# Patient Record
Sex: Male | Born: 1940 | Race: White | Hispanic: No | Marital: Married | Smoking: Never smoker
Health system: Southern US, Community
[De-identification: ages and names within clinical notes are randomized; demographics above are authoritative.]

## PROBLEM LIST (undated history)

## (undated) DIAGNOSIS — F32A Depression, unspecified: Secondary | ICD-10-CM

## (undated) DIAGNOSIS — M1990 Unspecified osteoarthritis, unspecified site: Secondary | ICD-10-CM

## (undated) DIAGNOSIS — R972 Elevated prostate specific antigen [PSA]: Secondary | ICD-10-CM

## (undated) DIAGNOSIS — Z973 Presence of spectacles and contact lenses: Secondary | ICD-10-CM

## (undated) DIAGNOSIS — C801 Malignant (primary) neoplasm, unspecified: Secondary | ICD-10-CM

## (undated) DIAGNOSIS — U071 COVID-19: Secondary | ICD-10-CM

## (undated) DIAGNOSIS — I493 Ventricular premature depolarization: Secondary | ICD-10-CM

## (undated) DIAGNOSIS — R319 Hematuria, unspecified: Secondary | ICD-10-CM

## (undated) DIAGNOSIS — D862 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: Secondary | ICD-10-CM

## (undated) DIAGNOSIS — A419 Sepsis, unspecified organism: Secondary | ICD-10-CM

## (undated) DIAGNOSIS — K219 Gastro-esophageal reflux disease without esophagitis: Secondary | ICD-10-CM

## (undated) DIAGNOSIS — J309 Allergic rhinitis, unspecified: Secondary | ICD-10-CM

## (undated) DIAGNOSIS — E785 Hyperlipidemia, unspecified: Secondary | ICD-10-CM

## (undated) DIAGNOSIS — Z9889 Other specified postprocedural states: Secondary | ICD-10-CM

## (undated) DIAGNOSIS — N401 Enlarged prostate with lower urinary tract symptoms: Secondary | ICD-10-CM

## (undated) DIAGNOSIS — M51369 Other intervertebral disc degeneration, lumbar region without mention of lumbar back pain or lower extremity pain: Secondary | ICD-10-CM

## (undated) DIAGNOSIS — J45909 Unspecified asthma, uncomplicated: Secondary | ICD-10-CM

## (undated) DIAGNOSIS — H40001 Preglaucoma, unspecified, right eye: Secondary | ICD-10-CM

## (undated) DIAGNOSIS — Z85820 Personal history of malignant melanoma of skin: Secondary | ICD-10-CM

## (undated) DIAGNOSIS — N390 Urinary tract infection, site not specified: Secondary | ICD-10-CM

## (undated) DIAGNOSIS — R7303 Prediabetes: Secondary | ICD-10-CM

## (undated) DIAGNOSIS — Z974 Presence of external hearing-aid: Secondary | ICD-10-CM

## (undated) DIAGNOSIS — R0989 Other specified symptoms and signs involving the circulatory and respiratory systems: Secondary | ICD-10-CM

## (undated) DIAGNOSIS — N529 Male erectile dysfunction, unspecified: Secondary | ICD-10-CM

## (undated) DIAGNOSIS — Z859 Personal history of malignant neoplasm, unspecified: Secondary | ICD-10-CM

## (undated) DIAGNOSIS — H903 Sensorineural hearing loss, bilateral: Secondary | ICD-10-CM

## (undated) DIAGNOSIS — M5136 Other intervertebral disc degeneration, lumbar region: Secondary | ICD-10-CM

## (undated) DIAGNOSIS — D494 Neoplasm of unspecified behavior of bladder: Secondary | ICD-10-CM

## (undated) DIAGNOSIS — N201 Calculus of ureter: Secondary | ICD-10-CM

## (undated) DIAGNOSIS — Z8709 Personal history of other diseases of the respiratory system: Secondary | ICD-10-CM

## (undated) DIAGNOSIS — R6889 Other general symptoms and signs: Secondary | ICD-10-CM

## (undated) DIAGNOSIS — F329 Major depressive disorder, single episode, unspecified: Secondary | ICD-10-CM

## (undated) HISTORY — PX: TONSILLECTOMY AND ADENOIDECTOMY: SUR1326

## (undated) HISTORY — DX: Hyperlipidemia, unspecified: E78.5

## (undated) HISTORY — PX: MOHS SURGERY: SUR867

## (undated) HISTORY — PX: ILEOSTOMY: SHX1783

## (undated) HISTORY — DX: Sepsis, unspecified organism: A41.9

## (undated) HISTORY — DX: Unspecified asthma, uncomplicated: J45.909

## (undated) HISTORY — PX: DEEP NECK LYMPH NODE BIOPSY / EXCISION: SUR126

## (undated) HISTORY — DX: Urinary tract infection, site not specified: N39.0

## (undated) HISTORY — PX: TRANSURETHRAL RESECTION OF PROSTATE: SHX73

---

## 2000-12-09 ENCOUNTER — Ambulatory Visit (HOSPITAL_BASED_OUTPATIENT_CLINIC_OR_DEPARTMENT_OTHER): Admission: RE | Admit: 2000-12-09 | Discharge: 2000-12-09 | Payer: Self-pay | Admitting: Orthopedic Surgery

## 2004-09-29 ENCOUNTER — Ambulatory Visit: Payer: Self-pay | Admitting: Internal Medicine

## 2004-10-02 ENCOUNTER — Ambulatory Visit: Payer: Self-pay | Admitting: Internal Medicine

## 2004-10-27 ENCOUNTER — Ambulatory Visit: Payer: Self-pay | Admitting: Internal Medicine

## 2004-11-10 ENCOUNTER — Encounter: Admission: RE | Admit: 2004-11-10 | Discharge: 2005-02-08 | Payer: Self-pay | Admitting: Internal Medicine

## 2005-01-05 ENCOUNTER — Ambulatory Visit: Payer: Self-pay | Admitting: Internal Medicine

## 2005-01-20 ENCOUNTER — Ambulatory Visit: Payer: Self-pay | Admitting: Internal Medicine

## 2005-02-13 ENCOUNTER — Ambulatory Visit: Payer: Self-pay | Admitting: Internal Medicine

## 2005-03-02 HISTORY — PX: PARS PLANA VITRECTOMY: SHX2166

## 2005-07-21 ENCOUNTER — Ambulatory Visit: Payer: Self-pay | Admitting: Internal Medicine

## 2006-03-02 HISTORY — PX: APPENDECTOMY: SHX54

## 2006-03-09 ENCOUNTER — Ambulatory Visit: Payer: Self-pay | Admitting: Internal Medicine

## 2006-06-29 ENCOUNTER — Ambulatory Visit: Payer: Self-pay | Admitting: Internal Medicine

## 2006-06-29 LAB — CONVERTED CEMR LAB
ALT: 25 units/L (ref 0–40)
AST: 22 units/L (ref 0–37)
Cholesterol: 159 mg/dL (ref 0–200)
Creatinine,U: 75.4 mg/dL
Hgb A1c MFr Bld: 5.7 % (ref 4.6–6.0)
Microalb, Ur: 0.3 mg/dL (ref 0.0–1.9)
Triglycerides: 114 mg/dL (ref 0–149)

## 2006-07-05 DIAGNOSIS — D869 Sarcoidosis, unspecified: Secondary | ICD-10-CM

## 2006-07-06 ENCOUNTER — Ambulatory Visit: Payer: Self-pay | Admitting: Internal Medicine

## 2006-09-28 ENCOUNTER — Ambulatory Visit: Payer: Self-pay | Admitting: Internal Medicine

## 2006-09-28 LAB — CONVERTED CEMR LAB
ALT: 42 units/L (ref 0–53)
HDL: 39.6 mg/dL (ref >39.0)
VLDL: 21 mg/dL (ref 0–40)

## 2006-10-05 ENCOUNTER — Ambulatory Visit: Payer: Self-pay | Admitting: Internal Medicine

## 2006-10-05 DIAGNOSIS — E7800 Pure hypercholesterolemia, unspecified: Secondary | ICD-10-CM | POA: Insufficient documentation

## 2006-10-05 DIAGNOSIS — E785 Hyperlipidemia, unspecified: Secondary | ICD-10-CM

## 2006-10-05 LAB — CONVERTED CEMR LAB
Cholesterol, target level: 200 mg/dL
LDL Goal: 160 mg/dL

## 2006-10-28 ENCOUNTER — Telehealth: Payer: Self-pay | Admitting: Internal Medicine

## 2006-11-12 ENCOUNTER — Encounter: Payer: Self-pay | Admitting: Internal Medicine

## 2006-12-30 ENCOUNTER — Telehealth (INDEPENDENT_AMBULATORY_CARE_PROVIDER_SITE_OTHER): Payer: Self-pay | Admitting: *Deleted

## 2007-02-03 ENCOUNTER — Ambulatory Visit: Payer: Self-pay | Admitting: Internal Medicine

## 2007-04-04 ENCOUNTER — Encounter: Payer: Self-pay | Admitting: Internal Medicine

## 2007-04-11 ENCOUNTER — Ambulatory Visit: Payer: Self-pay | Admitting: Internal Medicine

## 2007-04-11 LAB — CONVERTED CEMR LAB
ALT: 23 units/L (ref 0–53)
VLDL: 19 mg/dL (ref 0–40)

## 2007-04-18 ENCOUNTER — Ambulatory Visit: Payer: Self-pay | Admitting: Internal Medicine

## 2007-04-18 DIAGNOSIS — I776 Arteritis, unspecified: Secondary | ICD-10-CM | POA: Insufficient documentation

## 2007-04-18 LAB — CONVERTED CEMR LAB: LDL Goal: 130 mg/dL

## 2007-04-19 ENCOUNTER — Encounter: Payer: Self-pay | Admitting: Internal Medicine

## 2007-04-19 ENCOUNTER — Ambulatory Visit: Payer: Self-pay | Admitting: Family Medicine

## 2007-04-21 ENCOUNTER — Encounter (INDEPENDENT_AMBULATORY_CARE_PROVIDER_SITE_OTHER): Payer: Self-pay | Admitting: *Deleted

## 2007-04-21 ENCOUNTER — Ambulatory Visit: Payer: Self-pay | Admitting: Internal Medicine

## 2007-04-21 LAB — CONVERTED CEMR LAB
Eosinophils Absolute: 0.2 10*3/uL (ref 0.0–0.6)
Eosinophils Relative: 3.2 % (ref 0.0–5.0)
HCT: 41.3 % (ref 39.0–52.0)
MCV: 91.2 fL (ref 78.0–100.0)
Neutrophils Relative %: 63.3 % (ref 43.0–77.0)
RBC: 4.53 M/uL (ref 4.22–5.81)
WBC: 7.6 10*3/uL (ref 4.5–10.5)

## 2007-04-22 ENCOUNTER — Encounter: Payer: Self-pay | Admitting: Internal Medicine

## 2007-05-03 ENCOUNTER — Encounter (INDEPENDENT_AMBULATORY_CARE_PROVIDER_SITE_OTHER): Payer: Self-pay | Admitting: *Deleted

## 2007-05-06 ENCOUNTER — Encounter: Payer: Self-pay | Admitting: Internal Medicine

## 2007-06-02 ENCOUNTER — Ambulatory Visit: Payer: Self-pay | Admitting: Internal Medicine

## 2007-07-20 ENCOUNTER — Telehealth: Payer: Self-pay | Admitting: Internal Medicine

## 2007-08-30 ENCOUNTER — Encounter: Payer: Self-pay | Admitting: Internal Medicine

## 2007-09-27 ENCOUNTER — Encounter: Payer: Self-pay | Admitting: Internal Medicine

## 2007-12-21 ENCOUNTER — Ambulatory Visit: Payer: Self-pay | Admitting: Internal Medicine

## 2008-01-02 ENCOUNTER — Telehealth (INDEPENDENT_AMBULATORY_CARE_PROVIDER_SITE_OTHER): Payer: Self-pay | Admitting: *Deleted

## 2008-04-02 ENCOUNTER — Encounter (INDEPENDENT_AMBULATORY_CARE_PROVIDER_SITE_OTHER): Payer: Self-pay | Admitting: *Deleted

## 2008-04-12 ENCOUNTER — Ambulatory Visit: Payer: Self-pay | Admitting: Internal Medicine

## 2008-04-12 DIAGNOSIS — K1370 Unspecified lesions of oral mucosa: Secondary | ICD-10-CM

## 2008-04-12 DIAGNOSIS — F329 Major depressive disorder, single episode, unspecified: Secondary | ICD-10-CM

## 2008-04-12 DIAGNOSIS — R972 Elevated prostate specific antigen [PSA]: Secondary | ICD-10-CM

## 2008-04-12 DIAGNOSIS — J309 Allergic rhinitis, unspecified: Secondary | ICD-10-CM

## 2008-04-12 LAB — CONVERTED CEMR LAB
HDL goal, serum: 40 mg/dL
LDL Goal: 90 mg/dL

## 2008-04-13 ENCOUNTER — Encounter: Payer: Self-pay | Admitting: Internal Medicine

## 2008-04-19 ENCOUNTER — Ambulatory Visit: Payer: Self-pay | Admitting: Internal Medicine

## 2008-04-27 ENCOUNTER — Ambulatory Visit: Payer: Self-pay | Admitting: Internal Medicine

## 2008-04-27 LAB — CONVERTED CEMR LAB: OCCULT 1: NEGATIVE

## 2008-05-01 ENCOUNTER — Encounter (INDEPENDENT_AMBULATORY_CARE_PROVIDER_SITE_OTHER): Payer: Self-pay | Admitting: *Deleted

## 2008-05-07 LAB — CONVERTED CEMR LAB
Albumin: 3.8 g/dL (ref 3.5–5.2)
BUN: 16 mg/dL (ref 6–23)
Calcium: 9.4 mg/dL (ref 8.4–10.5)
Chloride: 107 meq/L (ref 96–112)
Cholesterol: 157 mg/dL (ref 0–200)
Creatinine, Ser: 0.8 mg/dL (ref 0.4–1.5)
GFR calc Af Amer: 124 mL/min
Glucose, Bld: 95 mg/dL (ref 70–99)
HDL: 40.7 mg/dL (ref >39.0)
Total CHOL/HDL Ratio: 3.9
Total Protein: 6.8 g/dL (ref 6.0–8.3)
Triglycerides: 143 mg/dL (ref 0–149)

## 2008-05-08 ENCOUNTER — Encounter (INDEPENDENT_AMBULATORY_CARE_PROVIDER_SITE_OTHER): Payer: Self-pay | Admitting: *Deleted

## 2008-05-22 ENCOUNTER — Ambulatory Visit: Payer: Self-pay | Admitting: Internal Medicine

## 2008-05-22 ENCOUNTER — Telehealth (INDEPENDENT_AMBULATORY_CARE_PROVIDER_SITE_OTHER): Payer: Self-pay | Admitting: *Deleted

## 2008-05-22 DIAGNOSIS — R0989 Other specified symptoms and signs involving the circulatory and respiratory systems: Secondary | ICD-10-CM

## 2008-05-22 DIAGNOSIS — R0609 Other forms of dyspnea: Secondary | ICD-10-CM

## 2008-05-25 ENCOUNTER — Ambulatory Visit: Payer: Self-pay | Admitting: Internal Medicine

## 2008-05-28 ENCOUNTER — Encounter (INDEPENDENT_AMBULATORY_CARE_PROVIDER_SITE_OTHER): Payer: Self-pay | Admitting: *Deleted

## 2008-06-05 ENCOUNTER — Encounter: Payer: Self-pay | Admitting: Internal Medicine

## 2008-06-05 ENCOUNTER — Ambulatory Visit: Payer: Self-pay | Admitting: Internal Medicine

## 2008-06-12 ENCOUNTER — Encounter (INDEPENDENT_AMBULATORY_CARE_PROVIDER_SITE_OTHER): Payer: Self-pay | Admitting: *Deleted

## 2008-06-12 ENCOUNTER — Telehealth (INDEPENDENT_AMBULATORY_CARE_PROVIDER_SITE_OTHER): Payer: Self-pay | Admitting: *Deleted

## 2008-07-02 ENCOUNTER — Encounter: Payer: Self-pay | Admitting: Internal Medicine

## 2008-08-02 ENCOUNTER — Telehealth: Payer: Self-pay | Admitting: Internal Medicine

## 2009-01-17 ENCOUNTER — Telehealth (INDEPENDENT_AMBULATORY_CARE_PROVIDER_SITE_OTHER): Payer: Self-pay | Admitting: *Deleted

## 2009-01-28 ENCOUNTER — Telehealth (INDEPENDENT_AMBULATORY_CARE_PROVIDER_SITE_OTHER): Payer: Self-pay | Admitting: *Deleted

## 2009-02-13 ENCOUNTER — Telehealth (INDEPENDENT_AMBULATORY_CARE_PROVIDER_SITE_OTHER): Payer: Self-pay | Admitting: *Deleted

## 2009-02-21 ENCOUNTER — Telehealth (INDEPENDENT_AMBULATORY_CARE_PROVIDER_SITE_OTHER): Payer: Self-pay | Admitting: *Deleted

## 2009-02-27 ENCOUNTER — Telehealth (INDEPENDENT_AMBULATORY_CARE_PROVIDER_SITE_OTHER): Payer: Self-pay | Admitting: *Deleted

## 2009-07-11 ENCOUNTER — Ambulatory Visit: Payer: Self-pay | Admitting: Internal Medicine

## 2009-07-11 LAB — CONVERTED CEMR LAB
BUN: 18 mg/dL (ref 6–23)
Basophils Relative: 0.8 % (ref 0.0–3.0)
Bilirubin, Direct: 0.1 mg/dL (ref 0.0–0.3)
CO2: 27 meq/L (ref 19–32)
Chloride: 109 meq/L (ref 96–112)
Cholesterol: 135 mg/dL (ref 0–200)
Creatinine, Ser: 0.7 mg/dL (ref 0.4–1.5)
Eosinophils Absolute: 0.3 10*3/uL (ref 0.0–0.7)
Eosinophils Relative: 3.3 % (ref 0.0–5.0)
Glucose, Bld: 100 mg/dL — ABNORMAL HIGH (ref 70–99)
HDL: 43.2 mg/dL (ref >39.00)
Hemoglobin: 13.4 g/dL (ref 13.0–17.0)
LDL Cholesterol: 68 mg/dL (ref 0–99)
Lymphocytes Relative: 35.8 % (ref 12.0–46.0)
MCHC: 34.4 g/dL (ref 30.0–36.0)
Neutro Abs: 3.9 10*3/uL (ref 1.4–7.7)
Potassium: 4.2 meq/L (ref 3.5–5.1)
Protein, U semiquant: NEGATIVE
RBC: 4.26 M/uL (ref 4.22–5.81)
TSH: 2.4 microintl units/mL (ref 0.35–5.50)
Total Bilirubin: 0.6 mg/dL (ref 0.3–1.2)
Total Protein: 6.6 g/dL (ref 6.0–8.3)
Urobilinogen, UA: 0.2
VLDL: 23.8 mg/dL (ref 0.0–40.0)
WBC Urine, dipstick: NEGATIVE
WBC: 7.5 10*3/uL (ref 4.5–10.5)

## 2009-07-18 ENCOUNTER — Encounter: Payer: Self-pay | Admitting: Internal Medicine

## 2009-07-18 ENCOUNTER — Ambulatory Visit: Payer: Self-pay | Admitting: Internal Medicine

## 2009-07-18 DIAGNOSIS — M7580 Other shoulder lesions, unspecified shoulder: Secondary | ICD-10-CM

## 2009-09-11 ENCOUNTER — Encounter: Payer: Self-pay | Admitting: Internal Medicine

## 2009-10-22 ENCOUNTER — Encounter: Payer: Self-pay | Admitting: Internal Medicine

## 2009-12-05 ENCOUNTER — Ambulatory Visit: Payer: Self-pay | Admitting: Internal Medicine

## 2009-12-06 ENCOUNTER — Ambulatory Visit: Payer: Self-pay | Admitting: Internal Medicine

## 2009-12-06 DIAGNOSIS — M549 Dorsalgia, unspecified: Secondary | ICD-10-CM | POA: Insufficient documentation

## 2009-12-30 ENCOUNTER — Ambulatory Visit: Payer: Self-pay | Admitting: Internal Medicine

## 2009-12-30 DIAGNOSIS — N423 Unspecified dysplasia of prostate: Secondary | ICD-10-CM | POA: Insufficient documentation

## 2009-12-31 ENCOUNTER — Ambulatory Visit: Payer: Self-pay | Admitting: Internal Medicine

## 2010-01-03 ENCOUNTER — Telehealth: Payer: Self-pay | Admitting: Internal Medicine

## 2010-02-03 ENCOUNTER — Telehealth: Payer: Self-pay | Admitting: Internal Medicine

## 2010-02-05 ENCOUNTER — Encounter
Admission: RE | Admit: 2010-02-05 | Discharge: 2010-02-05 | Payer: Self-pay | Source: Home / Self Care | Admitting: Internal Medicine

## 2010-02-06 ENCOUNTER — Encounter: Payer: Self-pay | Admitting: Internal Medicine

## 2010-02-06 DIAGNOSIS — M5137 Other intervertebral disc degeneration, lumbosacral region: Secondary | ICD-10-CM

## 2010-02-06 DIAGNOSIS — M51379 Other intervertebral disc degeneration, lumbosacral region without mention of lumbar back pain or lower extremity pain: Secondary | ICD-10-CM | POA: Insufficient documentation

## 2010-02-21 ENCOUNTER — Emergency Department (HOSPITAL_COMMUNITY)
Admission: EM | Admit: 2010-02-21 | Discharge: 2010-02-21 | Payer: Self-pay | Source: Home / Self Care | Admitting: Emergency Medicine

## 2010-02-25 ENCOUNTER — Telehealth: Payer: Self-pay | Admitting: Internal Medicine

## 2010-03-24 ENCOUNTER — Encounter: Payer: Self-pay | Admitting: Internal Medicine

## 2010-03-28 ENCOUNTER — Telehealth: Payer: Self-pay | Admitting: Internal Medicine

## 2010-04-01 NOTE — Miscellaneous (Signed)
Summary: Orders Update  Clinical Lists Changes  Problems: Added new problem of DISC DISEASE, LUMBOSACRAL SPINE (ICD-722.52) - Signed Orders: Added new Referral order of Neurosurgeon Referral (Neurosurgeon) - Signed

## 2010-04-01 NOTE — Letter (Signed)
Summary: [facility]  [facility]   Imported By: Lennie Odor 09/26/2009 11:34:14  _____________________________________________________________________  External Attachment:    Type:   Image     Comment:   External Document

## 2010-04-01 NOTE — Progress Notes (Signed)
Summary: FYI-reaction to med  Phone Note Call from Patient Call back at Work Phone [phone]   Caller: Patient Summary of Call: Spoke w/ patient says that since starting gabapentin he started feeling flushed, having some vision changes, and dizziness. Notes that he didn't take medication today and feels fine says he will just stick w/ ibruprofen since this is the second medication that he has tried and it didn't agree w/ him. Says that if Hop recommends anything else to let him know otherwise he will stick w/ ibruprofen. Initial call taken by: Doristine Devoid CMA,  January 03, 2010 2:52 PM  Follow-up for Phone Call        additional studies if pain persists or progresses Follow-up by: Marga Melnick MD,  January 03, 2010 5:42 PM  Additional Follow-up for Phone Call Additional follow up Details #1::        Patient notified of the above and states that he really is not improving. Patient will wait awhile longer and call back if needed.   Additional studies denied at this time. Additional Follow-up by: Lucious Groves CMA,  January 06, 2010 11:57 AM   New Allergies: ! * GABAPENTIN New Allergies: ! * GABAPENTIN

## 2010-04-01 NOTE — Progress Notes (Signed)
Summary: Sciatica pain  Phone Note Call from Patient Call back at Work Phone [phone]   Summary of Call: Patient called noting that he is still suffering from Sciatica. He still has pain in both his back and hip. He rates the pain as 3 of 10 b/c he has already taken some Ibuprofen this AM. Patient has done online research and would like to know if he is a candidate for Cortisone shots. Please advise. Initial call taken by: Lucious Groves CMA,  February 03, 2010 9:05 AM  Follow-up for Phone Call        MRI needed Follow-up by: Marga Melnick MD,  February 03, 2010 12:58 PM  Additional Follow-up for Phone Call Additional follow up Details #1::        PATIENT APPT IS 02-05-2010, I WILL INFORM PATIENT. Magdalen Spatz [facility]  February 03, 2010 4:45 PM

## 2010-04-01 NOTE — Assessment & Plan Note (Signed)
Summary: back pain generating down leg/cbs   Vital Signs:  Patient profile:   70 year old male Weight:      173 pounds BMI:     26.79 Temp:     98.5 degrees F oral Pulse rate:   76 / minute Resp:     15 per minute BP sitting:   106 / 64  (left arm) Cuff size:   large  Vitals Entered By: Shonna Chock CMA (December 30, 2009 3:37 PM) CC: Ongoing back pain, Back pain   CC:  Ongoing back pain and Back pain.  History of Present Illness: Back Pain      This is a 70 year old man who presents with Back pain present > 1 month. See last office visit (reviewed); muscle relaxant "made me a zombie ".No narcotic taken ; all steroids completed. . Pain is  constant , 3 -6 on 10 scale . The patient reports  associated weakness LLE , but denies fever, chills, loss of sensation, fecal incontinence, urinary incontinence, and urinary retention.  The pain is located in the left buttock & the  pain radiates to the  popliteal area.  The pain is made worse by sitting .  The pain is made better by supine & standing positions.  Risk factors for serious underlying conditions include duration of pain > 1 month and age >= 50 years.  Rx: NSAIDS with some benefit.  Current Medications (verified): 1)  Finasteride 5 Mg  Tabs (Finasteride) .Marland Kitchen.. 1 By Mouth Qd 2)  Singulair 10 Mg Tabs (Montelukast Sodium) .Marland Kitchen.. 1 By Mouth Qd 3)  Advair Diskus 250-50 Mcg/dose  Misc (Fluticasone-Salmeterol) .Marland Kitchen.. 1 Puff Once Daily Every 12 Hrs 4)  Venlafaxine Hcl 75 Mg Tabs (Venlafaxine Hcl) .... Take One Tablet Daily 5)  Pravastatin Sodium 40 Mg  Tabs (Pravastatin Sodium) .Marland Kitchen.. 1 By Mouth Qd 6)  Ventolin Hfa 108 (90 Base) Mcg/act Aers (Albuterol Sulfate) .Marland Kitchen.. 1-2 Puffs Every 4-6 Hrs As Needed For Dyspnea 7)  Vicodin 5-500 Mg Tabs (Hydrocodone-Acetaminophen) .... One By Mouth Every 4 Hours As Needed For Pain 8)  Flexeril 10 Mg Tabs (Cyclobenzaprine Hcl) .... One By Mouth At Bedtime As Needed For Pain  Allergies: 1)  ! Iodine 2)  ! Advicor  (Niacin-Lovastatin) 3)  ! 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 No AAA Msk:  He lay down & sat up w/o help Pulses:  R and L dorsalis pedis and posterior tibial pulses are full and equal bilaterally Extremities:  No clubbing, cyanosis, edema; no ischemic changes. Neg SLR Neurologic:  strength normal in all extremities,  heel & toe gait normal, and DTRs symmetrical and normal.   Skin:  Intact without suspicious lesions or rashes Psych:  memory intact for recent and remote, normally interactive, and good eye contact.     Impression & Recommendations:  Problem # 1:  BACK PAIN (ICD-724.5)  S1 radiculopathy suggested His updated medication list for this problem includes:    Vicodin 5-500 Mg Tabs (Hydrocodone-acetaminophen) ..... 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 1 by mouth qd 2)  Singulair 10 Mg Tabs (Montelukast sodium) .Marland Kitchen.. 1 by mouth qd 3)  Advair Diskus 250-50 Mcg/dose Misc (Fluticasone-salmeterol) .Marland Kitchen.. 1 puff once daily every 12 hrs 4)  Venlafaxine Hcl 75 Mg Tabs (Venlafaxine hcl) .... Take one tablet daily 5)  Pravastatin Sodium 40 Mg Tabs (Pravastatin sodium) .Marland Kitchen.. 1 by mouth  qd 6)  Ventolin Hfa 108 (90 Base) Mcg/act Aers (Albuterol sulfate) .Marland Kitchen.. 1-2 puffs every 4-6 hrs as needed for dyspnea 7)  Vicodin 5-500 Mg Tabs (Hydrocodone-acetaminophen) .... One by mouth every 4 hours as needed for pain 8)  Flexeril 10 Mg Tabs (Cyclobenzaprine hcl) .... One by mouth at bedtime as needed for pain 9)  Gabapentin 100 Mg Caps (Gabapentin) .Marland Kitchen.. 1 every 8 hrs as needed for leg pain  Patient Instructions: 1)  [facility] @ [facility] office.  Prescriptions: GABAPENTIN 100 MG CAPS (GABAPENTIN) 1 every 8 hrs as needed for leg pain  #30 x 2   Entered and Authorized by:   Marga Melnick MD   Signed by:   Marga Melnick MD on 12/30/2009   Method used:   Faxed to ...       [facility]  [address] [phone]* (retail)       [address]       [place], [place]  [address]       Ph: [phone]       Fax: [phone]   RxID:   [id number]    Orders Added: 1)  Est. Patient Level III [42706] 2)  T-Lumbar Spine w/Flex & Ext 4 Views [72120TC] 3)  Prescription Created Electronically [phone]

## 2010-04-01 NOTE — Assessment & Plan Note (Signed)
Summary: hip pain/cbs   Vital Signs:  Patient profile:   70 year old male Weight:      173 pounds Pulse rate:   78 / minute Pulse rhythm:   regular BP sitting:   116 / 70  (left arm) Cuff size:   large  Vitals Entered By: Army Fossa CMA (December 06, 2009 10:26 AM) CC: Pt here c/o hip pain that radiates down his leg.  Comments Tingling feeling x 8-10 days    History of Present Illness: 8-10 days history of lower back pain with radiation to the left buttock Pain is worse if he sits for prolonged periods of time On an off he has a tingly feeling in the whole left leg Ibuprofen helps to some extent, symptoms are worse early in the morning.  ROS Denies fevers No recent injury, no history of previous surgery in that area No bladder or bowel incontinence No rash in the back or leg He has a history of an abnormal prostate biopsy, his PSA is checked every 6 months and the last time it was checked he was told it was okay denies abdominal pain  Current Medications (verified): 1)  Finasteride 5 Mg  Tabs (Finasteride) .Marland Kitchen.. 1 By Mouth Qd 2)  Singulair 10 Mg Tabs (Montelukast Sodium) .Marland Kitchen.. 1 By Mouth Qd 3)  Advair Diskus 250-50 Mcg/dose  Misc (Fluticasone-Salmeterol) .Marland Kitchen.. 1 Puff Once Daily Every 12 Hrs 4)  Venlafaxine Hcl 75 Mg Tabs (Venlafaxine Hcl) .... Take One Tablet Daily 5)  Pravastatin Sodium 40 Mg  Tabs (Pravastatin Sodium) .Marland Kitchen.. 1 By Mouth Qd 6)  Ventolin Hfa 108 (90 Base) Mcg/act Aers (Albuterol Sulfate) .Marland Kitchen.. 1-2 Puffs Every 4-6 Hrs As Needed For Dyspnea  Allergies (verified): 1)  ! Iodine 2)  ! Advicor (Niacin-Lovastatin) 3)  ! * Shellfish  Past History:  Past Medical History: Reviewed history from 07/18/2009 and no changes required. 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 Appendectomy Tonsillectomy biopsy mediastinal  lymph nodes : sarcoidosis 1970s colonoscopy negative 1999 macular pucker ophthalmologic  surgery 2007 pre cancerous cells on prostate  biopsy times two 2007, Dr Patsi Sears , Avodart therapy  Social History: Reviewed history from 07/18/2009 and no changes required. Never Smoked Alcohol use-no Retired Married Regular exercise-yes: yard work & walking  Physical Exam  General:  alert and well-developed.   Abdomen:  soft, non-tender, no distention, no masses, no guarding, and no rigidity.   Msk:  not tender to palpation in the lower back   Extremities:  no edema Neurologic:  gait slt  antalgic Lower extremity strength is normal and symmetric Straight leg test negative Absent left ankle jerk otherwise DTRs in the legs are normal   Impression & Recommendations:  Problem # 1:  BACK PAIN (ICD-724.5)  acute back pain features consistent with an S1 radiculopathy Conservative treatment, see instructions Followup with PCP in 2 weeks, further workup may be indicated if he's not improving or if he gets worse. Patient knows to call if symptoms increase  His updated medication list for this problem includes:    Vicodin 5-500 Mg Tabs (Hydrocodone-acetaminophen) ..... One by mouth every 4 hours as needed for pain    Flexeril 10 Mg Tabs (Cyclobenzaprine hcl) ..... One by mouth at bedtime as needed for pain  Complete Medication  List: 1)  Finasteride 5 Mg Tabs (Finasteride) .Marland Kitchen.. 1 by mouth qd 2)  Singulair 10 Mg Tabs (Montelukast sodium) .Marland Kitchen.. 1 by mouth qd 3)  Advair Diskus 250-50 Mcg/dose Misc (Fluticasone-salmeterol) .Marland Kitchen.. 1 puff once daily every 12 hrs 4)  Venlafaxine Hcl 75 Mg Tabs (Venlafaxine hcl) .... Take one tablet daily 5)  Pravastatin Sodium 40 Mg Tabs (Pravastatin sodium) .Marland Kitchen.. 1 by mouth qd 6)  Ventolin Hfa 108 (90 Base) Mcg/act Aers (Albuterol sulfate) .Marland Kitchen.. 1-2 puffs  every 4-6 hrs as needed for dyspnea 7)  Prednisone 10 Mg Tabs (Prednisone) .... 3 by mouth once daily x 3, 2x3,1x3 8)  Vicodin 5-500 Mg Tabs (Hydrocodone-acetaminophen) .... One by mouth every 4 hours as needed for pain 9)  Flexeril 10 Mg Tabs (Cyclobenzaprine hcl) .... One by mouth at bedtime as needed for pain  Patient Instructions: 1)  rest, no heavy lifting 2)  local heat 3)  take  medication as prescribed, Vicodin and Flexeril will cause drowsiness 4)  come back and see Dr. Alwyn Ren  in 2 weeks 5)  Call anytime if   symptoms get worse Prescriptions: PREDNISONE 10 MG TABS (PREDNISONE) 3 by mouth once daily x 3, 2x3,1x3  #18 x 0   Entered and Authorized by:   Nolon Rod. Franchelle Foskett MD   Signed by:   Nolon Rod. Nanie Dunkleberger MD on 12/06/2009   Method used:   Print then Give to Patient   RxID:   [id number] FLEXERIL 10 MG TABS (CYCLOBENZAPRINE HCL) one by mouth at bedtime as needed for pain  #21 x 0   Entered and Authorized by:   Nolon Rod. Essa Malachi MD   Signed by:   Nolon Rod. Joshue Badal MD on 12/06/2009   Method used:   Print then Give to Patient   RxID:   [id number] VICODIN 5-500 MG TABS (HYDROCODONE-ACETAMINOPHEN) one by mouth every 4 hours as needed for pain  #30 x 0   Entered and Authorized by:   Nolon Rod. Masin Shatto MD   Signed by:   Nolon Rod. Clark Cuff MD on 12/06/2009   Method used:   Print then Give to Patient   RxID:   [id number]

## 2010-04-01 NOTE — Assessment & Plan Note (Signed)
Summary: FLU SHOT, [facility] PT/RH........  Nurse Visit   Allergies: 1)  ! Iodine 2)  ! Advicor (Niacin-Lovastatin) 3)  ! * Shellfish  Orders Added: 1)  Flu Vaccine 51yrs + MEDICARE PATIENTS [Q2039] 2)  Administration Flu vaccine - MCR [G0008]            Flu Vaccine Consent Questions     Do you have a history of severe allergic reactions to this vaccine? no    Any prior history of allergic reactions to egg and/or gelatin? no    Do you have a sensitivity to the preservative Thimersol? no    Do you have a past history of Guillan-Barre Syndrome? no    Do you currently have an acute febrile illness? no    Have you ever had a severe reaction to latex? no    Vaccine information given and explained to patient? yes    Are you currently pregnant? no    Lot Number:[id number]   Exp Date:08/30/2010   Site Given  Left Deltoid IMu

## 2010-04-01 NOTE — Assessment & Plan Note (Signed)
Summary: CPX, LABS PRIOR, MEDICARE & BCBS/RH.....   Vital Signs:  Patient profile:   70 year old male Height:      67.5 inches Weight:      173 pounds BMI:     26.79 Temp:     97.9 degrees F oral Pulse rate:   66 / minute Resp:     14 per minute BP sitting:   110 / 70  (left arm) Cuff size:   large  Vitals Entered By: Shonna Chock (Jul 18, 2009 10:39 AM) CC: CPX and discuss labs(copy given) Comments REVIEWED MED LIST, PATIENT AGREED DOSE AND INSTRUCTION CORRECT    CC:  CPX and discuss labs(copy given).  History of Present Illness: Here for Medicare/BCBS Annual Wellness exam ;risk factors addressed( see PMH,S & FH  updates & ROS).DOE  is stable & does not limit ADL.No anxiety/ depression.Hearing aids worn.House safety proofed. Immunizations, Living Will/POA  up to date.  Allergies: 1)  ! Iodine 2)  ! Advicor (Niacin-Lovastatin) 3)  ! 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, hx of, Dr Marcello Fennel  Past Surgical History: Appendectomy Tonsillectomy biopsy mediastinal  lymph nodes : sarcoidosis 1970s colonoscopy negative 1999 macular pucker ophthalmologic  surgery 2007 pre cancerous cells on prostate  biopsy times two 2007, Dr Patsi Sears , Avodart therapy  Family History: maternal grandmother breast cancer, MI died @  age 49 paternal grandmother cancer site unknown paternal aunt  breast cancer  paternal uncles prostate cancer mother angina , CVA  maternal grandfather CVA died  @ age 75 paternal grandfather MI @ 42 father d septic shock post vertebral fracture, Foley cath  Social History: Never Smoked Alcohol use-no Retired Married Regular exercise-yes: yard work & walking  Review of Systems       The patient complains of dyspnea on exertion.  The patient denies  anorexia, fever, weight loss, weight gain, hoarseness, chest pain, syncope, peripheral edema, headaches, abdominal pain, melena, hematochezia, severe indigestion/heartburn, suspicious skin lesions, depression, unusual weight change, abnormal bleeding, enlarged lymph nodes, and angioedema.         Vision & hearing losses stable. DOE after running 1/2 block. Depression controlled with meds.Neti pot has resolved sinus congestion  issues.Occasional L>R shoulder & R wrist pain. Rx: none. Resp:  Denies chest pain with inspiration, cough, coughing up blood, and sputum productive. GU:  Denies discharge, dysuria, hematuria, and urinary hesitancy; Nocturia X 1. Dr Marcello Fennel seen annually.Marland Kitchen  Physical Exam  General:  Appears younger than age,in no acute distress; alert,appropriate and cooperative throughout examination Head:  Normocephalic and atraumatic without obvious abnormalities. No apparent alopecia  Eyes:  No corneal or conjunctival inflammation noted. OD pupil distorted Funduscopic exam benign, without hemorrhages, exudates or papilledema.  Ears:  External ear exam shows no significant lesions or deformities.  Otoscopic examination reveals clear canals, tympanic membranes are intact bilaterally without bulging, retraction, inflammation or discharge. Hearing is grossly decreased  bilaterally(not wearing aids). Nose:  External nasal examination shows no deformity or inflammation. Nasal mucosa are pink and moist without lesions or exudates. Mouth:  Oral mucosa and oropharynx without lesions or exudates.  Teeth in good repair. Neck:  No deformities, masses, or tenderness noted. Lungs:  Normal respiratory effort, chest expands symmetrically. Lungs are clear to auscultation, no crackles or wheezes. Heart:  Normal rate and regular rhythm. S1 and S2  normal without gallop, murmur, click, rub. S4 Abdomen:  Bowel sounds positive,abdomen soft and non-tender without masses, organomegaly. Small ventral  hernia   noted. Genitalia:  Dr Marcello Fennel Msk:  No deformity or scoliosis noted of thoracic or lumbar spine.   Pulses:  R and L carotid,radial,dorsalis pedis and posterior tibial pulses are full and equal bilaterally Extremities:  No clubbing, cyanosis, edema, or deformity noted with normal full range of motion of all joints.  Mild crepitus of L > R shoulder with ROM Neurologic:  alert & oriented X3 and DTRs symmetrical and normal.   Skin:  Bruising over dorsum of hands Cervical Nodes:  No lymphadenopathy noted Axillary Nodes:  No palpable lymphadenopathy Psych:  Oriented X3, memory intact for recent and remote, normally interactive, good eye contact, not anxious appearing, and not depressed appearing.     Impression & Recommendations:  Problem # 1:  HEALTH MAINTENANCE EXAM (ICD-V70.0)  Orders: Subsequent annual wellness visit with prevention plan (D6387) EKG w/ Interpretation (93000)  Problem # 2:  DYSPNEA/SHORTNESS OF BREATH (ICD-786.09)  stable His updated medication list for this problem includes:    Singulair 10 Mg Tabs (Montelukast sodium) .Marland Kitchen... 1 by mouth qd    Advair Diskus 250-50 Mcg/dose Misc (Fluticasone-salmeterol) .Marland Kitchen... 1 puff once daily every 12 hrs    Ventolin Hfa 108 (90 Base) Mcg/act Aers (Albuterol sulfate) .Marland Kitchen... 1-2 puffs every 4-6 hrs as needed for dyspnea  Orders: Subsequent annual wellness visit with prevention plan (F6433)  Problem # 3:  ALLERGIC RHINITIS (ICD-477.9)  controlled with Neti pot The following medications were removed from the medication list:    Astelin 137 Mcg/spray Soln (Azelastine hcl) .Marland Kitchen... 1 spray each nostril qd  Orders: Subsequent annual wellness visit with prevention plan (I9518)  Problem # 4:  PROSTATE CANCER, HX OF (ICD-V10.46)  as per Dr Marcello Fennel  Orders: Subsequent annual wellness visit with prevention plan (A4166)  Problem # 5:  DEPRESSION (ICD-311)  controlled His updated medication list for this problem includes:     Venlafaxine Hcl 75 Mg Tabs (Venlafaxine hcl) .Marland Kitchen... Take one tablet daily  Orders: Subsequent annual wellness visit with prevention plan (A6301)  Problem # 6:  HYPERLIPIDEMIA NEC/NOS (ICD-272.4)  His updated medication list for this problem includes:    Pravastatin Sodium 40 Mg Tabs (Pravastatin sodium) .Marland Kitchen... 1 by mouth qd  Orders: Subsequent annual wellness visit with prevention plan (S0109)  Complete Medication List: 1)  Finasteride 5 Mg Tabs (Finasteride) .Marland Kitchen.. 1 by mouth qd 2)  Singulair 10 Mg Tabs (Montelukast sodium) .Marland Kitchen.. 1 by mouth qd 3)  Advair Diskus 250-50 Mcg/dose Misc (Fluticasone-salmeterol) .Marland Kitchen.. 1 puff once daily every 12 hrs 4)  Venlafaxine Hcl 75 Mg Tabs (Venlafaxine hcl) .... Take one tablet daily 5)  Pravastatin Sodium 40 Mg Tabs (Pravastatin sodium) .Marland Kitchen.. 1 by mouth qd 6)  Ventolin Hfa 108 (90 Base) Mcg/act Aers (Albuterol sulfate) .Marland Kitchen.. 1-2 puffs every 4-6 hrs as needed for dyspnea  Patient Instructions: 1)  Please consider scheduling  a colonoscopy  to help detect colon cancer.Choose your Health care Power of Attorney and/or prepare a Living Will.

## 2010-04-03 NOTE — Progress Notes (Signed)
Summary: NEUROSURGEON REFERRAL  Phone Note From Other Clinic   Summary of Call: IN REFERENCE TO NEUROSURGERY REFERRAL.....Marland KitchenPER FAX FROM [facility], [place] CALLED THEM & CANCELLED HIS APPT & DID NOT [facility] Magdalen Spatz [facility]  March 28, 2010 10:40 AM   Follow-up for Phone Call        noted Follow-up by: Marga Melnick MD,  March 28, 2010 4:39 PM

## 2010-04-03 NOTE — Progress Notes (Signed)
Summary: Hand issue  Phone Note Call from Patient Call back at Work Phone [phone]   Summary of Call: Patient left message on triage that was hard to understand. Something about a hand injury. I called patient for more info and mailbox is full. Must try again later. Lucious Groves CMA  February 25, 2010 2:29 PM   I spoke with the patient and he noted that he already spoke with a nurse and will be fine. Lucious Groves CMA  February 25, 2010 4:00 PM

## 2010-04-17 NOTE — Letter (Signed)
Summary: Patient cancelled appt./[facility]  Patient cancelled appt./[facility]   Imported By: Maryln Gottron 04/07/2010 14:13:55  _____________________________________________________________________  External Attachment:    Type:   Image     Comment:   External Document

## 2010-04-23 ENCOUNTER — Encounter: Payer: Self-pay | Admitting: Internal Medicine

## 2010-04-23 ENCOUNTER — Ambulatory Visit (INDEPENDENT_AMBULATORY_CARE_PROVIDER_SITE_OTHER): Payer: Medicare Other | Admitting: Internal Medicine

## 2010-04-23 DIAGNOSIS — N453 Epididymo-orchitis: Secondary | ICD-10-CM

## 2010-04-23 DIAGNOSIS — F528 Other sexual dysfunction not due to a substance or known physiological condition: Secondary | ICD-10-CM

## 2010-04-24 ENCOUNTER — Telehealth: Payer: Self-pay | Admitting: Internal Medicine

## 2010-04-25 ENCOUNTER — Encounter (INDEPENDENT_AMBULATORY_CARE_PROVIDER_SITE_OTHER): Payer: Self-pay | Admitting: *Deleted

## 2010-04-25 ENCOUNTER — Other Ambulatory Visit (INDEPENDENT_AMBULATORY_CARE_PROVIDER_SITE_OTHER): Payer: Medicare Other

## 2010-04-25 ENCOUNTER — Other Ambulatory Visit: Payer: Self-pay | Admitting: Internal Medicine

## 2010-04-25 DIAGNOSIS — E039 Hypothyroidism, unspecified: Secondary | ICD-10-CM

## 2010-04-25 LAB — TSH: TSH: 2.07 u[IU]/mL (ref 0.35–5.50)

## 2010-04-29 ENCOUNTER — Encounter: Payer: Self-pay | Admitting: Internal Medicine

## 2010-04-29 ENCOUNTER — Other Ambulatory Visit: Payer: Self-pay | Admitting: Internal Medicine

## 2010-04-29 ENCOUNTER — Ambulatory Visit (INDEPENDENT_AMBULATORY_CARE_PROVIDER_SITE_OTHER): Payer: Medicare Other | Admitting: Internal Medicine

## 2010-04-29 DIAGNOSIS — I4949 Other premature depolarization: Secondary | ICD-10-CM

## 2010-04-29 LAB — BASIC METABOLIC PANEL
CO2: 27 mEq/L (ref 19–32)
Chloride: 103 mEq/L (ref 96–112)
Potassium: 4.7 mEq/L (ref 3.5–5.1)
Sodium: 138 mEq/L (ref 135–145)

## 2010-04-29 LAB — CBC WITH DIFFERENTIAL/PLATELET
Basophils Absolute: 0.1 10*3/uL (ref 0.0–0.1)
Hemoglobin: 15.4 g/dL (ref 13.0–17.0)
Lymphocytes Relative: 29.6 % (ref 12.0–46.0)
Monocytes Relative: 6.1 % (ref 3.0–12.0)
Neutrophils Relative %: 61.3 % (ref 43.0–77.0)
Platelets: 343 10*3/uL (ref 150.0–400.0)
RDW: 12.5 % (ref 11.5–14.6)

## 2010-04-29 LAB — MAGNESIUM: Magnesium: 2.2 mg/dL (ref 1.5–2.5)

## 2010-04-29 NOTE — Progress Notes (Signed)
Summary: Pulse rate  Phone Note Call from Patient Call back at Home Phone [phone] Call back at Work Phone [phone] Call back at try cell (work) first   Summary of Call: Patient called noting that his pulse was 51 yesterday and he checked it himself today and it was 46 at rest and goes to 90 with walking. He notes that he is concerned, but feels fine and that his pulse is usually in the 70s. Please advise. Initial call taken by: Lucious Groves CMA,  April 24, 2010 11:46 AM  Follow-up for Phone Call        on no meds to cause this; if persistent have TSH drawn (Code: Bradycardia)  Additional Follow-up for Phone Call Additional follow up Details #1::        Patient notified of the above and states that he has been sitting for 20 min and his pulse his 89. Patient will come for TSH in the AM. Additional Follow-up by: Lucious Groves CMA,  April 24, 2010 2:48 PM

## 2010-04-29 NOTE — Assessment & Plan Note (Signed)
Summary: pain /groin./cbs   Vital Signs:  Patient profile:   70 year old male Weight:      172 pounds BMI:     26.64 Temp:     97.8 degrees F oral Pulse rate:   52 / minute Resp:     15 per minute BP sitting:   108 / 64  (left arm) Cuff size:   large  Vitals Entered By: Shonna Chock CMA (April 23, 2010 9:35 AM) CC: Pain-groin area , Abdominal pain   CC:  Pain-groin area  and Abdominal pain.  History of Present Illness:      This is a 70 year old man who presents with  L scrotal  pain for 1 week.  The patient reports nausea, but denies vomiting, diarrhea, constipation, and melena.   The pain is described as constant, sharp  when walking  and dull @rest .  The patient denies the following symptoms: fever, dysuria, hematuria, discharge  and dark urine.   No Rx to date. PMH of epididymitis.  Current Medications (verified): 1)  Finasteride 5 Mg  Tabs (Finasteride) .Marland Kitchen.. 1 By Mouth Qd 2)  Singulair 10 Mg Tabs (Montelukast Sodium) .Marland Kitchen.. 1 By Mouth Qd 3)  Advair Diskus 250-50 Mcg/dose  Misc (Fluticasone-Salmeterol) .Marland Kitchen.. 1 Puff Once Daily Every 12 Hrs 4)  Venlafaxine Hcl 75 Mg Tabs (Venlafaxine Hcl) .... Take One Tablet Daily 5)  Pravastatin Sodium 40 Mg  Tabs (Pravastatin Sodium) .Marland Kitchen.. 1 By Mouth Qd 6)  Ventolin Hfa 108 (90 Base) Mcg/act Aers (Albuterol Sulfate) .Marland Kitchen.. 1-2 Puffs Every 4-6 Hrs As Needed For Dyspnea  Allergies: 1)  ! Iodine 2)  ! Advicor (Niacin-Lovastatin) 3)  ! * Gabapentin 4)  ! * Shellfish  Review of Systems GU:  Complains of erectile dysfunction; ED ? from Avodart ; Viagra requested . Dr Marcello Fennel seen annually.  Physical Exam  General:  well-nourished,in no acute distress; alert,appropriate and cooperative throughout examination Genitalia:  Testes bilaterally descended without nodularity, marked tenderness  L epididymal area w/o  masses. No penis lesions or urethral discharge. Skin:  Intact without suspicious lesions or rashes Inguinal Nodes:  No significant  adenopathy   Impression & Recommendations:  Problem # 1:  EPIDIDYMITIS, LEFT (ICD-604.90)  Orders: Prescription Created Electronically [phone])  Problem # 2:  ERECTILE DYSFUNCTION (ICD-302.72)  ? Iatrogenic from Avodart  His updated medication list for this problem includes:    Viagra 50 Mg Tabs (Sildenafil citrate) .Marland Kitchen... 1-2 once daily as needed  Complete Medication List: 1)  Finasteride 5 Mg Tabs (Finasteride) .Marland Kitchen.. 1 by mouth qd 2)  Singulair 10 Mg Tabs (Montelukast sodium) .Marland Kitchen.. 1 by mouth qd 3)  Advair Diskus 250-50 Mcg/dose Misc (Fluticasone-salmeterol) .Marland Kitchen.. 1 puff once daily every 12 hrs 4)  Venlafaxine Hcl 75 Mg Tabs (Venlafaxine hcl) .... Take one tablet daily 5)  Pravastatin Sodium 40 Mg Tabs (Pravastatin sodium) .Marland Kitchen.. 1 by mouth qd 6)  Ventolin Hfa 108 (90 Base) Mcg/act Aers (Albuterol sulfate) .Marland Kitchen.. 1-2 puffs every 4-6 hrs as needed for dyspnea 7)  Ciprofloxacin Hcl 500 Mg Tabs (Ciprofloxacin hcl) .Marland Kitchen.. 1 two times a day 8)  Viagra 50 Mg Tabs (Sildenafil citrate) .Marland Kitchen.. 1-2 once daily as needed  Patient Instructions: 1)  Sitz baths 2-3 Avoid spicy foods & alcohol Prescriptions: VIAGRA 50 MG TABS (SILDENAFIL CITRATE) 1-2 once daily as needed  #4 x 0   Entered and Authorized by:   Marga Melnick MD   Signed by:   Marga Melnick MD on 04/23/2010  Method used:   Samples Given   RxID:   [id number] CIPROFLOXACIN HCL 500 MG TABS (CIPROFLOXACIN HCL) 1 two times a day  #20 x 0   Entered and Authorized by:   Marga Melnick MD   Signed by:   Marga Melnick MD on 04/23/2010   Method used:   Electronically to        [id number] (retail)       [address]       [place], [place]  [address]       Ph: [phone]       Fax: [phone]   RxID:   [id number]    Orders Added: 1)  Est. Patient Level III [73220] 2)  Prescription Created Electronically [id number]

## 2010-05-08 NOTE — Assessment & Plan Note (Signed)
Summary: C/O bradycardia issues/kb   Vital Signs:  Patient profile:   70 year old male Weight:      173.2 pounds BMI:     26.82 Temp:     98.2 degrees F oral Pulse rate:   89 / minute Resp:     15 per minute BP sitting:   102 / 60  (left arm) Cuff size:   large  Vitals Entered By: Shonna Chock CMA (April 29, 2010 11:28 AM)  CC:  Palpitations.  History of Present Illness:      This is a 70 year old man who presents with change in heart rate over past week.  The patient reports dizziness, chest tightness, and shortness of breath, but denies presyncope, syncope, and throat tightness.  The patient denies the following symptoms: blurred vision, numbness, weakness, diaphoresis, and nausea ( but "queasy").  This is  described as a sensation of the heart beating slowly. This is  sudden in onset, intermittent, occur daily, and occur at rest.No relationship to exercise. There is no trigger or predisposition. He rarely uses Ventolin MDI; it has not since this started.He denies other stimulants except tea 2-3 glasses/ day.TSH was 2.07 on 04/25/2010.  Current Medications (verified): 1)  Finasteride 5 Mg  Tabs (Finasteride) .Marland Kitchen.. 1 By Mouth Qd 2)  Singulair 10 Mg Tabs (Montelukast Sodium) .Marland Kitchen.. 1 By Mouth Qd 3)  Advair Diskus 250-50 Mcg/dose  Misc (Fluticasone-Salmeterol) .Marland Kitchen.. 1 Puff Once Daily Every 12 Hrs 4)  Venlafaxine Hcl 75 Mg Tabs (Venlafaxine Hcl) .... Take One Tablet Daily 5)  Pravastatin Sodium 40 Mg  Tabs (Pravastatin Sodium) .Marland Kitchen.. 1 By Mouth Qd 6)  Ventolin Hfa 108 (90 Base) Mcg/act Aers (Albuterol Sulfate) .Marland Kitchen.. 1-2 Puffs Every 4-6 Hrs As Needed For Dyspnea 7)  Viagra 50 Mg Tabs (Sildenafil Citrate) .Marland Kitchen.. 1-2 Once Daily As Needed  Allergies: 1)  ! Iodine 2)  ! Advicor (Niacin-Lovastatin) 3)  ! * Gabapentin 4)  ! * Shellfish  Review of Systems Psych:  Denies anxiety, depression, and panic attacks.  Physical Exam  General:  well-nourished,in no acute distress; alert,appropriate and  cooperative throughout examination Eyes:  No corneal or conjunctival inflammation noted. EOMI. No lid lag Neck:  No deformities, masses, or tenderness noted. Lungs:  Normal respiratory effort, chest expands symmetrically. Lungs are clear to auscultation, no crackles or wheezes. Heart:  bradycardia and irregular rhythm.   Pulses:  R and L carotid,radial,dorsalis pedis and posterior tibial pulses are full and equal bilaterally Extremities:  No clubbing, cyanosis, edema. No onycholysis.   Neurologic:  alert & oriented X3 and DTRs symmetrical and normal.  No tremor  Skin:  Intact without suspicious lesions or rashes Psych:  memory intact for recent and remote, normally interactive, good eye contact, and not anxious appearing.     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 1 by mouth qd 2)  Singulair 10 Mg Tabs (Montelukast sodium) .Marland Kitchen.. 1 by mouth qd 3)  Advair Diskus 250-50 Mcg/dose Misc (Fluticasone-salmeterol) .Marland Kitchen.. 1 puff once daily every 12 hrs 4)  Venlafaxine Hcl 75 Mg Tabs (Venlafaxine hcl) .... Take one tablet daily 5)  Pravastatin Sodium 40 Mg Tabs (Pravastatin sodium) .Marland Kitchen.. 1 by mouth qd 6)  Ventolin Hfa  108 (90 Base) Mcg/act Aers (Albuterol sulfate) .Marland Kitchen.. 1-2 puffs every 4-6 hrs as needed for dyspnea 7)  Viagra 50 Mg Tabs (Sildenafil citrate) .Marland Kitchen.. 1-2 once daily as needed  Patient Instructions: 1)  Avoid excess stimulants as discussed   Orders Added: 1)  Est. Patient Level IV [04540] 2)  EKG w/ Interpretation [93000] 3)  Venipuncture [36415] 4)  TLB-CBC Platelet - w/Differential [85025-CBCD] 5)   TLB-BMP (Basic Metabolic Panel-BMET) [80048-METABOL] 6)  TLB-Magnesium (Mg) [83735-MG] 7)  Cardiology Referral [Cardiology]

## 2010-05-13 ENCOUNTER — Encounter: Payer: Self-pay | Admitting: Internal Medicine

## 2010-05-13 ENCOUNTER — Institutional Professional Consult (permissible substitution) (INDEPENDENT_AMBULATORY_CARE_PROVIDER_SITE_OTHER): Payer: Medicare Other | Admitting: Internal Medicine

## 2010-05-13 DIAGNOSIS — R072 Precordial pain: Secondary | ICD-10-CM

## 2010-05-13 DIAGNOSIS — I4949 Other premature depolarization: Secondary | ICD-10-CM

## 2010-05-13 NOTE — Letter (Signed)
Summary: Retinal Testing/[facility]  Retinal Testing/[facility]   Imported By: Maryln Gottron 05/09/2010 13:53:19  _____________________________________________________________________  External Attachment:    Type:   Image     Comment:   External Document

## 2010-05-20 NOTE — Assessment & Plan Note (Signed)
Summary: consult: premature venticular contraction.per renee office 54...   Primary Provider:  Marga Melnick MD   History of Present Illness: Adrian Rios is seen at the request of Dr. Alwyn Ren because of PVCs.  He is a 70 year old gentleman who about 3 weeks ago noted episodes of chest tightness associated with shortness of breath. This occurred initially while walking. He checked his O2 sat machine when he got home and noticed that his heart rate was in the 40s instead of the normal 70s. He has had a number of these episodes since. They are not necessarily related to exertion. They awaken him occasionally at night. He was seen by Dr. Alwyn Ren because of this. At that time he turned out to be in ventricular bigeminy.  These episodes of bigeminy has been episodic. He had a "bad day" last Friday. They have been quite quiet over the last 72 hours.  He has no positive exercise intolerance apart from these episodes. Cardiac risk factors are negative, hypertension, cigarettes, and diabetes. He has treated dyslipidemia and his mother had her first heart attack at the age of 25  Current Medications (verified): 1)  Finasteride 5 Mg  Tabs (Finasteride) .Marland Kitchen.. 1 By Mouth Qd 2)  Singulair 10 Mg Tabs (Montelukast Sodium) .Marland Kitchen.. 1 By Mouth Qd 3)  Advair Diskus 250-50 Mcg/dose  Misc (Fluticasone-Salmeterol) .Marland Kitchen.. 1 Puff Once Daily Every 12 Hrs 4)  Venlafaxine Hcl 75 Mg Tabs (Venlafaxine Hcl) .... Take One Tablet Daily 5)  Pravastatin Sodium 40 Mg  Tabs (Pravastatin Sodium) .Marland Kitchen.. 1 By Mouth Qd 6)  Ventolin Hfa 108 (90 Base) Mcg/act Aers (Albuterol Sulfate) .Marland Kitchen.. 1-2 Puffs Every 4-6 Hrs As Needed For Dyspnea 7)  Viagra 50 Mg Tabs (Sildenafil Citrate) .Marland Kitchen.. 1-2 Once Daily As Needed  Allergies (verified): 1)  ! Iodine 2)  ! Advicor (Niacin-Lovastatin) 3)  ! * Gabapentin 4)  ! 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), PMH  of, Dr Marcello Fennel  Past Surgical History: Last updated: 07/18/2009 Appendectomy Tonsillectomy biopsy mediastinal  lymph nodes : sarcoidosis 1970s colonoscopy negative 1999 macular pucker ophthalmologic  surgery 2007 pre cancerous cells on prostate  biopsy times two 2007, Dr Patsi Sears , Avodart therapy  Family History: Last updated: 07/18/2009 maternal grandmother breast cancer, MI died @  age 65 paternal grandmother cancer site unknown paternal aunt  breast cancer  paternal uncles prostate cancer mother angina , CVA  maternal grandfather CVA died  @ age 20 paternal grandfather MI @ 85 father d septic shock post vertebral fracture, Foley cath  Social History: Last updated: 07/18/2009 Never Smoked Alcohol use-no Retired Married Regular exercise-yes: yard work & walking  Review of Systems       full review of systems was negative apart from a history of present illness and past medical history.   Vital Signs:  Patient profile:   70 year old male Height:      67.5 inches Weight:      174 pounds BMI:     26.95 Pulse rate:   77 / minute Pulse rhythm:   regular BP sitting:   122 / 72  (right arm) Cuff size:   regular  Vitals Entered By: Judithe Modest CMA (May 13, 2010 2:24 PM)  Physical Exam  General:  well-developed and well-nourished older Caucasian male appearing his  stated agee He is  in no acute distress. HEENT  normal . Neck veins were flat; carotids brisk and full without bruits. No lymphadenopathy. Back without kyphosis. Lungs clear. Heart sounds regular without murmurs or gallops. PMI nondisplaced. Abdomen soft with active bowel sounds without midline pulsation or hepatomegaly. Femoral pulses and distal pulses intact. Extremities were without clubbing cyanosis or edemaSkin warm and dry. 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 I cannot be totally sure that they're not PACs as the initial the flexion is concordant with sinus on a number of the leads. The most intriguing issue is his history of sarcoid which involved his lungs. I think is important exclude that he has any remnant of cardiac sarcoidosis can be very arrhythmogenic. To that end we'll plan to undertake a cardiac MRI to exclude its presence.  we will also give him verapamil to take on an as-needed basis 40 mg and we'll see how that works. The left on beta blockers initially because of his lung issues. In thenEvent that these are insufficient and the symptoms are sufficiently frequent; we would consider antiarrhythmic therapy. Myoview scan would be important in this regard (see below)  Problem # 2:  DYSPNEA/SHORTNESS OF BREATH (ICD-786.09)  the patient is also having chest tightness or shortness of breath. In his mind these are related to the PVCs. However, given his risk factors and his age I think excluding coronary disease is important. We will undertake a Myoview scan  Orders: Nuclear Stress Test (Nuc Stress Test)  Other Orders: Cardiac MRI (Cardiac MRI)  Patient Instructions: 1)  Your physician recommends that you schedule a follow-up appointment in: pending test resulst 2)  Your physician recommends that you continue on your current medications as directed. Please refer to the Current Medication list given to you today. 3)  Your physician has requested that you have an exercise stress myoview.  For further information please visit [email or web address]  Please follow instruction sheet, as  given. 4)  Your physician has requested that you have a cardiac MRI.  Cardiac MRI uses a computer to create images of your heart as it's beating, producing both still and moving pictures of your heart and major blood vessels. For further information please visit  [email or web address]  Please follow the instruction sheet given to you today for more information.

## 2010-05-23 ENCOUNTER — Telehealth: Payer: Self-pay | Admitting: Internal Medicine

## 2010-05-23 DIAGNOSIS — I493 Ventricular premature depolarization: Secondary | ICD-10-CM

## 2010-05-23 MED ORDER — VERAPAMIL HCL 40 MG PO TABS: 40.0000 mg | ORAL_TABLET | ORAL | Status: DC | PRN

## 2010-05-23 NOTE — Telephone Encounter (Signed)
Spoke to patient-states he has been having more frequent arrythmias since Wed with SOB and tightness in chest.  Feels better at this moment.  Dr. Graciela Husbands, in his progress note from 05/13/10 ordered Verapamil 40mg  to be used on a as needed basis.  No Rx was called in for this patient.  Patient is scheduled for Cardiac MRI and Stress test on Tuesday 05/27/10.  Will ask DOD Dr. Myrtis Ser about Rx for [facility].

## 2010-05-23 NOTE — Telephone Encounter (Signed)
Dr. Myrtis Ser ordered Verapamil 40mg  as needed for arrythmias.  This Rx sent to [facility] as per patient.  Called patient back and informed him Rx was called in.  States he is feeling worse now and will go straight to pharmacy and pick up medication.  Advised patient if he has any chest pain or increased SOB to go directly to ED.  Voiced understanding.

## 2010-05-26 ENCOUNTER — Other Ambulatory Visit: Payer: Self-pay | Admitting: Internal Medicine

## 2010-05-27 ENCOUNTER — Ambulatory Visit (HOSPITAL_COMMUNITY): Payer: Medicare Other | Attending: Internal Medicine | Admitting: Radiology

## 2010-05-27 DIAGNOSIS — R0989 Other specified symptoms and signs involving the circulatory and respiratory systems: Secondary | ICD-10-CM | POA: Insufficient documentation

## 2010-05-27 DIAGNOSIS — R0789 Other chest pain: Secondary | ICD-10-CM

## 2010-05-27 DIAGNOSIS — R0602 Shortness of breath: Secondary | ICD-10-CM

## 2010-05-27 DIAGNOSIS — I4510 Unspecified right bundle-branch block: Secondary | ICD-10-CM

## 2010-05-27 DIAGNOSIS — R0609 Other forms of dyspnea: Secondary | ICD-10-CM | POA: Insufficient documentation

## 2010-05-27 DIAGNOSIS — I4949 Other premature depolarization: Secondary | ICD-10-CM

## 2010-05-27 HISTORY — PX: CARDIOVASCULAR STRESS TEST: SHX262

## 2010-05-27 MED ORDER — TECHNETIUM TC 99M TETROFOSMIN IV KIT
11.0000 | PACK | Freq: Once | INTRAVENOUS | Status: AC | PRN
  Administered 2010-05-27: 11 via INTRAVENOUS

## 2010-05-27 MED ORDER — TECHNETIUM TC 99M TETROFOSMIN IV KIT
33.0000 | PACK | Freq: Once | INTRAVENOUS | Status: AC | PRN
  Administered 2010-05-27: 33 via INTRAVENOUS

## 2010-05-27 NOTE — Progress Notes (Addendum)
[facility] SITE 3 NUCLEAR MED [address] [place] [address] [phone]  Cardiology Nuclear Med Study Adrian Rios male [birth year]   Nuclear Med Background Indication for Stress Test:  Evaluation for Ischemia History:  GXT Cardiac Risk Factors: Family History - CAD, Lipids and RBBB  Symptoms:  Chest Tightness (last date of chest tightness 03/35/2012), DOE, Fatigue, Palpitations and SOB   Nuclear Pre-Procedure Caffeine/Decaff Intake:  none NPO After: 9:00pm   Lungs: clear IV 0.9% NS with Angio Cath:  20g  IV Site: R Wrist  IV Started by:  Cathlyn Parsons, RN  Chest Size (in):  41 Cup Size:   NA  Height: 5' 7.5" (1.715 m)  Weight:  170 lb (77.111 kg)  BMI:  Body mass index is 26.23 kg/(m^2). Tech Comments:      Nuclear Med Study 1 or 2 day study: 1 day  Stress Test Type:  Stress  Reading MD: Marca Ancona, MD  Order Authorizing Provider:  S.Klein  Resting Radionuclide: Technetium 52m Tetrofosmin  Resting Radionuclide Dose: 11 mCi   Stress Radionuclide:  Technetium 45m Tetrofosmin  Stress Radionuclide Dose: 33 mCi           Stress Protocol Rest HR: 62 Stress HR: 147  Rest BP: 117/77 Stress BP: 178/78  Exercise Time: 7:30 min METS: 9.30  Predicted HR: 128 % of Maximum: 97%        Dose of Adenosine:  N/A Dose of Lexiscan:  N/A  Dose of Atropine: N/A Dose of Dobutamine: N/A  Stress Test Technologist: Milana Na, EMT-P  Nuclear Technologist:  Domenic Polite, CNMT     Rest Procedure:  Myocardial perfusion imaging was performed at rest 45 minutes following the intravenous administration of Technetium 4m Tetrofosmin. Rest ECG: NRS with PVCS  Stress Procedure:  The patient exercised for 7:30. The patient stopped due to sob, fatigue, and denied any chest pain. There were no significant ST-T wave changes, + occ pacs,pvcs,and VTrigeminy.  Technetium 23m Tetrofosmin was injected at peak exercise and myocardial perfusion imaging was  performed after a brief delay. Stress ECG: No significant change from baseline ECG  QPS Raw Data Images:  Normal; no motion artifact; normal heart/lung ratio. Stress Images:  Normal homogeneous uptake in all areas of the myocardium. Rest Images:  Normal homogeneous uptake in all areas of the myocardium. Subtraction (SDS):  Normal  Findings Risk Category:  Normal nuclear study. Clinically Abnormal:  No Ischemia:  No Fixed Defect:  No LV Dysfunction:  No Transient Ischemic Dilatation (Normal <1.22):  1.04 Lung/Heart Ratio (Normal <0.45):  .25  Quantitative Gated Spect Images QGS EDV:  85 ml QGS ESV:  34 ml QGS cine images:  Normal wall motion.  QGS EF:  60 %  Impression Exercise Capacity:  Fair exercise capacity. BP Response:  Normal blood pressure response. Clinical Symptoms:  Short of breath, no chest pain.  ECG Impression:  No significant ST segment change suggestive of ischemia. Comparison with Prior Nuclear Study: No previous nuclear study performed  Overall Impression:  Normal stress nuclear study.       Please Inform Patient  Thanks

## 2010-06-02 ENCOUNTER — Telehealth: Payer: Self-pay | Admitting: Internal Medicine

## 2010-06-02 NOTE — Telephone Encounter (Signed)
Pt calling for results of stress test °

## 2010-06-02 NOTE — Telephone Encounter (Signed)
PT AWARE OF MYOVIEW RESULTS./CY 

## 2010-06-09 ENCOUNTER — Telehealth: Payer: Self-pay | Admitting: Internal Medicine

## 2010-06-09 NOTE — Telephone Encounter (Signed)
Pt's  Mailbox is full, unable to leave mess

## 2010-06-10 ENCOUNTER — Other Ambulatory Visit: Payer: Self-pay | Admitting: Internal Medicine

## 2010-06-10 DIAGNOSIS — R0602 Shortness of breath: Secondary | ICD-10-CM

## 2010-06-10 DIAGNOSIS — I4949 Other premature depolarization: Secondary | ICD-10-CM

## 2010-06-10 NOTE — Telephone Encounter (Signed)
Spoke with pt Cardiac Mri not done as of yet pt confused myoview as having done mri.Cardiac Mri scheduled./cy

## 2010-06-10 NOTE — Telephone Encounter (Signed)
Patient calling for results of MRI of heart he had done about 2 weeks ago.

## 2010-06-12 ENCOUNTER — Ambulatory Visit (HOSPITAL_COMMUNITY)
Admission: RE | Admit: 2010-06-12 | Discharge: 2010-06-12 | Disposition: A | Discharge status: Home / Self Care | Payer: Medicare Other | Source: Ambulatory Visit | Attending: Internal Medicine | Admitting: Internal Medicine

## 2010-06-12 DIAGNOSIS — R0602 Shortness of breath: Secondary | ICD-10-CM

## 2010-06-12 DIAGNOSIS — I4949 Other premature depolarization: Secondary | ICD-10-CM | POA: Insufficient documentation

## 2010-06-12 MED ORDER — GADOPENTETATE DIMEGLUMINE 469.01 MG/ML IV SOLN
35.0000 mL | Freq: Once | INTRAVENOUS | Status: AC
  Administered 2010-06-12: 35 mL via INTRAVENOUS

## 2010-06-13 ENCOUNTER — Telehealth: Payer: Self-pay | Admitting: Internal Medicine

## 2010-06-13 DIAGNOSIS — R002 Palpitations: Secondary | ICD-10-CM

## 2010-06-13 MED ORDER — METOPROLOL TARTRATE 25 MG PO TABS: 25.0000 mg | ORAL_TABLET | Freq: Two times a day (BID) | ORAL | Status: DC

## 2010-06-13 NOTE — Telephone Encounter (Signed)
Spoke with Dr. Graciela Husbands and he would like for patient to try taking Lopressor 25 mg twice daily for the palpitations. He will call & let us know if this doesn't help.

## 2010-06-16 ENCOUNTER — Telehealth: Payer: Self-pay | Admitting: Internal Medicine

## 2010-06-16 NOTE — Telephone Encounter (Signed)
rtn christine's call-call cell number [phone]

## 2010-06-16 NOTE — Telephone Encounter (Signed)
PT AWARE OF TEST RESULTS SEE RESULT  NOTE./CY

## 2010-07-03 ENCOUNTER — Other Ambulatory Visit: Payer: Self-pay | Admitting: Internal Medicine

## 2010-07-03 NOTE — Telephone Encounter (Signed)
Patient needs to schedule CPX with fasting labs  

## 2010-07-18 NOTE — Op Note (Signed)
. [facility]  Patient:    Adrian Rios, Adrian Rios Visit Number: [id number] MRN: [id number]          Service Type: DSU Location: [facility] Attending Physician:  Ronne Binning Dictated by:   Nicki Reaper, M.D. Proc. Date: 12/09/00 Admit Date:  12/09/2000                             Operative Report  PREOPERATIVE DIAGNOSIS:  Foreign body left thumb.  POSTOPERATIVE DIAGNOSIS:  Foreign body left thumb.  OPERATION:  Excision foreign body left thumb.  SURGEON:  Nicki Reaper, M.D.  ASSISTANT:  R.N.  ANESTHESIA:  Local.  HISTORY:  The patient is a 70 year old male who has a foreign body piece of metal in his thumb.  This is showing some erythema.  It has been in approximately one week.  PROCEDURE:  The patient was brought to the operating room.  A local infiltration anesthesia with 1% xylocaine was performed.  A tourniquet was placed high on the arm and was inflated to 250 mmHg after elevation for exsanguination.  A straight incision was made through the old wounds.  The purulent was immediately encountered.  Once sharp dissection, the foreign body was dissected free and removed.  The cultures were taken and this was irrigated and packed open.  Sterile compressive dressing was applied.  The patient tolerated the procedure well and was discharged home to return to the [facility] in one week on Tylenol #3 and Keflex. Dictated by:   Nicki Reaper, M.D. Attending Physician:  Ronne Binning DD:  12/09/00 TD:  12/09/00 Job: [id number]

## 2010-09-26 ENCOUNTER — Other Ambulatory Visit: Payer: Self-pay | Admitting: Internal Medicine

## 2010-10-30 ENCOUNTER — Other Ambulatory Visit: Payer: Self-pay | Admitting: Internal Medicine

## 2010-10-30 NOTE — Telephone Encounter (Signed)
Lipid/HEP 272.4/995.20

## 2010-11-11 ENCOUNTER — Other Ambulatory Visit: Payer: Self-pay | Admitting: Internal Medicine

## 2010-12-16 ENCOUNTER — Ambulatory Visit (INDEPENDENT_AMBULATORY_CARE_PROVIDER_SITE_OTHER): Payer: Medicare Other

## 2010-12-16 DIAGNOSIS — Z23 Encounter for immunization: Secondary | ICD-10-CM

## 2011-02-23 ENCOUNTER — Other Ambulatory Visit: Payer: Self-pay | Admitting: Internal Medicine

## 2011-03-17 DIAGNOSIS — D485 Neoplasm of uncertain behavior of skin: Secondary | ICD-10-CM | POA: Diagnosis not present

## 2011-04-17 ENCOUNTER — Other Ambulatory Visit: Payer: Self-pay | Admitting: *Deleted

## 2011-04-17 MED ORDER — MONTELUKAST SODIUM 10 MG PO TABS: ORAL_TABLET | ORAL | Status: DC

## 2011-04-17 NOTE — Telephone Encounter (Signed)
Rx sent 

## 2011-04-29 DIAGNOSIS — H02059 Trichiasis without entropian unspecified eye, unspecified eyelid: Secondary | ICD-10-CM | POA: Diagnosis not present

## 2011-04-29 DIAGNOSIS — H40059 Ocular hypertension, unspecified eye: Secondary | ICD-10-CM | POA: Diagnosis not present

## 2011-06-03 ENCOUNTER — Other Ambulatory Visit: Payer: Self-pay | Admitting: Internal Medicine

## 2011-06-03 NOTE — Telephone Encounter (Signed)
Patient needs to schedule a CPX  

## 2011-06-18 DIAGNOSIS — L821 Other seborrheic keratosis: Secondary | ICD-10-CM | POA: Diagnosis not present

## 2011-06-18 DIAGNOSIS — Z85828 Personal history of other malignant neoplasm of skin: Secondary | ICD-10-CM | POA: Diagnosis not present

## 2011-06-18 DIAGNOSIS — L570 Actinic keratosis: Secondary | ICD-10-CM | POA: Diagnosis not present

## 2011-06-26 DIAGNOSIS — Z961 Presence of intraocular lens: Secondary | ICD-10-CM | POA: Diagnosis not present

## 2011-06-26 DIAGNOSIS — H40059 Ocular hypertension, unspecified eye: Secondary | ICD-10-CM | POA: Diagnosis not present

## 2011-08-25 ENCOUNTER — Other Ambulatory Visit: Payer: Self-pay | Admitting: Internal Medicine

## 2011-08-25 NOTE — Telephone Encounter (Signed)
Patient needs to schedule a CPX  

## 2011-09-01 ENCOUNTER — Other Ambulatory Visit: Payer: Self-pay | Admitting: Internal Medicine

## 2011-09-30 DIAGNOSIS — J029 Acute pharyngitis, unspecified: Secondary | ICD-10-CM | POA: Diagnosis not present

## 2011-09-30 DIAGNOSIS — J069 Acute upper respiratory infection, unspecified: Secondary | ICD-10-CM | POA: Diagnosis not present

## 2011-09-30 DIAGNOSIS — H10029 Other mucopurulent conjunctivitis, unspecified eye: Secondary | ICD-10-CM | POA: Diagnosis not present

## 2011-10-27 ENCOUNTER — Ambulatory Visit (INDEPENDENT_AMBULATORY_CARE_PROVIDER_SITE_OTHER): Payer: Medicare Other | Admitting: Internal Medicine

## 2011-10-27 ENCOUNTER — Encounter: Payer: Self-pay | Admitting: Internal Medicine

## 2011-10-27 VITALS — BP 108/66 | HR 94 | Temp 98.1°F | Resp 12 | Ht 68.03 in | Wt 170.8 lb

## 2011-10-27 DIAGNOSIS — E785 Hyperlipidemia, unspecified: Secondary | ICD-10-CM | POA: Diagnosis not present

## 2011-10-27 DIAGNOSIS — Z0000 Encounter for general adult medical examination without abnormal findings: Secondary | ICD-10-CM

## 2011-10-27 DIAGNOSIS — J45909 Unspecified asthma, uncomplicated: Secondary | ICD-10-CM | POA: Insufficient documentation

## 2011-10-27 DIAGNOSIS — D649 Anemia, unspecified: Secondary | ICD-10-CM | POA: Diagnosis not present

## 2011-10-27 DIAGNOSIS — IMO0002 Reserved for concepts with insufficient information to code with codable children: Secondary | ICD-10-CM | POA: Insufficient documentation

## 2011-10-27 NOTE — Patient Instructions (Addendum)
Preventive Health Care: Exercise at least 30-45 minutes a day,  3-4 days a week.  Eat a low-fat diet with lots of fruits and vegetables, up to 7-9 servings per day.  Consume less than 40 grams of sugar per day from foods & drinks with High Fructose Corn Sugar as #1,2,3 or # 4 on label. Please  schedule fasting Labs : BMET,Lipids, hepatic panel, CBC & dif, TSH. PLEASE BRING THESE INSTRUCTIONS TO FOLLOW UP  LAB APPOINTMENT.This will guarantee correct labs are drawn, eliminating need for repeat blood sampling ( needle sticks ! ). Diagnoses /Codes: 272.4,493.90,790.29,285.9.   If you activate My Chart; the results can be released to you as soon as they populate from the lab. If you choose not to use this program; the labs have to be reviewed, copied & mailed   causing a delay in getting the results to you.

## 2011-10-27 NOTE — Progress Notes (Signed)
Subjective:    Patient ID: Adrian Rios, male    DOB: 1940-03-03, 71 y.o.   MRN: [id number]  HPI Medicare Wellness Visit:  The following psychosocial & medical history were reviewed as required by Medicare.   Social history: caffeine: 3 cups/ day ( PMH of PVCs) , alcohol:  none ,  tobacco use :never  & exercise : yard work; elliptical 3-4X/ week.   Home & personal  safety / fall risk: no issues, activities of daily living: no limitations , seatbelt use : yes , and smoke alarm employment : yes.  Power of Attorney/Living Will status : ? In place  Vision ( as recorded per Nurse) & Hearing  evaluation : Ophth exam every 6 mos.Hearing amplifiers worn Orientation :oriented X 3 , memory & recall :good ,  math testing:good,and mood & affect : normal . Depression / anxiety: denied Travel history : [place] 2008 , immunization status :up to date , transfusion history:  no, and preventive health surveillance ( colonoscopies, BMD , etc as per protocol/ [facility]): colonoscopy ? due, Dental care:  Overdue  . Chart reviewed &  Updated. Active issues reviewed & addressed.       Review of Systems Asthma (RAD) assessment Triggers (environmental, infectious, allergic):unknown Rescue inhaler use:no Maintenance medications/ response:Singulair & Advair control symptoms Nocturnal dyspnea:occasionally Associated symptoms: Allergic:No itchy eyes, sneezing, postnasal drainage,  angioedema Constitution:No fever, chills, sweats ENT: No frontal headache, facial pain, nasal purulence, sore throat, dental pain Cardiovascular: Minimal palpitations. No edema, calf swelling/ pain Respiratory: Intermittent cough & sputum with dyspnea. Occasional wheezing GI: Occasional  Heartburn/dyspepsia. No dysphagia Derm: No rash, urticaria/hives Heme/lymph: No lymphadenopathy  Past medical history: Seasonal allergies : ? worse in Spring & Fall Family history pulmonary disease: sister has bronchitis     Objective:   Physical Exam  Gen.:  well-nourished in appearance. Alert, appropriate and cooperative throughout exam. Head: Normocephalic without obvious abnormalities; no alopecia  Eyes: No corneal or conjunctival inflammation noted. Pupils asymmetric ; OD > OS. Extraocular motion intact. Vision grossly normal with lenses. Ears: External  ear exam reveals no significant lesions or deformities.  Hearing amplifiers worn bilaterally. Nose: External nasal exam reveals no deformity or inflammation. Nasal mucosa are pink and moist. No lesions or exudates noted.  Mouth: Oral mucosa and oropharynx reveal no lesions or exudates. Teeth in good repair. Neck: No deformities, masses, or tenderness noted. Range of motion & Thyroid normal Lungs: Normal respiratory effort; chest expands symmetrically. Lungs are clear to auscultation without rales, wheezes, or increased work of breathing. Heart: Normal rate and rhythm. Normal S1 and S2. No gallop, click, or rub. S4 w/o murmur. Abdomen: Bowel sounds normal; abdomen soft and nontender. No masses, organomegaly or hernias noted. Genitalia: Dr Marcello Fennel Musculoskeletal/extremities: No deformity or scoliosis noted of  the thoracic or lumbar spine. No clubbing, cyanosis, edema, or deformity noted. Range of motion  normal .Tone & strength  normal.Joints normal. Fingernail traumatic changes. Vascular: Carotid, radial artery, dorsalis pedis and  posterior tibial pulses are full and equal. No bruits present. Neurologic: Alert and oriented x3. Deep tendon reflexes symmetrical and normal.          Skin: Intact without suspicious lesions or rashes. Lymph: No cervical, axillary lymphadenopathy present. Psych: Mood and affect are normal. Normally interactive  Assessment & Plan:  #1 Medicare Wellness Exam; criteria met ; data entered #2 Problem List reviewed ; Assessment/ Recommendations made Plan: see Orders

## 2011-10-29 ENCOUNTER — Other Ambulatory Visit (INDEPENDENT_AMBULATORY_CARE_PROVIDER_SITE_OTHER): Payer: Medicare Other

## 2011-10-29 ENCOUNTER — Ambulatory Visit (INDEPENDENT_AMBULATORY_CARE_PROVIDER_SITE_OTHER)
Admission: RE | Admit: 2011-10-29 | Discharge: 2011-10-29 | Disposition: A | Discharge status: Home / Self Care | Payer: Medicare Other | Source: Ambulatory Visit | Attending: Internal Medicine | Admitting: Internal Medicine

## 2011-10-29 DIAGNOSIS — J45909 Unspecified asthma, uncomplicated: Secondary | ICD-10-CM | POA: Diagnosis not present

## 2011-10-29 DIAGNOSIS — D649 Anemia, unspecified: Secondary | ICD-10-CM

## 2011-10-29 DIAGNOSIS — E785 Hyperlipidemia, unspecified: Secondary | ICD-10-CM | POA: Diagnosis not present

## 2011-10-29 DIAGNOSIS — Z0000 Encounter for general adult medical examination without abnormal findings: Secondary | ICD-10-CM | POA: Diagnosis not present

## 2011-10-29 LAB — BASIC METABOLIC PANEL
CO2: 24 mEq/L (ref 19–32)
Chloride: 107 mEq/L (ref 96–112)
Glucose, Bld: 91 mg/dL (ref 70–99)
Potassium: 3.9 mEq/L (ref 3.5–5.1)
Sodium: 139 mEq/L (ref 135–145)

## 2011-10-29 LAB — LIPID PANEL
HDL: 44.6 mg/dL (ref >39.00)
LDL Cholesterol: 98 mg/dL (ref 0–99)
Total CHOL/HDL Ratio: 4
VLDL: 21.8 mg/dL (ref 0.0–40.0)

## 2011-10-29 LAB — HEPATIC FUNCTION PANEL
ALT: 24 U/L (ref 0–53)
Total Bilirubin: 0.5 mg/dL (ref 0.3–1.2)

## 2011-10-29 LAB — CBC WITH DIFFERENTIAL/PLATELET
Basophils Relative: 0.5 % (ref 0.0–3.0)
Eosinophils Relative: 3.8 % (ref 0.0–5.0)
MCV: 91.1 fl (ref 78.0–100.0)
Monocytes Absolute: 0.6 10*3/uL (ref 0.1–1.0)
Neutrophils Relative %: 54.2 % (ref 43.0–77.0)
RBC: 4.46 Mil/uL (ref 4.22–5.81)
WBC: 7.6 10*3/uL (ref 4.5–10.5)

## 2011-10-30 NOTE — Progress Notes (Signed)
Labs only

## 2011-11-04 ENCOUNTER — Other Ambulatory Visit: Payer: Self-pay | Admitting: Internal Medicine

## 2011-11-25 ENCOUNTER — Other Ambulatory Visit: Payer: Self-pay | Admitting: Internal Medicine

## 2011-12-18 DIAGNOSIS — L570 Actinic keratosis: Secondary | ICD-10-CM | POA: Diagnosis not present

## 2011-12-18 DIAGNOSIS — D239 Other benign neoplasm of skin, unspecified: Secondary | ICD-10-CM | POA: Diagnosis not present

## 2011-12-18 DIAGNOSIS — Z85828 Personal history of other malignant neoplasm of skin: Secondary | ICD-10-CM | POA: Diagnosis not present

## 2011-12-18 DIAGNOSIS — L821 Other seborrheic keratosis: Secondary | ICD-10-CM | POA: Diagnosis not present

## 2011-12-18 DIAGNOSIS — I789 Disease of capillaries, unspecified: Secondary | ICD-10-CM | POA: Diagnosis not present

## 2011-12-31 ENCOUNTER — Ambulatory Visit: Payer: Medicare Other

## 2012-01-06 ENCOUNTER — Ambulatory Visit (INDEPENDENT_AMBULATORY_CARE_PROVIDER_SITE_OTHER): Payer: Medicare Other

## 2012-01-06 DIAGNOSIS — Z23 Encounter for immunization: Secondary | ICD-10-CM | POA: Diagnosis not present

## 2012-01-12 DIAGNOSIS — J209 Acute bronchitis, unspecified: Secondary | ICD-10-CM | POA: Diagnosis not present

## 2012-01-12 DIAGNOSIS — R0989 Other specified symptoms and signs involving the circulatory and respiratory systems: Secondary | ICD-10-CM | POA: Diagnosis not present

## 2012-01-12 DIAGNOSIS — R0609 Other forms of dyspnea: Secondary | ICD-10-CM | POA: Diagnosis not present

## 2012-01-12 DIAGNOSIS — R0602 Shortness of breath: Secondary | ICD-10-CM | POA: Diagnosis not present

## 2012-01-12 DIAGNOSIS — J45909 Unspecified asthma, uncomplicated: Secondary | ICD-10-CM | POA: Diagnosis not present

## 2012-02-02 DIAGNOSIS — Z961 Presence of intraocular lens: Secondary | ICD-10-CM | POA: Diagnosis not present

## 2012-02-02 DIAGNOSIS — H2510 Age-related nuclear cataract, unspecified eye: Secondary | ICD-10-CM | POA: Diagnosis not present

## 2012-02-02 DIAGNOSIS — H35379 Puckering of macula, unspecified eye: Secondary | ICD-10-CM | POA: Diagnosis not present

## 2012-02-02 DIAGNOSIS — H31009 Unspecified chorioretinal scars, unspecified eye: Secondary | ICD-10-CM | POA: Diagnosis not present

## 2012-02-02 DIAGNOSIS — H4011X0 Primary open-angle glaucoma, stage unspecified: Secondary | ICD-10-CM | POA: Diagnosis not present

## 2012-02-03 DIAGNOSIS — H35379 Puckering of macula, unspecified eye: Secondary | ICD-10-CM | POA: Diagnosis not present

## 2012-02-03 DIAGNOSIS — H40059 Ocular hypertension, unspecified eye: Secondary | ICD-10-CM | POA: Diagnosis not present

## 2012-02-09 DIAGNOSIS — N423 Unspecified dysplasia of prostate: Secondary | ICD-10-CM | POA: Diagnosis not present

## 2012-03-08 DIAGNOSIS — R339 Retention of urine, unspecified: Secondary | ICD-10-CM | POA: Diagnosis not present

## 2012-03-08 DIAGNOSIS — R3989 Other symptoms and signs involving the genitourinary system: Secondary | ICD-10-CM | POA: Diagnosis not present

## 2012-03-21 ENCOUNTER — Other Ambulatory Visit: Payer: Self-pay | Admitting: Internal Medicine

## 2012-04-07 DIAGNOSIS — R339 Retention of urine, unspecified: Secondary | ICD-10-CM | POA: Diagnosis not present

## 2012-04-07 DIAGNOSIS — R3989 Other symptoms and signs involving the genitourinary system: Secondary | ICD-10-CM | POA: Diagnosis not present

## 2012-05-04 DIAGNOSIS — H40059 Ocular hypertension, unspecified eye: Secondary | ICD-10-CM | POA: Diagnosis not present

## 2012-05-24 ENCOUNTER — Other Ambulatory Visit: Payer: Self-pay | Admitting: Internal Medicine

## 2012-06-09 ENCOUNTER — Other Ambulatory Visit: Payer: Self-pay | Admitting: Internal Medicine

## 2012-06-09 ENCOUNTER — Telehealth: Payer: Self-pay | Admitting: Internal Medicine

## 2012-06-09 NOTE — Telephone Encounter (Signed)
RX sent, DUPLICATE REQUEST

## 2012-06-09 NOTE — Telephone Encounter (Signed)
PT came in today and stated that he is out of his medication and also his pharmacy sent a refill request in. If we can go ahead a refill it for him (venlafaxine)

## 2012-06-14 ENCOUNTER — Telehealth: Payer: Self-pay | Admitting: Internal Medicine

## 2012-06-14 NOTE — Telephone Encounter (Signed)
Hopp please advise  

## 2012-06-14 NOTE — Telephone Encounter (Signed)
I reviewed the old records; an MRI of the spine was performed in 2011. Unfortunately the neurosurgeon willl want  An updated note and evaluation. Typically they do not see someone without a current MRI. Please schedule followup appointment to meet their requirements

## 2012-06-14 NOTE — Telephone Encounter (Signed)
Patient states that we referred him to Dr. Venetia Maxon a couple years ago. He never went to the appointment, but now states he is ready to see a Careers adviser. Pt needs new referral.

## 2012-06-14 NOTE — Telephone Encounter (Signed)
Discuss with patient, appt scheduled. 

## 2012-06-21 ENCOUNTER — Encounter: Payer: Self-pay | Admitting: Internal Medicine

## 2012-06-21 ENCOUNTER — Ambulatory Visit (INDEPENDENT_AMBULATORY_CARE_PROVIDER_SITE_OTHER): Payer: Medicare Other | Admitting: Internal Medicine

## 2012-06-21 VITALS — BP 118/76 | HR 71 | Wt 175.0 lb

## 2012-06-21 DIAGNOSIS — R972 Elevated prostate specific antigen [PSA]: Secondary | ICD-10-CM | POA: Diagnosis not present

## 2012-06-21 DIAGNOSIS — M5137 Other intervertebral disc degeneration, lumbosacral region: Secondary | ICD-10-CM

## 2012-06-21 MED ORDER — CYCLOBENZAPRINE HCL 5 MG PO TABS: ORAL_TABLET | ORAL | Status: DC

## 2012-06-21 MED ORDER — TRAMADOL HCL 50 MG PO TABS: 50.0000 mg | ORAL_TABLET | Freq: Three times a day (TID) | ORAL | Status: DC | PRN

## 2012-06-21 NOTE — Progress Notes (Signed)
  Subjective:    Patient ID: Adrian Rios, male    DOB: [birth year], 72 y.o.   MRN: [id number]  HPI  He has had right lumbosacral area pain for over 3 years. 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  This discomfort is described as dull-throbbing up to a level of 6 or less. It can last hours. 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 Remotely he was taking gabapentin  but was intolerant to this medication  Occasionally he'll have radiation of the pain into the right buttock. He also may have minor tingling in the right calf occasionally  He does have a past history of elevated PSA with nondiagnostic cytology on biopsy performed by Dr. Patsi Sears.    Review of Systems He denies significant right lower extremity weakness or urinary/stool incontinence.    Objective:   Physical Exam Gen.: Healthy and well-nourished in appearance. Alert, appropriate and cooperative throughout exam.Appears younger than stated age  Head: Normocephalic without obvious abnormalities Eyes: No corneal or conjunctival inflammation noted. Pupils unequal ;OD pupil deformed post operatively.  Neck: No deformities, masses, or tenderness noted. Range of motion normal. Lungs: Normal respiratory effort; chest expands symmetrically. Lungs are essentially clear to auscultation with minimal bibasilar  Rales. No wheezes, or increased work of breathing. Heart: Normal rate and rhythm. Normal S1 and S2. No gallop, click, or rub.S4 w/o murmur. Abdomen: Bowel sounds normal; abdomen soft and nontender. No masses, organomegaly or hernias noted.                          Musculoskeletal/extremities: There is subtle asymmetry of the posterior thoracic musculature suggesting occult scoliosis. No clubbing, cyanosis, edema, or significant extremity  deformity noted. Range of  motion normal .Tone & strength  Normal. 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 Nail health good but thumbnails deformed. Able to lie down & sit up w/o help. Negative SLR bilaterally Vascular: Carotid, radial artery, dorsalis pedis and  posterior tibial pulses are full and equal. No bruits present. Neurologic: Alert and oriented x3. Deep tendon reflexes symmetrical and normal. Gait normal  including heel & toe walking .        Skin: Intact without suspicious lesions or rashes. Lymph: No cervical, axillary lymphadenopathy present. Psych: Mood and affect are normal. Normally interactive                                                                                        Assessment & Plan:

## 2012-06-21 NOTE — Patient Instructions (Addendum)
The best exercises for the low back include freestyle swimming, stretch aerobics, and yoga. 

## 2012-06-21 NOTE — Assessment & Plan Note (Signed)
   His exam reveals no neuromuscular deficit at this time. Strength and tone are excellent and gait (heel/toe) is normal. Clinically there is no evidence of a neurosurgical process.  The major concern is his use of relatively high dose of nonsteroidals with gastric and possible cardiac risk. Tramadol will be prescribed. Aggressive back exercises recommended. Chiropractory could be considered.

## 2012-06-21 NOTE — Assessment & Plan Note (Signed)
He is followed every 3-6 months. There's been no evidence of prostatic malignancy.

## 2012-07-04 DIAGNOSIS — H40059 Ocular hypertension, unspecified eye: Secondary | ICD-10-CM | POA: Diagnosis not present

## 2012-08-17 ENCOUNTER — Other Ambulatory Visit (INDEPENDENT_AMBULATORY_CARE_PROVIDER_SITE_OTHER): Payer: Medicare Other

## 2012-08-17 ENCOUNTER — Encounter: Payer: Self-pay | Admitting: Internal Medicine

## 2012-08-17 DIAGNOSIS — Z0000 Encounter for general adult medical examination without abnormal findings: Secondary | ICD-10-CM

## 2012-08-17 DIAGNOSIS — Z1289 Encounter for screening for malignant neoplasm of other sites: Secondary | ICD-10-CM

## 2012-08-17 LAB — HEMOCCULT GUIAC POC 1CARD (OFFICE)
Card #3 Fecal Occult Blood, POC: NEGATIVE
Fecal Occult Blood, POC: NEGATIVE

## 2012-08-23 ENCOUNTER — Ambulatory Visit (INDEPENDENT_AMBULATORY_CARE_PROVIDER_SITE_OTHER): Payer: Medicare Other | Admitting: Family Medicine

## 2012-08-23 ENCOUNTER — Encounter: Payer: Self-pay | Admitting: Family Medicine

## 2012-08-23 VITALS — BP 110/60 | HR 85 | Temp 98.4°F | Wt 174.6 lb

## 2012-08-23 DIAGNOSIS — J209 Acute bronchitis, unspecified: Secondary | ICD-10-CM | POA: Diagnosis not present

## 2012-08-23 MED ORDER — LEVALBUTEROL TARTRATE 45 MCG/ACT IN AERO: 2.0000 | INHALATION_SPRAY | Freq: Four times a day (QID) | RESPIRATORY_TRACT | Status: DC | PRN

## 2012-08-23 MED ORDER — GUAIFENESIN-CODEINE 100-10 MG/5ML PO SYRP: ORAL_SOLUTION | ORAL | Status: DC

## 2012-08-23 MED ORDER — AZITHROMYCIN 250 MG PO TABS: ORAL_TABLET | ORAL | Status: DC

## 2012-08-23 MED ORDER — ALBUTEROL SULFATE (2.5 MG/3ML) 0.083% IN NEBU
2.5000 mg | INHALATION_SOLUTION | Freq: Once | RESPIRATORY_TRACT | Status: AC
  Administered 2012-08-23: 2.5 mg via RESPIRATORY_TRACT

## 2012-08-23 NOTE — Patient Instructions (Signed)

## 2012-08-23 NOTE — Progress Notes (Signed)
  Subjective:     Adrian Rios is a 72 y.o. male here for evaluation of a cough. Onset of symptoms was 3 days ago. Symptoms have been gradually worsening since that time. The cough is dry and productive and is aggravated by dust, exercise, infection and reclining position. Associated symptoms include: shortness of breath, sputum production and weight loss. Patient does have a history of asthma. Patient does have a history of environmental allergens. Patient has not traveled recently. Patient does not have a history of smoking. Patient has not had a previous chest x-ray. Patient has not had a PPD done.  The following portions of the patient's history were reviewed and updated as appropriate: allergies, current medications, past family history, past medical history, past social history, past surgical history and problem list.  Review of Systems Pertinent items are noted in HPI.    Objective:    Oxygen saturation 94% on room air BP 110/60  Pulse 85  Temp(Src) 98.4 F (36.9 C) (Oral)  Wt 174 lb 9.6 oz (79.198 kg)  BMI 26.52 kg/m2  SpO2 94% General appearance: alert, cooperative, appears stated age and no distress Head: Normocephalic, without obvious abnormality, atraumatic Eyes: conjunctivae/corneas clear. PERRL, EOM's intact. Fundi benign. Ears: normal TM's and external ear canals both ears Nose: Nares normal. Septum midline. Mucosa normal. No drainage or sinus tenderness. Throat: lips, mucosa, and tongue normal; teeth and gums normal Neck: no adenopathy, supple, symmetrical, trachea midline and thyroid not enlarged, symmetric, no tenderness/mass/nodules Lungs: diminished breath sounds bilaterally Extremities: extremities normal, atraumatic, no cyanosis or edema    Assessment:    Acute Bronchitis    Plan:    Explained lack of efficacy of antibiotics in viral disease. Antitussives per medication orders. Avoid exposure to tobacco smoke and fumes. B-agonist inhaler. Call if  shortness of breath worsens, blood in sputum, change in character of cough, development of fever or chills, inability to maintain nutrition and hydration. Avoid exposure to tobacco smoke and fumes.

## 2012-10-05 ENCOUNTER — Other Ambulatory Visit: Payer: Self-pay

## 2012-11-25 ENCOUNTER — Other Ambulatory Visit: Payer: Self-pay | Admitting: Internal Medicine

## 2012-11-25 NOTE — Telephone Encounter (Signed)
Med filled.  

## 2012-12-10 ENCOUNTER — Other Ambulatory Visit: Payer: Self-pay | Admitting: Internal Medicine

## 2012-12-12 ENCOUNTER — Other Ambulatory Visit: Payer: Self-pay | Admitting: *Deleted

## 2012-12-12 MED ORDER — FLUTICASONE-SALMETEROL 250-50 MCG/DOSE IN AEPB: 1.0000 | INHALATION_SPRAY | Freq: Two times a day (BID) | RESPIRATORY_TRACT | Status: DC

## 2012-12-12 NOTE — Telephone Encounter (Signed)
Advair diskus refill sent to pharmacy 

## 2012-12-20 ENCOUNTER — Ambulatory Visit (INDEPENDENT_AMBULATORY_CARE_PROVIDER_SITE_OTHER): Payer: Medicare Other

## 2012-12-20 DIAGNOSIS — Z23 Encounter for immunization: Secondary | ICD-10-CM | POA: Diagnosis not present

## 2012-12-23 DIAGNOSIS — L570 Actinic keratosis: Secondary | ICD-10-CM | POA: Diagnosis not present

## 2012-12-23 DIAGNOSIS — D2330 Other benign neoplasm of skin of unspecified part of face: Secondary | ICD-10-CM | POA: Diagnosis not present

## 2012-12-23 DIAGNOSIS — I8310 Varicose veins of unspecified lower extremity with inflammation: Secondary | ICD-10-CM | POA: Diagnosis not present

## 2012-12-23 DIAGNOSIS — L819 Disorder of pigmentation, unspecified: Secondary | ICD-10-CM | POA: Diagnosis not present

## 2012-12-23 DIAGNOSIS — L821 Other seborrheic keratosis: Secondary | ICD-10-CM | POA: Diagnosis not present

## 2012-12-23 DIAGNOSIS — D1801 Hemangioma of skin and subcutaneous tissue: Secondary | ICD-10-CM | POA: Diagnosis not present

## 2012-12-23 DIAGNOSIS — D239 Other benign neoplasm of skin, unspecified: Secondary | ICD-10-CM | POA: Diagnosis not present

## 2012-12-23 DIAGNOSIS — D485 Neoplasm of uncertain behavior of skin: Secondary | ICD-10-CM | POA: Diagnosis not present

## 2012-12-23 DIAGNOSIS — Z85828 Personal history of other malignant neoplasm of skin: Secondary | ICD-10-CM | POA: Diagnosis not present

## 2012-12-28 ENCOUNTER — Ambulatory Visit (INDEPENDENT_AMBULATORY_CARE_PROVIDER_SITE_OTHER): Payer: Medicare Other | Admitting: *Deleted

## 2012-12-28 DIAGNOSIS — Z23 Encounter for immunization: Secondary | ICD-10-CM

## 2013-02-10 DIAGNOSIS — R339 Retention of urine, unspecified: Secondary | ICD-10-CM | POA: Diagnosis not present

## 2013-02-10 DIAGNOSIS — N423 Unspecified dysplasia of prostate: Secondary | ICD-10-CM | POA: Diagnosis not present

## 2013-02-21 ENCOUNTER — Encounter: Payer: Self-pay | Admitting: Internal Medicine

## 2013-02-21 ENCOUNTER — Ambulatory Visit (INDEPENDENT_AMBULATORY_CARE_PROVIDER_SITE_OTHER): Payer: Medicare Other | Admitting: Internal Medicine

## 2013-02-21 VITALS — BP 100/63 | HR 96 | Temp 98.7°F | Wt 174.6 lb

## 2013-02-21 DIAGNOSIS — I83811 Varicose veins of right lower extremities with pain: Secondary | ICD-10-CM

## 2013-02-21 DIAGNOSIS — I83893 Varicose veins of bilateral lower extremities with other complications: Secondary | ICD-10-CM | POA: Diagnosis not present

## 2013-02-21 NOTE — Progress Notes (Signed)
Pre visit review using our clinic review tool, if applicable. No additional management support is needed unless otherwise documented below in the visit note. 

## 2013-02-21 NOTE — Progress Notes (Signed)
   Subjective:    Patient ID: Adrian Rios, male    DOB: [birth year], 72 y.o.   MRN: [id number]  HPI   He has had intermittent pain along the anterior aspect of the right leg at the site of a large varicose vein. At times his been tender to touch. The pain can last hours-all day. Other times be pain free. It has been worse when ambulating when he is having pain.  He does not relate this to any injury or trigger .     Review of Systems He specifically denies chest pain, palpitations, dyspnea, pleuritic chest pain, or hemoptysis. He's had no fever, chills, or sweats.     Objective:   Physical Exam Appears healthy and well-nourished & in no acute distress  No carotid bruits are present.No neck pain distention present at 10 - 15 degrees. Thyroid normal to palpation  Heart rhythm and rate are normal with no significant murmurs or gallops.  Chest is clear with no increased work of breathing  There is no evidence of aortic aneurysm or renal artery bruits  Abdomen soft with no organomegaly or masses. No HJR  No clubbing, cyanosis or edema present. Homans sign is negative bilaterally.  There is extensive varicose vein formation along the anterior aspect of the right leg. It extends a total of 23 cm from the ankle superiorly. There is a large round vascular structure 5 x 5.5 cm. This is nontender. The varicosity empties almost immediately as soon as the leg is elevated.  Pedal pulses are intact . No ischemic skin changes are present .   Alert and oriented. 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  Plan: Vascular surgery referral. Support hose pending that evaluation.

## 2013-02-21 NOTE — Patient Instructions (Signed)
Wear over the calf support hose if you are standing for prolonged periods of  time or working on your feet for prolonged periods of time. 

## 2013-02-27 ENCOUNTER — Other Ambulatory Visit: Payer: Self-pay | Admitting: *Deleted

## 2013-02-27 DIAGNOSIS — I83893 Varicose veins of bilateral lower extremities with other complications: Secondary | ICD-10-CM

## 2013-03-01 ENCOUNTER — Other Ambulatory Visit: Payer: Self-pay | Admitting: Internal Medicine

## 2013-03-01 NOTE — Telephone Encounter (Signed)
Venlafaxine refilled per protocol. JG//CMA 

## 2013-03-07 ENCOUNTER — Other Ambulatory Visit: Payer: Self-pay | Admitting: Internal Medicine

## 2013-03-07 NOTE — Telephone Encounter (Signed)
Advair refilled per protocol. JG//CMA

## 2013-03-17 ENCOUNTER — Encounter: Payer: Self-pay | Admitting: Vascular Surgery

## 2013-03-20 ENCOUNTER — Encounter: Payer: Self-pay | Admitting: Vascular Surgery

## 2013-03-20 ENCOUNTER — Ambulatory Visit (INDEPENDENT_AMBULATORY_CARE_PROVIDER_SITE_OTHER): Payer: Medicare Other | Admitting: Vascular Surgery

## 2013-03-20 ENCOUNTER — Ambulatory Visit (HOSPITAL_COMMUNITY)
Admission: RE | Admit: 2013-03-20 | Discharge: 2013-03-20 | Disposition: A | Discharge status: Home / Self Care | Payer: Medicare Other | Source: Ambulatory Visit | Attending: Vascular Surgery | Admitting: Vascular Surgery

## 2013-03-20 VITALS — BP 119/74 | HR 85 | Resp 16 | Ht 67.0 in | Wt 175.0 lb

## 2013-03-20 DIAGNOSIS — I83893 Varicose veins of bilateral lower extremities with other complications: Secondary | ICD-10-CM

## 2013-03-20 DIAGNOSIS — I8390 Asymptomatic varicose veins of unspecified lower extremity: Secondary | ICD-10-CM | POA: Diagnosis not present

## 2013-03-20 NOTE — Progress Notes (Signed)
Subjective:     Patient ID: Adrian Rios, male   DOB: June 23, 1940, 73 y.o.   MRN: [id number]  HPI this 73 year old male was referred by Dr. Linna Darner for evaluation of varicose veins in the right leg. Patient has had a small nest of varicosities in his right lower leg for the past several years. These are occasionally painful. He has no history of thrombophlebitis, DVT, bleeding, stasis ulcers, or distal edema. He does not related to compression stockings. He has never had treatment before. He has no pain the contralateral left leg.  Past Medical History  Diagnosis Date  . RAD (reactive airway disease)   . Abnormal prostate biopsy     Dr Hartley Barefoot  . Hyperlipidemia     History  Substance Use Topics  . Smoking status: Never Smoker   . Smokeless tobacco: Not on file  . Alcohol Use: No    Family History  Problem Relation Age of Onset  . Stroke Mother   . Breast cancer Sister   . Heart disease Maternal Grandmother 84    MI   . Breast cancer Paternal Grandmother   . Heart disease Paternal Grandfather 54    MI  . Diabetes Neg Hx     Allergies  Allergen Reactions  . Gabapentin     REACTION: dizziness and flushing  . Iodine   . Niacin-Lovastatin Er     Current outpatient prescriptions:ADVAIR DISKUS 250-50 MCG/DOSE AEPB, INHALE ONE DOSE BY MOUTH EVERY 12 HOURS, Disp: 60 each, Rfl: 0;  ADVAIR DISKUS 250-50 MCG/DOSE AEPB, INHALE ONE DOSE BY MOUTH EVERY 12 HOURS, Disp: 60 each, Rfl: 0;  cyclobenzaprine (FLEXERIL) 5 MG tablet, 1 qhs prn May affect balance & degree of alertness, Disp: 15 tablet, Rfl: 0;  dutasteride (AVODART) 0.5 MG capsule, Take 0.5 mg by mouth daily., Disp: , Rfl:  levalbuterol (XOPENEX HFA) 45 MCG/ACT inhaler, Inhale 2 puffs into the lungs every 6 (six) hours as needed for wheezing., Disp: 1 Inhaler, Rfl: 12;  montelukast (SINGULAIR) 10 MG tablet, TAKE 1 TABLET BY MOUTH ONCE DAILY, Disp: 180 tablet, Rfl: 1;  venlafaxine (EFFEXOR) 75 MG tablet, TAKE ONE TABLET BY MOUTH EVERY  DAY, Disp: 90 tablet, Rfl: 0  BP 119/74  Pulse 85  Resp 16  Ht 5\' 7"  (1.702 m)  Wt 175 lb (79.379 kg)  BMI 27.40 kg/m2  Body mass index is 27.4 kg/(m^2).          Review of Systems denies chest pain, dyspnea on exertion, PND, orthopnea, claudication, lateralizing weakness, aphasia,-all systems negative and a complete review of systems    Objective:   Physical Exam BP 119/74  Pulse 85  Resp 16  Ht 5\' 7"  (1.702 m)  Wt 175 lb (79.379 kg)  BMI 27.40 kg/m2  Gen.-alert and oriented x3 in no apparent distress HEENT normal for age Lungs no rhonchi or wheezing Cardiovascular regular rhythm no murmurs carotid pulses 3+ palpable no bruits audible Abdomen soft nontender no palpable masses Musculoskeletal free of  major deformities Skin clear -no rashes Neurologic normal Lower extremities 3+ femoral and dorsalis pedis pulses palpable bilaterally with no edema Right leg has a small nest of varicosities in the medial lower leg-lower third of the great saphenous system with some very early hyperpigmentation but no thickening of the skin or active ulceration. These are prominent reticular type veins measuring an area of about 3 x 5 cm. No other varicosities are noted.  They ordered a venous duplex exam of the right leg which  are reviewed and interpreted. There is some reflux at the saphenofemoral junction on the right with a small calibered vein. There is minimal reflux in the right small saphenous vein area there is one small incompetent perforating branch in the lower leg the varicosities arise. There is no DVT.         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

## 2013-04-25 ENCOUNTER — Ambulatory Visit (INDEPENDENT_AMBULATORY_CARE_PROVIDER_SITE_OTHER): Payer: Medicare Other | Admitting: Internal Medicine

## 2013-04-25 ENCOUNTER — Encounter: Payer: Self-pay | Admitting: Internal Medicine

## 2013-04-25 VITALS — BP 98/70 | HR 78 | Temp 97.6°F | Resp 12 | Wt 174.0 lb

## 2013-04-25 DIAGNOSIS — J209 Acute bronchitis, unspecified: Secondary | ICD-10-CM | POA: Diagnosis not present

## 2013-04-25 MED ORDER — AMOXICILLIN 500 MG PO CAPS: 500.0000 mg | ORAL_CAPSULE | Freq: Three times a day (TID) | ORAL | Status: DC

## 2013-04-25 NOTE — Progress Notes (Signed)
   Subjective:    Patient ID: Adrian Rios, male    DOB: Apr 30, 1940, 73 y.o.   MRN: [id number]  HPI   Symptoms began 05/20/13 as head congestion and rhinitis  The symptoms were treated with over-the-counter cough meds with partial response  As of 3/23 he developed yellow-brown sputum which is thick and tenacious.  He's also had some itchy watery eyes.  He has some yellow nasal discharge but the volume of this is less than the volume of the chest secretions  He also has maxillary sinus pain.  Marland Kitchen  He has never smoked.    Review of Systems He denies fever, chills, or sweats  He is having no frontal sinus pain  There is no associated wheezing or shortness of breath with a cough     Objective:   Physical Exam General appearance:good health ;well nourished; no acute distress or increased work of breathing is present.  No  lymphadenopathy about the head, neck, or axilla noted.   Eyes: No conjunctival inflammation or lid edema is present. There is no scleral icterus. Ptosis present on the right. Anisocoria noted.  Ears:  External ear exam shows no significant lesions or deformities.  Otoscopic examination reveals clear canals, tympanic membranes are intact bilaterally without bulging, retraction, inflammation or discharge.TMs dull  Nose:  External nasal examination shows no deformity or inflammation. Nasal mucosa are pink and moist without lesions or exudates. No septal dislocation or deviation.No obstruction to airflow.   Oral exam: Dental hygiene is good; lips and gums are healthy appearing.There is mild oropharyngeal erythema ;noexudate noted.   Neck:  No deformities, masses, or tenderness noted.   Supple with full range of motion without pain.   Heart:  Normal rate and regular rhythm. S1 and S2 normal without gallop, murmur, click, rub or other extra sounds. S4  Lungs:Chest clear to auscultation; no wheezes, rhonchi,rales ,or rubs present.No increased work of breathing.     Extremities:  No cyanosis, edema, or clubbing  noted    Skin: Warm & dry w/o jaundice or tenting.         Assessment & Plan:  #1 acute bronchitis w/o bronchospasm #2 URI, acute Plan: See orders and recommendations

## 2013-04-25 NOTE — Progress Notes (Signed)
Pre visit review using our clinic review tool, if applicable. No additional management support is needed unless otherwise documented below in the visit note. 

## 2013-04-25 NOTE — Patient Instructions (Signed)

## 2013-04-26 DIAGNOSIS — L570 Actinic keratosis: Secondary | ICD-10-CM | POA: Diagnosis not present

## 2013-06-01 ENCOUNTER — Other Ambulatory Visit: Payer: Self-pay

## 2013-06-01 MED ORDER — FLUTICASONE-SALMETEROL 250-50 MCG/DOSE IN AEPB: INHALATION_SPRAY | RESPIRATORY_TRACT | Status: DC

## 2013-06-07 ENCOUNTER — Other Ambulatory Visit: Payer: Self-pay | Admitting: Internal Medicine

## 2013-06-07 NOTE — Telephone Encounter (Signed)
Rx sent to the pharmacy by e-script.//AB/CMA 

## 2013-07-04 ENCOUNTER — Encounter: Payer: Self-pay | Admitting: Internal Medicine

## 2013-07-04 ENCOUNTER — Ambulatory Visit (INDEPENDENT_AMBULATORY_CARE_PROVIDER_SITE_OTHER): Payer: Medicare Other | Admitting: Internal Medicine

## 2013-07-04 VITALS — BP 124/78 | HR 78 | Temp 97.4°F | Resp 14 | Wt 176.4 lb

## 2013-07-04 DIAGNOSIS — F3289 Other specified depressive episodes: Secondary | ICD-10-CM

## 2013-07-04 DIAGNOSIS — F329 Major depressive disorder, single episode, unspecified: Secondary | ICD-10-CM

## 2013-07-04 MED ORDER — VENLAFAXINE HCL 75 MG PO TABS: ORAL_TABLET | ORAL | Status: DC

## 2013-07-04 NOTE — Patient Instructions (Signed)
Please consider a Medicare Wellness exam within next 6 months. Followup as needed for your acute issue. Please report any significant change in your symptoms.

## 2013-07-04 NOTE — Progress Notes (Signed)
Pre visit review using our clinic review tool, if applicable. No additional management support is needed unless otherwise documented below in the visit note. 

## 2013-07-04 NOTE — Progress Notes (Signed)
   Subjective:    Patient ID: Adrian Rios, male    DOB: 05-29-1940, 73 y.o.   MRN: [id number]  HPI  Patient is here to monitor Venlafaxine therapy. There has been no change in the dose, brand ,mode of administration. Medication effective w/o adverse effects. See update in Problem List.    Review of Systems Constitutional: Minimal gain in weight.No significant fatigue; sleep disorder; change in appetite. Eye: no blurred, double ,loss of vision Cardiovascular: no palpitations; racing; irregularity ENT/GI: no constipation; diarrhea;hoarseness;dysphagia Derm: no change in nails,hair,skin Neuro: no numbness or tingling; tremor Endo: no temperature intolerance to heat ,cold      Objective:   Physical Exam  Gen.:  well-nourished; in no acute distress Eyes: Extraocular motion intact; no lid lag or proptosis ,nystagmus, Distorted OD lens Neck: full ROM; no masses ; thyroid normal  Heart: Normal rhythm and rate without significant murmur, gallop, or extra heart sounds Lungs: Chest clear to auscultation without rales,rales, wheezes Neuro:Deep tendon reflexes are equal and within normal limits; no tremor  Skin: Warm and dry without significant lesions or rashes; no onycholysis Lymphatic: no cervical or axillary LA Psych: Normally communicative and interactive; no abnormal mood or affect clinically.         Assessment & Plan:  See Current Assessment & Plan in Problem List under specific Diagnosis

## 2013-07-04 NOTE — Assessment & Plan Note (Signed)
Renew Rx TSH

## 2013-07-05 DIAGNOSIS — H40059 Ocular hypertension, unspecified eye: Secondary | ICD-10-CM | POA: Diagnosis not present

## 2013-07-05 DIAGNOSIS — H35379 Puckering of macula, unspecified eye: Secondary | ICD-10-CM | POA: Diagnosis not present

## 2013-07-05 DIAGNOSIS — H04129 Dry eye syndrome of unspecified lacrimal gland: Secondary | ICD-10-CM | POA: Diagnosis not present

## 2013-07-05 DIAGNOSIS — B5801 Toxoplasma chorioretinitis: Secondary | ICD-10-CM | POA: Diagnosis not present

## 2013-08-11 DIAGNOSIS — H40059 Ocular hypertension, unspecified eye: Secondary | ICD-10-CM | POA: Diagnosis not present

## 2013-08-14 ENCOUNTER — Telehealth: Payer: Self-pay | Admitting: *Deleted

## 2013-08-14 NOTE — Telephone Encounter (Signed)
Pick up or send FOB cards

## 2013-08-14 NOTE — Telephone Encounter (Signed)
Patient advised he can pick up stool cards at front desk of [place] office.

## 2013-08-14 NOTE — Telephone Encounter (Signed)
Caller name:  Sigurd Relation to pt:  self Call back number: [phone]  Pharmacy:  Reason for call:   Pt stopped by [facility] this morning, requesting prescription for Cologuard (colon cancer screening test).  Per [facility], it would be up to Dr. Linna Darner if he feels the test is needed.  Please advise the patient.  bw

## 2013-08-15 DIAGNOSIS — N423 Unspecified dysplasia of prostate: Secondary | ICD-10-CM | POA: Diagnosis not present

## 2013-08-15 DIAGNOSIS — R339 Retention of urine, unspecified: Secondary | ICD-10-CM | POA: Diagnosis not present

## 2013-08-31 ENCOUNTER — Other Ambulatory Visit (INDEPENDENT_AMBULATORY_CARE_PROVIDER_SITE_OTHER): Payer: Medicare Other

## 2013-08-31 ENCOUNTER — Other Ambulatory Visit: Payer: Self-pay | Admitting: Internal Medicine

## 2013-08-31 DIAGNOSIS — Z1211 Encounter for screening for malignant neoplasm of colon: Secondary | ICD-10-CM | POA: Diagnosis not present

## 2013-08-31 LAB — HEMOCCULT SLIDES (X 3 CARDS)
Fecal Occult Blood: NEGATIVE
OCCULT 1: NEGATIVE
OCCULT 2: NEGATIVE
OCCULT 3: NEGATIVE
OCCULT 4: NEGATIVE
OCCULT 5: NEGATIVE

## 2013-09-07 DIAGNOSIS — L821 Other seborrheic keratosis: Secondary | ICD-10-CM | POA: Diagnosis not present

## 2013-09-07 DIAGNOSIS — D485 Neoplasm of uncertain behavior of skin: Secondary | ICD-10-CM | POA: Diagnosis not present

## 2013-09-27 DIAGNOSIS — H40059 Ocular hypertension, unspecified eye: Secondary | ICD-10-CM | POA: Diagnosis not present

## 2013-11-15 ENCOUNTER — Other Ambulatory Visit: Payer: Self-pay | Admitting: Internal Medicine

## 2013-11-15 ENCOUNTER — Ambulatory Visit (INDEPENDENT_AMBULATORY_CARE_PROVIDER_SITE_OTHER): Payer: Medicare Other | Admitting: Internal Medicine

## 2013-11-15 ENCOUNTER — Encounter: Payer: Self-pay | Admitting: Internal Medicine

## 2013-11-15 ENCOUNTER — Other Ambulatory Visit (INDEPENDENT_AMBULATORY_CARE_PROVIDER_SITE_OTHER): Payer: Medicare Other

## 2013-11-15 VITALS — BP 116/86 | HR 67 | Temp 97.9°F | Resp 14 | Wt 175.2 lb

## 2013-11-15 DIAGNOSIS — E785 Hyperlipidemia, unspecified: Secondary | ICD-10-CM | POA: Diagnosis not present

## 2013-11-15 DIAGNOSIS — D649 Anemia, unspecified: Secondary | ICD-10-CM

## 2013-11-15 DIAGNOSIS — T887XXA Unspecified adverse effect of drug or medicament, initial encounter: Secondary | ICD-10-CM | POA: Diagnosis not present

## 2013-11-15 DIAGNOSIS — F329 Major depressive disorder, single episode, unspecified: Secondary | ICD-10-CM

## 2013-11-15 DIAGNOSIS — F3289 Other specified depressive episodes: Secondary | ICD-10-CM | POA: Diagnosis not present

## 2013-11-15 LAB — CBC WITH DIFFERENTIAL/PLATELET
Basophils Absolute: 0.1 10*3/uL (ref 0.0–0.1)
Basophils Relative: 0.8 % (ref 0.0–3.0)
Eosinophils Absolute: 0.4 10*3/uL (ref 0.0–0.7)
Eosinophils Relative: 4.4 % (ref 0.0–5.0)
HEMATOCRIT: 41.9 % (ref 39.0–52.0)
Hemoglobin: 14.2 g/dL (ref 13.0–17.0)
LYMPHS ABS: 2.6 10*3/uL (ref 0.7–4.0)
Lymphocytes Relative: 31.7 % (ref 12.0–46.0)
MCHC: 34 g/dL (ref 30.0–36.0)
MCV: 90.5 fl (ref 78.0–100.0)
MONO ABS: 0.5 10*3/uL (ref 0.1–1.0)
Monocytes Relative: 5.5 % (ref 3.0–12.0)
NEUTROS ABS: 4.7 10*3/uL (ref 1.4–7.7)
Neutrophils Relative %: 57.6 % (ref 43.0–77.0)
PLATELETS: 313 10*3/uL (ref 150.0–400.0)
RBC: 4.63 Mil/uL (ref 4.22–5.81)
RDW: 13.1 % (ref 11.5–15.5)
WBC: 8.2 10*3/uL (ref 4.0–10.5)

## 2013-11-15 LAB — BASIC METABOLIC PANEL
BUN: 15 mg/dL (ref 6–23)
CHLORIDE: 106 meq/L (ref 96–112)
CO2: 27 mEq/L (ref 19–32)
Calcium: 9 mg/dL (ref 8.4–10.5)
Creatinine, Ser: 0.9 mg/dL (ref 0.4–1.5)
GFR: 85.63 mL/min (ref >60.00)
GLUCOSE: 114 mg/dL — AB (ref 70–99)
POTASSIUM: 4.1 meq/L (ref 3.5–5.1)
Sodium: 139 mEq/L (ref 135–145)

## 2013-11-15 LAB — HEPATIC FUNCTION PANEL
ALBUMIN: 4 g/dL (ref 3.5–5.2)
ALT: 26 U/L (ref 0–53)
AST: 23 U/L (ref 0–37)
Alkaline Phosphatase: 80 U/L (ref 39–117)
BILIRUBIN TOTAL: 0.5 mg/dL (ref 0.2–1.2)
Bilirubin, Direct: 0.1 mg/dL (ref 0.0–0.3)
Total Protein: 7.5 g/dL (ref 6.0–8.3)

## 2013-11-15 LAB — FERRITIN: Ferritin: 99 ng/mL (ref 22.0–322.0)

## 2013-11-15 LAB — TSH: TSH: 2.31 u[IU]/mL (ref 0.35–4.50)

## 2013-11-15 NOTE — Progress Notes (Signed)
   Subjective:    Patient ID: Adrian Rios, male    DOB: Sep 22, 1940, 73 y.o.   MRN: [id number]  HPI He is here to assess active health issues & conditions. PMH, FH, & Social history verified & updated    He is on a modified heart healthy diet. He is exercising as walking weather permitting intermittently. He inadvertently came off his statin and it was not restarted.     Review of Systems    His reactive airways disease is well controlled unless he is exposed to dust. This began after he was treated for sarcoid in his early 64s. He took steroids for at least 12 months.  There is no current bone density on record.  The Effexor is very effective with no adverse effects. When he misses doses he has significant symptoms.     Objective:   Physical Exam     Positive or pertinent findings include: He appears much younger than his stated age. No alopecia is present. The right pupil is distorted. Ptosis present on the left. Left tympanic membrane is dull. Hearing is decreased bilaterally, greater on the left. There is ridging vertically of the thumbnails.  Gen.: Healthy and well-nourished in appearance. Alert, appropriate and cooperative throughout exam. Head: Normocephalic without obvious abnormalities Eyes:  Extraocular motion intact. Ears: External  ear exam reveals no significant lesions or deformities. Canals clear . Nose: External nasal exam reveals no deformity or inflammation. Nasal mucosa are pink and moist. No lesions or exudates noted.   Mouth: Oral mucosa and oropharynx reveal no lesions or exudates. Teeth in good repair. Neck: No deformities, masses, or tenderness noted. Range of motion & thyroid normal Lungs: Normal respiratory effort; chest expands symmetrically. Lungs are clear to auscultation without rales, wheezes, or increased work of breathing. Heart: Normal rate and rhythm. Normal S1 and S2. No gallop, click, or rub. No murmur. Abdomen: Bowel sounds normal; abdomen  soft and nontender. No masses, organomegaly or hernias noted. Genitalia:  as per Dr Hartley Barefoot                                 Musculoskeletal/extremities: No deformity or scoliosis noted of  the thoracic or lumbar spine.  No clubbing, cyanosis, edema, or significant extremity  deformity noted. Range of motion normal .Tone & strength normal. Hand joints normal  Able to lie down & sit up w/o help. Negative SLR bilaterally Vascular: Carotid, radial artery, dorsalis pedis and  posterior tibial pulses are full and equal. No bruits present. Neurologic: Alert and oriented x3. Deep tendon reflexes symmetrical and normal.  Gait normal      Skin: Intact without suspicious lesions or rashes. Lymph: No cervical, axillary lymphadenopathy present. Psych: Mood and affect are normal. Normally interactive                                                                                        Assessment & Plan:  See Current Assessment & Plan in Problem List under specific Diagnosis

## 2013-11-15 NOTE — Progress Notes (Signed)
Pre visit review using our clinic review tool, if applicable. No additional management support is needed unless otherwise documented below in the visit note. 

## 2013-11-15 NOTE — Assessment & Plan Note (Signed)
CBC Ferritin

## 2013-11-15 NOTE — Assessment & Plan Note (Signed)
Lipids, LFTs, TSH  

## 2013-11-15 NOTE — Assessment & Plan Note (Signed)
PSA & DRE as per Dr [name]

## 2013-11-15 NOTE — Patient Instructions (Addendum)
Your next office appointment will be determined based upon review of your pending labs. Those instructions will be transmitted to you through My Chart . Cardiovascular exercise, this can be as simple a program as walking, is recommended 30-45 minutes 3-4 times per week. If you're not exercising you should take 6-8 weeks to build up to this level.  ColoGuard may be an option in place of colonoscopy; you should explore the cost of this testing with your insurance company. If that test is positive; colonoscopy is mandatory. Medicare Part B covers this ONCE every 3 years for patient's meet the following conditions: #1 Ages 79-85 #2 No signs or symptoms of colorectal disease including but not limited to lower gastrointestinal pain; blood in the stool; positive stool cards; or abnormal immunochemical stool studies. #3 Patient must be of average risk of developing colorectal cancer and have no personal history of adenomatous polyps, colorectal cancer or inflammatory bowel disease such as Crohn's disease and ulcerative colitis. There also may be no family history of colorectal cancers or adenomatous polyps, familial adenomatous polyposis or hereditary non-polyposis colorectal cancer.

## 2013-11-15 NOTE — Assessment & Plan Note (Signed)
Renew Effexor

## 2013-11-16 ENCOUNTER — Telehealth: Payer: Self-pay

## 2013-11-16 ENCOUNTER — Ambulatory Visit: Payer: Medicare Other

## 2013-11-16 DIAGNOSIS — R7309 Other abnormal glucose: Secondary | ICD-10-CM

## 2013-11-16 LAB — HEMOGLOBIN A1C: HEMOGLOBIN A1C: 6 % (ref 4.6–6.5)

## 2013-11-16 NOTE — Telephone Encounter (Signed)
Message copied by Shelly Coss on Thu Nov 16, 2013  7:59 AM ------      Message from: Hendricks Limes      Created: Wed Nov 15, 2013  6:06 PM       Please add A1c (790.29)       ------

## 2013-11-16 NOTE — Telephone Encounter (Signed)
Add on request has been faxed to lab 

## 2013-11-17 ENCOUNTER — Encounter: Payer: Self-pay | Admitting: Internal Medicine

## 2013-11-17 LAB — NMR LIPOPROFILE WITH LIPIDS
Cholesterol, Total: 213 mg/dL — ABNORMAL HIGH (ref 100–199)
HDL PARTICLE NUMBER: 29.7 umol/L — AB (ref >=30.5)
HDL Size: 8.9 nm — ABNORMAL LOW (ref >=9.2)
HDL-C: 47 mg/dL (ref >39)
LARGE HDL: 5.3 umol/L (ref >=4.8)
LARGE VLDL-P: 8.7 nmol/L — AB (ref <=2.7)
LDL (calc): 126 mg/dL — ABNORMAL HIGH (ref 0–99)
LDL Particle Number: 2045 nmol/L — ABNORMAL HIGH (ref <1000)
LDL SIZE: 20.1 nm (ref >=20.8)
LP-IR Score: 66 — ABNORMAL HIGH (ref <=45)
Small LDL Particle Number: 1251 nmol/L — ABNORMAL HIGH (ref <=527)
Triglycerides: 201 mg/dL — ABNORMAL HIGH (ref 0–149)
VLDL Size: 47.1 nm — ABNORMAL HIGH (ref <=46.6)

## 2013-11-18 ENCOUNTER — Encounter: Payer: Self-pay | Admitting: Internal Medicine

## 2013-12-07 ENCOUNTER — Ambulatory Visit (INDEPENDENT_AMBULATORY_CARE_PROVIDER_SITE_OTHER)
Admission: RE | Admit: 2013-12-07 | Discharge: 2013-12-07 | Disposition: A | Discharge status: Home / Self Care | Payer: Medicare Other | Source: Ambulatory Visit | Attending: Internal Medicine | Admitting: Internal Medicine

## 2013-12-07 ENCOUNTER — Ambulatory Visit (INDEPENDENT_AMBULATORY_CARE_PROVIDER_SITE_OTHER): Payer: Medicare Other

## 2013-12-07 DIAGNOSIS — Z23 Encounter for immunization: Secondary | ICD-10-CM

## 2013-12-07 DIAGNOSIS — Z7951 Long term (current) use of inhaled steroids: Secondary | ICD-10-CM | POA: Diagnosis not present

## 2013-12-07 DIAGNOSIS — T50905A Adverse effect of unspecified drugs, medicaments and biological substances, initial encounter: Secondary | ICD-10-CM

## 2013-12-08 ENCOUNTER — Encounter: Payer: Self-pay | Admitting: Cardiology

## 2014-01-02 ENCOUNTER — Other Ambulatory Visit: Payer: Self-pay

## 2014-01-02 MED ORDER — MONTELUKAST SODIUM 10 MG PO TABS: ORAL_TABLET | ORAL | Status: DC

## 2014-02-13 DIAGNOSIS — R351 Nocturia: Secondary | ICD-10-CM | POA: Diagnosis not present

## 2014-02-13 DIAGNOSIS — N423 Dysplasia of prostate: Secondary | ICD-10-CM | POA: Diagnosis not present

## 2014-02-13 DIAGNOSIS — R339 Retention of urine, unspecified: Secondary | ICD-10-CM | POA: Diagnosis not present

## 2014-02-28 DIAGNOSIS — H40053 Ocular hypertension, bilateral: Secondary | ICD-10-CM | POA: Diagnosis not present

## 2014-02-28 DIAGNOSIS — H2512 Age-related nuclear cataract, left eye: Secondary | ICD-10-CM | POA: Diagnosis not present

## 2014-02-28 DIAGNOSIS — Z681 Body mass index (BMI) 19 or less, adult: Secondary | ICD-10-CM | POA: Diagnosis not present

## 2014-02-28 DIAGNOSIS — H31019 Macula scars of posterior pole (postinflammatory) (post-traumatic), unspecified eye: Secondary | ICD-10-CM | POA: Diagnosis not present

## 2014-03-06 ENCOUNTER — Telehealth: Payer: Self-pay | Admitting: Internal Medicine

## 2014-03-06 MED ORDER — VENLAFAXINE HCL 75 MG PO TABS: ORAL_TABLET | ORAL | Status: DC

## 2014-03-06 NOTE — Telephone Encounter (Signed)
Done

## 2014-03-06 NOTE — Telephone Encounter (Signed)
Pt request refill for venlafaxine (EFFEXOR) 75 MG tablet to be [facility]. Please help, pt has 4 pill left.

## 2014-05-02 DIAGNOSIS — H35371 Puckering of macula, right eye: Secondary | ICD-10-CM | POA: Diagnosis not present

## 2014-05-02 DIAGNOSIS — Z961 Presence of intraocular lens: Secondary | ICD-10-CM | POA: Diagnosis not present

## 2014-05-02 DIAGNOSIS — H43812 Vitreous degeneration, left eye: Secondary | ICD-10-CM | POA: Diagnosis not present

## 2014-06-13 DIAGNOSIS — L819 Disorder of pigmentation, unspecified: Secondary | ICD-10-CM | POA: Diagnosis not present

## 2014-06-13 DIAGNOSIS — D2262 Melanocytic nevi of left upper limb, including shoulder: Secondary | ICD-10-CM | POA: Diagnosis not present

## 2014-06-13 DIAGNOSIS — D2261 Melanocytic nevi of right upper limb, including shoulder: Secondary | ICD-10-CM | POA: Diagnosis not present

## 2014-06-13 DIAGNOSIS — D1801 Hemangioma of skin and subcutaneous tissue: Secondary | ICD-10-CM | POA: Diagnosis not present

## 2014-06-13 DIAGNOSIS — L821 Other seborrheic keratosis: Secondary | ICD-10-CM | POA: Diagnosis not present

## 2014-06-13 DIAGNOSIS — L570 Actinic keratosis: Secondary | ICD-10-CM | POA: Diagnosis not present

## 2014-06-13 DIAGNOSIS — L814 Other melanin hyperpigmentation: Secondary | ICD-10-CM | POA: Diagnosis not present

## 2014-06-13 DIAGNOSIS — D692 Other nonthrombocytopenic purpura: Secondary | ICD-10-CM | POA: Diagnosis not present

## 2014-06-13 DIAGNOSIS — B351 Tinea unguium: Secondary | ICD-10-CM | POA: Diagnosis not present

## 2014-06-13 DIAGNOSIS — Z85828 Personal history of other malignant neoplasm of skin: Secondary | ICD-10-CM | POA: Diagnosis not present

## 2014-07-24 ENCOUNTER — Other Ambulatory Visit: Payer: Self-pay | Admitting: Internal Medicine

## 2014-07-25 DIAGNOSIS — B5801 Toxoplasma chorioretinitis: Secondary | ICD-10-CM | POA: Diagnosis not present

## 2014-07-25 DIAGNOSIS — H40053 Ocular hypertension, bilateral: Secondary | ICD-10-CM | POA: Diagnosis not present

## 2014-07-25 DIAGNOSIS — H2512 Age-related nuclear cataract, left eye: Secondary | ICD-10-CM | POA: Diagnosis not present

## 2014-08-22 DIAGNOSIS — H2512 Age-related nuclear cataract, left eye: Secondary | ICD-10-CM | POA: Diagnosis not present

## 2014-08-22 DIAGNOSIS — B5801 Toxoplasma chorioretinitis: Secondary | ICD-10-CM | POA: Diagnosis not present

## 2014-08-22 DIAGNOSIS — H4051X4 Glaucoma secondary to other eye disorders, right eye, indeterminate stage: Secondary | ICD-10-CM | POA: Diagnosis not present

## 2014-08-27 ENCOUNTER — Other Ambulatory Visit: Payer: Self-pay

## 2014-08-28 ENCOUNTER — Ambulatory Visit: Payer: Medicare Other | Admitting: Internal Medicine

## 2014-08-28 DIAGNOSIS — S93401A Sprain of unspecified ligament of right ankle, initial encounter: Secondary | ICD-10-CM | POA: Diagnosis not present

## 2014-09-05 DIAGNOSIS — H2512 Age-related nuclear cataract, left eye: Secondary | ICD-10-CM | POA: Diagnosis not present

## 2014-09-05 DIAGNOSIS — H4051X4 Glaucoma secondary to other eye disorders, right eye, indeterminate stage: Secondary | ICD-10-CM | POA: Diagnosis not present

## 2014-09-05 DIAGNOSIS — B5801 Toxoplasma chorioretinitis: Secondary | ICD-10-CM | POA: Diagnosis not present

## 2014-09-07 ENCOUNTER — Other Ambulatory Visit: Payer: Self-pay | Admitting: Internal Medicine

## 2014-09-07 NOTE — Telephone Encounter (Signed)
Please advise, pt was seen in 9/15, pt cancelled last appt.

## 2014-10-26 ENCOUNTER — Ambulatory Visit (INDEPENDENT_AMBULATORY_CARE_PROVIDER_SITE_OTHER): Payer: Medicare Other | Admitting: Internal Medicine

## 2014-10-26 ENCOUNTER — Encounter: Payer: Self-pay | Admitting: Internal Medicine

## 2014-10-26 VITALS — BP 130/88 | HR 75 | Temp 97.9°F | Resp 18 | Wt 176.0 lb

## 2014-10-26 DIAGNOSIS — H6121 Impacted cerumen, right ear: Secondary | ICD-10-CM | POA: Diagnosis not present

## 2014-10-26 NOTE — Progress Notes (Signed)
   Subjective:    Patient ID: Adrian Rios, male    DOB: [birth year], 74 y.o.   MRN: [id number]  HPI   2-3 days ago he had sudden hearing loss in the right ear without any specific trigger such as water exposure or flying. There was no associated pain, discharge, or tinnitus.  He also denied any upper respiratory tract infection symptoms or extrinsic symptoms. He is essentially in pursuing the [facility] guard    Review of Systems Frontal headache, facial pain , nasal purulence, dental pain, sore throat , otic pain or otic discharge denied. No fever , chills or sweats.  Extrinsic symptoms of itchy, watery eyes, sneezing, or angioedema are denied. There is no significant cough, sputum production, wheezing,or  paroxysmal nocturnal dyspnea.  Unexplained weight loss, abdominal pain, significant dyspepsia, dysphagia, melena, rectal bleeding, or persistently small caliber stools are denied.     Objective:   Physical Exam  General appearance:Adequately nourished; no acute distress or increased work of breathing is present.    Lymphatic: No  lymphadenopathy about the head, neck, or axilla .  Eyes: No conjunctival inflammation or lid edema is present. There is no scleral icterus.  Ears:  External ear exam shows no significant lesions or deformities.  Left tympanic membrane is normal. Right canal was totally occluded with soft, tan wax.  Nose:  External nasal examination shows no deformity or inflammation. Nasal mucosa are pink and moist without lesions or exudates No septal dislocation or deviation.No obstruction to airflow.   Oral exam: Dental hygiene is good; lips and gums are healthy appearing.There is no oropharyngeal erythema or exudate .  Neck:  No deformities, thyromegaly, masses, or tenderness noted.   Supple with full range of motion without pain.   Heart:  Normal rate and regular rhythm. S1 and S2 normal without gallop, murmur, click, rub or other extra sounds.   Lungs:Chest clear to  auscultation; no wheezes, rhonchi,rales ,or rubs present.  Extremities:  No cyanosis, edema, or clubbing  noted    Skin: Warm & dry w/o tenting or jaundice. No significant lesions or rash.   After soaking and gavage the entire cerumen impaction was removed without difficulty. The tympanic membrane was normal.       Assessment & Plan:  #1 cerumen impaction, right with decreased hearing . No  tinnitus  #2 lack of surveillance colonoscopy; he wants to pursue Cologuard

## 2014-10-26 NOTE — Patient Instructions (Signed)
Please do not use Q-tips as we discussed. Should wax build up occur, please put 2-3 drops of mineral oil in the affected  ear at night to soften the wax .Cover the canal with a  cotton ball to prevent the oil from staining bed linens. In the morning fill the ear canal with hydrogen peroxide & lie in the opposite lateral decubitus position(on the side opposite the affected ear)  for 10-15 minutes. After allowing this period of time for the peroxide to dissolve the wax ;shower and use the thinnest washrag available to wick out the wax. If both ears are involved ; alternate this treatment from ear to ear each night until no wax is found on the washrag.  Colo Guard may be an option in place of colonoscopy; you should explore the cost of this testing with your insurance company. If that test is positive; colonoscopy is mandatory.

## 2014-10-26 NOTE — Progress Notes (Signed)
Pre visit review using our clinic review tool, if applicable. No additional management support is needed unless otherwise documented below in the visit note. 

## 2014-10-31 DIAGNOSIS — Z1211 Encounter for screening for malignant neoplasm of colon: Secondary | ICD-10-CM | POA: Diagnosis not present

## 2014-10-31 DIAGNOSIS — Z1212 Encounter for screening for malignant neoplasm of rectum: Secondary | ICD-10-CM | POA: Diagnosis not present

## 2014-10-31 LAB — COLOGUARD: COLOGUARD: NEGATIVE

## 2014-11-06 DIAGNOSIS — H43812 Vitreous degeneration, left eye: Secondary | ICD-10-CM | POA: Diagnosis not present

## 2014-11-06 DIAGNOSIS — B5801 Toxoplasma chorioretinitis: Secondary | ICD-10-CM | POA: Diagnosis not present

## 2014-11-06 DIAGNOSIS — H35371 Puckering of macula, right eye: Secondary | ICD-10-CM | POA: Diagnosis not present

## 2014-11-06 DIAGNOSIS — H2512 Age-related nuclear cataract, left eye: Secondary | ICD-10-CM | POA: Diagnosis not present

## 2014-11-13 ENCOUNTER — Encounter: Payer: Self-pay | Admitting: Internal Medicine

## 2014-11-14 ENCOUNTER — Ambulatory Visit (INDEPENDENT_AMBULATORY_CARE_PROVIDER_SITE_OTHER): Payer: Medicare Other | Admitting: Internal Medicine

## 2014-11-14 ENCOUNTER — Encounter: Payer: Self-pay | Admitting: Internal Medicine

## 2014-11-14 VITALS — BP 104/66 | HR 84 | Temp 98.1°F | Resp 16 | Wt 176.0 lb

## 2014-11-14 DIAGNOSIS — J209 Acute bronchitis, unspecified: Secondary | ICD-10-CM

## 2014-11-14 DIAGNOSIS — J310 Chronic rhinitis: Secondary | ICD-10-CM

## 2014-11-14 MED ORDER — AZITHROMYCIN 250 MG PO TABS: ORAL_TABLET | ORAL | Status: DC

## 2014-11-14 MED ORDER — PREDNISONE 10 MG PO TABS: ORAL_TABLET | ORAL | Status: DC

## 2014-11-14 NOTE — Progress Notes (Signed)
Pre visit review using our clinic review tool, if applicable. No additional management support is needed unless otherwise documented below in the visit note. 

## 2014-11-14 NOTE — Patient Instructions (Signed)

## 2014-11-14 NOTE — Progress Notes (Signed)
   Subjective:    Patient ID: Adrian Rios, male    DOB: June 12, 1940, 74 y.o.   MRN: [id number]  HPI  His symptoms began 11/03/14 after staying in a motel in [place]. He questions whether he was exposed to dust or mold. He developed a cough which was mainly dry except for scant clear sputum. He had associated sneezing as well as some discomfort in the temples. The cough is associated with wheezing and shortness of breath. He has continued his Singulair . He stated he used his Advair "back-to-back "on one occasion rather than every 12 hours. He also has a nasal steroid.  Review of Systems He denies any signs or symptoms of rhinosinusitis such as frontal headache, maxillary sinus pain, nasal purulence, dental pain, otic pain, otic discharge. He also denies fever, chills, sweats.     Objective:   Physical Exam  He exhibits hyponasal speech. There is marked erythema of the nasal mucosa. Breath sounds are somewhat decreased.  General appearance:Adequately nourished; no acute distress or increased work of breathing is present.    Lymphatic: No  lymphadenopathy about the head, neck, or axilla .  Eyes: No conjunctival inflammation or lid edema is present. There is no scleral icterus.  Ears:  External ear exam shows no significant lesions or deformities.  Otoscopic examination reveals clear canals, tympanic membranes are intact bilaterally without bulging, retraction, inflammation or discharge.  Nose:  External nasal examination shows no deformity or inflammation.  Oral exam: Dental hygiene is good; lips and gums are healthy appearing.There is no oropharyngeal erythema or exudate .  Neck:  No deformities, thyromegaly, masses, or tenderness noted.   Supple with full range of motion without pain.   Heart:  Normal rate and regular rhythm. S1 and S2 normal without gallop, murmur, click, rub or other extra sounds.   Lungs:Chest clear to auscultation; no wheezes, rhonchi,rales ,or rubs  present.  Extremities:  No cyanosis, edema, or clubbing  noted    Skin: Warm & dry w/o tenting or jaundice. No significant lesions or rash.       Assessment & Plan:  #1 acute bronchitis  #2 Nonallergic rhinitis  Plan: See orders and recommendations

## 2014-11-16 ENCOUNTER — Encounter: Payer: Self-pay | Admitting: Internal Medicine

## 2014-11-20 ENCOUNTER — Other Ambulatory Visit: Payer: Self-pay | Admitting: Internal Medicine

## 2014-11-20 MED ORDER — HYDROCODONE-HOMATROPINE 5-1.5 MG/5ML PO SYRP: 5.0000 mL | ORAL_SOLUTION | Freq: Three times a day (TID) | ORAL | Status: DC | PRN

## 2014-11-22 DIAGNOSIS — J45909 Unspecified asthma, uncomplicated: Secondary | ICD-10-CM | POA: Diagnosis not present

## 2014-11-22 DIAGNOSIS — H9190 Unspecified hearing loss, unspecified ear: Secondary | ICD-10-CM | POA: Diagnosis not present

## 2014-11-22 DIAGNOSIS — I493 Ventricular premature depolarization: Secondary | ICD-10-CM | POA: Diagnosis not present

## 2014-11-22 DIAGNOSIS — H409 Unspecified glaucoma: Secondary | ICD-10-CM | POA: Diagnosis not present

## 2014-11-22 DIAGNOSIS — H903 Sensorineural hearing loss, bilateral: Secondary | ICD-10-CM | POA: Diagnosis not present

## 2014-11-22 DIAGNOSIS — Z6827 Body mass index (BMI) 27.0-27.9, adult: Secondary | ICD-10-CM | POA: Diagnosis not present

## 2014-11-23 ENCOUNTER — Telehealth: Payer: Self-pay | Admitting: Emergency Medicine

## 2014-11-23 NOTE — Telephone Encounter (Signed)
[employer] results mailed to pt. Results were negative

## 2014-11-28 ENCOUNTER — Encounter: Payer: Self-pay | Admitting: Internal Medicine

## 2014-12-03 ENCOUNTER — Encounter: Payer: Self-pay | Admitting: Internal Medicine

## 2014-12-05 DIAGNOSIS — H2512 Age-related nuclear cataract, left eye: Secondary | ICD-10-CM | POA: Diagnosis not present

## 2014-12-05 DIAGNOSIS — H4051X4 Glaucoma secondary to other eye disorders, right eye, indeterminate stage: Secondary | ICD-10-CM | POA: Diagnosis not present

## 2014-12-05 DIAGNOSIS — H35373 Puckering of macula, bilateral: Secondary | ICD-10-CM | POA: Diagnosis not present

## 2014-12-18 ENCOUNTER — Other Ambulatory Visit: Payer: Self-pay | Admitting: Family

## 2014-12-18 NOTE — Telephone Encounter (Signed)
Last refill was 7/8

## 2014-12-27 ENCOUNTER — Ambulatory Visit: Payer: Medicare Other

## 2014-12-28 ENCOUNTER — Ambulatory Visit: Payer: Medicare Other

## 2015-01-03 ENCOUNTER — Ambulatory Visit: Payer: Medicare Other

## 2015-01-09 DIAGNOSIS — Z23 Encounter for immunization: Secondary | ICD-10-CM | POA: Diagnosis not present

## 2015-01-19 ENCOUNTER — Other Ambulatory Visit: Payer: Self-pay | Admitting: Internal Medicine

## 2015-01-22 ENCOUNTER — Telehealth: Payer: Self-pay | Admitting: Internal Medicine

## 2015-01-22 MED ORDER — MONTELUKAST SODIUM 10 MG PO TABS: 10.0000 mg | ORAL_TABLET | Freq: Every day | ORAL | Status: DC

## 2015-01-22 NOTE — Telephone Encounter (Signed)
[facility] called regarding pts prescription for montelukast (SINGULAIR) 10 MG tablet [id number] They had computer issues and would like for you to send this back in.

## 2015-01-22 NOTE — Telephone Encounter (Signed)
[facility]...[name]

## 2015-02-11 DIAGNOSIS — R351 Nocturia: Secondary | ICD-10-CM | POA: Diagnosis not present

## 2015-02-11 DIAGNOSIS — R339 Retention of urine, unspecified: Secondary | ICD-10-CM | POA: Diagnosis not present

## 2015-02-14 ENCOUNTER — Ambulatory Visit (INDEPENDENT_AMBULATORY_CARE_PROVIDER_SITE_OTHER): Payer: Medicare Other | Admitting: Internal Medicine

## 2015-02-14 ENCOUNTER — Encounter: Payer: Self-pay | Admitting: Internal Medicine

## 2015-02-14 VITALS — BP 108/62 | HR 89 | Temp 97.9°F | Ht 67.0 in | Wt 179.0 lb

## 2015-02-14 DIAGNOSIS — R972 Elevated prostate specific antigen [PSA]: Secondary | ICD-10-CM | POA: Diagnosis not present

## 2015-02-14 DIAGNOSIS — E785 Hyperlipidemia, unspecified: Secondary | ICD-10-CM | POA: Diagnosis not present

## 2015-02-14 DIAGNOSIS — F32A Depression, unspecified: Secondary | ICD-10-CM

## 2015-02-14 DIAGNOSIS — H409 Unspecified glaucoma: Secondary | ICD-10-CM

## 2015-02-14 DIAGNOSIS — D649 Anemia, unspecified: Secondary | ICD-10-CM | POA: Diagnosis not present

## 2015-02-14 DIAGNOSIS — F329 Major depressive disorder, single episode, unspecified: Secondary | ICD-10-CM

## 2015-02-14 MED ORDER — MONTELUKAST SODIUM 10 MG PO TABS: 10.0000 mg | ORAL_TABLET | Freq: Every day | ORAL | Status: DC

## 2015-02-14 NOTE — Progress Notes (Signed)
Pre visit review using our clinic review tool, if applicable. No additional management support is needed unless otherwise documented below in the visit note. 

## 2015-02-14 NOTE — Assessment & Plan Note (Signed)
Renew Venlafaxine

## 2015-02-14 NOTE — Assessment & Plan Note (Signed)
Lipids, LFTs, TSH  

## 2015-02-14 NOTE — Assessment & Plan Note (Signed)
As per urology

## 2015-02-14 NOTE — Patient Instructions (Signed)
  Your next office appointment will be determined based upon review of your pending labs  and  xrays  Those written interpretation of the lab results and instructions will be transmitted to you by My Chart   Critical results will be called.   Followup as needed for any active or acute issue. Please report any significant change in your symptoms. 

## 2015-02-14 NOTE — Progress Notes (Signed)
   Subjective:    Patient ID: Adrian Rios, male    DOB: [birth year], 74 y.o.   MRN: [id number]  HPI The patient is here to assess status of active health conditions.  PMH, FH, & Social History reviewed & updated.No change in [place] as recorded.  He's been compliant with his medications without adverse effects. He does increase red meat and salt in his diet but restricts fried foods. He walks 30 minutes 3-4 times per week without cardiopulmonary symptoms.  He completed Colo Guard earlier this year and this was negative. He has no active GI symptoms; except he  does have occasional dysphasia with a large glucosamine pill  He's followed by Dr. [name] for precancerous prostate changes and elevated PSA. He is on finasteride as well as tamsulosin.  He's followed by Dr. 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  Venlafaxine has been very effective in controlling depression.  Review of systems is positive for occasional leg cramps nocturnally.  Review of Systems  Chest pain, palpitations, tachycardia, exertional dyspnea, paroxysmal nocturnal dyspnea, claudication or edema are absent. No unexplained weight loss, abdominal pain, significant dyspepsia, dysphagia, melena, rectal bleeding, or persistently small caliber stools. Dysuria, pyuria, hematuria, frequency, nocturia or polyuria are denied. Change in hair, skin, nails denied. No bowel changes of constipation or diarrhea. No intolerance to heat or cold.     Objective:   Physical Exam  Pertinent or positive findings include: He has wax in both canals, greater on the left than the right. There is significant hearing loss bilaterally. He has minor crepitus in the knees. Genitourinary exam is deferred to Dr. [name].  General appearance :adequately nourished; in no distress.  Eyes: No conjunctival inflammation or scleral icterus is present.  Oral exam:  Lips and gums are healthy appearing.There  is no oropharyngeal erythema or exudate noted. Dental hygiene is good.  Heart:  Normal rate and regular rhythm. S1 and S2 normal without gallop, murmur, click, rub or other extra sounds    Lungs:Chest clear to auscultation; no wheezes, rhonchi,rales ,or rubs present.No increased work of breathing.   Abdomen: bowel sounds normal, soft and non-tender without masses, organomegaly or hernias noted.  No guarding or rebound. No flank tenderness to percussion.  Vascular : all pulses equal ; no bruits present.  Skin:Warm & dry.  Intact without suspicious lesions or rashes ; no tenting or jaundice   Lymphatic: No lymphadenopathy is noted about the head, neck, axilla.   Neuro: Strength, tone & DTRs normal.     Assessment & Plan:  See Current Assessment & Plan in Problem List under specific Diagnosis

## 2015-02-18 ENCOUNTER — Other Ambulatory Visit (INDEPENDENT_AMBULATORY_CARE_PROVIDER_SITE_OTHER): Payer: Medicare Other

## 2015-02-18 DIAGNOSIS — R351 Nocturia: Secondary | ICD-10-CM | POA: Diagnosis not present

## 2015-02-18 DIAGNOSIS — E785 Hyperlipidemia, unspecified: Secondary | ICD-10-CM | POA: Diagnosis not present

## 2015-02-18 DIAGNOSIS — N401 Enlarged prostate with lower urinary tract symptoms: Secondary | ICD-10-CM | POA: Diagnosis not present

## 2015-02-18 DIAGNOSIS — D649 Anemia, unspecified: Secondary | ICD-10-CM | POA: Diagnosis not present

## 2015-02-18 LAB — HEPATIC FUNCTION PANEL
ALK PHOS: 84 U/L (ref 39–117)
ALT: 18 U/L (ref 0–53)
AST: 17 U/L (ref 0–37)
Albumin: 3.7 g/dL (ref 3.5–5.2)
Bilirubin, Direct: 0.1 mg/dL (ref 0.0–0.3)
Total Bilirubin: 0.5 mg/dL (ref 0.2–1.2)
Total Protein: 6.6 g/dL (ref 6.0–8.3)

## 2015-02-18 LAB — CBC WITH DIFFERENTIAL/PLATELET
BASOS ABS: 0 10*3/uL (ref 0.0–0.1)
Basophils Relative: 0.4 % (ref 0.0–3.0)
Eosinophils Absolute: 0.3 10*3/uL (ref 0.0–0.7)
Eosinophils Relative: 4.1 % (ref 0.0–5.0)
HEMATOCRIT: 42 % (ref 39.0–52.0)
Hemoglobin: 13.9 g/dL (ref 13.0–17.0)
LYMPHS PCT: 26.5 % (ref 12.0–46.0)
Lymphs Abs: 2.2 10*3/uL (ref 0.7–4.0)
MCHC: 33.1 g/dL (ref 30.0–36.0)
MCV: 91.1 fl (ref 78.0–100.0)
MONOS PCT: 7.5 % (ref 3.0–12.0)
Monocytes Absolute: 0.6 10*3/uL (ref 0.1–1.0)
Neutro Abs: 5 10*3/uL (ref 1.4–7.7)
Neutrophils Relative %: 61.5 % (ref 43.0–77.0)
Platelets: 335 10*3/uL (ref 150.0–400.0)
RBC: 4.61 Mil/uL (ref 4.22–5.81)
RDW: 13.1 % (ref 11.5–15.5)
WBC: 8.1 10*3/uL (ref 4.0–10.5)

## 2015-02-18 LAB — BASIC METABOLIC PANEL
BUN: 21 mg/dL (ref 6–23)
CALCIUM: 9.2 mg/dL (ref 8.4–10.5)
CO2: 26 mEq/L (ref 19–32)
Chloride: 107 mEq/L (ref 96–112)
Creatinine, Ser: 0.9 mg/dL (ref 0.40–1.50)
GFR: 87.52 mL/min (ref >60.00)
Glucose, Bld: 104 mg/dL — ABNORMAL HIGH (ref 70–99)
Potassium: 4.2 mEq/L (ref 3.5–5.1)
SODIUM: 140 meq/L (ref 135–145)

## 2015-02-18 LAB — LIPID PANEL
CHOL/HDL RATIO: 4
Cholesterol: 160 mg/dL (ref 0–200)
HDL: 37.4 mg/dL — ABNORMAL LOW (ref >39.00)
LDL CALC: 100 mg/dL — AB (ref 0–99)
NONHDL: 122.28
Triglycerides: 110 mg/dL (ref 0.0–149.0)
VLDL: 22 mg/dL (ref 0.0–40.0)

## 2015-02-18 LAB — TSH: TSH: 2.48 u[IU]/mL (ref 0.35–4.50)

## 2015-02-20 ENCOUNTER — Other Ambulatory Visit (INDEPENDENT_AMBULATORY_CARE_PROVIDER_SITE_OTHER): Payer: Medicare Other

## 2015-02-20 ENCOUNTER — Other Ambulatory Visit: Payer: Self-pay | Admitting: Emergency Medicine

## 2015-02-20 DIAGNOSIS — H2512 Age-related nuclear cataract, left eye: Secondary | ICD-10-CM | POA: Diagnosis not present

## 2015-02-20 DIAGNOSIS — R739 Hyperglycemia, unspecified: Secondary | ICD-10-CM

## 2015-02-20 DIAGNOSIS — H4051X4 Glaucoma secondary to other eye disorders, right eye, indeterminate stage: Secondary | ICD-10-CM | POA: Diagnosis not present

## 2015-02-20 DIAGNOSIS — B5801 Toxoplasma chorioretinitis: Secondary | ICD-10-CM | POA: Diagnosis not present

## 2015-02-20 LAB — HEMOGLOBIN A1C: HEMOGLOBIN A1C: 5.9 % (ref 4.6–6.5)

## 2015-02-20 MED ORDER — FLUTICASONE-SALMETEROL 250-50 MCG/DOSE IN AEPB: INHALATION_SPRAY | RESPIRATORY_TRACT | Status: DC

## 2015-02-28 ENCOUNTER — Encounter: Payer: Self-pay | Admitting: *Deleted

## 2015-03-07 DIAGNOSIS — L820 Inflamed seborrheic keratosis: Secondary | ICD-10-CM | POA: Diagnosis not present

## 2015-03-14 ENCOUNTER — Other Ambulatory Visit: Payer: Self-pay | Admitting: Family

## 2015-04-22 DIAGNOSIS — H9190 Unspecified hearing loss, unspecified ear: Secondary | ICD-10-CM | POA: Diagnosis not present

## 2015-04-22 DIAGNOSIS — Z6827 Body mass index (BMI) 27.0-27.9, adult: Secondary | ICD-10-CM | POA: Diagnosis not present

## 2015-06-13 DIAGNOSIS — H35371 Puckering of macula, right eye: Secondary | ICD-10-CM | POA: Diagnosis not present

## 2015-06-13 DIAGNOSIS — H2512 Age-related nuclear cataract, left eye: Secondary | ICD-10-CM | POA: Diagnosis not present

## 2015-06-13 DIAGNOSIS — B5801 Toxoplasma chorioretinitis: Secondary | ICD-10-CM | POA: Diagnosis not present

## 2015-06-14 ENCOUNTER — Other Ambulatory Visit: Payer: Self-pay | Admitting: Internal Medicine

## 2015-06-18 ENCOUNTER — Telehealth: Payer: Self-pay | Admitting: Internal Medicine

## 2015-06-18 MED ORDER — VENLAFAXINE HCL 75 MG PO TABS: 75.0000 mg | ORAL_TABLET | Freq: Every day | ORAL | Status: DC

## 2015-06-18 MED ORDER — FLUTICASONE-SALMETEROL 250-50 MCG/DOSE IN AEPB: INHALATION_SPRAY | RESPIRATORY_TRACT | Status: DC

## 2015-06-18 MED ORDER — MONTELUKAST SODIUM 10 MG PO TABS: 10.0000 mg | ORAL_TABLET | Freq: Every day | ORAL | Status: DC

## 2015-06-18 NOTE — Telephone Encounter (Signed)
Spoke with pts wife to inform Rxes have been sent in

## 2015-06-18 NOTE — Telephone Encounter (Signed)
Patient would like his meds filled. He is an x patient from [place] he does had a app. On the books in June. Please follow up with patient.

## 2015-07-10 ENCOUNTER — Telehealth: Payer: Self-pay

## 2015-07-10 NOTE — Telephone Encounter (Signed)
Pt called and wanted to know if he needed an A1c done. Informed that 01/2015 appt there was an A1c done (lab add on). Pt has a follow up scheduled with PCP on 08/20/2015.

## 2015-07-19 DIAGNOSIS — H5319 Other subjective visual disturbances: Secondary | ICD-10-CM | POA: Diagnosis not present

## 2015-07-19 DIAGNOSIS — B5801 Toxoplasma chorioretinitis: Secondary | ICD-10-CM | POA: Diagnosis not present

## 2015-07-19 DIAGNOSIS — H2512 Age-related nuclear cataract, left eye: Secondary | ICD-10-CM | POA: Diagnosis not present

## 2015-07-24 DIAGNOSIS — L814 Other melanin hyperpigmentation: Secondary | ICD-10-CM | POA: Diagnosis not present

## 2015-07-24 DIAGNOSIS — D2271 Melanocytic nevi of right lower limb, including hip: Secondary | ICD-10-CM | POA: Diagnosis not present

## 2015-07-24 DIAGNOSIS — D2262 Melanocytic nevi of left upper limb, including shoulder: Secondary | ICD-10-CM | POA: Diagnosis not present

## 2015-07-24 DIAGNOSIS — L570 Actinic keratosis: Secondary | ICD-10-CM | POA: Diagnosis not present

## 2015-07-24 DIAGNOSIS — D1801 Hemangioma of skin and subcutaneous tissue: Secondary | ICD-10-CM | POA: Diagnosis not present

## 2015-07-24 DIAGNOSIS — D2261 Melanocytic nevi of right upper limb, including shoulder: Secondary | ICD-10-CM | POA: Diagnosis not present

## 2015-07-24 DIAGNOSIS — L821 Other seborrheic keratosis: Secondary | ICD-10-CM | POA: Diagnosis not present

## 2015-07-24 DIAGNOSIS — D225 Melanocytic nevi of trunk: Secondary | ICD-10-CM | POA: Diagnosis not present

## 2015-07-24 DIAGNOSIS — D2272 Melanocytic nevi of left lower limb, including hip: Secondary | ICD-10-CM | POA: Diagnosis not present

## 2015-08-20 ENCOUNTER — Encounter: Payer: Self-pay | Admitting: Internal Medicine

## 2015-08-20 ENCOUNTER — Ambulatory Visit (INDEPENDENT_AMBULATORY_CARE_PROVIDER_SITE_OTHER): Payer: Medicare Other | Admitting: Internal Medicine

## 2015-08-20 VITALS — BP 124/82 | HR 73 | Temp 98.0°F | Resp 16 | Wt 175.0 lb

## 2015-08-20 DIAGNOSIS — K219 Gastro-esophageal reflux disease without esophagitis: Secondary | ICD-10-CM | POA: Insufficient documentation

## 2015-08-20 DIAGNOSIS — C801 Malignant (primary) neoplasm, unspecified: Secondary | ICD-10-CM

## 2015-08-20 DIAGNOSIS — J209 Acute bronchitis, unspecified: Secondary | ICD-10-CM

## 2015-08-20 DIAGNOSIS — F329 Major depressive disorder, single episode, unspecified: Secondary | ICD-10-CM | POA: Diagnosis not present

## 2015-08-20 DIAGNOSIS — J3089 Other allergic rhinitis: Secondary | ICD-10-CM

## 2015-08-20 DIAGNOSIS — F32A Depression, unspecified: Secondary | ICD-10-CM

## 2015-08-20 DIAGNOSIS — Z23 Encounter for immunization: Secondary | ICD-10-CM

## 2015-08-20 DIAGNOSIS — R972 Elevated prostate specific antigen [PSA]: Secondary | ICD-10-CM

## 2015-08-20 DIAGNOSIS — E785 Hyperlipidemia, unspecified: Secondary | ICD-10-CM

## 2015-08-20 DIAGNOSIS — IMO0002 Reserved for concepts with insufficient information to code with codable children: Secondary | ICD-10-CM

## 2015-08-20 DIAGNOSIS — R7303 Prediabetes: Secondary | ICD-10-CM

## 2015-08-20 DIAGNOSIS — J4530 Mild persistent asthma, uncomplicated: Secondary | ICD-10-CM

## 2015-08-20 DIAGNOSIS — D869 Sarcoidosis, unspecified: Secondary | ICD-10-CM

## 2015-08-20 DIAGNOSIS — E663 Overweight: Secondary | ICD-10-CM

## 2015-08-20 MED ORDER — TIOTROPIUM BROMIDE MONOHYDRATE 1.25 MCG/ACT IN AERS: 2.0000 | INHALATION_SPRAY | Freq: Every day | RESPIRATORY_TRACT | Status: DC

## 2015-08-20 MED ORDER — RANITIDINE HCL 150 MG PO TABS: 150.0000 mg | ORAL_TABLET | Freq: Every day | ORAL | Status: DC

## 2015-08-20 MED ORDER — LEVALBUTEROL TARTRATE 45 MCG/ACT IN AERO
2.0000 | INHALATION_SPRAY | Freq: Four times a day (QID) | RESPIRATORY_TRACT | Status: DC | PRN
Start: 2015-08-20 — End: 2016-04-28

## 2015-08-20 NOTE — Assessment & Plan Note (Signed)
Uses advair once daily, xopenex as needed Takes claritin and singulair Will do a trial of spiriva to see if it helps his exercise tolerance - he does feel limited - if it helps will call in prescription

## 2015-08-20 NOTE — Patient Instructions (Addendum)
  Test(s) ordered today. Your results will be released to [facility] (or called to you) after review, usually within 72hours after test completion. If any changes need to be made, you will be notified at that same time.  All other Health Maintenance issues reviewed.   All recommended immunizations and age-appropriate screenings are up-to-date or discussed.  prevnar vaccine administered today.   Medications reviewed and updated.  Changes include adding zantac at night for your heartburn.  We will also try spiriva once daily to see if that helps with your exercise tolerance - a sample was given.  If it helps I can send a prescription to your pharmacy.   Your prescription(s) have been submitted to your pharmacy. Please take as directed and contact our office if you believe you are having problem(s) with the medication(s).   Please followup in  63months

## 2015-08-20 NOTE — Assessment & Plan Note (Signed)
Discussed low sugar/carb diet Exercise stressed Check a1c in 6 months

## 2015-08-20 NOTE — Assessment & Plan Note (Signed)
Sees Dr Lomax annually 

## 2015-08-20 NOTE — Assessment & Plan Note (Signed)
Dr Hartley Barefoot On proscar and flomax Symptoms controlled

## 2015-08-20 NOTE — Assessment & Plan Note (Signed)
Controlled, stable Continue current dose of medication  

## 2015-08-20 NOTE — Progress Notes (Signed)
Pre visit review using our clinic review tool, if applicable. No additional management support is needed unless otherwise documented below in the visit note. 

## 2015-08-20 NOTE — Assessment & Plan Note (Signed)
Not following with pulmonary Frequent bronchititis

## 2015-08-20 NOTE — Assessment & Plan Note (Signed)
Having gerd at night a few times a week Will start zantac at night Discussed gerd diet, avoid eating late at night

## 2015-08-20 NOTE — Progress Notes (Signed)
Subjective:    Patient ID: Adrian Rios, male    DOB: 02/27/1941, 75 y.o.   MRN: [id number]  HPI He is here to establish with a new pcp.   He is here for follow up.  Depression: He is taking his medication daily as prescribed. He denies any side effects from the medication. He feels his depression is well controlled and he is happy with his current dose of medication.  He denies anhedonia and feeling depressed or down. He denies suicidal thoughts.   BPH, elevated psa:  He is taking proscar and flomax.  He is following with urology.    RAD, allergies:  He takes the advair once a day and uses the xopenex as needed.  He uses flonase at night and takes claritin and singulair.  He uses mucinex as needed. He denies cough, wheeze and sob at at this time.  He walks the dog about a 0.5 mile a day.  He does feel limited in his physical activities by his lungs.    Sarcoidosis:  He does not following with pulmonary. He typically does well.  He has not been on steroids in a while.  He denies wheeze, cough and sob.  He has had frequent bronchitis.    Prediabetes:  He is compliant with a low sugar/carbohydrate diet.  He is exercising regularly.  Lumbar DDD: He has daily pain in lower back and upper back.  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 He also has fallen in the past and damaged his back.    Medications and allergies reviewed with patient and updated if appropriate.  Patient Active Problem List   Diagnosis Date Noted  . Glaucoma 02/14/2015  . Varicose veins of lower extremities with other complications 123456  . RAD (reactive airway disease) 10/27/2011  . 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[place]) 10/27/2011  . Anemia, unspecified 10/27/2011  . 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 ERECTILE DYSFUNCTION 04/23/2010  . [place] DISEASE, LUMBOSACRAL SPINE 02/06/2010  . 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 Depression 04/12/2008  . ALLERGIC RHINITIS 04/12/2008  . ELEVATED  PROSTATE SPECIFIC ANTIGEN 04/12/2008  . Hyperlipidemia 10/05/2006  . Sarcoidosis ([place]) 07/05/2006    Current Outpatient Prescriptions on File Prior to Visit  Medication Sig Dispense Refill  . finasteride (PROSCAR) 5 MG tablet Take 5 mg by mouth daily.    . Fluticasone-Salmeterol (ADVAIR DISKUS) 250-50 MCG/DOSE AEPB INHALE ONE DOSE BY MOUTH EVERY 12 HOURS 60 each 1  . levalbuterol (XOPENEX HFA) 45 MCG/ACT inhaler Inhale 2 puffs into the lungs every 6 (six) hours as needed for wheezing. 1 Inhaler 12  . montelukast (SINGULAIR) 10 MG tablet Take 1 tablet (10 mg total) by mouth daily. 90 tablet 0  . tamsulosin (FLOMAX) 0.4 MG CAPS capsule Take 0.4 mg by mouth.    . venlafaxine (EFFEXOR) 75 MG tablet Take 1 tablet (75 mg total) by mouth daily. 90 tablet 0   No current facility-administered medications on file prior to visit.    Past Medical History  Diagnosis Date  . RAD (reactive airway disease)   . Abnormal prostate biopsy     Dr Hartley Barefoot  . Hyperlipidemia     Past Surgical History  Procedure Laterality Date  . Prostate biopsy      X 2  . Appendectomy    . Tonsillectomy and adenoidectomy    . Squamous cell carcinoma excision      Dr Ubaldo Glassing  . Mohs surgery  nasal; pre melanoma  . Deep neck lymph node biopsy / excision      Sarcoidosis  . No colonoscopy  2009    ColoGuard neg 2016  . Macular pucker      Social History   Social History  . Marital Status: Married    Spouse Name: N/A  . Number of Children: N/A  . Years of Education: N/A   Social History Main Topics  . Smoking status: Never Smoker   . Smokeless tobacco: Not on file  . Alcohol Use: No  . Drug Use: No  . Sexual Activity: Not on file   Other Topics Concern  . Not on file   Social History Narrative    Family History  Problem Relation Age of Onset  . Stroke Mother     in her 30s  . Breast cancer Sister   . Heart disease Maternal Grandmother 84    MI   . Breast cancer Paternal Grandmother     . Heart disease Paternal Grandfather 62    MI  . Diabetes Neg Hx     Review of Systems  Respiratory: Negative for cough, shortness of breath and wheezing.   Cardiovascular: Negative for chest pain, palpitations and leg swelling.  Gastrointestinal: Negative for abdominal pain.       Gerd few times a week  Musculoskeletal: Positive for back pain (lower and upper back).  Neurological: Positive for headaches (occasional). Negative for dizziness and light-headedness.       Objective:   Filed Vitals:   08/20/15 0858  BP: 124/82  Pulse: 73  Temp: 98 F (36.7 C)  Resp: 16   Filed Weights   08/20/15 0858  Weight: 175 lb (79.379 kg)   Body mass index is 27.4 kg/(m^2).   Physical Exam Constitutional: Appears well-developed and well-nourished. No distress.  Neck: Neck supple. No tracheal deviation present. No thyromegaly present.  No carotid bruit. No cervical adenopathy.   Cardiovascular: Normal rate, regular rhythm and normal heart sounds.   No murmur heard.  No edema Pulmonary/Chest: Effort normal and breath sounds normal. No respiratory distress. No wheezes.  Psych; mood and affect normal       Assessment & Plan:   See Problem List for Assessment and Plan of chronic medical problems.  F/u in 6 months - wellness exam

## 2015-08-20 NOTE — Assessment & Plan Note (Signed)
Taking claritin daily, taking sinuglair

## 2015-08-20 NOTE — Assessment & Plan Note (Signed)
Lipid panel well controlled Not on medication F/u annually

## 2015-08-27 ENCOUNTER — Telehealth: Payer: Self-pay

## 2015-08-27 ENCOUNTER — Telehealth: Payer: Self-pay | Admitting: Emergency Medicine

## 2015-08-27 MED ORDER — ALBUTEROL SULFATE HFA 108 (90 BASE) MCG/ACT IN AERS: 2.0000 | INHALATION_SPRAY | Freq: Four times a day (QID) | RESPIRATORY_TRACT | Status: DC | PRN

## 2015-08-27 NOTE — Telephone Encounter (Signed)
Faxed received from pt's pharmacy stating Xopenex is not on pt's formulary, exception excluded.  Please advise on alternative medication, thanks!

## 2015-08-27 NOTE — Telephone Encounter (Signed)
Spoke with pt, he is okay with trying Albuterol, does not recall using it in the past. Also states that the [facility] Genre is helping. Please advise what should be sent in.

## 2015-08-27 NOTE — Telephone Encounter (Signed)
Spoke with pt to inform.  

## 2015-08-27 NOTE — Telephone Encounter (Signed)
Albuterol inhaler sent to [facility] - have him try it - more likely to cause heart racing than the xopenex.

## 2015-08-27 NOTE — Telephone Encounter (Signed)
error 

## 2015-09-09 ENCOUNTER — Other Ambulatory Visit: Payer: Self-pay | Admitting: Internal Medicine

## 2015-10-17 DIAGNOSIS — L308 Other specified dermatitis: Secondary | ICD-10-CM | POA: Diagnosis not present

## 2015-11-21 DIAGNOSIS — M5136 Other intervertebral disc degeneration, lumbar region: Secondary | ICD-10-CM | POA: Diagnosis not present

## 2015-11-21 DIAGNOSIS — M5134 Other intervertebral disc degeneration, thoracic region: Secondary | ICD-10-CM | POA: Diagnosis not present

## 2015-11-21 DIAGNOSIS — M5431 Sciatica, right side: Secondary | ICD-10-CM | POA: Diagnosis not present

## 2015-11-21 DIAGNOSIS — M9905 Segmental and somatic dysfunction of pelvic region: Secondary | ICD-10-CM | POA: Diagnosis not present

## 2015-11-21 DIAGNOSIS — M9903 Segmental and somatic dysfunction of lumbar region: Secondary | ICD-10-CM | POA: Diagnosis not present

## 2015-11-21 DIAGNOSIS — M9902 Segmental and somatic dysfunction of thoracic region: Secondary | ICD-10-CM | POA: Diagnosis not present

## 2015-11-25 DIAGNOSIS — M5136 Other intervertebral disc degeneration, lumbar region: Secondary | ICD-10-CM | POA: Diagnosis not present

## 2015-11-25 DIAGNOSIS — M5134 Other intervertebral disc degeneration, thoracic region: Secondary | ICD-10-CM | POA: Diagnosis not present

## 2015-11-25 DIAGNOSIS — M5431 Sciatica, right side: Secondary | ICD-10-CM | POA: Diagnosis not present

## 2015-11-25 DIAGNOSIS — M9905 Segmental and somatic dysfunction of pelvic region: Secondary | ICD-10-CM | POA: Diagnosis not present

## 2015-11-25 DIAGNOSIS — M9903 Segmental and somatic dysfunction of lumbar region: Secondary | ICD-10-CM | POA: Diagnosis not present

## 2015-11-25 DIAGNOSIS — M9902 Segmental and somatic dysfunction of thoracic region: Secondary | ICD-10-CM | POA: Diagnosis not present

## 2015-11-26 DIAGNOSIS — M5431 Sciatica, right side: Secondary | ICD-10-CM | POA: Diagnosis not present

## 2015-11-26 DIAGNOSIS — M9902 Segmental and somatic dysfunction of thoracic region: Secondary | ICD-10-CM | POA: Diagnosis not present

## 2015-11-26 DIAGNOSIS — M9905 Segmental and somatic dysfunction of pelvic region: Secondary | ICD-10-CM | POA: Diagnosis not present

## 2015-11-26 DIAGNOSIS — M5136 Other intervertebral disc degeneration, lumbar region: Secondary | ICD-10-CM | POA: Diagnosis not present

## 2015-11-26 DIAGNOSIS — M5134 Other intervertebral disc degeneration, thoracic region: Secondary | ICD-10-CM | POA: Diagnosis not present

## 2015-11-26 DIAGNOSIS — M9903 Segmental and somatic dysfunction of lumbar region: Secondary | ICD-10-CM | POA: Diagnosis not present

## 2015-11-28 DIAGNOSIS — M9903 Segmental and somatic dysfunction of lumbar region: Secondary | ICD-10-CM | POA: Diagnosis not present

## 2015-11-28 DIAGNOSIS — M5134 Other intervertebral disc degeneration, thoracic region: Secondary | ICD-10-CM | POA: Diagnosis not present

## 2015-11-28 DIAGNOSIS — M5136 Other intervertebral disc degeneration, lumbar region: Secondary | ICD-10-CM | POA: Diagnosis not present

## 2015-11-28 DIAGNOSIS — M9905 Segmental and somatic dysfunction of pelvic region: Secondary | ICD-10-CM | POA: Diagnosis not present

## 2015-11-28 DIAGNOSIS — M5431 Sciatica, right side: Secondary | ICD-10-CM | POA: Diagnosis not present

## 2015-11-28 DIAGNOSIS — M9902 Segmental and somatic dysfunction of thoracic region: Secondary | ICD-10-CM | POA: Diagnosis not present

## 2015-12-02 ENCOUNTER — Ambulatory Visit (INDEPENDENT_AMBULATORY_CARE_PROVIDER_SITE_OTHER): Payer: Medicare Other

## 2015-12-02 DIAGNOSIS — M5431 Sciatica, right side: Secondary | ICD-10-CM | POA: Diagnosis not present

## 2015-12-02 DIAGNOSIS — M9905 Segmental and somatic dysfunction of pelvic region: Secondary | ICD-10-CM | POA: Diagnosis not present

## 2015-12-02 DIAGNOSIS — Z23 Encounter for immunization: Secondary | ICD-10-CM | POA: Diagnosis not present

## 2015-12-02 DIAGNOSIS — M9902 Segmental and somatic dysfunction of thoracic region: Secondary | ICD-10-CM | POA: Diagnosis not present

## 2015-12-02 DIAGNOSIS — M9903 Segmental and somatic dysfunction of lumbar region: Secondary | ICD-10-CM | POA: Diagnosis not present

## 2015-12-02 DIAGNOSIS — M5134 Other intervertebral disc degeneration, thoracic region: Secondary | ICD-10-CM | POA: Diagnosis not present

## 2015-12-02 DIAGNOSIS — M5136 Other intervertebral disc degeneration, lumbar region: Secondary | ICD-10-CM | POA: Diagnosis not present

## 2015-12-03 DIAGNOSIS — M9905 Segmental and somatic dysfunction of pelvic region: Secondary | ICD-10-CM | POA: Diagnosis not present

## 2015-12-03 DIAGNOSIS — M9903 Segmental and somatic dysfunction of lumbar region: Secondary | ICD-10-CM | POA: Diagnosis not present

## 2015-12-03 DIAGNOSIS — M5134 Other intervertebral disc degeneration, thoracic region: Secondary | ICD-10-CM | POA: Diagnosis not present

## 2015-12-03 DIAGNOSIS — M9902 Segmental and somatic dysfunction of thoracic region: Secondary | ICD-10-CM | POA: Diagnosis not present

## 2015-12-03 DIAGNOSIS — M5431 Sciatica, right side: Secondary | ICD-10-CM | POA: Diagnosis not present

## 2015-12-03 DIAGNOSIS — M5136 Other intervertebral disc degeneration, lumbar region: Secondary | ICD-10-CM | POA: Diagnosis not present

## 2015-12-05 DIAGNOSIS — M9902 Segmental and somatic dysfunction of thoracic region: Secondary | ICD-10-CM | POA: Diagnosis not present

## 2015-12-05 DIAGNOSIS — M5134 Other intervertebral disc degeneration, thoracic region: Secondary | ICD-10-CM | POA: Diagnosis not present

## 2015-12-05 DIAGNOSIS — M9905 Segmental and somatic dysfunction of pelvic region: Secondary | ICD-10-CM | POA: Diagnosis not present

## 2015-12-05 DIAGNOSIS — M5136 Other intervertebral disc degeneration, lumbar region: Secondary | ICD-10-CM | POA: Diagnosis not present

## 2015-12-05 DIAGNOSIS — M5431 Sciatica, right side: Secondary | ICD-10-CM | POA: Diagnosis not present

## 2015-12-05 DIAGNOSIS — M9903 Segmental and somatic dysfunction of lumbar region: Secondary | ICD-10-CM | POA: Diagnosis not present

## 2015-12-09 DIAGNOSIS — M5134 Other intervertebral disc degeneration, thoracic region: Secondary | ICD-10-CM | POA: Diagnosis not present

## 2015-12-09 DIAGNOSIS — M9903 Segmental and somatic dysfunction of lumbar region: Secondary | ICD-10-CM | POA: Diagnosis not present

## 2015-12-09 DIAGNOSIS — M5136 Other intervertebral disc degeneration, lumbar region: Secondary | ICD-10-CM | POA: Diagnosis not present

## 2015-12-09 DIAGNOSIS — M9902 Segmental and somatic dysfunction of thoracic region: Secondary | ICD-10-CM | POA: Diagnosis not present

## 2015-12-09 DIAGNOSIS — M9905 Segmental and somatic dysfunction of pelvic region: Secondary | ICD-10-CM | POA: Diagnosis not present

## 2015-12-09 DIAGNOSIS — M5431 Sciatica, right side: Secondary | ICD-10-CM | POA: Diagnosis not present

## 2015-12-12 DIAGNOSIS — M9905 Segmental and somatic dysfunction of pelvic region: Secondary | ICD-10-CM | POA: Diagnosis not present

## 2015-12-12 DIAGNOSIS — M5134 Other intervertebral disc degeneration, thoracic region: Secondary | ICD-10-CM | POA: Diagnosis not present

## 2015-12-12 DIAGNOSIS — M9903 Segmental and somatic dysfunction of lumbar region: Secondary | ICD-10-CM | POA: Diagnosis not present

## 2015-12-12 DIAGNOSIS — M9902 Segmental and somatic dysfunction of thoracic region: Secondary | ICD-10-CM | POA: Diagnosis not present

## 2015-12-12 DIAGNOSIS — M5431 Sciatica, right side: Secondary | ICD-10-CM | POA: Diagnosis not present

## 2015-12-12 DIAGNOSIS — M5136 Other intervertebral disc degeneration, lumbar region: Secondary | ICD-10-CM | POA: Diagnosis not present

## 2015-12-16 DIAGNOSIS — Z6827 Body mass index (BMI) 27.0-27.9, adult: Secondary | ICD-10-CM | POA: Diagnosis not present

## 2015-12-16 DIAGNOSIS — M5431 Sciatica, right side: Secondary | ICD-10-CM | POA: Diagnosis not present

## 2015-12-16 DIAGNOSIS — H9190 Unspecified hearing loss, unspecified ear: Secondary | ICD-10-CM | POA: Diagnosis not present

## 2015-12-16 DIAGNOSIS — M9903 Segmental and somatic dysfunction of lumbar region: Secondary | ICD-10-CM | POA: Diagnosis not present

## 2015-12-16 DIAGNOSIS — M5136 Other intervertebral disc degeneration, lumbar region: Secondary | ICD-10-CM | POA: Diagnosis not present

## 2015-12-16 DIAGNOSIS — M5134 Other intervertebral disc degeneration, thoracic region: Secondary | ICD-10-CM | POA: Diagnosis not present

## 2015-12-16 DIAGNOSIS — M9902 Segmental and somatic dysfunction of thoracic region: Secondary | ICD-10-CM | POA: Diagnosis not present

## 2015-12-16 DIAGNOSIS — M9905 Segmental and somatic dysfunction of pelvic region: Secondary | ICD-10-CM | POA: Diagnosis not present

## 2015-12-17 ENCOUNTER — Other Ambulatory Visit: Payer: Self-pay | Admitting: Internal Medicine

## 2015-12-18 DIAGNOSIS — M9902 Segmental and somatic dysfunction of thoracic region: Secondary | ICD-10-CM | POA: Diagnosis not present

## 2015-12-18 DIAGNOSIS — M5134 Other intervertebral disc degeneration, thoracic region: Secondary | ICD-10-CM | POA: Diagnosis not present

## 2015-12-18 DIAGNOSIS — M9903 Segmental and somatic dysfunction of lumbar region: Secondary | ICD-10-CM | POA: Diagnosis not present

## 2015-12-18 DIAGNOSIS — M5136 Other intervertebral disc degeneration, lumbar region: Secondary | ICD-10-CM | POA: Diagnosis not present

## 2015-12-18 DIAGNOSIS — M5431 Sciatica, right side: Secondary | ICD-10-CM | POA: Diagnosis not present

## 2015-12-18 DIAGNOSIS — M9905 Segmental and somatic dysfunction of pelvic region: Secondary | ICD-10-CM | POA: Diagnosis not present

## 2015-12-23 DIAGNOSIS — M9902 Segmental and somatic dysfunction of thoracic region: Secondary | ICD-10-CM | POA: Diagnosis not present

## 2015-12-23 DIAGNOSIS — M9905 Segmental and somatic dysfunction of pelvic region: Secondary | ICD-10-CM | POA: Diagnosis not present

## 2015-12-23 DIAGNOSIS — M5134 Other intervertebral disc degeneration, thoracic region: Secondary | ICD-10-CM | POA: Diagnosis not present

## 2015-12-23 DIAGNOSIS — M5136 Other intervertebral disc degeneration, lumbar region: Secondary | ICD-10-CM | POA: Diagnosis not present

## 2015-12-23 DIAGNOSIS — M5431 Sciatica, right side: Secondary | ICD-10-CM | POA: Diagnosis not present

## 2015-12-23 DIAGNOSIS — M9903 Segmental and somatic dysfunction of lumbar region: Secondary | ICD-10-CM | POA: Diagnosis not present

## 2015-12-30 DIAGNOSIS — M9905 Segmental and somatic dysfunction of pelvic region: Secondary | ICD-10-CM | POA: Diagnosis not present

## 2015-12-30 DIAGNOSIS — M5134 Other intervertebral disc degeneration, thoracic region: Secondary | ICD-10-CM | POA: Diagnosis not present

## 2015-12-30 DIAGNOSIS — M5136 Other intervertebral disc degeneration, lumbar region: Secondary | ICD-10-CM | POA: Diagnosis not present

## 2015-12-30 DIAGNOSIS — M9903 Segmental and somatic dysfunction of lumbar region: Secondary | ICD-10-CM | POA: Diagnosis not present

## 2015-12-30 DIAGNOSIS — M9902 Segmental and somatic dysfunction of thoracic region: Secondary | ICD-10-CM | POA: Diagnosis not present

## 2015-12-30 DIAGNOSIS — M5431 Sciatica, right side: Secondary | ICD-10-CM | POA: Diagnosis not present

## 2016-01-02 DIAGNOSIS — M5136 Other intervertebral disc degeneration, lumbar region: Secondary | ICD-10-CM | POA: Diagnosis not present

## 2016-01-02 DIAGNOSIS — M5134 Other intervertebral disc degeneration, thoracic region: Secondary | ICD-10-CM | POA: Diagnosis not present

## 2016-01-02 DIAGNOSIS — M9905 Segmental and somatic dysfunction of pelvic region: Secondary | ICD-10-CM | POA: Diagnosis not present

## 2016-01-02 DIAGNOSIS — M9903 Segmental and somatic dysfunction of lumbar region: Secondary | ICD-10-CM | POA: Diagnosis not present

## 2016-01-02 DIAGNOSIS — M5431 Sciatica, right side: Secondary | ICD-10-CM | POA: Diagnosis not present

## 2016-01-02 DIAGNOSIS — M9902 Segmental and somatic dysfunction of thoracic region: Secondary | ICD-10-CM | POA: Diagnosis not present

## 2016-01-15 DIAGNOSIS — H35372 Puckering of macula, left eye: Secondary | ICD-10-CM | POA: Diagnosis not present

## 2016-01-15 DIAGNOSIS — B5801 Toxoplasma chorioretinitis: Secondary | ICD-10-CM | POA: Diagnosis not present

## 2016-01-15 DIAGNOSIS — H4051X4 Glaucoma secondary to other eye disorders, right eye, indeterminate stage: Secondary | ICD-10-CM | POA: Diagnosis not present

## 2016-01-15 DIAGNOSIS — H40003 Preglaucoma, unspecified, bilateral: Secondary | ICD-10-CM | POA: Diagnosis not present

## 2016-01-16 DIAGNOSIS — M9903 Segmental and somatic dysfunction of lumbar region: Secondary | ICD-10-CM | POA: Diagnosis not present

## 2016-01-16 DIAGNOSIS — M5136 Other intervertebral disc degeneration, lumbar region: Secondary | ICD-10-CM | POA: Diagnosis not present

## 2016-01-16 DIAGNOSIS — M9905 Segmental and somatic dysfunction of pelvic region: Secondary | ICD-10-CM | POA: Diagnosis not present

## 2016-01-16 DIAGNOSIS — M5134 Other intervertebral disc degeneration, thoracic region: Secondary | ICD-10-CM | POA: Diagnosis not present

## 2016-01-16 DIAGNOSIS — M5431 Sciatica, right side: Secondary | ICD-10-CM | POA: Diagnosis not present

## 2016-01-16 DIAGNOSIS — M9902 Segmental and somatic dysfunction of thoracic region: Secondary | ICD-10-CM | POA: Diagnosis not present

## 2016-02-13 ENCOUNTER — Telehealth: Payer: Self-pay | Admitting: Internal Medicine

## 2016-02-13 NOTE — Telephone Encounter (Signed)
Called patient to schedule annual wellness appointment. Left message for patient to call office to schedule appt.

## 2016-02-16 NOTE — Progress Notes (Signed)
Subjective:    Patient ID: Adrian Rios, male    DOB: [birth year], 75 y.o.   MRN: [id number]  HPI The patient is here for follow up.  Prediabetes:  He is compliant with a low sugar/carbohydrate diet.  He is exercising regularly - walks dog daily - 1-1.5 miles.  Depression: He is taking his medication daily as prescribed. He denies any side effects from the medication. He feels his depression is well controlled and he is happy with his current dose of medication.   GERD:  He is taking his medication daily as prescribed.  He has some GERD symptoms and wonders about omeprazole.  He does tend to snack at night.    BPH:  He follows with urology.  He is taking his medication daily.    His brother had a stroke a few months ago.  He has a strong family history of strokes.   Medications and allergies reviewed with patient and updated if appropriate.  Patient Active Problem List   Diagnosis Date Noted  . Prediabetes 08/20/2015  . Overweight (BMI 25.0-29.9) 08/20/2015  . Esophageal reflux 08/20/2015  . Glaucoma 02/14/2015  . Varicose veins of lower extremities with other complications 123456  . RAD (reactive airway disease) 10/27/2011  . 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 Anemia, unspecified 10/27/2011  . 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 ERECTILE DYSFUNCTION 04/23/2010  . [place] DISEASE, LUMBOSACRAL SPINE 02/06/2010  . 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 Depression 04/12/2008  . Allergic rhinitis 04/12/2008  . ELEVATED PROSTATE SPECIFIC ANTIGEN 04/12/2008  . Hyperlipidemia 10/05/2006  . Sarcoidosis ([place]) 07/05/2006    Current Outpatient Prescriptions on File Prior to Visit  Medication Sig Dispense Refill  . albuterol (PROVENTIL HFA;VENTOLIN HFA) 108 (90 Base) MCG/ACT inhaler Inhale 2 puffs into the lungs every 6 (six) hours as needed for wheezing or shortness of breath. 1 Inhaler 8  . finasteride (PROSCAR) 5 MG tablet Take 5 mg by mouth daily.      . Fluticasone-Salmeterol (ADVAIR DISKUS) 250-50 MCG/DOSE AEPB INHALE ONE DOSE BY MOUTH EVERY 12 HOURS 60 each 1  . levalbuterol (XOPENEX HFA) 45 MCG/ACT inhaler Inhale 2 puffs into the lungs every 6 (six) hours as needed for wheezing. 1 Inhaler 12  . montelukast (SINGULAIR) 10 MG tablet Take 1 tablet (10 mg total) by mouth daily. 90 tablet 0  . ranitidine (ZANTAC) 150 MG tablet Take 1 tablet (150 mg total) by mouth at bedtime. 90 tablet 1  . tamsulosin (FLOMAX) 0.4 MG CAPS capsule Take 0.4 mg by mouth.    . Tiotropium Bromide Monohydrate (SPIRIVA RESPIMAT) 1.25 MCG/ACT AERS Inhale 2 puffs into the lungs daily. 1 Inhaler 0  . venlafaxine (EFFEXOR) 75 MG tablet TAKE ONE TABLET BY MOUTH ONCE DAILY 90 tablet 1   No current facility-administered medications on file prior to visit.     Past Medical History:  Diagnosis Date  . Abnormal prostate biopsy    Dr Hartley Barefoot  . Hyperlipidemia   . RAD (reactive airway disease)     Past Surgical History:  Procedure Laterality Date  . APPENDECTOMY    . DEEP NECK LYMPH NODE BIOPSY / EXCISION     Sarcoidosis  . macular pucker    . MOHS SURGERY      nasal; pre melanoma  . no colonoscopy  2009   ColoGuard neg 2016  . PROSTATE BIOPSY     X 2  . SQUAMOUS CELL CARCINOMA EXCISION     Dr Ubaldo Glassing  .  TONSILLECTOMY AND ADENOIDECTOMY      Social History   Social History  . Marital status: Married    Spouse name: N/A  . Number of children: N/A  . Years of education: N/A   Social History Main Topics  . Smoking status: Never Smoker  . Smokeless tobacco: None  . Alcohol use No  . Drug use: No  . Sexual activity: Not Asked   Other Topics Concern  . None   Social History Narrative  . None    Family History  Problem Relation Age of Onset  . Stroke Mother     in her 19s  . Breast cancer Sister   . Heart disease Maternal Grandmother 84    MI   . Breast cancer Paternal Grandmother   . Heart disease Paternal Grandfather 66    MI  . Stroke  Brother   . Diabetes Neg Hx     Review of Systems  Constitutional: Negative for fever.  Respiratory: Positive for cough (with uri) and shortness of breath (with recent cold). Negative for wheezing.   Cardiovascular: Negative for chest pain, palpitations and leg swelling.  Gastrointestinal: Negative for abdominal pain.  Neurological: Positive for headaches (occ, mild). Negative for dizziness and light-headedness.       Objective:   Vitals:   02/17/16 0927  BP: 104/70  Pulse: 75  Resp: 16  Temp: 97.9 F (36.6 C)   Filed Weights   02/17/16 0927  Weight: 177 lb (80.3 kg)   Body mass index is 27.72 kg/m.   Physical Exam    Constitutional: Appears well-developed and well-nourished. No distress.  HENT:  Head: Normocephalic and atraumatic.  Neck: Neck supple. No tracheal deviation present. No thyromegaly present.  No cervical lymphadenopathy Cardiovascular: Normal rate, regular rhythm and normal heart sounds.   No murmur heard. ? Left carotid bruit .  No edema Pulmonary/Chest: Effort normal and breath sounds normal. No respiratory distress. No has no wheezes. No rales.  Skin: Skin is warm and dry. Not diaphoretic.  Psychiatric: Normal mood and affect. Behavior is normal.      Assessment & Plan:    See Problem List for Assessment and Plan of chronic medical problems.

## 2016-02-17 ENCOUNTER — Encounter: Payer: Self-pay | Admitting: Internal Medicine

## 2016-02-17 ENCOUNTER — Other Ambulatory Visit (INDEPENDENT_AMBULATORY_CARE_PROVIDER_SITE_OTHER): Payer: Medicare Other

## 2016-02-17 ENCOUNTER — Ambulatory Visit (INDEPENDENT_AMBULATORY_CARE_PROVIDER_SITE_OTHER): Payer: Medicare Other | Admitting: Internal Medicine

## 2016-02-17 VITALS — BP 104/70 | HR 75 | Temp 97.9°F | Resp 16 | Wt 177.0 lb

## 2016-02-17 DIAGNOSIS — E785 Hyperlipidemia, unspecified: Secondary | ICD-10-CM | POA: Diagnosis not present

## 2016-02-17 DIAGNOSIS — R7303 Prediabetes: Secondary | ICD-10-CM | POA: Diagnosis not present

## 2016-02-17 DIAGNOSIS — F32A Depression, unspecified: Secondary | ICD-10-CM

## 2016-02-17 DIAGNOSIS — F329 Major depressive disorder, single episode, unspecified: Secondary | ICD-10-CM

## 2016-02-17 DIAGNOSIS — K219 Gastro-esophageal reflux disease without esophagitis: Secondary | ICD-10-CM | POA: Diagnosis not present

## 2016-02-17 DIAGNOSIS — R0989 Other specified symptoms and signs involving the circulatory and respiratory systems: Secondary | ICD-10-CM

## 2016-02-17 LAB — LIPID PANEL
CHOLESTEROL: 168 mg/dL (ref 0–200)
HDL: 44.8 mg/dL (ref >39.00)
LDL Cholesterol: 96 mg/dL (ref 0–99)
NonHDL: 123.42
Total CHOL/HDL Ratio: 4
Triglycerides: 138 mg/dL (ref 0.0–149.0)
VLDL: 27.6 mg/dL (ref 0.0–40.0)

## 2016-02-17 LAB — COMPREHENSIVE METABOLIC PANEL
ALBUMIN: 4 g/dL (ref 3.5–5.2)
ALT: 17 U/L (ref 0–53)
AST: 15 U/L (ref 0–37)
Alkaline Phosphatase: 78 U/L (ref 39–117)
BUN: 19 mg/dL (ref 6–23)
CALCIUM: 9.3 mg/dL (ref 8.4–10.5)
CHLORIDE: 106 meq/L (ref 96–112)
CO2: 26 meq/L (ref 19–32)
CREATININE: 0.9 mg/dL (ref 0.40–1.50)
GFR: 87.29 mL/min (ref >60.00)
Glucose, Bld: 102 mg/dL — ABNORMAL HIGH (ref 70–99)
POTASSIUM: 4.2 meq/L (ref 3.5–5.1)
Sodium: 140 mEq/L (ref 135–145)
Total Bilirubin: 0.4 mg/dL (ref 0.2–1.2)
Total Protein: 6.8 g/dL (ref 6.0–8.3)

## 2016-02-17 LAB — HEMOGLOBIN A1C: HEMOGLOBIN A1C: 5.8 % (ref 4.6–6.5)

## 2016-02-17 MED ORDER — ASPIRIN EC 81 MG PO TBEC: 81.0000 mg | DELAYED_RELEASE_TABLET | Freq: Every day | ORAL | Status: DC

## 2016-02-17 NOTE — Assessment & Plan Note (Signed)
GERD controlled Continue daily medication  

## 2016-02-17 NOTE — Progress Notes (Signed)
Pre visit review using our clinic review tool, if applicable. No additional management support is needed unless otherwise documented below in the visit note. 

## 2016-02-17 NOTE — Patient Instructions (Signed)
  Test(s) ordered today. Your results will be released to [place] (or called to you) after review, usually within 72hours after test completion. If any changes need to be made, you will be notified at that same time.  All other Health Maintenance issues reviewed.   All recommended immunizations and age-appropriate screenings are up-to-date or discussed.  No immunizations administered today.   Medications reviewed and updated.  No changes recommended at this time.   An ultrasound of your carotids were ordered.    Please followup in 6 months

## 2016-02-17 NOTE — Assessment & Plan Note (Signed)
Check a1c Continue diabetic diet and exercise Work on  Weight loss

## 2016-02-17 NOTE — Assessment & Plan Note (Addendum)
GERD not ideally controlled Stressed GERD diet, weight loss, avoid snacking at night Discussed options - discussed concerns with PPI  Continue daily medication - zantac for now Will start omeprazole if symptoms are not controlled with lifestyle changes and zantac

## 2016-02-17 NOTE — Assessment & Plan Note (Signed)
?   Carotid bruit - will check carotid US especially given strong fam hx of stroke Start ASA 81 mg daily Consider statin depending on results

## 2016-02-17 NOTE — Assessment & Plan Note (Signed)
Check lipid panel Continue exercise Work on weight loss

## 2016-02-29 DIAGNOSIS — R310 Gross hematuria: Secondary | ICD-10-CM | POA: Diagnosis not present

## 2016-03-03 DIAGNOSIS — R310 Gross hematuria: Secondary | ICD-10-CM | POA: Diagnosis not present

## 2016-03-03 DIAGNOSIS — Z125 Encounter for screening for malignant neoplasm of prostate: Secondary | ICD-10-CM | POA: Diagnosis not present

## 2016-03-05 DIAGNOSIS — R310 Gross hematuria: Secondary | ICD-10-CM | POA: Diagnosis not present

## 2016-03-10 ENCOUNTER — Telehealth: Payer: Self-pay | Admitting: Emergency Medicine

## 2016-03-10 DIAGNOSIS — M9905 Segmental and somatic dysfunction of pelvic region: Secondary | ICD-10-CM | POA: Diagnosis not present

## 2016-03-10 DIAGNOSIS — M5136 Other intervertebral disc degeneration, lumbar region: Secondary | ICD-10-CM | POA: Diagnosis not present

## 2016-03-10 DIAGNOSIS — M9902 Segmental and somatic dysfunction of thoracic region: Secondary | ICD-10-CM | POA: Diagnosis not present

## 2016-03-10 DIAGNOSIS — M9903 Segmental and somatic dysfunction of lumbar region: Secondary | ICD-10-CM | POA: Diagnosis not present

## 2016-03-10 DIAGNOSIS — M5431 Sciatica, right side: Secondary | ICD-10-CM | POA: Diagnosis not present

## 2016-03-10 DIAGNOSIS — M5134 Other intervertebral disc degeneration, thoracic region: Secondary | ICD-10-CM | POA: Diagnosis not present

## 2016-03-10 NOTE — Telephone Encounter (Signed)
Please advise what should be done.  

## 2016-03-10 NOTE — Telephone Encounter (Signed)
Pt called and has blood in his urine. He also had a CT scan done and he had abnormalities in his bladder. He cant get in to see anybody across the the street until Feb. He wants to know if theres a way to speed this up. Please advise thanks.

## 2016-03-10 NOTE — Telephone Encounter (Signed)
Can Adrian Rios or Adrian Rios call over to urology and ask if we refer him if it will be quicker- he is an established patient so it may not.    We can also have him bring in a urine sample - other than that there is not much more we can do

## 2016-03-11 ENCOUNTER — Ambulatory Visit (HOSPITAL_COMMUNITY)
Admission: RE | Admit: 2016-03-11 | Discharge: 2016-03-11 | Disposition: A | Discharge status: Home / Self Care | Payer: Medicare Other | Source: Ambulatory Visit | Attending: Cardiovascular Disease | Admitting: Cardiovascular Disease

## 2016-03-11 DIAGNOSIS — R0989 Other specified symptoms and signs involving the circulatory and respiratory systems: Secondary | ICD-10-CM

## 2016-03-11 DIAGNOSIS — I6521 Occlusion and stenosis of right carotid artery: Secondary | ICD-10-CM | POA: Diagnosis not present

## 2016-03-11 DIAGNOSIS — E785 Hyperlipidemia, unspecified: Secondary | ICD-10-CM | POA: Diagnosis not present

## 2016-03-11 DIAGNOSIS — Z823 Family history of stroke: Secondary | ICD-10-CM | POA: Insufficient documentation

## 2016-03-11 NOTE — Telephone Encounter (Signed)
Spoke with pt to inform. Informed pt to call back if symptoms seem to worsen.

## 2016-03-11 NOTE — Telephone Encounter (Signed)
I called [facility] and they stated pt has an appointment on 1/23.

## 2016-03-13 ENCOUNTER — Telehealth: Payer: Self-pay | Admitting: Emergency Medicine

## 2016-03-13 ENCOUNTER — Other Ambulatory Visit: Payer: Self-pay | Admitting: Urology

## 2016-03-13 DIAGNOSIS — R310 Gross hematuria: Secondary | ICD-10-CM | POA: Diagnosis not present

## 2016-03-13 DIAGNOSIS — R339 Retention of urine, unspecified: Secondary | ICD-10-CM | POA: Diagnosis not present

## 2016-03-13 NOTE — Telephone Encounter (Signed)
Spoke with pt this morning for lab results, pt states that he did see Dr [name] this morning and he was informed that he does have bladder cancer. Dr  [name] will need to know by Dr Quay Burow that he is fit to have surgery. Please advise.

## 2016-03-14 NOTE — Telephone Encounter (Signed)
Letter written

## 2016-03-16 ENCOUNTER — Other Ambulatory Visit: Payer: Self-pay | Admitting: Urology

## 2016-03-16 NOTE — Telephone Encounter (Signed)
Letter has been faxed.

## 2016-03-20 DIAGNOSIS — R310 Gross hematuria: Secondary | ICD-10-CM | POA: Diagnosis not present

## 2016-03-20 DIAGNOSIS — C679 Malignant neoplasm of bladder, unspecified: Secondary | ICD-10-CM | POA: Diagnosis not present

## 2016-03-20 DIAGNOSIS — N3289 Other specified disorders of bladder: Secondary | ICD-10-CM | POA: Diagnosis not present

## 2016-03-20 DIAGNOSIS — Z6827 Body mass index (BMI) 27.0-27.9, adult: Secondary | ICD-10-CM | POA: Diagnosis not present

## 2016-03-28 DIAGNOSIS — W460XXA Contact with hypodermic needle, initial encounter: Secondary | ICD-10-CM | POA: Diagnosis not present

## 2016-03-28 DIAGNOSIS — S61231A Puncture wound without foreign body of left index finger without damage to nail, initial encounter: Secondary | ICD-10-CM | POA: Diagnosis not present

## 2016-04-02 ENCOUNTER — Encounter (HOSPITAL_BASED_OUTPATIENT_CLINIC_OR_DEPARTMENT_OTHER): Payer: Self-pay | Admitting: *Deleted

## 2016-04-02 NOTE — Progress Notes (Signed)
NPO AFTER MN.  ARRIVE AT 0900.  NEEDS ISTAT AND EKG.  WILL TAKE EFFEXOR, FLOMAX, AND DO ADVAIR INHALER AM DOS W/ SIPS OF WATER.  ADVISED PT TO START DOING ADVAIR INHALER TWICE DAILY AS PRESCRIBED TODAY, VERBALIZED HE WILL.  WILL BRING RESCUE INHALER.

## 2016-04-03 NOTE — H&P (Signed)
Office Visit Report     03/13/2016   --------------------------------------------------------------------------------   Adrian Rios  MRN: [id number]  PRIMARY CARE:    DOB: 1940/10/15, 76 year old Male  REFERRING:  Jimmey Ralph, NP  SSN:  PROVIDER:  Carolan Clines, M.D.    LOCATION:  [facility] [phone]   --------------------------------------------------------------------------------   CC: I have blood in my urine.  HPI: Adrian Rios is a 76 year-old male established patient who is here for blood in the urine.  He did see the blood in his urine. He first noticed the symptoms approximately 02/27/2016. He has not seen blood clots.   He does not have a burning sensation when he urinates. He is not currently having trouble urinating.   He is not having pain. He has not recently had unwanted weight loss.   His last U/S or CT Scan was 03/05/2016.   Seen at an Urgent Care over weekend of New Year's and told he did not have UTI. C&S was repeated on 03/03/16 which was negative.  CT scan 01.04/18 shows L sided bladder tic with bladder tumor.     CC: I have an enlarged prostate (follow-up).  HPI: He is currently on flomax, rapaflo, and finasteride for the symptoms due to the enlarged prostate gland. He is not on new medications for symptoms of prostate enlargement.   He does not have an abnormal sensation when needing to urinate. He is not having problems getting his urine stream started. He does have a good size and strength to his urinary stream. He is not having problems with emptying his bladder well. He does not dribble at the end of urination.   He is s/p a Pbx in 2007, with a hx of HgPIN. Last PSA on 03/03/16 was 0.56 (true PSA was 1.12 due to taking Finasteride). Treated with tamsulosin and finasteride.      AUA Symptom Score: Less than 50% of the time he has the sensation of not emptying his bladder completely when finished urinating. He never has to urinate  again less that two hours after he has finished urinating. He does not have to stop and start again several times when he urinates. Less than 20% of the time he finds it difficult to postpone urination. Less than 20% of the time he has a weak urinary stream. Less than 20% of the time he has to push or strain to begin urination. He has to get up to urinate 1 time from the time he goes to bed until the time he gets up in the morning.   Calculated AUA Symptom Score: 6    QOL Score: He would feel mostly satisfied if he had to live with his urinary condition the way it is now for the rest of his life.   Calculated QOL Symptom Score: 2    ALLERGIES: Crab - Skin Rash, Itching Iodine - Trouble Breathing, Itching, Redness    MEDICATIONS: Finasteride 5 mg tablet TAKE ONE TABLET BY MOUTH ONCE DAILY  Tamsulosin Hcl 0.4 mg capsule, ext release 24 hr TAKE TWO CAPSULES BY MOUTH ONCE DAILY  Advair Diskus MISC Inhalation  Prednisone 50 mg tablet 1 13 hrs prior to proceudre. 1 7 hrs prior to procedure. 1 1hr prior to procedure.  Singulair 10 MG Oral Tablet Oral  Venlafaxine HCl - 75 MG Oral Tablet Oral     GU PSH: Locm 300-399Mg /Ml Iodine,1Ml - 03/05/2016      PSH Notes: Eye Surgery   NON-GU  PSH: Appendectomy - 06-19-2006    GU PMH: Gross hematuria (Acute), Proceed with hematuria workup with CT and cysto. BUN/creatine today. PSA today. Culture urine. 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 Urinary Retention, Unspec, Incomplete bladder emptying - 02/13/2014 Oth GU systems Signs/Symptoms, Bladder pain - 06/18/2012      PMH Notes:  2006-02-24 10:01:10 - Note: Arthritis   NON-GU PMH: Encounter for general adult medical examination without abnormal findings, Encounter for preventive health examination - June 18, 2012 Asthma, Asthma -  2012/06/18 Cardiac murmur, unspecified, Murmurs - 2012-06-18 Personal history of other diseases of the digestive system, History of esophageal reflux - 18-Jun-2012 Personal history of other endocrine, nutritional and metabolic disease, History of hypercholesterolemia - Jun 18, 2012 Personal history of other mental and behavioral disorders, History of depression - Jun 18, 2012    FAMILY HISTORY: Death In The Family Father - Runs In Family Death In The Family Mother - Runs In [facility] Family Health Status Number - Father stroke - Brother Urologic Disorder - Father   SOCIAL HISTORY: Marital Status: Married Current Smoking Status: Patient has never smoked.   Tobacco Use Assessment Completed: Used Tobacco in last 30 days? Does not use smokeless tobacco. Has never drank.  Does not use drugs. Drinks 2 caffeinated drinks per day. Has not had a blood transfusion.    REVIEW OF SYSTEMS:    GU Review Male:   Patient denies frequent urination, hard to postpone urination, burning/ pain with urination, get up at night to urinate, leakage of urine, stream starts and stops, trouble starting your stream, have to strain to urinate , erection problems, and penile pain.  Gastrointestinal (Upper):   Patient denies nausea, vomiting, and indigestion/ heartburn.  Gastrointestinal (Lower):   Patient denies diarrhea and constipation.  Constitutional:   Patient denies fever, night sweats, weight loss, and fatigue.  Skin:   Patient denies skin rash/ lesion and itching.  Eyes:   Patient denies blurred vision and double vision.  Ears/ Nose/ Throat:   Patient denies sore throat and sinus problems.  Hematologic/Lymphatic:   Patient denies swollen glands and easy bruising.  Cardiovascular:   Patient denies leg swelling and chest pains.  Respiratory:   Patient denies cough and shortness of breath.  Endocrine:   Patient denies excessive thirst.  Musculoskeletal:   Patient denies joint pain and back pain.  Neurological:   Patient denies headaches and  dizziness.  Psychologic:   Patient denies depression and anxiety.   VITAL SIGNS:      03/13/2016 09:04 AM  BP 117/65 mmHg  Pulse 81 /min  Temperature 97.8 F / 37 C   GU PHYSICAL EXAMINATION:    Anus and Perineum: No hemorrhoids. No anal stenosis. No rectal fissure, no anal fissure. No edema, no dimple, no perineal tenderness, no anal tenderness.  Scrotum: No lesions. No edema. No cysts. No warts.  Epididymides: Right: no spermatocele, no masses, no cysts, no tenderness, no induration, no enlargement. Left: no spermatocele, no masses, no cysts, no tenderness, no induration, no enlargement.  Testes: No tenderness, no swelling, no enlargement left testes. No tenderness, no swelling, no enlargement right testes. Normal location left testes. Normal location right testes. No mass, no cyst, no varicocele, no hydrocele left testes. No mass, no cyst, no varicocele, no hydrocele right testes.  Urethral Meatus: Normal size. No lesion, no wart, no discharge, no polyp. Normal location.  Penis: Circumcised, no warts, no cracks. No dorsal Peyronie's plaques, no left corporal Peyronie's plaques, no right corporal Peyronie's plaques, no scarring, no warts. No balanitis, no meatal stenosis.  Prostate: Prostate 3 + size. Left lobe normal consistency, right lobe normal consistency. Symmetrical lobes. No prostate nodule. Left lobe no tenderness, right lobe no tenderness.   Seminal Vesicles: Nonpalpable.  Sphincter Tone: Normal sphincter. No rectal tenderness. No rectal mass.    MULTI-SYSTEM PHYSICAL EXAMINATION:    Constitutional: Thin. No physical deformities. Normally developed. Good grooming.   Neck: Neck symmetrical, not swollen. Normal tracheal position.  Respiratory: No labored breathing, no use of accessory muscles.   Cardiovascular: Normal temperature, normal extremity pulses, no swelling, no varicosities.  Lymphatic: No enlargement of neck, axillae, groin.  Neurologic / Psychiatric: Oriented to time,  oriented to place, oriented to person. No depression, no anxiety, no agitation.  Gastrointestinal: No mass, no tenderness, no rigidity, non obese abdomen.  Eyes: Normal conjunctivae. Normal eyelids.  Ears, Nose, Mouth, and Throat: Left ear no scars, no lesions, no masses. Right ear no scars, no lesions, no masses. Nose no scars, no lesions, no masses. Normal hearing. Normal lips.  Musculoskeletal: Normal gait and station of head and neck.     PAST DATA REVIEWED:  Source Of History:  Patient  Records Review:   Previous Patient Records  X-Ray Review: C.T. Abdomen/Pelvis: Reviewed Films. Reviewed Report. Discussed With Patient.     03/03/16 02/12/15 08/15/13 02/10/13 02/10/12 10/28/10 07/01/09 06/27/08  PSA  Total PSA 0.56  0.55  0.52  0.52  0.39  0.40  0.50  0.54   Free PSA      <0.1     % Free PSA      15       03/13/16  Urinalysis  Urine Appearance Cloudy   Urine Color Yellow   Urine Glucose Neg   Urine Bilirubin Neg   Urine Ketones Neg   Urine Specific Gravity 1.020   Urine Blood 2+   Urine pH 6.5   Urine Protein Trace   Urine Urobilinogen 0.2   Urine Nitrites Neg   Urine Leukocyte Esterase Neg   Urine WBC/hpf 6 - 10/hpf   Urine RBC/hpf 10 - 20/hpf   Urine Epithelial Cells 0 - 5/hpf   Urine Bacteria Few (10-25/hpf)   Urine Mucous Not Present   Urine Yeast NS (Not Seen)   Urine Trichomonas Not Present   Urine Cystals Amorph Urates   Urine Casts NS (Not Seen)   Urine Sperm Not Present    PROCEDURES:         Flexible Cystoscopy - 52000  Risks, benefits, and some of the potential complications of the procedure were discussed at length with the patient including infection, bleeding, voiding discomfort, urinary retention, fever, chills, sepsis, and others. All questions were answered. Informed consent was obtained. Antibiotic prophylaxis was given. Sterile technique and intraurethral analgesia were used.  Meatus:  Normal size. Normal location. Normal condition.  Urethra:  No  strictures.  External Sphincter:  Normal.  Verumontanum:  Normal.  Prostate:  Non-obstructing. No hyperplasia.  Bladder Neck:  Non-obstructing.  Ureteral Orifices:  Normal location. Normal size. Normal shape. Effluxed clear urine.  Bladder:  A left sided diverticulum with tumor. A left lateral wall tumor. A few posterior wall tumors. A dome tumor. 3+ cm tumor. Multifocal tumors. Normal mucosa. No stones.      The lower urinary tract was carefully examined. The procedure was well-tolerated and without complications. Antibiotic instructions  were given. Instructions were given to call the office immediately for bloody urine, difficulty urinating, urinary retention, painful or frequent urination, fever, chills, nausea, vomiting or other illness. The patient stated that he understood these instructions and would comply with them.         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 He has no tobacco exposure, and no place chemical exposure. His past history is significant for sarcoidosis  since adulthood.   CT scan shows left-sided bladder diverticulum, and bladder tumor at the edge of the bladder diverticulum. In addition, the patient has bladder tumor on the bladder base, proximal to the trigone; as well as the posterior wall, and the bladder dome.   Note the patient is allergic to iodine.   He will need pulmonary clearance from Dr. Billey Gosling prior to surgery. We will approach him with transurethral resection of multiple bladder tumors, but will avoid intraoperative  chemotherapy because of the thin wall of the diverticulum requiring resection. He will need postoperative BCG when healed.    PLAN:           Schedule Return Visit/Planned Activity: Next Available Appointment - Schedule Surgery          Document Letter(s):  Created for Patient: Clinical Summary         Notes:   cc: Dr. Celso Amy.     Signed by Carolan Clines, M.D. on 03/13/16 at 10:00 AM (EST)     The information contained in this medical record document is considered private and confidential patient information. This information can only be used for the medical diagnosis and/or medical services that are being provided by the patient's selected caregivers. This information can only be distributed outside of the patient's care if the patient agrees and signs waivers of authorization for this information to be sent to an outside source or route.

## 2016-04-06 ENCOUNTER — Ambulatory Visit (HOSPITAL_BASED_OUTPATIENT_CLINIC_OR_DEPARTMENT_OTHER): Payer: Medicare Other | Admitting: Anesthesiology

## 2016-04-06 ENCOUNTER — Encounter (HOSPITAL_BASED_OUTPATIENT_CLINIC_OR_DEPARTMENT_OTHER)
Admission: RE | Disposition: A | Discharge status: Home / Self Care | Payer: Self-pay | Source: Ambulatory Visit | Attending: Urology

## 2016-04-06 ENCOUNTER — Encounter (HOSPITAL_BASED_OUTPATIENT_CLINIC_OR_DEPARTMENT_OTHER): Payer: Self-pay | Admitting: Anesthesiology

## 2016-04-06 ENCOUNTER — Ambulatory Visit (HOSPITAL_BASED_OUTPATIENT_CLINIC_OR_DEPARTMENT_OTHER)
Admission: RE | Admit: 2016-04-06 | Discharge: 2016-04-06 | Disposition: A | Discharge status: Home / Self Care | Payer: Medicare Other | Source: Ambulatory Visit | Attending: Urology | Admitting: Urology

## 2016-04-06 DIAGNOSIS — N3091 Cystitis, unspecified with hematuria: Secondary | ICD-10-CM | POA: Insufficient documentation

## 2016-04-06 DIAGNOSIS — C672 Malignant neoplasm of lateral wall of bladder: Secondary | ICD-10-CM

## 2016-04-06 DIAGNOSIS — M1990 Unspecified osteoarthritis, unspecified site: Secondary | ICD-10-CM | POA: Insufficient documentation

## 2016-04-06 DIAGNOSIS — D494 Neoplasm of unspecified behavior of bladder: Secondary | ICD-10-CM | POA: Diagnosis not present

## 2016-04-06 DIAGNOSIS — N400 Enlarged prostate without lower urinary tract symptoms: Secondary | ICD-10-CM | POA: Insufficient documentation

## 2016-04-06 DIAGNOSIS — K219 Gastro-esophageal reflux disease without esophagitis: Secondary | ICD-10-CM | POA: Diagnosis not present

## 2016-04-06 DIAGNOSIS — C677 Malignant neoplasm of urachus: Secondary | ICD-10-CM | POA: Insufficient documentation

## 2016-04-06 DIAGNOSIS — C671 Malignant neoplasm of dome of bladder: Secondary | ICD-10-CM | POA: Diagnosis not present

## 2016-04-06 DIAGNOSIS — J45909 Unspecified asthma, uncomplicated: Secondary | ICD-10-CM | POA: Insufficient documentation

## 2016-04-06 DIAGNOSIS — N323 Diverticulum of bladder: Secondary | ICD-10-CM | POA: Insufficient documentation

## 2016-04-06 DIAGNOSIS — C7911 Secondary malignant neoplasm of bladder: Secondary | ICD-10-CM | POA: Diagnosis not present

## 2016-04-06 HISTORY — DX: Benign prostatic hyperplasia with lower urinary tract symptoms: N40.1

## 2016-04-06 HISTORY — DX: Depression, unspecified: F32.A

## 2016-04-06 HISTORY — DX: Major depressive disorder, single episode, unspecified: F32.9

## 2016-04-06 HISTORY — DX: Other specified postprocedural states: Z85.9

## 2016-04-06 HISTORY — DX: Preglaucoma, unspecified, right eye: H40.001

## 2016-04-06 HISTORY — DX: Allergic rhinitis, unspecified: J30.9

## 2016-04-06 HISTORY — PX: TRANSURETHRAL RESECTION OF BLADDER TUMOR: SHX2575

## 2016-04-06 HISTORY — DX: Male erectile dysfunction, unspecified: N52.9

## 2016-04-06 HISTORY — DX: Gastro-esophageal reflux disease without esophagitis: K21.9

## 2016-04-06 HISTORY — DX: Neoplasm of unspecified behavior of bladder: D49.4

## 2016-04-06 HISTORY — DX: Prediabetes: R73.03

## 2016-04-06 HISTORY — DX: Presence of spectacles and contact lenses: Z97.3

## 2016-04-06 HISTORY — DX: Other specified postprocedural states: Z98.890

## 2016-04-06 HISTORY — DX: Personal history of malignant melanoma of skin: Z85.820

## 2016-04-06 HISTORY — DX: Elevated prostate specific antigen (PSA): R97.20

## 2016-04-06 HISTORY — DX: Hematuria, unspecified: R31.9

## 2016-04-06 HISTORY — DX: Other intervertebral disc degeneration, lumbar region without mention of lumbar back pain or lower extremity pain: M51.369

## 2016-04-06 HISTORY — DX: Presence of external hearing-aid: Z97.4

## 2016-04-06 HISTORY — DX: Personal history of other diseases of the respiratory system: Z87.09

## 2016-04-06 HISTORY — DX: 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: D86.2

## 2016-04-06 HISTORY — DX: Sensorineural hearing loss, bilateral: H90.3

## 2016-04-06 HISTORY — DX: Other intervertebral disc degeneration, lumbar region: M51.36

## 2016-04-06 HISTORY — DX: Personal history of malignant neoplasm, unspecified: Z85.9

## 2016-04-06 HISTORY — DX: Ventricular premature depolarization: I49.3

## 2016-04-06 HISTORY — DX: Unspecified osteoarthritis, unspecified site: M19.90

## 2016-04-06 HISTORY — DX: Other specified symptoms and signs involving the circulatory and respiratory systems: R09.89

## 2016-04-06 HISTORY — DX: Other general symptoms and signs: R68.89

## 2016-04-06 LAB — POCT I-STAT 4, (NA,K, GLUC, HGB,HCT)
Glucose, Bld: 102 mg/dL — ABNORMAL HIGH (ref 65–99)
HCT: 41 % (ref 39.0–52.0)
Hemoglobin: 13.9 g/dL (ref 13.0–17.0)
POTASSIUM: 3.9 mmol/L (ref 3.5–5.1)
Sodium: 142 mmol/L (ref 135–145)

## 2016-04-06 SURGERY — TURBT (TRANSURETHRAL RESECTION OF BLADDER TUMOR)
Anesthesia: General | Site: Bladder

## 2016-04-06 MED ORDER — HYOSCYAMINE SULFATE 0.125 MG SL SUBL
0.1250 mg | SUBLINGUAL_TABLET | Freq: Two times a day (BID) | SUBLINGUAL | Status: DC | PRN
  Administered 2016-04-06: 0.125 mg via SUBLINGUAL
  Filled 2016-04-06: qty 1

## 2016-04-06 MED ORDER — CEFAZOLIN SODIUM-DEXTROSE 2-4 GM/100ML-% IV SOLN
INTRAVENOUS | Status: AC
  Filled 2016-04-06: qty 100

## 2016-04-06 MED ORDER — BACITRACIN-NEOMYCIN-POLYMYXIN 400-5-5000 EX OINT: 1.0000 "application " | TOPICAL_OINTMENT | Freq: Two times a day (BID) | CUTANEOUS | 0 refills | Status: DC

## 2016-04-06 MED ORDER — BELLADONNA ALKALOIDS-OPIUM 16.2-60 MG RE SUPP
RECTAL | Status: AC
  Filled 2016-04-06: qty 1

## 2016-04-06 MED ORDER — ACETAMINOPHEN 500 MG PO TABS
ORAL_TABLET | ORAL | Status: AC
  Filled 2016-04-06: qty 2

## 2016-04-06 MED ORDER — FENTANYL CITRATE (PF) 100 MCG/2ML IJ SOLN
INTRAMUSCULAR | Status: AC
  Filled 2016-04-06: qty 2

## 2016-04-06 MED ORDER — ACETAMINOPHEN 500 MG PO TABS
1000.0000 mg | ORAL_TABLET | ORAL | Status: AC
  Administered 2016-04-06: 1000 mg via ORAL
  Filled 2016-04-06: qty 2

## 2016-04-06 MED ORDER — LIDOCAINE 2% (20 MG/ML) 5 ML SYRINGE
INTRAMUSCULAR | Status: DC | PRN
  Administered 2016-04-06: 100 mg via INTRAVENOUS

## 2016-04-06 MED ORDER — TRAMADOL-ACETAMINOPHEN 37.5-325 MG PO TABS: 1.0000 | ORAL_TABLET | Freq: Four times a day (QID) | ORAL | 2 refills | Status: DC | PRN

## 2016-04-06 MED ORDER — FENTANYL CITRATE (PF) 100 MCG/2ML IJ SOLN
25.0000 ug | INTRAMUSCULAR | Status: DC | PRN
  Filled 2016-04-06: qty 1

## 2016-04-06 MED ORDER — DEXAMETHASONE SODIUM PHOSPHATE 10 MG/ML IJ SOLN
INTRAMUSCULAR | Status: AC
  Filled 2016-04-06: qty 1

## 2016-04-06 MED ORDER — PROPOFOL 10 MG/ML IV BOLUS
INTRAVENOUS | Status: AC
  Filled 2016-04-06: qty 20

## 2016-04-06 MED ORDER — LACTATED RINGERS IV SOLN
INTRAVENOUS | Status: DC
  Administered 2016-04-06 (×2): via INTRAVENOUS
  Filled 2016-04-06: qty 1000

## 2016-04-06 MED ORDER — FENTANYL CITRATE (PF) 100 MCG/2ML IJ SOLN
INTRAMUSCULAR | Status: DC | PRN
  Administered 2016-04-06 (×2): 50 ug via INTRAVENOUS

## 2016-04-06 MED ORDER — LIDOCAINE 2% (20 MG/ML) 5 ML SYRINGE
INTRAMUSCULAR | Status: AC
  Filled 2016-04-06: qty 5

## 2016-04-06 MED ORDER — ONDANSETRON HCL 4 MG/2ML IJ SOLN
INTRAMUSCULAR | Status: AC
  Filled 2016-04-06: qty 2

## 2016-04-06 MED ORDER — HYOSCYAMINE SULFATE SL 0.125 MG SL SUBL: 0.1250 mg | SUBLINGUAL_TABLET | Freq: Two times a day (BID) | SUBLINGUAL | 1 refills | Status: DC | PRN

## 2016-04-06 MED ORDER — ONDANSETRON HCL 4 MG/2ML IJ SOLN
INTRAMUSCULAR | Status: DC | PRN
Start: 2016-04-06 — End: 2016-04-06
  Administered 2016-04-06: 4 mg via INTRAVENOUS

## 2016-04-06 MED ORDER — STERILE WATER FOR IRRIGATION IR SOLN
Status: DC | PRN
  Administered 2016-04-06: 9000 mL

## 2016-04-06 MED ORDER — CEFAZOLIN SODIUM-DEXTROSE 2-4 GM/100ML-% IV SOLN
2.0000 g | Freq: Once | INTRAVENOUS | Status: AC
  Administered 2016-04-06: 2 g via INTRAVENOUS
  Filled 2016-04-06: qty 100

## 2016-04-06 MED ORDER — DEXAMETHASONE SODIUM PHOSPHATE 4 MG/ML IJ SOLN
INTRAMUSCULAR | Status: DC | PRN
  Administered 2016-04-06: 10 mg via INTRAVENOUS

## 2016-04-06 MED ORDER — TRIMETHOPRIM 100 MG PO TABS: 100.0000 mg | ORAL_TABLET | Freq: Every day | ORAL | 1 refills | Status: DC

## 2016-04-06 MED ORDER — PHENAZOPYRIDINE HCL 200 MG PO TABS: 200.0000 mg | ORAL_TABLET | Freq: Three times a day (TID) | ORAL | 3 refills | Status: DC | PRN

## 2016-04-06 MED ORDER — PROPOFOL 10 MG/ML IV BOLUS
INTRAVENOUS | Status: DC | PRN
  Administered 2016-04-06: 160 mg via INTRAVENOUS

## 2016-04-06 MED ORDER — SODIUM CHLORIDE 0.9 % IR SOLN
Status: DC | PRN
  Administered 2016-04-06: 12000 mL

## 2016-04-06 MED ORDER — HYOSCYAMINE SULFATE 0.125 MG SL SUBL
SUBLINGUAL_TABLET | SUBLINGUAL | Status: AC
  Filled 2016-04-06: qty 1

## 2016-04-06 MED ORDER — BELLADONNA ALKALOIDS-OPIUM 16.2-60 MG RE SUPP
RECTAL | Status: DC | PRN
  Administered 2016-04-06: 1 via RECTAL

## 2016-04-06 SURGICAL SUPPLY — 36 items
BAG DRAIN URO-CYSTO SKYTR STRL (DRAIN) ×3 IMPLANT
BAG DRN ANRFLXCHMBR STRAP LEK (BAG)
BAG DRN UROCATH (DRAIN) ×1
BAG URINE DRAINAGE (UROLOGICAL SUPPLIES) ×2 IMPLANT
BAG URINE LEG 19OZ MD ST LTX (BAG) IMPLANT
BOOTIES KNEE HIGH SLOAN (MISCELLANEOUS) ×3 IMPLANT
CATH FOLEY 2WAY SLVR  5CC 20FR (CATHETERS)
CATH FOLEY 2WAY SLVR  5CC 22FR (CATHETERS)
CATH FOLEY 2WAY SLVR 5CC 20FR (CATHETERS) IMPLANT
CATH FOLEY 2WAY SLVR 5CC 22FR (CATHETERS) IMPLANT
CATH HEMA 3WAY 30CC 22FR COUDE (CATHETERS) ×2 IMPLANT
CLOTH BEACON ORANGE TIMEOUT ST (SAFETY) ×3 IMPLANT
ELECT REM PT RETURN 9FT ADLT (ELECTROSURGICAL) ×3
ELECTRODE REM PT RTRN 9FT ADLT (ELECTROSURGICAL) ×1 IMPLANT
GLOVE BIO SURGEON STRL SZ 6.5 (GLOVE) ×1 IMPLANT
GLOVE BIO SURGEON STRL SZ7.5 (GLOVE) ×3 IMPLANT
GLOVE BIO SURGEONS STRL SZ 6.5 (GLOVE) ×1
GLOVE BIOGEL PI IND STRL 6.5 (GLOVE) IMPLANT
GLOVE BIOGEL PI INDICATOR 6.5 (GLOVE) ×4
GOWN STRL REUS W/ TWL XL LVL3 (GOWN DISPOSABLE) ×1 IMPLANT
GOWN STRL REUS W/TWL LRG LVL3 (GOWN DISPOSABLE) ×4 IMPLANT
GOWN STRL REUS W/TWL XL LVL3 (GOWN DISPOSABLE)
GOWN XL W/COTTON TOWEL STD (GOWNS) ×1 IMPLANT
HOLDER FOLEY CATH W/STRAP (MISCELLANEOUS) ×2 IMPLANT
IV NS IRRIG 3000ML ARTHROMATIC (IV SOLUTION) ×8 IMPLANT
KIT ROOM TURNOVER WOR (KITS) ×3 IMPLANT
MANIFOLD NEPTUNE II (INSTRUMENTS) IMPLANT
PACK CYSTO (CUSTOM PROCEDURE TRAY) ×3 IMPLANT
PLUG CATH AND CAP STER (CATHETERS) IMPLANT
SET ASPIRATION TUBING (TUBING) IMPLANT
SYR 30ML LL (SYRINGE) ×2 IMPLANT
SYRINGE IRR TOOMEY STRL 70CC (SYRINGE) IMPLANT
TUBE CONNECTING 12'X1/4 (SUCTIONS)
TUBE CONNECTING 12X1/4 (SUCTIONS) IMPLANT
WATER STERILE IRR 3000ML UROMA (IV SOLUTION) ×6 IMPLANT
WATER STERILE IRR 500ML POUR (IV SOLUTION) ×2 IMPLANT

## 2016-04-06 NOTE — Op Note (Signed)
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   Postoperative diagnosis: Same, plus significant                                           BPH with elevated bladder neck and bladder outlet signs of severe trabeculation and cellule formation.  Operation:  1.  Tauber excisional bx of 2 bladder tumors, 2 cm each, from the right and left  lateral wall;                      2.   Staged  TUR-BT of Left bladder wall diverticular tumor 10cm.   Surgeon:  Adrian Cruel. [name], MD  First assistant: None  Anesthesia:  General LMA  Preparation:  After appropriate preanesthesia, the patient was brought to the operating room, placed on the operating table in the dorsal supine position where general LMA anesthesia was induced. He was then replaced in dorsal lithotomy position with pubis was prepped with Betadine solution and draped in usual fashion. The history was double checked. The armband was double checked.  Review history:  Adrian Rios is a 76 year-old male established patient who is here for blood in the urine.  He did see the blood in his urine. He first noticed the symptoms approximately 02/27/2016. He has not seen blood clots.   He does not have a burning sensation when he urinates. He is not currently having trouble urinating.   He is not having pain. He has not recently had unwanted weight loss.   His last U/S or CT Scan was 03/05/2016.   Seen at an Urgent Care over weekend of New Year's and told he did not have UTI. C&S was repeated on 03/03/16 which was negative.  CT scan 01.04/18 shows L sided bladder tic with bladder tumor.     CC: I have an enlarged prostate (follow-up).  HPI: He is currently on flomax, rapaflo, and finasteride for the symptoms due to the enlarged prostate gland. He is not on new medications for symptoms of prostate enlargement.   He does not have an abnormal sensation  when needing to urinate. He is not having problems getting his urine stream started. He does have a good size and strength to his urinary stream. He is not having problems with emptying his bladder well. He does not dribble at the end of urination.   He is s/p a Pbx in 2007, with a hx of HgPIN. Last PSA on 03/03/16 was 0.56 (true PSA was 1.12 due to taking Finasteride). Treated with tamsulosin and finasteride.      AUA Symptom Score: Less than 50% of the time he has the sensation of not emptying his bladder completely when finished urinating. He never has to urinate again less that two hours after he has finished urinating. He does not have to stop and start again several times when he urinates. Less than 20% of the time he finds it difficult to postpone urination. Less than 20% of the time he has a weak urinary stream. Less than 20% of the time he has to push or strain to begin urination. He has to get up to urinate 1 time from the time he goes to bed until the time he gets up in the morning.   Calculated AUA Symptom Score: 6  QOL Score: He would feel mostly satisfied if he had to live with his urinary condition the way it is now for the rest of his life.   Calculated QOL Symptom Score: 2    ALLERGIES: Crab - Skin Rash, Itching Iodine - Trouble Breathing, Itching, Redness     Statement of  Likelihood of Success: Excellent. TIME-OUT observed.:  Procedure: Cystourethroscopy was accomplished, and photo documentation was accomplished. The patient was noted to have BPH, with short prostatic fossa, and elevated bladder neck. Within the bladder, the trigone was identified, and clear reflux was noted from both ureteral orifices. There was significant trabeculation noted, as well as cellule formation. There was a large left bladder wall diverticulum. There were multiple bladder tumors identified, and photo documented. Bladder tumors were noted on the right lateral wall the left lateral wall,  and at the bladder dome. The left bladder diverticulum, as previous noted, was wide mouth. Within the diverticulum, a large 10 cm bladder tumor was identified. This tumor was within the diverticulum, and was growing on the mouth of the diverticulum as well, from the 3:00 through the 9:00 position.  Using the [place] forceps, excisional biopsies of 2 separate 2 cm tumors were obtained from the right and left lateral walls. No bleeding was noted from excisional biopsy these tumors. [name] was then changed from water to saline, and saline transurethral resection of the tense ammeter tumor in and around the left bladder wall diverticulum was undertaken. Tumor was resected from the 3:00 to 9:00 position around the base of the diverticulum. Tumor was attempted to be resected within the diverticulum, understanding the thin back wall of the diverticulum. It is noted that through electrical transmission stimulated the left pudendal nerve, and I then elected to stop further resection of this greater than 10 cm tumor mass. Cauterization was accomplished, and I left a 22 [place] hematuria catheter, with 30 mL balloon within the ladder.      It is noted that the patient has a staged procedure, and will need to return for further removal of smaller bladder cancers, and further resection of the diverticular tumor if possible. He would also need to have thulium laser prostatectomy. He will be evaluated for possible cystectomy.  The patient was awakened and taken to recovery room in good condition.

## 2016-04-06 NOTE — Discharge Instructions (Addendum)
Bladder Cancer Bladder cancer is an abnormal growth of tissue in your bladder. Your bladder is the balloon-like sac in your pelvis. It collects and stores urine that comes from the kidneys through the ureters. The bladder wall is made of layers. If cancer spreads into these layers and through the wall of the bladder, it becomes more difficult to treat.  There are four stages of bladder cancer:  Stage I. Cancer at this stage occurs in the bladder's inner lining but has not invaded the muscular bladder wall.  Stage II. At this stage, cancer has invaded the bladder wall but is still confined to the bladder.  Stage III. By this stage, the cancer cells have spread through the bladder wall to surrounding tissue. They may also have spread to the prostate in men or the uterus or vagina in women.  Stage IV. By this stage, cancer cells may have spread to the lymph nodes and other organs, such as your lungs, bones, or liver. RISK FACTORS Although the cause of bladder cancer is not known, the following risk factors can increase your chances of getting bladder cancer:   Smoking.   Occupational exposures, such as rubber, leather, textile, dyes, chemicals, and paint.  Being white.  Age.   Being male.   Having chronic bladder inflammation.   Having a bladder cancer history.   Having a family history of bladder cancer (heredity).   Having had chemotherapy or radiation therapy to the pelvis.   Being exposed to arsenic.  SYMPTOMS   Blood in the urine.   Pain with urination.   Frequent bladder or urine infections.  Increase in urgency and frequency of urination. DIAGNOSIS  Your health care provider may suspect bladder cancer based on your description of urinary symptoms or based on the finding of blood or infection in the urine (especially if this has recurred several times). Other tests or procedures that may be performed include:   A narrow tube being inserted into your bladder  through your urethra (cystoscopy) in order to view the lining of your bladder for tumors.   A biopsy to sample the tumor to see if cancer is present.  If cancer is present, it will then be staged to determine its severity and extent. It is important to know how deeply into the bladder wall the cancer has grown and whether the cancer has spread to any other parts of your body. Staging may require blood tests or special scans such as a CT scan, MRI, bone scan, or chest X-ray.  TREATMENT  Once your cancer has been diagnosed and staged, you should discuss a treatment plan with your health care provider. Based on the stage of the cancer, one treatment or a combination of treatments may be recommended. The most common forms of treatment are:   Surgery. Procedures that may be done include transurethral resection and cystectomy.  Radiation therapy. This is infrequently used to treat bladder cancer.   Chemotherapy. During this treatment, drugs are used to kill cancer cells.  Immunotherapy. This is usually administered directly into the bladder. HOME CARE INSTRUCTIONS  Take medicines only as directed by your health care provider.   Maintain a healthy diet.   Consider joining a support group. This may help you learn to cope with the stress of having bladder cancer.   Seek advice to help you manage treatment side effects.   Keep all follow-up visits as directed by your health care provider.   Inform your cancer specialist if you are  admitted to the hospital.  [place] IF:  There is blood in your urine.  You have symptoms of a urinary tract infection. These include:  Tiredness.  Shakiness.  Weakness.  Muscle aches.  Abdominal pain.  Frequent and intense urge to urinate (in young women).  Burning feeling in the bladder or urethra during urination (in young women). SEEK IMMEDIATE MEDICAL CARE IF:  You are unable to urinate. This information is not intended to  replace advice given to you by your health care provider. Make sure you discuss any questions you have with your health care provider. Document Released: 02/19/2003 Document Revised: 03/09/2014 Document Reviewed: 08/09/2012 Elsevier Interactive Patient Education  2017 [place] Anest[facility] Home Care Instructions  Activity: Get plenty of rest for the remainder of the day. A responsible adult should stay with you for 24 hours following the procedure.  For the next 24 hours, DO NOT: -Drive a car -Paediatric nurse -Drink alcoholic beverages -Take any medication unless instructed by your physician -Make any legal decisions or sign important papers.  Meals: Start with liquid foods such as gelatin or soup. Progress to regular foods as tolerated. Avoid greasy, spicy, heavy foods. If nausea and/or vomiting occur, drink only clear liquids until the nausea and/or vomiting subsides. Call your physician if vomiting continues.  Special Instructions/Symptoms: Your throat may feel dry or sore from the anesthesia or the breathing tube placed in your throat during surgery. If this causes discomfort, gargle with warm salt water. The discomfort should disappear within 24 hours.  If you had a scopolamine patch placed behind your ear for the management of post- operative nausea and/or vomiting:  1. The medication in the patch is effective for 72 hours, after which it should be removed.  Wrap patch in a tissue and discard in the trash. Wash hands thoroughly with soap and water. 2. You may remove the patch earlier than 72 hours if you experience unpleasant side effects which may include dry mouth, dizziness or visual disturbances. 3. Avoid touching the patch. Wash your hands with soap and water after contact with the patch.    Foley Catheter Care, Adult A Foley catheter is a soft, flexible tube. This tube is placed into your bladder to drain pee (urine). If you go home with this catheter in place,  follow the instructions below. TAKING CARE OF THE CATHETER 1. Wash your hands with soap and water. 2. Put soap and water on a clean washcloth.  Clean the skin where the tube goes into your body.  Clean away from the tube site.  Never wipe toward the tube.  Clean the area using a circular motion.  Remove all the soap. Pat the area dry with a clean towel. For males, reposition the skin that covers the end of the penis (foreskin). 3. Attach the tube to your leg with tape or a leg strap. Do not stretch the tube tight. If you are using tape, remove any stickiness left behind by past tape you used. 4. Keep the drainage bag below your hips. Keep it off the floor. 5. Check your tube during the day. Make sure it is working and draining. Make sure the tube does not curl, twist, or bend. 6. Do not pull on the tube or try to take it out. TAKING CARE OF THE DRAINAGE BAGS You will have a large overnight drainage bag and a small leg bag. You may wear the overnight bag any time. Never wear the small bag  at night. Follow the directions below. Emptying the Drainage Bag  Empty your drainage bag when it is ?- full or at least 2-3 times a day. 1. Wash your hands with soap and water. 2. Keep the drainage bag below your hips. 3. Hold the dirty bag over the toilet or clean container. 4. Open the pour spout at the bottom of the bag. Empty the pee into the toilet or container. Do not let the pour spout touch anything. 5. Clean the pour spout with a gauze pad or cotton ball that has rubbing alcohol on it. 6. Close the pour spout. 7. Attach the bag to your leg with tape or a leg strap. 8. Wash your hands well. Changing the Drainage Bag  Change your bag once a month or sooner if it starts to smell or look dirty.  1. Wash your hands with soap and water. 2. Pinch the rubber tube so that pee does not spill out. 3. Disconnect the catheter tube from the drainage tube at the connection valve. Do not let the tubes  touch anything. 4. Clean the end of the catheter tube with an alcohol wipe. Clean the end of a the drainage tube with a different alcohol wipe. 5. Connect the catheter tube to the drainage tube of the clean drainage bag. 6. Attach the new bag to the leg with tape or a leg strap. Avoid attaching the new bag too tightly. 7. Wash your hands well. Cleaning the Drainage Bag  1. Wash your hands with soap and water. 2. Wash the bag in warm, soapy water. 3. Rinse the bag with warm water. 4. Fill the bag with a mixture of white vinegar and water (1 cup vinegar to 1 quart warm water [.2 liter vinegar to 1 liter warm water]). Close the bag and soak it for 30 minutes in the solution. 5. Rinse the bag with warm water. 6. Hang the bag to dry with the pour spout open and hanging downward. 7. Store the clean bag (once it is dry) in a clean plastic bag. 8. Wash your hands well. PREVENT INFECTION  Wash your hands before and after touching your tube.  Take showers every day. Wash the skin where the tube enters your body. Do not take baths. Replace wet leg straps with dry ones, if this applies.  Do not use powders, sprays, or lotions on the genital area. Only use creams, lotions, or ointments as told by your doctor.  For females, wipe from front to back after going to the bathroom.  Drink enough fluids to keep your pee clear or pale yellow unless you are told not to have too much fluid (fluid restriction).  Do not let the drainage bag or tubing touch or lie on the floor.  Wear cotton underwear to keep the area dry. GET HELP IF:  Your pee is cloudy or smells unusually bad.  Your tube becomes clogged.  You are not draining pee into the bag or your bladder feels full.  Your tube starts to leak. GET HELP RIGHT AWAY IF:  You have pain, puffiness (swelling), redness, or yellowish-white fluid (pus) where the tube enters the body.  You have pain in the belly (abdomen), legs, lower back, or  bladder.  You have a fever.  You see blood fill the tube, or your pee is pink or red.  You feel sick to your stomach (nauseous), throw up (vomit), or have chills.  Your tube gets pulled out. MAKE SURE YOU:   Understand  these instructions.  Will watch your condition.  Will get help right away if you are not doing well or get worse. This information is not intended to replace advice given to you by your health care provider. Make sure you discuss any questions you have with your health care provider. Document Released: 06/13/2012 Document Revised: 03/09/2014 Document Reviewed: 02/02/2015 Elsevier Interactive Patient Education  2017 [employer].

## 2016-04-06 NOTE — Anesthesia Postprocedure Evaluation (Signed)
Anesthesia Post Note  Patient: Adrian Rios  Procedure(s) Performed: Procedure(s) (LRB): CYSTOSCOPY TRANSURETHRAL RESECTION OF BLADDER TUMORS (TURBT) (N/A)  Patient location during evaluation: PACU Anesthesia Type: General Level of consciousness: awake and alert and patient cooperative Pain management: pain level controlled Vital Signs Assessment: post-procedure vital signs reviewed and stable Respiratory status: spontaneous breathing and respiratory function stable Cardiovascular status: stable Anesthetic complications: no       Last Vitals:  Vitals:   04/06/16 1200 04/06/16 1215  BP: 127/78 113/89  Pulse: 75 71  Resp: 18 (!) 26  Temp:      Last Pain:  Vitals:   04/06/16 1145  TempSrc:   PainSc: [place]

## 2016-04-06 NOTE — Interval H&P Note (Signed)
History and Physical Interval Note:  04/06/2016 9:44 AM  Adrian Rios  has presented today for surgery, with the diagnosis of bladder tumors  The various methods of treatment have been discussed with the patient and family. After consideration of risks, benefits and other options for treatment, the patient has consented to  Procedure(s): CYSTOSCOPY TRANSURETHRAL RESECTION OF BLADDER TUMOR (TURBT) (N/A) as a surgical intervention .  The patient's history has been reviewed, patient examined, no change in status, stable for surgery.  I have reviewed the patient's chart and labs.  Questions were answered to the patient's satisfaction.     Adrian Rios I Adrian Rios Pt with multiple bladder tumors. Will plan to photograph tumors, but will not use Mitomycin, b/c of proximity to his diverticulum. I will photograph for patient. He may need catheter post-op for 2-3 days.

## 2016-04-06 NOTE — Transfer of Care (Signed)
Immediate Anesthesia Transfer of Care Note  Patient: HUXON LEVIER  Procedure(s) Performed: Procedure(s): CYSTOSCOPY TRANSURETHRAL RESECTION OF BLADDER TUMORS (TURBT) (N/A)  Patient Location: PACU  Anesthesia Type:General  Level of Consciousness: oriented  Airway & Oxygen Therapy: Patient Spontanous Breathing and Patient connected to nasal cannula oxygen  Post-op Assessment: Report given to RN  Post vital signs: Reviewed and stable  Last Vitals:  Vitals:   04/06/16 0854 04/06/16 1117  BP: 126/75 131/82  Pulse: 77 85  Resp: 16 19  Temp: 36.6 C 36.9 C    Last Pain:  Vitals:   04/06/16 0854  TempSrc: Oral         Complications: No apparent anesthesia complications

## 2016-04-06 NOTE — Anesthesia Preprocedure Evaluation (Addendum)
Anesthesia Evaluation  Patient identified by MRN, date of birth, ID band Patient awake    Reviewed: Allergy & Precautions, NPO status , Patient's Chart, lab work & pertinent test results  Airway Mallampati: II  TM Distance: >3 FB Neck ROM: Full    Dental  (+) Dental Advisory Given   Pulmonary asthma ,  +Sarcoid   breath sounds clear to auscultation       Cardiovascular negative cardio ROS   Rhythm:Regular Rate:Normal     Neuro/Psych Depression negative neurological ROS     GI/Hepatic Neg liver ROS, GERD  Medicated,  Endo/Other  negative endocrine ROS  Renal/GU negative Renal ROS     Musculoskeletal  (+) Arthritis ,   Abdominal   Peds  Hematology negative hematology ROS (+)   Anesthesia Other Findings   Reproductive/Obstetrics                            Lab Results  Component Value Date   WBC 8.1 02/18/2015   HGB 13.9 02/18/2015   HCT 42.0 02/18/2015   MCV 91.1 02/18/2015   PLT 335.0 02/18/2015   Lab Results  Component Value Date   CREATININE 0.90 02/17/2016   BUN 19 02/17/2016   NA 140 02/17/2016   K 4.2 02/17/2016   CL 106 02/17/2016   CO2 26 02/17/2016    Anesthesia Physical Anesthesia Plan  ASA: II  Anesthesia Plan: General   Post-op Pain Management:    Induction: Intravenous  Airway Management Planned: LMA  Additional Equipment:   Intra-op Plan:   Post-operative Plan: Extubation in OR  Informed Consent: I have reviewed the patients History and Physical, chart, labs and discussed the procedure including the risks, benefits and alternatives for the proposed anesthesia with the patient or authorized representative who has indicated his/her understanding and acceptance.   Dental advisory given  Plan Discussed with: CRNA  Anesthesia Plan Comments:         Anesthesia Quick Evaluation

## 2016-04-06 NOTE — Anesthesia Procedure Notes (Addendum)
Procedure Name: LMA Insertion Date/Time: 04/06/2016 9:55 AM Performed by: Bethena Roys T Pre-anesthesia Checklist: Patient identified, Emergency Drugs available, Suction available and Patient being monitored Patient Re-evaluated:Patient Re-evaluated prior to inductionOxygen Delivery Method: Circle system utilized Preoxygenation: Pre-oxygenation with 100% oxygen Intubation Type: IV induction Ventilation: Mask ventilation without difficulty LMA: LMA inserted LMA Size: 5.0 Number of attempts: 1 Airway Equipment and Method: Bite block Placement Confirmation: positive ETCO2 Tube secured with: Tape Dental Injury: Teeth and Oropharynx as per pre-operative assessment

## 2016-04-07 ENCOUNTER — Encounter (HOSPITAL_BASED_OUTPATIENT_CLINIC_OR_DEPARTMENT_OTHER): Payer: Self-pay | Admitting: Emergency Medicine

## 2016-04-07 ENCOUNTER — Emergency Department (HOSPITAL_COMMUNITY)
Admission: EM | Admit: 2016-04-07 | Discharge: 2016-04-07 | Disposition: A | Discharge status: Home / Self Care | Payer: Medicare Other | Source: Home / Self Care

## 2016-04-07 ENCOUNTER — Encounter (HOSPITAL_BASED_OUTPATIENT_CLINIC_OR_DEPARTMENT_OTHER): Payer: Self-pay | Admitting: Urology

## 2016-04-07 ENCOUNTER — Emergency Department (HOSPITAL_BASED_OUTPATIENT_CLINIC_OR_DEPARTMENT_OTHER)
Admission: EM | Admit: 2016-04-07 | Discharge: 2016-04-07 | Disposition: A | Discharge status: Home / Self Care | Payer: Medicare Other | Attending: Emergency Medicine | Admitting: Emergency Medicine

## 2016-04-07 ENCOUNTER — Emergency Department (HOSPITAL_BASED_OUTPATIENT_CLINIC_OR_DEPARTMENT_OTHER): Payer: Medicare Other

## 2016-04-07 DIAGNOSIS — M79604 Pain in right leg: Secondary | ICD-10-CM | POA: Diagnosis present

## 2016-04-07 DIAGNOSIS — M79661 Pain in right lower leg: Secondary | ICD-10-CM

## 2016-04-07 DIAGNOSIS — Z5321 Procedure and treatment not carried out due to patient leaving prior to being seen by health care provider: Secondary | ICD-10-CM

## 2016-04-07 DIAGNOSIS — I83811 Varicose veins of right lower extremities with pain: Secondary | ICD-10-CM | POA: Diagnosis not present

## 2016-04-07 DIAGNOSIS — R600 Localized edema: Secondary | ICD-10-CM | POA: Diagnosis not present

## 2016-04-07 DIAGNOSIS — I83891 Varicose veins of right lower extremities with other complications: Secondary | ICD-10-CM

## 2016-04-07 MED ORDER — HYDROCODONE-ACETAMINOPHEN 5-325 MG PO TABS: 1.0000 | ORAL_TABLET | Freq: Four times a day (QID) | ORAL | 0 refills | Status: DC | PRN

## 2016-04-07 MED ORDER — HYDROCODONE-ACETAMINOPHEN 5-325 MG PO TABS
2.0000 | ORAL_TABLET | Freq: Once | ORAL | Status: AC
  Administered 2016-04-07: 2 via ORAL
  Filled 2016-04-07: qty 2

## 2016-04-07 NOTE — Discharge Instructions (Signed)
Take vicodin for severe pain.   Elevate your right leg.   See your doctor  Return to ER if you have worse leg pain, chest pain, foley not draining.

## 2016-04-07 NOTE — ED Triage Notes (Addendum)
Patient advised that had Trans resection of bladder tumor done yesterday and woke up this morning with lower right leg pain.  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  States that the pain began this morning at 730 and has not been relieved with pain medications.  Patient was prescribed tramadol 75-325mg  yesterday for his procedure.  Patient denies chest pain, denies SOB.

## 2016-04-07 NOTE — ED Provider Notes (Signed)
[facility] DEPT [facility] Provider Note   CSN: [place]:[id number] Arrival date & time: 04/07/16  [id number]  By signing my name below, I, Jeanell Sparrow, attest that this documentation has been prepared under the direction and in the presence of Drenda Freeze, MD. Electronically Signed: Jeanell Sparrow, Scribe. 04/07/2016. 6:52 PM.  History   Chief Complaint Chief Complaint  Patient presents with  . Leg Pain   The history is provided by the patient and a relative. No language interpreter was used.    HPI Comments: Adrian Rios is a 76 y.o. male who presents to the Emergency Department complaining of constant moderate RLE pain that started this morning. He states he had a TURP done yesterday with no complication. Site is currently draining normally. He is concerned about a DVT. His pain is in the anterior shin. His pain is relieved by tramadol and exacerbated by ambulation. He reports associated mild RLE swelling. He denies any other complaints.    Surgeon: Dr. [name]  PCP: [name], MD  Past Medical History:  Diagnosis Date  . Allergic rhinitis   . Arthritis   . Benign localized prostatic hyperplasia with lower urinary tract symptoms (LUTS)   . Bladder tumor   . Borderline glaucoma of right eye   . Carotid bruit    per duplex 03-11-2016 RICA 1-39%  . Chronic throat clearing   . DDD (degenerative disc disease), lumbar   . Depression   . ED (erectile dysfunction)   . Elevated PSA    urologist-  dr [name]--- s/p  prostate bx's  . GERD (gastroesophageal reflux disease)   . Hematuria   . History of chronic bronchitis   . 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 History of squamous cell carcinoma excision   . History of squamous cell carcinoma excision    several times  . Hyperlipidemia   . Pre-diabetes   . Premature ventricular contractions (PVCs) (VPCs)   . RAD (reactive airway disease)   . 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 Sensorineural hearing loss (SNHL) of both ears   . Wears glasses   . Wears hearing aid    bilateral    Patient Active Problem List   Diagnosis Date Noted  . Carotid bruit 02/17/2016  . Prediabetes 08/20/2015  . Overweight (BMI 25.0-29.9) 08/20/2015  . Esophageal reflux 08/20/2015  . Glaucoma 02/14/2015  . Varicose veins of lower extremities with other complications [id number]  . RAD (reactive airway disease) 10/27/2011  . 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 Anemia, unspecified 10/27/2011  . 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 ERECTILE DYSFUNCTION 04/23/2010  . [place] DISEASE, LUMBOSACRAL SPINE 02/06/2010  . 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 Depression 04/12/2008  . Allergic rhinitis 04/12/2008  . ELEVATED PROSTATE SPECIFIC ANTIGEN 04/12/2008  . Hyperlipidemia 10/05/2006  . Sarcoidosis ([place]) 07/05/2006    Past Surgical History:  Procedure Laterality Date  . APPENDECTOMY  2008  . CARDIOVASCULAR STRESS TEST  05/27/2010   normal nuclear study w/ no ischemia/  normal LV function and wall motion , ef 60%  . DEEP NECK LYMPH NODE BIOPSY / EXCISION  1970's   and Bronchoscopy w/ bx's ( dx Sarcoidosis)  . MOHS SURGERY  2013 approx.    nasal; pre melanoma  . PARS PLANA VITRECTOMY Right 2007   repair macular pucker  .  TONSILLECTOMY AND ADENOIDECTOMY  child  . TRANSURETHRAL RESECTION OF BLADDER TUMOR N/A 04/06/2016   Procedure: CYSTOSCOPY TRANSURETHRAL RESECTION OF BLADDER TUMORS (TURBT);  Surgeon: Carolan Clines, MD;  Location: [facility];  Service: Urology;  Laterality: N/A;  . TRANSURETHRAL RESECTION OF PROSTATE         Home Medications    Prior to Admission medications   Medication Sig Start Date End Date Taking? Authorizing Provider  albuterol (PROVENTIL HFA;VENTOLIN HFA) 108 (90 Base) MCG/ACT inhaler Inhale 2 puffs into the lungs every 6 (six) hours as  needed for wheezing or shortness of breath. 08/27/15   [name], MD  aspirin EC 81 MG tablet Take 1 tablet (81 mg total) by mouth daily. 02/17/16   [name], MD  finasteride (PROSCAR) 5 MG tablet Take 5 mg by mouth every morning.     Historical Provider, MD  Fluticasone-Salmeterol (ADVAIR DISKUS) 250-50 MCG/DOSE AEPB INHALE ONE DOSE BY MOUTH EVERY 12 HOURS Patient taking differently: Inhale 1 puff into the lungs at bedtime. INHALE ONE DOSE BY MOUTH EVERY 12 HOURS 06/18/15   [name], MD  Hyoscyamine Sulfate SL (LEVSIN/SL) 0.125 MG SUBL Place 0.125 mg under the tongue 2 (two) times daily between meals as needed. 04/06/16   Carolan Clines, MD  levalbuterol [facility] HFA) 45 MCG/ACT inhaler Inhale 2 puffs into the lungs every 6 (six) hours as needed for wheezing. 08/20/15   [name], MD  montelukast (SINGULAIR) 10 MG tablet Take 1 tablet (10 mg total) by mouth daily. Patient taking differently: Take 10 mg by mouth at bedtime.  06/18/15   [name], MD  neomycin-bacitracin-polymyxin (NEOSPORIN) ointment Apply 1 application topically 2 (two) times daily. Apply to penile meatus 2x/day 04/06/16   Carolan Clines, MD  phenazopyridine (PYRIDIUM) 200 MG tablet Take 1 tablet (200 mg total) by mouth 3 (three) times daily as needed for pain. 04/06/16   Carolan Clines, MD  ranitidine (ZANTAC) 150 MG tablet Take 1 tablet (150 mg total) by mouth at bedtime. 08/20/15   [name], MD  tamsulosin (FLOMAX) 0.4 MG CAPS capsule Take 0.4 mg by mouth 2 (two) times daily.     Historical Provider, MD  Tiotropium Bromide Monohydrate (SPIRIVA RESPIMAT) 1.25 MCG/ACT AERS Inhale 2 puffs into the lungs daily. Patient taking differently: Inhale 2 puffs into the lungs daily as needed.  08/20/15   [name], MD  traMADol-acetaminophen (ULTRACET) 37.5-325 MG tablet Take 1 tablet by mouth every 6 (six) hours as needed. 04/06/16   Carolan Clines, MD  trimethoprim (TRIMPEX) 100 MG tablet Take 1 tablet (100  mg total) by mouth daily. 04/06/16   Carolan Clines, MD  venlafaxine (EFFEXOR) 75 MG tablet TAKE ONE TABLET BY MOUTH ONCE DAILY Patient taking differently: TAKE ONE TABLET BY MOUTH ONCE DAILY---  takes  in am 12/17/15   [name], MD    Family History Family History  Problem Relation Age of Onset  . Stroke Mother     in her 93s  . Breast cancer Sister   . Heart disease Maternal Grandmother 84    MI   . Breast cancer Paternal Grandmother   . Heart disease Paternal Grandfather 36    MI  . Stroke Brother   . Diabetes Neg Hx     Social History Social History  Substance Use Topics  . Smoking status: Never Smoker  . Smokeless tobacco: Never Used  . Alcohol use No     Allergies  Ivp dye [iodinated diagnostic agents]; Shellfish allergy; Gabapentin; Niacin-lovastatin er; and Adhesive [tape]   Review of Systems Review of Systems  Cardiovascular: Positive for leg swelling (Mild, RLE).  Musculoskeletal: Positive for myalgias (RLE).     Physical Exam Updated Vital Signs BP 129/76 (BP Location: Left Arm)   Pulse 68   Temp 98 F (36.7 C) (Oral)   Resp 26   Ht 5\' 7"  (1.702 m)   Wt 170 lb (77.1 kg)   SpO2 94%   BMI 26.63 kg/m   Physical Exam  Constitutional: He appears well-developed and well-nourished. No distress.  HENT:  Head: Normocephalic and atraumatic.  Eyes: Conjunctivae are normal.  Neck: Neck supple.  Cardiovascular: Normal rate.   Pulmonary/Chest: Effort normal.  Abdominal: Soft.  Musculoskeletal: Normal range of motion.  Right anterior shit has varicose veins with TTP. No obvious pedal edema. No obvious calf TTP.   Neurological: He is alert.  Skin: Skin is warm and dry.  Psychiatric: He has a normal mood and affect.  Nursing note and vitals reviewed.    ED Treatments / Results  DIAGNOSTIC STUDIES: Oxygen Saturation is 94% on RA, normal by my interpretation.    COORDINATION OF CARE: 6:57 PM- Pt advised of plan for treatment and pt  agrees.  Labs (all labs ordered are listed, but only abnormal results are displayed) Labs Reviewed - No data to display  EKG  EKG Interpretation None       Radiology No results found.  Procedures Procedures (including critical care time)  Medications Ordered in ED Medications  HYDROcodone-acetaminophen (NORCO/VICODIN) 5-325 MG per tablet 2 tablet (not administered)     Initial Impression / Assessment and Plan / ED Course  I have reviewed the triage vital signs and the nursing notes.  Pertinent labs & imaging results that were available during my care of the patient were reviewed by me and considered in my medical decision making (see chart for details).     Adrian Rios is a 76 y.o. male here with R leg pain. Has varicose veins that are painful. Did have TURP yesterday and foley draining serosanguinous urine with no obvious clots. I think likely pain from varicose veins. DVT study neg. Given vicodin and felt better. Told him to elevate legs, take vicodin prn pain.    Final Clinical Impressions(s) / ED Diagnoses   Final diagnoses:  None    New Prescriptions New Prescriptions   No medications on file   I personally performed the services described in this documentation, which was scribed in my presence. The recorded information has been reviewed and is accurate.    Drenda Freeze, MD 04/07/16 2105

## 2016-04-07 NOTE — ED Notes (Signed)
ED Provider at bedside. 

## 2016-04-07 NOTE — ED Triage Notes (Signed)
Severe pain along right shin that started this morning.  Started when he stood up. Had TURP yesterday at [facility].

## 2016-04-10 DIAGNOSIS — C672 Malignant neoplasm of lateral wall of bladder: Secondary | ICD-10-CM | POA: Diagnosis not present

## 2016-04-13 ENCOUNTER — Encounter: Payer: Self-pay | Admitting: *Deleted

## 2016-04-14 ENCOUNTER — Other Ambulatory Visit: Payer: Self-pay | Admitting: Internal Medicine

## 2016-04-14 DIAGNOSIS — R310 Gross hematuria: Secondary | ICD-10-CM | POA: Diagnosis not present

## 2016-04-14 DIAGNOSIS — N281 Cyst of kidney, acquired: Secondary | ICD-10-CM | POA: Diagnosis not present

## 2016-04-14 DIAGNOSIS — N401 Enlarged prostate with lower urinary tract symptoms: Secondary | ICD-10-CM | POA: Diagnosis not present

## 2016-04-14 DIAGNOSIS — C672 Malignant neoplasm of lateral wall of bladder: Secondary | ICD-10-CM | POA: Diagnosis not present

## 2016-04-14 DIAGNOSIS — R351 Nocturia: Secondary | ICD-10-CM | POA: Diagnosis not present

## 2016-04-16 ENCOUNTER — Encounter: Payer: Self-pay | Admitting: Oncology

## 2016-04-16 ENCOUNTER — Telehealth: Payer: Self-pay | Admitting: Oncology

## 2016-04-16 ENCOUNTER — Other Ambulatory Visit: Payer: Self-pay | Admitting: Urology

## 2016-04-16 NOTE — Telephone Encounter (Signed)
Cld Bethena Roys at [facility] to schedule an appt for the pt. Appt has been scheduld for the pt to see Dr. Alen Blew on 2/27 at 11am. She will call the pt. Letter mailed to the pt with the appt date and time.

## 2016-04-20 ENCOUNTER — Other Ambulatory Visit: Payer: Self-pay | Admitting: Emergency Medicine

## 2016-04-20 DIAGNOSIS — C679 Malignant neoplasm of bladder, unspecified: Secondary | ICD-10-CM | POA: Diagnosis not present

## 2016-04-20 MED ORDER — FLUTICASONE-SALMETEROL 250-50 MCG/DOSE IN AEPB: INHALATION_SPRAY | RESPIRATORY_TRACT | 1 refills | Status: DC

## 2016-04-27 ENCOUNTER — Telehealth: Payer: Self-pay | Admitting: *Deleted

## 2016-04-27 NOTE — Telephone Encounter (Signed)
This RN called and left a message reminding him of his appointment with Dr. Alen Blew tomorrow, 04/28/16 at 10:30 am.

## 2016-04-28 ENCOUNTER — Ambulatory Visit (HOSPITAL_BASED_OUTPATIENT_CLINIC_OR_DEPARTMENT_OTHER): Payer: Medicare Other | Admitting: Oncology

## 2016-04-28 ENCOUNTER — Telehealth: Payer: Self-pay | Admitting: Oncology

## 2016-04-28 VITALS — BP 110/68 | HR 80 | Temp 98.2°F | Resp 18 | Ht 67.0 in | Wt 173.7 lb

## 2016-04-28 DIAGNOSIS — C679 Malignant neoplasm of bladder, unspecified: Secondary | ICD-10-CM | POA: Diagnosis not present

## 2016-04-28 DIAGNOSIS — Z803 Family history of malignant neoplasm of breast: Secondary | ICD-10-CM | POA: Diagnosis not present

## 2016-04-28 NOTE — Telephone Encounter (Signed)
Appointments scheduled per 2/27 LOS. Patient given AVS report and calendars with future scheduled appointments. °

## 2016-04-28 NOTE — Progress Notes (Signed)
Reason for Referral: Bladder tumors.   HPI: 76 year old gentleman currently of [place] where he lived for the last 30 years. He is a gentleman with history of arthritis and asthma and remote history of sarcoidosis. He has history of benign prostatic hypertrophy and had followed with Dr. [name] regarding this issue. In January 2018 he started developing hematuria. His evaluation including a CT scan of the abdomen and pelvis which showed a left-sided bladder diverticular lesions consistent with urothelial neoplasm. No adenopathy or metastatic disease noted. He underwent transurethral resection of bladder tumors from the right and left lateral wall done on 04/06/2016. The pathology revealed a multifocal superficial bladder tumors that are non-invasive. Based on these findings, patient evaluated by Dr. Tresa Moore and scheduled to have primary surgical therapy with a radical cystectomy in April 2018. He was referred to me for an opinion regarding these findings. Clinically, he reports feeling reasonably well without any recent complaints. He denied any hematuria, dysuria or change in his urine function. He denied any abdominal pain or pelvic pain. He remains active and in reasonable health.  He does not report any headaches, blurry vision, syncope or seizures. He is not reporting any fevers, chills or sweats. He does not report any cough, wheezing or hemoptysis. He is not reporting nausea, vomiting or abdominal pain. He does not report any frequency urgency or hesitancy. He does not report a skeletal complaints. Remaining review of systems unremarkable.   Past Medical History:  Diagnosis Date  . Allergic rhinitis   . Arthritis   . Benign localized prostatic hyperplasia with lower urinary tract symptoms (LUTS)   . Bladder tumor   . Borderline glaucoma of right eye   . Carotid bruit    per duplex 03-11-2016 RICA 1-39%  . Chronic throat clearing   . DDD (degenerative disc disease), lumbar   . Depression    . ED (erectile dysfunction)   . Elevated PSA    urologist-  dr [name]--- s/p  prostate bx's  . GERD (gastroesophageal reflux disease)   . Hematuria   . History of chronic bronchitis   . 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 History of squamous cell carcinoma excision   . History of squamous cell carcinoma excision    several times  . Hyperlipidemia   . Pre-diabetes   . Premature ventricular contractions (PVCs) (VPCs)   . RAD (reactive airway disease)   . 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 Sensorineural hearing loss (SNHL) of both ears   . Wears glasses   . Wears hearing aid    bilateral  :  Past Surgical History:  Procedure Laterality Date  . APPENDECTOMY  2008  . CARDIOVASCULAR STRESS TEST  05/27/2010   normal nuclear study w/ no ischemia/  normal LV function and wall motion , ef 60%  . DEEP NECK LYMPH NODE BIOPSY / EXCISION  1970's   and Bronchoscopy w/ bx's ( dx Sarcoidosis)  . MOHS SURGERY  2013 approx.    nasal; pre melanoma  . PARS PLANA VITRECTOMY Right 2007   repair macular pucker  . TONSILLECTOMY AND ADENOIDECTOMY  child  . TRANSURETHRAL RESECTION OF BLADDER TUMOR N/A 04/06/2016   Procedure: CYSTOSCOPY TRANSURETHRAL RESECTION OF BLADDER TUMORS (TURBT);  Surgeon: Carolan Clines, MD;  Location: [facility];  Service: Urology;  Laterality: N/A;  .  TRANSURETHRAL RESECTION OF PROSTATE    :   Current Outpatient Prescriptions:  .  albuterol (PROVENTIL HFA;VENTOLIN HFA) 108 (90 Base) MCG/ACT inhaler, Inhale 2 puffs into the lungs every 6 (six) hours as needed for wheezing or shortness of breath., Disp: 1 Inhaler, Rfl: 8 .  finasteride (PROSCAR) 5 MG tablet, Take 5 mg by mouth every morning. , Disp: , Rfl:  .  Fluticasone-Salmeterol (ADVAIR DISKUS) 250-50 MCG/DOSE AEPB, INHALE ONE DOSE BY MOUTH EVERY 12 HOURS, Disp: 60 each, Rfl: 1 .   montelukast (SINGULAIR) 10 MG tablet, Take 1 tablet (10 mg total) by mouth daily. (Patient taking differently: Take 10 mg by mouth at bedtime. ), Disp: 90 tablet, Rfl: 0 .  neomycin-bacitracin-polymyxin (NEOSPORIN) ointment, Apply 1 application topically 2 (two) times daily. Apply to penile meatus 2x/day, Disp: 15 g, Rfl: 0 .  ranitidine (ZANTAC) 150 MG tablet, Take 1 tablet (150 mg total) by mouth at bedtime., Disp: 90 tablet, Rfl: 1 .  tamsulosin (FLOMAX) 0.4 MG CAPS capsule, Take 0.4 mg by mouth 2 (two) times daily. , Disp: , Rfl:  .  Tiotropium Bromide Monohydrate (SPIRIVA RESPIMAT) 1.25 MCG/ACT AERS, Inhale 2 puffs into the lungs daily. (Patient taking differently: Inhale 2 puffs into the lungs daily as needed. ), Disp: 1 Inhaler, Rfl: 0 .  trimethoprim (TRIMPEX) 100 MG tablet, Take 1 tablet (100 mg total) by mouth daily., Disp: 30 tablet, Rfl: 1 .  venlafaxine (EFFEXOR) 75 MG tablet, TAKE ONE TABLET BY MOUTH ONCE DAILY (Patient taking differently: TAKE ONE TABLET BY MOUTH ONCE DAILY---  takes  in am), Disp: 90 tablet, Rfl: 1:  Allergies  Allergen Reactions  . Ivp Dye [Iodinated Diagnostic Agents] Shortness Of Breath, Itching and Palpitations    "eyes itching,  Heart racing,  Effected breathing"  . Shellfish Allergy Hives, Shortness Of Breath and Itching    Mostly crab  . Gabapentin Other (See Comments)    REACTION: dizziness and flushing  . Niacin-Lovastatin Er Other (See Comments)    Did not feel good on medication  . Adhesive [Tape] Rash    Use paper tape  :  Family History  Problem Relation Age of Onset  . Stroke Mother     in her 23s  . Breast cancer Sister   . Heart disease Maternal Grandmother 84    MI   . Breast cancer Paternal Grandmother   . Heart disease Paternal Grandfather 68    MI  . Stroke Brother   . Diabetes Neg Hx   :  Social History   Social History  . Marital status: Married    Spouse name: N/A  . Number of children: N/A  . Years of education: N/A    Occupational History  . Not on file.   Social History Main Topics  . Smoking status: Never Smoker  . Smokeless tobacco: Never Used  . Alcohol use No  . Drug use: No  . Sexual activity: Not on file   Other Topics Concern  . Not on file   Social History Narrative  . No narrative on file  :  Pertinent items are noted in HPI.  Exam: Blood pressure 110/68, pulse 80, temperature 98.2 F (36.8 C), temperature source Oral, resp. rate 18, height 5\' 7"  (1.702 m), weight 173 lb 11.2 oz (78.8 kg), SpO2 97 %. General appearance: alert and cooperative appeared without distress. Head: Normocephalic, without obvious abnormality Back: symmetric, no curvature. ROM normal. No CVA tenderness. Resp: clear to auscultation bilaterally without rhonchi  or wheezes or dullness to percussion. Chest wall: no tenderness Cardio: regular rate and rhythm, S1, S2 normal, no murmur, click, rub or gallop GI: soft, non-tender; bowel sounds normal; no masses,  no organomegaly Extremities: extremities normal, atraumatic, no cyanosis or edema Pulses: 2+ and symmetric Skin: Skin color, texture, turgor normal. No rashes or lesions   CBC    Component Value Date/Time   WBC 8.1 02/18/2015 0903   RBC 4.61 02/18/2015 0903   HGB 13.9 04/06/2016 0931   HCT 41.0 04/06/2016 0931   PLT 335.0 02/18/2015 0903   MCV 91.1 02/18/2015 0903   MCHC 33.1 02/18/2015 0903   RDW 13.1 02/18/2015 0903   LYMPHSABS 2.2 02/18/2015 0903   MONOABS 0.6 02/18/2015 0903   EOSABS 0.3 02/18/2015 0903   BASOSABS 0.0 02/18/2015 0903     Chemistry      Component Value Date/Time   NA 142 04/06/2016 0931   K 3.9 04/06/2016 0931   CL 106 02/17/2016 1030   CO2 26 02/17/2016 1030   BUN 19 02/17/2016 1030   CREATININE 0.90 02/17/2016 1030      Component Value Date/Time   CALCIUM 9.3 02/17/2016 1030   ALKPHOS 78 02/17/2016 1030   AST 15 02/17/2016 1030   ALT 17 02/17/2016 1030   BILITOT 0.4 02/17/2016 1030        Assessment  and Plan:   76 year old gentleman with the following issues:  1. Superficial bladder tumors diagnosed in February 2018. He presented with hematuria and underwent TURBT on 04/06/2016. The pathology from these tumors indicate noninvasive high-grade papillary urothelial carcinoma on the right lateral wall wall and left lateral wall diverticular edge.  The natural course of this disease was discussed with the patient including noninvasive high-grade papillary urothelial carcinoma. Given the multifocal nature of this disease as well as the presence of diverticular, primary surgical therapy is his best option. He is in good health and shape and would be a good candidate for that procedure. This is scheduled by Dr. Tresa Moore to take place in April 2018.  The role for systemic therapy was reviewed today and at this point there is no role for chemotherapy. No chemotherapy is indicated for superficial bladder tumors at this time. Intravesicular therapy would be an alternative to radical cystectomy but would be an inferior option.  On his cystectomy specimen a more aggressive and muscle invasive bladder tumor is detected, potential discussion for adjuvant chemotherapy could be a possibility. The rationale for using chemotherapy in this setting was discussed today. Complications associated with platinum based chemotherapy were reviewed with the patient today as well.  All his questions were answered to his satisfaction.  2. Follow-up: Will be in 4 weeks after his surgery to discuss the possibility of adjuvant chemotherapy only he has muscle invasive disease.

## 2016-05-06 DIAGNOSIS — R8299 Other abnormal findings in urine: Secondary | ICD-10-CM | POA: Diagnosis not present

## 2016-05-06 DIAGNOSIS — C61 Malignant neoplasm of prostate: Secondary | ICD-10-CM | POA: Diagnosis not present

## 2016-05-06 DIAGNOSIS — C672 Malignant neoplasm of lateral wall of bladder: Secondary | ICD-10-CM | POA: Diagnosis not present

## 2016-05-16 ENCOUNTER — Encounter (HOSPITAL_COMMUNITY): Payer: Self-pay | Admitting: Emergency Medicine

## 2016-05-16 ENCOUNTER — Emergency Department (HOSPITAL_COMMUNITY)
Admission: EM | Admit: 2016-05-16 | Discharge: 2016-05-16 | Disposition: A | Discharge status: Home / Self Care | Payer: Medicare Other | Attending: Emergency Medicine | Admitting: Emergency Medicine

## 2016-05-16 ENCOUNTER — Emergency Department (HOSPITAL_COMMUNITY): Payer: Medicare Other

## 2016-05-16 ENCOUNTER — Ambulatory Visit (INDEPENDENT_AMBULATORY_CARE_PROVIDER_SITE_OTHER): Payer: Medicare Other | Admitting: Internal Medicine

## 2016-05-16 ENCOUNTER — Encounter: Payer: Self-pay | Admitting: Internal Medicine

## 2016-05-16 VITALS — BP 90/54 | HR 89 | Temp 98.1°F | Wt 174.8 lb

## 2016-05-16 DIAGNOSIS — R05 Cough: Secondary | ICD-10-CM | POA: Diagnosis not present

## 2016-05-16 DIAGNOSIS — E860 Dehydration: Secondary | ICD-10-CM

## 2016-05-16 DIAGNOSIS — I959 Hypotension, unspecified: Secondary | ICD-10-CM | POA: Diagnosis not present

## 2016-05-16 DIAGNOSIS — J45909 Unspecified asthma, uncomplicated: Secondary | ICD-10-CM | POA: Insufficient documentation

## 2016-05-16 DIAGNOSIS — J111 Influenza due to unidentified influenza virus with other respiratory manifestations: Secondary | ICD-10-CM | POA: Diagnosis not present

## 2016-05-16 DIAGNOSIS — R52 Pain, unspecified: Secondary | ICD-10-CM

## 2016-05-16 DIAGNOSIS — R509 Fever, unspecified: Secondary | ICD-10-CM | POA: Insufficient documentation

## 2016-05-16 DIAGNOSIS — R69 Illness, unspecified: Secondary | ICD-10-CM

## 2016-05-16 DIAGNOSIS — R42 Dizziness and giddiness: Secondary | ICD-10-CM | POA: Diagnosis present

## 2016-05-16 DIAGNOSIS — Z79899 Other long term (current) drug therapy: Secondary | ICD-10-CM | POA: Insufficient documentation

## 2016-05-16 LAB — URINALYSIS, ROUTINE W REFLEX MICROSCOPIC
BILIRUBIN URINE: NEGATIVE
Bacteria, UA: NONE SEEN
GLUCOSE, UA: NEGATIVE mg/dL
Hgb urine dipstick: NEGATIVE
KETONES UR: NEGATIVE mg/dL
Nitrite: NEGATIVE
PH: 5 (ref 5.0–8.0)
Protein, ur: 30 mg/dL — AB
Specific Gravity, Urine: 1.021 (ref 1.005–1.030)

## 2016-05-16 LAB — CBC WITH DIFFERENTIAL/PLATELET
Basophils Absolute: 0 10*3/uL (ref 0.0–0.1)
Basophils Relative: 0 %
EOS PCT: 1 %
Eosinophils Absolute: 0.2 10*3/uL (ref 0.0–0.7)
HCT: 39.1 % (ref 39.0–52.0)
Hemoglobin: 13.3 g/dL (ref 13.0–17.0)
LYMPHS ABS: 2.7 10*3/uL (ref 0.7–4.0)
Lymphocytes Relative: 21 %
MCH: 30.4 pg (ref 26.0–34.0)
MCHC: 34 g/dL (ref 30.0–36.0)
MCV: 89.5 fL (ref 78.0–100.0)
MONOS PCT: 7 %
Monocytes Absolute: 0.9 10*3/uL (ref 0.1–1.0)
Neutro Abs: 8.9 10*3/uL — ABNORMAL HIGH (ref 1.7–7.7)
Neutrophils Relative %: 71 %
PLATELETS: 303 10*3/uL (ref 150–400)
RBC: 4.37 MIL/uL (ref 4.22–5.81)
RDW: 13.1 % (ref 11.5–15.5)
WBC: 12.7 10*3/uL — AB (ref 4.0–10.5)

## 2016-05-16 LAB — COMPREHENSIVE METABOLIC PANEL
ALBUMIN: 3.3 g/dL — AB (ref 3.5–5.0)
ALK PHOS: 71 U/L (ref 38–126)
ALT: 26 U/L (ref 17–63)
ANION GAP: 3 — AB (ref 5–15)
AST: 24 U/L (ref 15–41)
BUN: 24 mg/dL — AB (ref 6–20)
CALCIUM: 8.5 mg/dL — AB (ref 8.9–10.3)
CO2: 24 mmol/L (ref 22–32)
Chloride: 109 mmol/L (ref 101–111)
Creatinine, Ser: 0.88 mg/dL (ref 0.61–1.24)
GFR calc Af Amer: >60 mL/min (ref >60)
GFR calc non Af Amer: >60 mL/min (ref >60)
GLUCOSE: 103 mg/dL — AB (ref 65–99)
Potassium: 3.8 mmol/L (ref 3.5–5.1)
SODIUM: 136 mmol/L (ref 135–145)
Total Bilirubin: 0.6 mg/dL (ref 0.3–1.2)
Total Protein: 6.4 g/dL — ABNORMAL LOW (ref 6.5–8.1)

## 2016-05-16 LAB — POCT INFLUENZA A/B
Influenza A, POC: NEGATIVE
Influenza B, POC: NEGATIVE

## 2016-05-16 LAB — I-STAT CG4 LACTIC ACID, ED: Lactic Acid, Venous: 0.93 mmol/L (ref 0.5–1.9)

## 2016-05-16 MED ORDER — ACETAMINOPHEN 500 MG PO TABS
1000.0000 mg | ORAL_TABLET | Freq: Once | ORAL | Status: AC
  Administered 2016-05-16: 1000 mg via ORAL
  Filled 2016-05-16: qty 2

## 2016-05-16 MED ORDER — SODIUM CHLORIDE 0.9 % IV BOLUS (SEPSIS)
1000.0000 mL | Freq: Once | INTRAVENOUS | Status: AC
  Administered 2016-05-16: 1000 mL via INTRAVENOUS

## 2016-05-16 NOTE — Discharge Instructions (Signed)
You likely have a virus causing your symptoms. Keep well hydrated and well nourished.   Follow-up closely with your PCP for recheck in the next few days.  Return without fail for worsening symptoms, including confusion, difficulty breathing, passing out, or any other symptoms concerning to you.

## 2016-05-16 NOTE — Assessment & Plan Note (Signed)
Acute fever with body aches, etc.  Flu swab negative.  Discussed still possibility of flu vs other acute infection.  Given some increased dizziness, decreased blood pressure and what he describes as more labored breathing along with presenting symptoms of body aches and fever, I felt he needed further labs, xray,etc.  Discussed the need for further w/up.  He is in agreement.  Wife to transport to ER for further evaluation.  ER notified.

## 2016-05-16 NOTE — ED Notes (Signed)
ED Provider at bedside. 

## 2016-05-16 NOTE — ED Notes (Signed)
Patient states dizziness was present a couple days ago but does not feel dizzy today.

## 2016-05-16 NOTE — ED Notes (Signed)
Patient ambulated down the hallway maining oxygen saturation at 94-96%. Patient denies dizziness or shortness of breath.

## 2016-05-16 NOTE — Assessment & Plan Note (Addendum)
Pt with aching, some labored breathing at times, fever.  Flu test negative.  Given history and symptoms, feel need more information to determine full etiology.  Discussed the need for lab, cxr, etc.  Pt in agreement for ER evaluation.  ER notified.

## 2016-05-16 NOTE — ED Notes (Signed)
Pt aware a urine sample is needed. Urinal at bedside.

## 2016-05-16 NOTE — ED Provider Notes (Signed)
[facility] DEPT Provider Note   CSN: [id number] Arrival date & time: 05/16/16  1101     History   Chief Complaint Chief Complaint  Patient presents with  . Hypotension  . Dizziness    HPI Adrian Rios is a 76 y.o. male.  HPI 76 year old male who presents with cough, generalized weakness, body aches and hypotension. He has a history of reactive airway disease, sarcoidosis, bladder tumor with chronic hematuria, with plans for operative management next month. States onset of generalized weakness, fatigue, body aches, generalized headache, mild productive cough, with subjective fevers and chills starting 2 days ago. Has had associated multiple episodes of nonbloody diarrhea. No nausea or vomiting. Has not taken any medications for symptoms. Was seen by PCP today, tested negative for influenza. Had soft blood pressures systolic blood pressures in the 90s and sent to ED for evaluation. Has been intermittently feeling a little dizzy when standing up and walking. No syncope, melena or hematochezia, or decreased by mouth intake. No known sick contacts. No recent travel.   Past Medical History:  Diagnosis Date  . Allergic rhinitis   . Arthritis   . Benign localized prostatic hyperplasia with lower urinary tract symptoms (LUTS)   . Bladder tumor   . Borderline glaucoma of right eye   . Carotid bruit    per duplex 03-11-2016 RICA 1-39%  . Chronic throat clearing   . DDD (degenerative disc disease), lumbar   . Depression   . ED (erectile dysfunction)   . Elevated PSA    urologist-  dr [name]--- s/p  prostate bx's  . GERD (gastroesophageal reflux disease)   . Hematuria   . History of chronic bronchitis   . 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 History of squamous cell carcinoma excision   . History of squamous cell carcinoma excision    several times  . Hyperlipidemia   . Pre-diabetes   . Premature ventricular contractions (PVCs) (VPCs)   .  RAD (reactive airway disease)   . 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 Sensorineural hearing loss (SNHL) of both ears   . Wears glasses   . Wears hearing aid    bilateral    Patient Active Problem List   Diagnosis Date Noted  . Hypotension 05/16/2016  . Fever 05/16/2016  . Carotid bruit 02/17/2016  . Prediabetes 08/20/2015  . Overweight (BMI 25.0-29.9) 08/20/2015  . Esophageal reflux 08/20/2015  . Glaucoma 02/14/2015  . Varicose veins of lower extremities with other complications 62/95/2841  . RAD (reactive airway disease) 10/27/2011  . 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 Anemia, unspecified 10/27/2011  . 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 ERECTILE DYSFUNCTION 04/23/2010  . [place] DISEASE, LUMBOSACRAL SPINE 02/06/2010  . 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 Depression 04/12/2008  . Allergic rhinitis 04/12/2008  . ELEVATED PROSTATE SPECIFIC ANTIGEN 04/12/2008  . Hyperlipidemia 10/05/2006  . Sarcoidosis ([place]) 07/05/2006    Past Surgical History:  Procedure Laterality Date  . APPENDECTOMY  2008  . CARDIOVASCULAR STRESS TEST  05/27/2010   normal nuclear study w/ no ischemia/  normal LV function and wall motion , ef 60%  . DEEP NECK LYMPH NODE BIOPSY / EXCISION  1970's   and Bronchoscopy w/ bx's ( dx Sarcoidosis)  . MOHS SURGERY  2013 approx.    nasal; pre melanoma  .  PARS PLANA VITRECTOMY Right 2007   repair macular pucker  . TONSILLECTOMY AND ADENOIDECTOMY  child  . TRANSURETHRAL RESECTION OF BLADDER TUMOR N/A 04/06/2016   Procedure: CYSTOSCOPY TRANSURETHRAL RESECTION OF BLADDER TUMORS (TURBT);  Surgeon: Carolan Clines, MD;  Location: [facility];  Service: Urology;  Laterality: N/A;  . TRANSURETHRAL RESECTION OF PROSTATE         Home Medications    Prior to Admission medications   Medication Sig Start Date End Date Taking? Authorizing  Provider  albuterol (PROVENTIL HFA;VENTOLIN HFA) 108 (90 Base) MCG/ACT inhaler Inhale 2 puffs into the lungs every 6 (six) hours as needed for wheezing or shortness of breath. 08/27/15  Yes [name], MD  finasteride (PROSCAR) 5 MG tablet Take 5 mg by mouth every morning.    Yes Historical Provider, MD  Fluticasone-Salmeterol (ADVAIR DISKUS) 250-50 MCG/DOSE AEPB INHALE ONE DOSE BY MOUTH EVERY 12 HOURS 04/20/16  Yes [name], MD  montelukast (SINGULAIR) 10 MG tablet Take 1 tablet (10 mg total) by mouth daily. Patient taking differently: Take 10 mg by mouth at bedtime.  06/18/15  Yes [name], MD  ranitidine (ZANTAC) 150 MG tablet Take 1 tablet (150 mg total) by mouth at bedtime. 08/20/15  Yes [name], MD  tamsulosin (FLOMAX) 0.4 MG CAPS capsule Take 0.4 mg by mouth 2 (two) times daily.    Yes Historical Provider, MD  venlafaxine (EFFEXOR) 75 MG tablet TAKE ONE TABLET BY MOUTH ONCE DAILY 12/17/15  Yes [name], MD  Tiotropium Bromide Monohydrate (SPIRIVA RESPIMAT) 1.25 MCG/ACT AERS Inhale 2 puffs into the lungs daily. Patient not taking: Reported on 05/16/2016 08/20/15   [name], MD    Family History Family History  Problem Relation Age of Onset  . Stroke Mother     in her 4s  . Breast cancer Sister   . Heart disease Maternal Grandmother 84    MI   . Breast cancer Paternal Grandmother   . Heart disease Paternal Grandfather 38    MI  . Stroke Brother   . Diabetes Neg Hx     Social History Social History  Substance Use Topics  . Smoking status: Never Smoker  . Smokeless tobacco: Never Used  . Alcohol use No     Allergies   Ivp dye [iodinated diagnostic agents]; Shellfish allergy; Gabapentin; Niacin-lovastatin er; and Adhesive [tape]   Review of Systems Review of Systems 10/14 systems reviewed and are negative other than those stated in the HPI   Physical Exam Updated Vital Signs BP 117/90   Pulse 89   Temp 98 F (36.7 C) (Oral)   Resp (!) 21    SpO2 93%   Physical Exam Physical Exam  Nursing note and vitals reviewed. Constitutional: Well developed, well nourished, non-toxic, and in no acute distress Head: Normocephalic and atraumatic.  Mouth/Throat: Oropharynx is clear and mildly dry mucous membranes.  Neck: Normal range of motion. Neck supple. no meningismus.  Cardiovascular: Normal rate and regular rhythm.   Pulmonary/Chest: Effort normal and breath sounds normal.  Abdominal: Soft. There is no tenderness. There is no rebound and no guarding.  Musculoskeletal: Normal range of motion.  Neurological: Alert, no facial droop, fluent speech, moves all extremities symmetrically Skin: Skin is warm and dry.  Psychiatric: Cooperative   ED Treatments / Results  Labs (all labs ordered are listed, but only abnormal results are displayed) Labs Reviewed  CBC WITH DIFFERENTIAL/PLATELET - Abnormal; Notable for the following:  Result Value   WBC 12.7 (*)    Neutro Abs 8.9 (*)    All other components within normal limits  COMPREHENSIVE METABOLIC PANEL - Abnormal; Notable for the following:    Glucose, Bld 103 (*)    BUN 24 (*)    Calcium 8.5 (*)    Total Protein 6.4 (*)    Albumin 3.3 (*)    Anion gap 3 (*)    All other components within normal limits  URINALYSIS, ROUTINE W REFLEX MICROSCOPIC - Abnormal; Notable for the following:    APPearance HAZY (*)    Protein, ur 30 (*)    Leukocytes, UA SMALL (*)    Squamous Epithelial / LPF 0-5 (*)    Non Squamous Epithelial 0-5 (*)    All other components within normal limits  URINE CULTURE  I-STAT CG4 LACTIC ACID, ED  I-STAT CG4 LACTIC ACID, ED    EKG  EKG Interpretation  Date/Time:  Saturday May 16 2016 12:23:07 EDT Ventricular Rate:  70 PR Interval:    QRS Duration: 96 QT Interval:  388 QTC Calculation: 419 R Axis:   51 Text Interpretation:  Sinus rhythm no acute changes  Confirmed by Darleth Eustache MD, Kyelle Urbas [phone]) on 05/16/2016 1:00:32 PM       Radiology Dg Chest 2  View  Result Date: 05/16/2016 CLINICAL DATA:  Dizziness and cough 2 days. Hypotension. History of sarcoidosis. EXAM: CHEST  2 VIEW COMPARISON:  10/29/2011 FINDINGS: Lungs are somewhat hypoinflated without focal airspace consolidation or effusion. Cardiomediastinal silhouette is within normal. There is minimal calcified plaque over the aortic arch. There are mild degenerative changes of the spine. IMPRESSION: No active cardiopulmonary disease. Electronically Signed   By: Marin Olp M.D.   On: 05/16/2016 12:51    Procedures Procedures (including critical care time)  Medications Ordered in ED Medications  sodium chloride 0.9 % bolus 1,000 mL (0 mLs Intravenous Stopped 05/16/16 1350)  sodium chloride 0.9 % bolus 1,000 mL (0 mLs Intravenous Stopped 05/16/16 1556)  acetaminophen (TYLENOL) tablet 1,000 mg (1,000 mg Oral Given 05/16/16 1436)     Initial Impression / Assessment and Plan / ED Course  I have reviewed the triage vital signs and the nursing notes.  Pertinent labs & imaging results that were available during my care of the patient were reviewed by me and considered in my medical decision making (see chart for details).     76 year old male who presents with 2 days of cough, body aches, fevers and chills, and diarrhea. Nontoxic and in no acute distress. Initial triage blood pressure 90/54, but during remainder of ED visit he is normotensive. Does show some orthostasis when standing up. May be related to dehydration given multiple episodes of diarrhea.  Here he tested negative for influenza as his doctor's office. Chest x-ray visualized and does not show infiltrate or other acute cardiopulmonary processes. UA with too numerous to count WBCs, but no bacteria, nitrites or significant leukocytes. Is not convincing for infection and had not having urinary complaints. We'll send for culture. Presentation seems consistent with likely viral respiratory illness. He is given IV fluids and tylenol  for HA. Feels improved after supportive treatment. Repeat orthostatics reassuring.   He is felt to be appropriate for outpatient management with continued supportive care for home. He will follow up with PCP in 2-3 days for recheck.  The patient appears reasonably screened and/or stabilized for discharge and I doubt any other medical condition or other [facility] requiring further screening,  evaluation, or treatment in the ED at this time prior to discharge.  Strict return and follow-up instructions reviewed. He expressed understanding of all discharge instructions and felt comfortable with the plan of care.   Final Clinical Impressions(s) / ED Diagnoses   Final diagnoses:  Dehydration  Influenza-like illness    New Prescriptions New Prescriptions   No medications on file     Forde Dandy, MD 05/16/16 1622

## 2016-05-16 NOTE — ED Notes (Signed)
Discharge instructions reviewed with patient. Patient verbalized understanding. 

## 2016-05-16 NOTE — ED Notes (Signed)
Patient transported to X-ray 

## 2016-05-16 NOTE — ED Triage Notes (Signed)
Patient sent from [facility] with complaints of hypotension 90s/50s in the office. Dizziness. Patient was in the office with complaints of  increased fever, aching and headache. Symptoms started yesterday pm, but he has noticed increased labored breathing recently.  Also has noticed being more dizzy.

## 2016-05-16 NOTE — Progress Notes (Signed)
Pre visit review using our clinic review tool, if applicable. No additional management support is needed unless otherwise documented below in the visit note. 

## 2016-05-16 NOTE — Progress Notes (Signed)
Patient ID: Adrian Rios, male   DOB: 10/31/1940, 76 y.o.   MRN: [id number]   Subjective:    Patient ID: Adrian Rios, male    DOB: 06/14/1940, 76 y.o.   MRN: [id number]  HPI  Patient here as a work in with concerns regarding increased fever, aching and headache.  Has a history of sarcoid and bladder cancer.  Planning for surgery in 05/2016.  Symptoms started yesterday pm, but he has noticed increased labored breathing recently.  Also has noticed being more dizzy as well.  Yesterday noticed increased fever,some diarrhea and chills.  Some cough. No chest congestion.  Headache.  Took tylenol at 5:00 am.  Denies any recent bites.    Past Medical History:  Diagnosis Date  . Allergic rhinitis   . Arthritis   . Benign localized prostatic hyperplasia with lower urinary tract symptoms (LUTS)   . Bladder tumor   . Borderline glaucoma of right eye   . Carotid bruit    per duplex 03-11-2016 RICA 1-39%  . Chronic throat clearing   . DDD (degenerative disc disease), lumbar   . Depression   . ED (erectile dysfunction)   . Elevated PSA    urologist-  dr [name]--- s/p  prostate bx's  . GERD (gastroesophageal reflux disease)   . Hematuria   . History of chronic bronchitis   . 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 History of squamous cell carcinoma excision   . History of squamous cell carcinoma excision    several times  . Hyperlipidemia   . Pre-diabetes   . Premature ventricular contractions (PVCs) (VPCs)   . RAD (reactive airway disease)   . 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 Sensorineural hearing loss (SNHL) of both ears   . Wears glasses   . Wears hearing aid    bilateral   Past Surgical History:  Procedure Laterality Date  . APPENDECTOMY  2008  . CARDIOVASCULAR STRESS TEST  05/27/2010   normal nuclear study w/ no ischemia/  normal LV function and wall motion , ef  60%  . DEEP NECK LYMPH NODE BIOPSY / EXCISION  1970's   and Bronchoscopy w/ bx's ( dx Sarcoidosis)  . MOHS SURGERY  2013 approx.    nasal; pre melanoma  . PARS PLANA VITRECTOMY Right 2007   repair macular pucker  . TONSILLECTOMY AND ADENOIDECTOMY  child  . TRANSURETHRAL RESECTION OF BLADDER TUMOR N/A 04/06/2016   Procedure: CYSTOSCOPY TRANSURETHRAL RESECTION OF BLADDER TUMORS (TURBT);  Surgeon: Carolan Clines, MD;  Location: [facility];  Service: Urology;  Laterality: N/A;  . TRANSURETHRAL RESECTION OF PROSTATE     Family History  Problem Relation Age of Onset  . Stroke Mother     in her 68s  . Breast cancer Sister   . Heart disease Maternal Grandmother 84    MI   . Breast cancer Paternal Grandmother   . Heart disease Paternal Grandfather 8    MI  . Stroke Brother   . Diabetes Neg Hx    Social History   Social History  . Marital status: Married    Spouse name: N/A  . Number of children: N/A  . Years of education: N/A   Social History Main Topics  . Smoking status: Never Smoker  . Smokeless tobacco: Never Used  .  Alcohol use No  . Drug use: No  . Sexual activity: Not Asked   Other Topics Concern  . None   Social History Narrative  . None    Outpatient Encounter Prescriptions as of 05/16/2016  Medication Sig  . albuterol (PROVENTIL HFA;VENTOLIN HFA) 108 (90 Base) MCG/ACT inhaler Inhale 2 puffs into the lungs every 6 (six) hours as needed for wheezing or shortness of breath.  . finasteride (PROSCAR) 5 MG tablet Take 5 mg by mouth every morning.   . Fluticasone-Salmeterol (ADVAIR DISKUS) 250-50 MCG/DOSE AEPB INHALE ONE DOSE BY MOUTH EVERY 12 HOURS  . montelukast (SINGULAIR) 10 MG tablet Take 1 tablet (10 mg total) by mouth daily. (Patient taking differently: Take 10 mg by mouth at bedtime. )  . ranitidine (ZANTAC) 150 MG tablet Take 1 tablet (150 mg total) by mouth at bedtime.  . tamsulosin (FLOMAX) 0.4 MG CAPS capsule Take 0.4 mg by mouth 2 (two)  times daily.   . Tiotropium Bromide Monohydrate (SPIRIVA RESPIMAT) 1.25 MCG/ACT AERS Inhale 2 puffs into the lungs daily. (Patient not taking: Reported on 05/16/2016)  . venlafaxine (EFFEXOR) 75 MG tablet TAKE ONE TABLET BY MOUTH ONCE DAILY  . [DISCONTINUED] trimethoprim (TRIMPEX) 100 MG tablet Take 1 tablet (100 mg total) by mouth daily.  . [DISCONTINUED] neomycin-bacitracin-polymyxin (NEOSPORIN) ointment Apply 1 application topically 2 (two) times daily. Apply to penile meatus 2x/day   No facility-administered encounter medications on file as of 05/16/2016.     Review of Systems  Constitutional: Positive for chills, fatigue and fever.  HENT: Negative for congestion and sinus pressure.   Respiratory: Positive for cough and shortness of breath. Negative for chest tightness.        Described his breathing as more labored.    Cardiovascular: Negative for chest pain, palpitations and leg swelling.  Gastrointestinal: Positive for diarrhea. Negative for abdominal pain and vomiting.  Genitourinary: Negative for difficulty urinating and dysuria.  Musculoskeletal: Negative for joint swelling.       Aching.  No neck pain or stiffness.    Skin: Negative for color change and rash.  Neurological: Positive for dizziness, light-headedness and headaches.  Psychiatric/Behavioral: Negative for agitation and dysphoric mood.       Objective:     Blood pressure rechecked by me:  100-102/68  Physical Exam  Constitutional: He appears well-developed and well-nourished.  Appears not to feel well.   Neck: Neck supple.  Cardiovascular: Normal rate and regular rhythm.   Pulmonary/Chest: Effort normal and breath sounds normal. No respiratory distress.  Abdominal: Soft. Bowel sounds are normal. There is no tenderness.  Musculoskeletal: He exhibits no edema or tenderness.  Lymphadenopathy:    He has no cervical adenopathy.  Skin: No rash noted. No erythema.  Psychiatric: He has a normal mood and affect. His  behavior is normal.    BP (!) 90/54   Pulse 89   Temp 98.1 F (36.7 C) (Oral)   Wt 174 lb 12.8 oz (79.3 kg)   SpO2 96%   BMI 27.38 kg/m  Wt Readings from Last 3 Encounters:  05/16/16 174 lb 12.8 oz (79.3 kg)  04/28/16 173 lb 11.2 oz (78.8 kg)  04/07/16 170 lb (77.1 kg)     Lab Results  Component Value Date   WBC 12.7 (H) 05/16/2016   HGB 13.3 05/16/2016   HCT 39.1 05/16/2016   PLT 303 05/16/2016   GLUCOSE 103 (H) 05/16/2016   CHOL 168 02/17/2016   TRIG 138.0 02/17/2016   HDL 44.80 02/17/2016  LDLCALC 96 02/17/2016   ALT 26 05/16/2016   AST 24 05/16/2016   NA 136 05/16/2016   K 3.8 05/16/2016   CL 109 05/16/2016   CREATININE 0.88 05/16/2016   BUN 24 (H) 05/16/2016   CO2 24 05/16/2016   TSH 2.48 02/18/2015   PSA 0.29 07/11/2009   INR 0.9 RATIO 04/18/2007   HGBA1C 5.8 02/17/2016   MICROALBUR 0.3 06/29/2006    US Venous Img Lower Unilateral Right  Result Date: 04/07/2016 CLINICAL DATA:  Right leg pain and edema history of varicose veins EXAM: Right LOWER EXTREMITY VENOUS DOPPLER ULTRASOUND TECHNIQUE: Gray-scale sonography with graded compression, as well as color Doppler and duplex ultrasound were performed to evaluate the lower extremity deep venous systems from the level of the common femoral vein and including the common femoral, femoral, profunda femoral, popliteal and calf veins including the posterior tibial, peroneal and gastrocnemius veins when visible. The superficial great saphenous vein was also interrogated. Spectral Doppler was utilized to evaluate flow at rest and with distal augmentation maneuvers in the common femoral, femoral and popliteal veins. COMPARISON:  None. FINDINGS: Contralateral Common Femoral Vein: Respiratory phasicity is normal and symmetric with the symptomatic side. No evidence of thrombus. Normal compressibility. Common Femoral Vein: No evidence of thrombus. Normal compressibility, respiratory phasicity and response to augmentation.  Saphenofemoral Junction: No evidence of thrombus. Normal compressibility and flow on color Doppler imaging. Profunda Femoral Vein: No evidence of thrombus. Normal compressibility and flow on color Doppler imaging. Femoral Vein: No evidence of thrombus. Normal compressibility, respiratory phasicity and response to augmentation. Popliteal Vein: No evidence of thrombus. Normal compressibility, respiratory phasicity and response to augmentation. Calf Veins: No evidence of thrombus. Normal compressibility and flow on color Doppler imaging. Superficial Great Saphenous Vein: No evidence of thrombus. Normal compressibility and flow on color Doppler imaging. Venous Reflux:  None. Other Findings:  None. IMPRESSION: No evidence of deep venous thrombosis. Electronically Signed   By: Donavan Foil M.D.   On: 04/07/2016 20:28       Assessment & Plan:   Problem List Items Addressed This Visit    Fever    Acute fever with body aches, etc.  Flu swab negative.  Discussed still possibility of flu vs other acute infection.  Given some increased dizziness, decreased blood pressure and what he describes as more labored breathing along with presenting symptoms of body aches and fever, I felt he needed further labs, xray,etc.  Discussed the need for further w/up.  He is in agreement.  Wife to transport to ER for further evaluation.  ER notified.        Hypotension    Pt with aching, some labored breathing at times, fever.  Flu test negative.  Given history and symptoms, feel need more information to determine full etiology.  Discussed the need for lab, cxr, etc.  Pt in agreement for ER evaluation.  ER notified.         Other Visit Diagnoses    Body aches    -  Primary   Relevant Orders   POCT Influenza A/B (Completed)       Einar Pheasant, MD

## 2016-05-17 LAB — URINE CULTURE: CULTURE: NO GROWTH

## 2016-05-18 ENCOUNTER — Telehealth: Payer: Self-pay | Admitting: Internal Medicine

## 2016-05-18 NOTE — Telephone Encounter (Signed)
Please advise shouldn't patient follow up with PCP

## 2016-05-18 NOTE — Telephone Encounter (Signed)
I do agree with f/u.  I am ok with f/u with pcp.  Let me know if any problems.

## 2016-05-18 NOTE — Telephone Encounter (Signed)
Spoke to patient he is going to call pcp office for app and will let us know if any problems.

## 2016-05-18 NOTE — Telephone Encounter (Signed)
Pt called and stated that the ED suggested that pt follow up with Dr. Nicki Reaper since she saw him on Saturday. Please advise, thank you!  Call pt @ [phone]

## 2016-05-25 ENCOUNTER — Encounter: Payer: Self-pay | Admitting: Internal Medicine

## 2016-06-11 ENCOUNTER — Inpatient Hospital Stay (HOSPITAL_COMMUNITY)
Admission: RE | Admit: 2016-06-11 | Discharge: 2016-06-18 | DRG: 654 | Disposition: A | Discharge status: Home Health | Payer: Medicare Other | Source: Ambulatory Visit | Attending: Urology | Admitting: Urology

## 2016-06-11 ENCOUNTER — Encounter (HOSPITAL_COMMUNITY): Payer: Self-pay

## 2016-06-11 DIAGNOSIS — I739 Peripheral vascular disease, unspecified: Secondary | ICD-10-CM | POA: Diagnosis present

## 2016-06-11 DIAGNOSIS — N281 Cyst of kidney, acquired: Secondary | ICD-10-CM | POA: Diagnosis present

## 2016-06-11 DIAGNOSIS — Z85820 Personal history of malignant melanoma of skin: Secondary | ICD-10-CM | POA: Diagnosis not present

## 2016-06-11 DIAGNOSIS — C61 Malignant neoplasm of prostate: Secondary | ICD-10-CM | POA: Diagnosis present

## 2016-06-11 DIAGNOSIS — Z91013 Allergy to seafood: Secondary | ICD-10-CM | POA: Diagnosis not present

## 2016-06-11 DIAGNOSIS — N323 Diverticulum of bladder: Secondary | ICD-10-CM | POA: Diagnosis present

## 2016-06-11 DIAGNOSIS — E785 Hyperlipidemia, unspecified: Secondary | ICD-10-CM | POA: Diagnosis not present

## 2016-06-11 DIAGNOSIS — Z91041 Radiographic dye allergy status: Secondary | ICD-10-CM | POA: Diagnosis not present

## 2016-06-11 DIAGNOSIS — Z803 Family history of malignant neoplasm of breast: Secondary | ICD-10-CM | POA: Diagnosis not present

## 2016-06-11 DIAGNOSIS — C675 Malignant neoplasm of bladder neck: Principal | ICD-10-CM | POA: Diagnosis present

## 2016-06-11 DIAGNOSIS — Z936 Other artificial openings of urinary tract status: Secondary | ICD-10-CM

## 2016-06-11 DIAGNOSIS — C679 Malignant neoplasm of bladder, unspecified: Secondary | ICD-10-CM | POA: Diagnosis present

## 2016-06-11 DIAGNOSIS — C672 Malignant neoplasm of lateral wall of bladder: Secondary | ICD-10-CM | POA: Diagnosis not present

## 2016-06-11 DIAGNOSIS — Z888 Allergy status to other drugs, medicaments and biological substances status: Secondary | ICD-10-CM

## 2016-06-11 DIAGNOSIS — K219 Gastro-esophageal reflux disease without esophagitis: Secondary | ICD-10-CM | POA: Diagnosis present

## 2016-06-11 DIAGNOSIS — K567 Ileus, unspecified: Secondary | ICD-10-CM | POA: Diagnosis not present

## 2016-06-11 DIAGNOSIS — Z91048 Other nonmedicinal substance allergy status: Secondary | ICD-10-CM

## 2016-06-11 DIAGNOSIS — C678 Malignant neoplasm of overlapping sites of bladder: Secondary | ICD-10-CM | POA: Diagnosis not present

## 2016-06-11 DIAGNOSIS — I959 Hypotension, unspecified: Secondary | ICD-10-CM | POA: Diagnosis not present

## 2016-06-11 DIAGNOSIS — C673 Malignant neoplasm of anterior wall of bladder: Secondary | ICD-10-CM | POA: Diagnosis not present

## 2016-06-11 LAB — ABO/RH: ABO/RH(D): AB POS

## 2016-06-11 LAB — CBC
HEMATOCRIT: 40.9 % (ref 39.0–52.0)
HEMOGLOBIN: 13.9 g/dL (ref 13.0–17.0)
MCH: 30.6 pg (ref 26.0–34.0)
MCHC: 34 g/dL (ref 30.0–36.0)
MCV: 90.1 fL (ref 78.0–100.0)
PLATELETS: 341 10*3/uL (ref 150–400)
RBC: 4.54 MIL/uL (ref 4.22–5.81)
RDW: 13 % (ref 11.5–15.5)
WBC: 8.4 10*3/uL (ref 4.0–10.5)

## 2016-06-11 LAB — COMPREHENSIVE METABOLIC PANEL
ALK PHOS: 82 U/L (ref 38–126)
ALT: 19 U/L (ref 17–63)
AST: 22 U/L (ref 15–41)
Albumin: 4 g/dL (ref 3.5–5.0)
Anion gap: 7 (ref 5–15)
BUN: 25 mg/dL — ABNORMAL HIGH (ref 6–20)
CALCIUM: 8.9 mg/dL (ref 8.9–10.3)
CHLORIDE: 108 mmol/L (ref 101–111)
CO2: 24 mmol/L (ref 22–32)
CREATININE: 0.94 mg/dL (ref 0.61–1.24)
Glucose, Bld: 103 mg/dL — ABNORMAL HIGH (ref 65–99)
Potassium: 4 mmol/L (ref 3.5–5.1)
Sodium: 139 mmol/L (ref 135–145)
Total Bilirubin: 0.6 mg/dL (ref 0.3–1.2)
Total Protein: 7.1 g/dL (ref 6.5–8.1)

## 2016-06-11 LAB — PREPARE RBC (CROSSMATCH)

## 2016-06-11 LAB — SURGICAL PCR SCREEN
MRSA, PCR: NEGATIVE
STAPHYLOCOCCUS AUREUS: NEGATIVE

## 2016-06-11 MED ORDER — ALBUTEROL SULFATE (2.5 MG/3ML) 0.083% IN NEBU: 2.5000 mg | INHALATION_SOLUTION | Freq: Four times a day (QID) | RESPIRATORY_TRACT | Status: DC | PRN

## 2016-06-11 MED ORDER — PIPERACILLIN-TAZOBACTAM 3.375 G IVPB 30 MIN
3.3750 g | INTRAVENOUS | Status: AC
  Administered 2016-06-12: 3.375 g via INTRAVENOUS
  Filled 2016-06-11: qty 50

## 2016-06-11 MED ORDER — FAMOTIDINE 20 MG PO TABS
20.0000 mg | ORAL_TABLET | Freq: Every day | ORAL | Status: DC
  Administered 2016-06-11 – 2016-06-18 (×7): 20 mg via ORAL
  Filled 2016-06-11 (×7): qty 1

## 2016-06-11 MED ORDER — MOMETASONE FURO-FORMOTEROL FUM 200-5 MCG/ACT IN AERO
2.0000 | INHALATION_SPRAY | Freq: Two times a day (BID) | RESPIRATORY_TRACT | Status: DC
  Administered 2016-06-11 – 2016-06-18 (×13): 2 via RESPIRATORY_TRACT
  Filled 2016-06-11 (×2): qty 8.8

## 2016-06-11 MED ORDER — PEG 3350-KCL-NA BICARB-NACL 420 G PO SOLR
4000.0000 mL | Freq: Once | ORAL | Status: AC
  Administered 2016-06-11: 4000 mL via ORAL

## 2016-06-11 MED ORDER — METRONIDAZOLE 500 MG PO TABS
500.0000 mg | ORAL_TABLET | Freq: Three times a day (TID) | ORAL | Status: AC
  Administered 2016-06-11 (×2): 500 mg via ORAL
  Filled 2016-06-11 (×2): qty 1

## 2016-06-11 MED ORDER — ALVIMOPAN 12 MG PO CAPS
12.0000 mg | ORAL_CAPSULE | Freq: Once | ORAL | Status: AC
  Administered 2016-06-12: 12 mg via ORAL
  Filled 2016-06-11: qty 1

## 2016-06-11 MED ORDER — MONTELUKAST SODIUM 10 MG PO TABS
10.0000 mg | ORAL_TABLET | Freq: Every day | ORAL | Status: DC
  Administered 2016-06-11 – 2016-06-17 (×7): 10 mg via ORAL
  Filled 2016-06-11 (×7): qty 1

## 2016-06-11 MED ORDER — ALBUTEROL SULFATE HFA 108 (90 BASE) MCG/ACT IN AERS: 2.0000 | INHALATION_SPRAY | Freq: Four times a day (QID) | RESPIRATORY_TRACT | Status: DC | PRN

## 2016-06-11 MED ORDER — NEOMYCIN SULFATE 500 MG PO TABS
500.0000 mg | ORAL_TABLET | Freq: Three times a day (TID) | ORAL | Status: AC
  Administered 2016-06-11 (×2): 500 mg via ORAL
  Filled 2016-06-11 (×2): qty 1

## 2016-06-11 MED ORDER — VENLAFAXINE HCL 75 MG PO TABS
75.0000 mg | ORAL_TABLET | Freq: Every day | ORAL | Status: DC
  Administered 2016-06-13 – 2016-06-18 (×6): 75 mg via ORAL
  Filled 2016-06-11 (×7): qty 1

## 2016-06-11 MED ORDER — ACETAMINOPHEN 325 MG PO TABS
650.0000 mg | ORAL_TABLET | ORAL | Status: DC | PRN
  Administered 2016-06-11: 650 mg via ORAL
  Filled 2016-06-11: qty 2

## 2016-06-11 MED ORDER — SODIUM CHLORIDE 0.9 % IV SOLN: Freq: Once | INTRAVENOUS | Status: DC

## 2016-06-11 NOTE — Consult Note (Signed)
[facility] Nurse ostomy consult note [facility] Nurse requested for preoperative stoma site marking per Dr. Zettie Pho request.  Discussed surgical procedure and stoma creation with patient.  Explained role of the [place] nurse team.  Answered patient questions.   Examined patient lying, sitting, and standing in order to place the marking in the patient's visual field, away from any creases or abdominal contour issues and within the rectus muscle.    Marked for ileal conduit in the RUQ  6.5 cm to the right of the umbilicus and  3.2RJ above the umbilicus.  Patient's abdomen cleansed with CHG wipes at site markings, allowed to air dry prior to marking.Covered mark with thin film transparent dressing to preserve mark until date of surgery.   [place] nursing team will follow, and will remain available to this patient, the nursing and medical teams.  Thanks, Maudie Flakes, MSN, RN, [place], Arther Abbott  Pager# [phone]

## 2016-06-11 NOTE — H&P (Signed)
Adrian Rios is an 76 y.o. male.    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 CT with numerous parasitic vessels towards left diverticulum / tumor area from internal illiacs but no adenopathy or obvious extravesical disease. Tumor NOT completely resectable endoscopically and is multifocal. CMP normal, Cr <1.   2 - Bilateral NON-Complex Renal Cysts - left peripelvic cysts w/o hydro or mass effect and right <1cm cortical cysts x 2 on hematuria CT 04/2016. No mass effect / enhancing nodules / or coarse calcifications.   PMH sig for appy, TNA. NO ischemic CV disease. His PCP is Celso Amy MD.   Today " Adrian Rios " is seen as preadmission for labs, bowel prep, stomal marking prior to surgery tomorrow.    Past Medical History:  Diagnosis Date  . Allergic rhinitis   . Arthritis   . Benign localized prostatic hyperplasia with lower urinary tract symptoms (LUTS)   . Bladder tumor   . Borderline glaucoma of right eye   . Carotid bruit    per duplex 03-11-2016 RICA 1-39%  . Chronic throat clearing   . DDD (degenerative disc disease), lumbar   . Depression   . ED (erectile dysfunction)   . Elevated PSA    urologist-  dr [name]--- s/p  prostate bx's  . GERD (gastroesophageal reflux disease)   . Hematuria   . History of chronic bronchitis   . 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 History of squamous cell carcinoma excision   . History of squamous cell carcinoma excision    several times  . Hyperlipidemia   . Pre-diabetes   . Premature ventricular contractions (PVCs) (VPCs)   . RAD (reactive airway disease)   . 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  Sensorineural hearing loss (SNHL) of both ears   . Wears glasses   . Wears hearing aid    bilateral    Past Surgical History:  Procedure Laterality Date  . APPENDECTOMY  2008  . CARDIOVASCULAR STRESS TEST  05/27/2010   normal nuclear study w/ no ischemia/  normal LV function and wall motion , ef 60%  . DEEP NECK LYMPH NODE BIOPSY / EXCISION  1970's   and Bronchoscopy w/ bx's ( dx Sarcoidosis)  . MOHS SURGERY  2013 approx.    nasal; pre melanoma  . PARS PLANA VITRECTOMY Right 2007   repair macular pucker  . TONSILLECTOMY AND ADENOIDECTOMY  child  . TRANSURETHRAL RESECTION OF BLADDER TUMOR N/A 04/06/2016   Procedure: CYSTOSCOPY TRANSURETHRAL RESECTION OF BLADDER TUMORS (TURBT);  Surgeon: Carolan Clines, MD;  Location: [facility];  Service: Urology;  Laterality: N/A;  . TRANSURETHRAL RESECTION OF PROSTATE      Family History  Problem Relation Age of Onset  . Stroke Mother     in her 70s  . Breast cancer Sister   . Heart disease Maternal Grandmother 84    MI   . Breast cancer Paternal Grandmother   . Heart disease Paternal Grandfather 5    MI  . Stroke Brother   . Diabetes Neg Hx    Social History:  reports that he has never smoked. He has never used smokeless tobacco. He reports that he does not drink alcohol or use drugs.  Allergies:  Allergies  Allergen Reactions  . Ivp Dye [Iodinated Diagnostic Agents] Shortness Of Breath, Itching and Palpitations    "eyes itching,  Heart racing,  Effected breathing"  . Shellfish Allergy Hives, Shortness Of Breath and Itching    Mostly crab  . Gabapentin Other (See Comments)    REACTION: dizziness and flushing  . Niacin-Lovastatin Er Other (See Comments)    Did not feel good on medication  . Adhesive [Tape] Rash    Use paper tape    Medications Prior to Admission  Medication Sig Dispense Refill  . albuterol (PROVENTIL HFA;VENTOLIN HFA) 108 (90 Base) MCG/ACT inhaler Inhale 2 puffs into the lungs every 6 (six)  hours as needed for wheezing or shortness of breath. 1 Inhaler 8  . finasteride (PROSCAR) 5 MG tablet Take 5 mg by mouth every morning.     . Fluticasone-Salmeterol (ADVAIR DISKUS) 250-50 MCG/DOSE AEPB INHALE ONE DOSE BY MOUTH EVERY 12 HOURS 60 each 1  . magnesium oxide (MAG-OX) 400 MG tablet Take 400 mg by mouth daily.    . Misc Natural Products (GLUCOSAMINE CHOND DOUBLE STR PO) Take 1 tablet by mouth 2 (two) times daily.    . montelukast (SINGULAIR) 10 MG tablet Take 1 tablet (10 mg total) by mouth daily. (Patient taking differently: Take 10 mg by mouth at bedtime. ) 90 tablet 0  . ranitidine (ZANTAC) 150 MG tablet Take 1 tablet (150 mg total) by mouth at bedtime. 90 tablet 1  . tamsulosin (FLOMAX) 0.4 MG CAPS capsule Take 0.4 mg by mouth 2 (two) times daily.     Marland Kitchen triamcinolone (NASACORT ALLERGY 24HR) 55 MCG/ACT AERO nasal inhaler Place 1 spray into the nose at bedtime.    Marland Kitchen venlafaxine (EFFEXOR) 75 MG tablet TAKE ONE TABLET BY MOUTH ONCE DAILY 90 tablet 1    No results found for this or any previous visit (from the past 48 hour(s)). No results found.  Review of Systems  Constitutional: Negative.  Negative for chills and fever.  HENT: Negative.   Eyes: Negative.   Respiratory: Negative.   Cardiovascular: Negative.   Gastrointestinal: Negative.   Genitourinary: Negative.   Musculoskeletal: Negative.   Skin: Negative.   Neurological: Negative.   Endo/Heme/Allergies: Negative.   Psychiatric/Behavioral: Negative.     Blood pressure 119/77, pulse 82, temperature 98.4 F (36.9 C), temperature source Oral, resp. rate 16, SpO2 95 %. Physical Exam  Constitutional: He is oriented to person, place, and time. He appears well-developed.  Eyes: Pupils are equal, round, and reactive to light.  Neck: Normal range of motion.  Cardiovascular: Normal rate.   Respiratory: Effort normal.  GI: Soft.  Genitourinary:  Genitourinary Comments: No CVAT  Musculoskeletal: Normal range of motion.   Neurological: He is alert and oriented to person, place, and time.  Skin: Skin is warm.  Psychiatric: He has a normal mood and affect.     Assessment/Plan  Proceed as planned with cysto/ICG and cystoprostatectomy tomorrow. Risks, benefits, expected peri-op course (7 day hospitalization, 12 week recovery, need for [facility] post-op for new ostomy teaching) of cystoprostatectomy with ileal conduit diversion discussed in detail.

## 2016-06-11 NOTE — Care Management Note (Signed)
Case Management Note  Patient Details  Name: Adrian Rios MRN: [id number] Date of Birth: May 11, 1940  Subjective/Objective:  76 y/o m admitted w/Bladder Ca. From home.                  Action/Plan:d/c plan home.   Expected Discharge Date:   (unknown)               Expected Discharge Plan:  Home/Self Care  In-House Referral:     Discharge planning Services  CM Consult  Post Acute Care Choice:    Choice offered to:     DME Arranged:    DME Agency:     HH Arranged:    HH Agency:     Status of Service:  In process, will continue to follow  If discussed at Long Length of Stay Meetings, dates discussed:    Additional Comments:  Dessa Phi, RN 06/11/2016, 11:49 AM

## 2016-06-12 ENCOUNTER — Inpatient Hospital Stay (HOSPITAL_COMMUNITY): Payer: Medicare Other | Admitting: Anesthesiology

## 2016-06-12 ENCOUNTER — Encounter (HOSPITAL_COMMUNITY)
Admission: RE | Disposition: A | Discharge status: Home Health | Payer: Self-pay | Source: Ambulatory Visit | Attending: Urology

## 2016-06-12 DIAGNOSIS — Z936 Other artificial openings of urinary tract status: Secondary | ICD-10-CM

## 2016-06-12 HISTORY — PX: CYSTOSCOPY WITH INJECTION: SHX1424

## 2016-06-12 LAB — HEMOGLOBIN AND HEMATOCRIT, BLOOD
HCT: 39.8 % (ref 39.0–52.0)
HEMOGLOBIN: 13.4 g/dL (ref 13.0–17.0)

## 2016-06-12 SURGERY — ROBOT ASSISTED LAPAROSCOPIC RADICAL CYSTOPROSTATECTOMY BILATERAL PELVIC LYMPHADENECTOMY,ORTHOTOPIC NEOBLADDER
Anesthesia: General

## 2016-06-12 MED ORDER — LACTATED RINGERS IV SOLN
INTRAVENOUS | Status: DC | PRN
  Administered 2016-06-12: 07:00:00 via INTRAVENOUS

## 2016-06-12 MED ORDER — FENTANYL CITRATE (PF) 100 MCG/2ML IJ SOLN
INTRAMUSCULAR | Status: DC | PRN
  Administered 2016-06-12 (×2): 50 ug via INTRAVENOUS
  Administered 2016-06-12 (×2): 100 ug via INTRAVENOUS
  Administered 2016-06-12: 50 ug via INTRAVENOUS
  Administered 2016-06-12: 100 ug via INTRAVENOUS
  Administered 2016-06-12: 50 ug via INTRAVENOUS

## 2016-06-12 MED ORDER — ROCURONIUM BROMIDE 50 MG/5ML IV SOSY
PREFILLED_SYRINGE | INTRAVENOUS | Status: AC
  Filled 2016-06-12: qty 5

## 2016-06-12 MED ORDER — PROPOFOL 10 MG/ML IV BOLUS
INTRAVENOUS | Status: AC
  Filled 2016-06-12: qty 20

## 2016-06-12 MED ORDER — SUGAMMADEX SODIUM 200 MG/2ML IV SOLN
INTRAVENOUS | Status: AC
  Filled 2016-06-12: qty 2

## 2016-06-12 MED ORDER — PIPERACILLIN-TAZOBACTAM 3.375 G IVPB
3.3750 g | Freq: Three times a day (TID) | INTRAVENOUS | Status: AC
  Administered 2016-06-12 – 2016-06-15 (×9): 3.375 g via INTRAVENOUS
  Filled 2016-06-12 (×10): qty 50

## 2016-06-12 MED ORDER — LIDOCAINE HCL (CARDIAC) 20 MG/ML IV SOLN
INTRAVENOUS | Status: DC | PRN
  Administered 2016-06-12: 50 mg via INTRAVENOUS

## 2016-06-12 MED ORDER — PROPOFOL 10 MG/ML IV BOLUS
INTRAVENOUS | Status: DC | PRN
  Administered 2016-06-12: 120 mg via INTRAVENOUS
  Administered 2016-06-12: 30 mg via INTRAVENOUS

## 2016-06-12 MED ORDER — MIDAZOLAM HCL 2 MG/2ML IJ SOLN
INTRAMUSCULAR | Status: AC
  Filled 2016-06-12: qty 2

## 2016-06-12 MED ORDER — SODIUM CHLORIDE 0.9% FLUSH: 9.0000 mL | INTRAVENOUS | Status: DC | PRN

## 2016-06-12 MED ORDER — FENTANYL CITRATE (PF) 100 MCG/2ML IJ SOLN
INTRAMUSCULAR | Status: AC
  Filled 2016-06-12: qty 2

## 2016-06-12 MED ORDER — METOPROLOL TARTRATE 25 MG PO TABS
12.5000 mg | ORAL_TABLET | Freq: Two times a day (BID) | ORAL | Status: DC
  Administered 2016-06-12 – 2016-06-18 (×12): 12.5 mg via ORAL
  Filled 2016-06-12 (×12): qty 1

## 2016-06-12 MED ORDER — ONDANSETRON HCL 4 MG/2ML IJ SOLN
INTRAMUSCULAR | Status: DC | PRN
  Administered 2016-06-12: 4 mg via INTRAVENOUS

## 2016-06-12 MED ORDER — SODIUM CHLORIDE 0.9 % IJ SOLN
INTRAMUSCULAR | Status: AC
  Filled 2016-06-12: qty 50

## 2016-06-12 MED ORDER — PROMETHAZINE HCL 25 MG/ML IJ SOLN
12.5000 mg | Freq: Four times a day (QID) | INTRAMUSCULAR | Status: DC | PRN
  Administered 2016-06-12: 12.5 mg via INTRAVENOUS
  Filled 2016-06-12: qty 1

## 2016-06-12 MED ORDER — LACTATED RINGERS IR SOLN
Status: DC | PRN
  Administered 2016-06-12: 1000 mL

## 2016-06-12 MED ORDER — BUPIVACAINE LIPOSOME 1.3 % IJ SUSP
20.0000 mL | Freq: Once | INTRAMUSCULAR | Status: AC
  Administered 2016-06-12: 20 mL
  Filled 2016-06-12: qty 20

## 2016-06-12 MED ORDER — GLYCOPYRROLATE 0.2 MG/ML IV SOSY
PREFILLED_SYRINGE | INTRAVENOUS | Status: AC
  Filled 2016-06-12: qty 5

## 2016-06-12 MED ORDER — DEXTROSE-NACL 5-0.45 % IV SOLN
INTRAVENOUS | Status: DC
  Administered 2016-06-12 – 2016-06-16 (×7): via INTRAVENOUS

## 2016-06-12 MED ORDER — LIDOCAINE 2% (20 MG/ML) 5 ML SYRINGE
INTRAMUSCULAR | Status: AC
  Filled 2016-06-12: qty 5

## 2016-06-12 MED ORDER — FENTANYL CITRATE (PF) 250 MCG/5ML IJ SOLN
INTRAMUSCULAR | Status: AC
  Filled 2016-06-12: qty 5

## 2016-06-12 MED ORDER — ALVIMOPAN 12 MG PO CAPS
12.0000 mg | ORAL_CAPSULE | Freq: Two times a day (BID) | ORAL | Status: DC
  Administered 2016-06-13 – 2016-06-17 (×9): 12 mg via ORAL
  Filled 2016-06-12 (×10): qty 1

## 2016-06-12 MED ORDER — OXYCODONE HCL 5 MG PO TABS
5.0000 mg | ORAL_TABLET | ORAL | Status: DC | PRN
  Administered 2016-06-14 – 2016-06-18 (×9): 5 mg via ORAL
  Filled 2016-06-12 (×10): qty 1

## 2016-06-12 MED ORDER — ONDANSETRON HCL 4 MG/2ML IJ SOLN
4.0000 mg | Freq: Four times a day (QID) | INTRAMUSCULAR | Status: DC | PRN
  Administered 2016-06-12 – 2016-06-18 (×3): 4 mg via INTRAVENOUS
  Filled 2016-06-12 (×3): qty 2

## 2016-06-12 MED ORDER — ESMOLOL HCL 100 MG/10ML IV SOLN
INTRAVENOUS | Status: AC
  Filled 2016-06-12: qty 10

## 2016-06-12 MED ORDER — PIPERACILLIN-TAZOBACTAM 3.375 G IVPB 30 MIN
3.3750 g | INTRAVENOUS | Status: AC
  Administered 2016-06-12: 3.375 g via INTRAVENOUS
  Filled 2016-06-12: qty 50

## 2016-06-12 MED ORDER — STERILE WATER FOR IRRIGATION IR SOLN
Status: DC | PRN
  Administered 2016-06-12: 100 mL

## 2016-06-12 MED ORDER — LACTATED RINGERS IV SOLN
INTRAVENOUS | Status: DC | PRN
  Administered 2016-06-12 (×2): via INTRAVENOUS

## 2016-06-12 MED ORDER — DIPHENHYDRAMINE HCL 50 MG/ML IJ SOLN: 12.5000 mg | Freq: Four times a day (QID) | INTRAMUSCULAR | Status: DC | PRN

## 2016-06-12 MED ORDER — HYDROMORPHONE 1 MG/ML IV SOLN
INTRAVENOUS | Status: AC
  Filled 2016-06-12: qty 25

## 2016-06-12 MED ORDER — PIPERACILLIN-TAZOBACTAM 3.375 G IVPB
INTRAVENOUS | Status: AC
  Filled 2016-06-12: qty 50

## 2016-06-12 MED ORDER — DEXAMETHASONE SODIUM PHOSPHATE 10 MG/ML IJ SOLN
INTRAMUSCULAR | Status: AC
  Filled 2016-06-12: qty 1

## 2016-06-12 MED ORDER — SUGAMMADEX SODIUM 200 MG/2ML IV SOLN
INTRAVENOUS | Status: DC | PRN
  Administered 2016-06-12: 200 mg via INTRAVENOUS

## 2016-06-12 MED ORDER — MIDAZOLAM HCL 5 MG/5ML IJ SOLN
INTRAMUSCULAR | Status: DC | PRN
  Administered 2016-06-12 (×2): 1 mg via INTRAVENOUS

## 2016-06-12 MED ORDER — NALOXONE HCL 0.4 MG/ML IJ SOLN: 0.4000 mg | INTRAMUSCULAR | Status: DC | PRN

## 2016-06-12 MED ORDER — FENTANYL CITRATE (PF) 100 MCG/2ML IJ SOLN
25.0000 ug | INTRAMUSCULAR | Status: DC | PRN
  Administered 2016-06-12 (×3): 50 ug via INTRAVENOUS

## 2016-06-12 MED ORDER — ESMOLOL HCL 100 MG/10ML IV SOLN
INTRAVENOUS | Status: DC | PRN
  Administered 2016-06-12: 40 mg via INTRAVENOUS

## 2016-06-12 MED ORDER — HYDROMORPHONE 1 MG/ML IV SOLN
INTRAVENOUS | Status: DC
  Administered 2016-06-12: 2.3 mg via INTRAVENOUS
  Administered 2016-06-12: 14:00:00 via INTRAVENOUS
  Administered 2016-06-13: 0.3 mg via INTRAVENOUS
  Administered 2016-06-13: 1.8 mg via INTRAVENOUS
  Administered 2016-06-13: 0.3 mg via INTRAVENOUS
  Administered 2016-06-13: 1.2 mg via INTRAVENOUS
  Administered 2016-06-13: 0.9 mg via INTRAVENOUS
  Administered 2016-06-14: 0.3 mg via INTRAVENOUS
  Administered 2016-06-14: 1.5 mg via INTRAVENOUS
  Administered 2016-06-14: 1.8 mg via INTRAVENOUS
  Administered 2016-06-14: 0.6 mg via INTRAVENOUS
  Administered 2016-06-14: 0.9 mg via INTRAVENOUS
  Administered 2016-06-15: 0.6 mg via INTRAVENOUS
  Administered 2016-06-15: 0.9 mg via INTRAVENOUS
  Administered 2016-06-15: 1.2 mg via INTRAVENOUS
  Administered 2016-06-15: 0.3 mg via INTRAVENOUS
  Administered 2016-06-15: 0.6 mg via INTRAVENOUS
  Administered 2016-06-15: 18:00:00 via INTRAVENOUS
  Administered 2016-06-16 (×2): 0.6 mg via INTRAVENOUS
  Filled 2016-06-12: qty 25

## 2016-06-12 MED ORDER — DIPHENHYDRAMINE HCL 12.5 MG/5ML PO ELIX
12.5000 mg | ORAL_SOLUTION | Freq: Four times a day (QID) | ORAL | Status: DC | PRN
  Filled 2016-06-12: qty 5

## 2016-06-12 MED ORDER — GLYCOPYRROLATE 0.2 MG/ML IJ SOLN
INTRAMUSCULAR | Status: DC | PRN
  Administered 2016-06-12: .2 mg via INTRAVENOUS

## 2016-06-12 MED ORDER — ACETAMINOPHEN 500 MG PO TABS
1000.0000 mg | ORAL_TABLET | Freq: Four times a day (QID) | ORAL | Status: AC
  Administered 2016-06-12 – 2016-06-13 (×4): 1000 mg via ORAL
  Filled 2016-06-12 (×4): qty 2

## 2016-06-12 MED ORDER — ROCURONIUM BROMIDE 100 MG/10ML IV SOLN
INTRAVENOUS | Status: DC | PRN
  Administered 2016-06-12: 10 mg via INTRAVENOUS
  Administered 2016-06-12: 50 mg via INTRAVENOUS
  Administered 2016-06-12 (×2): 20 mg via INTRAVENOUS
  Administered 2016-06-12: 30 mg via INTRAVENOUS
  Administered 2016-06-12: 10 mg via INTRAVENOUS
  Administered 2016-06-12: 20 mg via INTRAVENOUS

## 2016-06-12 MED ORDER — DEXAMETHASONE SODIUM PHOSPHATE 10 MG/ML IJ SOLN
INTRAMUSCULAR | Status: DC | PRN
Start: 2016-06-12 — End: 2016-06-12
  Administered 2016-06-12: 10 mg via INTRAVENOUS

## 2016-06-12 MED ORDER — ONDANSETRON HCL 4 MG/2ML IJ SOLN
INTRAMUSCULAR | Status: AC
  Filled 2016-06-12: qty 2

## 2016-06-12 SURGICAL SUPPLY — 97 items
ADH SKN CLS APL DERMABOND .7 (GAUZE/BANDAGES/DRESSINGS) ×2
AGENT HMST KT MTR STRL THRMB (HEMOSTASIS)
APL ESCP 34 STRL LF DISP (HEMOSTASIS)
APPLICATOR COTTON TIP 6IN STRL (MISCELLANEOUS) ×3 IMPLANT
APPLICATOR SURGIFLO ENDO (HEMOSTASIS) IMPLANT
BAG LAPAROSCOPIC 12 15 PORT 16 (BASKET) ×1 IMPLANT
BAG RETRIEVAL 12/15 (BASKET) ×2
BAG RETRIEVAL 12/15MM (BASKET) ×1
BAG URO CATCHER STRL LF (MISCELLANEOUS) ×1 IMPLANT
BLADE SURG SZ10 CARB STEEL (BLADE) IMPLANT
CATH FOLEY 2WAY SLVR 18FR 30CC (CATHETERS) ×3 IMPLANT
CELLS DAT CNTRL 66122 CELL SVR (MISCELLANEOUS) ×1 IMPLANT
CHLORAPREP W/TINT 26ML (MISCELLANEOUS) ×3 IMPLANT
CLIP LIGATING HEM O LOK PURPLE (MISCELLANEOUS) ×6 IMPLANT
CLIP LIGATING HEMO LOK XL GOLD (MISCELLANEOUS) ×5 IMPLANT
CLIP LIGATING HEMO O LOK GREEN (MISCELLANEOUS) ×3 IMPLANT
CLOTH BEACON ORANGE TIMEOUT ST (SAFETY) ×3 IMPLANT
CONT SPEC 4OZ CLIKSEAL STRL BL (MISCELLANEOUS) ×2 IMPLANT
COVER SURGICAL LIGHT HANDLE (MISCELLANEOUS) ×3 IMPLANT
COVER TIP SHEARS 8 DVNC (MISCELLANEOUS) ×1 IMPLANT
COVER TIP SHEARS 8MM DA VINCI (MISCELLANEOUS) ×2
DECANTER SPIKE VIAL GLASS SM (MISCELLANEOUS) ×3 IMPLANT
DERMABOND ADVANCED (GAUZE/BANDAGES/DRESSINGS) ×4
DERMABOND ADVANCED .7 DNX12 (GAUZE/BANDAGES/DRESSINGS) ×2 IMPLANT
DRAIN PENROSE 18X1/2 LTX STRL (DRAIN) IMPLANT
DRAPE ARM DVNC X/XI (DISPOSABLE) ×4 IMPLANT
DRAPE COLUMN DVNC XI (DISPOSABLE) ×1 IMPLANT
DRAPE DA VINCI XI ARM (DISPOSABLE) ×8
DRAPE DA VINCI XI COLUMN (DISPOSABLE) ×2
ELECT CAUTERY BLADE 6.4 (BLADE) ×1 IMPLANT
ELECT REM PT RETURN 15FT ADLT (MISCELLANEOUS) ×3 IMPLANT
GLOVE BIO SURGEON STRL SZ 6.5 (GLOVE) ×4 IMPLANT
GLOVE BIO SURGEONS STRL SZ 6.5 (GLOVE) ×2
GLOVE BIOGEL M STRL SZ7.5 (GLOVE) ×7 IMPLANT
GOWN STRL REUS W/TWL LRG LVL3 (GOWN DISPOSABLE) ×15 IMPLANT
GOWN STRL REUS W/TWL XL LVL3 (GOWN DISPOSABLE) ×1 IMPLANT
IRRIG SUCT STRYKERFLOW 2 WTIP (MISCELLANEOUS) ×3
IRRIGATION SUCT STRKRFLW 2 WTP (MISCELLANEOUS) ×1 IMPLANT
KIT PROCEDURE DA VINCI SI (MISCELLANEOUS) ×2
KIT PROCEDURE DVNC SI (MISCELLANEOUS) IMPLANT
LIQUID BAND (GAUZE/BANDAGES/DRESSINGS) ×2 IMPLANT
LOOP MINI RED (MISCELLANEOUS) IMPLANT
LOOP VESSEL MAXI BLUE (MISCELLANEOUS) ×3 IMPLANT
MANIFOLD NEPTUNE II (INSTRUMENTS) ×3 IMPLANT
NDL INSUFFLATION 14GA 120MM (NEEDLE) ×1 IMPLANT
NEEDLE INSUFFLATION 14GA 120MM (NEEDLE) ×3 IMPLANT
PACK CYSTO (CUSTOM PROCEDURE TRAY) ×1 IMPLANT
PACK ROBOT UROLOGY CUSTOM (CUSTOM PROCEDURE TRAY) ×3 IMPLANT
PAD POSITIONING PINK XL (MISCELLANEOUS) ×3 IMPLANT
PORT ACCESS TROCAR AIRSEAL 12 (TROCAR) ×1 IMPLANT
PORT ACCESS TROCAR AIRSEAL 5M (TROCAR) ×2
RELOAD STAPLE 60 2.6 WHT THN (STAPLE) ×2 IMPLANT
RELOAD STAPLE 60 4.1 GRN THCK (STAPLE) ×1 IMPLANT
RELOAD STAPLER GREEN 60MM (STAPLE) ×5 IMPLANT
RELOAD STAPLER WHITE 60MM (STAPLE) ×9 IMPLANT
RETRACTOR LONRSTAR 16.6X16.6CM (MISCELLANEOUS) IMPLANT
RETRACTOR STAY HOOK 5MM (MISCELLANEOUS) IMPLANT
RETRACTOR STER APS 16.6X16.6CM (MISCELLANEOUS)
RETRACTOR WND ALEXIS 18 MED (MISCELLANEOUS) ×1 IMPLANT
RTRCTR WOUND ALEXIS 18CM MED (MISCELLANEOUS) ×3
SEAL CANN UNIV 5-8 DVNC XI (MISCELLANEOUS) ×4 IMPLANT
SEAL XI 5MM-8MM UNIVERSAL (MISCELLANEOUS) ×8
SOLUTION ELECTROLUBE (MISCELLANEOUS) ×3 IMPLANT
SPONGE LAP 18X18 X RAY DECT (DISPOSABLE) ×6 IMPLANT
SPONGE LAP 4X18 X RAY DECT (DISPOSABLE) ×3 IMPLANT
STAPLER ECHELON LONG 60 440 (INSTRUMENTS) ×3 IMPLANT
STAPLER RELOAD GREEN 60MM (STAPLE) ×15
STAPLER RELOAD WHITE 60MM (STAPLE) ×27
STENT SET URETHERAL LEFT 7FR (STENTS) ×3 IMPLANT
STENT SET URETHERAL RIGHT 7FR (STENTS) ×3 IMPLANT
SURGIFLO W/THROMBIN 8M KIT (HEMOSTASIS) IMPLANT
SUT CHROMIC 4 0 RB 1X27 (SUTURE) ×3 IMPLANT
SUT ETHILON 3 0 PS 1 (SUTURE) ×3 IMPLANT
SUT MNCRL AB 4-0 PS2 18 (SUTURE) ×4 IMPLANT
SUT PDS AB 0 CTX 36 PDP370T (SUTURE) ×9 IMPLANT
SUT SILK 3 0 SH 30 (SUTURE) IMPLANT
SUT SILK 3 0 SH CR/8 (SUTURE) ×3 IMPLANT
SUT VIC AB 2-0 SH 18 (SUTURE) IMPLANT
SUT VIC AB 2-0 UR5 27 (SUTURE) ×12 IMPLANT
SUT VIC AB 2-0 UR6 27 (SUTURE) ×2 IMPLANT
SUT VIC AB 3-0 SH 27 (SUTURE) ×9
SUT VIC AB 3-0 SH 27X BRD (SUTURE) ×1 IMPLANT
SUT VIC AB 3-0 SH 27XBRD (SUTURE) ×1 IMPLANT
SUT VIC AB 4-0 RB1 27 (SUTURE) ×12
SUT VIC AB 4-0 RB1 27XBRD (SUTURE) ×4 IMPLANT
SUT VICRYL 0 UR6 27IN ABS (SUTURE) ×2 IMPLANT
SUT VLOC BARB 180 ABS3/0GR12 (SUTURE) ×3
SUTURE VLOC BRB 180 ABS3/0GR12 (SUTURE) ×1 IMPLANT
SYR CONTROL 10ML LL (SYRINGE) IMPLANT
SYSTEM UROSTOMY GENTLE TOUCH (WOUND CARE) ×3 IMPLANT
TOWEL OR NON WOVEN STRL DISP B (DISPOSABLE) ×3 IMPLANT
TROCAR BLADELESS 15MM (ENDOMECHANICALS) ×3 IMPLANT
TUBING CONNECTING 10 (TUBING) IMPLANT
TUBING CONNECTING 10' (TUBING)
WATER STERILE IRR 1000ML POUR (IV SOLUTION) ×2 IMPLANT
WATER STERILE IRR 1500ML POUR (IV SOLUTION) ×2 IMPLANT
YANKAUER SUCT BULB TIP 10FT TU (MISCELLANEOUS) ×2 IMPLANT

## 2016-06-12 NOTE — Anesthesia Procedure Notes (Signed)
Procedure Name: Intubation Date/Time: 06/12/2016 7:44 AM Performed by: Glory Buff Pre-anesthesia Checklist: Patient identified, Emergency Drugs available, Suction available and Patient being monitored Patient Re-evaluated:Patient Re-evaluated prior to inductionOxygen Delivery Method: Circle system utilized Preoxygenation: Pre-oxygenation with 100% oxygen Intubation Type: IV induction Ventilation: Mask ventilation without difficulty Laryngoscope Size: Miller and 3 Grade View: Grade I Tube type: Oral Tube size: 7.5 mm Number of attempts: 1 Airway Equipment and Method: Stylet and Oral airway Placement Confirmation: ETT inserted through vocal cords under direct vision,  positive ETCO2 and breath sounds checked- equal and bilateral Secured at: 21 cm Tube secured with: Tape Dental Injury: Teeth and Oropharynx as per pre-operative assessment

## 2016-06-12 NOTE — Anesthesia Postprocedure Evaluation (Signed)
Anesthesia Post Note  Patient: Adrian Rios  Procedure(s) Performed: Procedure(s) (LRB): ROBOT ASSISTED LAPAROSCOPIC RADICAL CYSTOPROSTATECTOMY BILATERAL PELVIC LYMPHADENECTOMY,ILEAL CONDUIT (N/A) CYSTOSCOPY WITH INJECTION OF INDOCYANINE Alzena Gerber DYE (N/A)  Patient location during evaluation: PACU Anesthesia Type: General Level of consciousness: awake Pain management: pain level controlled Vital Signs Assessment: post-procedure vital signs reviewed and stable Respiratory status: spontaneous breathing Cardiovascular status: stable Anesthetic complications: no       Last Vitals:  Vitals:   06/12/16 1427 06/12/16 1440  BP: 130/80 129/83  Pulse: (!) 101 (!) 101  Resp: 15 (!) 25  Temp:  37.2 C    Last Pain:  Vitals:   06/12/16 1459  TempSrc:   PainSc: Asleep                 Shakayla Hickox

## 2016-06-12 NOTE — Anesthesia Preprocedure Evaluation (Addendum)
Anesthesia Evaluation  Patient identified by MRN, date of birth, ID band Patient awake    Reviewed: Allergy & Precautions, NPO status , Patient's Chart, lab work & pertinent test results  Airway Mallampati: II  TM Distance: >3 FB     Dental   Pulmonary neg pulmonary ROS,    breath sounds clear to auscultation       Cardiovascular + Peripheral Vascular Disease   Rhythm:Regular Rate:Normal     Neuro/Psych    GI/Hepatic Neg liver ROS, GERD  ,  Endo/Other  negative endocrine ROS  Renal/GU      Musculoskeletal  (+) Arthritis ,   Abdominal   Peds  Hematology  (+) anemia ,   Anesthesia Other Findings   Reproductive/Obstetrics                             Anesthesia Physical Anesthesia Plan  ASA: III  Anesthesia Plan: General   Post-op Pain Management:    Induction: Intravenous  Airway Management Planned: Oral ETT  Additional Equipment:   Intra-op Plan:   Post-operative Plan: Possible Post-op intubation/ventilation  Informed Consent: I have reviewed the patients History and Physical, chart, labs and discussed the procedure including the risks, benefits and alternatives for the proposed anesthesia with the patient or authorized representative who has indicated his/her understanding and acceptance.   Dental advisory given  Plan Discussed with: CRNA and Anesthesiologist  Anesthesia Plan Comments:         Anesthesia Quick Evaluation

## 2016-06-12 NOTE — Transfer of Care (Signed)
Immediate Anesthesia Transfer of Care Note  Patient: Adrian Rios  Procedure(s) Performed: Procedure(s): ROBOT ASSISTED LAPAROSCOPIC RADICAL CYSTOPROSTATECTOMY BILATERAL PELVIC LYMPHADENECTOMY,ILEAL CONDUIT (N/A) CYSTOSCOPY WITH INJECTION OF INDOCYANINE GREEN DYE (N/A)  Patient Location: PACU  Anesthesia Type:General  Level of Consciousness: awake, alert  and oriented  Airway & Oxygen Therapy: Patient Spontanous Breathing and Patient connected to face mask oxygen  Post-op Assessment: Report given to RN and Post -op Vital signs reviewed and stable  Post vital signs: Reviewed and stable  Last Vitals:  Vitals:   06/11/16 1012 06/12/16 0615  BP: 119/77 122/72  Pulse: 82 81  Resp: 16 16  Temp: 36.9 C 36.8 C    Last Pain:  Vitals:   06/12/16 0615  TempSrc: Oral  PainSc:       Patients Stated Pain Goal: 2 (38/38/18 4037)  Complications: No apparent anesthesia complications

## 2016-06-12 NOTE — Op Note (Signed)
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  PROCEDURES: 1. Cystoscopy with injection of indocyanine green dye. 2. Robotic-assisted laparoscopic radical cystectomy. 3. Robotic-assisted laparoscopic radical prostatectomy. 4. Bilateral pelvic lymphadenectomy, Robotic. 5. Open ileal conduit urinary diversion.  ESTIMATED BLOOD LOSS:  100 mL.  COMPLICATIONS:  None.  SPECIMEN: 1. Right distal ureteral margin negative for frozen section for     carcinoma. 2. Left distal ureteral margin negative for frozen section for     carcinoma. 3. Final right distal ureteral margin. 4. Final left distal ureteral margin. 5. Right external iliac lymph nodes. 6. Right obturator lymph nodes. 7. Right common iliac lymph nodes. 8. Aortic bifurcation lymph nodes. 9. Left common iliac lymph nodes. 10.Left external iliac lymph nodes. 11.Left obturator lymph nodes. 12.Cystoprostatectomy.  FINDINGS: 1. Papillary tumor seen around the orifice and within the lumen of a     large left bladder diverticulum that was lateral to the left     ureteral orifice. 2. Excellent tattooing of area of tumor and diverticulum with     indocyanine green dye and visualization of lymphatic channels, but     no evidence of obvious sentinel lymph nodes within the pelvic lymph     node fields.  DRAINS: 1. Jackson-Pratt drain to bulb suction. 2. Urostomy to gravity drainage with Bander stents; right is red, left     is  blue.  ASSISTANT:  Adrian Alar, PA.  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  Some of it was within the bladder and a significant portion within the left bladder diverticulum that was not directly involving the left ureteral orifice.  The diverticulum has significant volume of disease with paravesical neovascularity by axial imaging, but no obvious distant disease.  This was not amenable to complete endoscopic resection.  Options were discussed for further therapy including noncurative protocols versus partial cystectomy versus radical cystectomy with the latter being the highest opportunity of cure given non-documentation of the T tumor greater disease, we have decided to forego neoadjuvant chemotherapy and proceed with upfront cystoprostatectomy, and he wished to proceed.  He was admitted preoperatively for bowel prep and stomal marking which he completed.  PROCEDURE IN DETAIL:  The patient being Adrian Rios and procedure being radical cystoprostatectomy, cysto with indocyanine green dye, ileal conduit, urinary diversion was confirmed.  Procedure was carried out. Time-out was performed.  Intravenous antibiotics were administered. General endotracheal anesthesia introduced.  The patient was placed into a medium lithotomy position.  Sterile field was created by prepping and draping the patient's penis, perineum, and proximal thighs  using iodine and in his infra-xiphoid abdomen using chlorhexidine gluconate.  After his arms were tucked to the side using foam padding and he has further fashioned to the operating table using 3-inch tape over foam padding across his supraxiphoid chest.  His test in steep Trendelenburg positioning was performed and found to be suitably positioned.  Next, a high-flow low-pressure pneumoperitoneum was obtained  using Veress technique in the supraumbilical midline having passed the aspiration and drop test.  An 8-mm robotic camera port was then placed in this location.  Laparoscopic examination of the peritoneal cavity revealed no significant adhesions and no visceral injury.  Additional ports were placed as follows; right paramedian 8-mm robotic port, right paramedian 15-mm assistant port at the site of the previously marked stoma.  This was medial to the right robotic port and finally a right far lateral 12 mm assistant port, a left paramedian 8-mm robotic port, and left far lateral 8-mm robotic port.  Robot was docked and passed through the electronic checks.  Initial attention was directed at identification of the left ureter.  Incision was made lateral to the descending colon and the posterior peritoneum in the area of the iliac crossing superiorly for distance approximately 10 cm above this, and then inferiorly just lateral to the left medial umbilical ligament towards the area of the internal ring.  The vas deferens was encountered and purposely ligated using medial pocket handle.  The left ureter was encountered as it coursed over the left iliac vessels.  It was circumferentially mobilized with a vessel loop and then dissected proximally for distance approximately 10 cm above the iliac crossing and then very carefully distally toward the area of the ureterovesical junction.  Exclusive care was taken to perform purposely wide dissection on the left lateral aspect of the bladder given the known left bladder diverticulum and the ureter was able to be dissected towards the ureterovesical junction just distal to the left superior vesical artery.  This location was doubly clipped and ligated with a proximal dyed tag suture.  The frozen section was sent, negative for carcinoma.  The left ureter was delivered out of the left hemipelvis.  Dissection proceeded laterally sweeping the left bladder  wall away from the pelvic sidewall towards the area of the endopelvic fascia which was carefully swept away from the lateral aspect of the prostate in a base-to-apex orientation.  There was significant neovascularity towards the area of the bladder neck, likely at the area of tumor and fistula.  This was verified by infrared fluorescence light which did reveal tattooing of the tumor in this location.  There was some neovascularity that appeared to be in origin from the left obturator vessels and this was very carefully controlled using vascular stapler taking exquisite care not to injure the left obturator nerve or vessels, which did not occur.  Attention was directed at left side of pelvic lymphadenectomy.  All fiber fatty tissue and confines of the left external iliac artery, vein, pelvic sidewall and iliac bifurcation was carefully mobilized.  Lymphostasis achieved with cold clips, set aside, labeled left external iliac lymph nodes.  Next, all fiber fatty tissue in the confines of left external iliac vein, pelvic sidewall, and obturator nerve was carefully mobilized.  Lymphostasis achieved with cold clips, set aside, labeled left obturator lymph nodes.  Fiber fatty tissue in the confines of the left common iliac vessels were carefully mobilized.  Lymphostasis was achieved with cold clips, set aside, labeled left obturator lymph nodes.  Notably none  of these contained hyperfluorescence lymph nodes.  Attention was then directed at the right- sided dissection.  A posterior peritoneal incision was made over the area of the iliac vessels and then inferiorly laterally just lateral to the right medial umbilical ligament towards the area of the internal ring.  The right vas deferens was encountered and purposely ligated using the bucket-handle and the posterior peritoneal incision was carried superiorly for distance approximately 8 cm above the iliac crossing lateral to the area of the cecum.   Posterior peritoneal flap was used with a bucket-handle to retract the bowel contents superomedially.  The right ureter was encountered as it coursed over the iliac vessels.  It was marked with vessel loop and dissected distally to the area of the ureterovesical junction, was doubly clipped and ligated. Tagged with a white colored proximal clip.  Frozen section, negative for carcinoma.  It was then dissected proximally for distance approximately 6 cm above the area of the ileal crossing.  It was then delivered out of the pelvis.  The right hemipelvis was inspected under near-infrared fluorescence light and again no evidence of sentinel lymph nodes were noted.  As such, template lymphadenectomy was performed on the right side as per the left, the right external iliac lymph nodes, right obturator lymph nodes, and right common iliac lymph nodes.  Exquisite care taken to avoid any injury to the obturator vessels and nerve, which did not occur.  Posterior peritoneal incision was then carried superiorly to the area of the aortic bifurcation which was easily identified and additional lymphatic tissue was taken from this location. This created an excellent posterior peritoneal window toward the left side and the left ureter was transferred to the right hemipelvis, again above the area of the aortic bifurcation and apposition to the right ureter.  Also, at this time, the ileocecal junction was identified and an area of terminal ileum 15 cm proximal to this was identified and appeared to be a suitable mesenteric mobility.  It was tagged with a silk clip on its mesentery with a non tagged clip distal to this to orient, and all 3 tag sutures were placed into a separate clip in the right lower quadrant, but above the area of the iliac vessels. Attention was directed to the posterior bladder dissection.  A posterior peritoneal flap was created by connecting the 2 previous lateral peritoneal incisions,  creating a flap inferiorly..  Dissection proceeded within this plane towards the area of the prostatic apex.  The vas seminal vesicles were noted and kept superiorly to the plane of dissection.  This exposed the vascular pedicles of the prostate and bladder bilaterally.  These were controlled using a sequential stapling technique, taking exquisite care to avoid any rectal injury which did not occur.  Finally, anterior dissection was performed by incising the between the medial umbilical ligaments and the bladder flap away from the anterior abdominal wall and towards the area of the dorsal venous complex, which was then controlled using a green load stapler.  The membranous urethra was then identified, it was transected for half of the circumference and the in-situ catheter was brought up, clipped proximally and used as a bucket-handle for the specimen as the posterior membranous sheath was then transected.  This completely freed up the cystic prostate specimen.  It was placed into an EndoCatch bag for later retrieval.  Next, the pelvis was carefully inspected.  There was no evidence of any active bleeding.  Sponge and needle counts were correct. A  digital rectal exam was performed under laparoscopic vision and no evidence of rectal violation was noted.  The specimen retrieval bag string was brought through the left paramedian robotic port site.  The left far lateral one was used to place a closed suction drain into the area of the pelvis.  The laparoscopic grasper was used to grab the previously tagged ureters and bowel tag.  The right paramedian robotic port site was then undocked.  Specimen was retrieved by extending the previous camera port site inferiorly in area on the left side of the umbilicus for distance of approximately 6 cm.  The cystoprostatectomy specimen was set aside for permanent pathology.  An Adrian type wound retractor was then deployed in this location and the tagged  ureters and bowel segment were delivered through this, and all appeared to be of suitable length and vascularity.  Attention was directed at the conduit formation.  A segment of terminal ileum 15 cm in length was then taken out of continuity using green load vascular stapler proximally and distally just distal to the previously tagged suture and orientation clips.  Mesentery was further developed with conduit segment with 1 loads of the endovascular stapler distally, 1 load proximally, taking exquisite care to avoid devascularization of the conduit or anastomotic segments, which did not occur.  The conduit segment was laid into the retroperitoneal orientation.  Attention was directed to the bowel anastomosis.  This was performed using a side-to-side fashion with 2 loads of the green load stapler.  The free end was oversewn using running silk with the second layer imbricating running silk.  The acute angle of the anastomosis was bolstered using interrupted silk.  The mesenteric defect was closed using interrupted silk.  The anastomotic segments were palpably patent and visibly vascular.  It was redelivered into the abdominal cavity.  The proximal staple line of the conduit was closed using running Vicryl.  Attention was directed to the ureteroileal anastomosis after the distal staple line was removed and the tagged clips were removed.  First on the right side, the final ureteral margin was set aside for permanent pathology.  The ureter spatulated.  An area of approximately 4 mm in length of proximal conduit was excised towards the mesenteric side as of the mucosa and 4 everting sutures were applied, everting the bowel mucosa.  A heel stitch of 4-0 Vicryl was applied and a red-colored Bander stent was advanced to 26 cm to the anastomosis without tension whatsoever and verified by efflux of copious urine.  This was anchored to the conduit using interrupted chromic through and through the  stent, and ureteroileal anastomosis was further performed using 2 separate suture lines of 4-0 Vicryl in a heel-to-toe fashion which resulted in excellent tension-free ureteroileal apposition.  A mirror image ureteral anastomosis was performed of the left ureter, again setting aside the final left ureteral margin and performing everting sutures by placing a blue-colored Bander stent at 27 cm to anastomosis on the left.  This again resulted in excellent tension- free apposition of the ureteroileal mucosa.  Omentum was brought down over the extraction site and the conduit was delivered to the previous 15 mm port site after it had been developed to accommodate 2 surgeon's fingers and 4 tagged fascial sutures placed.  These were then used to anchor the conduit to the fascia in this location x4, giving excellent length of the conduit.  It was again inspected via the extraction site and found to be in proper position without mesentery  twisting or twisting of the ureters, and the extraction site was closed at the level of the fascia using figure-of-eight PDS x6, followed by reapproximation of Scarpa's with running Vicryl and closure of the skin using subcuticular Monocryl.  The conduit was then developed using 4 everting sutures which resulted in excellent rose budding of the stoma with intervening interrupted Vicryl x3 between each rose budding suture.  All incision sites were infiltrated with dilute lyophilized Marcaine and closed at the level of the skin using subcuticular Monocryl, followed by Dermabond.  Drain stitch was applied.  Urostomy appliance was applied. Procedure terminated.  The patient tolerated the procedure well.  There were no immediate periprocedural complications.  The patient was taken to the postanesthesia care unit in stable condition.   Please note Adrian Alar PA was absolutely crucial for all robotic portions of procedure today. She provided invaluable retraction, clip  application, suctioning, vascular stapeling, and bed-side assisting without which the procedure would not be possible.          ______________________________ Adrian Frock, MD     TM/MEDQ  D:  06/12/2016  T:  06/12/2016  Job:  [id number]

## 2016-06-12 NOTE — Progress Notes (Signed)
  Subjective:  1 - Large Volume High Grade Bladder Cancer in / Around Bladder Diverticulum - very large volume multifocal high grade bladder cancer in left sided bladder diverticulum and left wall bladder by CT and TURBT 04/2016. CT with numerous parasitic vessels towards left diverticulum / tumor area from internal illiacs but no adenopathy or obvious extravesical disease. Tumor NOT completely resectable endoscopically and is multifocal. CMP normal, Cr <1.   Had stomal marking, entereg, bowel prep to clear yesterday.   Today " Adrian Rios " is stable. Completed bowel prep yesterday.   Objective: Vital signs in last 24 hours: Temp:  [98.3 F (36.8 C)-98.4 F (36.9 C)] 98.3 F (36.8 C) (04/13 0615) Pulse Rate:  [81-82] 81 (04/13 0615) Resp:  [16] 16 (04/13 0615) BP: (119-122)/(72-77) 122/72 (04/13 0615) SpO2:  [95 %-97 %] 97 % (04/13 0615) Weight:  [78.6 kg (173 lb 4.8 oz)] 78.6 kg (173 lb 4.8 oz) (04/12 1327) Last BM Date: 06/11/16  Intake/Output from previous day: 04/12 0701 - 04/13 0700 In: 9 [IV Piggyback:50] Out: -  Intake/Output this shift: Total I/O In: 50 [IV Piggyback:50] Out: -   General appearance: alert, cooperative and appears stated age Eyes: negative Nose: Nares normal. Septum midline. Mucosa normal. No drainage or sinus tenderness. Throat: lips, mucosa, and tongue normal; teeth and gums normal Neck: supple, symmetrical, trachea midline Back: symmetric, no curvature. ROM normal. No CVA tenderness. Resp: non-labored on room air.  Cardio: Nl rate GI: soft, non-tender; bowel sounds normal; no masses,  no organomegaly and RLQ stomal marking site noted.  Male genitalia: normal Extremities: extremities normal, atraumatic, no cyanosis or edema Pulses: 2+ and symmetric Skin: Skin color, texture, turgor normal. No rashes or lesions Lymph nodes: Cervical, supraclavicular, and axillary nodes normal. Neurologic: Grossly normal  Lab Results:   Recent Labs  06/11/16 1045   WBC 8.4  HGB 13.9  HCT 40.9  PLT 341   BMET  Recent Labs  06/11/16 1045  NA 139  K 4.0  CL 108  CO2 24  GLUCOSE 103*  BUN 25*  CREATININE 0.94  CALCIUM 8.9   PT/INR No results for input(s): LABPROT, INR in the last 72 hours. ABG No results for input(s): PHART, HCO3 in the last 72 hours.  Invalid input(s): PCO2, PO2  Studies/Results: No results found.  Anti-infectives: Anti-infectives    Start     Dose/Rate Route Frequency Ordered Stop   06/12/16 0600  piperacillin-tazobactam (ZOSYN) IVPB 3.375 g     3.375 g 100 mL/hr over 30 Minutes Intravenous 30 min pre-op 06/11/16 1027 06/12/16 0530   06/11/16 1400  neomycin (MYCIFRADIN) tablet 500 mg     500 mg Oral Every 8 hours 06/11/16 1100 06/11/16 2019   06/11/16 1400  metroNIDAZOLE (FLAGYL) tablet 500 mg     500 mg Oral Every 8 hours 06/11/16 1100 06/11/16 2019      Assessment/Plan:  Proceed as planned with robotic cystectomy today. Pt has good understanding of risks, benefits, expected peri-op course.   [facility], Katheren Jimmerson 06/12/2016

## 2016-06-12 NOTE — Discharge Instructions (Signed)

## 2016-06-12 NOTE — Brief Op Note (Signed)
06/11/2016 - 06/12/2016  12:31 PM  PATIENT:  Adrian Rios  76 y.o. male  PRE-OPERATIVE DIAGNOSIS:  BLADDER CANCER  POST-OPERATIVE DIAGNOSIS:  BLADDER CANCER  PROCEDURE:  Procedure(s): ROBOT ASSISTED LAPAROSCOPIC RADICAL CYSTOPROSTATECTOMY BILATERAL PELVIC LYMPHADENECTOMY,ILEAL CONDUIT (N/A) CYSTOSCOPY WITH INJECTION OF INDOCYANINE GREEN DYE (N/A)  SURGEON:  Surgeon(s) and Role:    * Alexis Frock, MD - Primary   ASSISTANTS:  Debbrah Alar PA  ANESTHESIA:   local and general  EBL:  Total I/O In: 1000 [I.V.:1000] Out: 100 [Blood:100]  BLOOD ADMINISTERED:none  DRAINS: 1 - JP to bulb, 2- Urostomy to gravity   LOCAL MEDICATIONS USED:  MARCAINE     SPECIMEN:  Source of Specimen:  1 - distal ureteral margins, 2 - bilateral pelvic lymph nodes, 3 - cystoprostatectomy  DISPOSITION OF SPECIMEN:  PATHOLOGY  COUNTS:  YES  TOURNIQUET:  * No tourniquets in log *  DICTATION: .Other Dictation: Dictation Number  no number given  PLAN OF CARE: Admit to inpatient   PATIENT DISPOSITION:  PACU - hemodynamically stable.   Delay start of Pharmacological VTE agent (>24hrs) due to surgical blood loss or risk of bleeding: yes

## 2016-06-12 NOTE — Interval H&P Note (Signed)
History and Physical Interval Note:  06/12/2016 7:03 AM  Adrian Rios  has presented today for surgery, with the diagnosis of BLADDER CANCER  The various methods of treatment have been discussed with the patient and family. After consideration of risks, benefits and other options for treatment, the patient has consented to  Procedure(s): ROBOT ASSISTED LAPAROSCOPIC RADICAL CYSTOPROSTATECTOMY BILATERAL PELVIC LYMPHADENECTOMY,ILEAL CONDUIT (N/A) CYSTOSCOPY WITH INJECTION OF INDOCYANINE GREEN DYE (N/A) as a surgical intervention .  The patient's history has been reviewed, patient examined, no change in status, stable for surgery.  I have reviewed the patient's chart and labs.  Questions were answered to the patient's satisfaction.     [name]

## 2016-06-13 LAB — BASIC METABOLIC PANEL
ANION GAP: 7 (ref 5–15)
BUN: 18 mg/dL (ref 6–20)
CALCIUM: 8 mg/dL — AB (ref 8.9–10.3)
CO2: 25 mmol/L (ref 22–32)
Chloride: 104 mmol/L (ref 101–111)
Creatinine, Ser: 1.02 mg/dL (ref 0.61–1.24)
GFR calc non Af Amer: >60 mL/min (ref >60)
Glucose, Bld: 150 mg/dL — ABNORMAL HIGH (ref 65–99)
POTASSIUM: 3.8 mmol/L (ref 3.5–5.1)
Sodium: 136 mmol/L (ref 135–145)

## 2016-06-13 LAB — HEMOGLOBIN AND HEMATOCRIT, BLOOD
HEMATOCRIT: 36.4 % — AB (ref 39.0–52.0)
Hemoglobin: 12 g/dL — ABNORMAL LOW (ref 13.0–17.0)

## 2016-06-13 MED ORDER — VITAMINS A & D EX OINT
TOPICAL_OINTMENT | CUTANEOUS | Status: AC
  Filled 2016-06-13: qty 5

## 2016-06-13 NOTE — Progress Notes (Signed)
Aline removed per order. No complications noted; no bleeding at site.

## 2016-06-13 NOTE — Progress Notes (Signed)
Patient ID: Adrian Rios, male   DOB: 1940/08/08, 76 y.o.   MRN: [id number]  1 Day Post-Op Subjective: Pt doing well overnight. Had severe nausea now improved.  Pain also now better controlled.  Objective: Vital signs in last 24 hours: Temp:  [97.9 F (36.6 C)-99.7 F (37.6 C)] 97.9 F (36.6 C) (04/14 0800) Pulse Rate:  [75-102] 83 (04/14 0923) Resp:  [12-31] 18 (04/14 0923) BP: (102-142)/(59-87) 118/60 (04/14 0916) SpO2:  [91 %-97 %] 92 % (04/14 0923) Arterial Line BP: (134-150)/(64-78) 138/65 (04/13 1440)  Intake/Output from previous day: 04/13 0701 - 04/14 0700 In: 3864.3 [P.O.:180; I.V.:3583.3; IV Piggyback:100] Out: 1670 [Urine:1375; Drains:195; Blood:100] Intake/Output this shift: No intake/output data recorded.  Physical Exam:  General: Alert and oriented CV: RRR Lungs: Clear Abdomen: Soft, ND, No BS, urostomy pink, stents in place, clear urine in bag Incisions: C/D/I with minimal ecchymosis over his midline incision,  Ext: NT, No erythema  Lab Results:  Recent Labs  06/11/16 1045 06/12/16 1405 06/13/16 0358  HGB 13.9 13.4 12.0*  HCT 40.9 39.8 36.4*   BMET  Recent Labs  06/11/16 1045 06/13/16 0358  NA 139 136  K 4.0 3.8  CL 108 104  CO2 24 25  GLUCOSE 103* 150*  BUN 25* 18  CREATININE 0.94 1.02  CALCIUM 8.9 8.0*     Studies/Results: No results found.  Assessment/Plan: POD # 1 s/p radical cystectomy and ileal conduit - Transfer to floor  - Ambulate, IS - DVT prophylaxis - Continue NPO   LOS: 2 days   Paige Monarrez,LES 06/13/2016, 11:18 AM

## 2016-06-13 NOTE — Progress Notes (Signed)
Received patient from ICU, alert and oriented, family at bedside. Ileal Conduit draining to foley bag, urine clear,left JP draining serousanguinous output, Midline incision,3 lapsites intact.

## 2016-06-14 LAB — BASIC METABOLIC PANEL
ANION GAP: 7 (ref 5–15)
BUN: 12 mg/dL (ref 6–20)
CHLORIDE: 107 mmol/L (ref 101–111)
CO2: 26 mmol/L (ref 22–32)
Calcium: 8.2 mg/dL — ABNORMAL LOW (ref 8.9–10.3)
Creatinine, Ser: 0.85 mg/dL (ref 0.61–1.24)
GFR calc Af Amer: >60 mL/min (ref >60)
Glucose, Bld: 122 mg/dL — ABNORMAL HIGH (ref 65–99)
POTASSIUM: 3.6 mmol/L (ref 3.5–5.1)
SODIUM: 140 mmol/L (ref 135–145)

## 2016-06-14 LAB — HEMOGLOBIN AND HEMATOCRIT, BLOOD
HEMATOCRIT: 37.9 % — AB (ref 39.0–52.0)
HEMOGLOBIN: 12.5 g/dL — AB (ref 13.0–17.0)

## 2016-06-14 NOTE — Progress Notes (Signed)
Patient ID: Adrian Rios, male   DOB: January 27, 1941, 76 y.o.   MRN: [id number]  2 Days Post-Op Subjective: Pt doing better.  No nausea or vomiting last 24 hrs.  Has been up in a chair but has not yet ambulated.  No flatus or BM.  Pain controlled.  Objective: Vital signs in last 24 hours: Temp:  [98 F (36.7 C)-98.7 F (37.1 C)] 98.4 F (36.9 C) (04/15 0511) Pulse Rate:  [70-77] 71 (04/15 0802) Resp:  [15-26] 16 (04/15 0845) BP: (124-128)/(70-72) 124/70 (04/15 0511) SpO2:  [94 %-96 %] 94 % (04/15 0845) FiO2 (%):  [93 %] 93 % (04/14 2043)  Intake/Output from previous day: 04/14 0701 - 04/15 0700 In: 2535 [I.V.:2385; IV Piggyback:150] Out: 5366 [Urine:3175; Drains:370] Intake/Output this shift: No intake/output data recorded.  Physical Exam:  General: Alert and oriented CV: RRR Lungs: Clear Abdomen: Soft, ND, positive BS, urostomy pink and protuberant, urine clear Incisions: C/D/I Ext: NT, No erythema  Lab Results:  Recent Labs  06/12/16 1405 06/13/16 0358 06/14/16 0454  HGB 13.4 12.0* 12.5*  HCT 39.8 36.4* 37.9*   BMET  Recent Labs  06/13/16 0358 06/14/16 0454  NA 136 140  K 3.8 3.6  CL 104 107  CO2 25 26  GLUCOSE 150* 122*  BUN 18 12  CREATININE 1.02 0.85  CALCIUM 8.0* 8.2*     Studies/Results: No results found.  Assessment/Plan: POD # 2 s/p radical cystectomy and ileal conduit - Ambulate, IS - Continue NPO/ice chips today and await return of bowel function - Monitor renal function   LOS: 3 days   Jesse Hirst,LES 06/14/2016, 9:47 AM

## 2016-06-15 ENCOUNTER — Encounter (HOSPITAL_COMMUNITY): Payer: Self-pay | Admitting: Urology

## 2016-06-15 LAB — BPAM RBC
BLOOD PRODUCT EXPIRATION DATE: 201804262359
Blood Product Expiration Date: 201804252359
UNIT TYPE AND RH: 6200
Unit Type and Rh: 6200

## 2016-06-15 LAB — TYPE AND SCREEN
ABO/RH(D): AB POS
Antibody Screen: NEGATIVE
UNIT DIVISION: 0
Unit division: 0

## 2016-06-15 LAB — HEMOGLOBIN AND HEMATOCRIT, BLOOD
HCT: 38 % — ABNORMAL LOW (ref 39.0–52.0)
HEMOGLOBIN: 12.7 g/dL — AB (ref 13.0–17.0)

## 2016-06-15 LAB — BASIC METABOLIC PANEL
ANION GAP: 7 (ref 5–15)
BUN: 10 mg/dL (ref 6–20)
CO2: 25 mmol/L (ref 22–32)
CREATININE: 0.73 mg/dL (ref 0.61–1.24)
Calcium: 8.2 mg/dL — ABNORMAL LOW (ref 8.9–10.3)
Chloride: 106 mmol/L (ref 101–111)
GFR calc Af Amer: >60 mL/min (ref >60)
Glucose, Bld: 129 mg/dL — ABNORMAL HIGH (ref 65–99)
POTASSIUM: 3.6 mmol/L (ref 3.5–5.1)
Sodium: 138 mmol/L (ref 135–145)

## 2016-06-15 MED ORDER — ACETAMINOPHEN 500 MG PO TABS
1000.0000 mg | ORAL_TABLET | Freq: Three times a day (TID) | ORAL | Status: DC
  Administered 2016-06-15 – 2016-06-18 (×10): 1000 mg via ORAL
  Filled 2016-06-15 (×10): qty 2

## 2016-06-15 NOTE — Consult Note (Signed)
[facility] Nurse ostomy consult note Stoma type/location: RUQ ileal conduit with 2 stents intact, Red = right, blue = left Stomal assessment/size: 1 and 3/8 inches round, red, moist.  Os at center Peristomal assessment: intact, clear Treatment options for stomal/peristomal skin: skin barrier ring Output: amber urine Ostomy pouching: 1pc convex urostomy pouch with skin barrier ring Education provided: Extended session with patient and wife today.  GI/GU A&P taught, including stoma and pouch characteristics.  Patient observes sizing of stoma and pouch preparation, understands heat-activation of pectin skin barrier. Observes tracing of pattern and cutting out of skin barrier.  Old pouch removed, demonstrated inspection of pouch backing to determine wear time.  Taught about mucus as a normal finding with an ileal conduit. Pouching system placement demonstrated and condensation "cloud" as assurance of pouch seal. Taught about drainage port and nighttime adapter; patient is able to give a return demonstration for applying and disconnecting the adapter to and from the pouch.  Instructed to disconnect from nighttime drainage bag when ambulating in hall.  Taught bedside drainage bag care including rinsing with water each morning and with a 50/50 water/vinegar solution once weekly. The leg strap is demonstrated and discussed for nighttime drainage bag tubing. Supplies are  Discussed and with their third party payer (Medicare) and supplemental insurance provider (BC&BS), they will have no difficulty in ordering or paying for supplies.  The rationale for the stents is discussed.  Patient is aware that he must bring a spare pouch with him to MD appointments and carry one in general.  Lifting and post operative routines discussed; patient is aware that hernia formation may occur with lifting over 20# in the post operative period without abdominal support. Patient and spouse are appreciative of visit and information.  They have my  contact information in the event of questions. HHRN has been ordered for post op reinforcement of instructions, resizing of ostomy and for maintenance of the stents for the first few weeks at home. Enrolled patient in [place] program: Yes [place] nursing team will follow ion house, and will remain available to this patient, the nursing and medical teams. Thanks, Maudie Flakes, MSN, RN, [place], Arther Abbott  Pager# [phone]  Length of encounter = 90 minutes

## 2016-06-15 NOTE — Evaluation (Signed)
Physical Therapy Evaluation Patient Details Name: Adrian Rios MRN: [id number] DOB: June 15, 1940 Today's Date: 06/15/2016   History of Present Illness  76 yo male s/p robotic cystoprostatectomy + pelvic lymphadenectomy + ileal conduit urinary diversion 06/12/16. Hx of bladder cancer  Clinical Impression  On eval, pt required Min assist +2 for safety/equipment. He walked ~500 feet while holding on to IV pole. Unsteady at times so will continue to assess need for RW. Pain rated 5/10 with activity. O2 sats >90% on RA during activity. Encouraged pt to continue ambulating with nursing. Will follow.     Follow Up Recommendations Home health PT vs No PT follow up (depending on progress);Supervision/Assistance - 24 hour     Equipment Recommendations   (continuing to assess)    Recommendations for Other Services       Precautions / Restrictions Precautions Precautions: Fall Precaution Comments: L jp drain, R urostomy Restrictions Weight Bearing Restrictions: No      Mobility  Bed Mobility Overal bed mobility: Needs Assistance Bed Mobility: Supine to Sit     Supine to sit: HOB elevated;Min assist     General bed mobility comments: Assist for trunk. Encouraged pt to try to do as much as he can unassisted.   Transfers Overall transfer level: Needs assistance Equipment used: None Transfers: Sit to/from Stand Sit to Stand: Min assist         General transfer comment: Assist to rise, stabilize, control descent. VCs hand placement. Pt tends to want to pull up on staff or have staff pull on him. Encouraged pt to try to do as much as he can unassisted.   Ambulation/Gait Ambulation/Gait assistance: Min assist;+2 safety/equipment Ambulation Distance (Feet): 500 Feet Assistive device:  (IV pole) Gait Pattern/deviations: Step-through pattern;Decreased stride length;Drifts right/left;Staggering right;Staggering left     General Gait Details: slow gait speed. Unsteady at times requiring  Min assist. O2 sats >90% on RA during ambulation.   Stairs            Wheelchair Mobility    Modified Rankin (Stroke Patients Only)       Balance                                             Pertinent Vitals/Pain Pain Assessment: 0-10 Pain Score: 5  Pain Location: abdomen with activity Pain Descriptors / Indicators: Sore Pain Intervention(s): PCA encouraged    Home Living Family/patient expects to be discharged to:: Private residence Living Arrangements: Spouse/significant other Available Help at Discharge: Family Type of Home: House Home Access: Ramped entrance     Home Layout: Two level;Able to live on main level with bedroom/bathroom Home Equipment: None      Prior Function Level of Independence: Independent               Hand Dominance        Extremity/Trunk Assessment   Upper Extremity Assessment Upper Extremity Assessment: Generalized weakness    Lower Extremity Assessment Lower Extremity Assessment: Generalized weakness    Cervical / Trunk Assessment Cervical / Trunk Assessment: Normal  Communication   Communication: No difficulties  Cognition Arousal/Alertness: Awake/alert Behavior During Therapy: WFL for tasks assessed/performed Overall Cognitive Status: Within Functional Limits for tasks assessed  General Comments      Exercises     Assessment/Plan    PT Assessment Patient needs continued PT services  PT Problem List Decreased mobility;Decreased activity tolerance;Decreased balance;Pain       PT Treatment Interventions Gait training;DME instruction;Therapeutic activities;Therapeutic exercise;Patient/family education;Balance training;Functional mobility training    PT Goals (Current goals can be found in the Care Plan section)  Acute Rehab PT Goals Patient Stated Goal: less pain. regain independence PT Goal Formulation: With patient/family Time  For Goal Achievement: 06/29/16 Potential to Achieve Goals: Good    Frequency Min 3X/week   Barriers to discharge        Co-evaluation               End of Session   Activity Tolerance: Patient tolerated treatment well Patient left: in chair;with call bell/phone within reach;with family/visitor present   PT Visit Diagnosis: Muscle weakness (generalized) (M62.81);Difficulty in walking, not elsewhere classified (R26.2)    Time: 3361-2244 PT Time Calculation (min) (ACUTE ONLY): 22 min   Charges:   PT Evaluation $PT Eval Low Complexity: 1 Procedure     PT G Codes:         Weston Anna, MPT Pager: [phone]

## 2016-06-15 NOTE — Progress Notes (Signed)
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 Stepdown POD 0, to med surg floor POD1. Path pending.  2 - Post-op Ileus -  s/p bowel anastamoses at surgery above. Received Entereg pre/post-op. Initially NPO with ice chips advanced to clears POD3.  3 - Disposition / Rehab - pt completely independent at baseline. PT eval pending. Ostomy RN following in house.  Today "Adrian Rios" is stable. Pain controlled. Lots of bowel rumbling but not flatus, no emesis.   Objective: Vital signs in last 24 hours: Temp:  [97.3 F (36.3 C)-99.4 F (37.4 C)] 99.4 F (37.4 C) (04/15 2121) Pulse Rate:  [71-74] 73 (04/15 2121) Resp:  [14-24] 22 (04/16 0457) BP: (122-124)/(68-74) 124/74 (04/15 2121) SpO2:  [2 %-96 %] 96 % (04/16 0457) Last BM Date: 06/11/16  Intake/Output from previous day: 04/15 0701 - 04/16 0700 In: 2623.3 [I.V.:2473.3; IV Piggyback:150] Out: 2180 [Urine:2050; Drains:130] Intake/Output this shift: Total I/O In: 1650 [I.V.:1550; IV Piggyback:100] Out: 900 [Urine:800; Drains:100]  General appearance: alert, cooperative, appears stated age and daughter at bedside Eyes: negative Nose: Nares normal. Septum midline. Mucosa normal. No drainage or sinus tenderness. Throat: lips, mucosa, and tongue normal; teeth and gums normal Neck: supple, symmetrical, trachea midline Back: symmetric, no curvature. ROM normal. No CVA tenderness. Resp: non-labored on minimal [place] o2 Cardio: Nl rate GI: soft, non-tender; bowel sounds normal; no masses,  no organomegaly Male genitalia: normal Extremities: extremities normal, atraumatic, no cyanosis or edema Skin: Skin color, texture, turgor normal. No rashes or lesions Lymph nodes: Cervical, supraclavicular, and axillary nodes normal. Neurologic: Grossly normal Incision/Wound:  Recent incision sites c/d/I. RLQ Urostomy with very light pink urine and bander stents in situ. JP with serous non-foul output.   Lab Results:   Recent Labs  06/14/16 0454 06/15/16 0447  HGB 12.5* 12.7*  HCT 37.9* 38.0*   BMET  Recent Labs  06/14/16 0454 06/15/16 0447  NA 140 138  K 3.6 3.6  CL 107 106  CO2 26 25  GLUCOSE 122* 129*  BUN 12 10  CREATININE 0.85 0.73  CALCIUM 8.2* 8.2*   PT/INR No results for input(s): LABPROT, INR in the last 72 hours. ABG No results for input(s): PHART, HCO3 in the last 72 hours.  Invalid input(s): PCO2, PO2  Studies/Results: No results found.  Anti-infectives: Anti-infectives    Start     Dose/Rate Route Frequency Ordered Stop   06/12/16 2000  piperacillin-tazobactam (ZOSYN) IVPB 3.375 g     3.375 g 12.5 mL/hr over 240 Minutes Intravenous Every 8 hours 06/12/16 1456 06/15/16 1959   06/12/16 1045  piperacillin-tazobactam (ZOSYN) IVPB 3.375 g     3.375 g 100 mL/hr over 30 Minutes Intravenous To Surgery 06/12/16 1027 06/12/16 1115   06/12/16 0836  piperacillin-tazobactam (ZOSYN) 3.375 GM/50ML IVPB    Comments:  [name], [name]: cabinet override      06/12/16 0836 06/12/16 1100   06/12/16 0600  piperacillin-tazobactam (ZOSYN) IVPB 3.375 g     3.375 g 100 mL/hr over 30 Minutes Intravenous 30 min pre-op 06/11/16 1027 06/12/16 0530   06/11/16 1400  neomycin (MYCIFRADIN) tablet 500 mg     500 mg Oral Every 8 hours 06/11/16 1100 06/11/16 2019   06/11/16 1400  metroNIDAZOLE (FLAGYL) tablet 500 mg     500 mg Oral Every 8 hours 06/11/16 1100 06/11/16 2019  Assessment/Plan:  1 - Bladder Cancer - doing well POD 3. Remain in house. SLIV. Ambulate. PT eval.   2 - Post-op Ileus -  Advance to cleras and IVF to 1/2 MIV today  3 - Disposition / Rehab -  PT eval to hep with needs, He will need HHRN at minimum given new ostomy  Adrian Rios 06/15/2016

## 2016-06-15 NOTE — Care Management Important Message (Signed)
Important Message  Patient Details  Name: DARRAL RISHEL MRN: [id number] Date of Birth: Apr 08, 1940   Medicare Important Message Given:  Yes    Kerin Salen 06/15/2016, 12:10 [place] Message  Patient Details  Name: TALLON GERTZ MRN: [id number] Date of Birth: 1940-04-29   Medicare Important Message Given:  Yes    Kerin Salen 06/15/2016, 12:10 PM

## 2016-06-16 MED ORDER — SENNOSIDES-DOCUSATE SODIUM 8.6-50 MG PO TABS
1.0000 | ORAL_TABLET | Freq: Two times a day (BID) | ORAL | Status: DC
  Administered 2016-06-16 – 2016-06-18 (×4): 1 via ORAL
  Filled 2016-06-16 (×4): qty 1

## 2016-06-16 MED ORDER — BISACODYL 10 MG RE SUPP: 10.0000 mg | Freq: Every day | RECTAL | Status: DC | PRN

## 2016-06-16 NOTE — Progress Notes (Signed)
Physical Therapy Treatment Patient Details Name: Adrian Rios MRN: [id number] DOB: 29-Jan-1941 Today's Date: 06/16/2016    History of Present Illness 76 yo male s/p robotic cystoprostatectomy + pelvic lymphadenectomy + ileal conduit urinary diversion 06/12/16. Hx of bladder cancer    PT Comments    Assisted OOB to amb a greater distance in hallway.  No AD.  No LOB.  Has a ramp to enter home.   Follow Up Recommendations  Home health PT;No PT follow up;Supervision/Assistance - 24 hour     Equipment Recommendations       Recommendations for Other Services       Precautions / Restrictions Precautions Precautions: Fall Precaution Comments: L jp drain, R urostomy Restrictions Weight Bearing Restrictions: No    Mobility  Bed Mobility Overal bed mobility: Needs Assistance Bed Mobility: Supine to Sit;Sit to Supine     Supine to sit: Supervision;Min guard Sit to supine: Supervision;Min assist   General bed mobility comments: assist with covers only.  pt able to perform functionally  Transfers Overall transfer level: Needs assistance Equipment used: None Transfers: Sit to/from Stand Sit to Stand: Min assist         General transfer comment: good use of hands to steady self.  one VC needed with stand to sit to reach back and copntrol decend    Ambulation/Gait Ambulation/Gait assistance: Min assist;+2 safety/equipment Ambulation Distance (Feet): 455 Feet Assistive device: None Gait Pattern/deviations: Step-through pattern Gait velocity: WFL   General Gait Details: good alternating feet and no VC's needed,  Tolerated distance well.  No pain just "tightness" ABD   Stairs            Wheelchair Mobility    Modified Rankin (Stroke Patients Only)       Balance                                            Cognition Arousal/Alertness: Awake/alert Behavior During Therapy: WFL for tasks assessed/performed Overall Cognitive Status: Within  Functional Limits for tasks assessed                                        Exercises      General Comments        Pertinent Vitals/Pain Pain Assessment: Faces Faces Pain Scale: Hurts a little bit Pain Location: abdomen with activity Pain Descriptors / Indicators: Cramping;Sore Pain Intervention(s): Monitored during session    Home Living                      Prior Function            PT Goals (current goals can now be found in the care plan section) Progress towards PT goals: Progressing toward goals    Frequency    Min 3X/week      PT Plan Current plan remains appropriate    Co-evaluation             End of Session Equipment Utilized During Treatment: Gait belt Activity Tolerance: Patient tolerated treatment well Patient left: with call bell/phone within reach;with family/visitor present   PT Visit Diagnosis: Muscle weakness (generalized) (M62.81);Difficulty in walking, not elsewhere classified (R26.2)     Time: 3086-5784 PT Time Calculation (min) (ACUTE ONLY): 14 min  Charges:  $Gait  Training: 8-22 mins                    G Codes:       Rica Koyanagi  PTA [facility]  Acute  Rehab Pager      [phone]

## 2016-06-16 NOTE — Progress Notes (Signed)
Spoke with pt's wife via telephone concerning [facility]. She selected [facility].  Referral given to in house rep.

## 2016-06-16 NOTE — Progress Notes (Signed)
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 Stepdown POD 0, to med surg floor POD1. Path pending.  2 - Post-op Ileus -  s/p bowel anastamoses at surgery above. Received Entereg pre/post-op. Initially NPO with ice chips advanced to clears POD3, Fulls POD 4 as starting to have flatus.  3 - Disposition / Rehab - pt completely independent at baseline. PT eval pending. Ostomy RN following in house. Face to face for home health completed POD4.   Today "Adrian Rios" is stable. Happier to be off IVF. Walking much more.   Objective: Vital signs in last 24 hours: Temp:  [97.6 F (36.4 C)-98.9 F (37.2 C)] 98.6 F (37 C) (04/17 1445) Pulse Rate:  [72-79] 76 (04/17 1445) Resp:  [12-20] 18 (04/17 1445) BP: (109-114)/(64-73) 112/70 (04/17 1445) SpO2:  [91 %-96 %] 95 % (04/17 1445) Last BM Date: 06/11/16  Intake/Output from previous day: 04/16 0701 - 04/17 0700 In: 1250 [I.V.:1200; IV Piggyback:50] Out: 9379 [Urine:2350; Drains:290] Intake/Output this shift: Total I/O In: -  Out: 165 [Urine:125; Drains:40]  General appearance: alert, cooperative and appears stated age Eyes: negative Nose: Nares normal. Septum midline. Mucosa normal. No drainage or sinus tenderness. Throat: lips, mucosa, and tongue normal; teeth and gums normal Neck: supple, symmetrical, trachea midline Back: symmetric, no curvature. ROM normal. No CVA tenderness. Resp: non-labored on room air.  Cardio: Nl rate GI: soft, non-tender; bowel sounds normal; no masses,  no organomegaly Male genitalia: normal Extremities: extremities normal, atraumatic, no cyanosis or edema Pulses: 2+ and symmetric Skin: Skin color, texture, turgor normal. No rashes or lesions Lymph nodes: Cervical, supraclavicular, and axillary nodes normal. Incision/Wound: RLQ  Ursotomy pink / patent with copious very light pink urine. JP with non-foul serous drainage.   Lab Results:   Recent Labs  06/14/16 0454 06/15/16 0447  HGB 12.5* 12.7*  HCT 37.9* 38.0*   BMET  Recent Labs  06/14/16 0454 06/15/16 0447  NA 140 138  K 3.6 3.6  CL 107 106  CO2 26 25  GLUCOSE 122* 129*  BUN 12 10  CREATININE 0.85 0.73  CALCIUM 8.2* 8.2*   PT/INR No results for input(s): LABPROT, INR in the last 72 hours. ABG No results for input(s): PHART, HCO3 in the last 72 hours.  Invalid input(s): PCO2, PO2  Studies/Results: No results found.  Anti-infectives: Anti-infectives    Start     Dose/Rate Route Frequency Ordered Stop   06/12/16 2000  piperacillin-tazobactam (ZOSYN) IVPB 3.375 g     3.375 g 12.5 mL/hr over 240 Minutes Intravenous Every 8 hours 06/12/16 1456 06/15/16 1726   06/12/16 1045  piperacillin-tazobactam (ZOSYN) IVPB 3.375 g     3.375 g 100 mL/hr over 30 Minutes Intravenous To Surgery 06/12/16 1027 06/12/16 1115   06/12/16 0836  piperacillin-tazobactam (ZOSYN) 3.375 GM/50ML IVPB    Comments:  Rios, Adrian: cabinet override      06/12/16 0836 06/12/16 1100   06/12/16 0600  piperacillin-tazobactam (ZOSYN) IVPB 3.375 g     3.375 g 100 mL/hr over 30 Minutes Intravenous 30 min pre-op 06/11/16 1027 06/12/16 0530   06/11/16 1400  neomycin (MYCIFRADIN) tablet 500 mg     500 mg Oral Every 8 hours 06/11/16 1100 06/11/16 2019   06/11/16 1400  metroNIDAZOLE (FLAGYL) tablet 500 mg     500 mg Oral Every 8 hours 06/11/16 1100  06/11/16 2019      Assessment/Plan:  1 - Bladder Cancer - doing well POD 4. Remain in house until full recover of bowel function.   2 - Post-op Ileus -  Advancd to fulls.   3 - Disposition / Rehab -  PT eval to hep with needs, He will need HHRN at minimum given new ostomy  Adrian Rios 06/16/2016

## 2016-06-16 NOTE — Consult Note (Signed)
[place] nurse by to see patient today for follow up from educational session yesterday  Stoma type/location: RUQ, ileal conduit with 2 intact stents Stomal assessment/size: see WOC note from 06/15/16 Output amber, clear urine Ostomy pouching: 1pc convex with barrier ring in place currently, intact seal  Education provided:  Patient reports opening and closing spigot today independently. Has been up to walk in the halls and disconnected from BSD for this. Can verbalize how to open spigot and connect to BSD to [facility] nurse.  Patient and I discussed activity levels after surgery and while yesterday he reports that he was "down in the dumps" he says he feels much better today.  Emotional support given.  [place] Nurse will follow along with you for continued support with ostomy teaching and care Kiriana Worthington [facility], RN, [place], [place]

## 2016-06-17 LAB — CREATININE, FLUID (PLEURAL, PERITONEAL, JP DRAINAGE): CREAT FL: 1 mg/dL

## 2016-06-17 NOTE — Consult Note (Signed)
[facility] Nurse ostomy consult note Stoma type/location: RUQ ileal conduit with 2 stents intact, Red = right, blue = left Stomal assessment/size: Peristomal assessment: I Treatment options for stomal/peristomal skin:  Output: amber urine Ostomy pouching: 1pc convex urostomy pouch with skin barrier ring Education provided: Came to assess pouch and ostomy and Dr. Tresa Moore was at bedside.  He is pleased with pouch and stoma. No need for anything this evening.  Will notify Maudie Flakes so she can see in am before discharge. So that wife will be present for discharge teaching Margarita Grizzle will be notified when wife has arrived.    Fara Olden, RN-C, WTA-C Wound Treatment Associate

## 2016-06-17 NOTE — Progress Notes (Signed)
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 Stepdown POD 0, to med surg floor POD1. Final Path pT1N0Mx bladder with negative margins.   2 - Post-op Ileus -  s/p bowel anastamoses at surgery above. Received Entereg pre/post-op. Initially NPO with ice chips advanced to clears POD3, Fulls POD 4 as starting to have flatus and resumed complete bowel function POD5 with transition to regular diet.   3 - Disposition / Rehab - pt completely independent at baseline. PT eval without needs. Ostomy RN following in house. Face to face for home health completed POD4.   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   Today "Adrian Rios" is stable. Several BM's yesterday and walking quite a bit.   Objective: Vital signs in last 24 hours: Temp:  [98.1 F (36.7 C)-98.3 F (36.8 C)] 98.3 F (36.8 C) (04/18 0511) Pulse Rate:  [81-95] 81 (04/18 0511) Resp:  [18-20] 20 (04/18 0511) BP: (113-116)/(64) 116/64 (04/18 0511) SpO2:  [94 %-96 %] 95 % (04/18 0850) Last BM Date: 06/16/16  Intake/Output from previous day: 04/17 0701 - 04/18 0700 In: -  Out: 1160 [Urine:1075; Drains:85] Intake/Output this shift: Total I/O In: -  Out: 260 [Urine:150; Drains:110]  General appearance: alert, cooperative, appears stated age and wife at bedside Eyes: negative Nose: Nares normal. Septum midline. Mucosa normal. No drainage or sinus tenderness. Throat: lips, mucosa, and tongue normal; teeth and gums normal Neck: supple, symmetrical, trachea midline Back: symmetric, no curvature. ROM normal. No CVA tenderness. Resp: non-labored on room air.  Cardio: Nl rate GI: soft, non-tender; bowel sounds normal; no masses,  no organomegaly Male genitalia: normal Extremities:  extremities normal, atraumatic, no cyanosis or edema Skin: Skin color, texture, turgor normal. No rashes or lesions Neurologic: Grossly normal Incision/Wound: RLQ Ursotomy pink / patent of clear urine with distal Bander stents in situ. Recent incisions c/d/I. JP with non-foul serous output.   Lab Results:   Recent Labs  06/15/16 0447  HGB 12.7*  HCT 38.0*   BMET  Recent Labs  06/15/16 0447  NA 138  K 3.6  CL 106  CO2 25  GLUCOSE 129*  BUN 10  CREATININE 0.73  CALCIUM 8.2*   PT/INR No results for input(s): LABPROT, INR in the last 72 hours. ABG No results for input(s): PHART, HCO3 in the last 72 hours.  Invalid input(s): PCO2, PO2  Studies/Results: No results found.  Anti-infectives: Anti-infectives    Start     Dose/Rate Route Frequency Ordered Stop   06/12/16 2000  piperacillin-tazobactam (ZOSYN) IVPB 3.375 g     3.375 g 12.5 mL/hr over 240 Minutes Intravenous Every 8 hours 06/12/16 1456 06/15/16 1726   06/12/16 1045  piperacillin-tazobactam (ZOSYN) IVPB 3.375 g     3.375 g 100 mL/hr over 30 Minutes Intravenous To Surgery 06/12/16 1027 06/12/16 1115   06/12/16 0836  piperacillin-tazobactam (ZOSYN) 3.375 GM/50ML IVPB    Comments:  Rios, Adrian: cabinet override      06/12/16 0836 06/12/16 1100   06/12/16 0600  piperacillin-tazobactam (ZOSYN) IVPB 3.375 g     3.375 g 100 mL/hr over 30 Minutes Intravenous 30 min pre-op 06/11/16 1027 06/12/16 0530   06/11/16 1400  neomycin (MYCIFRADIN) tablet 500 mg     500 mg Oral Every 8 hours 06/11/16 1100 06/11/16 2019  06/11/16 1400  metroNIDAZOLE (FLAGYL) tablet 500 mg     500 mg Oral Every 8 hours 06/11/16 1100 06/11/16 2019      A/P:  1 - Bladder Cancer - discussed path in detail including excellent prognosis but need for periodic surveillance for at least 10 years total. They are quite happy. Check JP Cr.   2 - Post-op Ileus -  Completely resolved. Adv to reg diet.   3 - Disposition / Rehab -  Likely DC  home tomorrow based on current progress.   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 No further therapy indicated.   [facility], Adrian Rios 06/17/2016

## 2016-06-18 ENCOUNTER — Encounter: Payer: Self-pay | Admitting: Internal Medicine

## 2016-06-18 DIAGNOSIS — Z8546 Personal history of malignant neoplasm of prostate: Secondary | ICD-10-CM | POA: Insufficient documentation

## 2016-06-18 DIAGNOSIS — Z8551 Personal history of malignant neoplasm of bladder: Secondary | ICD-10-CM | POA: Insufficient documentation

## 2016-06-18 MED ORDER — SENNOSIDES-DOCUSATE SODIUM 8.6-50 MG PO TABS: 1.0000 | ORAL_TABLET | Freq: Two times a day (BID) | ORAL | 0 refills | Status: DC

## 2016-06-18 MED ORDER — TRAMADOL HCL 50 MG PO TABS: 50.0000 mg | ORAL_TABLET | Freq: Four times a day (QID) | ORAL | 0 refills | Status: DC | PRN

## 2016-06-18 NOTE — Progress Notes (Signed)
Discharge instructions given to patient, in depth teaching complete. Questions addressed, wife at the bedside. Pt prepared for discharge. SRP, RN

## 2016-06-18 NOTE — Discharge Summary (Signed)
Physician Discharge Summary  Patient ID: Adrian Rios MRN: [id number] DOB/AGE: 04-22-1940 76 y.o.  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 Stepdown POD 0, to med surg floor POD1. Final Path pT1N0Mx bladder with negative margins. JP removed POD 6 as Cr same as serum and output minimal.  2 - Post-op Ileus -  s/p bowel anastamoses at surgery above. Received Entereg pre/post-op. Initially NPO with ice chips advanced to clears POD3, Fulls POD 4 as starting to have flatus and resumed complete bowel function POD5 with transition to regular diet.   3 - Disposition / Rehab - pt completely independent at baseline. PT eval without needs. Ostomy RN following in house. Face to face for home health completed POD4.   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   By POD 6, the day of discharge, he is ambulatory, pain controlled with PO meds, maintaining PO nutrition, home health arranged, and felt to be adequate for discharge.     Consults: None  Significant Diagnostic Studies: labs: as per above  Treatments: surgery:  cystoprostatectomy + ICG sentinel and template pelvic lymphadenectomy + ileal conduit urinary diversion 06/12/16   Discharge Exam: Blood pressure 116/70, pulse 65, temperature 98.9 F (37.2 C), temperature source Oral, resp. rate 18, height 5\' 7"  (1.702 m), weight 78.6 kg (173 lb 4.8 oz), SpO2 95 %. General appearance: alert, cooperative and appears stated age Eyes: negative Nose: Nares normal. Septum  midline. Mucosa normal. No drainage or sinus tenderness. Throat: lips, mucosa, and tongue normal; teeth and gums normal Neck: supple, symmetrical, trachea midline Back: symmetric, no curvature. ROM normal. No CVA tenderness. Resp: non-labored on room air.  Cardio: Nl rate GI: soft, non-tender; bowel sounds normal; no masses,  no organomegaly Male genitalia: normal Extremities: extremities normal, atraumatic, no cyanosis or edema Skin: Skin color, texture, turgor normal. No rashes or lesions Lymph nodes: Cervical, supraclavicular, and axillary nodes normal. Neurologic: Grossly normal Incision/Wound: JP removed and dry dressing applied, output scant and serous / non-foul. RLQ Urostomy pink and patent of copious yellow urien with distal Bander stents in appropriate position. All surgical sites / extraction sites c/d/I.   Disposition: 01-Home or Self Care   Allergies as of 06/18/2016      Reactions   Ivp Dye [iodinated Diagnostic Agents] Shortness Of Breath, Itching, Palpitations   "eyes itching,  Heart racing,  Effected breathing"   Shellfish Allergy Hives, Shortness Of Breath, Itching   Mostly crab   Gabapentin Other (See Comments)   REACTION: dizziness and flushing   Niacin-lovastatin Er Other (See Comments)   Did not feel good on medication   Adhesive [tape] Rash   Use paper tape      Medication List    STOP taking these medications   finasteride 5 MG tablet Commonly known as:  PROSCAR     TAKE these medications   albuterol 108 (90 Base) MCG/ACT inhaler Commonly known as:  PROVENTIL HFA;VENTOLIN HFA Inhale 2 puffs into the lungs every 6 (six) hours as needed for wheezing or shortness of breath.   Fluticasone-Salmeterol 250-50 MCG/DOSE Aepb Commonly known  as:  ADVAIR DISKUS INHALE ONE DOSE BY MOUTH EVERY 12 HOURS   GLUCOSAMINE CHOND DOUBLE STR PO Take 1 tablet by mouth 2 (two) times daily.   magnesium oxide 400 MG tablet Commonly known as:  MAG-OX Take 400 mg by  mouth daily.   montelukast 10 MG tablet Commonly known as:  SINGULAIR Take 1 tablet (10 mg total) by mouth daily. What changed:  when to take this   NASACORT ALLERGY 24HR 55 MCG/ACT Aero nasal inhaler Generic drug:  triamcinolone Place 1 spray into the nose at bedtime.   ranitidine 150 MG tablet Commonly known as:  ZANTAC Take 1 tablet (150 mg total) by mouth at bedtime.   senna-docusate 8.6-50 MG tablet Commonly known as:  Senokot-S Take 1 tablet by mouth 2 (two) times daily. While taking strong pain meds to prevent constipation.   traMADol 50 MG tablet Commonly known as:  ULTRAM Take 1-2 tablets (50-100 mg total) by mouth every 6 (six) hours as needed for severe pain. Post-operatviely   venlafaxine 75 MG tablet Commonly known as:  EFFEXOR TAKE ONE TABLET BY MOUTH ONCE DAILY      Follow-up Information    Alexis Frock, MD On 07/02/2016.   Specialty:  Urology Why:  at 11:30 for MD visit Contact information: [address] [address] [phone]           Signed: Alexis Frock 06/18/2016, 7:51 AM

## 2016-06-18 NOTE — Progress Notes (Signed)
MD return call, and states OK for pt to be discharged. Benedryl recommended for discomfort as ordered. Pt disharged to home. RN instructed pt and pt acknowledge understanding will take benedryl at home if needed and call MD as needed. SRP, RN

## 2016-06-18 NOTE — Progress Notes (Signed)
Pt noted to have localized rash on anterior abdomen from nipple line to groin, pt denies itching, dry skin noted with some flakiness. Wife at bedside. Pt prepared and ready for discharge. No other complaints. SRP, RN

## 2016-06-18 NOTE — Consult Note (Signed)
[place] Nurse ostomy follow up Stoma type/location: RUQ ileal conduit with 2 stents intact. R= Right, Blue = left Stomal assessment/size: 1 and 1/2 with skin barrier ring to fit 1 and 3/8 inch ostomy Peristomal assessment: intact.  Macular rash from nipple line to pubis noted, consistent with drug rash presentation.  Sophia, RN is to report to Dr. Tresa Moore. Treatment options for stomal/peristomal skin: skin barrier ring Output: clear, amber urine Ostomy pouching: 1pc convex pouch with skin barrier ring  Education provided: Patient and wife provided with second extended session for pouch change, upgraded pouch has arrived on our campus and while the differences are subtle, I want them to be familiar with them.  Improved tap closure, adapter and backing explained/taught. Also taught the benefit of feature for equal distribution of urine. Enrolled patient in [place] Discharge program: Yes Sophia reports that Dr. Tresa Moore has responded to call regarding macular rash.  No new orders received. [place] nursing team will remain available to this patient, the nursing and medical teams.  They have my contact information if needs arise post discharge pertaining to ostomy. Thanks, Maudie Flakes, MSN, RN, [place], Arther Abbott  Pager# [phone]

## 2016-06-22 DIAGNOSIS — C679 Malignant neoplasm of bladder, unspecified: Secondary | ICD-10-CM | POA: Diagnosis not present

## 2016-06-22 DIAGNOSIS — C61 Malignant neoplasm of prostate: Secondary | ICD-10-CM | POA: Diagnosis not present

## 2016-06-22 DIAGNOSIS — F329 Major depressive disorder, single episode, unspecified: Secondary | ICD-10-CM | POA: Diagnosis not present

## 2016-06-22 DIAGNOSIS — J45909 Unspecified asthma, uncomplicated: Secondary | ICD-10-CM | POA: Diagnosis not present

## 2016-06-22 DIAGNOSIS — M5137 Other intervertebral disc degeneration, lumbosacral region: Secondary | ICD-10-CM | POA: Diagnosis not present

## 2016-06-22 DIAGNOSIS — Z436 Encounter for attention to other artificial openings of urinary tract: Secondary | ICD-10-CM | POA: Diagnosis not present

## 2016-06-22 DIAGNOSIS — D860 Sarcoidosis of lung: Secondary | ICD-10-CM | POA: Diagnosis not present

## 2016-06-22 DIAGNOSIS — R7303 Prediabetes: Secondary | ICD-10-CM | POA: Diagnosis not present

## 2016-06-22 DIAGNOSIS — K219 Gastro-esophageal reflux disease without esophagitis: Secondary | ICD-10-CM | POA: Diagnosis not present

## 2016-06-23 ENCOUNTER — Ambulatory Visit (INDEPENDENT_AMBULATORY_CARE_PROVIDER_SITE_OTHER): Payer: Medicare Other | Admitting: Internal Medicine

## 2016-06-23 ENCOUNTER — Encounter: Payer: Self-pay | Admitting: Internal Medicine

## 2016-06-23 VITALS — BP 122/72 | HR 88 | Temp 97.9°F | Ht 67.0 in | Wt 165.4 lb

## 2016-06-23 DIAGNOSIS — L539 Erythematous condition, unspecified: Secondary | ICD-10-CM

## 2016-06-23 NOTE — Patient Instructions (Signed)
Monitor your leg carefully - call if worsens or does not go away.   Apply neosporin.  Try ice or heat.

## 2016-06-23 NOTE — Progress Notes (Signed)
Subjective:    Patient ID: Adrian Rios, male    DOB: April 29, 1940, 76 y.o.   MRN: [id number]  HPI He is here for an acute visit.   He was discharged from the hospital the end of last week.  He noted a red spot on the anterior right lower leg.  There is minimal pain with palpation, but otherwise he denies pain.  There has been no change since he was discharged.  He did have antibiotics in the hospital.  He denies fever/chills.  There is no numbness/tingling.  He denies other lesions.  He is unsure if he bumped his leg while in the hospital.    He has no concerns.      Medications and allergies reviewed with patient and updated if appropriate.  Patient Active Problem List   Diagnosis Date Noted  . History of bladder cancer 06/18/2016  . History of prostate cancer 06/18/2016  . 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[place]) 06/12/2016  . Bladder cancer ([place]) 06/11/2016  . Hypotension 05/16/2016  . Fever 05/16/2016  . Carotid bruit 02/17/2016  . Prediabetes 08/20/2015  . Overweight (BMI 25.0-29.9) 08/20/2015  . Esophageal reflux 08/20/2015  . Glaucoma 02/14/2015  . Varicose veins of lower extremities with other complications 73/41/9379  . RAD (reactive airway disease) 10/27/2011  . 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 Anemia, unspecified 10/27/2011  . 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 ERECTILE DYSFUNCTION 04/23/2010  . [place] DISEASE, LUMBOSACRAL SPINE 02/06/2010  . 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 Depression 04/12/2008  . Allergic rhinitis 04/12/2008  . ELEVATED PROSTATE SPECIFIC ANTIGEN 04/12/2008  . Hyperlipidemia 10/05/2006  . Sarcoidosis ([place]) 07/05/2006    Current Outpatient Prescriptions on File Prior to Visit  Medication Sig Dispense Refill  . albuterol (PROVENTIL HFA;VENTOLIN HFA) 108 (90 Base) MCG/ACT inhaler Inhale 2 puffs into the lungs every 6 (six) hours as needed for wheezing or shortness of breath. 1 Inhaler 8  . Fluticasone-Salmeterol (ADVAIR  DISKUS) 250-50 MCG/DOSE AEPB INHALE ONE DOSE BY MOUTH EVERY 12 HOURS 60 each 1  . magnesium oxide (MAG-OX) 400 MG tablet Take 400 mg by mouth daily.    . Misc Natural Products (GLUCOSAMINE CHOND DOUBLE STR PO) Take 1 tablet by mouth 2 (two) times daily.    . montelukast (SINGULAIR) 10 MG tablet Take 1 tablet (10 mg total) by mouth daily. (Patient taking differently: Take 10 mg by mouth at bedtime. ) 90 tablet 0  . ranitidine (ZANTAC) 150 MG tablet Take 1 tablet (150 mg total) by mouth at bedtime. 90 tablet 1  . senna-docusate (SENOKOT-S) 8.6-50 MG tablet Take 1 tablet by mouth 2 (two) times daily. While taking strong pain meds to prevent constipation. 30 tablet 0  . traMADol (ULTRAM) 50 MG tablet Take 1-2 tablets (50-100 mg total) by mouth every 6 (six) hours as needed for severe pain. Post-operatviely 20 tablet 0  . triamcinolone (NASACORT ALLERGY 24HR) 55 MCG/ACT AERO nasal inhaler Place 1 spray into the nose at bedtime.    Marland Kitchen venlafaxine (EFFEXOR) 75 MG tablet TAKE ONE TABLET BY MOUTH ONCE DAILY 90 tablet 1   No current facility-administered medications on file prior to visit.     Past Medical History:  Diagnosis Date  . Allergic rhinitis   . Arthritis   . Benign localized prostatic hyperplasia with lower urinary tract symptoms (LUTS)   . Bladder tumor   . Borderline glaucoma of right eye   . Carotid bruit    per duplex 03-11-2016 RICA 1-39%  .  Chronic throat clearing   . DDD (degenerative disc disease), lumbar   . Depression   . ED (erectile dysfunction)   . Elevated PSA    urologist-  dr [name]--- s/p  prostate bx's  . GERD (gastroesophageal reflux disease)   . Hematuria   . History of chronic bronchitis   . 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 History of squamous cell carcinoma excision   . History of squamous cell carcinoma excision    several times  . Hyperlipidemia   . Pre-diabetes   . Premature ventricular contractions (PVCs) (VPCs)     . RAD (reactive airway disease)   . 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 Sensorineural hearing loss (SNHL) of both ears   . Wears glasses   . Wears hearing aid    bilateral    Past Surgical History:  Procedure Laterality Date  . APPENDECTOMY  2008  . CARDIOVASCULAR STRESS TEST  05/27/2010   normal nuclear study w/ no ischemia/  normal LV function and wall motion , ef 60%  . CYSTOSCOPY WITH INJECTION N/A 06/12/2016   Procedure: CYSTOSCOPY WITH INJECTION OF INDOCYANINE GREEN DYE;  Surgeon: Alexis Frock, MD;  Location: [facility];  Service: Urology;  Laterality: N/A;  . DEEP NECK LYMPH NODE BIOPSY / EXCISION  1970's   and Bronchoscopy w/ bx's ( dx Sarcoidosis)  . MOHS SURGERY  2013 approx.    nasal; pre melanoma  . PARS PLANA VITRECTOMY Right 2007   repair macular pucker  . TONSILLECTOMY AND ADENOIDECTOMY  child  . TRANSURETHRAL RESECTION OF BLADDER TUMOR N/A 04/06/2016   Procedure: CYSTOSCOPY TRANSURETHRAL RESECTION OF BLADDER TUMORS (TURBT);  Surgeon: Carolan Clines, MD;  Location: [facility];  Service: Urology;  Laterality: N/A;  . TRANSURETHRAL RESECTION OF PROSTATE      Social History   Social History  . Marital status: Married    Spouse name: N/A  . Number of children: N/A  . Years of education: N/A   Social History Main Topics  . Smoking status: Never Smoker  . Smokeless tobacco: Never Used  . Alcohol use No  . Drug use: No  . Sexual activity: No   Other Topics Concern  . None   Social History Narrative  . None    Family History  Problem Relation Age of Onset  . Stroke Mother     in her 31s  . Breast cancer Sister   . Heart disease Maternal Grandmother 84    MI   . Breast cancer Paternal Grandmother   . Heart disease Paternal Grandfather 25    MI  . Stroke Brother   . Diabetes Neg Hx     Review of Systems  Constitutional: Negative for chills and  fever.  Cardiovascular: Negative for leg swelling.  Skin: Positive for color change. Negative for rash and wound.  Neurological: Negative for numbness.       Objective:   Vitals:   06/23/16 1619  BP: 122/72  Pulse: 88  Temp: 97.9 F (36.6 C)   Filed Weights   06/23/16 1619  Weight: 165 lb 6.4 oz (75 kg)   Body mass index is 25.91 kg/m.  Wt Readings from Last 3 Encounters:  06/23/16 165 lb 6.4 oz (75 kg)  06/11/16 173 lb 4.8 oz (78.6 kg)  05/16/16 174 lb  12.8 oz (79.3 kg)     Physical Exam  Constitutional: He appears well-developed and well-nourished. No distress.  Musculoskeletal: He exhibits no edema.  Skin: He is not diaphoretic. There is erythema (orange size erythema, mildly tender, slightly swollen, minimal warmth).  No wound. No other leg lesions          Assessment & Plan:   See Problem List for Assessment and Plan of chronic medical problems.

## 2016-06-23 NOTE — Assessment & Plan Note (Signed)
Right anterior lower leg - no change over the past 5 or so days Minimal tenderness and warm ? Cellulitis - I would have expected it to progress and it has not He did have peri-operative antibiotics No evidence of superficial or deep DVT Discussed possible causes We agreed to monitor closely for now Can try an antibiotic ointment, ice/heat He will call if this doe not resolve or get worse - will consider antibiotics if needed

## 2016-06-25 ENCOUNTER — Other Ambulatory Visit: Payer: Self-pay | Admitting: Internal Medicine

## 2016-06-25 DIAGNOSIS — C61 Malignant neoplasm of prostate: Secondary | ICD-10-CM | POA: Diagnosis not present

## 2016-06-25 DIAGNOSIS — M5137 Other intervertebral disc degeneration, lumbosacral region: Secondary | ICD-10-CM | POA: Diagnosis not present

## 2016-06-25 DIAGNOSIS — C679 Malignant neoplasm of bladder, unspecified: Secondary | ICD-10-CM | POA: Diagnosis not present

## 2016-06-25 DIAGNOSIS — J45909 Unspecified asthma, uncomplicated: Secondary | ICD-10-CM | POA: Diagnosis not present

## 2016-06-25 DIAGNOSIS — Z436 Encounter for attention to other artificial openings of urinary tract: Secondary | ICD-10-CM | POA: Diagnosis not present

## 2016-06-25 DIAGNOSIS — D860 Sarcoidosis of lung: Secondary | ICD-10-CM | POA: Diagnosis not present

## 2016-06-26 ENCOUNTER — Emergency Department (HOSPITAL_BASED_OUTPATIENT_CLINIC_OR_DEPARTMENT_OTHER)
Admission: EM | Admit: 2016-06-26 | Discharge: 2016-06-26 | Disposition: A | Discharge status: Home / Self Care | Payer: Medicare Other | Attending: Emergency Medicine | Admitting: Emergency Medicine

## 2016-06-26 ENCOUNTER — Encounter (HOSPITAL_BASED_OUTPATIENT_CLINIC_OR_DEPARTMENT_OTHER): Payer: Self-pay | Admitting: *Deleted

## 2016-06-26 ENCOUNTER — Telehealth: Payer: Self-pay | Admitting: Internal Medicine

## 2016-06-26 DIAGNOSIS — I82811 Embolism and thrombosis of superficial veins of right lower extremities: Secondary | ICD-10-CM | POA: Diagnosis not present

## 2016-06-26 DIAGNOSIS — I8001 Phlebitis and thrombophlebitis of superficial vessels of right lower extremity: Secondary | ICD-10-CM

## 2016-06-26 DIAGNOSIS — Z8546 Personal history of malignant neoplasm of prostate: Secondary | ICD-10-CM | POA: Insufficient documentation

## 2016-06-26 DIAGNOSIS — Z8551 Personal history of malignant neoplasm of bladder: Secondary | ICD-10-CM | POA: Insufficient documentation

## 2016-06-26 DIAGNOSIS — Z79899 Other long term (current) drug therapy: Secondary | ICD-10-CM | POA: Insufficient documentation

## 2016-06-26 DIAGNOSIS — M79604 Pain in right leg: Secondary | ICD-10-CM | POA: Diagnosis present

## 2016-06-26 HISTORY — DX: Malignant (primary) neoplasm, unspecified: C80.1

## 2016-06-26 MED ORDER — CEPHALEXIN 500 MG PO CAPS: 500.0000 mg | ORAL_CAPSULE | Freq: Three times a day (TID) | ORAL | 0 refills | Status: DC

## 2016-06-26 MED FILL — CEPHALEXIN 500 MG CAPSULE: 500 | 7 days supply | Qty: 21 | Fill #0

## 2016-06-26 NOTE — Discharge Instructions (Signed)
It was our pleasure to provide your ER care today - we hope that you feel better.  Take an enteric coated aspirin a day.  Take keflex (antibiotic) as prescribed.  Keep area very clean.  Apply warm compresses/heat to sore area.  Follow up with primary care doctor in 1 week if symptoms fail to improve.  Return  to ER if worse, spreading redness, increased swelling or severe pain, other concern.

## 2016-06-26 NOTE — Telephone Encounter (Signed)
Patient noted to be checked into the ED.

## 2016-06-26 NOTE — Telephone Encounter (Signed)
Routing to greg for 2nd opinion---patient recd IV abx treatment during his recent 7 day stay in hospital, and came in for hospital fu with dr burns 3 days ago----I feel this patient should probably return back to hospital with these symptoms reoccuring---please advise, do you think he should have office visit ( I could prob get him worked in at [facility] [employer]) or should he go to ED

## 2016-06-26 NOTE — ED Triage Notes (Addendum)
Pt has an area of swelling, tender to touch, on front of his right shin. States he was seen by his PCP on Tuesday and advised to seek further treatment if it did not improve. Redness noted, no open wound present. Pt states it has been there since he had bladder removal surgery 2 weeks ago.

## 2016-06-26 NOTE — Telephone Encounter (Signed)
Patient wife called in and stated that her husband knot on his leg is getting worse. She states it is harder and now has pain with it. She wanted to know if you would send in an abx. She states its time to do something about this thing. Please advise.  Patient was here on 06/22/16 Informed [name] was out of office today and may not here nothing until Monday. Asked could another doctor follow up on it. I stated they will if they can but [name] was the one that saw that.

## 2016-06-26 NOTE — ED Notes (Signed)
Pt refused wheelchair. Ambulating without assistance, in NAD.

## 2016-06-26 NOTE — ED Provider Notes (Signed)
[facility] Provider Note   CSN: [id number] Arrival date & time: 06/26/16  1051     History   Chief Complaint Chief Complaint  Patient presents with  . Leg Pain    HPI SIVAN QUAST is a 76 y.o. male.  Patient presents with small,  focal, sore, erythematous area to anterior aspect right lower leg for the past 1-2 weeks. Denies hx same. Denies trauma to area. Symptoms moderate, persistent, ?mildly worse today. No calf swelling or pain. No fever or chills. No cp or sob. No hx dvt or pe.  Patient indicates otherwise feels well.  Had surgery 2 weeks ago on bladder due to bladder ca.    The history is provided by the patient.  Leg Pain   Pertinent negatives include no numbness.    Past Medical History:  Diagnosis Date  . Allergic rhinitis   . Arthritis   . Benign localized prostatic hyperplasia with lower urinary tract symptoms (LUTS)   . Bladder tumor   . Borderline glaucoma of right eye   . Cancer [facility])    bladder and prostate  . Carotid bruit    per duplex 03-11-2016 RICA 1-39%  . Chronic throat clearing   . DDD (degenerative disc disease), lumbar   . Depression   . ED (erectile dysfunction)   . Elevated PSA    urologist-  dr [name]--- s/p  prostate bx's  . GERD (gastroesophageal reflux disease)   . Hematuria   . History of chronic bronchitis   . 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 History of squamous cell carcinoma excision   . History of squamous cell carcinoma excision    several times  . Hyperlipidemia   . Pre-diabetes   . Premature ventricular contractions (PVCs) (VPCs)   . RAD (reactive airway disease)   . 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 Sensorineural hearing loss (SNHL) of both ears   . Wears glasses   . Wears hearing aid    bilateral    Patient Active Problem List   Diagnosis Date Noted  . Skin erythema  06/23/2016  . History of bladder cancer 06/18/2016  . History of prostate cancer 06/18/2016  . 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 Bladder cancer ([place]) 06/11/2016  . Hypotension 05/16/2016  . Fever 05/16/2016  . Carotid bruit 02/17/2016  . Prediabetes 08/20/2015  . Overweight (BMI 25.0-29.9) 08/20/2015  . Esophageal reflux 08/20/2015  . Glaucoma 02/14/2015  . Varicose veins of lower extremities with other complications 40/98/1191  . RAD (reactive airway disease) 10/27/2011  . 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 Anemia, unspecified 10/27/2011  . 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 ERECTILE DYSFUNCTION 04/23/2010  . [place] DISEASE, LUMBOSACRAL SPINE 02/06/2010  . 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 Depression 04/12/2008  . Allergic rhinitis 04/12/2008  . ELEVATED PROSTATE SPECIFIC ANTIGEN 04/12/2008  . Hyperlipidemia 10/05/2006  . Sarcoidosis ([place]) 07/05/2006    Past Surgical History:  Procedure Laterality Date  . APPENDECTOMY  2008  . CARDIOVASCULAR STRESS TEST  05/27/2010   normal nuclear study w/ no ischemia/  normal LV function and wall motion , ef 60%  . CYSTOSCOPY WITH INJECTION N/A 06/12/2016   Procedure: CYSTOSCOPY WITH INJECTION OF INDOCYANINE GREEN DYE;  Surgeon: Alexis Frock, MD;  Location: [facility];  Service: Urology;  Laterality: N/A;  .  DEEP NECK LYMPH NODE BIOPSY / EXCISION  1970's   and Bronchoscopy w/ bx's ( dx Sarcoidosis)  . ILEOSTOMY    . MOHS SURGERY  2013 approx.    nasal; pre melanoma  . PARS PLANA VITRECTOMY Right 2007   repair macular pucker  . TONSILLECTOMY AND ADENOIDECTOMY  child  . TRANSURETHRAL RESECTION OF BLADDER TUMOR N/A 04/06/2016   Procedure: CYSTOSCOPY TRANSURETHRAL RESECTION OF BLADDER TUMORS (TURBT);  Surgeon: Carolan Clines, MD;  Location: [facility];  Service: Urology;  Laterality: N/A;  . TRANSURETHRAL RESECTION OF PROSTATE         Home Medications    Prior to Admission  medications   Medication Sig Start Date End Date Taking? Authorizing Provider  albuterol (PROVENTIL HFA;VENTOLIN HFA) 108 (90 Base) MCG/ACT inhaler Inhale 2 puffs into the lungs every 6 (six) hours as needed for wheezing or shortness of breath. 08/27/15  Yes [name], MD  fluconazole (DIFLUCAN) 150 MG tablet Take 150 mg by mouth daily.   Yes Historical Provider, MD  Fluticasone-Salmeterol (ADVAIR DISKUS) 250-50 MCG/DOSE AEPB INHALE ONE DOSE BY MOUTH EVERY 12 HOURS 04/20/16  Yes [name], MD  magnesium oxide (MAG-OX) 400 MG tablet Take 400 mg by mouth daily.   Yes Historical Provider, MD  Misc Natural Products (GLUCOSAMINE CHOND DOUBLE STR PO) Take 1 tablet by mouth 2 (two) times daily.   Yes Historical Provider, MD  montelukast (SINGULAIR) 10 MG tablet Take 1 tablet (10 mg total) by mouth daily. Patient taking differently: Take 10 mg by mouth at bedtime.  06/18/15  Yes [name], MD  ranitidine (ZANTAC) 150 MG tablet Take 1 tablet (150 mg total) by mouth at bedtime. 08/20/15  Yes [name], MD  triamcinolone (NASACORT ALLERGY 24HR) 55 MCG/ACT AERO nasal inhaler Place 1 spray into the nose at bedtime.   Yes Historical Provider, MD  venlafaxine (EFFEXOR) 75 MG tablet TAKE ONE TABLET BY MOUTH ONCE DAILY 06/25/16  Yes [name], MD  senna-docusate (SENOKOT-S) 8.6-50 MG tablet Take 1 tablet by mouth 2 (two) times daily. While taking strong pain meds to prevent constipation. 06/18/16   Alexis Frock, MD  traMADol (ULTRAM) 50 MG tablet Take 1-2 tablets (50-100 mg total) by mouth every 6 (six) hours as needed for severe pain. Post-operatviely 06/18/16   Alexis Frock, MD    Family History Family History  Problem Relation Age of Onset  . Stroke Mother     in her 40s  . Breast cancer Sister   . Heart disease Maternal Grandmother 84    MI   . Breast cancer Paternal Grandmother   . Heart disease Paternal Grandfather 26    MI  . Stroke Brother   . Diabetes Neg Hx     Social  History Social History  Substance Use Topics  . Smoking status: Never Smoker  . Smokeless tobacco: Never Used  . Alcohol use No     Allergies   Ivp dye [iodinated diagnostic agents]; Shellfish allergy; Gabapentin; Niacin-lovastatin er; and Adhesive [tape]   Review of Systems Review of Systems  Constitutional: Negative for chills and fever.  Respiratory: Negative for shortness of breath.   Cardiovascular: Negative for leg swelling.  Neurological: Negative for numbness.     Physical Exam Updated Vital Signs BP 108/75 (BP Location: Left Arm)   Pulse 96   Temp 98.1 F (36.7 C) (Oral)   Resp 16   Ht 5\' 7"  (1.702 m)   Wt 74.8 kg  SpO2 97%   BMI 25.84 kg/m   Physical Exam  Constitutional: He is oriented to person, place, and time. He appears well-developed and well-nourished. No distress.  Eyes: Conjunctivae are normal.  Neck: Neck supple. No tracheal deviation present.  Cardiovascular: Normal rate and intact distal pulses.   Pulmonary/Chest: Effort normal. No accessory muscle usage. No respiratory distress.  Musculoskeletal: He exhibits no edema.  No RLE edema.  Pt has superficial varicose veins to lower legs. On mid right lower leg, just medial to tibia, pt w smalla area, approx 1 by 3 cm superficial thrombophlebitis, w associated mild skin erythema. No fluctuance/abscess.  Distal pulses palp. No calf pain, swelling or tenderness.   Neurological: He is alert and oriented to person, place, and time.  Skin: Skin is warm and dry. No rash noted. He is not diaphoretic.  Psychiatric: He has a normal mood and affect.  Nursing note and vitals reviewed.    ED Treatments / Results  Labs (all labs ordered are listed, but only abnormal results are displayed) Labs Reviewed - No data to display  EKG  EKG Interpretation None       Radiology No results found.  Procedures Procedures (including critical care time)  Medications Ordered in ED Medications - No data to  display   Initial Impression / Assessment and Plan / ED Course  I have reviewed the triage vital signs and the nursing notes.  Pertinent labs & imaging results that were available during my care of the patient were reviewed by me and considered in my medical decision making (see chart for details).  Exam c/w superficial thrombophlebitis.  w associated erythema, mild increased warmth, ?mild/v early cellulitis. Will rx abx, asa, warm compresses.     Final Clinical Impressions(s) / ED Diagnoses   Final diagnoses:  None    New Prescriptions New Prescriptions   No medications on file     Lajean Saver, MD 06/26/16 1128

## 2016-06-30 ENCOUNTER — Other Ambulatory Visit: Payer: Self-pay | Admitting: Internal Medicine

## 2016-06-30 DIAGNOSIS — D860 Sarcoidosis of lung: Secondary | ICD-10-CM | POA: Diagnosis not present

## 2016-06-30 DIAGNOSIS — C679 Malignant neoplasm of bladder, unspecified: Secondary | ICD-10-CM | POA: Diagnosis not present

## 2016-06-30 DIAGNOSIS — M5137 Other intervertebral disc degeneration, lumbosacral region: Secondary | ICD-10-CM | POA: Diagnosis not present

## 2016-06-30 DIAGNOSIS — J45909 Unspecified asthma, uncomplicated: Secondary | ICD-10-CM | POA: Diagnosis not present

## 2016-06-30 DIAGNOSIS — C61 Malignant neoplasm of prostate: Secondary | ICD-10-CM | POA: Diagnosis not present

## 2016-06-30 DIAGNOSIS — Z436 Encounter for attention to other artificial openings of urinary tract: Secondary | ICD-10-CM | POA: Diagnosis not present

## 2016-07-06 DIAGNOSIS — C61 Malignant neoplasm of prostate: Secondary | ICD-10-CM | POA: Diagnosis not present

## 2016-07-06 DIAGNOSIS — D860 Sarcoidosis of lung: Secondary | ICD-10-CM | POA: Diagnosis not present

## 2016-07-06 DIAGNOSIS — J45909 Unspecified asthma, uncomplicated: Secondary | ICD-10-CM | POA: Diagnosis not present

## 2016-07-06 DIAGNOSIS — M5137 Other intervertebral disc degeneration, lumbosacral region: Secondary | ICD-10-CM | POA: Diagnosis not present

## 2016-07-06 DIAGNOSIS — Z436 Encounter for attention to other artificial openings of urinary tract: Secondary | ICD-10-CM | POA: Diagnosis not present

## 2016-07-06 DIAGNOSIS — C679 Malignant neoplasm of bladder, unspecified: Secondary | ICD-10-CM | POA: Diagnosis not present

## 2016-07-16 ENCOUNTER — Ambulatory Visit: Payer: Medicare Other | Admitting: Oncology

## 2016-07-16 DIAGNOSIS — N281 Cyst of kidney, acquired: Secondary | ICD-10-CM | POA: Diagnosis not present

## 2016-07-16 DIAGNOSIS — C678 Malignant neoplasm of overlapping sites of bladder: Secondary | ICD-10-CM | POA: Diagnosis not present

## 2016-07-16 DIAGNOSIS — C61 Malignant neoplasm of prostate: Secondary | ICD-10-CM | POA: Diagnosis not present

## 2016-07-23 ENCOUNTER — Ambulatory Visit (HOSPITAL_BASED_OUTPATIENT_CLINIC_OR_DEPARTMENT_OTHER): Payer: Medicare Other | Admitting: Oncology

## 2016-07-23 VITALS — BP 107/69 | HR 85 | Temp 98.1°F | Resp 18 | Ht 67.0 in | Wt 162.2 lb

## 2016-07-23 DIAGNOSIS — C679 Malignant neoplasm of bladder, unspecified: Secondary | ICD-10-CM | POA: Diagnosis not present

## 2016-07-23 DIAGNOSIS — C61 Malignant neoplasm of prostate: Secondary | ICD-10-CM

## 2016-07-23 NOTE — Progress Notes (Signed)
Hematology and Oncology Follow Up Visit  Adrian Rios [id number] 02/21/1941 76 y.o. 07/23/2016 3:16 PM Adrian Rios, Adrian Rios, MDBurns, Adrian Lick, MD   Principle Diagnosis: 76 year old gentleman with superficial bladder tumor diagnosed in February 2018. He has noninvasive high-grade papillary urothelial carcinoma.  Prior Therapy: He is status post robotic-assisted laparoscopic radical cystoprostatectomy and bilateral lymph node dissection. The final pathology revealed noninvasive urothelial carcinoma with no lymph node involvement. This was completed on 06/12/2016.  Current therapy: Observation and surveillance.  Interim History: Adrian Rios presents today for a follow-up visit. Since the last visit, he completed cystoprostatectomy and lymph node dissection and have tolerated it well. The final pathology did not show any invasive disease within the bladder wall. He has recovered reasonably well at this time although his appetite remains slow. He lost close to 10 pounds. He resumed most activities of daily living.  He does not report any headaches, blurry vision, syncope or seizures. He is not reporting any fevers, chills or sweats. He does not report any cough, wheezing or hemoptysis. He is not reporting nausea, vomiting or abdominal pain. He does not report any frequency urgency or hesitancy. He does not report a skeletal complaints. Remaining review of systems unremarkable.    Medications: I have reviewed the patient's current medications.  Current Outpatient Prescriptions  Medication Sig Dispense Refill  . albuterol (PROVENTIL HFA;VENTOLIN HFA) 108 (90 Base) MCG/ACT inhaler Inhale 2 puffs into the lungs every 6 (six) hours as needed for wheezing or shortness of breath. 1 Inhaler 8  . Fluticasone-Salmeterol (ADVAIR DISKUS) 250-50 MCG/DOSE AEPB INHALE ONE DOSE BY MOUTH EVERY 12 HOURS 60 each 1  . magnesium oxide (MAG-OX) 400 MG tablet Take 400 mg by mouth daily.    . Misc Natural Products (GLUCOSAMINE  CHOND DOUBLE STR PO) Take 1 tablet by mouth 2 (two) times daily.    . montelukast (SINGULAIR) 10 MG tablet Take 1 tablet (10 mg total) by mouth daily. (Patient taking differently: Take 10 mg by mouth at bedtime. ) 90 tablet 0  . ranitidine (ZANTAC) 150 MG tablet TAKE ONE TABLET BY MOUTH AT BEDTIME 90 tablet 1  . triamcinolone (NASACORT ALLERGY 24HR) 55 MCG/ACT AERO nasal inhaler Place 1 spray into the nose at bedtime.    Marland Kitchen venlafaxine (EFFEXOR) 75 MG tablet TAKE ONE TABLET BY MOUTH ONCE DAILY 90 tablet 0   No current facility-administered medications for this visit.      Allergies:  Allergies  Allergen Reactions  . Ivp Dye [Iodinated Diagnostic Agents] Shortness Of Breath, Itching and Palpitations    "eyes itching,  Heart racing,  Effected breathing"  . Shellfish Allergy Hives, Shortness Of Breath and Itching    Mostly crab  . Gabapentin Other (See Comments)    REACTION: dizziness and flushing  . Niacin-Lovastatin Er Other (See Comments)    Did not feel good on medication  . Adhesive [Tape] Rash    Use paper tape    Past Medical History, Surgical history, Social history, and Family History were reviewed and updated.   Physical Exam: Blood pressure 107/69, pulse 85, temperature 98.1 F (36.7 C), temperature source Oral, resp. rate 18, height 5\' 7"  (1.702 m), weight 162 lb 3.2 oz (73.6 kg), SpO2 98 %. ECOG: 1 General appearance: alert and cooperative Head: Normocephalic, without obvious abnormality Neck: no adenopathy Lymph nodes: Cervical, supraclavicular, and axillary nodes normal. Heart:regular rate and rhythm, S1, S2 normal, no murmur, click, rub or gallop Lung:chest clear, no wheezing, rales, normal symmetric air entry.  Abdomin: soft, non-tender, without masses or organomegaly EXT:no erythema, induration, or nodules   Lab Results: Lab Results  Component Value Date   WBC 8.4 06/11/2016   HGB 12.7 (L) 06/15/2016   HCT 38.0 (L) 06/15/2016   MCV 90.1 06/11/2016   PLT  341 06/11/2016     Chemistry      Component Value Date/Time   NA 138 06/15/2016 0447   K 3.6 06/15/2016 0447   CL 106 06/15/2016 0447   CO2 25 06/15/2016 0447   BUN 10 06/15/2016 0447   CREATININE 0.73 06/15/2016 0447      Component Value Date/Time   CALCIUM 8.2 (L) 06/15/2016 0447   ALKPHOS 82 06/11/2016 1045   AST 22 06/11/2016 1045   ALT 19 06/11/2016 1045   BILITOT 0.6 06/11/2016 1045       Impression and Plan:  76 year old gentleman with the following issues:  1. Superficial bladder tumors diagnosed in February 2018. He presented with hematuria and underwent TURBT on 04/06/2016. The pathology from these tumors indicate noninvasive high-grade papillary urothelial carcinoma on the right lateral wall wall and left lateral wall diverticular edge.  He is status post cystoprostatectomy and bilateral lymph node dissection on 06/12/2016. The final pathology did not reveal any muscle invasive disease.  There is no role for any adjuvant therapy at this setting and I recommended observation and surveillance. He'll continue to follow with Dr. Tresa Moore periodically regarding this issue.  2. Prostate cancer: This was detected on his cystoprostatectomy. No additional therapy is indicated.  3. Follow-up: Will be as needed in the future.  Zola Button, MD 5/24/20183:16 PM

## 2016-07-30 DIAGNOSIS — C678 Malignant neoplasm of overlapping sites of bladder: Secondary | ICD-10-CM | POA: Diagnosis not present

## 2016-07-30 DIAGNOSIS — N281 Cyst of kidney, acquired: Secondary | ICD-10-CM | POA: Diagnosis not present

## 2016-07-30 DIAGNOSIS — C61 Malignant neoplasm of prostate: Secondary | ICD-10-CM | POA: Diagnosis not present

## 2016-08-04 ENCOUNTER — Inpatient Hospital Stay (HOSPITAL_COMMUNITY)
Admission: EM | Admit: 2016-08-04 | Discharge: 2016-08-06 | DRG: 871 | Disposition: A | Discharge status: Home / Self Care | Payer: Medicare Other | Attending: Internal Medicine | Admitting: Internal Medicine

## 2016-08-04 ENCOUNTER — Encounter (HOSPITAL_COMMUNITY): Payer: Self-pay | Admitting: Emergency Medicine

## 2016-08-04 ENCOUNTER — Emergency Department (HOSPITAL_COMMUNITY): Payer: Medicare Other

## 2016-08-04 DIAGNOSIS — D869 Sarcoidosis, unspecified: Secondary | ICD-10-CM | POA: Diagnosis not present

## 2016-08-04 DIAGNOSIS — N390 Urinary tract infection, site not specified: Secondary | ICD-10-CM | POA: Diagnosis not present

## 2016-08-04 DIAGNOSIS — F419 Anxiety disorder, unspecified: Secondary | ICD-10-CM | POA: Diagnosis present

## 2016-08-04 DIAGNOSIS — J9601 Acute respiratory failure with hypoxia: Secondary | ICD-10-CM | POA: Diagnosis present

## 2016-08-04 DIAGNOSIS — R509 Fever, unspecified: Secondary | ICD-10-CM | POA: Diagnosis not present

## 2016-08-04 DIAGNOSIS — Z9079 Acquired absence of other genital organ(s): Secondary | ICD-10-CM | POA: Diagnosis not present

## 2016-08-04 DIAGNOSIS — F32A Depression, unspecified: Secondary | ICD-10-CM | POA: Diagnosis present

## 2016-08-04 DIAGNOSIS — C679 Malignant neoplasm of bladder, unspecified: Secondary | ICD-10-CM | POA: Diagnosis not present

## 2016-08-04 DIAGNOSIS — J189 Pneumonia, unspecified organism: Secondary | ICD-10-CM | POA: Diagnosis present

## 2016-08-04 DIAGNOSIS — R0682 Tachypnea, not elsewhere classified: Secondary | ICD-10-CM | POA: Diagnosis present

## 2016-08-04 DIAGNOSIS — A4151 Sepsis due to Escherichia coli [E. coli]: Secondary | ICD-10-CM | POA: Diagnosis not present

## 2016-08-04 DIAGNOSIS — F329 Major depressive disorder, single episode, unspecified: Secondary | ICD-10-CM | POA: Diagnosis not present

## 2016-08-04 DIAGNOSIS — K219 Gastro-esophageal reflux disease without esophagitis: Secondary | ICD-10-CM | POA: Diagnosis present

## 2016-08-04 DIAGNOSIS — Z906 Acquired absence of other parts of urinary tract: Secondary | ICD-10-CM

## 2016-08-04 DIAGNOSIS — Z8546 Personal history of malignant neoplasm of prostate: Secondary | ICD-10-CM | POA: Diagnosis not present

## 2016-08-04 DIAGNOSIS — A419 Sepsis, unspecified organism: Secondary | ICD-10-CM | POA: Diagnosis not present

## 2016-08-04 DIAGNOSIS — Z8551 Personal history of malignant neoplasm of bladder: Secondary | ICD-10-CM

## 2016-08-04 DIAGNOSIS — Z79899 Other long term (current) drug therapy: Secondary | ICD-10-CM

## 2016-08-04 DIAGNOSIS — J440 Chronic obstructive pulmonary disease with acute lower respiratory infection: Secondary | ICD-10-CM | POA: Diagnosis present

## 2016-08-04 DIAGNOSIS — Z6825 Body mass index (BMI) 25.0-25.9, adult: Secondary | ICD-10-CM | POA: Diagnosis not present

## 2016-08-04 DIAGNOSIS — R6510 Systemic inflammatory response syndrome (SIRS) of non-infectious origin without acute organ dysfunction: Secondary | ICD-10-CM | POA: Diagnosis present

## 2016-08-04 DIAGNOSIS — J45909 Unspecified asthma, uncomplicated: Secondary | ICD-10-CM | POA: Diagnosis present

## 2016-08-04 DIAGNOSIS — R0600 Dyspnea, unspecified: Secondary | ICD-10-CM

## 2016-08-04 DIAGNOSIS — B9620 Unspecified Escherichia coli [E. coli] as the cause of diseases classified elsewhere: Secondary | ICD-10-CM | POA: Diagnosis not present

## 2016-08-04 DIAGNOSIS — E440 Moderate protein-calorie malnutrition: Secondary | ICD-10-CM | POA: Diagnosis present

## 2016-08-04 DIAGNOSIS — R000 Tachycardia, unspecified: Secondary | ICD-10-CM | POA: Diagnosis present

## 2016-08-04 LAB — COMPREHENSIVE METABOLIC PANEL
ALT: 17 U/L (ref 17–63)
ANION GAP: 14 (ref 5–15)
AST: 28 U/L (ref 15–41)
Albumin: 3.3 g/dL — ABNORMAL LOW (ref 3.5–5.0)
Alkaline Phosphatase: 138 U/L — ABNORMAL HIGH (ref 38–126)
BUN: 17 mg/dL (ref 6–20)
CHLORIDE: 109 mmol/L (ref 101–111)
CO2: 17 mmol/L — ABNORMAL LOW (ref 22–32)
Calcium: 8.8 mg/dL — ABNORMAL LOW (ref 8.9–10.3)
Creatinine, Ser: 1.01 mg/dL (ref 0.61–1.24)
Glucose, Bld: 118 mg/dL — ABNORMAL HIGH (ref 65–99)
POTASSIUM: 3.8 mmol/L (ref 3.5–5.1)
Sodium: 140 mmol/L (ref 135–145)
Total Bilirubin: 1 mg/dL (ref 0.3–1.2)
Total Protein: 7.3 g/dL (ref 6.5–8.1)

## 2016-08-04 LAB — CBC WITH DIFFERENTIAL/PLATELET
BASOS ABS: 0 10*3/uL (ref 0.0–0.1)
BASOS PCT: 0 %
Basophils Absolute: 0 10*3/uL (ref 0.0–0.1)
Basophils Relative: 0 %
Eosinophils Absolute: 0.1 10*3/uL (ref 0.0–0.7)
Eosinophils Absolute: 0 10*3/uL (ref 0.0–0.7)
Eosinophils Relative: 1 %
Eosinophils Relative: 0 %
HEMATOCRIT: 36.2 % — AB (ref 39.0–52.0)
HEMATOCRIT: 30.3 % — AB (ref 39.0–52.0)
HEMOGLOBIN: 12.3 g/dL — AB (ref 13.0–17.0)
HEMOGLOBIN: 10 g/dL — AB (ref 13.0–17.0)
LYMPHS ABS: 0.9 10*3/uL (ref 0.7–4.0)
Lymphocytes Relative: 10 %
Lymphocytes Relative: 5 %
Lymphs Abs: 1.3 10*3/uL (ref 0.7–4.0)
MCH: 29.6 pg (ref 26.0–34.0)
MCH: 29.2 pg (ref 26.0–34.0)
MCHC: 34 g/dL (ref 30.0–36.0)
MCHC: 33 g/dL (ref 30.0–36.0)
MCV: 87.2 fL (ref 78.0–100.0)
MCV: 88.6 fL (ref 78.0–100.0)
MONO ABS: 0.6 10*3/uL (ref 0.1–1.0)
MONOS PCT: 4 %
Monocytes Absolute: 0.1 10*3/uL (ref 0.1–1.0)
Monocytes Relative: 1 %
NEUTROS ABS: 11.1 10*3/uL — AB (ref 1.7–7.7)
NEUTROS PCT: 88 %
NEUTROS PCT: 91 %
Neutro Abs: 15.1 10*3/uL — ABNORMAL HIGH (ref 1.7–7.7)
Platelets: 390 10*3/uL (ref 150–400)
Platelets: 341 10*3/uL (ref 150–400)
RBC: 4.15 MIL/uL — AB (ref 4.22–5.81)
RBC: 3.42 MIL/uL — ABNORMAL LOW (ref 4.22–5.81)
RDW: 12.6 % (ref 11.5–15.5)
RDW: 12.9 % (ref 11.5–15.5)
WBC: 12.6 10*3/uL — AB (ref 4.0–10.5)
WBC: 16.5 10*3/uL — ABNORMAL HIGH (ref 4.0–10.5)

## 2016-08-04 LAB — BLOOD GAS, ARTERIAL
Acid-base deficit: 1.1 mmol/L (ref 0.0–2.0)
Bicarbonate: 21.1 mmol/L (ref 20.0–28.0)
DRAWN BY: 295031
O2 CONTENT: 2 L/min
O2 SAT: 94.6 %
PATIENT TEMPERATURE: 98.6
PO2 ART: 68.3 mmHg — AB (ref 83.0–108.0)
pCO2 arterial: 28.1 mmHg — ABNORMAL LOW (ref 32.0–48.0)
pH, Arterial: 7.488 — ABNORMAL HIGH (ref 7.350–7.450)

## 2016-08-04 LAB — PROTIME-INR
INR: 1.14
INR: 1.23
PROTHROMBIN TIME: 14.6 s (ref 11.4–15.2)
Prothrombin Time: 15.6 seconds — ABNORMAL HIGH (ref 11.4–15.2)

## 2016-08-04 LAB — URINALYSIS, MICROSCOPIC (REFLEX)

## 2016-08-04 LAB — URINALYSIS, ROUTINE W REFLEX MICROSCOPIC
Bilirubin Urine: NEGATIVE
Glucose, UA: NEGATIVE mg/dL
KETONES UR: NEGATIVE mg/dL
Nitrite: POSITIVE — AB
PROTEIN: 30 mg/dL — AB
Specific Gravity, Urine: 1.015 (ref 1.005–1.030)
pH: 6 (ref 5.0–8.0)

## 2016-08-04 LAB — PROCALCITONIN: Procalcitonin: 15.65 ng/mL

## 2016-08-04 LAB — I-STAT CG4 LACTIC ACID, ED: Lactic Acid, Venous: 4.04 mmol/L (ref 0.5–1.9)

## 2016-08-04 LAB — APTT: APTT: 34 s (ref 24–36)

## 2016-08-04 LAB — LACTIC ACID, PLASMA
Lactic Acid, Venous: 2 mmol/L (ref 0.5–1.9)
Lactic Acid, Venous: 1.4 mmol/L (ref 0.5–1.9)

## 2016-08-04 MED ORDER — ACETAMINOPHEN 325 MG PO TABS
650.0000 mg | ORAL_TABLET | Freq: Four times a day (QID) | ORAL | Status: DC | PRN
  Administered 2016-08-04 – 2016-08-05 (×4): 650 mg via ORAL
  Filled 2016-08-04 (×4): qty 2

## 2016-08-04 MED ORDER — VENLAFAXINE HCL 75 MG PO TABS
75.0000 mg | ORAL_TABLET | Freq: Every day | ORAL | Status: DC
  Administered 2016-08-04 – 2016-08-06 (×3): 75 mg via ORAL
  Filled 2016-08-04 (×3): qty 1

## 2016-08-04 MED ORDER — SODIUM CHLORIDE 0.9 % IV BOLUS (SEPSIS)
1000.0000 mL | Freq: Once | INTRAVENOUS | Status: AC
  Administered 2016-08-04: 1000 mL via INTRAVENOUS

## 2016-08-04 MED ORDER — VANCOMYCIN HCL IN DEXTROSE 750-5 MG/150ML-% IV SOLN
750.0000 mg | Freq: Two times a day (BID) | INTRAVENOUS | Status: DC
  Administered 2016-08-04 – 2016-08-05 (×3): 750 mg via INTRAVENOUS
  Filled 2016-08-04 (×4): qty 150

## 2016-08-04 MED ORDER — SODIUM CHLORIDE 0.9% FLUSH
3.0000 mL | Freq: Two times a day (BID) | INTRAVENOUS | Status: DC
  Administered 2016-08-04 – 2016-08-06 (×4): 3 mL via INTRAVENOUS

## 2016-08-04 MED ORDER — SODIUM CHLORIDE 0.9 % IV BOLUS (SEPSIS)
250.0000 mL | Freq: Once | INTRAVENOUS | Status: AC
  Administered 2016-08-04: 250 mL via INTRAVENOUS

## 2016-08-04 MED ORDER — PIPERACILLIN-TAZOBACTAM 3.375 G IVPB 30 MIN
3.3750 g | Freq: Once | INTRAVENOUS | Status: AC
  Administered 2016-08-04: 3.375 g via INTRAVENOUS
  Filled 2016-08-04: qty 50

## 2016-08-04 MED ORDER — VANCOMYCIN HCL IN DEXTROSE 1-5 GM/200ML-% IV SOLN
1000.0000 mg | Freq: Once | INTRAVENOUS | Status: AC
  Administered 2016-08-04: 1000 mg via INTRAVENOUS
  Filled 2016-08-04: qty 200

## 2016-08-04 MED ORDER — LACTATED RINGERS IV SOLN
INTRAVENOUS | Status: DC
  Administered 2016-08-04 – 2016-08-05 (×3): via INTRAVENOUS

## 2016-08-04 MED ORDER — ONDANSETRON HCL 4 MG PO TABS: 4.0000 mg | ORAL_TABLET | Freq: Four times a day (QID) | ORAL | Status: DC | PRN

## 2016-08-04 MED ORDER — TRIAMCINOLONE ACETONIDE 55 MCG/ACT NA AERO
1.0000 | INHALATION_SPRAY | Freq: Every day | NASAL | Status: DC
  Administered 2016-08-04 – 2016-08-05 (×2): 1 via NASAL
  Filled 2016-08-04: qty 10.8

## 2016-08-04 MED ORDER — ACETAMINOPHEN 650 MG RE SUPP: 650.0000 mg | Freq: Four times a day (QID) | RECTAL | Status: DC | PRN

## 2016-08-04 MED ORDER — FAMOTIDINE 20 MG PO TABS
20.0000 mg | ORAL_TABLET | Freq: Two times a day (BID) | ORAL | Status: DC
  Administered 2016-08-04 – 2016-08-06 (×4): 20 mg via ORAL
  Filled 2016-08-04 (×4): qty 1

## 2016-08-04 MED ORDER — MONTELUKAST SODIUM 10 MG PO TABS
10.0000 mg | ORAL_TABLET | Freq: Every day | ORAL | Status: DC
  Administered 2016-08-04 – 2016-08-05 (×2): 10 mg via ORAL
  Filled 2016-08-04 (×2): qty 1

## 2016-08-04 MED ORDER — ENOXAPARIN SODIUM 40 MG/0.4ML ~~LOC~~ SOLN
40.0000 mg | SUBCUTANEOUS | Status: DC
  Administered 2016-08-04 – 2016-08-05 (×2): 40 mg via SUBCUTANEOUS
  Filled 2016-08-04 (×2): qty 0.4

## 2016-08-04 MED ORDER — MOMETASONE FURO-FORMOTEROL FUM 200-5 MCG/ACT IN AERO
2.0000 | INHALATION_SPRAY | Freq: Two times a day (BID) | RESPIRATORY_TRACT | Status: DC
  Administered 2016-08-04 – 2016-08-06 (×4): 2 via RESPIRATORY_TRACT
  Filled 2016-08-04: qty 8.8

## 2016-08-04 MED ORDER — ONDANSETRON HCL 4 MG/2ML IJ SOLN: 4.0000 mg | Freq: Four times a day (QID) | INTRAMUSCULAR | Status: DC | PRN

## 2016-08-04 MED ORDER — ALBUTEROL SULFATE (2.5 MG/3ML) 0.083% IN NEBU: 2.5000 mg | INHALATION_SOLUTION | Freq: Four times a day (QID) | RESPIRATORY_TRACT | Status: DC | PRN

## 2016-08-04 MED ORDER — VENLAFAXINE HCL 75 MG PO TABS: 75.0000 mg | ORAL_TABLET | Freq: Every day | ORAL | Status: DC

## 2016-08-04 MED ORDER — ACETAMINOPHEN 325 MG PO TABS
650.0000 mg | ORAL_TABLET | Freq: Once | ORAL | Status: AC
  Administered 2016-08-04: 650 mg via ORAL
  Filled 2016-08-04: qty 2

## 2016-08-04 MED ORDER — SODIUM CHLORIDE 0.9 % IV BOLUS (SEPSIS)
500.0000 mL | Freq: Once | INTRAVENOUS | Status: AC
  Administered 2016-08-04: 500 mL via INTRAVENOUS

## 2016-08-04 MED ORDER — POLYETHYLENE GLYCOL 3350 17 G PO PACK: 17.0000 g | PACK | Freq: Every day | ORAL | Status: DC | PRN

## 2016-08-04 MED ORDER — VENLAFAXINE HCL ER 75 MG PO CP24: 75.0000 mg | ORAL_CAPSULE | Freq: Every day | ORAL | Status: DC

## 2016-08-04 MED ORDER — PIPERACILLIN-TAZOBACTAM 3.375 G IVPB
3.3750 g | Freq: Three times a day (TID) | INTRAVENOUS | Status: DC
  Administered 2016-08-04 – 2016-08-06 (×5): 3.375 g via INTRAVENOUS
  Filled 2016-08-04 (×6): qty 50

## 2016-08-04 NOTE — Progress Notes (Signed)
CRITICAL VALUE ALERT  Critical Value:  Lactic Acid 2.0  Date & Time Notied:  08/04/16 1520  Provider Notified: Dr Sheran Fava  Orders Received/Actions taken: none

## 2016-08-04 NOTE — Care Management Note (Signed)
Case Management Note  Patient Details  Name: WINFORD HEHN MRN: [id number] Date of Birth: [birth year]  Subjective/Objective: 76 y/o m admitted w/Sepsis. From home.                   Action/Plan:d/c plan home.   Expected Discharge Date:   (unknown)               Expected Discharge Plan:  Home/Self Care  In-House Referral:     Discharge planning Services  CM Consult  Post Acute Care Choice:    Choice offered to:     DME Arranged:    DME Agency:     HH Arranged:    HH Agency:     Status of Service:  In process, will continue to follow  If discussed at Long Length of Stay Meetings, dates discussed:    Additional Comments:  Dessa Phi, RN 08/04/2016, 1:13 PM

## 2016-08-04 NOTE — ED Notes (Signed)
Patient transported to X-ray 

## 2016-08-04 NOTE — Progress Notes (Signed)
Pharmacy Antibiotic Note  Adrian Rios is a 76 y.o. male admitted on 08/04/2016 with sepsis. Pt woke up this morning with diffuse uncontrollable shaking. Pharmacy has been consulted for vancomycin and zosyn dosing.  Plan:  Vancomycin 1gm x1 in ED then 750mg  IV q12h  Zosyn 3.375gm IV x 1 in ED then continue q8h EI  Daily Scr  Follow renal function, cultures, clinical course   Height: 5\' 7"  (170.2 cm) Weight: 162 lb (73.5 kg) IBW/kg (Calculated) : 66.1  Temp (24hrs), Avg:99.7 F (37.6 C), Min:98.4 F (36.9 C), Max:101 F (38.3 C)   Recent Labs Lab 08/04/16 0930 08/04/16 0947  WBC 12.6*  --   CREATININE 1.01  --   LATICACIDVEN  --  4.04*    Estimated Creatinine Clearance: 58.2 mL/min (by C-G formula based on SCr of 1.01 mg/dL).    Allergies  Allergen Reactions  . Ivp Dye [Iodinated Diagnostic Agents] Shortness Of Breath, Itching and Palpitations    "eyes itching,  Heart racing,  Effected breathing"  . Shellfish Allergy Hives, Shortness Of Breath and Itching    Mostly crab  . Gabapentin Other (See Comments)    REACTION: dizziness and flushing  . Niacin-Lovastatin Er Other (See Comments)    Did not feel good on medication  . Adhesive [Tape] Rash    Use paper tape    Antimicrobials this admission: 6/5 vanc >> 6/5 zosyn >>  Microbiology results: 6/5 BCx: sent 6/5 UCx: sent  Thank you for allowing pharmacy to be a part of this patient's care.  Dolly Rias RPh 08/04/2016, 10:27 AM Pager [phone]

## 2016-08-04 NOTE — ED Notes (Signed)
Vital signs at 0915 placed on the wrong patient.

## 2016-08-04 NOTE — H&P (Signed)
History and Physical    Adrian Rios:[id number] DOB: Nov 07, 1940 DOA: 08/04/2016  Referring MD/NP/PA: Dorie Rank PCP: [name], MD   Patient coming from: home  Chief Complaint: rigors, fevers  HPI: Adrian Rios is a 76 y.o. male with history of COPD, pulmonary sarcoidosis, GERD, prostate cancer, bladder cancer diagnosed in February of the year s/p radical cystoprostatectomy in April and without lymph node involvment or need for adjuvant chemotherapy who presents with fever and rigors.  He had his second ureteral stent removed last week and took 3 days of prophylactic antibiotics which he completed on Friday.  On Saturday, he awoke with severe left flank pain which improved with tramadol. He has not had similar pain since. He was feeling well yesterday when he went to bed but then woke up this morning at 6 AM with high fevers, chills, and full body shakes suggestive of Reiter's. He felt very Adrian Rios of breath this morning but was not coughing much. His wife noticed that the urine in his urostomy was more cloudy than usual this morning. He denies abdominal pains. He had some diarrhea last week which she always gets when he takes antibiotics but this is since resolved. He denies upper respiratory symptoms, chest pain or tightness or wheezing.  ED Course: Vital signs temperature 101, pulse initially in the 140s, breathing 32 times per minute, oxygen saturations 89% on room air.  Labs:  CBC 16.5, hemoglobin 10. Initial lactic acid was 4 and trended down to 2 after 2 L of IV fluids.  Chest x-ray demonstrate a mild bilateral pulmonary interstitial prominence but no evidence of lobar pneumonia.  His urinalysis had many bacteria, large leukocyte esterase, positive nitrites, too numerous to count WBCs.  He was started on vancomycin and zosyn for sepsis of indeterminate etiology in the ER.  Review of Systems:  General:  Positive fevers/chills, weight change HEENT:  Denies changes to hearing and vision,  sinus congestion, sore throat CV:  Denies chest pain, palpitations, lower extremity edema.  PULM:  SOB has improved since fevers improved. No significant cough currently.   GI:  Denies nausea, vomiting, constipation, diarrhea.   GU:  Denies dysuria, frequency, urgency ENDO:  Denies polyuria, polydipsia.  uop is stable.   HEME:  Denies blood in stools, abnormal bruising or bleeding.  LYMPH:  Denies lymphadenopathy.   MSK:  Denies arthralgias, myalgias.   DERM:  Denies skin rash or ulcer.   NEURO:  Denies new focal numbness or weakness, slurred speech, confusion PSYCH:  Denies anxiety and depression.    Past Medical History:  Diagnosis Date  . Allergic rhinitis   . Arthritis   . Benign localized prostatic hyperplasia with lower urinary tract symptoms (LUTS)   . Bladder tumor   . Borderline glaucoma of right eye   . Cancer [facility])    bladder and prostate  . Carotid bruit    per duplex 03-11-2016 RICA 1-39%  . Chronic throat clearing   . DDD (degenerative disc disease), lumbar   . Depression   . ED (erectile dysfunction)   . Elevated PSA    urologist-  dr [name]--- s/p  prostate bx's  . GERD (gastroesophageal reflux disease)   . Hematuria   . History of chronic bronchitis   . 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 History of squamous cell carcinoma excision   . History of squamous cell carcinoma excision    several times  .  Hyperlipidemia   . Pre-diabetes   . Premature ventricular contractions (PVCs) (VPCs)   . RAD (reactive airway disease)   . 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 Sensorineural hearing loss (SNHL) of both ears   . Wears glasses   . Wears hearing aid    bilateral    Past Surgical History:  Procedure Laterality Date  . APPENDECTOMY  2008  . CARDIOVASCULAR STRESS TEST  05/27/2010   normal nuclear study w/ no ischemia/  normal LV function  and wall motion , ef 60%  . CYSTOSCOPY WITH INJECTION N/A 06/12/2016   Procedure: CYSTOSCOPY WITH INJECTION OF INDOCYANINE GREEN DYE;  Surgeon: Alexis Frock, MD;  Location: [facility];  Service: Urology;  Laterality: N/A;  . DEEP NECK LYMPH NODE BIOPSY / EXCISION  1970's   and Bronchoscopy w/ bx's ( dx Sarcoidosis)  . ILEOSTOMY    . MOHS SURGERY  2013 approx.    nasal; pre melanoma  . PARS PLANA VITRECTOMY Right 2007   repair macular pucker  . TONSILLECTOMY AND ADENOIDECTOMY  child  . TRANSURETHRAL RESECTION OF BLADDER TUMOR N/A 04/06/2016   Procedure: CYSTOSCOPY TRANSURETHRAL RESECTION OF BLADDER TUMORS (TURBT);  Surgeon: Carolan Clines, MD;  Location: [facility];  Service: Urology;  Laterality: N/A;  . TRANSURETHRAL RESECTION OF PROSTATE       reports that he has never smoked. He has never used smokeless tobacco. He reports that he does not drink alcohol or use drugs.  Allergies  Allergen Reactions  . Ivp Dye [Iodinated Diagnostic Agents] Shortness Of Breath, Itching and Palpitations    "eyes itching,  Heart racing,  Effected breathing"  . Shellfish Allergy Hives, Shortness Of Breath and Itching    Mostly crab  . Gabapentin Other (See Comments)    REACTION: dizziness and flushing  . Niacin-Lovastatin Er Other (See Comments)    Did not feel good on medication  . Adhesive [Tape] Rash    Use paper tape    Family History  Problem Relation Age of Onset  . Stroke Mother        in her 21s  . Breast cancer Sister   . Heart disease Maternal Grandmother 84       MI   . Breast cancer Paternal Grandmother   . Heart disease Paternal Grandfather 60       MI  . Stroke Brother   . Diabetes Neg Hx     Prior to Admission medications   Medication Sig Start Date End Date Taking? Authorizing Provider  albuterol (PROVENTIL HFA;VENTOLIN HFA) 108 (90 Base) MCG/ACT inhaler Inhale 2 puffs into the lungs every 6 (six) hours as needed for wheezing or shortness of breath. 08/27/15   Yes Burns, Claudina Lick, MD  Fluticasone-Salmeterol (ADVAIR DISKUS) 250-50 MCG/DOSE AEPB INHALE ONE DOSE BY MOUTH EVERY 12 HOURS 04/20/16  Yes Burns, Claudina Lick, MD  magnesium oxide (MAG-OX) 400 MG tablet Take 400 mg by mouth daily.   Yes [provider]  Misc Natural Products (GLUCOSAMINE CHOND DOUBLE STR PO) Take 1 tablet by mouth 2 (two) times daily.   Yes [provider]  montelukast (SINGULAIR) 10 MG tablet Take 1 tablet (10 mg total) by mouth daily. Patient taking differently: Take 10 mg by mouth at bedtime.  06/18/15  Yes Burns, Claudina Lick, MD  ranitidine (ZANTAC) 150 MG tablet TAKE ONE TABLET BY MOUTH AT BEDTIME  07/01/16  Yes Burns, Claudina Lick, MD  triamcinolone (NASACORT ALLERGY 24HR) 55 MCG/ACT AERO nasal inhaler Place 1 spray into the nose at bedtime.   Yes [provider]  venlafaxine (EFFEXOR) 75 MG tablet TAKE ONE TABLET BY MOUTH ONCE DAILY 06/25/16  Yes [name], MD    Physical Exam: Vitals:   08/04/16 1000 08/04/16 1113 08/04/16 1200 08/04/16 1247  BP: 119/69 108/66 108/64 104/67  Pulse: (!) 130 (!) 110 (!) 103 (!) 101  Resp: (!) 30 (!) 32 (!) 30 (!) 25  Temp:    (!) 100.4 F (38 C)  TempSrc:    Oral  SpO2: (!) 89% 91% 94% 96%  Weight:    73 kg (160 lb 15 oz)  Height:    5\' 7"  (1.702 m)    Constitutional:  NAD, calm, comfortable Eyes: PERRL, lids and conjunctivae normal ENMT:  Moist mucous membranes.  Oropharynx nonerythematous, no exudates.  Nasal canula in place Neck:  No nuchal rigidity, no masses Respiratory:  Diminished at the bilateral bases, thought I heard some rales at the right midback but they cleared with repeat respirations.  No wheezes or rhonchi. Cardiovascular:  Tachycardic, regular rhythm, no murmurs / rubs / gallops.  2+ radial pulses. Abdomen:  Normal active bowel sounds, soft, nondistended, nontender.  Urostomy on mid-abd with somewhat cloudy urine.   Musculoskeletal: Normal muscle tone and bulk.  No contractures.  Skin:  no rashes,  abrasions, or ulcers Neurologic:  CN 2-12 grossly intact. Sensation intact to light touch, strength 5/5 throughout Psychiatric:  Alert and oriented x 3. Normal affect.   Labs on Admission: I have personally reviewed following labs and imaging studies  CBC:  Recent Labs Lab 08/04/16 0930 08/04/16 1438  WBC 12.6* 16.5*  NEUTROABS 11.1* 15.1*  HGB 12.3* 10.0*  HCT 36.2* 30.3*  MCV 87.2 88.6  PLT 390 833   Basic Metabolic Panel:  Recent Labs Lab 08/04/16 0930  NA 140  K 3.8  CL 109  CO2 17*  GLUCOSE 118*  BUN 17  CREATININE 1.01  CALCIUM 8.8*   GFR: Estimated Creatinine Clearance: 58.2 mL/min (by C-G formula based on SCr of 1.01 mg/dL). Liver Function Tests:  Recent Labs Lab 08/04/16 0930  AST 28  ALT 17  ALKPHOS 138*  BILITOT 1.0  PROT 7.3  ALBUMIN 3.3*   No results for input(s): LIPASE, AMYLASE in the last 168 hours. No results for input(s): AMMONIA in the last 168 hours. Coagulation Profile:  Recent Labs Lab 08/04/16 0930 08/04/16 1438  INR 1.14 1.23   Cardiac Enzymes: No results for input(s): CKTOTAL, CKMB, CKMBINDEX, TROPONINI in the last 168 hours. BNP (last 3 results) No results for input(s): PROBNP in the last 8760 hours. HbA1C: No results for input(s): HGBA1C in the last 72 hours. CBG: No results for input(s): GLUCAP in the last 168 hours. Lipid Profile: No results for input(s): CHOL, HDL, LDLCALC, TRIG, CHOLHDL, LDLDIRECT in the last 72 hours. Thyroid Function Tests: No results for input(s): TSH, T4TOTAL, FREET4, T3FREE, THYROIDAB in the last 72 hours. Anemia Panel: No results for input(s): VITAMINB12, FOLATE, FERRITIN, TIBC, IRON, RETICCTPCT in the last 72 hours. Urine analysis:    Component Value Date/Time   COLORURINE YELLOW 08/04/2016 0955   APPEARANCEUR TURBID (A) 08/04/2016 0955   LABSPEC 1.015 08/04/2016 0955   PHURINE 6.0 08/04/2016 0955   GLUCOSEU NEGATIVE 08/04/2016 0955   HGBUR LARGE (A) 08/04/2016 0955   HGBUR negative  07/11/2009 0813   BILIRUBINUR NEGATIVE 08/04/2016  [place] 08/04/2016 0955   PROTEINUR 30 (A) 08/04/2016 0955   UROBILINOGEN 0.2 07/11/2009 0813   NITRITE POSITIVE (A) 08/04/2016 0955   LEUKOCYTESUR LARGE (A) 08/04/2016 0955   Sepsis Labs: @LABRCNTIP (procalcitonin:4,lacticidven:4) )No results found for this or any previous visit (from the past 240 hour(s)).   Radiological Exams on Admission: Dg Chest 2 View  Result Date: 08/04/2016 CLINICAL DATA:  Fever and chills. EXAM: CHEST  2 VIEW COMPARISON:  05/16/2016 . FINDINGS: Mediastinum and hilar structures normal. Heart size normal. Bilateral pulmonary interstitial prominence noted consistent mild pneumonitis. No pleural effusion or pneumothorax. IMPRESSION: Mild bilateral pulmonary interstitial prominence noted consistent with mild pneumonitis. Electronically Signed   By: Marcello Moores  Register   On: 08/04/2016 10:30    EKG: Independently reviewed.  Sinus tachycardia in the 130s, no acute ischemia.  QTc 479msec  Assessment/Plan Principal Problem:   Sepsis ([place]) Active Problems:   Sarcoidosis ([facility])   Depression   RAD (reactive airway disease)   Esophageal reflux   Bladder cancer ([facility])   Sepsis (fever, tachycardia, tachypnea, elevated lactic acid) likely secondary to UTI, less likely due to pneumonia -  F/u blood cultures -  Trend lactic acid -  F/u urine culture -  Vancomycin and zosyn -  Consider discontinue vanco if procalcitonin low, no evidence of pneumonia on repeat CXR  Acute respiratory failure with hypoxia was likely transient and due to sepsis/high fever -  Transition to room air as tolerated, keeping O2 > 92%  COPD/sarcoidosis, currently breathing comfortably -  Continue albuterol, advair equivalent, and singulair  Bladder cancer s/p resection, hx of pr CA -  Ostomy care  -  Sent Dr. Tresa Moore message in EPIC to alert of admission, but no acute issues at this time  GERD, stable -  Continue H2  blocker  Depression/anxiety -  Continue venlafaxine  DVT prophylaxis: lovenox  Code Status: full Family Communication:  Patient and wife who was present at bedside Disposition Plan: likely home in 2 days Consults called:  Sent message in EPIC to alert Dr. Tresa Moore of admission  Admission status: inpatient, telemetry    Araceli Arango, Adrian Delaine MD [facility] Pager [phone]  If 7PM-7AM, please contact night-coverage [email or web address] Password [facility]  08/04/2016, 3:38 PM

## 2016-08-04 NOTE — ED Notes (Signed)
0918 primary assessment charated on the wrong patient.

## 2016-08-04 NOTE — ED Provider Notes (Signed)
[facility] DEPT Provider Note   CSN: [id number] Arrival date & time: 08/04/16  0911     History   Chief Complaint Fever, rigors  HPI Adrian Rios is a 76 y.o. male.  HPI Patient presents to the emergency room for evaluation of shaking chills.  She states he woke up this morning with diffuse uncontrollable shaking. He felt very weak. His temperature at home was 100.1. In the last few days he has been having some trouble with generalized body aches and coughing. He denies any trouble with vomiting or diarrhea. He denies any dysuria however he has a diverting urostomy and does not urinate normally anymore.  Patient denies any rashes. He had some back pain previously but denies any flank pain currently. He initially was going to see his urologist this morning at 9:30 but he felt too weak so he came to the emergency room. Past Medical History:  Diagnosis Date  . Allergic rhinitis   . Arthritis   . Benign localized prostatic hyperplasia with lower urinary tract symptoms (LUTS)   . Bladder tumor   . Borderline glaucoma of right eye   . Cancer [facility])    bladder and prostate  . Carotid bruit    per duplex 03-11-2016 RICA 1-39%  . Chronic throat clearing   . DDD (degenerative disc disease), lumbar   . Depression   . ED (erectile dysfunction)   . Elevated PSA    urologist-  dr [name]--- s/p  prostate bx's  . GERD (gastroesophageal reflux disease)   . Hematuria   . History of chronic bronchitis   . 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 History of squamous cell carcinoma excision   . History of squamous cell carcinoma excision    several times  . Hyperlipidemia   . Pre-diabetes   . Premature ventricular contractions (PVCs) (VPCs)   . RAD (reactive airway disease)   . 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 Sensorineural hearing loss (SNHL) of both ears   . Wears  glasses   . Wears hearing aid    bilateral    Patient Active Problem List   Diagnosis Date Noted  . Skin erythema 06/23/2016  . History of bladder cancer 06/18/2016  . History of prostate cancer 06/18/2016  . 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 Bladder cancer ([place]) 06/11/2016  . Hypotension 05/16/2016  . Fever 05/16/2016  . Carotid bruit 02/17/2016  . Prediabetes 08/20/2015  . Overweight (BMI 25.0-29.9) 08/20/2015  . Esophageal reflux 08/20/2015  . Glaucoma 02/14/2015  . Varicose veins of lower extremities with other complications 95/62/1308  . RAD (reactive airway disease) 10/27/2011  . 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 Anemia, unspecified 10/27/2011  . 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 ERECTILE DYSFUNCTION 04/23/2010  . [place] DISEASE, LUMBOSACRAL SPINE 02/06/2010  . 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 Depression 04/12/2008  . Allergic rhinitis 04/12/2008  . ELEVATED PROSTATE SPECIFIC ANTIGEN 04/12/2008  . Hyperlipidemia 10/05/2006  . Sarcoidosis ([place]) 07/05/2006    Past Surgical History:  Procedure Laterality Date  . APPENDECTOMY  2008  . CARDIOVASCULAR STRESS TEST  05/27/2010   normal nuclear study w/ no ischemia/  normal LV function and wall motion , ef 60%  . CYSTOSCOPY WITH INJECTION N/A 06/12/2016   Procedure: CYSTOSCOPY WITH INJECTION OF INDOCYANINE GREEN DYE;  Surgeon:  Alexis Frock, MD;  Location: [facility]S;  Service: Urology;  Laterality: N/A;  . DEEP NECK LYMPH NODE BIOPSY / EXCISION  1970's   and Bronchoscopy w/ bx's ( dx Sarcoidosis)  . ILEOSTOMY    . MOHS SURGERY  2013 approx.    nasal; pre melanoma  . PARS PLANA VITRECTOMY Right 2007   repair macular pucker  . TONSILLECTOMY AND ADENOIDECTOMY  child  . TRANSURETHRAL RESECTION OF BLADDER TUMOR N/A 04/06/2016   Procedure: CYSTOSCOPY TRANSURETHRAL RESECTION OF BLADDER TUMORS (TURBT);  Surgeon: Carolan Clines, MD;  Location: [facility];  Service: Urology;   Laterality: N/A;  . TRANSURETHRAL RESECTION OF PROSTATE         Home Medications    Prior to Admission medications   Medication Sig Start Date End Date Taking? Authorizing Provider  albuterol (PROVENTIL HFA;VENTOLIN HFA) 108 (90 Base) MCG/ACT inhaler Inhale 2 puffs into the lungs every 6 (six) hours as needed for wheezing or shortness of breath. 08/27/15  Yes Burns, Claudina Lick, MD  Fluticasone-Salmeterol (ADVAIR DISKUS) 250-50 MCG/DOSE AEPB INHALE ONE DOSE BY MOUTH EVERY 12 HOURS 04/20/16  Yes Burns, Claudina Lick, MD  magnesium oxide (MAG-OX) 400 MG tablet Take 400 mg by mouth daily.   Yes [provider]  Misc Natural Products (GLUCOSAMINE CHOND DOUBLE STR PO) Take 1 tablet by mouth 2 (two) times daily.   Yes [provider]  montelukast (SINGULAIR) 10 MG tablet Take 1 tablet (10 mg total) by mouth daily. Patient taking differently: Take 10 mg by mouth at bedtime.  06/18/15  Yes Burns, Claudina Lick, MD  ranitidine (ZANTAC) 150 MG tablet TAKE ONE TABLET BY MOUTH AT BEDTIME 07/01/16  Yes Burns, Claudina Lick, MD  triamcinolone (NASACORT ALLERGY 24HR) 55 MCG/ACT AERO nasal inhaler Place 1 spray into the nose at bedtime.   Yes [provider]  venlafaxine (EFFEXOR) 75 MG tablet TAKE ONE TABLET BY MOUTH ONCE DAILY 06/25/16  Yes Burns, Claudina Lick, MD    Family History Family History  Problem Relation Age of Onset  . Stroke Mother        in her 2s  . Breast cancer Sister   . Heart disease Maternal Grandmother 84       MI   . Breast cancer Paternal Grandmother   . Heart disease Paternal Grandfather 7       MI  . Stroke Brother   . Diabetes Neg Hx     Social History Social History  Substance Use Topics  . Smoking status: Never Smoker  . Smokeless tobacco: Never Used  . Alcohol use No     Allergies   Ivp dye [iodinated diagnostic agents]; Shellfish allergy; Gabapentin; Niacin-lovastatin er; and Adhesive [tape]   Review of Systems Review of Systems  All other systems  reviewed and are negative.    Physical Exam Updated Vital Signs BP 108/66   Pulse (!) 110   Temp (!) 101 F (38.3 C)   Resp (!) 32   Ht 1.702 m (5\' 7" )   Wt 73.5 kg (162 lb)   SpO2 91%   BMI 25.37 kg/m   Physical Exam  HENT:  Head: Normocephalic and atraumatic.  Right Ear: External ear normal.  Left Ear: External ear normal.  Eyes: Conjunctivae are normal. Right eye exhibits no discharge. Left eye exhibits no discharge. No scleral icterus.  Neck: Neck supple. No tracheal deviation present.  Cardiovascular: Regular rhythm and intact distal pulses.  Tachycardia present.   Pulmonary/Chest: Effort normal and  breath sounds normal. No stridor. No respiratory distress. He has no wheezes. He has no rales.  Abdominal: Soft. Bowel sounds are normal. He exhibits no distension. There is no tenderness. There is no rebound and no guarding.  Cloudy-appearing urine in the urostomy bag  Musculoskeletal: He exhibits no edema or tenderness.  Neurological: He is alert. He has normal strength. No cranial nerve deficit (no facial droop, extraocular movements intact, no slurred speech) or sensory deficit. He exhibits normal muscle tone. He displays no seizure activity. Coordination normal.  Skin: Skin is warm and dry. No rash noted. He is not diaphoretic.  Psychiatric: He has a normal mood and affect.  Nursing note and vitals reviewed.    ED Treatments / Results  Labs (all labs ordered are listed, but only abnormal results are displayed) Labs Reviewed  COMPREHENSIVE METABOLIC PANEL - Abnormal; Notable for the following:       Result Value   CO2 17 (*)    Glucose, Bld 118 (*)    Calcium 8.8 (*)    Albumin 3.3 (*)    Alkaline Phosphatase 138 (*)    All other components within normal limits  CBC WITH DIFFERENTIAL/PLATELET - Abnormal; Notable for the following:    WBC 12.6 (*)    RBC 4.15 (*)    Hemoglobin 12.3 (*)    HCT 36.2 (*)    Neutro Abs 11.1 (*)    All other components within  normal limits  URINALYSIS, ROUTINE W REFLEX MICROSCOPIC - Abnormal; Notable for the following:    APPearance TURBID (*)    Hgb urine dipstick LARGE (*)    Protein, ur 30 (*)    Nitrite POSITIVE (*)    Leukocytes, UA LARGE (*)    All other components within normal limits  BLOOD GAS, ARTERIAL - Abnormal; Notable for the following:    pH, Arterial 7.488 (*)    pCO2 arterial 28.1 (*)    pO2, Arterial 68.3 (*)    All other components within normal limits  URINALYSIS, MICROSCOPIC (REFLEX) - Abnormal; Notable for the following:    Bacteria, UA MANY (*)    Squamous Epithelial / LPF 0-5 (*)    All other components within normal limits  I-STAT CG4 LACTIC ACID, ED - Abnormal; Notable for the following:    Lactic Acid, Venous 4.04 (*)    All other components within normal limits  CULTURE, BLOOD (ROUTINE X 2)  CULTURE, BLOOD (ROUTINE X 2)  URINE CULTURE  PROTIME-INR  I-STAT CG4 LACTIC ACID, ED    EKG  EKG Interpretation  Date/Time:  Tuesday August 04 2016 09:25:19 EDT Ventricular Rate:  139 PR Interval:    QRS Duration: 89 QT Interval:  276 QTC Calculation: 420 R Axis:   83 Text Interpretation:  Sinus tachycardia Borderline right axis deviation Since last tracing rate faster Confirmed by Dorie Rank [phone]) on 08/04/2016 10:11:42 AM       Radiology Dg Chest 2 View  Result Date: 08/04/2016 CLINICAL DATA:  Fever and chills. EXAM: CHEST  2 VIEW COMPARISON:  05/16/2016 . FINDINGS: Mediastinum and hilar structures normal. Heart size normal. Bilateral pulmonary interstitial prominence noted consistent mild pneumonitis. No pleural effusion or pneumothorax. IMPRESSION: Mild bilateral pulmonary interstitial prominence noted consistent with mild pneumonitis. Electronically Signed   By: Marcello Moores  Register   On: 08/04/2016 10:30    Procedures .Critical Care Performed by: Dorie Rank Authorized by: Dorie Rank   Critical care provider statement:    Critical care time (minutes):  40  Critical care  was necessary to treat or prevent imminent or life-threatening deterioration of the following conditions:  Sepsis   Critical care was time spent personally by me on the following activities:  Discussions with consultants, evaluation of patient's response to treatment, examination of patient, ordering and performing treatments and interventions, ordering and review of laboratory studies, ordering and review of radiographic studies, pulse oximetry, re-evaluation of patient's condition, obtaining history from patient or surrogate and review of old charts   (including critical care time)  Medications Ordered in ED Medications  piperacillin-tazobactam (ZOSYN) IVPB 3.375 g (not administered)  vancomycin (VANCOCIN) IVPB 750 mg/150 ml premix (not administered)  lactated ringers infusion (not administered)  sodium chloride 0.9 % bolus 1,000 mL (0 mLs Intravenous Stopped 08/04/16 1035)    And  sodium chloride 0.9 % bolus 1,000 mL (0 mLs Intravenous Stopped 08/04/16 1034)    And  sodium chloride 0.9 % bolus 250 mL (0 mLs Intravenous Stopped 08/04/16 1014)  piperacillin-tazobactam (ZOSYN) IVPB 3.375 g (0 g Intravenous Stopped 08/04/16 1032)  vancomycin (VANCOCIN) IVPB 1000 mg/200 mL premix (0 mg Intravenous Stopped 08/04/16 1102)  acetaminophen (TYLENOL) tablet 650 mg (650 mg Oral Given 08/04/16 1115)     Initial Impression / Assessment and Plan / ED Course  I have reviewed the triage vital signs and the nursing notes.  Pertinent labs & imaging results that were available during my care of the patient were reviewed by me and considered in my medical decision making (see chart for details).  Clinical Course as of Aug 04 1129  Tue Aug 04, 2016  1050 No def pna on CXR.  Possible pneumonitis.  With his decreased o2 sat I suspect pulmonary source of infection but no definite findings on CXR.   Still tachycardic but fluid infusing.  Continue to monitor.  Being treated for sepsis  [JK]  1126 HR is improving with  treatment.  HR now to 110.   BP remains stable.  [JK]    Clinical Course User Index [JK] Dorie Rank, MD   Patient presented to the emergency room with complaints of rigors.  The patient was noted to have a temperature up to 101 here in the emergency room. He is also hypoxic on pulse ox.  Chest x-ray does not show pneumonia but does suggest pneumonitis. Patient also has a new partner. I am suspicious for a pulmonary source of infection. His urinalysis does show too numerous to count white blood cells and many bacteria however this is a sample from a diverting urostomy. Patient has been started on broad-spectrum antibiotics for sepsis. His blood pressure remained stable and his heart rate is improving after IV fluid hydration. I will consult the medical service for admission and further treatment   Final Clinical Impressions(s) / ED Diagnoses   Final diagnoses:  Sepsis, due to unspecified organism [facility])      Dorie Rank, MD 08/04/16 1131

## 2016-08-04 NOTE — ED Triage Notes (Addendum)
Per pt, states he had surgery in April-states he woke up with a fever this am-chills, uncontrollable shaking-states he feels like he is going to pass out

## 2016-08-04 NOTE — ED Notes (Signed)
Date and time results received: 08/04/16 0952  Test: iStat Lactic Acid Critical Value: 4.04 mmol/L  Name of Provider Notified: Tomi Bamberger  Orders Received? Or Actions Taken?: Orders Received - See Orders for details

## 2016-08-05 ENCOUNTER — Inpatient Hospital Stay (HOSPITAL_COMMUNITY): Payer: Medicare Other

## 2016-08-05 DIAGNOSIS — A419 Sepsis, unspecified organism: Secondary | ICD-10-CM

## 2016-08-05 DIAGNOSIS — E440 Moderate protein-calorie malnutrition: Secondary | ICD-10-CM | POA: Insufficient documentation

## 2016-08-05 LAB — CBC
HCT: 29.8 % — ABNORMAL LOW (ref 39.0–52.0)
Hemoglobin: 9.8 g/dL — ABNORMAL LOW (ref 13.0–17.0)
MCH: 29.3 pg (ref 26.0–34.0)
MCHC: 32.9 g/dL (ref 30.0–36.0)
MCV: 89.2 fL (ref 78.0–100.0)
Platelets: 326 10*3/uL (ref 150–400)
RBC: 3.34 MIL/uL — ABNORMAL LOW (ref 4.22–5.81)
RDW: 13 % (ref 11.5–15.5)
WBC: 10.2 10*3/uL (ref 4.0–10.5)

## 2016-08-05 LAB — BASIC METABOLIC PANEL WITH GFR
Anion gap: 4 — ABNORMAL LOW (ref 5–15)
BUN: 11 mg/dL (ref 6–20)
CO2: 25 mmol/L (ref 22–32)
Calcium: 8.1 mg/dL — ABNORMAL LOW (ref 8.9–10.3)
Chloride: 114 mmol/L — ABNORMAL HIGH (ref 101–111)
Creatinine, Ser: 0.87 mg/dL (ref 0.61–1.24)
GFR calc Af Amer: >60 mL/min
GFR calc non Af Amer: >60 mL/min
Glucose, Bld: 106 mg/dL — ABNORMAL HIGH (ref 65–99)
Potassium: 3.4 mmol/L — ABNORMAL LOW (ref 3.5–5.1)
Sodium: 143 mmol/L (ref 135–145)

## 2016-08-05 MED ORDER — ADULT MULTIVITAMIN W/MINERALS CH
1.0000 | ORAL_TABLET | Freq: Every day | ORAL | Status: DC
  Administered 2016-08-05 – 2016-08-06 (×3): 1 via ORAL
  Filled 2016-08-05 (×2): qty 1

## 2016-08-05 NOTE — Progress Notes (Signed)
Initial Nutrition Assessment  DOCUMENTATION CODES:   Non-severe (moderate) malnutrition in context of acute illness/injury  INTERVENTION:  - Will order Carn[employer] Instant Breakfast to be mixed with whole milk BID, each supplementation will provide 240 kcal and 13 grams of protein. - Will order daily multivitamin with minerals. - Continue to encourage PO intakes of meals and supplement.   NUTRITION DIAGNOSIS:   Malnutrition (moderate/non-severe) related to acute illness, poor appetite (s/p radical cystoprostatectomy in April) as evidenced by mild depletion of muscle mass, percent weight loss.  GOAL:   Patient will meet greater than or equal to 90% of their needs  MONITOR:   PO intake, Supplement acceptance, Weight trends, Labs  REASON FOR ASSESSMENT:   Malnutrition Screening Tool  ASSESSMENT:   76 y.o. male with history of COPD, pulmonary sarcoidosis, GERD, prostate cancer, bladder cancer diagnosed in February of the year s/p radical cystoprostatectomy in April and without lymph node involvment or need for adjuvant chemotherapy who presents with fever and rigors.  He had his second ureteral stent removed last week and took 3 days of prophylactic antibiotics which he completed on Friday.  On Saturday, he awoke with severe left flank pain which improved with tramadol. He has not had similar pain since. He was feeling well yesterday when he went to bed but then woke up this morning at 6 AM with high fevers, chills, and full body shakes suggestive of Reiter's.  Pt seen for MST. BMI indicates overweight status, appropriate for age. Per chart review, pt consumed 50% of dinner last night and 15% of breakfast this AM. He does not want anything for lunch. He has several snacks at bedside that have been brought in by family members but is not interested in any of these items at this time. Pt reports that since radical cystoprostatectomy eight weight ago he has had a decreased appetite. Prior to  surgery he had a very good appetite and states that he always found it hard to believe that anyone could have a poor appetite. Denies abdominal pain or nausea with or without eating. He states that desire to eat is decrease and he has also experienced decreased taste sensation (balnd taste) even with favorite foods.  He reports his wife is a Systems developer and he gives the example that, despite this, he is only able to eat 1/2 of an 8 ounce steak before feeling too full. Encouraged pt to add butter, sauces, and gravies to items to increase kcal without adding extra volume. 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 Ice cream has been easy for him to consume; encouraged addition of peanut butter and/or protein powder to increase protein content. Also encouraged purchase of unflavored protein powder to add to very soft or liquid-consistency food items.   Pt denies any additional questions or concerns at this time and states that he knows he needs to eat better but is having a difficult time.  Physical assessment shows mild muscle wasting to lower leg, shoulder, and clavicle. No fat wasting at this time. Pt reports 14 lb weight loss since his surgery. This is consistent with weights in the chart and indicates 7.5% body weight loss in 2 months which is significant.  Medications reviewed; 20 mg oral Pepcid BID Labs reviewed; K: 3.4 mmol/L, Cl: 114 mmol/L, Ca: 8.1 mg/dL.      Diet Order:  Diet regular Room service appropriate? Yes; Fluid consistency: Thin  Skin:  Reviewed, no issues  Last  BM:  6/6  Height:   Ht Readings from Last 1 Encounters:  08/04/16 5\' 7"  (1.702 m)    Weight:   Wt Readings from Last 1 Encounters:  08/04/16 160 lb 15 oz (73 kg)    Ideal Body Weight:  67.27 kg  BMI:  Body mass index is 25.21 kg/m.  Estimated Nutritional Needs:   Kcal:  2045-2265 (28-31 kcal/kg)  Protein:  90-105 grams (~1.2-1.4 grams/kg)  Fluid:  >/= 2  L/day  EDUCATION NEEDS:   Education needs addressed    Jarome Matin, MS, RD, LDN, CNSC Inpatient Clinical Dietitian Pager # [phone] After hours/weekend pager # [phone]

## 2016-08-05 NOTE — Progress Notes (Signed)
PROGRESS NOTE    Adrian Rios  KDT:[id number] DOB: 04/22/1940 DOA: 08/04/2016 PCP: [name], MD    Brief Narrative:  76 y.o. male with history of COPD, pulmonary sarcoidosis, GERD, prostate cancer, bladder cancer diagnosed in February of the year s/p radical cystoprostatectomy in April and without lymph node involvment or need for adjuvant chemotherapy who presents with fever and rigors.  He had his second ureteral stent removed last week and took 3 days of prophylactic antibiotics which he completed on Friday.  On Saturday, he awoke with severe left flank pain which improved with tramadol. He has not had similar pain since. He was feeling well yesterday when he went to bed but then woke up this morning at 6 AM with high fevers, chills, and full body shakes suggestive of Reiter's. He felt very short of breath this morning but was not coughing much. His wife noticed that the urine in his urostomy was more cloudy than usual this morning. He denies abdominal pains. He had some diarrhea last week which she always gets when he takes antibiotics but this is since resolved. He denies upper respiratory symptoms, chest pain or tightness or wheezing.  Assessment & Plan:   Principal Problem:   Sepsis ([place]) Active Problems:   Sarcoidosis ([place])   Depression   RAD (reactive airway disease)   Esophageal reflux   Bladder cancer ([facility])  Sepsis (fever, tachycardia, tachypnea, elevated lactic acid) likely secondary to UTI -  Blood cultures ordered, pending -  Trend lactic acid - Follow urine culture -  Continue Vancomycin and zosyn -  Clinically improved. Cont current regimen, pending culture results  Acute respiratory failure with hypoxia was likely transient and due to sepsis/high fever -  Improved since admission  COPD/sarcoidosis, currently breathing comfortably -  Continue albuterol, advair equivalent, and singulair - Stable at present  Bladder cancer s/p resection, hx of pr CA -  Ostomy  care  -  Dr. Tresa Moore was made aware of patient's admission. - Levada Dy stable this time  GERD, stable -  Continue H2 blocker -Remains stable  Depression/anxiety -  Continue venlafaxine -Presently stable  DVT prophylaxis: Lovenox subcutaneous Code Status: Full code Family Communication: Patient in room, family not at bedside Disposition Plan: Uncertain at this time  Consultants:     Procedures:     Antimicrobials: Anti-infectives    Start     Dose/Rate Route Frequency Ordered Stop   08/04/16 2200  vancomycin (VANCOCIN) IVPB 750 mg/150 ml premix     750 mg 150 mL/hr over 60 Minutes Intravenous Every 12 hours 08/04/16 1029     08/04/16 1800  piperacillin-tazobactam (ZOSYN) IVPB 3.375 g     3.375 g 12.5 mL/hr over 240 Minutes Intravenous Every 8 hours 08/04/16 1029     08/04/16 1000  piperacillin-tazobactam (ZOSYN) IVPB 3.375 g     3.375 g 100 mL/hr over 30 Minutes Intravenous  Once 08/04/16 0952 08/04/16 1032   08/04/16 1000  vancomycin (VANCOCIN) IVPB 1000 mg/200 mL premix     1,000 mg 200 mL/hr over 60 Minutes Intravenous  Once 08/04/16 9983 08/04/16 1102       Subjective: Patient reports feeling much better today. Questioning about going home  Objective: Vitals:   08/05/16 0627 08/05/16 0855 08/05/16 0858 08/05/16 1344  BP: 104/70   107/68  Pulse: 69   (!) 59  Resp: 18   18  Temp: 99.2 F (37.3 C)   98.7 F (37.1 C)  TempSrc: Oral   Oral  SpO2: 100% 97% 97% 98%  Weight:      Height:        Intake/Output Summary (Last 24 hours) at 08/05/16 1735 Last data filed at 08/05/16 0953  Gross per 24 hour  Intake          2341.25 ml  Output             1550 ml  Net           791.25 ml   Filed Weights   08/04/16 0955 08/04/16 1247  Weight: 73.5 kg (162 lb) 73 kg (160 lb 15 oz)    Examination:  General exam: Appears calm and comfortable  Respiratory system: Clear to auscultation. Respiratory effort normal. Cardiovascular system: S1 & S2 heard, RRR. No  JVD, murmurs, rubs, gallops or clicks. No pedal edema. Gastrointestinal system: Abdomen is nondistended, soft and nontender. No organomegaly or masses felt. Normal bowel sounds heard. Central nervous system: Alert and oriented. No focal neurological deficits. Extremities: Symmetric 5 x 5 power. Skin: No rashes, lesions  Psychiatry: Judgement and insight appear normal. Mood & affect appropriate.   Data Reviewed: I have personally reviewed following labs and imaging studies  CBC:  Recent Labs Lab 08/04/16 0930 08/04/16 1438 08/05/16 0537  WBC 12.6* 16.5* 10.2  NEUTROABS 11.1* 15.1*  --   HGB 12.3* 10.0* 9.8*  HCT 36.2* 30.3* 29.8*  MCV 87.2 88.6 89.2  PLT 390 341 637   Basic Metabolic Panel:  Recent Labs Lab 08/04/16 0930 08/05/16 0537  NA 140 143  K 3.8 3.4*  CL 109 114*  CO2 17* 25  GLUCOSE 118* 106*  BUN 17 11  CREATININE 1.01 0.87  CALCIUM 8.8* 8.1*   GFR: Estimated Creatinine Clearance: 67.5 mL/min (by C-G formula based on SCr of 0.87 mg/dL). Liver Function Tests:  Recent Labs Lab 08/04/16 0930  AST 28  ALT 17  ALKPHOS 138*  BILITOT 1.0  PROT 7.3  ALBUMIN 3.3*   No results for input(s): LIPASE, AMYLASE in the last 168 hours. No results for input(s): AMMONIA in the last 168 hours. Coagulation Profile:  Recent Labs Lab 08/04/16 0930 08/04/16 1438  INR 1.14 1.23   Cardiac Enzymes: No results for input(s): CKTOTAL, CKMB, CKMBINDEX, TROPONINI in the last 168 hours. BNP (last 3 results) No results for input(s): PROBNP in the last 8760 hours. HbA1C: No results for input(s): HGBA1C in the last 72 hours. CBG: No results for input(s): GLUCAP in the last 168 hours. Lipid Profile: No results for input(s): CHOL, HDL, LDLCALC, TRIG, CHOLHDL, LDLDIRECT in the last 72 hours. Thyroid Function Tests: No results for input(s): TSH, T4TOTAL, FREET4, T3FREE, THYROIDAB in the last 72 hours. Anemia Panel: No results for input(s): VITAMINB12, FOLATE, FERRITIN,  TIBC, IRON, RETICCTPCT in the last 72 hours. Sepsis Labs:  Recent Labs Lab 08/04/16 0947 08/04/16 1438 08/04/16 1748  PROCALCITON  --  15.65  --   LATICACIDVEN 4.04* 2.0* 1.4    Recent Results (from the past 240 hour(s))  Culture, blood (Routine x 2)     Status: None (Preliminary result)   Collection Time: 08/04/16  9:40 AM  Result Value Ref Range Status   Specimen Description BLOOD BLOOD LEFT FOREARM  Final   Special Requests   Final    BOTTLES DRAWN AEROBIC AND ANAEROBIC Blood Culture adequate volume   Culture   Final    NO GROWTH 1 DAY Performed at [facility], [place] [address], [place], [place] [address]  Report Status PENDING  Incomplete  Culture, blood (Routine x 2)     Status: None (Preliminary result)   Collection Time: 08/04/16  9:40 AM  Result Value Ref Range Status   Specimen Description BLOOD BLOOD RIGHT FOREARM  Final   Special Requests   Final    BOTTLES DRAWN AEROBIC AND ANAEROBIC Blood Culture adequate volume   Culture   Final    NO GROWTH 1 DAY Performed at [facility], [place] [address] [place], [place] [address]    Report Status PENDING  Incomplete  Urine culture     Status: Abnormal (Preliminary result)   Collection Time: 08/04/16  9:55 AM  Result Value Ref Range Status   Specimen Description URINE, SUPRAPUBIC  Final   Special Requests NONE  Final   Culture >=100,000 COLONIES/mL ESCHERICHIA COLI (A)  Final   Report Status PENDING  Incomplete     Radiology Studies: Dg Chest 2 View  Result Date: 08/04/2016 CLINICAL DATA:  Fever and chills. EXAM: CHEST  2 VIEW COMPARISON:  05/16/2016 . FINDINGS: Mediastinum and hilar structures normal. Heart size normal. Bilateral pulmonary interstitial prominence noted consistent mild pneumonitis. No pleural effusion or pneumothorax. IMPRESSION: Mild bilateral pulmonary interstitial prominence noted consistent with mild pneumonitis. Electronically Signed   By: Marcello Moores  Register   On: 08/04/2016 10:30   Dg  Chest Port 1 View  Result Date: 08/05/2016 CLINICAL DATA:  Dyspnea EXAM: PORTABLE CHEST 1 VIEW COMPARISON:  PA and lateral chest x-ray dated August 04, 2016 FINDINGS: The lungs are mildly hypoinflated. The interstitial markings are more prominent today. The pulmonary vascularity is engorged. The cardiac silhouette is enlarged. There is calcification in the wall of the aortic arch. There is no pleural effusion. IMPRESSION: Increased interstitial markings and pulmonary vascular prominence consistent with CHF with interstitial edema which has developed since yesterday's study. Certainly worsening of pneumonitis is not absolutely excluded. The appearance is not typical of sarcoidosis. No focal pneumonia. Electronically Signed   By: David  Martinique M.D.   On: 08/05/2016 07:20    Scheduled Meds: . enoxaparin (LOVENOX) injection  40 mg Subcutaneous Q24H  . famotidine  20 mg Oral BID  . mometasone-formoterol  2 puff Inhalation BID  . montelukast  10 mg Oral QHS  . multivitamin with minerals  1 tablet Oral Daily  . sodium chloride flush  3 mL Intravenous Q12H  . triamcinolone  1 spray Nasal QHS  . venlafaxine  75 mg Oral Q breakfast   Continuous Infusions: . piperacillin-tazobactam (ZOSYN)  IV Stopped (08/05/16 1308)  . vancomycin Stopped (08/05/16 1008)     LOS: 1 day   Kolette Vey, Orpah Melter, MD [facility] Pager [phone]  If 7PM-7AM, please contact night-coverage [email or web address] Password [email or web address] 08/05/2016, 5:35 PM

## 2016-08-06 DIAGNOSIS — N390 Urinary tract infection, site not specified: Secondary | ICD-10-CM

## 2016-08-06 DIAGNOSIS — K219 Gastro-esophageal reflux disease without esophagitis: Secondary | ICD-10-CM

## 2016-08-06 DIAGNOSIS — B9620 Unspecified Escherichia coli [E. coli] as the cause of diseases classified elsewhere: Secondary | ICD-10-CM

## 2016-08-06 LAB — BASIC METABOLIC PANEL
ANION GAP: 7 (ref 5–15)
BUN: 8 mg/dL (ref 6–20)
CALCIUM: 8.3 mg/dL — AB (ref 8.9–10.3)
CO2: 25 mmol/L (ref 22–32)
Chloride: 111 mmol/L (ref 101–111)
Creatinine, Ser: 0.88 mg/dL (ref 0.61–1.24)
GFR calc Af Amer: >60 mL/min (ref >60)
GLUCOSE: 109 mg/dL — AB (ref 65–99)
Potassium: 3 mmol/L — ABNORMAL LOW (ref 3.5–5.1)
Sodium: 143 mmol/L (ref 135–145)

## 2016-08-06 LAB — CBC
HCT: 31.7 % — ABNORMAL LOW (ref 39.0–52.0)
HEMOGLOBIN: 10.4 g/dL — AB (ref 13.0–17.0)
MCH: 29 pg (ref 26.0–34.0)
MCHC: 32.8 g/dL (ref 30.0–36.0)
MCV: 88.3 fL (ref 78.0–100.0)
Platelets: 372 10*3/uL (ref 150–400)
RBC: 3.59 MIL/uL — ABNORMAL LOW (ref 4.22–5.81)
RDW: 13 % (ref 11.5–15.5)
WBC: 9.6 10*3/uL (ref 4.0–10.5)

## 2016-08-06 LAB — MAGNESIUM: MAGNESIUM: 1.8 mg/dL (ref 1.7–2.4)

## 2016-08-06 LAB — URINE CULTURE: Culture: >=100000 — AB

## 2016-08-06 MED ORDER — AMOXICILLIN 250 MG PO CAPS: 500.0000 mg | ORAL_CAPSULE | Freq: Three times a day (TID) | ORAL | Status: DC

## 2016-08-06 MED ORDER — DEXTROSE 5 % IV SOLN
2.0000 g | INTRAVENOUS | Status: DC
  Administered 2016-08-06: 2 g via INTRAVENOUS
  Filled 2016-08-06: qty 2

## 2016-08-06 MED ORDER — SACCHAROMYCES BOULARDII 250 MG PO CAPS: 250.0000 mg | ORAL_CAPSULE | Freq: Two times a day (BID) | ORAL | 0 refills | Status: DC

## 2016-08-06 MED ORDER — PROPOFOL 10 MG/ML IV BOLUS
INTRAVENOUS | Status: AC
  Filled 2016-08-06: qty 60

## 2016-08-06 MED ORDER — POTASSIUM CHLORIDE CRYS ER 20 MEQ PO TBCR
40.0000 meq | EXTENDED_RELEASE_TABLET | Freq: Two times a day (BID) | ORAL | Status: DC
  Administered 2016-08-06: 40 meq via ORAL
  Filled 2016-08-06: qty 2

## 2016-08-06 MED ORDER — SACCHAROMYCES BOULARDII 250 MG PO CAPS: 250.0000 mg | ORAL_CAPSULE | Freq: Two times a day (BID) | ORAL | Status: DC

## 2016-08-06 MED ORDER — AMOXICILLIN 500 MG PO CAPS: 500.0000 mg | ORAL_CAPSULE | Freq: Three times a day (TID) | ORAL | 0 refills | Status: AC

## 2016-08-06 MED ORDER — DEXAMETHASONE SODIUM PHOSPHATE 10 MG/ML IJ SOLN
INTRAMUSCULAR | Status: AC
  Filled 2016-08-06: qty 1

## 2016-08-06 MED ORDER — MIDAZOLAM HCL 2 MG/2ML IJ SOLN
INTRAMUSCULAR | Status: AC
  Filled 2016-08-06: qty 2

## 2016-08-06 MED ORDER — BUPIVACAINE HCL (PF) 0.5 % IJ SOLN
INTRAMUSCULAR | Status: AC
  Filled 2016-08-06: qty 30

## 2016-08-06 MED ORDER — LIDOCAINE 2% (20 MG/ML) 5 ML SYRINGE
INTRAMUSCULAR | Status: AC
  Filled 2016-08-06: qty 5

## 2016-08-06 MED ORDER — ONDANSETRON HCL 4 MG/2ML IJ SOLN
INTRAMUSCULAR | Status: AC
  Filled 2016-08-06: qty 2

## 2016-08-06 MED ORDER — EPHEDRINE 5 MG/ML INJ
INTRAVENOUS | Status: AC
  Filled 2016-08-06: qty 10

## 2016-08-06 MED ORDER — FENTANYL CITRATE (PF) 100 MCG/2ML IJ SOLN
INTRAMUSCULAR | Status: AC
  Filled 2016-08-06: qty 2

## 2016-08-06 MED ORDER — PHENYLEPHRINE 40 MCG/ML (10ML) SYRINGE FOR IV PUSH (FOR BLOOD PRESSURE SUPPORT)
PREFILLED_SYRINGE | INTRAVENOUS | Status: AC
  Filled 2016-08-06: qty 10

## 2016-08-06 MED ORDER — PROPOFOL 10 MG/ML IV BOLUS
INTRAVENOUS | Status: AC
  Filled 2016-08-06: qty 20

## 2016-08-06 NOTE — Progress Notes (Signed)
Pharmacy Antibiotic Note  Adrian Rios is a 76 y.o. male admitted on 08/04/2016 with sepsis, suspected UTI. Hx includes recently diagnosed bladder cancer s/p radical cystoprostatectomy in April, urostomy, second ureteral stent removed last week and took 3 days of prophylactic antibiotics which he completed on Friday. Pharmacy was intially consulted for vancomycin and zosyn dosing, now narrowed to ceftriaxone.  Plan:  Ceftriaxone 2g IV q24h   Follow renal function, cultures, clinical course   Height: 5\' 7"  (170.2 cm) Weight: 160 lb 15 oz (73 kg) IBW/kg (Calculated) : 66.1  Temp (24hrs), Avg:98.9 F (37.2 C), Min:98.7 F (37.1 C), Max:99.6 F (37.6 C)   Recent Labs Lab 08/04/16 0930 08/04/16 0947 08/04/16 1438 08/04/16 1748 08/05/16 0537 08/06/16 0537  WBC 12.6*  --  16.5*  --  10.2 9.6  CREATININE 1.01  --   --   --  0.87 0.88  LATICACIDVEN  --  4.04* 2.0* 1.4  --   --     Estimated Creatinine Clearance: 66.8 mL/min (by C-G formula based on SCr of 0.88 mg/dL).    Allergies  Allergen Reactions  . Ivp Dye [Iodinated Diagnostic Agents] Shortness Of Breath, Itching and Palpitations    "eyes itching,  Heart racing,  Effected breathing"  . Shellfish Allergy Hives, Shortness Of Breath and Itching    Mostly crab  . Gabapentin Other (See Comments)    REACTION: dizziness and flushing  . Niacin-Lovastatin Er Other (See Comments)    Did not feel good on medication  . Adhesive [Tape] Rash    Use paper tape    Antimicrobials this admission: 6/5 vanc >> 6/7 6/5 zosyn >> 6/7 6/7 Ceftriaxone >>  Microbiology results: 6/5 BCx: ngtd 6/5 UCx: > 100k Ecoli  Thank you for allowing pharmacy to be a part of this patient's care.  Gretta Arab PharmD, BCPS Pager [phone] 08/06/2016 8:22 AM

## 2016-08-06 NOTE — Discharge Summary (Addendum)
Physician Discharge Summary  Adrian Rios SPQ:[id number] DOB: 11/02/1940 DOA: 08/04/2016  PCP: [name], MD  Admit date: 08/04/2016 Discharge date: 08/06/2016  Admitted From: Home Disposition:  Home  Recommendations for Outpatient Follow-up:  1. Follow up with PCP in 1-2 weeks  Discharge Condition:Improved CODE STATUS:Full Diet recommendation: Regular   Brief/Interim Summary: 76 y.o.malewith history of COPD, pulmonary sarcoidosis,GERD, prostate cancer, bladder cancer diagnosed in February of the year s/p radical cystoprostatectomy in April and without lymph node involvment or need for adjuvant chemotherapy who presents with fever and rigors. He had his second ureteral stent removed last week and took 3 days of prophylactic antibiotics which he completed on Friday. On Saturday, he awoke with severe left flank pain which improved with tramadol. He has not had similar pain since. He was feeling well yesterday when he went to bed but then woke up this morning at 6 AM with high fevers, chills, and full body shakes suggestive of Reiter's. He felt very short of breath this morning but was not coughing much. His wife noticed that the urine in his urostomy was more cloudy than usual this morning. He denies abdominal pains. He had some diarrhea last week which she always gets when he takes antibiotics but this is since resolved. He denies upper respiratory symptoms, chest pain or tightness or wheezing.  Sepsis secondary to ecoli UTI present on admit(fever, tachycardia, tachypnea, elevated lactic acid) likely secondary toUTI -  Urine cx pos for multidrug sensitive ecoli - Initially continued Vancomycin and zosyn - Given one dose of rocephin. Will complete course with amoxicillin  Acute respiratory failure with hypoxiawas likely transient and due to sepsis/high fever - resolved  COPD/sarcoidosis, currently breathing comfortably - Continue albuterol, advair equivalent, andsingulair -  Stable at present  Bladder cancer s/p resection, hx of pr CA - Ostomy care  - Dr. Tresa Moore was made aware of patient's admission. - remains stable  GERD, stable - Continue H2 blocker -Remains stable  Depression/anxiety - Continue venlafaxine - currently stable  Discharge Diagnoses:  Principal Problem:   Sepsis ([place]) Active Problems:   Sarcoidosis ([place])   Depression   RAD (reactive airway disease)   Esophageal reflux   Bladder cancer ([place])   Malnutrition of moderate degree    Discharge Instructions   Allergies as of 08/06/2016      Reactions   Ivp Dye [iodinated Diagnostic Agents] Shortness Of Breath, Itching, Palpitations   "eyes itching,  Heart racing,  Effected breathing"   Shellfish Allergy Hives, Shortness Of Breath, Itching   Mostly crab   Gabapentin Other (See Comments)   REACTION: dizziness and flushing   Niacin-lovastatin Er Other (See Comments)   Did not feel good on medication   Adhesive [tape] Rash   Use paper tape      Medication List    TAKE these medications   albuterol 108 (90 Base) MCG/ACT inhaler Commonly known as:  PROVENTIL HFA;VENTOLIN HFA Inhale 2 puffs into the lungs every 6 (six) hours as needed for wheezing or shortness of breath.   amoxicillin 500 MG capsule Commonly known as:  AMOXIL Take 1 capsule (500 mg total) by mouth every 8 (eight) hours. Start taking on:  08/07/2016   Fluticasone-Salmeterol 250-50 MCG/DOSE Aepb Commonly known as:  ADVAIR DISKUS INHALE ONE DOSE BY MOUTH EVERY 12 HOURS   GLUCOSAMINE CHOND DOUBLE STR PO Take 1 tablet by mouth 2 (two) times daily.   magnesium oxide 400 MG tablet Commonly known as:  MAG-OX Take 400 mg  by mouth daily.   montelukast 10 MG tablet Commonly known as:  SINGULAIR Take 1 tablet (10 mg total) by mouth daily. What changed:  when to take this   NASACORT ALLERGY 24HR 55 MCG/ACT Aero nasal inhaler Generic drug:  triamcinolone Place 1 spray into the nose at bedtime.    ranitidine 150 MG tablet Commonly known as:  ZANTAC TAKE ONE TABLET BY MOUTH AT BEDTIME   saccharomyces boulardii 250 MG capsule Commonly known as:  FLORASTOR Take 1 capsule (250 mg total) by mouth 2 (two) times daily.   venlafaxine 75 MG tablet Commonly known as:  EFFEXOR TAKE ONE TABLET BY MOUTH ONCE DAILY      Follow-up Information    [name], MD. Schedule an appointment as soon as possible for a visit in 2 week(s).   Specialty:  Internal Medicine Contact information: [place] [address] [phone]          Allergies  Allergen Reactions  . Ivp Dye [Iodinated Diagnostic Agents] Shortness Of Breath, Itching and Palpitations    "eyes itching,  Heart racing,  Effected breathing"  . Shellfish Allergy Hives, Shortness Of Breath and Itching    Mostly crab  . Gabapentin Other (See Comments)    REACTION: dizziness and flushing  . Niacin-Lovastatin Er Other (See Comments)    Did not feel good on medication  . Adhesive [Tape] Rash    Use paper tape    Procedures/Studies: Dg Chest 2 View  Result Date: 08/04/2016 CLINICAL DATA:  Fever and chills. EXAM: CHEST  2 VIEW COMPARISON:  05/16/2016 . FINDINGS: Mediastinum and hilar structures normal. Heart size normal. Bilateral pulmonary interstitial prominence noted consistent mild pneumonitis. No pleural effusion or pneumothorax. IMPRESSION: Mild bilateral pulmonary interstitial prominence noted consistent with mild pneumonitis. Electronically Signed   By: Marcello Moores  Register   On: 08/04/2016 10:30   Dg Chest Port 1 View  Result Date: 08/05/2016 CLINICAL DATA:  Dyspnea EXAM: PORTABLE CHEST 1 VIEW COMPARISON:  PA and lateral chest x-ray dated August 04, 2016 FINDINGS: The lungs are mildly hypoinflated. The interstitial markings are more prominent today. The pulmonary vascularity is engorged. The cardiac silhouette is enlarged. There is calcification in the wall of the aortic arch. There is no pleural effusion.  IMPRESSION: Increased interstitial markings and pulmonary vascular prominence consistent with CHF with interstitial edema which has developed since yesterday's study. Certainly worsening of pneumonitis is not absolutely excluded. The appearance is not typical of sarcoidosis. No focal pneumonia. Electronically Signed   By: David  Martinique M.D.   On: 08/05/2016 07:20    Subjective: Eager to go home  Discharge Exam: Vitals:   08/05/16 2226 08/06/16 0522  BP:  127/63  Pulse:  62  Resp:  20  Temp: 98.7 F (37.1 C) 98.7 F (37.1 C)   Vitals:   08/05/16 2121 08/05/16 2125 08/05/16 2226 08/06/16 0522  BP:  111/61  127/63  Pulse:  71  62  Resp:  20  20  Temp:  99.6 F (37.6 C) 98.7 F (37.1 C) 98.7 F (37.1 C)  TempSrc:  Oral Oral Oral  SpO2: 93% 94%  94%  Weight:      Height:        General: Pt is alert, awake, not in acute distress Cardiovascular: RRR, S1/S2 +, no rubs, no gallops Respiratory: CTA bilaterally, no wheezing, no rhonchi Abdominal: Soft, NT, ND, bowel sounds + Extremities: no edema, no cyanosis   The results of significant diagnostics from  this hospitalization (including imaging, microbiology, ancillary and laboratory) are listed below for reference.     Microbiology: Recent Results (from the past 240 hour(s))  Culture, blood (Routine x 2)     Status: None (Preliminary result)   Collection Time: 08/04/16  9:40 AM  Result Value Ref Range Status   Specimen Description BLOOD BLOOD LEFT FOREARM  Final   Special Requests   Final    BOTTLES DRAWN AEROBIC AND ANAEROBIC Blood Culture adequate volume   Culture   Final    NO GROWTH 2 DAYS Performed at [facility], [address] [place], [place] [address]    Report Status PENDING  Incomplete  Culture, blood (Routine x 2)     Status: None (Preliminary result)   Collection Time: 08/04/16  9:40 AM  Result Value Ref Range Status   Specimen Description BLOOD BLOOD RIGHT FOREARM  Final   Special Requests   Final     BOTTLES DRAWN AEROBIC AND ANAEROBIC Blood Culture adequate volume   Culture   Final    NO GROWTH 2 DAYS Performed at [facility], [place] [address], [place], [place] [address]    Report Status PENDING  Incomplete  Urine culture     Status: Abnormal   Collection Time: 08/04/16  9:55 AM  Result Value Ref Range Status   Specimen Description URINE, SUPRAPUBIC  Final   Special Requests NONE  Final   Culture >=100,000 COLONIES/mL ESCHERICHIA COLI (A)  Final   Report Status 08/06/2016 FINAL  Final   Organism ID, Bacteria ESCHERICHIA COLI (A)  Final      Susceptibility   Escherichia coli - MIC*    AMPICILLIN 4 SENSITIVE Sensitive     CEFAZOLIN <=4 SENSITIVE Sensitive     CEFTRIAXONE <=1 SENSITIVE Sensitive     CIPROFLOXACIN <=0.25 SENSITIVE Sensitive     GENTAMICIN <=1 SENSITIVE Sensitive     IMIPENEM <=0.25 SENSITIVE Sensitive     NITROFURANTOIN <=16 SENSITIVE Sensitive     TRIMETH/SULFA <=20 SENSITIVE Sensitive     AMPICILLIN/SULBACTAM <=2 SENSITIVE Sensitive     PIP/TAZO <=4 SENSITIVE Sensitive     Extended ESBL NEGATIVE Sensitive     * >=100,000 COLONIES/mL ESCHERICHIA COLI     Labs: BNP (last 3 results) No results for input(s): BNP in the last 8760 hours. Basic Metabolic Panel:  Recent Labs Lab 08/04/16 0930 08/05/16 0537 08/06/16 0537  NA 140 143 143  K 3.8 3.4* 3.0*  CL 109 114* 111  CO2 17* 25 25  GLUCOSE 118* 106* 109*  BUN 17 11 8   CREATININE 1.01 0.87 0.88  CALCIUM 8.8* 8.1* 8.3*  MG  --   --  1.8   Liver Function Tests:  Recent Labs Lab 08/04/16 0930  AST 28  ALT 17  ALKPHOS 138*  BILITOT 1.0  PROT 7.3  ALBUMIN 3.3*   No results for input(s): LIPASE, AMYLASE in the last 168 hours. No results for input(s): AMMONIA in the last 168 hours. CBC:  Recent Labs Lab 08/04/16 0930 08/04/16 1438 08/05/16 0537 08/06/16 0537  WBC 12.6* 16.5* 10.2 9.6  NEUTROABS 11.1* 15.1*  --   --   HGB 12.3* 10.0* 9.8* 10.4*  HCT 36.2* 30.3* 29.8* 31.7*   MCV 87.2 88.6 89.2 88.3  PLT 390 341 326 372   Cardiac Enzymes: No results for input(s): CKTOTAL, CKMB, CKMBINDEX, TROPONINI in the last 168 hours. BNP: Invalid input(s): POCBNP CBG: No results for input(s): GLUCAP in the last 168  hours. D-Dimer No results for input(s): DDIMER in the last 72 hours. Hgb A1c No results for input(s): HGBA1C in the last 72 hours. Lipid Profile No results for input(s): CHOL, HDL, LDLCALC, TRIG, CHOLHDL, LDLDIRECT in the last 72 hours. Thyroid function studies No results for input(s): TSH, T4TOTAL, T3FREE, THYROIDAB in the last 72 hours.  Invalid input(s): FREET3 Anemia work up No results for input(s): VITAMINB12, FOLATE, FERRITIN, TIBC, IRON, RETICCTPCT in the last 72 hours. Urinalysis    Component Value Date/Time   COLORURINE YELLOW 08/04/2016 0955   APPEARANCEUR TURBID (A) 08/04/2016 0955   LABSPEC 1.015 08/04/2016 0955   PHURINE 6.0 08/04/2016 0955   GLUCOSEU NEGATIVE 08/04/2016 0955   HGBUR LARGE (A) 08/04/2016 0955   HGBUR negative 07/11/2009 0813   BILIRUBINUR NEGATIVE 08/04/2016 0955   KETONESUR NEGATIVE 08/04/2016 0955   PROTEINUR 30 (A) 08/04/2016 0955   UROBILINOGEN 0.2 07/11/2009 0813   NITRITE POSITIVE (A) 08/04/2016 0955   LEUKOCYTESUR LARGE (A) 08/04/2016 0955   Sepsis Labs Invalid input(s): PROCALCITONIN,  WBC,  LACTICIDVEN Microbiology Recent Results (from the past 240 hour(s))  Culture, blood (Routine x 2)     Status: None (Preliminary result)   Collection Time: 08/04/16  9:40 AM  Result Value Ref Range Status   Specimen Description BLOOD BLOOD LEFT FOREARM  Final   Special Requests   Final    BOTTLES DRAWN AEROBIC AND ANAEROBIC Blood Culture adequate volume   Culture   Final    NO GROWTH 2 DAYS Performed at [facility], [place] [address] [place], [place] [address]    Report Status PENDING  Incomplete  Culture, blood (Routine x 2)     Status: None (Preliminary result)   Collection Time: 08/04/16  9:40 AM   Result Value Ref Range Status   Specimen Description BLOOD BLOOD RIGHT FOREARM  Final   Special Requests   Final    BOTTLES DRAWN AEROBIC AND ANAEROBIC Blood Culture adequate volume   Culture   Final    NO GROWTH 2 DAYS Performed at [facility], [place] [address] [place],  [address]    Report Status PENDING  Incomplete  Urine culture     Status: Abnormal   Collection Time: 08/04/16  9:55 AM  Result Value Ref Range Status   Specimen Description URINE, SUPRAPUBIC  Final   Special Requests NONE  Final   Culture >=100,000 COLONIES/mL ESCHERICHIA COLI (A)  Final   Report Status 08/06/2016 FINAL  Final   Organism ID, Bacteria ESCHERICHIA COLI (A)  Final      Susceptibility   Escherichia coli - MIC*    AMPICILLIN 4 SENSITIVE Sensitive     CEFAZOLIN <=4 SENSITIVE Sensitive     CEFTRIAXONE <=1 SENSITIVE Sensitive     CIPROFLOXACIN <=0.25 SENSITIVE Sensitive     GENTAMICIN <=1 SENSITIVE Sensitive     IMIPENEM <=0.25 SENSITIVE Sensitive     NITROFURANTOIN <=16 SENSITIVE Sensitive     TRIMETH/SULFA <=20 SENSITIVE Sensitive     AMPICILLIN/SULBACTAM <=2 SENSITIVE Sensitive     PIP/TAZO <=4 SENSITIVE Sensitive     Extended ESBL NEGATIVE Sensitive     * >=100,000 COLONIES/mL ESCHERICHIA COLI     SIGNED:   Donne Hazel, MD  [facility] 08/06/2016, 5:53 PM  If 7PM-7AM, please contact night-coverage [email or web address] Password [email or web address]

## 2016-08-06 NOTE — Care Management Note (Signed)
Case Management Note  Patient Details  Name: DYRELL TUCCILLO MRN: [id number] Date of Birth: 1940-04-16  Subjective/Objective:                    Action/Plan:d/c home.   Expected Discharge Date:  08/06/16               Expected Discharge Plan:  Home/Self Care  In-House Referral:     Discharge planning Services  CM Consult  Post Acute Care Choice:    Choice offered to:     DME Arranged:    DME Agency:     HH Arranged:    HH Agency:     Status of Service:  Completed, signed off  If discussed at [employer] of Stay Meetings, dates discussed:    Additional Comments:  Dessa Phi, RN 08/06/2016, 12:26 PM

## 2016-08-09 LAB — CULTURE, BLOOD (ROUTINE X 2)
CULTURE: NO GROWTH
Culture: NO GROWTH
SPECIAL REQUESTS: ADEQUATE
SPECIAL REQUESTS: ADEQUATE

## 2016-08-12 DIAGNOSIS — C61 Malignant neoplasm of prostate: Secondary | ICD-10-CM | POA: Diagnosis not present

## 2016-08-17 ENCOUNTER — Telehealth: Payer: Self-pay | Admitting: Internal Medicine

## 2016-08-17 DIAGNOSIS — R300 Dysuria: Secondary | ICD-10-CM

## 2016-08-17 NOTE — Telephone Encounter (Signed)
Pt called stating that he has bladder removal surgery in April. 2 1/2 weeks ago he was treated in the hospital for sepsis. He took his pulse at 4:00pm today and it was 108. He wanted to make sure that this was not something to be concerned about. He does have an appointment with you on Wednesday. Please advise. (Pt can be reached at [phone])

## 2016-08-17 NOTE — Telephone Encounter (Signed)
He should monitor his pulse and temperature.  If pulse continues to be elevated it could be a concern for sepsis.  If he has concerning symptoms of an infection he needs to be seen sooner than Wednesday somewhere.

## 2016-08-17 NOTE — Telephone Encounter (Signed)
Spoke with pt to inform.  

## 2016-08-18 NOTE — Progress Notes (Signed)
Subjective:    Patient ID: Adrian Rios, male    DOB: 1940/05/11, 76 y.o.   MRN: [id number]  HPI The patient is here for follow up.  Prediabetes:  He is compliant with a low sugar/carbohydrate diet.  He is exercising regularly-  Walking as much as possible.  GERD:  He is taking his medication daily as prescribed.  He denies any GERD symptoms and feels his GERD is well controlled.   Reactive airway disease:  He uses advair once daily.  He takes nasacort daily.  Occasionally he uses the albuterol.  He denies cough, wheeze and SO.    Depression: He is taking his medication daily as prescribed. He denies any side effects from the medication. He feels his depression is well controlled and he is happy with his current dose of medication.   Hospitalization 08/04/16 - 08/06/16 for fevers, chills and full body shakes.  She was SOB.  His urine was more cloudy.  He denied cough, abdominal pain, chest pain, tightness, wheeze.  He was admitted for sepsis secondary to [facility] UTI.  He was treated with vancomycin/zosyn.  He ws discharged home on amoxicillin.  He had acute resp failure with hypoxia, which was related to sepsis/high fever.  This improved with treatment.   His other chornic medical problems were stable.   The last 2-3 days his pulse has been between 95-100.  His BP has been normal.  He denies fever/chills.  His urine looks normal.   He is not on BP medications.  He is drinking of plenty of fluids.    Ever since his bladder cancer surgery he has had less of an appetite.  His taste is off.  He fills up quickly.  He feels more fatigued.    Medications and allergies reviewed with patient and updated if appropriate.  Patient Active Problem List   Diagnosis Date Noted  . Malnutrition of moderate degree 08/05/2016  . Sepsis ([place]) 08/04/2016  . Skin erythema 06/23/2016  . History of bladder cancer 06/18/2016  . History of prostate cancer 06/18/2016  . 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 Bladder  cancer ([place]) 06/11/2016  . Fever 05/16/2016  . Carotid bruit 02/17/2016  . Prediabetes 08/20/2015  . Overweight (BMI 25.0-29.9) 08/20/2015  . Esophageal reflux 08/20/2015  . Glaucoma 02/14/2015  . Varicose veins of lower extremities with other complications 38/11/1749  . RAD (reactive airway disease) 10/27/2011  . 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 Anemia, unspecified 10/27/2011  . 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 ERECTILE DYSFUNCTION 04/23/2010  . [place] DISEASE, LUMBOSACRAL SPINE 02/06/2010  . 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 Depression 04/12/2008  . Allergic rhinitis 04/12/2008  . ELEVATED PROSTATE SPECIFIC ANTIGEN 04/12/2008  . Hyperlipidemia 10/05/2006  . Sarcoidosis ([place]) 07/05/2006    Current Outpatient Prescriptions on File Prior to Visit  Medication Sig Dispense Refill  . albuterol (PROVENTIL HFA;VENTOLIN HFA) 108 (90 Base) MCG/ACT inhaler Inhale 2 puffs into the lungs every 6 (six) hours as needed for wheezing or shortness of breath. 1 Inhaler 8  . Fluticasone-Salmeterol (ADVAIR DISKUS) 250-50 MCG/DOSE AEPB INHALE ONE DOSE BY MOUTH EVERY 12 HOURS 60 each 1  . magnesium oxide (MAG-OX) 400 MG tablet Take 400 mg by mouth daily.    . Misc Natural Products (GLUCOSAMINE CHOND DOUBLE STR PO) Take 1 tablet by mouth 2 (two) times daily.    . montelukast (SINGULAIR) 10 MG tablet Take 1 tablet (10 mg total) by mouth daily. (Patient taking differently:  Take 10 mg by mouth at bedtime. ) 90 tablet 0  . ranitidine (ZANTAC) 150 MG tablet TAKE ONE TABLET BY MOUTH AT BEDTIME 90 tablet 1  . saccharomyces boulardii (FLORASTOR) 250 MG capsule Take 1 capsule (250 mg total) by mouth 2 (two) times daily. 60 capsule 0  . triamcinolone (NASACORT ALLERGY 24HR) 55 MCG/ACT AERO nasal inhaler Place 1 spray into the nose at bedtime.    Marland Kitchen venlafaxine (EFFEXOR) 75 MG tablet TAKE ONE TABLET BY MOUTH ONCE DAILY 90 tablet 0   No current facility-administered medications on  file prior to visit.     Past Medical History:  Diagnosis Date  . Allergic rhinitis   . Arthritis   . Benign localized prostatic hyperplasia with lower urinary tract symptoms (LUTS)   . Bladder tumor   . Borderline glaucoma of right eye   . Cancer [facility])    bladder and prostate  . Carotid bruit    per duplex 03-11-2016 RICA 1-39%  . Chronic throat clearing   . DDD (degenerative disc disease), lumbar   . Depression   . ED (erectile dysfunction)   . Elevated PSA    urologist-  dr [name]--- s/p  prostate bx's  . GERD (gastroesophageal reflux disease)   . Hematuria   . History of chronic bronchitis   . 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 History of squamous cell carcinoma excision   . History of squamous cell carcinoma excision    several times  . Hyperlipidemia   . Pre-diabetes   . Premature ventricular contractions (PVCs) (VPCs)   . RAD (reactive airway disease)   . 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 Sensorineural hearing loss (SNHL) of both ears   . Wears glasses   . Wears hearing aid    bilateral    Past Surgical History:  Procedure Laterality Date  . APPENDECTOMY  2008  . CARDIOVASCULAR STRESS TEST  05/27/2010   normal nuclear study w/ no ischemia/  normal LV function and wall motion , ef 60%  . CYSTOSCOPY WITH INJECTION N/A 06/12/2016   Procedure: CYSTOSCOPY WITH INJECTION OF INDOCYANINE GREEN DYE;  Surgeon: Alexis Frock, MD;  Location: [facility];  Service: Urology;  Laterality: N/A;  . DEEP NECK LYMPH NODE BIOPSY / EXCISION  1970's   and Bronchoscopy w/ bx's ( dx Sarcoidosis)  . ILEOSTOMY    . MOHS SURGERY  2013 approx.    nasal; pre melanoma  . PARS PLANA VITRECTOMY Right 2007   repair macular pucker  . TONSILLECTOMY AND ADENOIDECTOMY  child  . TRANSURETHRAL RESECTION OF BLADDER TUMOR N/A 04/06/2016   Procedure: CYSTOSCOPY TRANSURETHRAL  RESECTION OF BLADDER TUMORS (TURBT);  Surgeon: Carolan Clines, MD;  Location: [facility];  Service: Urology;  Laterality: N/A;  . TRANSURETHRAL RESECTION OF PROSTATE      Social History   Social History  . Marital status: Married    Spouse name: N/A  . Number of children: N/A  . Years of education: N/A   Social History Main Topics  . Smoking status: Never Smoker  . Smokeless tobacco: Never Used  . Alcohol use No  . Drug use: No  . Sexual activity: No   Other Topics Concern  . None   Social History Narrative  . None    Family History  Problem Relation  Age of Onset  . Stroke Mother        in her 71s  . Breast cancer Sister   . Heart disease Maternal Grandmother 84       MI   . Breast cancer Paternal Grandmother   . Heart disease Paternal Grandfather 34       MI  . Stroke Brother   . Diabetes Neg Hx     Review of Systems  Constitutional: Positive for appetite change (decreased since surgery). Negative for chills and fever.  Respiratory: Negative for cough, shortness of breath and wheezing.   Cardiovascular: Negative for chest pain, palpitations and leg swelling.  Gastrointestinal: Positive for abdominal pain (mild lower abdominal pain from surgery - intermittent) and diarrhea. Negative for blood in stool, constipation and nausea.  Genitourinary:       No cloudy urine  Neurological: Positive for light-headedness (occasional) and headaches (intermittent since surgery).       Objective:   Vitals:   08/19/16 0932  BP: 102/70  Pulse: 93  Resp: 16  Temp: 98.5 F (36.9 C)   Wt Readings from Last 3 Encounters:  08/19/16 159 lb (72.1 kg)  08/04/16 160 lb 15 oz (73 kg)  07/23/16 162 lb 3.2 oz (73.6 kg)   Body mass index is 24.9 kg/m.   Physical Exam    Constitutional: Appears well-developed and well-nourished. No distress.  HENT:  Head: Normocephalic and atraumatic.  Neck: Neck supple. No tracheal deviation present. No thyromegaly  present.  No cervical lymphadenopathy Cardiovascular: Normal rate, regular rhythm and normal heart sounds.   No murmur heard. No carotid bruit .  No edema Pulmonary/Chest: Effort normal and breath sounds normal. No respiratory distress. No has no wheezes. No rales.  Abdomen: soft, non tender, non distended Skin: Skin is warm and dry. Not diaphoretic.  Psychiatric: Normal mood and affect. Behavior is normal.      Assessment & Plan:    See Problem List for Assessment and Plan of chronic medical problems.

## 2016-08-19 ENCOUNTER — Ambulatory Visit (INDEPENDENT_AMBULATORY_CARE_PROVIDER_SITE_OTHER): Payer: Medicare Other | Admitting: Internal Medicine

## 2016-08-19 ENCOUNTER — Encounter: Payer: Self-pay | Admitting: Internal Medicine

## 2016-08-19 ENCOUNTER — Other Ambulatory Visit (INDEPENDENT_AMBULATORY_CARE_PROVIDER_SITE_OTHER): Payer: Medicare Other

## 2016-08-19 VITALS — BP 102/70 | HR 93 | Temp 98.5°F | Resp 16 | Wt 159.0 lb

## 2016-08-19 DIAGNOSIS — F32A Depression, unspecified: Secondary | ICD-10-CM

## 2016-08-19 DIAGNOSIS — F329 Major depressive disorder, single episode, unspecified: Secondary | ICD-10-CM

## 2016-08-19 DIAGNOSIS — K219 Gastro-esophageal reflux disease without esophagitis: Secondary | ICD-10-CM | POA: Diagnosis not present

## 2016-08-19 DIAGNOSIS — R7303 Prediabetes: Secondary | ICD-10-CM

## 2016-08-19 DIAGNOSIS — J4520 Mild intermittent asthma, uncomplicated: Secondary | ICD-10-CM | POA: Diagnosis not present

## 2016-08-19 DIAGNOSIS — R5383 Other fatigue: Secondary | ICD-10-CM | POA: Diagnosis not present

## 2016-08-19 LAB — COMPREHENSIVE METABOLIC PANEL
ALBUMIN: 3.7 g/dL (ref 3.5–5.2)
ALT: 12 U/L (ref 0–53)
AST: 15 U/L (ref 0–37)
Alkaline Phosphatase: 109 U/L (ref 39–117)
BUN: 22 mg/dL (ref 6–23)
CALCIUM: 9.5 mg/dL (ref 8.4–10.5)
CHLORIDE: 108 meq/L (ref 96–112)
CO2: 23 meq/L (ref 19–32)
CREATININE: 0.9 mg/dL (ref 0.40–1.50)
GFR: 87.17 mL/min (ref >60.00)
Glucose, Bld: 111 mg/dL — ABNORMAL HIGH (ref 70–99)
Potassium: 4.3 mEq/L (ref 3.5–5.1)
Sodium: 140 mEq/L (ref 135–145)
Total Bilirubin: 0.4 mg/dL (ref 0.2–1.2)
Total Protein: 7.4 g/dL (ref 6.0–8.3)

## 2016-08-19 LAB — CBC WITH DIFFERENTIAL/PLATELET
BASOS PCT: 0.8 % (ref 0.0–3.0)
Basophils Absolute: 0.1 10*3/uL (ref 0.0–0.1)
EOS ABS: 0.2 10*3/uL (ref 0.0–0.7)
EOS PCT: 1.6 % (ref 0.0–5.0)
HEMATOCRIT: 36.1 % — AB (ref 39.0–52.0)
HEMOGLOBIN: 12 g/dL — AB (ref 13.0–17.0)
LYMPHS PCT: 21.5 % (ref 12.0–46.0)
Lymphs Abs: 2.4 10*3/uL (ref 0.7–4.0)
MCHC: 33.1 g/dL (ref 30.0–36.0)
MCV: 87 fl (ref 78.0–100.0)
Monocytes Absolute: 0.8 10*3/uL (ref 0.1–1.0)
Monocytes Relative: 7.2 % (ref 3.0–12.0)
NEUTROS ABS: 7.6 10*3/uL (ref 1.4–7.7)
Neutrophils Relative %: 68.9 % (ref 43.0–77.0)
PLATELETS: 584 10*3/uL — AB (ref 150.0–400.0)
RBC: 4.15 Mil/uL — ABNORMAL LOW (ref 4.22–5.81)
RDW: 13.1 % (ref 11.5–15.5)
WBC: 11 10*3/uL — AB (ref 4.0–10.5)

## 2016-08-19 LAB — TSH: TSH: 1.58 u[IU]/mL (ref 0.35–4.50)

## 2016-08-19 LAB — HEMOGLOBIN A1C: HEMOGLOBIN A1C: 6 % (ref 4.6–6.5)

## 2016-08-19 NOTE — Assessment & Plan Note (Signed)
Controlled, stable Continue current dose of medication  

## 2016-08-19 NOTE — Patient Instructions (Addendum)
  Test(s) ordered today. Your results will be released to MyChart (or called to you) after review, usually within 72hours after test completion. If any changes need to be made, you will be notified at that same time.  Medications reviewed and updated.  No changes recommended at this time.    Please followup in 6 months   

## 2016-08-19 NOTE — Assessment & Plan Note (Signed)
Check a1c Low sugar / carb diet Stressed regular exercise   

## 2016-08-19 NOTE — Assessment & Plan Note (Signed)
Taking advair daily, albuterol only as needed Using nasacort daily Overall, controlled Continue above

## 2016-08-19 NOTE — Assessment & Plan Note (Signed)
Since surgery Will check cbc, tsh

## 2016-08-19 NOTE — Assessment & Plan Note (Signed)
GERD controlled Continue daily medication  

## 2016-08-21 ENCOUNTER — Other Ambulatory Visit: Payer: Self-pay | Admitting: Internal Medicine

## 2016-08-21 ENCOUNTER — Ambulatory Visit: Payer: Medicare Other | Admitting: Internal Medicine

## 2016-08-21 ENCOUNTER — Other Ambulatory Visit (INDEPENDENT_AMBULATORY_CARE_PROVIDER_SITE_OTHER): Payer: Medicare Other

## 2016-08-21 DIAGNOSIS — R300 Dysuria: Secondary | ICD-10-CM | POA: Diagnosis not present

## 2016-08-21 LAB — URINALYSIS, ROUTINE W REFLEX MICROSCOPIC
Bilirubin Urine: NEGATIVE
Hgb urine dipstick: NEGATIVE
Ketones, ur: NEGATIVE
Nitrite: NEGATIVE
TOTAL PROTEIN, URINE-UPE24: NEGATIVE
URINE GLUCOSE: NEGATIVE
UROBILINOGEN UA: 0.2 (ref 0.0–1.0)
pH: 6.5 (ref 5.0–8.0)

## 2016-08-21 MED ORDER — CEPHALEXIN 500 MG PO CAPS: 500.0000 mg | ORAL_CAPSULE | Freq: Three times a day (TID) | ORAL | 0 refills | Status: DC

## 2016-08-21 NOTE — Telephone Encounter (Signed)
Spoke with pts wife, at 40 HR was 91, temp 98.7, after taking tylenol at 530 am. Per Dr Quay Burow urine orders put in for pt to come before appt this afternoon.

## 2016-08-21 NOTE — Telephone Encounter (Signed)
Wife called stating the Pt is running a fever, they are concerned because of his sepsis, at 5am this morning his fever was 99.8 and after tylenol it came down some. They have an appointment at 3:45, do they need to be seen sooner?

## 2016-08-21 NOTE — Addendum Note (Signed)
Addended by: Terence Lux B on: 08/21/2016 09:54 AM   Modules accepted: Orders

## 2016-08-24 ENCOUNTER — Telehealth: Payer: Self-pay | Admitting: Internal Medicine

## 2016-08-24 LAB — URINE CULTURE

## 2016-08-24 MED ORDER — NITROFURANTOIN MONOHYD MACRO 100 MG PO CAPS: 100.0000 mg | ORAL_CAPSULE | Freq: Two times a day (BID) | ORAL | 0 refills | Status: DC

## 2016-08-24 NOTE — Telephone Encounter (Signed)
Spoke with pt to inform.  

## 2016-08-24 NOTE — Telephone Encounter (Signed)
Call him - the urine culture came back and is resistant to many antibiotics, including the one he is taking.  He should stop that one and start a new one -   Sent to [facility].

## 2016-08-25 DIAGNOSIS — H35373 Puckering of macula, bilateral: Secondary | ICD-10-CM | POA: Diagnosis not present

## 2016-08-25 DIAGNOSIS — H3589 Other specified retinal disorders: Secondary | ICD-10-CM | POA: Diagnosis not present

## 2016-08-25 DIAGNOSIS — Z961 Presence of intraocular lens: Secondary | ICD-10-CM | POA: Diagnosis not present

## 2016-08-25 DIAGNOSIS — H409 Unspecified glaucoma: Secondary | ICD-10-CM | POA: Diagnosis not present

## 2016-08-26 DIAGNOSIS — N3000 Acute cystitis without hematuria: Secondary | ICD-10-CM | POA: Diagnosis not present

## 2016-08-30 DIAGNOSIS — N390 Urinary tract infection, site not specified: Secondary | ICD-10-CM

## 2016-08-30 HISTORY — DX: Urinary tract infection, site not specified: N39.0

## 2016-09-04 DIAGNOSIS — H4053X4 Glaucoma secondary to other eye disorders, bilateral, indeterminate stage: Secondary | ICD-10-CM | POA: Diagnosis not present

## 2016-09-08 ENCOUNTER — Encounter (HOSPITAL_BASED_OUTPATIENT_CLINIC_OR_DEPARTMENT_OTHER): Payer: Self-pay | Admitting: *Deleted

## 2016-09-08 ENCOUNTER — Telehealth: Payer: Self-pay | Admitting: Internal Medicine

## 2016-09-08 ENCOUNTER — Emergency Department (HOSPITAL_BASED_OUTPATIENT_CLINIC_OR_DEPARTMENT_OTHER)
Admission: EM | Admit: 2016-09-08 | Discharge: 2016-09-08 | Disposition: A | Discharge status: Home / Self Care | Payer: Medicare Other | Attending: Emergency Medicine | Admitting: Emergency Medicine

## 2016-09-08 ENCOUNTER — Emergency Department (HOSPITAL_BASED_OUTPATIENT_CLINIC_OR_DEPARTMENT_OTHER): Payer: Medicare Other

## 2016-09-08 DIAGNOSIS — R5381 Other malaise: Secondary | ICD-10-CM | POA: Diagnosis not present

## 2016-09-08 DIAGNOSIS — R5383 Other fatigue: Secondary | ICD-10-CM | POA: Diagnosis not present

## 2016-09-08 DIAGNOSIS — Z8546 Personal history of malignant neoplasm of prostate: Secondary | ICD-10-CM | POA: Insufficient documentation

## 2016-09-08 DIAGNOSIS — Z8551 Personal history of malignant neoplasm of bladder: Secondary | ICD-10-CM | POA: Diagnosis not present

## 2016-09-08 DIAGNOSIS — Z79899 Other long term (current) drug therapy: Secondary | ICD-10-CM | POA: Insufficient documentation

## 2016-09-08 DIAGNOSIS — R002 Palpitations: Secondary | ICD-10-CM | POA: Insufficient documentation

## 2016-09-08 DIAGNOSIS — R918 Other nonspecific abnormal finding of lung field: Secondary | ICD-10-CM | POA: Diagnosis not present

## 2016-09-08 LAB — URINALYSIS, MICROSCOPIC (REFLEX)

## 2016-09-08 LAB — CBC WITH DIFFERENTIAL/PLATELET
BASOS ABS: 0 10*3/uL (ref 0.0–0.1)
BASOS PCT: 0 %
EOS ABS: 0.2 10*3/uL (ref 0.0–0.7)
EOS PCT: 2 %
HCT: 33.3 % — ABNORMAL LOW (ref 39.0–52.0)
Hemoglobin: 10.9 g/dL — ABNORMAL LOW (ref 13.0–17.0)
LYMPHS ABS: 2.6 10*3/uL (ref 0.7–4.0)
Lymphocytes Relative: 21 %
MCH: 28.8 pg (ref 26.0–34.0)
MCHC: 32.7 g/dL (ref 30.0–36.0)
MCV: 87.9 fL (ref 78.0–100.0)
Monocytes Absolute: 1 10*3/uL (ref 0.1–1.0)
Monocytes Relative: 8 %
NEUTROS PCT: 69 %
Neutro Abs: 8.8 10*3/uL — ABNORMAL HIGH (ref 1.7–7.7)
PLATELETS: 433 10*3/uL — AB (ref 150–400)
RBC: 3.79 MIL/uL — ABNORMAL LOW (ref 4.22–5.81)
RDW: 13 % (ref 11.5–15.5)
WBC: 12.6 10*3/uL — AB (ref 4.0–10.5)

## 2016-09-08 LAB — URINALYSIS, ROUTINE W REFLEX MICROSCOPIC
Bilirubin Urine: NEGATIVE
GLUCOSE, UA: NEGATIVE mg/dL
Ketones, ur: NEGATIVE mg/dL
Nitrite: NEGATIVE
PH: 6.5 (ref 5.0–8.0)
Protein, ur: NEGATIVE mg/dL
SPECIFIC GRAVITY, URINE: 1.016 (ref 1.005–1.030)

## 2016-09-08 LAB — BASIC METABOLIC PANEL
Anion gap: 9 (ref 5–15)
BUN: 22 mg/dL — AB (ref 6–20)
CALCIUM: 8.9 mg/dL (ref 8.9–10.3)
CO2: 23 mmol/L (ref 22–32)
CREATININE: 1.16 mg/dL (ref 0.61–1.24)
Chloride: 107 mmol/L (ref 101–111)
GFR, EST NON AFRICAN AMERICAN: 59 mL/min — AB (ref >60)
Glucose, Bld: 92 mg/dL (ref 65–99)
Potassium: 4 mmol/L (ref 3.5–5.1)
SODIUM: 139 mmol/L (ref 135–145)

## 2016-09-08 LAB — TROPONIN I

## 2016-09-08 NOTE — Discharge Instructions (Signed)
At this time, there is no evident source of infection. Monitor your temperature for fever greater than 100.6. Return if you develop other symptoms. See your doctor for recheck within the next 1-2 days. At this time, your heart rate is normal. Discuss the episodes you are having of intermittent heart racing with your doctor and obtaining a heart rate monitor that you wear continuously to help identify what is causing your symptoms.

## 2016-09-08 NOTE — Telephone Encounter (Signed)
Spoke with Adrian Rios to inform. Adrian Rios states he will go to [facility] in [place].

## 2016-09-08 NOTE — Telephone Encounter (Signed)
He should be evaluated someone today to rule out an infection or other causes of his elevated pulse / him not feeling well.

## 2016-09-08 NOTE — Telephone Encounter (Signed)
Would you be okay with seeing him at [phone]

## 2016-09-08 NOTE — Telephone Encounter (Signed)
HR 102 @ 2:45pm denies any fever. States he doesn't feel well but relates that to the surgery. Please advise.

## 2016-09-08 NOTE — ED Notes (Addendum)
Pt states he hasn't felt great since his bladder removal surgery. States he monitors his heart rate and blood pressure at home and states he has been tachycardic intermittently for the past month. Pt recently tx for UTI. His dr wanted him evaluated today with chest xray and UA. Denies fever, denies pain. Pt denies feeling palpitations or chest pain.

## 2016-09-08 NOTE — ED Triage Notes (Addendum)
Pt sent here from PMD office for "palpatations" and generalized fatigue

## 2016-09-08 NOTE — Telephone Encounter (Signed)
I think he should go to urgent care - needs to be ruled out for sepsis - may need further evaluation that we can provide at 4:15

## 2016-09-08 NOTE — ED Provider Notes (Signed)
[facility] DEPT [facility] Provider Note   CSN: [id number] Arrival date & time: 09/08/16  1616     History   Chief Complaint Chief Complaint  Patient presents with  . Palpitations    HPI Adrian Rios is a 76 y.o. male.  HPI Patient states he has not really felt well since his bladder resection February this year. He had an admission for sepsis approximately a month ago. Patient reports she's had these generalized symptoms of fatigue and lack of energy. They seemed a little worse over the past few days. He called his family doctor's office and could not be seen and was advised to go to urgent care for a urine specimen and a chest x-ray. Patient denies he's had any cough, shortness of breath or chest pain. He has had not had any nausea vomiting or abdominal pain. No skin rashes or joint swelling. No fevers. He reports he has been treated for UTI several times. He is however aware from his conversation with is urologist that his urine specimen will always look infected coming out of the urostomy. Past Medical History:  Diagnosis Date  . Allergic rhinitis   . Arthritis   . Benign localized prostatic hyperplasia with lower urinary tract symptoms (LUTS)   . Bladder tumor   . Borderline glaucoma of right eye   . Cancer [facility])    bladder and prostate  . Carotid bruit    per duplex 03-11-2016 RICA 1-39%  . Chronic throat clearing   . DDD (degenerative disc disease), lumbar   . Depression   . ED (erectile dysfunction)   . Elevated PSA    urologist-  dr [name]--- s/p  prostate bx's  . GERD (gastroesophageal reflux disease)   . Hematuria   . History of chronic bronchitis   . 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 History of squamous cell carcinoma excision   . History of squamous cell carcinoma excision    several times  . Hyperlipidemia   . Pre-diabetes   . Premature ventricular contractions (PVCs) (VPCs)   . RAD (reactive airway disease)   .  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 Sensorineural hearing loss (SNHL) of both ears   . Wears glasses   . Wears hearing aid    bilateral    Patient Active Problem List   Diagnosis Date Noted  . Fatigue 08/19/2016  . Malnutrition of moderate degree 08/05/2016  . Sepsis ([place]) 08/04/2016  . History of bladder cancer 06/18/2016  . History of prostate cancer 06/18/2016  . 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 Bladder cancer ([place]) 06/11/2016  . Fever 05/16/2016  . Carotid bruit 02/17/2016  . Prediabetes 08/20/2015  . Overweight (BMI 25.0-29.9) 08/20/2015  . Esophageal reflux 08/20/2015  . Glaucoma 02/14/2015  . Varicose veins of lower extremities with other complications 20/25/4270  . RAD (reactive airway disease) 10/27/2011  . 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 Anemia, unspecified 10/27/2011  . 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 ERECTILE DYSFUNCTION 04/23/2010  . [place] DISEASE, LUMBOSACRAL SPINE 02/06/2010  . 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 Depression 04/12/2008  . Allergic rhinitis 04/12/2008  . ELEVATED PROSTATE SPECIFIC ANTIGEN 04/12/2008  . Hyperlipidemia 10/05/2006  . Sarcoidosis ([place]) 07/05/2006    Past Surgical History:  Procedure Laterality Date  . APPENDECTOMY  2008  . CARDIOVASCULAR  STRESS TEST  05/27/2010   normal nuclear study w/ no ischemia/  normal LV function and wall motion , ef 60%  . CYSTOSCOPY WITH INJECTION N/A 06/12/2016   Procedure: CYSTOSCOPY WITH INJECTION OF INDOCYANINE GREEN DYE;  Surgeon: Alexis Frock, MD;  Location: [facility];  Service: Urology;  Laterality: N/A;  . DEEP NECK LYMPH NODE BIOPSY / EXCISION  1970's   and Bronchoscopy w/ bx's ( dx Sarcoidosis)  . ILEOSTOMY    . MOHS SURGERY  2013 approx.    nasal; pre melanoma  . PARS PLANA VITRECTOMY Right 2007   repair macular pucker  . TONSILLECTOMY AND ADENOIDECTOMY  child  .  TRANSURETHRAL RESECTION OF BLADDER TUMOR N/A 04/06/2016   Procedure: CYSTOSCOPY TRANSURETHRAL RESECTION OF BLADDER TUMORS (TURBT);  Surgeon: Carolan Clines, MD;  Location: [facility];  Service: Urology;  Laterality: N/A;  . TRANSURETHRAL RESECTION OF PROSTATE         Home Medications    Prior to Admission medications   Medication Sig Start Date End Date Taking? Authorizing Provider  ADVAIR DISKUS 250-50 MCG/DOSE AEPB INHALE 1 DOSE BY MOUTH EVERY 12 HOURS 08/21/16   Burns, Claudina Lick, MD  albuterol (PROVENTIL HFA;VENTOLIN HFA) 108 (90 Base) MCG/ACT inhaler Inhale 2 puffs into the lungs every 6 (six) hours as needed for wheezing or shortness of breath. 08/27/15   Burns, Claudina Lick, MD  magnesium oxide (MAG-OX) 400 MG tablet Take 400 mg by mouth daily.    [provider]  Misc Natural Products (GLUCOSAMINE CHOND DOUBLE STR PO) Take 1 tablet by mouth 2 (two) times daily.    [provider]  montelukast (SINGULAIR) 10 MG tablet TAKE ONE TABLET BY MOUTH ONCE DAILY 08/21/16   [name], MD  nitrofurantoin, macrocrystal-monohydrate, (MACROBID) 100 MG capsule Take 1 capsule (100 mg total) by mouth 2 (two) times daily. 08/24/16   [name], MD  ranitidine (ZANTAC) 150 MG tablet TAKE ONE TABLET BY MOUTH AT BEDTIME 07/01/16   Burns, Claudina Lick, MD  saccharomyces boulardii (FLORASTOR) 250 MG capsule Take 1 capsule (250 mg total) by mouth 2 (two) times daily. 08/06/16   Donne Hazel, MD  triamcinolone (NASACORT ALLERGY 24HR) 55 MCG/ACT AERO nasal inhaler Place 1 spray into the nose at bedtime.    [provider]  venlafaxine (EFFEXOR) 75 MG tablet TAKE ONE TABLET BY MOUTH ONCE DAILY 06/25/16   [name], MD    Family History Family History  Problem Relation Age of Onset  . Stroke Mother        in her 23s  . Breast cancer Sister   . Heart disease Maternal Grandmother 84       MI   . Breast cancer Paternal Grandmother   . Heart disease Paternal  Grandfather 34       MI  . Stroke Brother   . Diabetes Neg Hx     Social History Social History  Substance Use Topics  . Smoking status: Never Smoker  . Smokeless tobacco: Never Used  . Alcohol use No     Allergies   Ivp dye [iodinated diagnostic agents]; Shellfish allergy; Gabapentin; Niacin-lovastatin er; and Adhesive [tape]   Review of Systems Review of Systems 10 Systems reviewed and are negative for acute change except as noted in the HPI.   Physical Exam Updated Vital Signs BP 117/76 (BP Location: Right Arm)   Pulse 77   Temp 98.1 F (36.7 C)   Resp 20   Ht  5\' 7"  (1.702 m)   Wt 72.6 kg (160 lb)   SpO2 95%   BMI 25.06 kg/m   Physical Exam  Constitutional: He appears well-developed and well-nourished.  HENT:  Head: Normocephalic and atraumatic.  Eyes: Conjunctivae are normal.  Neck: Neck supple.  Cardiovascular: Normal rate and regular rhythm.   No murmur heard. Pulmonary/Chest: Effort normal and breath sounds normal. No respiratory distress.  Abdominal: Soft. There is no tenderness.  Abdomen is soft and nontender. He has a urostomy bag in place.  Musculoskeletal: He exhibits no edema.  Neurological: He is alert.  Skin: Skin is warm and dry.  Psychiatric: He has a normal mood and affect.  Nursing note and vitals reviewed.    ED Treatments / Results  Labs (all labs ordered are listed, but only abnormal results are displayed) Labs Reviewed  URINALYSIS, ROUTINE W REFLEX MICROSCOPIC - Abnormal; Notable for the following:       Result Value   APPearance TURBID (*)    Hgb urine dipstick SMALL (*)    Leukocytes, UA LARGE (*)    All other components within normal limits  URINALYSIS, MICROSCOPIC (REFLEX) - Abnormal; Notable for the following:    Bacteria, UA MANY (*)    Squamous Epithelial / LPF 6-30 (*)    All other components within normal limits  BASIC METABOLIC PANEL - Abnormal; Notable for the following:    BUN 22 (*)    GFR calc non Af Amer 59  (*)    All other components within normal limits  CBC WITH DIFFERENTIAL/PLATELET - Abnormal; Notable for the following:    WBC 12.6 (*)    RBC 3.79 (*)    Hemoglobin 10.9 (*)    HCT 33.3 (*)    Platelets 433 (*)    Neutro Abs 8.8 (*)    All other components within normal limits  TROPONIN I    EKG  EKG Interpretation  Date/Time:  Tuesday September 08 2016 16:29:27 EDT Ventricular Rate:  85 PR Interval:  138 QRS Duration: 86 QT Interval:  350 QTC Calculation: 416 R Axis:   68 Text Interpretation:  Normal sinus rhythm Normal ECG agree. no change Confirmed by Charlesetta Shanks [phone]) on 09/08/2016 6:04:00 PM       Radiology Dg Chest 2 View  Result Date: 09/08/2016 CLINICAL DATA:  Malaise history of sarcoid EXAM: CHEST  2 VIEW COMPARISON:  08/05/2016 FINDINGS: No focal pulmonary infiltrate, consolidation, or pleural effusion. Minimal linear atelectasis or scarring at the bases. Normal heart size. Aortic atherosclerosis. No pneumothorax. Probable nipple shadow right lower lung. IMPRESSION: No active cardiopulmonary disease. Electronically Signed   By: Donavan Foil M.D.   On: 09/08/2016 18:36    Procedures Procedures (including critical care time)  Medications Ordered in ED Medications - No data to display   Initial Impression / Assessment and Plan / ED Course  I have reviewed the triage vital signs and the nursing notes.  Pertinent labs & imaging results that were available during my care of the patient were reviewed by me and considered in my medical decision making (see chart for details).     Final Clinical Impressions(s) / ED Diagnoses   Final diagnoses:  Malaise and fatigue  Heart palpitations   Patient is clinically well. He has had general symptoms of malaise. This time no focal sign of infection. Urinalysis is chronically having large leukocytosis. Will signs are stable. I feel he is stable to follow-up with his PCP this week. He is counseled  on signs and symptoms  first return. New Prescriptions New Prescriptions   No medications on file     Charlesetta Shanks, MD 09/08/16 [phone]

## 2016-09-08 NOTE — ED Notes (Signed)
Patient transported to X-ray 

## 2016-09-08 NOTE — Telephone Encounter (Signed)
Pt would like a call back regarding his pulse this morning it was 114 at 8am

## 2016-09-08 NOTE — ED Notes (Signed)
Pt verbalized understanding of discharge instructions and denies any further questions at this time.   

## 2016-09-09 ENCOUNTER — Encounter: Payer: Self-pay | Admitting: Internal Medicine

## 2016-09-10 NOTE — Telephone Encounter (Signed)
Pt contacted the office again. Wanting to know if he would be okay to go ahead and leave for vacation.

## 2016-09-10 NOTE — Telephone Encounter (Signed)
The bleeding needs to be evaluated, but if he is bleeding a small amount it may be able to wait.  I can refer to GI now if he agrees.  How much bleeding is there?  If it increases if may need to be evaluated sooner. If not I think GI will be able to see him the week of the 23rd.

## 2016-09-10 NOTE — Telephone Encounter (Signed)
Pt states to call his cell number they will not be at home

## 2016-09-10 NOTE — Telephone Encounter (Signed)
Pt called about this issue.

## 2016-09-10 NOTE — Telephone Encounter (Signed)
Tried contacting pt, unable to LVM 

## 2016-09-11 NOTE — Telephone Encounter (Signed)
Agree - possibly hemorrhoidal bleed

## 2016-09-11 NOTE — Telephone Encounter (Signed)
Spoke with pt, states the toilet water was pink but not red 7/11 pm and 7/12 am, but did not see any blood this am. States he has a BM daily, no pain when using the bathroom but usually has to strain to start. Advise he could use a stole softener. And to continue to monitor the amount of blood seen.

## 2016-09-14 NOTE — Anesthesia Postprocedure Evaluation (Signed)
Anesthesia Post Note  Patient: Adrian Rios  Procedure(s) Performed: Procedure(s) (LRB): ROBOT ASSISTED LAPAROSCOPIC RADICAL CYSTOPROSTATECTOMY BILATERAL PELVIC LYMPHADENECTOMY,ILEAL CONDUIT (N/A) CYSTOSCOPY WITH INJECTION OF INDOCYANINE [name] DYE (N/A)     Anesthesia Post Evaluation  Last Vitals:  Vitals:   06/17/16 2001 06/18/16 0444  BP: 103/68 116/70  Pulse: 85 65  Resp: 18 18  Temp: 36.6 C 37.2 C    Last Pain:  Vitals:   06/18/16 0829  TempSrc:   PainSc: 7                  Thomasena Vandenheuvel

## 2016-09-14 NOTE — Addendum Note (Signed)
Addendum  created 09/14/16 1532 by Belinda Block, MD   Sign clinical note

## 2016-09-18 DIAGNOSIS — Z906 Acquired absence of other parts of urinary tract: Secondary | ICD-10-CM | POA: Diagnosis not present

## 2016-09-18 DIAGNOSIS — N390 Urinary tract infection, site not specified: Secondary | ICD-10-CM | POA: Diagnosis not present

## 2016-09-18 DIAGNOSIS — Z888 Allergy status to other drugs, medicaments and biological substances status: Secondary | ICD-10-CM | POA: Diagnosis not present

## 2016-09-18 DIAGNOSIS — J45909 Unspecified asthma, uncomplicated: Secondary | ICD-10-CM | POA: Diagnosis not present

## 2016-09-18 DIAGNOSIS — Z8551 Personal history of malignant neoplasm of bladder: Secondary | ICD-10-CM | POA: Diagnosis not present

## 2016-09-22 DIAGNOSIS — N3000 Acute cystitis without hematuria: Secondary | ICD-10-CM | POA: Diagnosis not present

## 2016-09-25 ENCOUNTER — Encounter: Payer: Self-pay | Admitting: Internal Medicine

## 2016-09-27 ENCOUNTER — Encounter: Payer: Self-pay | Admitting: Internal Medicine

## 2016-09-27 DIAGNOSIS — K5900 Constipation, unspecified: Secondary | ICD-10-CM

## 2016-09-28 ENCOUNTER — Other Ambulatory Visit: Payer: Self-pay | Admitting: Internal Medicine

## 2016-09-28 NOTE — Telephone Encounter (Signed)
Patient has surgery on April 13 for bladder removal.  States ever since then he has had a hard time getting his bowels to move.  This is why patient is requesting referral to GI.

## 2016-09-29 NOTE — Telephone Encounter (Signed)
Patient has called back in regard.  States he spoke with Geni Bers yesterday but he is not sure if [name] is going to enter the referral or not.  Please follow up with patient at [phone].

## 2016-09-29 NOTE — Telephone Encounter (Signed)
Let him know GI referral was ordered

## 2016-09-30 ENCOUNTER — Encounter: Payer: Self-pay | Admitting: Internal Medicine

## 2016-09-30 DIAGNOSIS — A419 Sepsis, unspecified organism: Secondary | ICD-10-CM

## 2016-09-30 HISTORY — DX: Sepsis, unspecified organism: A41.9

## 2016-10-08 DIAGNOSIS — D1801 Hemangioma of skin and subcutaneous tissue: Secondary | ICD-10-CM | POA: Diagnosis not present

## 2016-10-08 DIAGNOSIS — D2272 Melanocytic nevi of left lower limb, including hip: Secondary | ICD-10-CM | POA: Diagnosis not present

## 2016-10-08 DIAGNOSIS — D225 Melanocytic nevi of trunk: Secondary | ICD-10-CM | POA: Diagnosis not present

## 2016-10-08 DIAGNOSIS — L570 Actinic keratosis: Secondary | ICD-10-CM | POA: Diagnosis not present

## 2016-10-08 DIAGNOSIS — D2271 Melanocytic nevi of right lower limb, including hip: Secondary | ICD-10-CM | POA: Diagnosis not present

## 2016-10-08 DIAGNOSIS — L821 Other seborrheic keratosis: Secondary | ICD-10-CM | POA: Diagnosis not present

## 2016-10-08 DIAGNOSIS — L814 Other melanin hyperpigmentation: Secondary | ICD-10-CM | POA: Diagnosis not present

## 2016-10-09 ENCOUNTER — Encounter: Payer: Self-pay | Admitting: Internal Medicine

## 2016-10-09 DIAGNOSIS — Z8546 Personal history of malignant neoplasm of prostate: Secondary | ICD-10-CM | POA: Diagnosis not present

## 2016-10-09 DIAGNOSIS — R509 Fever, unspecified: Secondary | ICD-10-CM

## 2016-10-09 DIAGNOSIS — Z8551 Personal history of malignant neoplasm of bladder: Secondary | ICD-10-CM | POA: Diagnosis not present

## 2016-10-09 DIAGNOSIS — R000 Tachycardia, unspecified: Secondary | ICD-10-CM

## 2016-10-10 ENCOUNTER — Emergency Department (HOSPITAL_COMMUNITY)
Admission: EM | Admit: 2016-10-10 | Discharge: 2016-10-10 | Disposition: A | Discharge status: Home / Self Care | Payer: Medicare Other | Attending: Emergency Medicine | Admitting: Emergency Medicine

## 2016-10-10 ENCOUNTER — Ambulatory Visit: Payer: Medicare Other | Admitting: Family Medicine

## 2016-10-10 ENCOUNTER — Encounter (HOSPITAL_COMMUNITY): Payer: Self-pay

## 2016-10-10 DIAGNOSIS — R509 Fever, unspecified: Secondary | ICD-10-CM | POA: Insufficient documentation

## 2016-10-10 DIAGNOSIS — Z5321 Procedure and treatment not carried out due to patient leaving prior to being seen by health care provider: Secondary | ICD-10-CM | POA: Insufficient documentation

## 2016-10-10 DIAGNOSIS — I499 Cardiac arrhythmia, unspecified: Secondary | ICD-10-CM | POA: Diagnosis not present

## 2016-10-10 LAB — CBC WITH DIFFERENTIAL/PLATELET
BASOS PCT: 0 %
Basophils Absolute: 0 10*3/uL (ref 0.0–0.1)
EOS ABS: 0.2 10*3/uL (ref 0.0–0.7)
EOS PCT: 2 %
HCT: 33.8 % — ABNORMAL LOW (ref 39.0–52.0)
Hemoglobin: 11.1 g/dL — ABNORMAL LOW (ref 13.0–17.0)
LYMPHS ABS: 2.6 10*3/uL (ref 0.7–4.0)
Lymphocytes Relative: 19 %
MCH: 27.8 pg (ref 26.0–34.0)
MCHC: 32.8 g/dL (ref 30.0–36.0)
MCV: 84.5 fL (ref 78.0–100.0)
Monocytes Absolute: 1 10*3/uL (ref 0.1–1.0)
Monocytes Relative: 7 %
Neutro Abs: 10.1 10*3/uL — ABNORMAL HIGH (ref 1.7–7.7)
Neutrophils Relative %: 72 %
PLATELETS: 507 10*3/uL — AB (ref 150–400)
RBC: 4 MIL/uL — AB (ref 4.22–5.81)
RDW: 13.9 % (ref 11.5–15.5)
WBC: 14 10*3/uL — AB (ref 4.0–10.5)

## 2016-10-10 LAB — COMPREHENSIVE METABOLIC PANEL
ALK PHOS: 109 U/L (ref 38–126)
ALT: 16 U/L — ABNORMAL LOW (ref 17–63)
AST: 18 U/L (ref 15–41)
Albumin: 3.3 g/dL — ABNORMAL LOW (ref 3.5–5.0)
Anion gap: 9 (ref 5–15)
BILIRUBIN TOTAL: 0.5 mg/dL (ref 0.3–1.2)
BUN: 23 mg/dL — AB (ref 6–20)
CALCIUM: 9.4 mg/dL (ref 8.9–10.3)
CHLORIDE: 107 mmol/L (ref 101–111)
CO2: 22 mmol/L (ref 22–32)
CREATININE: 1.11 mg/dL (ref 0.61–1.24)
GFR calc Af Amer: >60 mL/min (ref >60)
Glucose, Bld: 122 mg/dL — ABNORMAL HIGH (ref 65–99)
Potassium: 4.1 mmol/L (ref 3.5–5.1)
Sodium: 138 mmol/L (ref 135–145)
Total Protein: 8.4 g/dL — ABNORMAL HIGH (ref 6.5–8.1)

## 2016-10-10 LAB — I-STAT CG4 LACTIC ACID, ED: LACTIC ACID, VENOUS: 1 mmol/L (ref 0.5–1.9)

## 2016-10-10 NOTE — ED Notes (Signed)
Pt left prior to being seen.  Name was called x 2 with no answer.

## 2016-10-10 NOTE — ED Triage Notes (Signed)
PT SENT BY HIS PCP FOR IRREGULAR HEART BEAT AND FEVER SINCE LAST NIGHT. PT STS HE HAS BEEN HAVING RECURRENT FEVERS, INFECTIONS, AND IRREGULAR HEART RHYTHMS SINCE HAVING BLADDER CANCER SURGERY SINCE 06/12/2016. PT STS HE HAD A FEVER OF 101. DENIES CHEST PAIN, SOB, OR PAIN OF ANY KIND.

## 2016-10-12 NOTE — Telephone Encounter (Addendum)
Pt called wanting to know if you could check on the blood work that was done at the ED. I told him that I did see his MyChart message and that it was sent to Dr Quay Burow.

## 2016-10-13 NOTE — Addendum Note (Signed)
Addended by: [name] on: 10/13/2016 08:45 PM   Modules accepted: Orders

## 2016-10-18 ENCOUNTER — Encounter: Payer: Self-pay | Admitting: Internal Medicine

## 2016-10-19 ENCOUNTER — Encounter: Payer: Self-pay | Admitting: Interventional Cardiology

## 2016-10-19 ENCOUNTER — Encounter: Payer: Self-pay | Admitting: Internal Medicine

## 2016-10-19 ENCOUNTER — Ambulatory Visit (INDEPENDENT_AMBULATORY_CARE_PROVIDER_SITE_OTHER): Payer: Medicare Other | Admitting: Interventional Cardiology

## 2016-10-19 VITALS — BP 102/60 | HR 82 | Ht 67.0 in | Wt 162.6 lb

## 2016-10-19 DIAGNOSIS — R000 Tachycardia, unspecified: Secondary | ICD-10-CM | POA: Diagnosis not present

## 2016-10-19 DIAGNOSIS — R0609 Other forms of dyspnea: Secondary | ICD-10-CM

## 2016-10-19 NOTE — Patient Instructions (Signed)
Medication Instructions:  Your physician recommends that you continue on your current medications as directed. Please refer to the Current Medication list given to you today.   Labwork: None ordered  Testing/Procedures: Your physician has recommended that you wear a holter monitor. Holter monitors are medical devices that record the heart's electrical activity. Doctors most often use these monitors to diagnose arrhythmias. Arrhythmias are problems with the speed or rhythm of the heartbeat. The monitor is a small, portable device. You can wear one while you do your normal daily activities. This is usually used to diagnose what is causing palpitations/syncope (passing out).   Follow-Up: Based on test results  Any Other Special Instructions Will Be Listed Below (If Applicable).     If you need a refill on your cardiac medications before your next appointment, please call your pharmacy.

## 2016-10-19 NOTE — Progress Notes (Signed)
Cardiology Office Note   Date:  10/19/2016   ID:  Adrian Rios, DOB [birth year], MRN [id number]  PCP:  [name], MD    No chief complaint on file. tachycardia   Wt Readings from Last 3 Encounters:  10/19/16 162 lb 9.6 oz (73.8 kg)  10/10/16 160 lb (72.6 kg)  09/08/16 160 lb (72.6 kg)       History of Present Illness:  Adrian Rios is a 76 y.o. male who is being seen today for the evaluation of tachycardia at the request of Burns, Claudina Lick, MD.  Adrian Rios is a 76 y.o. male  With a h/o bladder cancer.  He has had surgery in 4/18.  He had severeal UTIs.  In June 2018, he had urosepsis.  He had a heart rate up to 140.  It came down.    He checks BP at home, and has had HR up to 125 bpm.  More frequently 115, but he is asymptomatic.   Average reading around 95 bpm.    Stamina decreased since the surgery.  No regular walking program.  Some DOE.   Denies : Chest pain. Dizziness. Leg edema. Nitroglycerin use. Orthopnea. Palpitations. Paroxysmal nocturnal dyspnea. Syncope.   He had a stress test many years ago.  No prior cath done.  He has had PVCs in the past.       Past Medical History:  Diagnosis Date  . Allergic rhinitis   . Arthritis   . Benign localized prostatic hyperplasia with lower urinary tract symptoms (LUTS)   . Bladder tumor   . Borderline glaucoma of right eye   . Cancer [facility])    bladder and prostate  . Carotid bruit    per duplex 03-11-2016 RICA 1-39%  . Chronic throat clearing   . DDD (degenerative disc disease), lumbar   . Depression   . ED (erectile dysfunction)   . Elevated PSA    urologist-  dr [name]--- s/p  prostate bx's  . GERD (gastroesophageal reflux disease)   . Hematuria   . History of chronic bronchitis   . 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 History of squamous cell carcinoma excision   . History of squamous cell carcinoma excision    several times  . Hyperlipidemia   . Pre-diabetes     . Premature ventricular contractions (PVCs) (VPCs)   . RAD (reactive airway disease)   . 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 Sensorineural hearing loss (SNHL) of both ears   . Wears glasses   . Wears hearing aid    bilateral    Past Surgical History:  Procedure Laterality Date  . APPENDECTOMY  2008  . CARDIOVASCULAR STRESS TEST  05/27/2010   normal nuclear study w/ no ischemia/  normal LV function and wall motion , ef 60%  . CYSTOSCOPY WITH INJECTION N/A 06/12/2016   Procedure: CYSTOSCOPY WITH INJECTION OF INDOCYANINE GREEN DYE;  Surgeon: Alexis Frock, MD;  Location: [facility];  Service: Urology;  Laterality: N/A;  . DEEP NECK LYMPH NODE BIOPSY / EXCISION  1970's   and Bronchoscopy w/ bx's ( dx Sarcoidosis)  . ILEOSTOMY    . MOHS SURGERY  2013 approx.    nasal; pre melanoma  . PARS PLANA VITRECTOMY Right 2007   repair macular pucker  .  TONSILLECTOMY AND ADENOIDECTOMY  child  . TRANSURETHRAL RESECTION OF BLADDER TUMOR N/A 04/06/2016   Procedure: CYSTOSCOPY TRANSURETHRAL RESECTION OF BLADDER TUMORS (TURBT);  Surgeon: Carolan Clines, MD;  Location: [facility];  Service: Urology;  Laterality: N/A;  . TRANSURETHRAL RESECTION OF PROSTATE       Current Outpatient Prescriptions  Medication Sig Dispense Refill  . ADVAIR DISKUS 250-50 MCG/DOSE AEPB INHALE 1 DOSE BY MOUTH EVERY 12 HOURS 60 each 1  . albuterol (PROVENTIL HFA;VENTOLIN HFA) 108 (90 Base) MCG/ACT inhaler Inhale 2 puffs into the lungs every 6 (six) hours as needed for wheezing or shortness of breath. 1 Inhaler 8  . Glucosamine-Chondroitin 750-600 MG TABS Take 1 tablet by mouth daily.    . magnesium oxide (MAG-OX) 400 MG tablet Take 400 mg by mouth daily.    . Misc Natural Products (GLUCOSAMINE CHOND DOUBLE STR PO) Take 1 tablet by mouth 2 (two) times daily.    . montelukast (SINGULAIR) 10 MG tablet TAKE ONE TABLET BY MOUTH ONCE  DAILY 90 tablet 3  . ranitidine (ZANTAC) 150 MG tablet TAKE ONE TABLET BY MOUTH AT BEDTIME 90 tablet 1  . triamcinolone (NASACORT ALLERGY 24HR) 55 MCG/ACT AERO nasal inhaler Place 1 spray into the nose at bedtime.    Marland Kitchen venlafaxine (EFFEXOR) 75 MG tablet TAKE 1 TABLET BY MOUTH ONCE DAILY 90 tablet 1   No current facility-administered medications for this visit.     Allergies:   Ivp dye [iodinated diagnostic agents]; Shellfish allergy; Gabapentin; Niacin-lovastatin er; and Adhesive [tape]    Social History:  The patient  reports that he has never smoked. He has never used smokeless tobacco. He reports that he does not drink alcohol or use drugs.   Family History:  The patient's family history includes Breast cancer in his paternal grandmother and sister; Heart disease (age of onset: 31) in his maternal grandmother; Heart disease (age of onset: 51) in his paternal grandfather; Stroke in his brother and mother.  No early CAD   ROS:  Please see the history of present illness.   Otherwise, review of systems are positive for DOE.   All other systems are reviewed and negative.    PHYSICAL EXAM: VS:  BP 102/60   Pulse 82   Ht 5\' 7"  (1.702 m)   Wt 162 lb 9.6 oz (73.8 kg)   SpO2 96%   BMI 25.47 kg/m  , BMI Body mass index is 25.47 kg/m. GEN: Well nourished, well developed, in no acute distress  HEENT: normal  Neck: no JVD, carotid bruits, or masses Cardiac: RRR; no murmurs, rubs, or gallops,no edema  Respiratory:  clear to auscultation bilaterally, normal work of breathing GI: soft, nontender, nondistended, + BS MS: no deformity or atrophy  Skin: warm and dry, no rash Neuro:  Strength and sensation are intact Psych: euthymic mood, full affect   EKG:   The ekg ordered 10/10/16 demonstrates NSR, PVC, no ST changes   Recent Labs: 08/06/2016: Magnesium 1.8 08/19/2016: TSH 1.58 10/10/2016: ALT 16; BUN 23; Creatinine, Ser 1.11; Hemoglobin 11.1; Platelets 507; Potassium 4.1; Sodium 138    Lipid Panel    Component Value Date/Time   CHOL 168 02/17/2016 1030   CHOL 213 (H) 11/15/2013 0923   TRIG 138.0 02/17/2016 1030   TRIG 201 (H) 11/15/2013 0923   HDL 44.80 02/17/2016 1030   HDL 47 11/15/2013 0923   CHOLHDL 4 02/17/2016 1030   VLDL 27.6 02/17/2016 1030   LDLCALC 96 02/17/2016 1030  [place] 126 (H) 11/15/2013 2563     Other studies Reviewed: Additional studies/ records that were reviewed today with results demonstrating: ECG from 6/18, 7/18 and 8/18 reviewed.   ASSESSMENT AND PLAN:  1. Tachycardia: Not causing palpitations, but something he notices when he checks his blood pressure. The pulse reading is increased.  ECG from 6/18 shows sinus tach.  No signs of heart failure. Cardiac exam is essentially normal. Plan for 24-hour Holter monitor to see what his heart rate does. He says that every day, he has some degree of tachycardia based on his blood pressure machine. 2. Hopefully, all he is having is some intermittent sinus tachycardia. If there is anything more significant, would have to consider echocardiogram. 3. Shortness of breath: Should improve with more exercise and improvement in stamina. He has been limited by his urologic surgery followed by several urinary tract infections. I think this is deconditioning mostly. If it persists, could consider echocardiogram.   Current medicines are reviewed at length with the patient today.  The patient concerns regarding his medicines were addressed.  The following changes have been made:  No change  Labs/ tests ordered today include:  No orders of the defined types were placed in this encounter.   Recommend 150 minutes/week of aerobic exercise Low fat, low carb, high fiber diet recommended  Disposition:   FU based on Holter results.   Signed, Larae Grooms, MD  10/19/2016 4:04 PM    [facility] [address], [place], [place]  [address] Phone: [phone]; Fax: [phone]

## 2016-10-21 ENCOUNTER — Ambulatory Visit (INDEPENDENT_AMBULATORY_CARE_PROVIDER_SITE_OTHER): Payer: Medicare Other

## 2016-10-21 DIAGNOSIS — R000 Tachycardia, unspecified: Secondary | ICD-10-CM

## 2016-10-25 ENCOUNTER — Emergency Department (HOSPITAL_BASED_OUTPATIENT_CLINIC_OR_DEPARTMENT_OTHER): Payer: Medicare Other

## 2016-10-25 ENCOUNTER — Other Ambulatory Visit: Payer: Self-pay

## 2016-10-25 ENCOUNTER — Encounter (HOSPITAL_BASED_OUTPATIENT_CLINIC_OR_DEPARTMENT_OTHER): Payer: Self-pay | Admitting: Emergency Medicine

## 2016-10-25 ENCOUNTER — Inpatient Hospital Stay (HOSPITAL_BASED_OUTPATIENT_CLINIC_OR_DEPARTMENT_OTHER)
Admission: EM | Admit: 2016-10-25 | Discharge: 2016-10-28 | DRG: 872 | Disposition: A | Discharge status: Home / Self Care | Payer: Medicare Other | Attending: Internal Medicine | Admitting: Internal Medicine

## 2016-10-25 ENCOUNTER — Encounter: Payer: Self-pay | Admitting: Internal Medicine

## 2016-10-25 DIAGNOSIS — C679 Malignant neoplasm of bladder, unspecified: Secondary | ICD-10-CM | POA: Diagnosis present

## 2016-10-25 DIAGNOSIS — R7303 Prediabetes: Secondary | ICD-10-CM | POA: Diagnosis present

## 2016-10-25 DIAGNOSIS — R6510 Systemic inflammatory response syndrome (SIRS) of non-infectious origin without acute organ dysfunction: Secondary | ICD-10-CM | POA: Diagnosis present

## 2016-10-25 DIAGNOSIS — Z906 Acquired absence of other parts of urinary tract: Secondary | ICD-10-CM

## 2016-10-25 DIAGNOSIS — Z823 Family history of stroke: Secondary | ICD-10-CM | POA: Diagnosis not present

## 2016-10-25 DIAGNOSIS — N136 Pyonephrosis: Secondary | ICD-10-CM | POA: Diagnosis present

## 2016-10-25 DIAGNOSIS — A419 Sepsis, unspecified organism: Secondary | ICD-10-CM | POA: Diagnosis not present

## 2016-10-25 DIAGNOSIS — Z91013 Allergy to seafood: Secondary | ICD-10-CM | POA: Diagnosis not present

## 2016-10-25 DIAGNOSIS — Z936 Other artificial openings of urinary tract status: Secondary | ICD-10-CM

## 2016-10-25 DIAGNOSIS — Z8546 Personal history of malignant neoplasm of prostate: Secondary | ICD-10-CM

## 2016-10-25 DIAGNOSIS — K219 Gastro-esophageal reflux disease without esophagitis: Secondary | ICD-10-CM | POA: Diagnosis present

## 2016-10-25 DIAGNOSIS — R0602 Shortness of breath: Secondary | ICD-10-CM | POA: Diagnosis not present

## 2016-10-25 DIAGNOSIS — R0902 Hypoxemia: Secondary | ICD-10-CM | POA: Diagnosis present

## 2016-10-25 DIAGNOSIS — N400 Enlarged prostate without lower urinary tract symptoms: Secondary | ICD-10-CM | POA: Diagnosis present

## 2016-10-25 DIAGNOSIS — N390 Urinary tract infection, site not specified: Secondary | ICD-10-CM | POA: Diagnosis not present

## 2016-10-25 DIAGNOSIS — Z87440 Personal history of urinary (tract) infections: Secondary | ICD-10-CM

## 2016-10-25 DIAGNOSIS — Z8551 Personal history of malignant neoplasm of bladder: Secondary | ICD-10-CM | POA: Diagnosis not present

## 2016-10-25 DIAGNOSIS — C678 Malignant neoplasm of overlapping sites of bladder: Secondary | ICD-10-CM | POA: Diagnosis not present

## 2016-10-25 DIAGNOSIS — D869 Sarcoidosis, unspecified: Secondary | ICD-10-CM | POA: Diagnosis not present

## 2016-10-25 DIAGNOSIS — Z8619 Personal history of other infectious and parasitic diseases: Secondary | ICD-10-CM

## 2016-10-25 DIAGNOSIS — R509 Fever, unspecified: Secondary | ICD-10-CM | POA: Diagnosis not present

## 2016-10-25 DIAGNOSIS — I5030 Unspecified diastolic (congestive) heart failure: Secondary | ICD-10-CM | POA: Diagnosis not present

## 2016-10-25 DIAGNOSIS — Z91041 Radiographic dye allergy status: Secondary | ICD-10-CM

## 2016-10-25 DIAGNOSIS — Z8249 Family history of ischemic heart disease and other diseases of the circulatory system: Secondary | ICD-10-CM | POA: Diagnosis not present

## 2016-10-25 DIAGNOSIS — E785 Hyperlipidemia, unspecified: Secondary | ICD-10-CM | POA: Diagnosis present

## 2016-10-25 DIAGNOSIS — N179 Acute kidney failure, unspecified: Secondary | ICD-10-CM | POA: Diagnosis not present

## 2016-10-25 DIAGNOSIS — N10 Acute tubulo-interstitial nephritis: Secondary | ICD-10-CM | POA: Diagnosis not present

## 2016-10-25 DIAGNOSIS — Z9079 Acquired absence of other genital organ(s): Secondary | ICD-10-CM

## 2016-10-25 LAB — CBC WITH DIFFERENTIAL/PLATELET
Basophils Absolute: 0 10*3/uL (ref 0.0–0.1)
Basophils Relative: 0 %
EOS ABS: 0.2 10*3/uL (ref 0.0–0.7)
EOS PCT: 1 %
HCT: 35 % — ABNORMAL LOW (ref 39.0–52.0)
HEMOGLOBIN: 11.3 g/dL — AB (ref 13.0–17.0)
LYMPHS ABS: 1.6 10*3/uL (ref 0.7–4.0)
Lymphocytes Relative: 10 %
MCH: 27.4 pg (ref 26.0–34.0)
MCHC: 32.3 g/dL (ref 30.0–36.0)
MCV: 85 fL (ref 78.0–100.0)
Monocytes Absolute: 1 10*3/uL (ref 0.1–1.0)
Monocytes Relative: 6 %
NEUTROS PCT: 83 %
Neutro Abs: 13.4 10*3/uL — ABNORMAL HIGH (ref 1.7–7.7)
PLATELETS: 496 10*3/uL — AB (ref 150–400)
RBC: 4.12 MIL/uL — AB (ref 4.22–5.81)
RDW: 14.5 % (ref 11.5–15.5)
WBC: 16.2 10*3/uL — AB (ref 4.0–10.5)

## 2016-10-25 LAB — URINALYSIS, ROUTINE W REFLEX MICROSCOPIC
BILIRUBIN URINE: NEGATIVE
Glucose, UA: NEGATIVE mg/dL
KETONES UR: NEGATIVE mg/dL
NITRITE: POSITIVE — AB
PROTEIN: NEGATIVE mg/dL
SPECIFIC GRAVITY, URINE: 1.014 (ref 1.005–1.030)
pH: 6.5 (ref 5.0–8.0)

## 2016-10-25 LAB — COMPREHENSIVE METABOLIC PANEL
ALK PHOS: 109 U/L (ref 38–126)
ALT: 18 U/L (ref 17–63)
ANION GAP: 8 (ref 5–15)
AST: 26 U/L (ref 15–41)
Albumin: 3.5 g/dL (ref 3.5–5.0)
BILIRUBIN TOTAL: 0.1 mg/dL — AB (ref 0.3–1.2)
BUN: 26 mg/dL — ABNORMAL HIGH (ref 6–20)
CO2: 21 mmol/L — ABNORMAL LOW (ref 22–32)
Calcium: 9.2 mg/dL (ref 8.9–10.3)
Chloride: 107 mmol/L (ref 101–111)
Creatinine, Ser: 1.21 mg/dL (ref 0.61–1.24)
GFR, EST NON AFRICAN AMERICAN: 56 mL/min — AB (ref >60)
Glucose, Bld: 181 mg/dL — ABNORMAL HIGH (ref 65–99)
POTASSIUM: 3.6 mmol/L (ref 3.5–5.1)
Sodium: 136 mmol/L (ref 135–145)
TOTAL PROTEIN: 8.2 g/dL — AB (ref 6.5–8.1)

## 2016-10-25 LAB — BRAIN NATRIURETIC PEPTIDE: B NATRIURETIC PEPTIDE 5: 51.7 pg/mL (ref 0.0–100.0)

## 2016-10-25 LAB — I-STAT CG4 LACTIC ACID, ED
LACTIC ACID, VENOUS: 2.14 mmol/L — AB (ref 0.5–1.9)
Lactic Acid, Venous: 0.85 mmol/L (ref 0.5–1.9)

## 2016-10-25 LAB — URINALYSIS, MICROSCOPIC (REFLEX): SQUAMOUS EPITHELIAL / LPF: NONE SEEN

## 2016-10-25 LAB — PROTIME-INR
INR: 0.99
PROTHROMBIN TIME: 13.1 s (ref 11.4–15.2)

## 2016-10-25 MED ORDER — DIPHENHYDRAMINE HCL 50 MG/ML IJ SOLN: 50.0000 mg | Freq: Once | INTRAMUSCULAR | Status: DC

## 2016-10-25 MED ORDER — SODIUM CHLORIDE 0.9 % IV BOLUS (SEPSIS)
2000.0000 mL | Freq: Once | INTRAVENOUS | Status: AC
  Administered 2016-10-25: 2000 mL via INTRAVENOUS

## 2016-10-25 MED ORDER — DIPHENHYDRAMINE HCL 50 MG PO CAPS: 50.0000 mg | ORAL_CAPSULE | Freq: Once | ORAL | Status: DC

## 2016-10-25 MED ORDER — IBUPROFEN 400 MG PO TABS
400.0000 mg | ORAL_TABLET | Freq: Once | ORAL | Status: AC
  Administered 2016-10-25: 400 mg via ORAL
  Filled 2016-10-25: qty 1

## 2016-10-25 MED ORDER — VANCOMYCIN HCL IN DEXTROSE 1-5 GM/200ML-% IV SOLN
1000.0000 mg | INTRAVENOUS | Status: AC
  Administered 2016-10-25: 1000 mg via INTRAVENOUS
  Filled 2016-10-25: qty 200

## 2016-10-25 MED ORDER — PIPERACILLIN-TAZOBACTAM 3.375 G IVPB 30 MIN
3.3750 g | Freq: Once | INTRAVENOUS | Status: AC
  Administered 2016-10-25: 3.375 g via INTRAVENOUS
  Filled 2016-10-25 (×2): qty 50

## 2016-10-25 MED ORDER — VANCOMYCIN HCL IN DEXTROSE 750-5 MG/150ML-% IV SOLN
750.0000 mg | Freq: Two times a day (BID) | INTRAVENOUS | Status: DC
  Filled 2016-10-25: qty 150

## 2016-10-25 MED ORDER — HYDROCORTISONE NA SUCCINATE PF 250 MG IJ SOLR
200.0000 mg | Freq: Once | INTRAMUSCULAR | Status: DC
  Filled 2016-10-25: qty 200

## 2016-10-25 NOTE — ED Notes (Signed)
Patient has a urostomy, unable to collect urine in the waiting room.

## 2016-10-25 NOTE — ED Notes (Signed)
Pt oxygen saturation dropped to 89% on RA. Pt placed on 2L O2 via [place], with RT at bedside. Pt lung fields clear with diminished BIL bases.

## 2016-10-25 NOTE — ED Notes (Signed)
Solu-Cortef not available at current facility. EDP notified. Pt can receive med following transfer per EDP.

## 2016-10-25 NOTE — ED Notes (Signed)
Patient took 2 tylenol prior to arrival. Adrian Rios if it was regular strength or extra strength

## 2016-10-25 NOTE — Progress Notes (Signed)
ANTIBIOTIC CONSULT NOTE - INITIAL  Pharmacy Consult for Vancomycin Indication: sepsis  Allergies  Allergen Reactions  . Ivp Dye [Iodinated Diagnostic Agents] Shortness Of Breath, Itching and Palpitations    "eyes itching,  Heart racing,  Effected breathing"  . Shellfish Allergy Hives, Shortness Of Breath and Itching    Mostly crab  . Gabapentin Other (See Comments)    REACTION: dizziness and flushing  . Niacin-Lovastatin Er Other (See Comments)    Did not feel good on medication  . Adhesive [Tape] Rash    Use paper tape    Patient Measurements: Height: 5\' 7"  (170.2 cm) Weight: 162 lb (73.5 kg) IBW/kg (Calculated) : 66.1 Adjusted Body Weight:    Vital Signs: Temp: 101.3 F (38.5 C) (08/26 1947) Temp Source: Oral (08/26 1947) BP: 120/79 (08/26 2015) Pulse Rate: 98 (08/26 2015) Intake/Output from previous day: No intake/output data recorded. Intake/Output from this shift: No intake/output data recorded.  Labs:  Recent Labs  10/25/16 1937  WBC 16.2*  HGB 11.3*  PLT 496*  CREATININE 1.21   Estimated Creatinine Clearance: 48.6 mL/min (by C-G formula based on SCr of 1.21 mg/dL). No results for input(s): VANCOTROUGH, VANCOPEAK, VANCORANDOM, GENTTROUGH, GENTPEAK, GENTRANDOM, TOBRATROUGH, TOBRAPEAK, TOBRARND, AMIKACINPEAK, AMIKACINTROU, AMIKACIN in the last 72 hours.   Microbiology: No results found for this or any previous visit (from the past 720 hour(s)).  Medical History: Past Medical History:  Diagnosis Date  . Allergic rhinitis   . Arthritis   . Benign localized prostatic hyperplasia with lower urinary tract symptoms (LUTS)   . Bladder tumor   . Borderline glaucoma of right eye   . Cancer [facility])    bladder and prostate  . Carotid bruit    per duplex 03-11-2016 RICA 1-39%  . Chronic throat clearing   . DDD (degenerative disc disease), lumbar   . Depression   . ED (erectile dysfunction)   . Elevated PSA    urologist-  dr [name]--- s/p  prostate bx's   . GERD (gastroesophageal reflux disease)   . Hematuria   . History of chronic bronchitis   . 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 History of squamous cell carcinoma excision   . History of squamous cell carcinoma excision    several times  . Hyperlipidemia   . Pre-diabetes   . Premature ventricular contractions (PVCs) (VPCs)   . RAD (reactive airway disease)   . 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 Sensorineural hearing loss (SNHL) of both ears   . Wears glasses   . Wears hearing aid    bilateral   Assessment: CC/HPI: 76 y/o M with ureostomy and h/o frequent UTI's presents with fever and desats down 89% in ED. - Temp 101.3. WBC 16.2. LA 2.14, Scr 1.21 (elevated)  Goal of Therapy:  Vancomycin trough level 15-20 mcg/ml  Plan:  Vanco 1g IV x 1 at [facility] then 750mg  IV q12 hrs Vanco trough after 3-5 doses at steady state.   Dany Walther S. Alford Highland, PharmD, BCPS Clinical Staff Pharmacist Pager [phone]  Eilene Ghazi Stillinger 10/25/2016,8:30 PM

## 2016-10-25 NOTE — ED Triage Notes (Signed)
Patient states that he has a history of frequent UTIS - the patient reports htat he has had a fever today and he has a ureostomy and elevated HR

## 2016-10-25 NOTE — ED Notes (Signed)
Pt returned from CT °

## 2016-10-25 NOTE — ED Provider Notes (Signed)
[facility] Provider Note   CSN: [id number] Arrival date & time: 10/25/16  1907     History   Chief Complaint Chief Complaint  Patient presents with  . Fever    HPI JERREN FLINCHBAUGH is a 76 y.o. male.  76yo M w/ PMH including bladder cancer s/p urostomy, HLD, sarcoidosis who p/w fever. Pt has had intermittent fevers for a while since his surgery in April. He reports some intermittent back pain since having surgery. Currently pain is greater on R than L side. No change in color or quality of urine output. No abdominal pain, vomiting. He reports mild dry cough and SOB with exertion. No chest pain. No sick contacts or recent travel. No leg swelling/pain or h/o blood clots.    The history is provided by the patient.  Fever      Past Medical History:  Diagnosis Date  . Allergic rhinitis   . Arthritis   . Benign localized prostatic hyperplasia with lower urinary tract symptoms (LUTS)   . Bladder tumor   . Borderline glaucoma of right eye   . Cancer [facility])    bladder and prostate  . Carotid bruit    per duplex 03-11-2016 RICA 1-39%  . Chronic throat clearing   . DDD (degenerative disc disease), lumbar   . Depression   . ED (erectile dysfunction)   . Elevated PSA    urologist-  dr [name]--- s/p  prostate bx's  . GERD (gastroesophageal reflux disease)   . Hematuria   . History of chronic bronchitis   . 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 History of squamous cell carcinoma excision   . History of squamous cell carcinoma excision    several times  . Hyperlipidemia   . Pre-diabetes   . Premature ventricular contractions (PVCs) (VPCs)   . RAD (reactive airway disease)   . 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 Sensorineural hearing loss (SNHL) of both ears   . Wears glasses   . Wears hearing aid    bilateral    Patient Active Problem List   Diagnosis Date Noted  . Fatigue 08/19/2016  . Malnutrition of moderate degree 08/05/2016  . Sepsis ([place]) 08/04/2016  . History of bladder cancer 06/18/2016  . History of prostate cancer 06/18/2016  . 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 Bladder cancer ([place]) 06/11/2016  . Fever 05/16/2016  . Carotid bruit 02/17/2016  . Prediabetes 08/20/2015  . Overweight (BMI 25.0-29.9) 08/20/2015  . Esophageal reflux 08/20/2015  . Glaucoma 02/14/2015  . Varicose veins of lower extremities with other complications 23/55/7322  . RAD (reactive airway disease) 10/27/2011  . 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 Anemia, unspecified 10/27/2011  . 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 ERECTILE DYSFUNCTION 04/23/2010  . [place] DISEASE, LUMBOSACRAL SPINE 02/06/2010  . 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 Depression 04/12/2008  . Allergic rhinitis 04/12/2008  . ELEVATED PROSTATE SPECIFIC ANTIGEN 04/12/2008  . Hyperlipidemia 10/05/2006  . Sarcoidosis ([place]) 07/05/2006    Past Surgical History:  Procedure Laterality Date  . APPENDECTOMY  2008  . CARDIOVASCULAR STRESS TEST  05/27/2010   normal nuclear study w/ no ischemia/  normal LV function and wall motion , ef 60%  . CYSTOSCOPY WITH INJECTION N/A 06/12/2016   Procedure: CYSTOSCOPY WITH INJECTION OF INDOCYANINE GREEN DYE;  Surgeon:  Alexis Frock, MD;  Location: [facility];  Service: Urology;  Laterality: N/A;  . DEEP NECK LYMPH NODE BIOPSY / EXCISION  1970's   and Bronchoscopy w/ bx's ( dx Sarcoidosis)  . ILEOSTOMY    . MOHS SURGERY  2013 approx.    nasal; pre melanoma  . PARS PLANA VITRECTOMY Right 2007   repair macular pucker  . TONSILLECTOMY AND ADENOIDECTOMY  child  . TRANSURETHRAL RESECTION OF BLADDER TUMOR N/A 04/06/2016   Procedure: CYSTOSCOPY TRANSURETHRAL RESECTION OF BLADDER TUMORS (TURBT);  Surgeon: Carolan Clines, MD;  Location: [facility];  Service: Urology;  Laterality: N/A;  . TRANSURETHRAL  RESECTION OF PROSTATE         Home Medications    Prior to Admission medications   Medication Sig Start Date End Date Taking? Authorizing Provider  ADVAIR DISKUS 250-50 MCG/DOSE AEPB INHALE 1 DOSE BY MOUTH EVERY 12 HOURS 08/21/16   Burns, Claudina Lick, MD  albuterol (PROVENTIL HFA;VENTOLIN HFA) 108 (90 Base) MCG/ACT inhaler Inhale 2 puffs into the lungs every 6 (six) hours as needed for wheezing or shortness of breath. 08/27/15   [name], MD  Glucosamine-Chondroitin 750-600 MG TABS Take 1 tablet by mouth daily.    [provider]  magnesium oxide (MAG-OX) 400 MG tablet Take 400 mg by mouth daily.    [provider]  Misc Natural Products (GLUCOSAMINE CHOND DOUBLE STR PO) Take 1 tablet by mouth 2 (two) times daily.    [provider]  montelukast (SINGULAIR) 10 MG tablet TAKE ONE TABLET BY MOUTH ONCE DAILY 08/21/16   [name], MD  ranitidine (ZANTAC) 150 MG tablet TAKE ONE TABLET BY MOUTH AT BEDTIME 07/01/16   Burns, Claudina Lick, MD  triamcinolone (NASACORT ALLERGY 24HR) 55 MCG/ACT AERO nasal inhaler Place 1 spray into the nose at bedtime.    [provider]  venlafaxine (EFFEXOR) 75 MG tablet TAKE 1 TABLET BY MOUTH ONCE DAILY 09/28/16   [name], MD    Family History Family History  Problem Relation Age of Onset  . Stroke Mother        in her 73s  . Breast cancer Sister   . Heart disease Maternal Grandmother 84       MI   . Breast cancer Paternal Grandmother   . Heart disease Paternal Grandfather 86       MI  . Stroke Brother   . Diabetes Neg Hx     Social History Social History  Substance Use Topics  . Smoking status: Never Smoker  . Smokeless tobacco: Never Used  . Alcohol use No     Allergies   Ivp dye [iodinated diagnostic agents]; Shellfish allergy; Gabapentin; Niacin-lovastatin er; and Adhesive [tape]   Review of Systems Review of Systems  Constitutional: Positive for fever.   All other systems reviewed and are  negative except that which was mentioned in HPI   Physical Exam Updated Vital Signs BP 130/72   Pulse (!) 108   Temp (!) 101.3 F (38.5 C) (Oral) Comment: EDP notified of VS   Resp 20   Ht 5\' 7"  (1.702 m)   Wt 73.5 kg (162 lb)   SpO2 90%   BMI 25.37 kg/m   Physical Exam  Constitutional: He is oriented to person, place, and time. He appears well-developed and well-nourished. No distress.  Sitting up, comfortable  HENT:  Head: Normocephalic and atraumatic.  Moist mucous membranes  Eyes: Pupils are equal, round, and reactive to light.  Conjunctivae are normal.  Neck: Neck supple.  Cardiovascular: Normal rate, regular rhythm and normal heart sounds.   No murmur heard. Pulmonary/Chest: Effort normal and breath sounds normal.  Abdominal: Soft. Bowel sounds are normal. He exhibits no distension. There is no tenderness.  Urostomy in lower abdomen w/ yellow urine, no abdominal tenderness around bag  Musculoskeletal: He exhibits no edema.  Neurological: He is alert and oriented to person, place, and time.  Fluent speech  Skin: Skin is warm and dry. No rash noted.  Psychiatric: He has a normal mood and affect. Judgment normal.  Nursing note and vitals reviewed.    ED Treatments / Results  Labs (all labs ordered are listed, but only abnormal results are displayed) Labs Reviewed  CBC WITH DIFFERENTIAL/PLATELET - Abnormal; Notable for the following:       Result Value   WBC 16.2 (*)    RBC 4.12 (*)    Hemoglobin 11.3 (*)    HCT 35.0 (*)    Platelets 496 (*)    Neutro Abs 13.4 (*)    All other components within normal limits  I-STAT CG4 LACTIC ACID, ED - Abnormal; Notable for the following:    Lactic Acid, Venous 2.14 (*)    All other components within normal limits  CULTURE, BLOOD (ROUTINE X 2)  CULTURE, BLOOD (ROUTINE X 2)  PROTIME-INR  COMPREHENSIVE METABOLIC PANEL  URINALYSIS, ROUTINE W REFLEX MICROSCOPIC    EKG  EKG Interpretation None       Radiology Dg  Chest 2 View  Result Date: 10/25/2016 CLINICAL DATA:  Fevers EXAM: CHEST  2 VIEW COMPARISON:  09/08/2016 FINDINGS: The heart size and mediastinal contours are within normal limits. Both lungs are clear. The visualized skeletal structures are unremarkable. IMPRESSION: No active cardiopulmonary disease. Electronically Signed   By: Inez Catalina M.D.   On: 10/25/2016 20:45   Ct Chest Wo Contrast  Result Date: 10/25/2016 CLINICAL DATA:  Shortness of breath. Fever and urinary tract infection. History of sarcoidosis, bladder or prostate cancer. EXAM: CT CHEST WITHOUT CONTRAST TECHNIQUE: Multidetector CT imaging of the chest was performed following the standard protocol without IV contrast. COMPARISON:  Chest radiograph October 25, 2016 at 2018 hours FINDINGS: CARDIOVASCULAR: Heart size is normal. Mild coronary artery calcifications. No pericardial effusion. Thoracic aorta is normal course and caliber, mild calcific atherosclerosis. LEFT vertebral artery arises directly from the aortic arch, normal variant. MEDIASTINUM/NODES: No mediastinal mass. No lymphadenopathy by CT size criteria though sensitivity decreased without oral contrast. Normal appearance of thoracic esophagus though not tailored for evaluation. LUNGS/PLEURA: Tracheobronchial tree is patent, no pneumothorax. No pleural effusions, focal consolidations, pulmonary nodules or masses. Mildly thickened RIGHT minor fissure. Minimal bibasilar atelectasis/ scarring. UPPER ABDOMEN: Nonacute. MUSCULOSKELETAL: Included soft tissues and included osseous structures appear normal. IMPRESSION: Minimal bibasilar atelectasis/ scarring. Aortic Atherosclerosis (ICD10-I70.0). Electronically Signed   By: Elon Alas M.D.   On: 10/25/2016 21:43    Procedures .Critical Care Performed by: Sharlett Iles Authorized by: Sharlett Iles   Critical care provider statement:    Critical care time (minutes):  30   Critical care time was exclusive of:   Separately billable procedures and treating other patients   Critical care was necessary to treat or prevent imminent or life-threatening deterioration of the following conditions:  Sepsis   Critical care was time spent personally by me on the following activities:  Development of treatment plan with patient or surrogate, evaluation of patient's response to treatment, examination of patient, obtaining history  from patient or surrogate, ordering and performing treatments and interventions, ordering and review of laboratory studies, ordering and review of radiographic studies, re-evaluation of patient's condition and review of old charts   (including critical care time)  Medications Ordered in ED Medications  sodium chloride 0.9 % bolus 2,000 mL (not administered)  piperacillin-tazobactam (ZOSYN) IVPB 3.375 g (not administered)     Initial Impression / Assessment and Plan / ED Course  I have reviewed the triage vital signs and the nursing notes.  Pertinent labs & imaging results that were available during my care of the patient were reviewed by me and considered in my medical decision making (see chart for details).    PT w/ urostomy from bladder CA p/w fever today. On arrival he was febrile but BP and HR stable. He was mildly hypoxic and required 2L [place]. No crackles or respiratory distress on exam. Activated a code sepsis based on abnormal VS, gave IVF bolus, Vanc and zosyn based on previous culture data from urine. CXR negative acute. Lactate 2.14, Cr 1.21, BUN 26, WBC 16.2. UA is chronically contaminated; nitrite positive and large leukocytes today however no change in urine quality and no abd pain making UTI/pyelonephritis not an obvious explanation for his fever. I am concerned about hypoxia, he has a contrast allergy therefore obtained non-contrasted CT chest to evaluate for infiltrate, which was negative. His cancer hx makes him at risk for PE, therefore have ordered steroids for contrast  pre-treatment protocol to allow for future CTA chest. I discussed admission with [facility] hospital[facility] at [facility], Dr. Blaine Hamper, and pt will be transferred for admission.   Final Clinical Impressions(s) / ED Diagnoses   Final diagnoses:  Sepsis, due to unspecified organism [facility])  Hypoxia    New Prescriptions New Prescriptions   No medications on file     Darnice Comrie, Wenda Overland, MD 10/26/16 0930

## 2016-10-26 ENCOUNTER — Encounter: Payer: Self-pay | Admitting: Internal Medicine

## 2016-10-26 DIAGNOSIS — K219 Gastro-esophageal reflux disease without esophagitis: Secondary | ICD-10-CM

## 2016-10-26 DIAGNOSIS — A419 Sepsis, unspecified organism: Principal | ICD-10-CM

## 2016-10-26 DIAGNOSIS — C679 Malignant neoplasm of bladder, unspecified: Secondary | ICD-10-CM

## 2016-10-26 DIAGNOSIS — N179 Acute kidney failure, unspecified: Secondary | ICD-10-CM

## 2016-10-26 DIAGNOSIS — N390 Urinary tract infection, site not specified: Secondary | ICD-10-CM

## 2016-10-26 DIAGNOSIS — D869 Sarcoidosis, unspecified: Secondary | ICD-10-CM

## 2016-10-26 LAB — BASIC METABOLIC PANEL
Anion gap: 5 (ref 5–15)
BUN: 20 mg/dL (ref 6–20)
CHLORIDE: 112 mmol/L — AB (ref 101–111)
CO2: 23 mmol/L (ref 22–32)
CREATININE: 1.05 mg/dL (ref 0.61–1.24)
Calcium: 8.2 mg/dL — ABNORMAL LOW (ref 8.9–10.3)
GFR calc Af Amer: >60 mL/min (ref >60)
GLUCOSE: 132 mg/dL — AB (ref 65–99)
POTASSIUM: 3.3 mmol/L — AB (ref 3.5–5.1)
Sodium: 140 mmol/L (ref 135–145)

## 2016-10-26 LAB — CBC
HEMATOCRIT: 30.7 % — AB (ref 39.0–52.0)
Hemoglobin: 9.7 g/dL — ABNORMAL LOW (ref 13.0–17.0)
MCH: 26.6 pg (ref 26.0–34.0)
MCHC: 31.6 g/dL (ref 30.0–36.0)
MCV: 84.1 fL (ref 78.0–100.0)
Platelets: 421 10*3/uL — ABNORMAL HIGH (ref 150–400)
RBC: 3.65 MIL/uL — ABNORMAL LOW (ref 4.22–5.81)
RDW: 14.5 % (ref 11.5–15.5)
WBC: 13.3 10*3/uL — ABNORMAL HIGH (ref 4.0–10.5)

## 2016-10-26 LAB — PROCALCITONIN: PROCALCITONIN: 0.39 ng/mL

## 2016-10-26 LAB — LACTIC ACID, PLASMA
LACTIC ACID, VENOUS: 1.1 mmol/L (ref 0.5–1.9)
Lactic Acid, Venous: 0.9 mmol/L (ref 0.5–1.9)

## 2016-10-26 MED ORDER — GLUCOSAMINE CHOND DOUBLE STR PO TABS: ORAL_TABLET | Freq: Two times a day (BID) | ORAL | Status: DC

## 2016-10-26 MED ORDER — MONTELUKAST SODIUM 10 MG PO TABS
10.0000 mg | ORAL_TABLET | Freq: Every day | ORAL | Status: DC
  Administered 2016-10-26 – 2016-10-28 (×3): 10 mg via ORAL
  Filled 2016-10-26 (×3): qty 1

## 2016-10-26 MED ORDER — VENLAFAXINE HCL 75 MG PO TABS
75.0000 mg | ORAL_TABLET | Freq: Every day | ORAL | Status: DC
  Administered 2016-10-26 – 2016-10-28 (×3): 75 mg via ORAL
  Filled 2016-10-26 (×4): qty 1

## 2016-10-26 MED ORDER — SODIUM CHLORIDE 0.9 % IV SOLN
INTRAVENOUS | Status: AC
  Administered 2016-10-26: 10:00:00 via INTRAVENOUS

## 2016-10-26 MED ORDER — ENSURE ENLIVE PO LIQD
237.0000 mL | Freq: Two times a day (BID) | ORAL | Status: DC
  Administered 2016-10-27 – 2016-10-28 (×3): 237 mL via ORAL

## 2016-10-26 MED ORDER — SODIUM CHLORIDE 0.9 % IV SOLN
1.0000 g | Freq: Three times a day (TID) | INTRAVENOUS | Status: DC
  Administered 2016-10-26 – 2016-10-28 (×6): 1 g via INTRAVENOUS
  Filled 2016-10-26 (×8): qty 1

## 2016-10-26 MED ORDER — SODIUM CHLORIDE 0.9 % IV SOLN
INTRAVENOUS | Status: DC
  Administered 2016-10-26: 03:00:00 via INTRAVENOUS

## 2016-10-26 MED ORDER — GLUCOSAMINE-CHONDROITIN 750-600 MG PO TABS: 1.0000 | ORAL_TABLET | Freq: Every day | ORAL | Status: DC

## 2016-10-26 MED ORDER — MEROPENEM 1 G IV SOLR
1.0000 g | Freq: Two times a day (BID) | INTRAVENOUS | Status: DC
  Administered 2016-10-26: 1 g via INTRAVENOUS
  Filled 2016-10-26: qty 1

## 2016-10-26 MED ORDER — DIPHENHYDRAMINE HCL 50 MG PO CAPS
50.0000 mg | ORAL_CAPSULE | Freq: Once | ORAL | Status: AC
  Administered 2016-10-27: 50 mg via ORAL
  Filled 2016-10-26: qty 1

## 2016-10-26 MED ORDER — ACETAMINOPHEN 325 MG PO TABS
650.0000 mg | ORAL_TABLET | Freq: Four times a day (QID) | ORAL | Status: DC | PRN
  Administered 2016-10-26: 650 mg via ORAL
  Filled 2016-10-26: qty 2

## 2016-10-26 MED ORDER — PREDNISONE 50 MG PO TABS
50.0000 mg | ORAL_TABLET | Freq: Four times a day (QID) | ORAL | Status: AC
  Administered 2016-10-26 – 2016-10-27 (×3): 50 mg via ORAL
  Filled 2016-10-26 (×4): qty 1

## 2016-10-26 MED ORDER — DM-GUAIFENESIN ER 30-600 MG PO TB12
1.0000 | ORAL_TABLET | Freq: Two times a day (BID) | ORAL | Status: DC
  Administered 2016-10-26 – 2016-10-28 (×5): 1 via ORAL
  Filled 2016-10-26 (×6): qty 1

## 2016-10-26 MED ORDER — ZOLPIDEM TARTRATE 5 MG PO TABS
5.0000 mg | ORAL_TABLET | Freq: Every evening | ORAL | Status: DC | PRN
Start: 2016-10-26 — End: 2016-10-28

## 2016-10-26 MED ORDER — ENOXAPARIN SODIUM 40 MG/0.4ML ~~LOC~~ SOLN
40.0000 mg | SUBCUTANEOUS | Status: DC
  Administered 2016-10-26 – 2016-10-28 (×3): 40 mg via SUBCUTANEOUS
  Filled 2016-10-26 (×3): qty 0.4

## 2016-10-26 MED ORDER — MAGNESIUM OXIDE 400 (241.3 MG) MG PO TABS
400.0000 mg | ORAL_TABLET | Freq: Every day | ORAL | Status: DC
  Administered 2016-10-26 – 2016-10-28 (×3): 400 mg via ORAL
  Filled 2016-10-26 (×3): qty 1

## 2016-10-26 MED ORDER — DIPHENHYDRAMINE HCL 50 MG/ML IJ SOLN: 50.0000 mg | Freq: Once | INTRAMUSCULAR | Status: AC

## 2016-10-26 MED ORDER — ONDANSETRON HCL 4 MG/2ML IJ SOLN: 4.0000 mg | Freq: Three times a day (TID) | INTRAMUSCULAR | Status: DC | PRN

## 2016-10-26 MED ORDER — ALBUTEROL SULFATE (2.5 MG/3ML) 0.083% IN NEBU: 2.5000 mg | INHALATION_SOLUTION | RESPIRATORY_TRACT | Status: DC | PRN

## 2016-10-26 MED ORDER — MOMETASONE FURO-FORMOTEROL FUM 200-5 MCG/ACT IN AERO
2.0000 | INHALATION_SPRAY | Freq: Two times a day (BID) | RESPIRATORY_TRACT | Status: DC
  Administered 2016-10-26 – 2016-10-28 (×5): 2 via RESPIRATORY_TRACT
  Filled 2016-10-26: qty 8.8

## 2016-10-26 MED ORDER — TRIAMCINOLONE ACETONIDE 55 MCG/ACT NA AERO
1.0000 | INHALATION_SPRAY | Freq: Every day | NASAL | Status: DC
  Administered 2016-10-26 – 2016-10-27 (×2): 1 via NASAL
  Filled 2016-10-26: qty 10.8

## 2016-10-26 MED ORDER — FAMOTIDINE 20 MG PO TABS
20.0000 mg | ORAL_TABLET | Freq: Every day | ORAL | Status: DC
  Administered 2016-10-26 – 2016-10-27 (×2): 20 mg via ORAL
  Filled 2016-10-26 (×2): qty 1

## 2016-10-26 NOTE — Consult Note (Signed)
Reason for Consult: Bladder Cancer, Recurrent Pyelonephritis  Referring Physician: Domenic Polite MD  Adrian Rios is an 76 y.o. male.   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 Margins negative. Urethra remains in situ.   Post-op Surveillance:  - 09/2016 CT - no recurrence.   2 - Bilateral NON-Complex Renal Cysts - left peripelvic cysts w/o hydro or mass effect and right <1cm cortical cysts x 2 Rios hematuria CT 04/2016. No mass effect / enhancing nodules / or coarse calcifications.   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   5- Pyelonephritis - few episodes febrile UTI following cystectomy 05/2016. All coliforms with variable resistance, most recent e. Coli res to all but zosyn, cipro, merrem, nitro. CT 09/2016 w/o signifcant obstruction / stones / abscesses.   Presently Rios Merrem with [place] 8/26 (non-clonal) and [place] 8/26 NGTD /  pending.   PMH sig for appy, TNA. NO ischemic CV disease. His PCP is Celso Amy MD.   Today " Adrian Rios " is seen in consultation for above, specifically for opinion Rios recurrent febrile UTI.     Past Medical History:  Diagnosis Date  . Allergic rhinitis   . Arthritis   . Benign localized prostatic hyperplasia with lower urinary tract symptoms (LUTS)   . Bladder tumor   . Borderline glaucoma of right eye   . Cancer [facility])    bladder and prostate  . Carotid bruit    per duplex 03-11-2016 RICA 1-39%  . Chronic throat clearing   . DDD (degenerative disc disease), lumbar   . Depression   . ED (erectile dysfunction)   . Elevated PSA    urologist-  dr  [name]--- s/p  prostate bx's  . GERD (gastroesophageal reflux disease)   . Hematuria   . History of chronic bronchitis   . 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 History of squamous cell carcinoma excision   . History of squamous cell carcinoma excision    several times  . Hyperlipidemia   . Pre-diabetes   . Premature ventricular contractions (PVCs) (VPCs)   . RAD (reactive airway disease)   . 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 Sensorineural hearing loss (SNHL) of both ears   . Wears glasses   . Wears hearing aid    bilateral    Past Surgical History:  Procedure Laterality Date  . APPENDECTOMY  2008  . CARDIOVASCULAR STRESS TEST  05/27/2010   normal nuclear study w/ no ischemia/  normal LV function and wall motion , ef 60%  . CYSTOSCOPY WITH INJECTION N/A 06/12/2016   Procedure: CYSTOSCOPY WITH INJECTION OF INDOCYANINE GREEN DYE;  Surgeon: Alexis Frock, MD;  Location: [facility];  Service: Urology;  Laterality: N/A;  . DEEP NECK LYMPH NODE BIOPSY / EXCISION  1970's   and Bronchoscopy w/ bx's ( dx Sarcoidosis)  . ILEOSTOMY    . MOHS SURGERY  2013 approx.    nasal; pre melanoma  . PARS PLANA VITRECTOMY Right 2007   repair  macular pucker  . TONSILLECTOMY AND ADENOIDECTOMY  child  . TRANSURETHRAL RESECTION OF BLADDER TUMOR N/A 04/06/2016   Procedure: CYSTOSCOPY TRANSURETHRAL RESECTION OF BLADDER TUMORS (TURBT);  Surgeon: Carolan Clines, MD;  Location: [facility];  Service: Urology;  Laterality: N/A;  . TRANSURETHRAL RESECTION OF PROSTATE      Family History  Problem Relation Age of Onset  . Stroke Mother        in her 2s  . Breast cancer Sister   . Heart disease Maternal Grandmother 84       MI   . Breast cancer Paternal Grandmother   . Heart disease Paternal Grandfather 46       MI  . Stroke Brother   . Diabetes Neg Hx     Social  History:  reports that he has never smoked. He has never used smokeless tobacco. He reports that he does not drink alcohol or use drugs.  Allergies:  Allergies  Allergen Reactions  . Ivp Dye [Iodinated Diagnostic Agents] Shortness Of Breath, Itching and Palpitations    "eyes itching,  Heart racing,  Effected breathing"  . Shellfish Allergy Hives, Shortness Of Breath and Itching    Mostly crab  . Gabapentin Other (See Comments)    REACTION: dizziness and flushing  . Niacin-Lovastatin Er Other (See Comments)    Did not feel good Rios medication  . Adhesive [Tape] Rash    Use paper tape    Medications: I have reviewed the patient's current medications.    Review of Systems  Constitutional: Positive for fever and malaise/fatigue.  HENT: Negative.   Eyes: Negative.   Respiratory: Negative.   Cardiovascular: Negative.   Gastrointestinal: Negative.   Genitourinary: Positive for flank pain. Negative for hematuria.  Skin: Negative.   Neurological: Negative.   Endo/Heme/Allergies: Negative.   Psychiatric/Behavioral: Negative.    Blood pressure 107/65, pulse 66, temperature 98.4 F (36.9 C), temperature source Oral, resp. rate 17, height 5\' 7"  (1.702 m), weight 73.5 kg (162 lb 0.6 oz), SpO2 97 %. Physical Exam  Constitutional: He appears well-developed.  HENT:  Head: Normocephalic.  Eyes: Pupils are equal, round, and reactive to light.  Neck: Normal range of motion.  Cardiovascular: Normal rate.   Respiratory: Effort normal.  GI: Soft.  Genitourinary:  Genitourinary Comments: RLQ Urostomy pink / patent of yellow urine with scant mucus that is non-foul. Mild bilateral CVAT.  Musculoskeletal: Normal range of motion.  Neurological: He is alert.  Skin: Skin is warm.  Psychiatric: He has a normal mood and affect.    Assessment/Plan:   1 - Bladder Cancer - no evidence of recurrent disease by imaging this admission, He will be receiving repeat surveillance eval in about 81mos.    2 - Bilateral NON-Complex Renal Cysts - stable, No indication for furhter evaluation or treatment.   3- Moderate Risk Prostate Cancer - PSA at next cancer surveillance visit in about 2 mos  4 - Urinary Diversion - working well with normal GFR. He is facile with urostomy changes.   5- Pyelonephritis - Recurrence pattern concerning. Fortunately imaging w/o stones, abscesses, acute obstruction or other surgically correctable factors. Agree with current ABX with bridge to PO Cipro when afebrile x 24 hrs based Rios most recent clonal CX's with plan for 14 days total treatment.  Also rec begin Methenamine Hippurate 1000mg  PO BID now and at discharge to hopefully reduce recurrence risk going forward. I have HY'Q this for him and explained to pt rationale behind this therapy.  Will follow, please call me directly with questions anytime.     Alaysiah Browder 10/26/2016, 3:15 PM

## 2016-10-26 NOTE — Progress Notes (Signed)
PHARMACIST - PHYSICIAN ORDER COMMUNICATION  CONCERNING: P&T Medication Policy on Herbal Medications  DESCRIPTION:  This patient's order for:  Glucosamine and Chondroitin  has been noted.  This product(s) is classified as an "herbal" or natural product. Due to a lack of definitive safety studies or FDA approval, nonstandard manufacturing practices, plus the potential risk of unknown drug-drug interactions while on inpatient medications, the Pharmacy and Therapeutics Committee does not permit the use of "herbal" or natural products of this type within [facility].   ACTION TAKEN: The pharmacy department is unable to verify this order at this time and your patient has been informed of this safety policy. Please reevaluate patient's clinical condition at discharge and address if the herbal or natural product(s) should be resumed at that time.   Thanks Dorrene German 10/26/2016 3:27 AM

## 2016-10-26 NOTE — Progress Notes (Signed)
This is a no charge note  Transfer from [facility] per Dr. Rex Kras  76 year old man with past medical history of hyperlipidemia, prediabetes, GERD, depression, hearing loss, PVC, prostate cancer, bladder cancer (s/p of ureterostomy), who presents with fever and chills. Denies symptoms of UTI, but was found to have positive urinalysis with large amount of leukocytes and positive nitrate. Patient has history of ESBL. Patient was started with vancomycin and Zosyn in ED. I switched Abx to meropenem. Pt also has mild cough and shortness of breath on exertion. Oxygen 90% on exertion. Chest x-ray negative. CT of chest showed showed scarring change. Patient is allergic to iodine. ED physician gave one dose of Solu Cortef 200 mg in case patient needs CT angiogram to r/o PE. Pt is admitted to telemetry bed as inpatient.  WBC 16.2, lactic acid of 2.41, INR 0.99, BNP 59.7, creatinine 1.21, temperature 101.3, tachycardia, tachypnea. Patient is admitted to telemetry bed as inpatient.  Please call manager of [facility] hospitalists at [phone] when pt arrives to floor   Ivor Costa, MD  [facility] Pager [phone]  If 7PM-7AM, please contact night-coverage [email or web address] Password [email or web address] 10/26/2016, 1:10 AM

## 2016-10-26 NOTE — Progress Notes (Signed)
Pharmacy Antibiotic Note  Adrian Rios is a 76 y.o. male admitted on 10/25/2016 with sepsis secondary to complicated UTI (history of ESBL). Pharmacy has been consulted for Meropenem dosing.  Plan: Adjust Meropenem to 1g IV q8h for improved renal function.  Monitor renal function, cultures, clinical course.   Height: 5\' 7"  (170.2 cm) Weight: 162 lb 0.6 oz (73.5 kg) IBW/kg (Calculated) : 66.1  Temp (24hrs), Avg:99.5 F (37.5 C), Min:98.4 F (36.9 C), Max:101.3 F (38.5 C)   Recent Labs Lab 10/25/16 1937 10/25/16 1956 10/25/16 2248 10/26/16 0412 10/26/16 0620  WBC 16.2*  --   --  13.3*  --   CREATININE 1.21  --   --  1.05  --   LATICACIDVEN  --  2.14* 0.85 1.1 0.9    Estimated Creatinine Clearance: 56 mL/min (by C-G formula based on SCr of 1.05 mg/dL).    Allergies  Allergen Reactions  . Ivp Dye [Iodinated Diagnostic Agents] Shortness Of Breath, Itching and Palpitations    "eyes itching,  Heart racing,  Effected breathing"  . Shellfish Allergy Hives, Shortness Of Breath and Itching    Mostly crab  . Gabapentin Other (See Comments)    REACTION: dizziness and flushing  . Niacin-Lovastatin Er Other (See Comments)    Did not feel good on medication  . Adhesive [Tape] Rash    Use paper tape    Antimicrobials this admission: 8/26 >> Vancomycin x 1 8/26 >> Zosyn x 1 8/26 >> Meropenem >>  Dose adjustments this admission: 8/27: Meropenem adjusted from q12h to q8h for improved renal function  Microbiology results: 8/26 BCx: sent 8/26 UCx: sent    Thank you for allowing pharmacy to be a part of this patient's care.   Lindell Spar, PharmD, BCPS Pager: [phone] 10/26/2016 11:56 AM

## 2016-10-26 NOTE — Progress Notes (Signed)
Initial Nutrition Assessment  INTERVENTION:   -Encouraged small, frequent meals -Emphasized importance of protein intake -RD will continue to monitor for needs  NUTRITION DIAGNOSIS:   Increased nutrient needs related to cancer and cancer related treatments as evidenced by estimated needs.  GOAL:   Patient will meet greater than or equal to 90% of their needs  MONITOR:   PO intake, Labs, Weight trends, I & O's  REASON FOR ASSESSMENT:   Malnutrition Screening Tool    ASSESSMENT:   76 y.o. male with medical history significant of hyperlipidemia, prediabetes, GERD, depression, hearing loss, PVC, sarcoidosis, prostate cancer, bilateral cancer (urostomy), who presents with fever and chills.  Patient in room with family at bedside. Pt states he has felt bad since his bladder surgery in February 2018. Pt states that he has been eating well today. Had a [place] sandwich early this morning for "breakfast" and then he just had a [employer] flatbread. State his appetite is much better than it has been. However, pt states he has been eating 2.5 meals a day. States his doctor told him eat more protein and he has been trying to do so without supplements. States he does not like Ensure/Boost drinks.   Per chart review, pt has lost 8 lb since February 2018 (5% wt loss x >6 months, insignificant for time frame). Nutrition-Focused physical exam completed. Findings are no fat depletion, mild muscle depletion, and no edema.   Medications: MAG-OX tablet daily Labs reviewed: Low K  Diet Order:  Diet Heart Room service appropriate? Yes; Fluid consistency: Thin  Skin:  Reviewed, no issues  Last BM:  8/26  Height:   Ht Readings from Last 1 Encounters:  10/26/16 5\' 7"  (1.702 m)    Weight:   Wt Readings from Last 1 Encounters:  10/26/16 162 lb 0.6 oz (73.5 kg)    Ideal Body Weight:  66.8 kg  BMI:  Body mass index is 25.38 kg/m.  Estimated Nutritional Needs:   Kcal:  2000-2200  Protein:   80-90g  Fluid:  2L/day  EDUCATION NEEDS:   Education needs addressed  Clayton Bibles, MS, RD, LDN Pager: [phone] After Hours Pager: [phone]

## 2016-10-26 NOTE — Progress Notes (Addendum)
PROGRESS NOTE    AVANEESH PEPITONE  NTI:[id number] DOB: 12/24/1940 DOA: 10/25/2016 PCP: [name], MD  Brief Narrative:Adrian Rios is a 76 y.o. male with medical history significant of bladder cancer status post cystoprostatectomy and ileal conduit in April 2018, ureteral stents, and these were removed in May 2018, frequent urinary infections, hyperlipidemia, prediabetes, GERD, depression, hearing loss, PVC, sarcoidosis admitted with fever chills, disorientation. No other symptoms. In ED UA positive with large leukocytes, positive nitrite, WBC of 16 and mildly elevated lactate, temp of 101.3.  Assessment & Plan:   Sepsis secondary to urinary tract infection -In patient with complicated urological history status post cystoprostatectomy and ileal conduit -History of ESBL UTIs in the past -Continue IV meropenem and IV fluids today -Follow-up blood and urine culture -Check CT abdomen pelvis due to recurrent infections -Urology consult requested discussed with Dr. Tresa Moore    Sarcoidosis [facility]) -Chronic cough is unchanged,  -CT chest only remarkable for chronic findings  -Follow-up with pulmonary, stable    History of bladder cancer status post cystoprostatectomy and ileal conduit -Follow-up with urology  Mild AKI  -Hydrate, will continue IV fluids at a lower rate today  Dyspnea -ongoing for few months -check ECHO  DVT prophylaxis:Lovenox  Code Status: Full code  Family Communication:Daughter at bedside Disposition Plan: Home pending above workup and improvement  Consultants:  Urology   Antimicrobials:  Meropenem 8/27  Subjective: Feels a little better , fevers down, breathing better , historical had urinary symptoms ever since his urostomy   Objective: Vitals:   10/26/16 0200 10/26/16 0242 10/26/16 0838 10/26/16 1436  BP: 113/65 111/71  107/65  Pulse: 73 71  66  Resp: (!) 23 18  17   Temp:  98.4 F (36.9 C)  98.4 F (36.9 C)  TempSrc:  Oral  Oral  SpO2: 98% 95% 95%  97%  Weight:  73.5 kg (162 lb 0.6 oz)    Height:  5\' 7"  (1.702 m)      Intake/Output Summary (Last 24 hours) at 10/26/16 1507 Last data filed at 10/26/16 0848  Gross per 24 hour  Intake           738.33 ml  Output             1350 ml  Net          -611.67 ml   Filed Weights   10/25/16 1915 10/26/16 0242  Weight: 73.5 kg (162 lb) 73.5 kg (162 lb 0.6 oz)    Examination:  General exam: Appears calm and comfortable, No distress  Respiratory system:Clear to auscultation  Cardiovascular system: S1 & S2 heard, RRR.  Gastrointestinal system:Abdomen is soft, nontender, positive sounds present, urostomy with clear urine noted, mild tenderness in his right flank  Central nervous system: Alert and oriented. No focal neurological deficits. Extremities: Symmetric 5 x 5 power. Skin: No rashes, lesions or ulcers Psychiatry: Judgement and insight appear normal. Mood & affect appropriate.     Data Reviewed:   CBC:  Recent Labs Lab 10/25/16 1937 10/26/16 0412  WBC 16.2* 13.3*  NEUTROABS 13.4*  --   HGB 11.3* 9.7*  HCT 35.0* 30.7*  MCV 85.0 84.1  PLT 496* 867*   Basic Metabolic Panel:  Recent Labs Lab 10/25/16 1937 10/26/16 0412  NA 136 140  K 3.6 3.3*  CL 107 112*  CO2 21* 23  GLUCOSE 181* 132*  BUN 26* 20  CREATININE 1.21 1.05  CALCIUM 9.2 8.2*   GFR: Estimated Creatinine Clearance: 56  mL/min (by C-G formula based on SCr of 1.05 mg/dL). Liver Function Tests:  Recent Labs Lab 10/25/16 1937  AST 26  ALT 18  ALKPHOS 109  BILITOT 0.1*  PROT 8.2*  ALBUMIN 3.5   No results for input(s): LIPASE, AMYLASE in the last 168 hours. No results for input(s): AMMONIA in the last 168 hours. Coagulation Profile:  Recent Labs Lab 10/25/16 1937  INR 0.99   Cardiac Enzymes: No results for input(s): CKTOTAL, CKMB, CKMBINDEX, TROPONINI in the last 168 hours. BNP (last 3 results) No results for input(s): PROBNP in the last 8760 hours. HbA1C: No results for input(s):  HGBA1C in the last 72 hours. CBG: No results for input(s): GLUCAP in the last 168 hours. Lipid Profile: No results for input(s): CHOL, HDL, LDLCALC, TRIG, CHOLHDL, LDLDIRECT in the last 72 hours. Thyroid Function Tests: No results for input(s): TSH, T4TOTAL, FREET4, T3FREE, THYROIDAB in the last 72 hours. Anemia Panel: No results for input(s): VITAMINB12, FOLATE, FERRITIN, TIBC, IRON, RETICCTPCT in the last 72 hours. Urine analysis:    Component Value Date/Time   COLORURINE YELLOW 10/25/2016 1937   APPEARANCEUR CLOUDY (A) 10/25/2016 1937   LABSPEC 1.014 10/25/2016 1937   PHURINE 6.5 10/25/2016 1937   GLUCOSEU NEGATIVE 10/25/2016 1937   GLUCOSEU NEGATIVE 08/21/2016 1033   HGBUR SMALL (A) 10/25/2016 1937   HGBUR negative 07/11/2009 0813   BILIRUBINUR NEGATIVE 10/25/2016 1937   KETONESUR NEGATIVE 10/25/2016 1937   PROTEINUR NEGATIVE 10/25/2016 1937   UROBILINOGEN 0.2 08/21/2016 1033   NITRITE POSITIVE (A) 10/25/2016 1937   LEUKOCYTESUR LARGE (A) 10/25/2016 1937   Sepsis Labs: @LABRCNTIP (procalcitonin:4,lacticidven:4)  )No results found for this or any previous visit (from the past 240 hour(s)).       Radiology Studies: Dg Chest 2 View  Result Date: 10/25/2016 CLINICAL DATA:  Fevers EXAM: CHEST  2 VIEW COMPARISON:  09/08/2016 FINDINGS: The heart size and mediastinal contours are within normal limits. Both lungs are clear. The visualized skeletal structures are unremarkable. IMPRESSION: No active cardiopulmonary disease. Electronically Signed   By: Inez Catalina M.D.   On: 10/25/2016 20:45   Ct Chest Wo Contrast  Result Date: 10/25/2016 CLINICAL DATA:  Shortness of breath. Fever and urinary tract infection. History of sarcoidosis, bladder or prostate cancer. EXAM: CT CHEST WITHOUT CONTRAST TECHNIQUE: Multidetector CT imaging of the chest was performed following the standard protocol without IV contrast. COMPARISON:  Chest radiograph October 25, 2016 at 2018 hours FINDINGS:  CARDIOVASCULAR: Heart size is normal. Mild coronary artery calcifications. No pericardial effusion. Thoracic aorta is normal course and caliber, mild calcific atherosclerosis. LEFT vertebral artery arises directly from the aortic arch, normal variant. MEDIASTINUM/NODES: No mediastinal mass. No lymphadenopathy by CT size criteria though sensitivity decreased without oral contrast. Normal appearance of thoracic esophagus though not tailored for evaluation. LUNGS/PLEURA: Tracheobronchial tree is patent, no pneumothorax. No pleural effusions, focal consolidations, pulmonary nodules or masses. Mildly thickened RIGHT minor fissure. Minimal bibasilar atelectasis/ scarring. UPPER ABDOMEN: Nonacute. MUSCULOSKELETAL: Included soft tissues and included osseous structures appear normal. IMPRESSION: Minimal bibasilar atelectasis/ scarring. Aortic Atherosclerosis (ICD10-I70.0). Electronically Signed   By: Elon Alas M.D.   On: 10/25/2016 21:43        Scheduled Meds: . dextromethorphan-guaiFENesin  1 tablet Oral BID  . diphenhydrAMINE  50 mg Oral Once   Or  . diphenhydrAMINE  50 mg Intravenous Once  . enoxaparin (LOVENOX) injection  40 mg Subcutaneous Q24H  . famotidine  20 mg Oral QHS  . feeding supplement (ENSURE ENLIVE)  237 mL Oral BID BM  . magnesium oxide  400 mg Oral Daily  . mometasone-formoterol  2 puff Inhalation BID  . montelukast  10 mg Oral Daily  . predniSONE  50 mg Oral Q6H  . triamcinolone  1 spray Nasal QHS  . venlafaxine  75 mg Oral Daily   Continuous Infusions: . sodium chloride 50 mL/hr at 10/26/16 1000  . meropenem (MERREM) IV       LOS: 1 day    Time spent: 73min    Domenic Polite, MD [facility] Pager [phone]  If 7PM-7AM, please contact night-coverage [email or web address] Password [email or web address] 10/26/2016, 3:07 PM

## 2016-10-26 NOTE — Progress Notes (Signed)
ANTIBIOTIC CONSULT NOTE  Pharmacy Consult for meropenem  Indication: sepsis  Allergies  Allergen Reactions  . Ivp Dye [Iodinated Diagnostic Agents] Shortness Of Breath, Itching and Palpitations    "eyes itching,  Heart racing,  Effected breathing"  . Shellfish Allergy Hives, Shortness Of Breath and Itching    Mostly crab  . Gabapentin Other (See Comments)    REACTION: dizziness and flushing  . Niacin-Lovastatin Er Other (See Comments)    Did not feel good on medication  . Adhesive [Tape] Rash    Use paper tape    Patient Measurements: Height: 5\' 7"  (170.2 cm) Weight: 162 lb (73.5 kg) IBW/kg (Calculated) : 66.1   Vital Signs: Temp: 98.9 F (37.2 C) (08/26 2321) Temp Source: Oral (08/26 2321) BP: 118/76 (08/26 2230) Pulse Rate: 88 (08/26 2230) Intake/Output from previous day: 08/26 0701 - 08/27 0700 In: -  Out: 500 [Urine:500] Intake/Output from this shift: Total I/O In: -  Out: 500 [Urine:500]  Labs:  Recent Labs  10/25/16 1937  WBC 16.2*  HGB 11.3*  PLT 496*  CREATININE 1.21   Estimated Creatinine Clearance: 48.6 mL/min (by C-G formula based on SCr of 1.21 mg/dL). No results for input(s): VANCOTROUGH, VANCOPEAK, VANCORANDOM, GENTTROUGH, GENTPEAK, GENTRANDOM, TOBRATROUGH, TOBRAPEAK, TOBRARND, AMIKACINPEAK, AMIKACINTROU, AMIKACIN in the last 72 hours.   Microbiology: No results found for this or any previous visit (from the past 720 hour(s)).  Medical History: Past Medical History:  Diagnosis Date  . Allergic rhinitis   . Arthritis   . Benign localized prostatic hyperplasia with lower urinary tract symptoms (LUTS)   . Bladder tumor   . Borderline glaucoma of right eye   . Cancer [facility])    bladder and prostate  . Carotid bruit    per duplex 03-11-2016 RICA 1-39%  . Chronic throat clearing   . DDD (degenerative disc disease), lumbar   . Depression   . ED (erectile dysfunction)   . Elevated PSA    urologist-  dr [name]--- s/p  prostate bx's  .  GERD (gastroesophageal reflux disease)   . Hematuria   . History of chronic bronchitis   . 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 History of squamous cell carcinoma excision   . History of squamous cell carcinoma excision    several times  . Hyperlipidemia   . Pre-diabetes   . Premature ventricular contractions (PVCs) (VPCs)   . RAD (reactive airway disease)   . 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)    dx 1970's  s/p  deep neck lymph node bx's and lung bx's  . Sensorineural hearing loss (SNHL) of both ears   . Wears glasses   . Wears hearing aid    bilateral   Assessment: 76 y/o M with ureostomy and h/o frequent UTI's presents with fever. Tmax is 101.3, WBC 16.2, and LA trending down from  2.14 to 0.85.  SCr is 1.21 for estimated CrCl ~ 45-50 mL/min.   Patient previously on vancomycin, but switched to meropenem given history of ESBL UTI.   Abx: 8/26 Vanc x1 8/26 Zosyn x1 8/27 Meropenem >>   Plan:  D/C Vancomycin  Meropenem 1g IV q12hr Monitor renal function, clinical picture, and culture data F/u length of therapy and de-escalation   Argie Ramming, PharmD Clinical Pharmacist 10/26/16 1:20 AM

## 2016-10-26 NOTE — H&P (Signed)
History and Physical    Adrian Rios NTI:[id number] DOB: December 16, 1940 DOA: 10/25/2016  Referring MD/NP/PA:   PCP: [name], MD   Patient coming from:  The patient is coming from home.  At baseline, pt is independent for most of ADL.  Chief Complaint: Fever, chills  HPI: Adrian Rios is a 76 y.o. male with medical history significant of hyperlipidemia, prediabetes, GERD, depression, hearing loss, PVC, sarcoidosis, prostate cancer, bilateral cancer (urostomy), who presents with fever and chills.  Patient states that he has been having fever and chills since yesterday morning. He has temperature 101.5 at home. Patient is s/p of ureostomy, therefore he never had dysuria or burning on urination. He did not notice any urine color change or hematuria. Patient states that he has chronic mild dry cough and shortness of breath on exertion due to sarcoidosis, which has not changed. Patient does not have chest pain, tenderness in the calf areas. Patient denies nausea, vomiting, diarrhea, abdominal pain, symptoms of UTI.  ED Course: pt was found to have positive urinalysis with large amount of leukocyte and positive nitrite, WBC 16.2, lactic acid 2.41, INR 0.99, BNP 51.7, acute renal injury with creatinine 1.21, temperature 101.3, tachycardia, tachypnea, oxygen saturation 90-98% on room air. Chest x-ray negative. CT of chest showed scarring change without infiltration.  Review of Systems:   General: has fevers, chills, no body weight gain, has poor appetite, has fatigue HEENT: no blurry vision, hearing changes or sore throat Respiratory: has mild dyspnea, coughing, no wheezing CV: no chest pain, no palpitations GI: no nausea, vomiting, abdominal pain, diarrhea, constipation GU: no dysuria, burning on urination, increased urinary frequency, hematuria  Ext: no leg edema Neuro: no unilateral weakness, numbness, or tingling, no vision change or hearing loss Skin: no rash, no skin tear. MSK: No  muscle spasm, no deformity, no limitation of range of movement in spin Heme: No easy bruising.  Travel history: No recent long distant travel.  Allergy:  Allergies  Allergen Reactions  . Ivp Dye [Iodinated Diagnostic Agents] Shortness Of Breath, Itching and Palpitations    "eyes itching,  Heart racing,  Effected breathing"  . Shellfish Allergy Hives, Shortness Of Breath and Itching    Mostly crab  . Gabapentin Other (See Comments)    REACTION: dizziness and flushing  . Niacin-Lovastatin Er Other (See Comments)    Did not feel good on medication  . Adhesive [Tape] Rash    Use paper tape    Past Medical History:  Diagnosis Date  . Allergic rhinitis   . Arthritis   . Benign localized prostatic hyperplasia with lower urinary tract symptoms (LUTS)   . Bladder tumor   . Borderline glaucoma of right eye   . Cancer [facility])    bladder and prostate  . Carotid bruit    per duplex 03-11-2016 RICA 1-39%  . Chronic throat clearing   . DDD (degenerative disc disease), lumbar   . Depression   . ED (erectile dysfunction)   . Elevated PSA    urologist-  dr [name]--- s/p  prostate bx's  . GERD (gastroesophageal reflux disease)   . Hematuria   . History of chronic bronchitis   . 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 History of squamous cell carcinoma excision   . History of squamous cell carcinoma excision    several times  . Hyperlipidemia   . Pre-diabetes   . Premature ventricular contractions (PVCs) (VPCs)   .  RAD (reactive airway disease)   . 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)    dx 1970's  s/p  deep neck lymph node bx's and lung bx's  . Sensorineural hearing loss (SNHL) of both ears   . Wears glasses   . Wears hearing aid    bilateral    Past Surgical History:  Procedure Laterality Date  . APPENDECTOMY  2008  . CARDIOVASCULAR STRESS TEST  05/27/2010   normal nuclear study w/ no ischemia/  normal LV function and wall  motion , ef 60%  . CYSTOSCOPY WITH INJECTION N/A 06/12/2016   Procedure: CYSTOSCOPY WITH INJECTION OF INDOCYANINE GREEN DYE;  Surgeon: Alexis Frock, MD;  Location: [facility];  Service: Urology;  Laterality: N/A;  . DEEP NECK LYMPH NODE BIOPSY / EXCISION  1970's   and Bronchoscopy w/ bx's ( dx Sarcoidosis)  . ILEOSTOMY    . MOHS SURGERY  2013 approx.    nasal; pre melanoma  . PARS PLANA VITRECTOMY Right 2007   repair macular pucker  . TONSILLECTOMY AND ADENOIDECTOMY  child  . TRANSURETHRAL RESECTION OF BLADDER TUMOR N/A 04/06/2016   Procedure: CYSTOSCOPY TRANSURETHRAL RESECTION OF BLADDER TUMORS (TURBT);  Surgeon: Carolan Clines, MD;  Location[facility];  Service: Urology;  Laterality: N/A;  . TRANSURETHRAL RESECTION OF PROSTATE      Social History:  reports that he has never smoked. He has never used smokeless tobacco. He reports that he does not drink alcohol or use drugs.  Family History:  Family History  Problem Relation Age of Onset  . Stroke Mother        in her 62s  . Breast cancer Sister   . Heart disease Maternal Grandmother 84       MI   . Breast cancer Paternal Grandmother   . Heart disease Paternal Grandfather 103       MI  . Stroke Brother   . Diabetes Neg Hx      Prior to Admission medications   Medication Sig Start Date End Date Taking? Authorizing Provider  ADVAIR DISKUS 250-50 MCG/DOSE AEPB INHALE 1 DOSE BY MOUTH EVERY 12 HOURS 08/21/16   Burns, Claudina Lick, MD  albuterol (PROVENTIL HFA;VENTOLIN HFA) 108 (90 Base) MCG/ACT inhaler Inhale 2 puffs into the lungs every 6 (six) hours as needed for wheezing or shortness of breath. 08/27/15   [name], MD  Glucosamine-Chondroitin 750-600 MG TABS Take 1 tablet by mouth daily.    [provider]  magnesium oxide (MAG-OX) 400 MG tablet Take 400 mg by mouth daily.    [provider]  Misc Natural Products (GLUCOSAMINE CHOND DOUBLE STR PO) Take 1 tablet by mouth 2 (two) times daily.     [provider]  montelukast (SINGULAIR) 10 MG tablet TAKE ONE TABLET BY MOUTH ONCE DAILY 08/21/16   [name], MD  ranitidine (ZANTAC) 150 MG tablet TAKE ONE TABLET BY MOUTH AT BEDTIME 07/01/16   Burns, Claudina Lick, MD  triamcinolone (NASACORT ALLERGY 24HR) 55 MCG/ACT AERO nasal inhaler Place 1 spray into the nose at bedtime.    [provider]  venlafaxine (EFFEXOR) 75 MG tablet TAKE 1 TABLET BY MOUTH ONCE DAILY 09/28/16   [name], MD    Physical Exam: Vitals:   10/26/16 0130 10/26/16 0151 10/26/16 0200 10/26/16 0242  BP: 115/70  113/65 111/71  Pulse: 73  73 71  Resp: 18  (!) 23 18  Temp:  98.4 F (36.9 C)  98.4 F (36.9 C)  TempSrc:  Oral  Oral  SpO2: 98%  98% 95%  Weight:    73.5 kg (162 lb 0.6 oz)  Height:    5\' 7"  (1.702 m)   General: Not in acute distress HEENT:       Eyes: PERRL, EOMI, no scleral icterus.       ENT: No discharge from the ears and nose, no pharynx injection, no tonsillar enlargement.        Neck: No JVD, no bruit, no mass felt. Heme: No neck lymph node enlargement. Cardiac: S1/S2, RRR, No murmurs, No gallops or rubs. Respiratory: No rales, wheezing, rhonchi or rubs. GI: Soft, nondistended, nontender, no rebound pain, no organomegaly, BS present. S/p of ureostomy with clean surroundings. GU: No hematuria Ext: No pitting leg edema bilaterally. 2+DP/PT pulse bilaterally. Musculoskeletal: No joint deformities, No joint redness or warmth, no limitation of ROM in spin. Skin: No rashes.  Neuro: Alert, oriented X3, cranial nerves II-XII grossly intact, moves all extremities normally.  Psych: Patient is not psychotic, no suicidal or hemocidal ideation.  Labs on Admission: I have personally reviewed following labs and imaging studies  CBC:  Recent Labs Lab 10/25/16 1937  WBC 16.2*  NEUTROABS 13.4*  HGB 11.3*  HCT 35.0*  MCV 85.0  PLT 371*   Basic Metabolic Panel:  Recent Labs Lab 10/25/16 1937  NA 136  K 3.6  CL 107  CO2  21*  GLUCOSE 181*  BUN 26*  CREATININE 1.21  CALCIUM 9.2   GFR: Estimated Creatinine Clearance: 48.6 mL/min (by C-G formula based on SCr of 1.21 mg/dL). Liver Function Tests:  Recent Labs Lab 10/25/16 1937  AST 26  ALT 18  ALKPHOS 109  BILITOT 0.1*  PROT 8.2*  ALBUMIN 3.5   No results for input(s): LIPASE, AMYLASE in the last 168 hours. No results for input(s): AMMONIA in the last 168 hours. Coagulation Profile:  Recent Labs Lab 10/25/16 1937  INR 0.99   Cardiac Enzymes: No results for input(s): CKTOTAL, CKMB, CKMBINDEX, TROPONINI in the last 168 hours. BNP (last 3 results) No results for input(s): PROBNP in the last 8760 hours. HbA1C: No results for input(s): HGBA1C in the last 72 hours. CBG: No results for input(s): GLUCAP in the last 168 hours. Lipid Profile: No results for input(s): CHOL, HDL, LDLCALC, TRIG, CHOLHDL, LDLDIRECT in the last 72 hours. Thyroid Function Tests: No results for input(s): TSH, T4TOTAL, FREET4, T3FREE, THYROIDAB in the last 72 hours. Anemia Panel: No results for input(s): VITAMINB12, FOLATE, FERRITIN, TIBC, IRON, RETICCTPCT in the last 72 hours. Urine analysis:    Component Value Date/Time   COLORURINE YELLOW 10/25/2016 1937   APPEARANCEUR CLOUDY (A) 10/25/2016 1937   LABSPEC 1.014 10/25/2016 1937   PHURINE 6.5 10/25/2016 1937   GLUCOSEU NEGATIVE 10/25/2016 1937   GLUCOSEU NEGATIVE 08/21/2016 1033   HGBUR SMALL (A) 10/25/2016 1937   HGBUR negative 07/11/2009 0813   BILIRUBINUR NEGATIVE 10/25/2016 1937   KETONESUR NEGATIVE 10/25/2016 1937   PROTEINUR NEGATIVE 10/25/2016 1937   UROBILINOGEN 0.2 08/21/2016 1033   NITRITE POSITIVE (A) 10/25/2016 1937   LEUKOCYTESUR LARGE (A) 10/25/2016 1937   Sepsis Labs: @LABRCNTIP (procalcitonin:4,lacticidven:4) )No results found for this or any previous visit (from the past 240 hour(s)).   Radiological Exams on Admission: Dg Chest 2 View  Result Date: 10/25/2016 CLINICAL DATA:  Fevers EXAM:  CHEST  2 VIEW COMPARISON:  09/08/2016 FINDINGS: The heart size and mediastinal contours are within normal limits. Both lungs are clear. The  visualized skeletal structures are unremarkable. IMPRESSION: No active cardiopulmonary disease. Electronically Signed   By: Inez Catalina M.D.   On: 10/25/2016 20:45   Ct Chest Wo Contrast  Result Date: 10/25/2016 CLINICAL DATA:  Shortness of breath. Fever and urinary tract infection. History of sarcoidosis, bladder or prostate cancer. EXAM: CT CHEST WITHOUT CONTRAST TECHNIQUE: Multidetector CT imaging of the chest was performed following the standard protocol without IV contrast. COMPARISON:  Chest radiograph October 25, 2016 at 2018 hours FINDINGS: CARDIOVASCULAR: Heart size is normal. Mild coronary artery calcifications. No pericardial effusion. Thoracic aorta is normal course and caliber, mild calcific atherosclerosis. LEFT vertebral artery arises directly from the aortic arch, normal variant. MEDIASTINUM/NODES: No mediastinal mass. No lymphadenopathy by CT size criteria though sensitivity decreased without oral contrast. Normal appearance of thoracic esophagus though not tailored for evaluation. LUNGS/PLEURA: Tracheobronchial tree is patent, no pneumothorax. No pleural effusions, focal consolidations, pulmonary nodules or masses. Mildly thickened RIGHT minor fissure. Minimal bibasilar atelectasis/ scarring. UPPER ABDOMEN: Nonacute. MUSCULOSKELETAL: Included soft tissues and included osseous structures appear normal. IMPRESSION: Minimal bibasilar atelectasis/ scarring. Aortic Atherosclerosis (ICD10-I70.0). Electronically Signed   By: Elon Alas M.D.   On: 10/25/2016 21:43     EKG: Independently reviewed.  Sinus rhythm, QTC 419, no ischemic change.  Assessment/Plan Principal Problem:   Complicated UTI (urinary tract infection) Active Problems:   Sarcoidosis (HCC)   Esophageal reflux   Bladder cancer (HCC)   Sepsis ([place])   AKI (acute kidney injury)  ([place])   Sepsis due to complicated UTI: pt fever and chills are likely due to complicated UTI. He has positive UA. Has hx of ESBL. Patient meets criteria for sepsis with leukocytosis, tachycardia, tachypnea. Lactic acid is elevated. Hemodynamically stable.   - Admit to telemetry bed as inpt -  IV meropenem (patient was started with vancomycin and Zosyn initially) - Follow up results of urine and blood cx and amend antibiotic regimen if needed per sensitivity results - prn Zofran for nausea - will get Procalcitonin and trend lactic acid levels per sepsis protocol. - IVF: 2L of NS bolus in ED, followed by 100 cc/h   Sarcoidosis [facility]): Patient states that he has chronic mild cough and shortness of breath on exertion, which have not changed recently. Patient was found to have O2 desaturation on exertion in ED, was concerning for PE. Pt was given one  Dose of Solu Cortef for preparing pt for possible CT angiogram.  On my evaluation, patient does not have chest pain, no signs of DVT. He is breathing comfortably in bed. Clinically does not seem to have PE. His oxygen desaturation is most likely due to history of sarcoidosis. Lung auscultation is clear. Calcium 9.2. No sign of exacerbation Sarcoidosis.  - when necessary albuterol nebulizers -Dulera inhaler -continue home Singulair -When necessary Mucinex for cough  GERD: -Protonix  Bladder cancer [facility]): s/p of surgery and urostomy. No radiation and chemotherapy. -f/u with his Urologist.  Mild AKI (acute kidney injury): cre 1.21, BUN 26. Likely due to UTI -f/u renal  Function by BMP - treat UTI as above  DVT ppx: SQ Lovenox Code Status: Full code Family Communication: None at bed side.   Disposition Plan:  Anticipate discharge back to previous home environment Consults called:  none Admission status:   Inpatient/tele     Date of Service 10/26/2016    Ivor Costa [facility] Pager [phone]  If 7PM-7AM, please contact  night-coverage [email or web address] Password [facility] 10/26/2016, 4:37 AM

## 2016-10-27 ENCOUNTER — Inpatient Hospital Stay (HOSPITAL_COMMUNITY): Payer: Medicare Other

## 2016-10-27 DIAGNOSIS — I5030 Unspecified diastolic (congestive) heart failure: Secondary | ICD-10-CM

## 2016-10-27 LAB — URINE CULTURE

## 2016-10-27 LAB — ECHOCARDIOGRAM COMPLETE
HEIGHTINCHES: 67 in
Weight: 2592.61 oz

## 2016-10-27 MED ORDER — IOPAMIDOL (ISOVUE-300) INJECTION 61%
100.0000 mL | Freq: Once | INTRAVENOUS | Status: AC | PRN
  Administered 2016-10-27: 100 mL via INTRAVENOUS

## 2016-10-27 MED ORDER — METHENAMINE MANDELATE 1 G PO TABS
1000.0000 mg | ORAL_TABLET | Freq: Two times a day (BID) | ORAL | Status: DC
  Filled 2016-10-27: qty 1

## 2016-10-27 MED ORDER — POTASSIUM CHLORIDE CRYS ER 20 MEQ PO TBCR
40.0000 meq | EXTENDED_RELEASE_TABLET | Freq: Once | ORAL | Status: AC
Start: 2016-10-27 — End: 2016-10-27
  Administered 2016-10-27: 40 meq via ORAL
  Filled 2016-10-27: qty 2

## 2016-10-27 MED ORDER — SODIUM CHLORIDE 0.9 % IV SOLN
INTRAVENOUS | Status: AC
  Filled 2016-10-27: qty 250

## 2016-10-27 MED ORDER — METHENAMINE MANDELATE 0.5 G PO TABS
1000.0000 mg | ORAL_TABLET | Freq: Two times a day (BID) | ORAL | Status: DC
  Administered 2016-10-27 – 2016-10-28 (×2): 1000 mg via ORAL
  Filled 2016-10-27 (×2): qty 2

## 2016-10-27 MED ORDER — IOPAMIDOL (ISOVUE-300) INJECTION 61%
INTRAVENOUS | Status: AC
  Filled 2016-10-27: qty 100

## 2016-10-27 NOTE — Care Management Note (Signed)
Case Management Note  Patient Details  Name: Adrian Rios MRN: [id number] Date of Birth: Jun 29, 1940  Subjective/Objective:   Bladder Cancer, Recurrent Pyelonephritis                 Action/Plan: following for [facility] needs   Expected Discharge Date:                  Expected Discharge Plan:  Home/Self Care  In-House Referral:     Discharge planning Services  CM Consult  Post Acute Care Choice:    Choice offered to:     DME Arranged:    DME Agency:     HH Arranged:    HH Agency:     Status of Service:  In process, will continue to follow  If discussed at Long Length of Stay Meetings, dates discussed:    Additional CommentsPurcell Mouton, RN 10/27/2016, 12:15 PM

## 2016-10-27 NOTE — Progress Notes (Signed)
  Echocardiogram 2D Echocardiogram has been performed.  [name] M 10/27/2016, 2:13 PM

## 2016-10-27 NOTE — Progress Notes (Signed)
PROGRESS NOTE    Adrian Rios  BTD:[id number] DOB: 1940-09-14 DOA: 10/25/2016 PCP: [name], MD  Brief Narrative:Adrian Rios is a 76 y.o. male with medical history significant of bladder cancer status post cystoprostatectomy and ileal conduit in April 2018, ureteral stents, and these were removed in May 2018, frequent urinary infections, hyperlipidemia, prediabetes, GERD, depression, hearing loss, PVC, sarcoidosis admitted with fever chills, disorientation. No other symptoms. In ED UA positive with large leukocytes, positive nitrite, WBC of 16 and mildly elevated lactate, temp of 101.3.  Assessment & Plan:   Sepsis secondary to Pyelonephritis/urinary tract infection -In patient with complicated urological history status post cystoprostatectomy and ileal conduit -History of ESBL UTIs in the past -Continue IV meropenem day 3, Blood cx negative -Urine Cx pending -FU  CT abdomen pelvis due to recurrent infections-just completing 13hour contrast allerly protocol -Urology consult requested, Dr.Manny to FU today -stop IVF    Sarcoidosis ([place]) -Chronic cough is unchanged,  -CT chest only remarkable for chronic findings  -Follow-up with pulmonary, stable    History of bladder cancer status post cystoprostatectomy and ileal conduit -Follow-up with urology  Mild AKI  -resolved  Dyspnea-chronic -ongoing for few months -FU ECHO  DVT prophylaxis:Lovenox  Code Status: Full code  Family Communication:wife at bedside Disposition Plan: Home tomorrow if stable pending CT and culture data  Consultants:  Urology   Antimicrobials:  Meropenem 8/27  Subjective: Feels better, no distress, L flank pain resolved  Objective: Vitals:   10/26/16 1954 10/26/16 2202 10/27/16 0524 10/27/16 1209  BP:  125/62 (!) 116/58   Pulse:  77 65   Resp:  18 18   Temp:  99.5 F (37.5 C) 98.3 F (36.8 C)   TempSrc:  Oral Oral   SpO2: 95% 94% 93% 97%  Weight:      Height:        Intake/Output  Summary (Last 24 hours) at 10/27/16 1335 Last data filed at 10/27/16 0506  Gross per 24 hour  Intake              500 ml  Output             2900 ml  Net            -2400 ml   Filed Weights   10/25/16 1915 10/26/16 0242  Weight: 73.5 kg (162 lb) 73.5 kg (162 lb 0.6 oz)    Examination:  Gen: Awake, Alert, Oriented X 3, no distress, more pleasant  HEENT: PERRLA Lungs: Good air movement bilaterally, CTAB CVS: RRR,No Gallops,Rubs or new Murmurs Abd: soft,no flank tenderness today, NT, BS present Extremities: No Cyanosis, Clubbing or edema Skin: no new rashes    Data Reviewed:   CBC:  Recent Labs Lab 10/25/16 1937 10/26/16 0412  WBC 16.2* 13.3*  NEUTROABS 13.4*  --   HGB 11.3* 9.7*  HCT 35.0* 30.7*  MCV 85.0 84.1  PLT 496* 106*   Basic Metabolic Panel:  Recent Labs Lab 10/25/16 1937 10/26/16 0412  NA 136 140  K 3.6 3.3*  CL 107 112*  CO2 21* 23  GLUCOSE 181* 132*  BUN 26* 20  CREATININE 1.21 1.05  CALCIUM 9.2 8.2*   GFR: Estimated Creatinine Clearance: 56 mL/min (by C-G formula based on SCr of 1.05 mg/dL). Liver Function Tests:  Recent Labs Lab 10/25/16 1937  AST 26  ALT 18  ALKPHOS 109  BILITOT 0.1*  PROT 8.2*  ALBUMIN 3.5   No results for input(s): LIPASE, AMYLASE  in the last 168 hours. No results for input(s): AMMONIA in the last 168 hours. Coagulation Profile:  Recent Labs Lab 10/25/16 1937  INR 0.99   Cardiac Enzymes: No results for input(s): CKTOTAL, CKMB, CKMBINDEX, TROPONINI in the last 168 hours. BNP (last 3 results) No results for input(s): PROBNP in the last 8760 hours. HbA1C: No results for input(s): HGBA1C in the last 72 hours. CBG: No results for input(s): GLUCAP in the last 168 hours. Lipid Profile: No results for input(s): CHOL, HDL, LDLCALC, TRIG, CHOLHDL, LDLDIRECT in the last 72 hours. Thyroid Function Tests: No results for input(s): TSH, T4TOTAL, FREET4, T3FREE, THYROIDAB in the last 72 hours. Anemia Panel: No  results for input(s): VITAMINB12, FOLATE, FERRITIN, TIBC, IRON, RETICCTPCT in the last 72 hours. Urine analysis:    Component Value Date/Time   COLORURINE YELLOW 10/25/2016 1937   APPEARANCEUR CLOUDY (A) 10/25/2016 1937   LABSPEC 1.014 10/25/2016 1937   PHURINE 6.5 10/25/2016 1937   GLUCOSEU NEGATIVE 10/25/2016 1937   GLUCOSEU NEGATIVE 08/21/2016 1033   HGBUR SMALL (A) 10/25/2016 1937   HGBUR negative 07/11/2009 0813   BILIRUBINUR NEGATIVE 10/25/2016 1937   KETONESUR NEGATIVE 10/25/2016 1937   PROTEINUR NEGATIVE 10/25/2016 1937   UROBILINOGEN 0.2 08/21/2016 1033   NITRITE POSITIVE (A) 10/25/2016 1937   LEUKOCYTESUR LARGE (A) 10/25/2016 1937   Sepsis Labs: @LABRCNTIP (procalcitonin:4,lacticidven:4)  ) Recent Results (from the past 240 hour(s))  Urine Culture     Status: Abnormal   Collection Time: 10/25/16  7:37 PM    Ref Range Status      Final      Final      Final   Report Status 10/27/2016 FINAL  Final  Culture, blood (Routine x 2)     Status: None (Preliminary result)   Collection Time: 10/25/16  7:38 PM  Result Value Ref Range Status   Specimen Description BLOOD BLOOD RIGHT FOREARM  Final   Special Requests   Final    Blood Culture adequate volume BOTTLES DRAWN AEROBIC AND ANAEROBIC   Culture   Final    NO GROWTH 1 DAY Performed at [facility], [place] [address] [place], [place] [address]    Report Status PENDING  Incomplete  Culture, blood (Routine x 2)     Status: None (Preliminary result)   Collection Time: 10/25/16  7:45 PM  Result Value Ref Range Status   Specimen Description BLOOD LEFT ANTECUBITAL  Final   Special Requests   Final    BOTTLES DRAWN AEROBIC AND ANAEROBIC Blood Culture results may not be optimal due to an excessive volume of blood received in culture bottles   Culture   Final    NO GROWTH 1 DAY Performed at [facility], [place] [address], [place], [place] [address]    Report Status PENDING  Incomplete         Radiology  Studies: Dg Chest 2 View  Result Date: 10/25/2016 CLINICAL DATA:  Fevers EXAM: CHEST  2 VIEW COMPARISON:  09/08/2016 FINDINGS: The heart size and mediastinal contours are within normal limits. Both lungs are clear. The visualized skeletal structures are unremarkable. IMPRESSION: No active cardiopulmonary disease. Electronically Signed   By: Inez Catalina M.D.   On: 10/25/2016 20:45   Ct Chest Wo Contrast  Result Date: 10/25/2016 CLINICAL DATA:  Shortness of breath. Fever and urinary tract infection. History of sarcoidosis, bladder or prostate cancer. EXAM: CT CHEST WITHOUT CONTRAST TECHNIQUE: Multidetector CT imaging of the chest was performed following the  standard protocol without IV contrast. COMPARISON:  Chest radiograph October 25, 2016 at 2018 hours FINDINGS: CARDIOVASCULAR: Heart size is normal. Mild coronary artery calcifications. No pericardial effusion. Thoracic aorta is normal course and caliber, mild calcific atherosclerosis. LEFT vertebral artery arises directly from the aortic arch, normal variant. MEDIASTINUM/NODES: No mediastinal mass. No lymphadenopathy by CT size criteria though sensitivity decreased without oral contrast. Normal appearance of thoracic esophagus though not tailored for evaluation. LUNGS/PLEURA: Tracheobronchial tree is patent, no pneumothorax. No pleural effusions, focal consolidations, pulmonary nodules or masses. Mildly thickened RIGHT minor fissure. Minimal bibasilar atelectasis/ scarring. UPPER ABDOMEN: Nonacute. MUSCULOSKELETAL: Included soft tissues and included osseous structures appear normal. IMPRESSION: Minimal bibasilar atelectasis/ scarring. Aortic Atherosclerosis (ICD10-I70.0). Electronically Signed   By: Elon Alas M.D.   On: 10/25/2016 21:43      Scheduled Meds: . dextromethorphan-guaiFENesin  1 tablet Oral BID  . enoxaparin (LOVENOX) injection  40 mg Subcutaneous Q24H  . famotidine  20 mg Oral QHS  . feeding supplement (ENSURE ENLIVE)  237 mL  Oral BID BM  . iopamidol      . magnesium oxide  400 mg Oral Daily  . mometasone-formoterol  2 puff Inhalation BID  . montelukast  10 mg Oral Daily  . triamcinolone  1 spray Nasal QHS  . venlafaxine  75 mg Oral Daily   Continuous Infusions: . meropenem (MERREM) IV Stopped (10/27/16 0536)  . sodium chloride       LOS: 2 days    Time spent: 45min    Domenic Polite, MD [facility] Pager [phone]  If 7PM-7AM, please contact night-coverage [email or web address] Password [email or web address] 10/27/2016, 1:35 PM

## 2016-10-27 NOTE — CV Procedure (Signed)
Echocardiogram not completed at 8:45 am, because the patient said his CT was scheduled for the same time. I will be back to perform at a later time.   [name] RDCS

## 2016-10-28 ENCOUNTER — Encounter: Payer: Self-pay | Admitting: Internal Medicine

## 2016-10-28 ENCOUNTER — Telehealth: Payer: Self-pay | Admitting: Interventional Cardiology

## 2016-10-28 LAB — CBC
HCT: 30.7 % — ABNORMAL LOW (ref 39.0–52.0)
HEMOGLOBIN: 9.9 g/dL — AB (ref 13.0–17.0)
MCH: 27.5 pg (ref 26.0–34.0)
MCHC: 32.2 g/dL (ref 30.0–36.0)
MCV: 85.3 fL (ref 78.0–100.0)
PLATELETS: 424 10*3/uL — AB (ref 150–400)
RBC: 3.6 MIL/uL — AB (ref 4.22–5.81)
RDW: 15 % (ref 11.5–15.5)
WBC: 19.4 10*3/uL — AB (ref 4.0–10.5)

## 2016-10-28 LAB — BASIC METABOLIC PANEL
ANION GAP: 5 (ref 5–15)
BUN: 27 mg/dL — ABNORMAL HIGH (ref 6–20)
CO2: 23 mmol/L (ref 22–32)
Calcium: 9 mg/dL (ref 8.9–10.3)
Chloride: 114 mmol/L — ABNORMAL HIGH (ref 101–111)
Creatinine, Ser: 1.04 mg/dL (ref 0.61–1.24)
GFR calc Af Amer: >60 mL/min (ref >60)
GLUCOSE: 130 mg/dL — AB (ref 65–99)
POTASSIUM: 4 mmol/L (ref 3.5–5.1)
SODIUM: 142 mmol/L (ref 135–145)

## 2016-10-28 MED ORDER — DM-GUAIFENESIN ER 30-600 MG PO TB12: 1.0000 | ORAL_TABLET | Freq: Two times a day (BID) | ORAL | 0 refills | Status: DC | PRN

## 2016-10-28 MED ORDER — ENSURE ENLIVE PO LIQD: 237.0000 mL | Freq: Two times a day (BID) | ORAL | 12 refills | Status: DC

## 2016-10-28 MED ORDER — SACCHAROMYCES BOULARDII 250 MG PO CAPS: 250.0000 mg | ORAL_CAPSULE | Freq: Two times a day (BID) | ORAL | 0 refills | Status: DC

## 2016-10-28 MED ORDER — DOCUSATE SODIUM 100 MG PO CAPS: 100.0000 mg | ORAL_CAPSULE | Freq: Every day | ORAL | 0 refills | Status: DC | PRN

## 2016-10-28 MED ORDER — METHENAMINE MANDELATE 1 G PO TABS: 1000.0000 mg | ORAL_TABLET | Freq: Two times a day (BID) | ORAL | 0 refills | Status: DC

## 2016-10-28 MED ORDER — SACCHAROMYCES BOULARDII 250 MG PO CAPS: 250.0000 mg | ORAL_CAPSULE | Freq: Two times a day (BID) | ORAL | Status: DC

## 2016-10-28 MED ORDER — CIPROFLOXACIN HCL 500 MG PO TABS: 500.0000 mg | ORAL_TABLET | Freq: Two times a day (BID) | ORAL | Status: DC

## 2016-10-28 MED ORDER — CIPROFLOXACIN HCL 500 MG PO TABS: 500.0000 mg | ORAL_TABLET | Freq: Two times a day (BID) | ORAL | 0 refills | Status: DC

## 2016-10-28 NOTE — Discharge Instructions (Signed)
Methenamine Hippurate tablets What is this medicine? METHENAMINE (meth EN a meen) is used to prevent urinary tract infections due to bacteria. It is not used to treat an active infection. It will not work for colds, flu, or other viral infections. This medicine may be used for other purposes; ask your health care provider or pharmacist if you have questions. COMMON BRAND NAME(S): Hiprex, Urex What should I tell my health care provider before I take this medicine? They need to know if you have any of these conditions: -dehydrated -kidney disease -liver disease -an unusual or allergic reaction to methenamine, tartrazine dye, other medicines, foods, dyes, or preservatives -pregnant or trying to get pregnant -breast-feeding How should I use this medicine? Take this medicine by mouth with a full glass of water. Follow the directions on the prescription label. Do not crush or chew. Take your medicine at regular intervals. Do not take your medicine more often than directed. Take all of your medicine as directed even if you think you are better. Do not skip doses or stop your medicine early. Talk to your pediatrician regarding the use of this medicine in children. While this drug may be prescribed for children as young as 36 years old for selected conditions, precautions do apply. Overdosage: If you think you have taken too much of this medicine contact a poison control center or emergency room at once. NOTE: This medicine is only for you. Do not share this medicine with others. What if I miss a dose? If you miss a dose, take it as soon as you can. If it is almost time for your next dose, take only that dose. Do not take double or extra doses. What may interact with this medicine? Do not take this medicine with any of the following medications: -sulfa drugs This medicine may also interact with the following medications: -acetazolamide -antacids -methazolamide -sodium bicarbonate This list may not  describe all possible interactions. Give your health care provider a list of all the medicines, herbs, non-prescription drugs, or dietary supplements you use. Also tell them if you smoke, drink alcohol, or use illegal drugs. Some items may interact with your medicine. What should I watch for while using this medicine? Tell your doctor or health care professional if your symptoms do not improve or if you get new symptoms. You will need to be on a special diet while taking this medicine. Ask your health care professional how many glasses of water or other fluids to drink each day. Also, ask which foods to include and which to avoid to help keep your urine acidic. Your urine must be acidic for this medicine to work. What side effects may I notice from receiving this medicine? Side effects that you should report to your doctor or health care professional as soon as possible: -allergic reactions like skin rash, itching or hives, swelling of the face, lips, or tongue -bladder pain, irritation -dark urine -lower back pain -painful, frequent urination Side effects that usually do not require medical attention (report to your doctor or health care professional if they continue or are bothersome): -diarrhea -loss of appetite -mouth sores -nausea -stomach upset This list may not describe all possible side effects. Call your doctor for medical advice about side effects. You may report side effects to FDA at 1-800-FDA-1088. Where should I keep my medicine? Keep out of the reach of children. Store between 15 and 30 degrees C (59 and 86 degrees F). Keep container tightly closed. Throw away any unused medicine  after the expiration date. NOTE: This sheet is a summary. It may not cover all possible information. If you have questions about this medicine, talk to your doctor, pharmacist, or health care provider.  2018 Elsevier/Gold Standard (2013-10-12 12:52:35)

## 2016-10-28 NOTE — Care Management Important Message (Signed)
Important Message  Patient Details  Name: Adrian Rios MRN: [id number] Date of Birth: October 23, 1940   Medicare Important Message Given:  Yes    Kerin Salen 10/28/2016, 10:44 AMImportant Message  Patient Details  Name: Adrian Rios MRN: [id number] Date of Birth: Jun 29, 1940   Medicare Important Message Given:  Yes    Kerin Salen 10/28/2016, 10:44 AM

## 2016-10-28 NOTE — Telephone Encounter (Signed)
New message     Pt wife is calling stating they are calling about his monitor results. She states he turned it in on Thursday.

## 2016-10-28 NOTE — Telephone Encounter (Signed)
Called and spoke to patient. Made him aware that the monitor report has been sent to Dr. Irish Lack to read and that we will call him once it has been reviewed by him. Patient verbalized understanding and thanked me for the call.

## 2016-10-28 NOTE — Discharge Summary (Signed)
[facility] Discharge Summary   Patient: Adrian Rios:[id number]   PCP: [name], MD DOB: Dec 01, 1940   Date of admission: 10/25/2016   Date of discharge: 10/28/2016    Discharge Diagnoses:  Principal Problem:   Complicated UTI (urinary tract infection) Active Problems:   Sarcoidosis ([place])   Esophageal reflux   Bladder cancer ([place])   Sepsis ([place])   AKI (acute kidney injury) ([place])   Admitted From: home Disposition:  home  Recommendations for Outpatient Follow-up:  1. Please follow up with PCP and urology    Follow-up Information    [name], MD. Schedule an appointment as soon as possible for a visit in 1 week(s).   Specialty:  Internal Medicine Contact information: [name] [address] [phone]        Alexis Frock, MD. Schedule an appointment as soon as possible for a visit in 2 week(s).   Specialty:  Urology Contact information: [place] [address] [phone]          Diet recommendation: regular diet  Activity: The patient is advised to gradually reintroduce usual activities.  Discharge Condition: good  Code Status: full code  History of present illness: As per the H and P dictated on admission, "Adrian Rios is a 76 y.o. male with medical history significant of hyperlipidemia, prediabetes, GERD, depression, hearing loss, PVC, sarcoidosis, prostate cancer, bilateral cancer (urostomy), who presents with fever and chills.  Patient states that he has been having fever and chills since yesterday morning. He has temperature 101.5 at home. Patient is s/p of ureostomy, therefore he never had dysuria or burning on urination. He did not notice any urine color change or hematuria. Patient states that he has chronic mild dry cough and shortness of breath on exertion due to sarcoidosis, which has not changed. Patient does not have chest pain, tenderness in the calf areas. Patient denies nausea, vomiting,  diarrhea, abdominal pain, symptoms of UTI."  Hospital Course:  Summary of his active problems in the hospital is as following. Sepsis secondary to Pyelonephritis/urinary tract infection -In patient with complicated urological history status post cystoprostatectomy and ileal conduit -History of ESBL UTIs in the past -Continue IV meropenem day 3, Blood cx negative -Urine Cx pending -FU  CT abdomen pelvis due to recurrent infections-just completing 13hour contrast allerly protocol -Urology consult requested, Dr.Manny On Cipro and methenamine     Sarcoidosis ([facility]) -Chronic cough is unchanged,  -CT chest only remarkable for chronic findings  -Follow-up with pulmonary, stable    History of bladder cancer status post cystoprostatectomy and ileal conduit -Follow-up with urology  Mild AKI  -resolved  Dyspnea-chronic -ongoing for few months -Echocardiogram shows preserved EF, diastolic dysfunction  All other chronic medical condition were stable during the hospitalization.  Patient was ambulatory without any assistance. On the day of the discharge the patient's vitals were stable, and no other acute medical condition were reported by patient. the patient was felt safe to be discharge at home with family.  Procedures and Results:  Echocardiogram    Consultations:  Urology  DISCHARGE MEDICATION: Discharge Medication List as of 10/28/2016  1:49 PM    START taking these medications   Details  ciprofloxacin (CIPRO) 500 MG tablet Take 1 tablet (500 mg total) by mouth 2 (two) times daily., Starting Wed 10/28/2016, Until Sat 11/07/2016, Normal    dextromethorphan-guaiFENesin (MUCINEX DM) 30-600 MG 12hr tablet Take 1 tablet by mouth 2 (two) times daily as  needed for cough., Starting Wed 10/28/2016, Normal    feeding supplement, ENSURE ENLIVE, (ENSURE ENLIVE) LIQD Take 237 mLs by mouth 2 (two) times daily between meals., Starting Wed 10/28/2016, Normal    methenamine (MANDELAMINE) 1 g  tablet Take 1 tablet (1,000 mg total) by mouth 2 (two) times daily., Starting Wed 10/28/2016, Normal    saccharomyces boulardii (FLORASTOR) 250 MG capsule Take 1 capsule (250 mg total) by mouth 2 (two) times daily., Starting Wed 10/28/2016, Until Tue 11/17/2016, Normal      CONTINUE these medications which have CHANGED   Details  docusate sodium (COLACE) 100 MG capsule Take 1 capsule (100 mg total) by mouth daily as needed for mild constipation., Starting Wed 10/28/2016, Normal      CONTINUE these medications which have NOT CHANGED   Details  ADVAIR DISKUS 250-50 MCG/DOSE AEPB INHALE 1 DOSE BY MOUTH EVERY 12 HOURS, Normal    albuterol (PROVENTIL HFA;VENTOLIN HFA) 108 (90 Base) MCG/ACT inhaler Inhale 2 puffs into the lungs every 6 (six) hours as needed for wheezing or shortness of breath., Starting Tue 08/27/2015, Normal    magnesium oxide (MAG-OX) 400 MG tablet Take 400 mg by mouth daily., Historical Med    Misc Natural Products (GLUCOSAMINE CHOND DOUBLE STR PO) Take 1 tablet by mouth 2 (two) times daily., Historical Med    montelukast (SINGULAIR) 10 MG tablet TAKE ONE TABLET BY MOUTH ONCE DAILY, Normal    ranitidine (ZANTAC) 150 MG tablet TAKE ONE TABLET BY MOUTH AT BEDTIME, Normal    triamcinolone (NASACORT ALLERGY 24HR) 55 MCG/ACT AERO nasal inhaler Place 1 spray into the nose at bedtime., Historical Med    venlafaxine (EFFEXOR) 75 MG tablet TAKE 1 TABLET BY MOUTH ONCE DAILY, Normal       Allergies  Allergen Reactions  . Ivp Dye [Iodinated Diagnostic Agents] Shortness Of Breath, Itching and Palpitations    "eyes itching,  Heart racing,  Effected breathing" 10-27-16 pt with 13 hr pre-meds for CT without any reaction-kj  . Shellfish Allergy Hives, Shortness Of Breath and Itching    Mostly crab  . Gabapentin Other (See Comments)    REACTION: dizziness and flushing  . Niacin-Lovastatin Er Other (See Comments)    Did not feel good on medication  . Adhesive [Tape] Rash    Use paper  tape   Discharge Instructions    Diet - low sodium heart healthy    Complete by:  As directed    Discharge instructions    Complete by:  As directed    It is important that you read following instructions as well as go over your medication list with RN to help you understand your care after this hospitalization.  Discharge Instructions: Please follow-up with PCP in one week  Please request your primary care physician to go over all Hospital Tests and Procedure/Radiological results at the follow up,  Please get all Hospital records sent to your PCP by signing hospital release before you go home.   Do not take more than prescribed Pain, Sleep and Anxiety Medications. You were cared for by a hospitalist during your hospital stay. If you have any questions about your discharge medications or the care you received while you were in the hospital after you are discharged, you can call the unit and ask to speak with the hospitalist on call if the hospitalist that took care of you is not available.  Once you are discharged, your primary care physician will handle any further medical issues. Please note  that NO REFILLS for any discharge medications will be authorized once you are discharged, as it is imperative that you return to your primary care physician (or establish a relationship with a primary care physician if you do not have one) for your aftercare needs so that they can reassess your need for medications and monitor your lab values. You Must read complete instructions/literature along with all the possible adverse reactions/side effects for all the Medicines you take and that have been prescribed to you. Take any new Medicines after you have completely understood and accept all the possible adverse reactions/side effects. Wear Seat belts while driving. If you have smoked or chewed Tobacco in the last 2 yrs please stop smoking and/or stop any Recreational drug use.   Increase activity slowly     Complete by:  As directed      Discharge Exam: Filed Weights   10/25/16 1915 10/26/16 0242  Weight: 73.5 kg (162 lb) 73.5 kg (162 lb 0.6 oz)   Vitals:   10/28/16 0500 10/28/16 0855  BP: (!) 112/59   Pulse: 65   Resp: 19   Temp: 98.7 F (37.1 C)   SpO2: 96% 97%   General: Appear in no distress, no Rash; Oral Mucosa moist. Cardiovascular: S1 and S2 Present, no Murmur, no JVD Respiratory: Bilateral Air entry present and Clear to Auscultation, no Crackles, no wheezes Abdomen: Bowel Sound present, Soft and no tenderness Extremities: no Pedal edema, no calf tenderness Neurology: Grossly no focal neuro deficit.  The results of significant diagnostics from this hospitalization (including imaging, microbiology, ancillary and laboratory) are listed below for reference.    Significant Diagnostic Studies: Dg Chest 2 View  Result Date: 10/25/2016 CLINICAL DATA:  Fevers EXAM: CHEST  2 VIEW COMPARISON:  09/08/2016 FINDINGS: The heart size and mediastinal contours are within normal limits. Both lungs are clear. The visualized skeletal structures are unremarkable. IMPRESSION: No active cardiopulmonary disease. Electronically Signed   By: Inez Catalina M.D.   On: 10/25/2016 20:45   Ct Chest Wo Contrast  Result Date: 10/25/2016 CLINICAL DATA:  Shortness of breath. Fever and urinary tract infection. History of sarcoidosis, bladder or prostate cancer. EXAM: CT CHEST WITHOUT CONTRAST TECHNIQUE: Multidetector CT imaging of the chest was performed following the standard protocol without IV contrast. COMPARISON:  Chest radiograph October 25, 2016 at 2018 hours FINDINGS: CARDIOVASCULAR: Heart size is normal. Mild coronary artery calcifications. No pericardial effusion. Thoracic aorta is normal course and caliber, mild calcific atherosclerosis. LEFT vertebral artery arises directly from the aortic arch, normal variant. MEDIASTINUM/NODES: No mediastinal mass. No lymphadenopathy by CT size criteria though  sensitivity decreased without oral contrast. Normal appearance of thoracic esophagus though not tailored for evaluation. LUNGS/PLEURA: Tracheobronchial tree is patent, no pneumothorax. No pleural effusions, focal consolidations, pulmonary nodules or masses. Mildly thickened RIGHT minor fissure. Minimal bibasilar atelectasis/ scarring. UPPER ABDOMEN: Nonacute. MUSCULOSKELETAL: Included soft tissues and included osseous structures appear normal. IMPRESSION: Minimal bibasilar atelectasis/ scarring. Aortic Atherosclerosis (ICD10-I70.0). Electronically Signed   By: Elon Alas M.D.   On: 10/25/2016 21:43   Ct Abdomen Pelvis W Contrast  Result Date: 10/27/2016 CLINICAL DATA:  Bladder carcinoma. Bladder ca protocol---13 hr pre-meds due to previous contrast allergy w/o reaction S/p cystoprostatectomy with ileal conduit 4/18 for bladder ca Recurrent, complicated UTI's EXAM: CT ABDOMEN AND PELVIS WITH CONTRAST TECHNIQUE: Multidetector CT imaging of the abdomen and pelvis was performed using the standard protocol following bolus administration of intravenous contrast. CONTRAST:  135mL ISOVUE-300 IOPAMIDOL (ISOVUE-300) INJECTION 61% COMPARISON:  03/05/16 FINDINGS: Lower chest: Lung bases are clear. Hepatobiliary: No focal hepatic lesion. No biliary duct dilatation. Gallbladder is normal. Common bile duct is normal. Pancreas: Pancreas is normal. No ductal dilatation. No pancreatic inflammation. Spleen: Normal spleen Adrenals/urinary tract: Adrenal glands normal. No enhancing renal lesion. Mild hydronephrosis and hydroureter on the LEFT leading up to the ileal conduit. Mild hydroureter on the RIGHT without hydronephrosis. Delayed imaging demonstrates contrast flowing into the ileal conduit through the LEFT and RIGHT ureter. Ileostomy exiting through the RIGHT abdominal wall midline appears normal. There are several small filling defects within ileal conduit several cm distal to the junction of the LEFT RIGHT ureter (image  63, series 8). Favor small amounts of debris or clot. Stomach/Bowel: Stomach and duodenum are normal. Small bowel small bowel anastomosis in the mid abdomen. Cecum is normal. Colon is normal. Several diverticular the descending colon rectum normal Vascular/Lymphatic: Abdominal aorta is normal caliber. There is no retroperitoneal or periportal lymphadenopathy. No pelvic lymphadenopathy. Reproductive: Post proctitis cystectomy Other: No free fluid. Musculoskeletal: No aggressive osseous lesion. IMPRESSION: 1. Ileal conduit with hydronephrosis and hydroureter of the LEFT renal collecting system. No evidence of obstruction of the conduit. 2. Several small filling defects within the ileal conduit are favored debris. 3. Incidental findings of atherosclerosis and diverticulosis. Electronically Signed   By: Suzy Bouchard M.D.   On: 10/27/2016 11:32    Microbiology: Recent Results (from the past 240 hour(s))  Urine Culture     Status: Abnormal   Collection Time: 10/25/16  7:37 PM  Result Value Ref Range Status   Specimen Description URINE, RANDOM  Final   Special Requests NONE  Final   Culture MULTIPLE SPECIES PRESENT, SUGGEST RECOLLECTION (A)  Final   Report Status 10/27/2016 FINAL  Final  Culture, blood (Routine x 2)     Status: None (Preliminary result)   Collection Time: 10/25/16  7:38 PM  Result Value Ref Range Status   Specimen Description BLOOD BLOOD RIGHT FOREARM  Final   Special Requests   Final    Blood Culture adequate volume BOTTLES DRAWN AEROBIC AND ANAEROBIC   Culture   Final    NO GROWTH 2 DAYS Performed at [facility], [place] [address], [place], [place] [address]    Report Status PENDING  Incomplete  Culture, blood (Routine x 2)     Status: None (Preliminary result)   Collection Time: 10/25/16  7:45 PM  Result Value Ref Range Status   Specimen Description BLOOD LEFT ANTECUBITAL  Final   Special Requests   Final    BOTTLES DRAWN AEROBIC AND ANAEROBIC Blood Culture results  may not be optimal due to an excessive volume of blood received in culture bottles   Culture   Final    NO GROWTH 2 DAYS Performed at [facility], [place] [address], [place], [place] [address]    Report Status PENDING  Incomplete     Labs: CBC:  Recent Labs Lab 10/25/16 1937 10/26/16 0412 10/28/16 0359  WBC 16.2* 13.3* 19.4*  NEUTROABS 13.4*  --   --   HGB 11.3* 9.7* 9.9*  HCT 35.0* 30.7* 30.7*  MCV 85.0 84.1 85.3  PLT 496* 421* 865*   Basic Metabolic Panel:  Recent Labs Lab 10/25/16 1937 10/26/16 0412 10/28/16 0359  NA 136 140 142  K 3.6 3.3* 4.0  CL 107 112* 114*  CO2 21* 23 23  GLUCOSE 181* 132* 130*  BUN 26* 20 27*  CREATININE 1.21 1.05 1.04  CALCIUM 9.2 8.2* 9.0   Liver Function Tests:  Recent Labs Lab 10/25/16 1937  AST 26  ALT 18  ALKPHOS 109  BILITOT 0.1*  PROT 8.2*  ALBUMIN 3.5   No results for input(s): LIPASE, AMYLASE in the last 168 hours. No results for input(s): AMMONIA in the last 168 hours. Cardiac Enzymes: No results for input(s): CKTOTAL, CKMB, CKMBINDEX, TROPONINI in the last 168 hours. BNP (last 3 results)  Recent Labs  10/25/16 1937  BNP 51.7   CBG: No results for input(s): GLUCAP in the last 168 hours. Time spent: 35 minutes  Signed:  Berle Mull  [facility] 10/28/2016, 5:19 PM

## 2016-10-29 ENCOUNTER — Encounter: Payer: Self-pay | Admitting: Internal Medicine

## 2016-10-29 ENCOUNTER — Encounter (HOSPITAL_BASED_OUTPATIENT_CLINIC_OR_DEPARTMENT_OTHER): Payer: Self-pay

## 2016-10-29 ENCOUNTER — Emergency Department (HOSPITAL_COMMUNITY)
Admission: EM | Admit: 2016-10-29 | Discharge: 2016-10-29 | Discharge status: Against Medical Advice | Payer: Medicare Other | Source: Home / Self Care

## 2016-10-29 ENCOUNTER — Emergency Department (HOSPITAL_BASED_OUTPATIENT_CLINIC_OR_DEPARTMENT_OTHER)
Admission: EM | Admit: 2016-10-29 | Discharge: 2016-10-30 | Disposition: A | Discharge status: Home / Self Care | Payer: Medicare Other | Attending: Emergency Medicine | Admitting: Emergency Medicine

## 2016-10-29 ENCOUNTER — Telehealth: Payer: Self-pay | Admitting: *Deleted

## 2016-10-29 ENCOUNTER — Encounter (HOSPITAL_COMMUNITY): Payer: Self-pay | Admitting: Emergency Medicine

## 2016-10-29 DIAGNOSIS — Z5321 Procedure and treatment not carried out due to patient leaving prior to being seen by health care provider: Secondary | ICD-10-CM | POA: Insufficient documentation

## 2016-10-29 DIAGNOSIS — D862 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: Secondary | ICD-10-CM | POA: Diagnosis not present

## 2016-10-29 DIAGNOSIS — N99528 Other complication of other external stoma of urinary tract: Secondary | ICD-10-CM

## 2016-10-29 DIAGNOSIS — R509 Fever, unspecified: Secondary | ICD-10-CM | POA: Diagnosis not present

## 2016-10-29 DIAGNOSIS — C679 Malignant neoplasm of bladder, unspecified: Secondary | ICD-10-CM | POA: Diagnosis not present

## 2016-10-29 DIAGNOSIS — Z85828 Personal history of other malignant neoplasm of skin: Secondary | ICD-10-CM | POA: Insufficient documentation

## 2016-10-29 DIAGNOSIS — Z79899 Other long term (current) drug therapy: Secondary | ICD-10-CM | POA: Diagnosis not present

## 2016-10-29 DIAGNOSIS — J45909 Unspecified asthma, uncomplicated: Secondary | ICD-10-CM | POA: Insufficient documentation

## 2016-10-29 DIAGNOSIS — R1032 Left lower quadrant pain: Secondary | ICD-10-CM | POA: Diagnosis present

## 2016-10-29 DIAGNOSIS — C61 Malignant neoplasm of prostate: Secondary | ICD-10-CM | POA: Diagnosis not present

## 2016-10-29 LAB — COMPREHENSIVE METABOLIC PANEL
ALT: 29 U/L (ref 17–63)
AST: 27 U/L (ref 15–41)
Albumin: 3.3 g/dL — ABNORMAL LOW (ref 3.5–5.0)
Alkaline Phosphatase: 95 U/L (ref 38–126)
Anion gap: 11 (ref 5–15)
BILIRUBIN TOTAL: 0.2 mg/dL — AB (ref 0.3–1.2)
BUN: 27 mg/dL — AB (ref 6–20)
CHLORIDE: 107 mmol/L (ref 101–111)
CO2: 22 mmol/L (ref 22–32)
CREATININE: 1.27 mg/dL — AB (ref 0.61–1.24)
Calcium: 9.2 mg/dL (ref 8.9–10.3)
GFR, EST NON AFRICAN AMERICAN: 53 mL/min — AB (ref >60)
Glucose, Bld: 105 mg/dL — ABNORMAL HIGH (ref 65–99)
Potassium: 3.8 mmol/L (ref 3.5–5.1)
Sodium: 140 mmol/L (ref 135–145)
TOTAL PROTEIN: 7.7 g/dL (ref 6.5–8.1)

## 2016-10-29 LAB — CBC WITH DIFFERENTIAL/PLATELET
BASOS ABS: 0 10*3/uL (ref 0.0–0.1)
BASOS PCT: 0 %
EOS ABS: 0.2 10*3/uL (ref 0.0–0.7)
EOS PCT: 1 %
HCT: 35.2 % — ABNORMAL LOW (ref 39.0–52.0)
HEMOGLOBIN: 11.4 g/dL — AB (ref 13.0–17.0)
LYMPHS ABS: 1.9 10*3/uL (ref 0.7–4.0)
Lymphocytes Relative: 11 %
MCH: 27.6 pg (ref 26.0–34.0)
MCHC: 32.4 g/dL (ref 30.0–36.0)
MCV: 85.2 fL (ref 78.0–100.0)
Monocytes Absolute: 1.3 10*3/uL — ABNORMAL HIGH (ref 0.1–1.0)
Monocytes Relative: 8 %
NEUTROS PCT: 80 %
Neutro Abs: 14.3 10*3/uL — ABNORMAL HIGH (ref 1.7–7.7)
PLATELETS: 530 10*3/uL — AB (ref 150–400)
RBC: 4.13 MIL/uL — AB (ref 4.22–5.81)
RDW: 14.9 % (ref 11.5–15.5)
WBC: 17.8 10*3/uL — AB (ref 4.0–10.5)

## 2016-10-29 LAB — I-STAT CG4 LACTIC ACID, ED
LACTIC ACID, VENOUS: 1.4 mmol/L (ref 0.5–1.9)
LACTIC ACID, VENOUS: 1.68 mmol/L (ref 0.5–1.9)

## 2016-10-29 MED ORDER — DEXTROSE 5 % IV SOLN
1.0000 g | Freq: Once | INTRAVENOUS | Status: AC
  Administered 2016-10-29: 1 g via INTRAVENOUS
  Filled 2016-10-29: qty 10

## 2016-10-29 MED ORDER — SODIUM CHLORIDE 0.9 % IV BOLUS (SEPSIS)
1000.0000 mL | Freq: Once | INTRAVENOUS | Status: AC
  Administered 2016-10-29: 1000 mL via INTRAVENOUS

## 2016-10-29 NOTE — ED Triage Notes (Signed)
Patient reports that he was discharged yesterday for sepsis and today started having fever again of 101. Patient took tylenol at 3pm today. Patient states that his heart rate was in 130s while at home today.

## 2016-10-29 NOTE — ED Notes (Signed)
Pt called for v/s recheck, no response from lobby 

## 2016-10-29 NOTE — ED Notes (Signed)
Called pt to take to treatment room  No response from lobby

## 2016-10-29 NOTE — Telephone Encounter (Signed)
Transition Care Management Follow-up Telephone Call   Date discharged? 10/28/16   How have you been since you were released from the hospital? Pt states he is doing fine   Do you understand why you were in the hospital? YES   Do you understand the discharge instructions? NO   Where were you discharged to? Home   Items Reviewed:  Medications reviewed: YES  Allergies reviewed: NO  Dietary changes reviewed: YES  Referrals reviewed: YES, still waiting for urologist appt   Functional Questionnaire:   Activities of Daily Living (ADLs):   He states he are independent in the following: ambulation, bathing and hygiene, feeding, continence, grooming, toileting and dressing States they require assistance with the following: ambulation   Any transportation issues/concerns?: YES   Any patient concerns? NO   Confirmed importance and date/time of follow-up visits scheduled YES, appt 11/03/16  Provider Appointment booked with [name]  Confirmed with patient if condition begins to worsen call PCP or go to the ER.  Patient was given the office number and encouraged to call back with question or concerns.  : YES

## 2016-10-29 NOTE — ED Triage Notes (Addendum)
C/o left flank pain and fever x today-d/c yesterday from [facility] for sepsis-last tylenol 10pm-pt [facility]BS from [facility] ED

## 2016-10-29 NOTE — ED Provider Notes (Signed)
[facility] Provider Note: [name], MD, FACEP  CSN: [id number] MRN: [id number] ARRIVAL: 10/29/16 at 2230 ROOM: MH05/MH05   CHIEF COMPLAINT  Flank Pain   HISTORY OF PRESENT ILLNESS  10/29/16 11:05 PM Adrian Rios is a 76 y.o. male who underwent cystoprostatectomy for bladder cancer in April of this year. He has had several infections since. He was admitted to [facility] on the 26th of this month for presumed urosepsis. He was treated with antibiotics and discharged yesterday on Cipro and methenamine. He was told to return if he developed a new fever. He developed fever today around noon. He went to [facility] ED where lab work was drawn but he was never seen due to excessive weight time. About 10 PM he took his temperature and it was 101.5. He took a gram of Tylenol with improvement. He denies chills, nausea or vomiting. He has had diarrhea which she attributes to recent antibiotic therapy. He has been having associated left flank pain which improved with Tylenol. He rates his pain as a 5 out of 10 right now. Recent CT scan shows hydronephrosis on the left but no obstructive uropathy. Left flank pain is not significantly changed with movement or palpation. He has baseline sarcoidosis and his breathing has been somewhat worse than baseline today.   Past Medical History:  Diagnosis Date  . Allergic rhinitis   . Arthritis   . Benign localized prostatic hyperplasia with lower urinary tract symptoms (LUTS)   . Bladder tumor   . Borderline glaucoma of right eye   . Cancer [facility])    bladder and prostate  . Carotid bruit    per duplex 03-11-2016 RICA 1-39%  . Chronic throat clearing   . DDD (degenerative disc disease), lumbar   . Depression   . ED (erectile dysfunction)   . Elevated PSA    urologist-  dr [name]--- s/p  prostate bx's  . GERD (gastroesophageal reflux disease)   . Hematuria   . History of chronic bronchitis   . 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 History of squamous cell carcinoma excision    several times  . Hyperlipidemia   . Pre-diabetes   . Premature ventricular contractions (PVCs) (VPCs)   . RAD (reactive airway disease)   . 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 Sensorineural hearing loss (SNHL) of both ears   . Wears glasses   . Wears hearing aid    bilateral    Past Surgical History:  Procedure Laterality Date  . APPENDECTOMY  2008  . CARDIOVASCULAR STRESS TEST  05/27/2010   normal nuclear study w/ no ischemia/  normal LV function and wall motion , ef 60%  . CYSTOSCOPY WITH INJECTION N/A 06/12/2016   Procedure: CYSTOSCOPY WITH INJECTION OF INDOCYANINE GREEN DYE;  Surgeon: Alexis Frock, MD;  Location: [facility];  Service: Urology;  Laterality: N/A;  . DEEP NECK LYMPH NODE BIOPSY / EXCISION  1970's   and Bronchoscopy w/ bx's ( dx Sarcoidosis)  . ILEOSTOMY    . MOHS SURGERY  2013 approx.    nasal; pre melanoma  . PARS PLANA VITRECTOMY Right 2007   repair macular pucker  . TONSILLECTOMY AND ADENOIDECTOMY  child  . TRANSURETHRAL RESECTION OF BLADDER TUMOR N/A 04/06/2016   Procedure: CYSTOSCOPY TRANSURETHRAL RESECTION OF BLADDER TUMORS (  TURBT);  Surgeon: Carolan Clines, MD;  Location: [facility];  Service: Urology;  Laterality: N/A;  . TRANSURETHRAL RESECTION OF PROSTATE      Family History  Problem Relation Age of Onset  . Stroke Mother        in her 63s  . Breast cancer Sister   . Heart disease Maternal Grandmother 84       MI   . Breast cancer Paternal Grandmother   . Heart disease Paternal Grandfather 63       MI  . Stroke Brother   . Diabetes Neg Hx     Social History  Substance Use Topics  . Smoking status: Never Smoker  . Smokeless tobacco: Never Used  . Alcohol use No    Prior to Admission medications   Medication Sig Start Date End Date Taking? Authorizing  Provider  ADVAIR DISKUS 250-50 MCG/DOSE AEPB INHALE 1 DOSE BY MOUTH EVERY 12 HOURS 08/21/16   Burns, Claudina Lick, MD  albuterol (PROVENTIL HFA;VENTOLIN HFA) 108 (90 Base) MCG/ACT inhaler Inhale 2 puffs into the lungs every 6 (six) hours as needed for wheezing or shortness of breath. 08/27/15   Burns, Claudina Lick, MD  ciprofloxacin (CIPRO) 500 MG tablet Take 1 tablet (500 mg total) by mouth 2 (two) times daily. 10/28/16 11/07/16  Lavina Hamman, MD  dextromethorphan-guaiFENesin [facility] DM) 30-600 MG 12hr tablet Take 1 tablet by mouth 2 (two) times daily as needed for cough. 10/28/16   Lavina Hamman, MD  docusate sodium (COLACE) 100 MG capsule Take 1 capsule (100 mg total) by mouth daily as needed for mild constipation. 10/28/16   Lavina Hamman, MD  feeding supplement, ENSURE ENLIVE, (ENSURE ENLIVE) LIQD Take 237 mLs by mouth 2 (two) times daily between meals. 10/28/16   Lavina Hamman, MD  magnesium oxide (MAG-OX) 400 MG tablet Take 400 mg by mouth daily.    [provider]  methenamine (MANDELAMINE) 1 g tablet Take 1 tablet (1,000 mg total) by mouth 2 (two) times daily. 10/28/16   Lavina Hamman, MD  Misc Natural Products (GLUCOSAMINE CHOND DOUBLE STR PO) Take 1 tablet by mouth 2 (two) times daily.    [provider]  montelukast (SINGULAIR) 10 MG tablet TAKE ONE TABLET BY MOUTH ONCE DAILY 08/21/16   [name], MD  ranitidine (ZANTAC) 150 MG tablet TAKE ONE TABLET BY MOUTH AT BEDTIME 07/01/16   Burns, Claudina Lick, MD  saccharomyces boulardii (FLORASTOR) 250 MG capsule Take 1 capsule (250 mg total) by mouth 2 (two) times daily. 10/28/16 11/17/16  Lavina Hamman, MD  triamcinolone (NASACORT ALLERGY 24HR) 55 MCG/ACT AERO nasal inhaler Place 1 spray into the nose at bedtime.    [provider]  venlafaxine (EFFEXOR) 75 MG tablet TAKE 1 TABLET BY MOUTH ONCE DAILY 09/28/16   [name], MD    Allergies Ivp dye [iodinated diagnostic agents]; Shellfish allergy; Gabapentin;  Niacin-lovastatin er; and Adhesive [tape]   REVIEW OF SYSTEMS  Negative except as noted here or in the History of Present Illness.   PHYSICAL EXAMINATION  Initial Vital Signs Blood pressure 139/76, pulse (!) 118, temperature 100.3 F (37.9 C), temperature source Oral, resp. rate (!) 32, height 5\' 7"  (1.702 m), weight 74.8 kg (165 lb), SpO2 97 %.  Examination General: Well-developed, well-nourished male in no acute distress; appearance consistent with age of record HENT: normocephalic; atraumatic Eyes: Left pupil round and reactive to light; right pupil irregular; extraocular muscles intact;  Neck:  supple Heart: regular rate and rhythm Lungs: clear to auscultation bilaterally Abdomen: soft; nondistended; mild diffuse tenderness; no masses or hepatosplenomegaly; urostomy to right of umbilicus draining somewhat cloudy yellow urine bowel sounds present Extremities: No deformity; full range of motion; pulses normal Neurologic: Awake, alert and oriented; motor function intact in all extremities and symmetric; no facial droop Skin: Warm and dry Psychiatric: Normal mood and affect   RESULTS  Summary of this visit's results, reviewed by myself:   EKG Interpretation  Date/Time:    Ventricular Rate:    PR Interval:    QRS Duration:   QT Interval:    QTC Calculation:   R Axis:     Text Interpretation:        Laboratory Studies: Results for orders placed or performed during the hospital encounter of 10/29/16 (from the past 24 hour(s))  I-Stat CG4 Lactic Acid, ED     Status: None   Collection Time: 10/29/16 11:35 PM  Result Value Ref Range   Lactic Acid, Venous 1.68 0.5 - 1.9 mmol/L  Urinalysis, Routine w reflex microscopic     Status: Abnormal   Collection Time: 10/29/16 11:41 PM  Result Value Ref Range   Color, Urine YELLOW YELLOW   APPearance CLOUDY (A) CLEAR   Specific Gravity, Urine 1.020 1.005 - 1.030   pH 6.5 5.0 - 8.0   Glucose, UA NEGATIVE NEGATIVE mg/dL   Hgb urine  dipstick MODERATE (A) NEGATIVE   Bilirubin Urine NEGATIVE NEGATIVE   Ketones, ur NEGATIVE NEGATIVE mg/dL   Protein, ur NEGATIVE NEGATIVE mg/dL   Nitrite NEGATIVE NEGATIVE   Leukocytes, UA LARGE (A) NEGATIVE  Urinalysis, Microscopic (reflex)     Status: Abnormal   Collection Time: 10/29/16 11:41 PM  Result Value Ref Range   RBC / HPF 6-30 0 - 5 RBC/hpf   WBC, UA TOO NUMEROUS TO COUNT 0 - 5 WBC/hpf   Bacteria, UA FEW (A) NONE SEEN   Squamous Epithelial / LPF 0-5 (A) NONE SEEN   Hyphae Yeast PRESENT    Imaging Studies: No results found.  ED COURSE  Nursing notes and initial vitals signs, including pulse oximetry, reviewed.  Vitals:   10/29/16 2237 10/29/16 2238 10/29/16 2330  BP:  139/76   Pulse:  (!) 118   Resp:  (!) 32   Temp:  100.3 F (37.9 C) 99.7 F (37.6 C)  TempSrc:  Oral   SpO2:  97%   Weight: 74.8 kg (165 lb)    Height: 5\' 7"  (1.702 m)     12:45 AM Patient given IV fluid bolus and Rocephin one gram IV in the ED. He does not meet sepsis criteria and is nontoxic. I do not feel that hospitalization is indicated at this time. He will contact his primary care physician and his urologist, Dr. Tresa Moore, later this morning. He was advised to return if symptoms worsen.  PROCEDURES    ED DIAGNOSES     ICD-10-CM   1. Complication of urostomy ([place]) N99.528   2. Acute febrile illness R50.9        Noraa Pickeral, Jenny Reichmann, MD 10/30/16 [phone]

## 2016-10-30 DIAGNOSIS — N99528 Other complication of other external stoma of urinary tract: Secondary | ICD-10-CM | POA: Diagnosis not present

## 2016-10-30 DIAGNOSIS — N10 Acute tubulo-interstitial nephritis: Secondary | ICD-10-CM | POA: Diagnosis not present

## 2016-10-30 DIAGNOSIS — C678 Malignant neoplasm of overlapping sites of bladder: Secondary | ICD-10-CM | POA: Diagnosis not present

## 2016-10-30 LAB — URINALYSIS, MICROSCOPIC (REFLEX)

## 2016-10-30 LAB — URINALYSIS, ROUTINE W REFLEX MICROSCOPIC
Bilirubin Urine: NEGATIVE
Glucose, UA: NEGATIVE mg/dL
KETONES UR: NEGATIVE mg/dL
NITRITE: NEGATIVE
Protein, ur: NEGATIVE mg/dL
Specific Gravity, Urine: 1.02 (ref 1.005–1.030)
pH: 6.5 (ref 5.0–8.0)

## 2016-10-30 MED ORDER — FLUCONAZOLE 100 MG PO TABS
150.0000 mg | ORAL_TABLET | Freq: Once | ORAL | Status: AC
  Administered 2016-10-30: 150 mg via ORAL
  Filled 2016-10-30: qty 1

## 2016-10-30 NOTE — Progress Notes (Deleted)
Subjective:    Patient ID: Adrian Rios, male    DOB: [birth year], 76 y.o.   MRN: [id number]  HPI The patient is here for follow up from the hospital.  Admitted 10/25/16 - 10/28/16 for urosepsis.  He went to the ED after a day of fever of 101.5, chills for one day.  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  He did not notice any changes in his urine color or odor.  He did have a mild dry cough and DOE due to sarcoidosis, but that is chronic and unchanged.  He was found to be septic due to peylonephritis/UTI.  He was started on IV antibiotics. He had a Ct Ab/pelvis and urology saw the patient.  He had a ct of his chest that was unremarkable to for acute change.  He was discharged home on Cipro, methanamine and florastor.    He went back to the ED 10/29/16 for fever and left flank pain.   A Ct scan showed hydronephrosis on the left but no obstructive uropathy.   Re received IV Rocephin.  He was discharged home.   Medications and allergies reviewed with patient and updated if appropriate.  Patient Active Problem List   Diagnosis Date Noted  . UTI (urinary tract infection) 10/26/2016  . AKI (acute kidney injury) ([place]) 10/26/2016  . Complicated UTI (urinary tract infection) 10/26/2016  . Fatigue 08/19/2016  . Malnutrition of moderate degree 08/05/2016  . Sepsis ([place]) 08/04/2016  . History of bladder cancer 06/18/2016  . History of prostate cancer 06/18/2016  . 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 Bladder cancer ([place]) 06/11/2016  . Fever 05/16/2016  . Carotid bruit 02/17/2016  . Prediabetes 08/20/2015  . Overweight (BMI 25.0-29.9) 08/20/2015  . Esophageal reflux 08/20/2015  . Glaucoma 02/14/2015  . Varicose veins of lower extremities with other complications 86/76/1950  . RAD (reactive airway disease) 10/27/2011  . 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 Anemia, unspecified 10/27/2011  . 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 ERECTILE  DYSFUNCTION 04/23/2010  . [place] DISEASE, LUMBOSACRAL SPINE 02/06/2010  . 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 Depression 04/12/2008  . Allergic rhinitis 04/12/2008  . ELEVATED PROSTATE SPECIFIC ANTIGEN 04/12/2008  . Hyperlipidemia 10/05/2006  . Sarcoidosis ([place]) 07/05/2006    Current Outpatient Prescriptions on File Prior to Visit  Medication Sig Dispense Refill  . ADVAIR DISKUS 250-50 MCG/DOSE AEPB INHALE 1 DOSE BY MOUTH EVERY 12 HOURS 60 each 1  . albuterol (PROVENTIL HFA;VENTOLIN HFA) 108 (90 Base) MCG/ACT inhaler Inhale 2 puffs into the lungs every 6 (six) hours as needed for wheezing or shortness of breath. 1 Inhaler 8  . ciprofloxacin (CIPRO) 500 MG tablet Take 1 tablet (500 mg total) by mouth 2 (two) times daily. 20 tablet 0  . dextromethorphan-guaiFENesin (MUCINEX DM) 30-600 MG 12hr tablet Take 1 tablet by mouth 2 (two) times daily as needed for cough. 15 tablet 0  . docusate sodium (COLACE) 100 MG capsule Take 1 capsule (100 mg total) by mouth daily as needed for mild constipation. 10 capsule 0  . feeding supplement, ENSURE ENLIVE, (ENSURE ENLIVE) LIQD Take 237 mLs by mouth 2 (two) times daily between meals. 237 mL 12  . magnesium oxide (MAG-OX) 400 MG tablet Take 400 mg by mouth daily.    . methenamine (MANDELAMINE) 1 g tablet Take 1 tablet (1,000 mg total) by mouth 2 (two) times daily. 60 tablet 0  . Misc Natural Products (GLUCOSAMINE CHOND  DOUBLE STR PO) Take 1 tablet by mouth 2 (two) times daily.    . montelukast (SINGULAIR) 10 MG tablet TAKE ONE TABLET BY MOUTH ONCE DAILY 90 tablet 3  . ranitidine (ZANTAC) 150 MG tablet TAKE ONE TABLET BY MOUTH AT BEDTIME 90 tablet 1  . saccharomyces boulardii (FLORASTOR) 250 MG capsule Take 1 capsule (250 mg total) by mouth 2 (two) times daily. 40 capsule 0  . triamcinolone (NASACORT ALLERGY 24HR) 55 MCG/ACT AERO nasal inhaler Place 1 spray into the nose at bedtime.    Marland Kitchen venlafaxine (EFFEXOR) 75 MG tablet TAKE 1 TABLET BY MOUTH ONCE  DAILY 90 tablet 1   No current facility-administered medications on file prior to visit.     Past Medical History:  Diagnosis Date  . Allergic rhinitis   . Arthritis   . Benign localized prostatic hyperplasia with lower urinary tract symptoms (LUTS)   . Bladder tumor   . Borderline glaucoma of right eye   . Cancer [facility])    bladder and prostate  . Carotid bruit    per duplex 03-11-2016 RICA 1-39%  . Chronic throat clearing   . DDD (degenerative disc disease), lumbar   . Depression   . ED (erectile dysfunction)   . Elevated PSA    urologist-  dr [name]--- s/p  prostate bx's  . GERD (gastroesophageal reflux disease)   . Hematuria   . History of chronic bronchitis   . 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 History of squamous cell carcinoma excision    several times  . Hyperlipidemia   . Pre-diabetes   . Premature ventricular contractions (PVCs) (VPCs)   . RAD (reactive airway disease)   . 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 Sensorineural hearing loss (SNHL) of both ears   . Wears glasses   . Wears hearing aid    bilateral    Past Surgical History:  Procedure Laterality Date  . APPENDECTOMY  2008  . CARDIOVASCULAR STRESS TEST  05/27/2010   normal nuclear study w/ no ischemia/  normal LV function and wall motion , ef 60%  . CYSTOSCOPY WITH INJECTION N/A 06/12/2016   Procedure: CYSTOSCOPY WITH INJECTION OF INDOCYANINE GREEN DYE;  Surgeon: Alexis Frock, MD;  Location: [facility];  Service: Urology;  Laterality: N/A;  . DEEP NECK LYMPH NODE BIOPSY / EXCISION  1970's   and Bronchoscopy w/ bx's ( dx Sarcoidosis)  . ILEOSTOMY    . MOHS SURGERY  2013 approx.    nasal; pre melanoma  . PARS PLANA VITRECTOMY Right 2007   repair macular pucker  . TONSILLECTOMY AND ADENOIDECTOMY  child  . TRANSURETHRAL RESECTION OF BLADDER TUMOR N/A 04/06/2016   Procedure:  CYSTOSCOPY TRANSURETHRAL RESECTION OF BLADDER TUMORS (TURBT);  Surgeon: Carolan Clines, MD;  Location: [facility];  Service: Urology;  Laterality: N/A;  . TRANSURETHRAL RESECTION OF PROSTATE      Social History   Social History  . Marital status: Married    Spouse name: N/A  . Number of children: N/A  . Years of education: N/A   Social History Main Topics  . Smoking status: Never Smoker  . Smokeless tobacco: Never Used  . Alcohol use No  . Drug use: No  . Sexual activity: No   Other Topics Concern  . Not on file   Social  History Narrative  . No narrative on file    Family History  Problem Relation Age of Onset  . Stroke Mother        in her 77s  . Breast cancer Sister   . Heart disease Maternal Grandmother 84       MI   . Breast cancer Paternal Grandmother   . Heart disease Paternal Grandfather 21       MI  . Stroke Brother   . Diabetes Neg Hx     Review of Systems     Objective:  There were no vitals filed for this visit. Wt Readings from Last 3 Encounters:  10/29/16 165 lb (74.8 kg)  10/29/16 162 lb (73.5 kg)  10/26/16 162 lb 0.6 oz (73.5 kg)   There is no height or weight on file to calculate BMI.   Physical Exam    Constitutional: Appears well-developed and well-nourished. No distress.  HENT:  Head: Normocephalic and atraumatic.  Neck: Neck supple. No tracheal deviation present. No thyromegaly present.  No cervical lymphadenopathy Cardiovascular: Normal rate, regular rhythm and normal heart sounds.   No murmur heard. No carotid bruit .  No edema Pulmonary/Chest: Effort normal and breath sounds normal. No respiratory distress. No has no wheezes. No rales.  Abdomen: soft, non tender, non distended Skin: Skin is warm and dry. Not diaphoretic.  Psychiatric: Normal mood and affect. Behavior is normal.     ECHOCARDIOGRAM COMPLETE                            *[facility] [employer].                        [place], [place] [id number]                            [phone]  ------------------------------------------------------------------- Echocardiography  Patient:    [place], [place] MR #:       [id number] Study Date: 10/27/2016 Gender:     M Age:        89 Height:     170.2 cm Weight:     73.5 kg BSA:        1.88 m^2 Pt. Status: Room:       [address] [place]  Molly Maduro [id number]  Coral Else [id number]  PERFORMING   [facility], Inpatient  SONOGRAPHER  [name], RDCS  ATTENDING    Little, Wenda Overland  cc:  ------------------------------------------------------------------- LV EF: 60% -   65%  ------------------------------------------------------------------- Indications:      Dyspnea 786.09.  ------------------------------------------------------------------- History:   PMH:  Prostate Cancer. Fever. Chills. GERD. PVC.  PMH: Sarcoidosis in childhood.  Risk factors:  Hypertension.  ------------------------------------------------------------------- Study Conclusions  - Left ventricle: The cavity size was normal. There was mild focal   basal hypertrophy of the septum. Systolic function was normal.   The estimated ejection fraction was in the range of 60% to 65%.   Wall motion was normal; there were no regional wall motion  abnormalities. Doppler parameters are consistent with abnormal   left ventricular relaxation (grade 1 diastolic dysfunction). - Aorta: Ascending aortic diameter: 41 mm (S). - Ascending aorta: The ascending aorta was mildly dilated. - Mitral valve: Calcified annulus. There was trivial regurgitation. - Tricuspid valve: There was trivial regurgitation.  ------------------------------------------------------------------- Study data:  No prior study was available for comparison.  Study status:  Routine.  Procedure:  The patient reported no pain pre or post  test. Transthoracic echocardiography. Image quality was adequate.  Study completion:  There were no complications. Echocardiography.  M-mode, complete 2D, spectral Doppler, and color Doppler.  Birthdate:  Patient birthdate: 25-Mar-1940.  Age:  Patient is 76 yr old.  Sex:  Gender: male.    BMI: 25.4 kg/m^2.  Blood pressure:     116/58  Patient status:  Inpatient.  Study date: Study date: 10/27/2016. Study time: 01:44 PM.  Location:  Bedside.   -------------------------------------------------------------------  ------------------------------------------------------------------- Left ventricle:  The cavity size was normal. There was mild focal basal hypertrophy of the septum. Systolic function was normal. The estimated ejection fraction was in the range of 60% to 65%. Wall motion was normal; there were no regional wall motion abnormalities. Doppler parameters are consistent with abnormal left ventricular relaxation (grade 1 diastolic dysfunction).  ------------------------------------------------------------------- Aortic valve:   Trileaflet; normal thickness leaflets. Mobility was not restricted.  Doppler:  Transvalvular velocity was within the normal range. There was no stenosis. There was no regurgitation.   ------------------------------------------------------------------- Aorta:  Ascending aorta: The ascending aorta was mildly dilated.  ------------------------------------------------------------------- Mitral valve:   Calcified annulus. Mobility was not restricted. Doppler:  Transvalvular velocity was within the normal range. There was no evidence for stenosis. There was trivial regurgitation. Peak gradient (D): 3 mm Hg.  ------------------------------------------------------------------- Left atrium:  The atrium was normal in size.  ------------------------------------------------------------------- Right ventricle:  The cavity size was normal. Wall thickness was normal.  Systolic function was normal.  ------------------------------------------------------------------- Pulmonic valve:   Poorly visualized.  Structurally normal valve. Cusp separation was normal.  Doppler:  Transvalvular velocity was within the normal range. There was no evidence for stenosis. There was no regurgitation.  ------------------------------------------------------------------- Tricuspid valve:   Structurally normal valve.    Doppler: Transvalvular velocity was within the normal range. There was trivial regurgitation.  ------------------------------------------------------------------- Pulmonary artery:   The main pulmonary artery was normal-sized. Systolic pressure was within the normal range.  ------------------------------------------------------------------- Right atrium:  The atrium was normal in size.  ------------------------------------------------------------------- Pericardium:  There was no pericardial effusion.  ------------------------------------------------------------------- Systemic veins: Inferior vena cava: The vessel was normal in size.  ------------------------------------------------------------------- Measurements   Left ventricle                         Value        Reference  LV ID, ED, PLAX chordal                48.1  mm     43 - 52  LV ID, ES, PLAX chordal                28.8  mm     23 - 38  LV fx shortening, PLAX chordal         40    %      >=29  LV PW thickness, ED                    7.62  mm     ---------  IVS/LV PW ratio, ED            (H)     1.65         <=1.3  Stroke volume, 2D                      107   ml     ---------  Stroke volume/bsa, 2D                  57    ml/m^2 ---------  LV e&', lateral                         10.6  cm/s   ---------  LV E/e&', lateral                       7.49         ---------  LV e&', medial                          7.72  cm/s   ---------  LV E/e&', medial                        10.28         ---------  LV e&', average                         9.16  cm/s   ---------  LV E/e&', average                       8.67         ---------    Ventricular septum                     Value        Reference  IVS thickness, ED                      12.6  mm     ---------    LVOT                                   Value        Reference  LVOT ID, S                             22    mm     ---------  LVOT area                              3.8   cm^2   ---------  LVOT peak velocity, S                  128   cm/s   ---------  LVOT mean velocity, S                  93.4  cm/s   ---------  LVOT VTI, S                            28.2  cm     ---------  LVOT peak gradient, S  7     mm Hg  ---------    Aorta                                  Value        Reference  Aortic root ID, ED                     39    mm     ---------  Ascending aorta ID, A-P, S             41    mm     ---------    Left atrium                            Value        Reference  LA ID, A-P, ES                         33    mm     ---------  LA ID/bsa, A-P                         1.76  cm/m^2 <=2.2  LA volume, S                           48    ml     ---------  LA volume/bsa, S                       25.6  ml/m^2 ---------  LA volume, ES, 1-p A4C                 52.2  ml     ---------  LA volume/bsa, ES, 1-p A4C             27.8  ml/m^2 ---------  LA volume, ES, 1-p A2C                 42.4  ml     ---------  LA volume/bsa, ES, 1-p A2C             22.6  ml/m^2 ---------    Mitral valve                           Value        Reference  Mitral E-wave peak velocity            79.4  cm/s   ---------  Mitral A-wave peak velocity            77.1  cm/s   ---------  Mitral deceleration time       (H)     275   ms     150 - 230  Mitral peak gradient, D                3     mm Hg  ---------  Mitral E/A ratio, peak                 1            ---------    Pulmonary arteries  Value        Reference   PA pressure, S, DP                     29    mm Hg  <=30    Tricuspid valve                        Value        Reference  Tricuspid regurg peak velocity         254   cm/s   ---------  Tricuspid peak RV-RA gradient          26    mm Hg  ---------    Systemic veins                         Value        Reference  Estimated CVP                          3     mm Hg  ---------    Right ventricle                        Value        Reference  TAPSE                                  18.1  mm     ---------  RV pressure, S, DP                     29    mm Hg  <=30  RV s&', lateral, S                      19.2  cm/s   ---------  Legend: (L)  and  (H)  mark values outside specified reference range.  ------------------------------------------------------------------- Prepared and Electronically Authenticated by  Candee Furbish, M.D. 2018-08-28T15:04:37 CT ABDOMEN PELVIS W CONTRAST CLINICAL DATA:  Bladder carcinoma.  Bladder ca protocol---13 hr pre-meds due to previous contrast allergy w/o reaction S/p cystoprostatectomy with ileal conduit 4/18 for bladder ca Recurrent, complicated UTI's  EXAM: CT ABDOMEN AND PELVIS WITH CONTRAST  TECHNIQUE: Multidetector CT imaging of the abdomen and pelvis was performed using the standard protocol following bolus administration of intravenous contrast.  CONTRAST:  142mL ISOVUE-300 IOPAMIDOL (ISOVUE-300) INJECTION 61%  COMPARISON:  03/05/16  FINDINGS: Lower chest: Lung bases are clear.  Hepatobiliary: No focal hepatic lesion. No biliary duct dilatation. Gallbladder is normal. Common bile duct is normal.  Pancreas: Pancreas is normal. No ductal dilatation. No pancreatic inflammation.  Spleen: Normal spleen  Adrenals/urinary tract: Adrenal glands normal. No enhancing renal lesion. Mild hydronephrosis and hydroureter on the LEFT leading up to the ileal conduit. Mild hydroureter on the RIGHT without hydronephrosis. Delayed imaging demonstrates  contrast flowing into the ileal conduit through the LEFT and RIGHT ureter. Ileostomy exiting through the RIGHT abdominal wall midline appears normal.  There are several small filling defects within ileal conduit several cm distal to the junction of the LEFT RIGHT ureter (image 63, series 8). Favor small amounts of debris or clot.  Stomach/Bowel: Stomach and duodenum are normal. Small bowel small bowel anastomosis in the mid abdomen. Cecum is normal. Colon is normal. Several diverticular the  descending colon rectum normal  Vascular/Lymphatic: Abdominal aorta is normal caliber. There is no retroperitoneal or periportal lymphadenopathy. No pelvic lymphadenopathy.  Reproductive: Post proctitis cystectomy  Other: No free fluid.  Musculoskeletal: No aggressive osseous lesion.  IMPRESSION: 1. Ileal conduit with hydronephrosis and hydroureter of the LEFT renal collecting system. No evidence of obstruction of the conduit. 2. Several small filling defects within the ileal conduit are favored debris. 3. Incidental findings of atherosclerosis and diverticulosis.  Electronically Signed   By: Suzy Bouchard M.D.   On: 10/27/2016 11:32  This SmartLink has not been configured with any valid records.     Assessment & Plan:    See Problem List for Assessment and Plan of chronic medical problems.

## 2016-10-30 NOTE — ED Notes (Signed)
C/o left flank pain and fever x 1 day  Was just dc from [facility] for sepsis

## 2016-10-31 LAB — CULTURE, BLOOD (ROUTINE X 2)
CULTURE: NO GROWTH
Culture: NO GROWTH
Special Requests: ADEQUATE

## 2016-10-31 LAB — URINE CULTURE

## 2016-11-01 ENCOUNTER — Telehealth: Payer: Self-pay

## 2016-11-01 NOTE — Telephone Encounter (Signed)
No treatment for UC from ED 10/30/16 per [facility] Pharm D

## 2016-11-03 ENCOUNTER — Inpatient Hospital Stay: Payer: Medicare Other | Admitting: Internal Medicine

## 2016-11-04 ENCOUNTER — Encounter: Payer: Self-pay | Admitting: Internal Medicine

## 2016-11-04 ENCOUNTER — Other Ambulatory Visit: Payer: Medicare Other

## 2016-11-04 ENCOUNTER — Ambulatory Visit (INDEPENDENT_AMBULATORY_CARE_PROVIDER_SITE_OTHER): Payer: Medicare Other | Admitting: Internal Medicine

## 2016-11-04 VITALS — BP 110/68 | HR 66 | Ht 67.0 in | Wt 160.5 lb

## 2016-11-04 DIAGNOSIS — R194 Change in bowel habit: Secondary | ICD-10-CM

## 2016-11-04 DIAGNOSIS — Z936 Other artificial openings of urinary tract status: Secondary | ICD-10-CM

## 2016-11-04 DIAGNOSIS — Z8551 Personal history of malignant neoplasm of bladder: Secondary | ICD-10-CM

## 2016-11-04 DIAGNOSIS — R198 Other specified symptoms and signs involving the digestive system and abdomen: Secondary | ICD-10-CM | POA: Diagnosis not present

## 2016-11-04 DIAGNOSIS — K921 Melena: Secondary | ICD-10-CM

## 2016-11-04 LAB — CULTURE, BLOOD (ROUTINE X 2)
CULTURE: NO GROWTH
Culture: NO GROWTH
Special Requests: ADEQUATE
Special Requests: ADEQUATE

## 2016-11-04 MED ORDER — SUPREP BOWEL PREP KIT 17.5-3.13-1.6 GM/177ML PO SOLN: 1.0000 | ORAL | 0 refills | Status: DC

## 2016-11-04 NOTE — Patient Instructions (Signed)
Your physician has requested that you go to the basement for the following lab work before leaving today:  GI Pathogen panel  You have been scheduled for a colonoscopy. Please follow written instructions given to you at your visit today.  Please pick up your prep supplies at the pharmacy within the next 1-3 days. If you use inhalers (even only as needed), please bring them with you on the day of your procedure. Your physician has requested that you go to www.[email or web address].com and enter the access code given to you at your visit today. This web site gives a general overview about your procedure. However, you should still follow specific instructions given to you by our office regarding your preparation for the procedure.  If you are age 62 or older, your body mass index should be between 23-30. Your Body mass index is 25.14 kg/m. If this is out of the aforementioned range listed, please consider follow up with your Primary Care Provider.  If you are age 84 or younger, your body mass index should be between 19-25. Your Body mass index is 25.14 kg/m. If this is out of the aformentioned range listed, please consider follow up with your Primary Care Provider.

## 2016-11-04 NOTE — Progress Notes (Signed)
Patient ID: Adrian Rios, male   DOB: 1940/09/27, 76 y.o.   MRN: [id number] HPI: Adrian Rios is a 76 year old male with a past medical history of bladder cancer status post robotic cystoprostatectomy with ileal conduit complicated by recurrent UTI and admission for urosepsis, sarcoidosis, arthritis, prediabetes and hyperlipidemia who seen in consultation at the request of [name] for blood in stool and change in bowel habit. He is here alone today.  He reports that his symptoms with his bowel habits have changed since April when he had his bladder removal and ileal conduit. He reports that he has a hard time initiating bowel movement. He states that he has to strain and grunt to get his bowel movements started. Once they start he feels that he evacuates normally. Colace was recommended by primary care but due to frequent antibiotics leading to diarrhea he has not started this medication. He reports 6 urinary tract infections since his surgery. He reports that he has been on multiple antibiotics and each time he develops diarrhea. He is currently being treated with Monurol every other day and he feels that this has been helping. He is using Florastor 250 mg twice a day.  Currently he is having loose stools which she associates with the antibiotics. He denies abdominal pain. He does report 2 episodes of blood in his stools which he noted in the toilet. Blood was red and dark red. Painless in nature. Has not occurred in the last 6 weeks.  He has never had colonoscopy but reports a negative Cologuard some years ago. He denies a family history of colorectal cancer.  He sees Dr. Tresa Moore with [facility] and has an appt with [facility] on 11/14/2016 given recurrent urinary infections.  Past Medical History:  Diagnosis Date  . Allergic rhinitis   . Arthritis   . Benign localized prostatic hyperplasia with lower urinary tract symptoms (LUTS)   . Bladder tumor   . Borderline glaucoma of right eye   .  [facility]cer [facility])    bladder and prostate  . Carotid bruit    per duplex 03-11-2016 RICA 1-39%  . Chronic throat clearing   . DDD (degenerative disc disease), lumbar   . Depression   . ED (erectile dysfunction)   . Elevated PSA    urologist-  dr [name]--- s/p  prostate bx's  . GERD (gastroesophageal reflux disease)   . Hematuria   . History of chronic bronchitis   . 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 History of squamous cell carcinoma excision    several times  . Hyperlipidemia   . Pre-diabetes   . Premature ventricular contractions (PVCs) (VPCs)   . RAD (reactive airway disease)   . 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 Sensorineural hearing loss (SNHL) of both ears   . Sepsis ([place]) 09/2016  . UTI (urinary tract infection) 08/2016  . Wears glasses   . Wears hearing aid    bilateral    Past Surgical History:  Procedure Laterality Date  . APPENDECTOMY  2008  . CARDIOVASCULAR STRESS TEST  05/27/2010   normal nuclear study w/ no ischemia/  normal LV function and wall motion , ef 60%  . CYSTOSCOPY WITH INJECTION N/A 06/12/2016   Procedure: CYSTOSCOPY WITH INJECTION OF INDOCYANINE GREEN DYE;  Surgeon: Alexis Frock, MD;  Location: [facility];  Service: Urology;  Laterality: N/A;  . DEEP NECK LYMPH NODE BIOPSY / EXCISION  1970's   and Bronchoscopy w/ bx's ( dx Sarcoidosis)  . ILEOSTOMY    . MOHS SURGERY  2013 approx.    nasal; pre melanoma  . PARS PLANA VITRECTOMY Right 2007   repair macular pucker  . TONSILLECTOMY AND ADENOIDECTOMY  child  . TRANSURETHRAL RESECTION OF BLADDER TUMOR N/A 04/06/2016   Procedure: CYSTOSCOPY TRANSURETHRAL RESECTION OF BLADDER TUMORS (TURBT);  Surgeon: Carolan Clines, MD;  Location: [facility];  Service: Urology;  Laterality: N/A;  . TRANSURETHRAL RESECTION OF PROSTATE      Outpatient Medications Prior to Visit   Medication Sig Dispense Refill  . ADVAIR DISKUS 250-50 MCG/DOSE AEPB INHALE 1 DOSE BY MOUTH EVERY 12 HOURS 60 each 1  . albuterol (PROVENTIL HFA;VENTOLIN HFA) 108 (90 Base) MCG/ACT inhaler Inhale 2 puffs into the lungs every 6 (six) hours as needed for wheezing or shortness of breath. 1 Inhaler 8  . docusate sodium (COLACE) 100 MG capsule Take 1 capsule (100 mg total) by mouth daily as needed for mild constipation. 10 capsule 0  . feeding supplement, ENSURE ENLIVE, (ENSURE ENLIVE) LIQD Take 237 mLs by mouth 2 (two) times daily between meals. 237 mL 12  . magnesium oxide (MAG-OX) 400 MG tablet Take 400 mg by mouth daily.    . methenamine (MANDELAMINE) 1 g tablet Take 1 tablet (1,000 mg total) by mouth 2 (two) times daily. 60 tablet 0  . Misc Natural Products (GLUCOSAMINE CHOND DOUBLE STR PO) Take 1 tablet by mouth 2 (two) times daily.    . montelukast (SINGULAIR) 10 MG tablet TAKE ONE TABLET BY MOUTH ONCE DAILY 90 tablet 3  . ranitidine (ZANTAC) 150 MG tablet TAKE ONE TABLET BY MOUTH AT BEDTIME 90 tablet 1  . saccharomyces boulardii (FLORASTOR) 250 MG capsule Take 1 capsule (250 mg total) by mouth 2 (two) times daily. 40 capsule 0  . triamcinolone (NASACORT ALLERGY 24HR) 55 MCG/ACT AERO nasal inhaler Place 1 spray into the nose at bedtime.    Marland Kitchen venlafaxine (EFFEXOR) 75 MG tablet TAKE 1 TABLET BY MOUTH ONCE DAILY 90 tablet 1  . ciprofloxacin (CIPRO) 500 MG tablet Take 1 tablet (500 mg total) by mouth 2 (two) times daily. 20 tablet 0  . dextromethorphan-guaiFENesin (MUCINEX DM) 30-600 MG 12hr tablet Take 1 tablet by mouth 2 (two) times daily as needed for cough. 15 tablet 0   No facility-administered medications prior to visit.     Allergies  Allergen Reactions  . Ivp Dye [Iodinated Diagnostic Agents] Shortness Of Breath, Itching and Palpitations    "eyes itching,  Heart racing,  Effected breathing" 10-27-16 pt with 13 hr pre-meds for CT without any reaction-kj  . Shellfish Allergy Hives,  Shortness Of Breath and Itching    Mostly crab  . Gabapentin Other (See Comments)    REACTION: dizziness and flushing  . Niacin-Lovastatin Er Other (See Comments)    Did not feel good on medication  . Adhesive [Tape] Rash    Use paper tape    Family History  Problem Relation Age of Onset  . Stroke Mother        in her 51s  . Breast cancer Sister   . Heart disease Maternal Grandmother 84       MI   . Breast cancer Paternal Grandmother   . Heart disease Paternal Grandfather 37       MI  . Stroke Brother   .  Diabetes Neg Hx     Social History  Substance Use Topics  . Smoking status: Never Smoker  . Smokeless tobacco: Never Used  . Alcohol use No    ROS: As per history of present illness, otherwise negative  BP 110/68   Pulse 66   Ht 5\' 7"  (1.702 m)   Wt 160 lb 8 oz (72.8 kg)   BMI 25.14 kg/m  Constitutional: Well-developed and well-nourished. No distress. HEENT: Normocephalic and atraumatic. Oropharynx is clear and moist. Conjunctivae are normal.  No scleral icterus. Neck: Neck supple. Trachea midline. Cardiovascular: Normal rate, regular rhythm and intact distal pulses. No M/R/G Pulmonary/chest: Effort normal and breath sounds normal. No wheezing, rales or rhonchi. Abdominal: Soft, nontender, nondistended. Bowel sounds active throughout. Ileostomy/ileal conduit present in the right midabdomen which appears unremarkable Extremities: no clubbing, cyanosis, or edema Neurological: Alert and oriented to person place and time. Skin: Skin is warm and dry.  Psychiatric: Normal mood and affect. Behavior is normal.  RELEVANT LABS AND IMAGING: CBC    Component Value Date/Time   WBC 17.8 (H) 10/29/2016 1758   RBC 4.13 (L) 10/29/2016 1758   HGB 11.4 (L) 10/29/2016 1758   HCT 35.2 (L) 10/29/2016 1758   PLT 530 (H) 10/29/2016 1758   MCV 85.2 10/29/2016 1758   MCH 27.6 10/29/2016 1758   MCHC 32.4 10/29/2016 1758   RDW 14.9 10/29/2016 1758   LYMPHSABS 1.9 10/29/2016 1758    MONOABS 1.3 (H) 10/29/2016 1758   EOSABS 0.2 10/29/2016 1758   BASOSABS 0.0 10/29/2016 1758    CMP     Component Value Date/Time   NA 140 10/29/2016 1758   K 3.8 10/29/2016 1758   CL 107 10/29/2016 1758   CO2 22 10/29/2016 1758   GLUCOSE 105 (H) 10/29/2016 1758   BUN 27 (H) 10/29/2016 1758   CREATININE 1.27 (H) 10/29/2016 1758   CALCIUM 9.2 10/29/2016 1758   PROT 7.7 10/29/2016 1758   ALBUMIN 3.3 (L) 10/29/2016 1758   AST 27 10/29/2016 1758   ALT 29 10/29/2016 1758   ALKPHOS 95 10/29/2016 1758   BILITOT 0.2 (L) 10/29/2016 1758   GFRNONAA 53 (L) 10/29/2016 1758   GFRAA >60 10/29/2016 1758    ASSESSMENT/PLAN: 76 year old male with a past medical history of bladder cancer status post robotic cystoprostatectomy with ileal conduit complicated by recurrent UTI and admission for urosepsis, sarcoidosis, arthritis, prediabetes and hyperlipidemia who seen in consultation at the request of [name] for blood in stool and change in bowel habit.  1. Change in bowel habits/2 episodes of blood in stool/diarrhea associated with antibiotics/constipation but not on antibiotics -- currently he is having diarrhea given recent antibiotic treatment. Without antibiotics he feels that he has a hard time initiating bowel movement. He has never had colonoscopy. Due to his change in bowel habits I have recommended colonoscopy. We discussed the risks, benefits and alternatives and he is agreeable and wishes to proceed. Prior to colonoscopy we will check a C. difficile test to exclude antibiotic associated infection. When he is off of antibiotics I have recommended he try Colace as previously recommended by primary care. He continues 100-200 mg daily at bedtime. Further recommendations based on findings a colonoscopy. He will be meeting with urology at [facility] on 11/14/2016 and I asked that he notify them of the upcoming colonoscopy. If they are opposed to proceeding with colonoscopy I asked that he let me know. He  voiced understanding.      NI:OEVOJ, Claudina Lick, Md  [place], [place] [address]

## 2016-11-09 DIAGNOSIS — C678 Malignant neoplasm of overlapping sites of bladder: Secondary | ICD-10-CM | POA: Diagnosis not present

## 2016-11-09 DIAGNOSIS — C679 Malignant neoplasm of bladder, unspecified: Secondary | ICD-10-CM | POA: Diagnosis not present

## 2016-11-10 ENCOUNTER — Other Ambulatory Visit: Payer: Medicare Other

## 2016-11-10 DIAGNOSIS — K921 Melena: Secondary | ICD-10-CM | POA: Diagnosis not present

## 2016-11-11 ENCOUNTER — Encounter (HOSPITAL_BASED_OUTPATIENT_CLINIC_OR_DEPARTMENT_OTHER): Payer: Self-pay

## 2016-11-11 ENCOUNTER — Inpatient Hospital Stay (HOSPITAL_BASED_OUTPATIENT_CLINIC_OR_DEPARTMENT_OTHER)
Admission: EM | Admit: 2016-11-11 | Discharge: 2016-11-18 | DRG: 690 | Disposition: A | Discharge status: Home Health | Payer: Medicare Other | Attending: Internal Medicine | Admitting: Internal Medicine

## 2016-11-11 DIAGNOSIS — B9620 Unspecified Escherichia coli [E. coli] as the cause of diseases classified elsewhere: Secondary | ICD-10-CM | POA: Diagnosis present

## 2016-11-11 DIAGNOSIS — E876 Hypokalemia: Secondary | ICD-10-CM | POA: Diagnosis present

## 2016-11-11 DIAGNOSIS — F329 Major depressive disorder, single episode, unspecified: Secondary | ICD-10-CM | POA: Diagnosis present

## 2016-11-11 DIAGNOSIS — Z8546 Personal history of malignant neoplasm of prostate: Secondary | ICD-10-CM | POA: Diagnosis not present

## 2016-11-11 DIAGNOSIS — N136 Pyonephrosis: Principal | ICD-10-CM | POA: Diagnosis present

## 2016-11-11 DIAGNOSIS — Z91041 Radiographic dye allergy status: Secondary | ICD-10-CM

## 2016-11-11 DIAGNOSIS — F32A Depression, unspecified: Secondary | ICD-10-CM | POA: Diagnosis present

## 2016-11-11 DIAGNOSIS — I5189 Other ill-defined heart diseases: Secondary | ICD-10-CM | POA: Diagnosis present

## 2016-11-11 DIAGNOSIS — Z8551 Personal history of malignant neoplasm of bladder: Secondary | ICD-10-CM

## 2016-11-11 DIAGNOSIS — E7800 Pure hypercholesterolemia, unspecified: Secondary | ICD-10-CM | POA: Diagnosis present

## 2016-11-11 DIAGNOSIS — H409 Unspecified glaucoma: Secondary | ICD-10-CM | POA: Diagnosis present

## 2016-11-11 DIAGNOSIS — E663 Overweight: Secondary | ICD-10-CM | POA: Diagnosis present

## 2016-11-11 DIAGNOSIS — Z85828 Personal history of other malignant neoplasm of skin: Secondary | ICD-10-CM

## 2016-11-11 DIAGNOSIS — I5032 Chronic diastolic (congestive) heart failure: Secondary | ICD-10-CM | POA: Diagnosis present

## 2016-11-11 DIAGNOSIS — R6510 Systemic inflammatory response syndrome (SIRS) of non-infectious origin without acute organ dysfunction: Secondary | ICD-10-CM | POA: Diagnosis present

## 2016-11-11 DIAGNOSIS — E440 Moderate protein-calorie malnutrition: Secondary | ICD-10-CM | POA: Diagnosis present

## 2016-11-11 DIAGNOSIS — R509 Fever, unspecified: Secondary | ICD-10-CM | POA: Diagnosis present

## 2016-11-11 DIAGNOSIS — R7303 Prediabetes: Secondary | ICD-10-CM | POA: Diagnosis present

## 2016-11-11 DIAGNOSIS — N1330 Unspecified hydronephrosis: Secondary | ICD-10-CM

## 2016-11-11 DIAGNOSIS — Z9079 Acquired absence of other genital organ(s): Secondary | ICD-10-CM

## 2016-11-11 DIAGNOSIS — Z91013 Allergy to seafood: Secondary | ICD-10-CM

## 2016-11-11 DIAGNOSIS — A419 Sepsis, unspecified organism: Secondary | ICD-10-CM | POA: Diagnosis present

## 2016-11-11 DIAGNOSIS — E785 Hyperlipidemia, unspecified: Secondary | ICD-10-CM | POA: Diagnosis present

## 2016-11-11 DIAGNOSIS — Z823 Family history of stroke: Secondary | ICD-10-CM

## 2016-11-11 DIAGNOSIS — Z906 Acquired absence of other parts of urinary tract: Secondary | ICD-10-CM

## 2016-11-11 DIAGNOSIS — Z87440 Personal history of urinary (tract) infections: Secondary | ICD-10-CM

## 2016-11-11 DIAGNOSIS — Z8249 Family history of ischemic heart disease and other diseases of the circulatory system: Secondary | ICD-10-CM

## 2016-11-11 DIAGNOSIS — N400 Enlarged prostate without lower urinary tract symptoms: Secondary | ICD-10-CM | POA: Diagnosis present

## 2016-11-11 DIAGNOSIS — N390 Urinary tract infection, site not specified: Secondary | ICD-10-CM | POA: Diagnosis present

## 2016-11-11 DIAGNOSIS — J9811 Atelectasis: Secondary | ICD-10-CM | POA: Diagnosis not present

## 2016-11-11 DIAGNOSIS — Z936 Other artificial openings of urinary tract status: Secondary | ICD-10-CM

## 2016-11-11 DIAGNOSIS — Z6825 Body mass index (BMI) 25.0-25.9, adult: Secondary | ICD-10-CM

## 2016-11-11 DIAGNOSIS — R197 Diarrhea, unspecified: Secondary | ICD-10-CM | POA: Diagnosis not present

## 2016-11-11 DIAGNOSIS — D869 Sarcoidosis, unspecified: Secondary | ICD-10-CM | POA: Diagnosis present

## 2016-11-11 DIAGNOSIS — K219 Gastro-esophageal reflux disease without esophagitis: Secondary | ICD-10-CM | POA: Diagnosis present

## 2016-11-11 NOTE — ED Triage Notes (Signed)
C/o bilat flank pain, fever "for months"-feeling pain again today-NAD-presents to triage in w/c

## 2016-11-12 ENCOUNTER — Encounter (HOSPITAL_BASED_OUTPATIENT_CLINIC_OR_DEPARTMENT_OTHER): Payer: Self-pay | Admitting: Emergency Medicine

## 2016-11-12 ENCOUNTER — Emergency Department (HOSPITAL_BASED_OUTPATIENT_CLINIC_OR_DEPARTMENT_OTHER): Payer: Medicare Other

## 2016-11-12 DIAGNOSIS — I5032 Chronic diastolic (congestive) heart failure: Secondary | ICD-10-CM

## 2016-11-12 DIAGNOSIS — I5189 Other ill-defined heart diseases: Secondary | ICD-10-CM | POA: Diagnosis present

## 2016-11-12 DIAGNOSIS — R509 Fever, unspecified: Secondary | ICD-10-CM

## 2016-11-12 DIAGNOSIS — E440 Moderate protein-calorie malnutrition: Secondary | ICD-10-CM | POA: Diagnosis not present

## 2016-11-12 DIAGNOSIS — A419 Sepsis, unspecified organism: Secondary | ICD-10-CM

## 2016-11-12 DIAGNOSIS — R6510 Systemic inflammatory response syndrome (SIRS) of non-infectious origin without acute organ dysfunction: Secondary | ICD-10-CM

## 2016-11-12 DIAGNOSIS — J9811 Atelectasis: Secondary | ICD-10-CM | POA: Diagnosis not present

## 2016-11-12 LAB — CREATININE, SERUM
CREATININE: 1.23 mg/dL (ref 0.61–1.24)
GFR calc Af Amer: >60 mL/min (ref >60)
GFR calc non Af Amer: 55 mL/min — ABNORMAL LOW (ref >60)

## 2016-11-12 LAB — CBC WITH DIFFERENTIAL/PLATELET
BASOS ABS: 0 10*3/uL (ref 0.0–0.1)
BASOS PCT: 0 %
EOS PCT: 1 %
Eosinophils Absolute: 0.1 10*3/uL (ref 0.0–0.7)
HEMATOCRIT: 31.4 % — AB (ref 39.0–52.0)
Hemoglobin: 10.2 g/dL — ABNORMAL LOW (ref 13.0–17.0)
Lymphocytes Relative: 9 %
Lymphs Abs: 1.3 10*3/uL (ref 0.7–4.0)
MCH: 27.6 pg (ref 26.0–34.0)
MCHC: 32.5 g/dL (ref 30.0–36.0)
MCV: 85.1 fL (ref 78.0–100.0)
MONO ABS: 1.2 10*3/uL — AB (ref 0.1–1.0)
Monocytes Relative: 9 %
NEUTROS ABS: 11.3 10*3/uL — AB (ref 1.7–7.7)
Neutrophils Relative %: 81 %
PLATELETS: 502 10*3/uL — AB (ref 150–400)
RBC: 3.69 MIL/uL — ABNORMAL LOW (ref 4.22–5.81)
RDW: 14.4 % (ref 11.5–15.5)
WBC: 14 10*3/uL — AB (ref 4.0–10.5)

## 2016-11-12 LAB — COMPREHENSIVE METABOLIC PANEL
ALBUMIN: 2.9 g/dL — AB (ref 3.5–5.0)
ALT: 29 U/L (ref 17–63)
AST: 21 U/L (ref 15–41)
Alkaline Phosphatase: 114 U/L (ref 38–126)
Anion gap: 7 (ref 5–15)
BUN: 23 mg/dL — AB (ref 6–20)
CHLORIDE: 107 mmol/L (ref 101–111)
CO2: 21 mmol/L — AB (ref 22–32)
Calcium: 8.9 mg/dL (ref 8.9–10.3)
Creatinine, Ser: 1.13 mg/dL (ref 0.61–1.24)
GFR calc Af Amer: >60 mL/min (ref >60)
GFR calc non Af Amer: >60 mL/min (ref >60)
GLUCOSE: 125 mg/dL — AB (ref 65–99)
POTASSIUM: 3.6 mmol/L (ref 3.5–5.1)
Sodium: 135 mmol/L (ref 135–145)
Total Bilirubin: 0.5 mg/dL (ref 0.3–1.2)
Total Protein: 7.2 g/dL (ref 6.5–8.1)

## 2016-11-12 LAB — URINALYSIS, ROUTINE W REFLEX MICROSCOPIC
Bilirubin Urine: NEGATIVE
GLUCOSE, UA: NEGATIVE mg/dL
KETONES UR: NEGATIVE mg/dL
Nitrite: NEGATIVE
PROTEIN: NEGATIVE mg/dL
Specific Gravity, Urine: 1.015 (ref 1.005–1.030)
pH: 6 (ref 5.0–8.0)

## 2016-11-12 LAB — CBC
HCT: 30.7 % — ABNORMAL LOW (ref 39.0–52.0)
Hemoglobin: 10.2 g/dL — ABNORMAL LOW (ref 13.0–17.0)
MCH: 27.4 pg (ref 26.0–34.0)
MCHC: 33.2 g/dL (ref 30.0–36.0)
MCV: 82.5 fL (ref 78.0–100.0)
PLATELETS: 416 10*3/uL — AB (ref 150–400)
RBC: 3.72 MIL/uL — ABNORMAL LOW (ref 4.22–5.81)
RDW: 14.5 % (ref 11.5–15.5)
WBC: 17.2 10*3/uL — AB (ref 4.0–10.5)

## 2016-11-12 LAB — URINALYSIS, MICROSCOPIC (REFLEX)

## 2016-11-12 LAB — I-STAT CG4 LACTIC ACID, ED
LACTIC ACID, VENOUS: 0.5 mmol/L (ref 0.5–1.9)
Lactic Acid, Venous: 1.59 mmol/L (ref 0.5–1.9)

## 2016-11-12 LAB — LACTIC ACID, PLASMA: Lactic Acid, Venous: 1.3 mmol/L (ref 0.5–1.9)

## 2016-11-12 MED ORDER — VENLAFAXINE HCL 75 MG PO TABS
75.0000 mg | ORAL_TABLET | Freq: Every day | ORAL | Status: DC
  Administered 2016-11-12 – 2016-11-18 (×7): 75 mg via ORAL
  Filled 2016-11-12 (×7): qty 1

## 2016-11-12 MED ORDER — FLUCONAZOLE 100MG IVPB
100.0000 mg | INTRAVENOUS | Status: DC
  Filled 2016-11-12: qty 50

## 2016-11-12 MED ORDER — ALBUTEROL SULFATE HFA 108 (90 BASE) MCG/ACT IN AERS: 2.0000 | INHALATION_SPRAY | Freq: Four times a day (QID) | RESPIRATORY_TRACT | Status: DC | PRN

## 2016-11-12 MED ORDER — METHENAMINE MANDELATE 0.5 G PO TABS
1000.0000 mg | ORAL_TABLET | Freq: Two times a day (BID) | ORAL | Status: DC
Start: 2016-11-12 — End: 2016-11-18
  Administered 2016-11-12 – 2016-11-18 (×13): 1000 mg via ORAL
  Filled 2016-11-12 (×13): qty 2

## 2016-11-12 MED ORDER — SODIUM CHLORIDE 0.9 % IV SOLN
1.0000 g | Freq: Three times a day (TID) | INTRAVENOUS | Status: DC
  Administered 2016-11-12: 1 g via INTRAVENOUS
  Filled 2016-11-12 (×2): qty 1

## 2016-11-12 MED ORDER — SODIUM CHLORIDE 0.9 % IV SOLN
INTRAVENOUS | Status: DC
  Administered 2016-11-12 – 2016-11-18 (×6): via INTRAVENOUS

## 2016-11-12 MED ORDER — ENSURE ENLIVE PO LIQD
237.0000 mL | Freq: Two times a day (BID) | ORAL | Status: DC
  Administered 2016-11-12: 237 mL via ORAL

## 2016-11-12 MED ORDER — ONDANSETRON HCL 4 MG/2ML IJ SOLN: 4.0000 mg | Freq: Four times a day (QID) | INTRAMUSCULAR | Status: DC | PRN

## 2016-11-12 MED ORDER — ACETAMINOPHEN 325 MG PO TABS
650.0000 mg | ORAL_TABLET | Freq: Four times a day (QID) | ORAL | Status: DC | PRN
  Administered 2016-11-12: 650 mg via ORAL
  Filled 2016-11-12: qty 2

## 2016-11-12 MED ORDER — ENOXAPARIN SODIUM 40 MG/0.4ML ~~LOC~~ SOLN
40.0000 mg | Freq: Every day | SUBCUTANEOUS | Status: DC
  Administered 2016-11-12 – 2016-11-16 (×5): 40 mg via SUBCUTANEOUS
  Filled 2016-11-12 (×5): qty 0.4

## 2016-11-12 MED ORDER — PREMIER PROTEIN SHAKE
11.0000 [oz_av] | ORAL | Status: DC
  Administered 2016-11-12 – 2016-11-16 (×5): 11 [oz_av] via ORAL
  Filled 2016-11-12 (×7): qty 325.31

## 2016-11-12 MED ORDER — VANCOMYCIN HCL IN DEXTROSE 1-5 GM/200ML-% IV SOLN
1000.0000 mg | Freq: Once | INTRAVENOUS | Status: AC
  Administered 2016-11-12: 1000 mg via INTRAVENOUS
  Filled 2016-11-12: qty 200

## 2016-11-12 MED ORDER — FUROSEMIDE 10 MG/ML IJ SOLN
20.0000 mg | Freq: Two times a day (BID) | INTRAMUSCULAR | Status: DC
  Administered 2016-11-12 (×2): 20 mg via INTRAVENOUS
  Filled 2016-11-12 (×2): qty 2

## 2016-11-12 MED ORDER — PIPERACILLIN-TAZOBACTAM 3.375 G IVPB 30 MIN
3.3750 g | Freq: Once | INTRAVENOUS | Status: AC
  Administered 2016-11-12: 3.375 g via INTRAVENOUS
  Filled 2016-11-12 (×2): qty 50

## 2016-11-12 MED ORDER — MOMETASONE FURO-FORMOTEROL FUM 200-5 MCG/ACT IN AERO
2.0000 | INHALATION_SPRAY | Freq: Two times a day (BID) | RESPIRATORY_TRACT | Status: DC
  Administered 2016-11-12 – 2016-11-18 (×13): 2 via RESPIRATORY_TRACT
  Filled 2016-11-12: qty 8.8

## 2016-11-12 MED ORDER — FLUCONAZOLE IN SODIUM CHLORIDE 200-0.9 MG/100ML-% IV SOLN
200.0000 mg | INTRAVENOUS | Status: DC
  Administered 2016-11-12: 200 mg via INTRAVENOUS
  Filled 2016-11-12 (×2): qty 100

## 2016-11-12 MED ORDER — ACETAMINOPHEN 500 MG PO TABS
1000.0000 mg | ORAL_TABLET | Freq: Once | ORAL | Status: AC
  Administered 2016-11-12: 1000 mg via ORAL
  Filled 2016-11-12: qty 2

## 2016-11-12 MED ORDER — FLUTICASONE PROPIONATE 50 MCG/ACT NA SUSP
1.0000 | Freq: Every day | NASAL | Status: DC
  Administered 2016-11-12 – 2016-11-17 (×7): 1 via NASAL
  Filled 2016-11-12: qty 16

## 2016-11-12 MED ORDER — FLUCONAZOLE IN SODIUM CHLORIDE 100-0.9 MG/50ML-% IV SOLN
100.0000 mg | INTRAVENOUS | Status: DC
  Filled 2016-11-12: qty 50

## 2016-11-12 MED ORDER — MAGNESIUM OXIDE 400 (241.3 MG) MG PO TABS
400.0000 mg | ORAL_TABLET | Freq: Every day | ORAL | Status: DC
  Administered 2016-11-12 – 2016-11-18 (×7): 400 mg via ORAL
  Filled 2016-11-12 (×7): qty 1

## 2016-11-12 MED ORDER — TRIAMCINOLONE ACETONIDE 55 MCG/ACT NA AERO: 1.0000 | INHALATION_SPRAY | Freq: Every day | NASAL | Status: DC

## 2016-11-12 MED ORDER — SACCHAROMYCES BOULARDII 250 MG PO CAPS
250.0000 mg | ORAL_CAPSULE | Freq: Two times a day (BID) | ORAL | Status: DC
  Administered 2016-11-12 (×2): 250 mg via ORAL
  Filled 2016-11-12 (×2): qty 1

## 2016-11-12 MED ORDER — MONTELUKAST SODIUM 10 MG PO TABS
10.0000 mg | ORAL_TABLET | Freq: Every day | ORAL | Status: DC
  Administered 2016-11-12 – 2016-11-18 (×7): 10 mg via ORAL
  Filled 2016-11-12 (×7): qty 1

## 2016-11-12 MED ORDER — SODIUM CHLORIDE 0.9 % IV BOLUS (SEPSIS)
2000.0000 mL | Freq: Once | INTRAVENOUS | Status: AC
  Administered 2016-11-12: 2000 mL via INTRAVENOUS

## 2016-11-12 MED ORDER — FAMOTIDINE 20 MG PO TABS
10.0000 mg | ORAL_TABLET | Freq: Every day | ORAL | Status: DC
  Administered 2016-11-12 – 2016-11-18 (×7): 10 mg via ORAL
  Filled 2016-11-12 (×7): qty 1

## 2016-11-12 MED ORDER — ALBUTEROL SULFATE (2.5 MG/3ML) 0.083% IN NEBU: 2.5000 mg | INHALATION_SOLUTION | Freq: Four times a day (QID) | RESPIRATORY_TRACT | Status: DC | PRN

## 2016-11-12 NOTE — H&P (Signed)
History and Physical  [name] [name] DOB: 09-07-1940 DOA: 11/11/2016  Referring physician: April Palumbi, ER physician  PCP: [name], MD  Outpatient Specialists: Zenovia Jarred, GI, Lamonte Richer, Urology & Casandra Doffing, Cardiology Patient coming from: Home & is able to ambulate without assistance  Chief Complaint: Fever and malaise   HPI: Adrian Rios is a 76 y.o. 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 Patient was recently discharged 2 weeks prior from the hospitalist service for similar. In June, patient had a urinary tract infection which grew out ESBL E.Coli.  Urine cultures from the recent hospitalization 2 weeks ago gram multiple colonies unable to be classified as well as yeast. Patient was treated as if he had ESBL Escherichia coli and discharged on Cipro of which she completed his full course.  Since then, patient has been doing okay at home. His stools have been quite loose. He says it is been on antibiotics intermittently for the past 6 months and so at times they are quite watery. He saw GI as an outpatient a week ago and at that time there was consideration for colonoscopy, but GI raised the possibility of C. difficile infection and wanted him tested for this first. (Unable to find records if this was done.) Patient started to experience fever and malaise last night so he came into the emergency room because he was worried about recurrence of infection again.  ED Course: In the emergency room, patient noted to have fever of 102 and tachycardic in the 110's. Chest x-ray unrevealing. Urinalysis also unrevealing with only 6-30 white cells and rare bacteria. Nitrite negative. Patient had a white count of 14, however this was down from 17.8 2 weeks ago. Other labs unremarkable. Creatinine at baseline. No electrolyte abnormalities. Initial  lactic acid level 0.5 although rechecked 2 hours later and at that time 1.6. Patient blood cultures drawn.  He was given a dose of IV vancomycin and Zosyn for presumed sepsis. Is also given an IV fluid bolus. Hospitalists were called for further evaluation and admission  Review of Systems: Patient seen after arrival to floor . Pt complains of generalized weakness, although better after he got IV fluids. He otherwise has no complaints. Does not really experience dysuria since ileal conduit in place. The only other thing he notices is that the last few months, he does get some dyspnea with exertion, such as walking up the stairs.  Pt denies any headache, vision changes, dysphagia, chest pain, palpitations, shortness of breath, wheeze, cough, abdominal pain, hematuria, dysuria, constipation, focal extremity numbness weakness or pain .  Review of systems are otherwise negative   Past Medical History:  Diagnosis Date  . Allergic rhinitis   . Arthritis   . Benign localized prostatic hyperplasia with lower urinary tract symptoms (LUTS)   . Bladder tumor   . Borderline glaucoma of right eye   . Cancer [facility])    bladder and prostate  . Carotid bruit    per duplex 03-11-2016 RICA 1-39%  . Chronic throat clearing   . DDD (degenerative disc disease), lumbar   . Depression   . ED (erectile dysfunction)   . Elevated PSA    urologist-  dr [name]--- s/p  prostate bx's  . GERD (gastroesophageal reflux disease)   . Hematuria   . History of chronic bronchitis   . 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 History of squamous cell carcinoma excision    several times  . Hyperlipidemia   . Pre-diabetes   . Premature ventricular contractions (PVCs) (VPCs)   . RAD (reactive airway disease)   . 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 Sensorineural hearing loss (SNHL) of both ears   . Sepsis  ([place]) 09/2016  . UTI (urinary tract infection) 08/2016  . Wears glasses   . Wears hearing aid    bilateral   Past Surgical History:  Procedure Laterality Date  . APPENDECTOMY  2008  . CARDIOVASCULAR STRESS TEST  05/27/2010   normal nuclear study w/ no ischemia/  normal LV function and wall motion , ef 60%  . CYSTOSCOPY WITH INJECTION N/A 06/12/2016   Procedure: CYSTOSCOPY WITH INJECTION OF INDOCYANINE GREEN DYE;  Surgeon: Alexis Frock, MD;  Location: [facility];  Service: Urology;  Laterality: N/A;  . DEEP NECK LYMPH NODE BIOPSY / EXCISION  1970's   and Bronchoscopy w/ bx's ( dx Sarcoidosis)  . ILEOSTOMY    . MOHS SURGERY  2013 approx.    nasal; pre melanoma  . PARS PLANA VITRECTOMY Right 2007   repair macular pucker  . TONSILLECTOMY AND ADENOIDECTOMY  child  . TRANSURETHRAL RESECTION OF BLADDER TUMOR N/A 04/06/2016   Procedure: CYSTOSCOPY TRANSURETHRAL RESECTION OF BLADDER TUMORS (TURBT);  Surgeon: Carolan Clines, MD;  Location: [facility];  Service: Urology;  Laterality: N/A;  . TRANSURETHRAL RESECTION OF PROSTATE      Social History:  reports that he has never smoked. He has never used smokeless tobacco. He reports that he does not drink alcohol or use drugs. Patient was at home with his wife, ambulates without assistance.   Allergies  Allergen Reactions  . Ivp Dye [Iodinated Diagnostic Agents] Shortness Of Breath, Itching and Palpitations    "eyes itching,  Heart racing,  Effected breathing" 10-27-16 pt with 13 hr pre-meds for CT without any reaction-kj  . Shellfish Allergy Hives, Shortness Of Breath and Itching    Mostly crab  . Gabapentin Other (See Comments)    REACTION: dizziness and flushing  . Niacin-Lovastatin Er Other (See Comments)    Did not feel good on medication  . Adhesive [Tape] Rash    Use paper tape    Family History  Problem Relation Age of Onset  . Stroke Mother        in her 47s  . Breast cancer Sister   . Heart disease Maternal  Grandmother 84       MI   . Breast cancer Paternal Grandmother   . Heart disease Paternal Grandfather 64       MI  . Stroke Brother   . Diabetes Neg Hx       Prior to Admission medications   Medication Sig Start Date End Date Taking? Authorizing Provider  ADVAIR DISKUS 250-50 MCG/DOSE AEPB INHALE 1 DOSE BY MOUTH EVERY 12 HOURS 08/21/16  Yes Burns, Claudina Lick, MD  albuterol (PROVENTIL HFA;VENTOLIN HFA) 108 (90 Base) MCG/ACT inhaler Inhale 2 puffs into the lungs every 6 (six) hours as needed for wheezing or shortness of breath. 08/27/15  Yes Burns, Claudina Lick, MD  docusate sodium (COLACE) 100 MG capsule Take 1 capsule (100 mg total) by mouth daily as needed for mild constipation. 10/28/16  Yes Lavina Hamman, MD  feeding supplement,  ENSURE ENLIVE, (ENSURE ENLIVE) LIQD Take 237 mLs by mouth 2 (two) times daily between meals. 10/28/16  Yes Lavina Hamman, MD  magnesium oxide (MAG-OX) 400 MG tablet Take 400 mg by mouth daily.   Yes [provider]  methenamine (MANDELAMINE) 1 g tablet Take 1 tablet (1,000 mg total) by mouth 2 (two) times daily. 10/28/16  Yes Lavina Hamman, MD  Misc Natural Products (GLUCOSAMINE CHOND DOUBLE STR PO) Take 1 tablet by mouth 2 (two) times daily.   Yes [provider]  montelukast (SINGULAIR) 10 MG tablet TAKE ONE TABLET BY MOUTH ONCE DAILY 08/21/16  Yes Burns, Claudina Lick, MD  ranitidine (ZANTAC) 150 MG tablet TAKE ONE TABLET BY MOUTH AT BEDTIME 07/01/16  Yes Burns, Claudina Lick, MD  saccharomyces boulardii (FLORASTOR) 250 MG capsule Take 1 capsule (250 mg total) by mouth 2 (two) times daily. 10/28/16 11/17/16 Yes Lavina Hamman, MD  triamcinolone (NASACORT ALLERGY 24HR) 55 MCG/ACT AERO nasal inhaler Place 1 spray into the nose at bedtime.   Yes [provider]  venlafaxine (EFFEXOR) 75 MG tablet TAKE 1 TABLET BY MOUTH ONCE DAILY 09/28/16  Yes Burns, Claudina Lick, MD    Physical Exam: BP 124/70 (BP Location: Left Arm)   Pulse (!) 114   Temp 98.3 F (36.8 C)  (Oral)   Resp 18   Ht 5\' 7"  (1.702 m)   Wt 72.6 kg (160 lb)   SpO2 95%   BMI 25.06 kg/m   General:  Alert and oriented 3, no acute distress  Eyes: Sclera nonicteric, extraocular movements are intact  ENT: Normocephalic and atraumatic, mucous membranes are moist  Neck: Supple, no JVD  Cardiovascular: Regular rate and rhythm, S1-S2  Respiratory: Clear to auscultation bilaterally, no wheezes or crackles  Abdomen: Soft, nontender, nondistended, hypoactive bowel sounds. Noted ileal conduit in the right of umbilicus  Skin: No skin breaks, tears or lesions  Musculoskeletal: No clubbing or cyanosis or edema  Psychiatric: Patient is appropriate, no evidence of psychoses  Neurologic: No focal deficits           Labs on Admission:  Basic Metabolic Panel:  Recent Labs Lab 11/12/16 0008  NA 135  K 3.6  CL 107  CO2 21*  GLUCOSE 125*  BUN 23*  CREATININE 1.13  CALCIUM 8.9   Liver Function Tests:  Recent Labs Lab 11/12/16 0008  AST 21  ALT 29  ALKPHOS 114  BILITOT 0.5  PROT 7.2  ALBUMIN 2.9*   No results for input(s): LIPASE, AMYLASE in the last 168 hours. No results for input(s): AMMONIA in the last 168 hours. CBC:  Recent Labs Lab 11/12/16 0008  WBC 14.0*  NEUTROABS 11.3*  HGB 10.2*  HCT 31.4*  MCV 85.1  PLT 502*   Cardiac Enzymes: No results for input(s): CKTOTAL, CKMB, CKMBINDEX, TROPONINI in the last 168 hours.  BNP (last 3 results)  Recent Labs  10/25/16 1937  BNP 51.7    ProBNP (last 3 results) No results for input(s): PROBNP in the last 8760 hours.  CBG: No results for input(s): GLUCAP in the last 168 hours.  Radiological Exams on Admission: Dg Chest 2 View  Result Date: 11/12/2016 CLINICAL DATA:  Sepsis.  Fever.  Dizziness, onset yesterday. EXAM: CHEST  2 VIEW COMPARISON:  Radiographs and CT 10/25/2016 FINDINGS: The cardiomediastinal contours are unchanged. Bibasilar atelectasis or scarring, similar to prior exam. Pulmonary vasculature is  normal. No consolidation, pleural effusion, or pneumothorax. No acute osseous abnormalities are seen. IMPRESSION: Bibasilar  atelectasis or scarring, unchanged from prior exam. No acute abnormality. Electronically Signed   By: Jeb Levering M.D.   On: 11/12/2016 02:08    EKG: Independently reviewed.Sinus tachycardia   Assessment/Plan Present on Admission: . SIRS (systemic inflammatory response syndrome) (HCC)/fever felt to be secondary to fungal UTI: In review of previous records, looks like previous UTI was already treated. Unit last hospitalization had ESBL, he did complete full course and previous microbiology noted sensitivity Cipro. However without 60,000 colonies of yeast 90 to see treatment given for that. Have started IV Diflucan 200 daily. Patient will likely need a full seven-day course if this is fever cause. Patient is criteria for SIRS given tachycardia and fever, but not sepsis given normal lactic acid level, normal white count and no end organ damage Other sources of fever-no pulmonary findings or complaints. Suspect dyspnea on exertion secondary to heart failure. See below. Given complaints GI plus intermittent antibiotics over the last 6 months, we'll check C. difficile culture.  Chronic diastolic heart failure: Echocardiogram done 10/27/16 the grade 1 diastolic dysfunction. Patient unaware of this diagnosis. He does report some dyspnea on exertion which is new over the last 6 months. Certainly could be deconditioning, however he has been treated multiple times with aggressive fluid resuscitation. His weight on admission was 160 pounds, however weight assessment difficult to check from patient's baseline given that he lost about 20-25 pounds following cancer diagnosis and secondary surgery. Given normal creatinine and normal lactic acid level, will stop IV fluids and start gentle IV Lasix. Daily weights and strict input/output.  . Sarcoidosis ([place]): Stable . Glaucoma: Continue drops .  Overweight (BMI 25.0-29.9): Meets criteria with BMI greater than 25 . Bladder cancer ([place]) status post ileal conduit . Malnutrition of moderate degree: Will ask nutrition to see  Active Problems:   Sarcoidosis ([place])   Glaucoma   Overweight (BMI 25.0-29.9)   Fever   Bladder cancer (HCC)   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HCC)   SIRS (systemic inflammatory response syndrome) (HCC)   Malnutrition of moderate degree   DVT prophylaxis: Lovenox   Code Status: Full code as per patient   Family Communication: Daughter and daughter-in-law at the bedside   Disposition Plan: Potential discharge tomorrow if patient feeling better, afebrile 24 hours   Consults called: None   Admission status: Given potential for discharge, placed under observation     Annita Brod MD [facility] Pager [phone]  If 7PM-7AM, please contact night-coverage [email or web address] Password [facility]  11/12/2016, 10:33 AM

## 2016-11-12 NOTE — Progress Notes (Signed)
76 yo M with sarcoidosis, pros Ca, urostomy in place (followed by Dr. Tresa Moore), hx of ESBL who presents with fever chills again.  Patient recently admitted for urosepsis, treated with Meropenem to Cipro/methenamine.  Has been on Keflex, Cipro and methenamine since discharge.  BP 112/68   Pulse (!) 108   Temp 100 F (37.8 C) (Oral)   Resp (!) 25   Ht 5\' 7"  (1.702 m)   Wt 72.6 kg (160 lb)   SpO2 94%   BMI 25.06 kg/m   -Na 135, K 3.6, Cr 1.1 (baseline 1.2), WBC 14K (down from 17K at discharge), Hgb 10.2 stable -Lactate 0.5 -CXR clear -Cultures of blood and urine were obtained and he was given vancomycin and Zosyn and [facility] were asked to accept for transfer

## 2016-11-12 NOTE — ED Provider Notes (Signed)
[facility] DEPT [facility] Provider Note   CSN: [id number] Arrival date & time: 11/11/16  2224     History   Chief Complaint Chief Complaint  Patient presents with  . Flank Pain    HPI Adrian Rios is a 76 y.o. male.  The history is provided by the patient and the spouse.  Fever   This is a recurrent problem. The current episode started 12 to 24 hours ago. The problem occurs constantly. The problem has not changed since onset.The maximum temperature noted was 102 to 102.9 F. Associated symptoms include diarrhea. Pertinent negatives include no vomiting and no cough. Associated symptoms comments: Flank pain dysuria. He has tried nothing for the symptoms. The treatment provided no relief.  On antibiotics for UTI and has been seen multiple times for UTI since his surgery in august.    Past Medical History:  Diagnosis Date  . Allergic rhinitis   . Arthritis   . Benign localized prostatic hyperplasia with lower urinary tract symptoms (LUTS)   . Bladder tumor   . Borderline glaucoma of right eye   . Cancer [facility])    bladder and prostate  . Carotid bruit    per duplex 03-11-2016 RICA 1-39%  . Chronic throat clearing   . DDD (degenerative disc disease), lumbar   . Depression   . ED (erectile dysfunction)   . Elevated PSA    urologist-  dr [name]--- s/p  prostate bx's  . GERD (gastroesophageal reflux disease)   . Hematuria   . History of chronic bronchitis   . 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 History of squamous cell carcinoma excision    several times  . Hyperlipidemia   . Pre-diabetes   . Premature ventricular contractions (PVCs) (VPCs)   . RAD (reactive airway disease)   . 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 Sensorineural hearing loss (SNHL) of both ears   . Sepsis ([place]) 09/2016  . UTI (urinary tract infection) 08/2016  . Wears glasses   . Wears  hearing aid    bilateral    Patient Active Problem List   Diagnosis Date Noted  . UTI (urinary tract infection) 10/26/2016  . AKI (acute kidney injury) ([place]) 10/26/2016  . Complicated UTI (urinary tract infection) 10/26/2016  . Fatigue 08/19/2016  . Malnutrition of moderate degree 08/05/2016  . Sepsis ([place]) 08/04/2016  . History of bladder cancer 06/18/2016  . History of prostate cancer 06/18/2016  . 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 Bladder cancer ([place]) 06/11/2016  . Fever 05/16/2016  . Carotid bruit 02/17/2016  . Prediabetes 08/20/2015  . Overweight (BMI 25.0-29.9) 08/20/2015  . Esophageal reflux 08/20/2015  . Glaucoma 02/14/2015  . Varicose veins of lower extremities with other complications 54/62/7035  . RAD (reactive airway disease) 10/27/2011  . 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[place]), 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  . Anemia, unspecified 10/27/2011  . 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 ERECTILE DYSFUNCTION 04/23/2010  . [place] DISEASE, LUMBOSACRAL SPINE 02/06/2010  . 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 Depression 04/12/2008  . Allergic rhinitis 04/12/2008  . ELEVATED PROSTATE SPECIFIC ANTIGEN 04/12/2008  . Hyperlipidemia 10/05/2006  . Sarcoidosis ([place]) 07/05/2006    Past Surgical History:  Procedure Laterality Date  . APPENDECTOMY  2008  . CARDIOVASCULAR STRESS TEST  05/27/2010   normal nuclear study w/ no ischemia/  normal LV  function and wall motion , ef 60%  . CYSTOSCOPY WITH INJECTION N/A 06/12/2016   Procedure: CYSTOSCOPY WITH INJECTION OF INDOCYANINE GREEN DYE;  Surgeon: Alexis Frock, MD;  Location: [facility];  Service: Urology;  Laterality: N/A;  . DEEP NECK LYMPH NODE BIOPSY / EXCISION  1970's   and Bronchoscopy w/ bx's ( dx Sarcoidosis)  . ILEOSTOMY    . MOHS SURGERY  2013 approx.    nasal; pre melanoma  . PARS PLANA VITRECTOMY Right 2007   repair macular pucker  . TONSILLECTOMY AND ADENOIDECTOMY  child  . TRANSURETHRAL RESECTION OF BLADDER TUMOR N/A  04/06/2016   Procedure: CYSTOSCOPY TRANSURETHRAL RESECTION OF BLADDER TUMORS (TURBT);  Surgeon: Carolan Clines, MD;  Location: [facility];  Service: Urology;  Laterality: N/A;  . TRANSURETHRAL RESECTION OF PROSTATE         Home Medications    Prior to Admission medications   Medication Sig Start Date End Date Taking? Authorizing Provider  ADVAIR DISKUS 250-50 MCG/DOSE AEPB INHALE 1 DOSE BY MOUTH EVERY 12 HOURS 08/21/16   Burns, Claudina Lick, MD  albuterol (PROVENTIL HFA;VENTOLIN HFA) 108 (90 Base) MCG/ACT inhaler Inhale 2 puffs into the lungs every 6 (six) hours as needed for wheezing or shortness of breath. 08/27/15   Burns, Claudina Lick, MD  docusate sodium (COLACE) 100 MG capsule Take 1 capsule (100 mg total) by mouth daily as needed for mild constipation. 10/28/16   Lavina Hamman, MD  feeding supplement, ENSURE ENLIVE, (ENSURE ENLIVE) LIQD Take 237 mLs by mouth 2 (two) times daily between meals. 10/28/16   Lavina Hamman, MD  magnesium oxide (MAG-OX) 400 MG tablet Take 400 mg by mouth daily.    [provider]  methenamine (MANDELAMINE) 1 g tablet Take 1 tablet (1,000 mg total) by mouth 2 (two) times daily. 10/28/16   Lavina Hamman, MD  Misc Natural Products (GLUCOSAMINE CHOND DOUBLE STR PO) Take 1 tablet by mouth 2 (two) times daily.    [provider]  montelukast (SINGULAIR) 10 MG tablet TAKE ONE TABLET BY MOUTH ONCE DAILY 08/21/16   [name], MD  ranitidine (ZANTAC) 150 MG tablet TAKE ONE TABLET BY MOUTH AT BEDTIME 07/01/16   Burns, Claudina Lick, MD  saccharomyces boulardii (FLORASTOR) 250 MG capsule Take 1 capsule (250 mg total) by mouth 2 (two) times daily. 10/28/16 11/17/16  Lavina Hamman, MD  SUPREP BOWEL PREP KIT 17.5-3.13-1.6 GM/180ML SOLN Take 1 kit by mouth as directed. 11/04/16   Pyrtle, Lajuan Lines, MD  triamcinolone (NASACORT ALLERGY 24HR) 55 MCG/ACT AERO nasal inhaler Place 1 spray into the nose at bedtime.    [provider]  venlafaxine (EFFEXOR)  75 MG tablet TAKE 1 TABLET BY MOUTH ONCE DAILY 09/28/16   [name], MD    Family History Family History  Problem Relation Age of Onset  . Stroke Mother        in her 36s  . Breast cancer Sister   . Heart disease Maternal Grandmother 84       MI   . Breast cancer Paternal Grandmother   . Heart disease Paternal Grandfather 63       MI  . Stroke Brother   . Diabetes Neg Hx     Social History Social History  Substance Use Topics  . Smoking status: Never Smoker  . Smokeless tobacco: Never Used  . Alcohol use No     Allergies   Ivp dye [iodinated diagnostic agents]; Shellfish allergy;  Gabapentin; Niacin-lovastatin er; and Adhesive [tape]   Review of Systems Review of Systems  Constitutional: Positive for fever.  Respiratory: Negative for cough and shortness of breath.   Gastrointestinal: Positive for diarrhea. Negative for vomiting.  All other systems reviewed and are negative.    Physical Exam Updated Vital Signs BP 125/72   Pulse (!) 112   Temp (!) 101.1 F (38.4 C) (Oral)   Resp 16   Ht '5\' 7"'  (1.702 m)   Wt 72.6 kg (160 lb)   SpO2 93%   BMI 25.06 kg/m   Physical Exam  Constitutional: He is oriented to person, place, and time. He appears well-developed and well-nourished. No distress.  HENT:  Head: Normocephalic and atraumatic.  Nose: Nose normal.  Mouth/Throat: No oropharyngeal exudate.  Eyes: Pupils are equal, round, and reactive to light. Conjunctivae are normal.  Neck: Normal range of motion. Neck supple.  Cardiovascular: Normal rate, regular rhythm, normal heart sounds and intact distal pulses.   Pulmonary/Chest: Effort normal and breath sounds normal. He has no wheezes. He has no rales.  Abdominal: Soft. Bowel sounds are normal. He exhibits no mass. There is no tenderness. There is no rebound and no guarding.  Musculoskeletal: Normal range of motion.  Neurological: He is alert and oriented to person, place, and time.  Skin: Skin is warm and  dry. Capillary refill takes less than 2 seconds.  Psychiatric: He has a normal mood and affect.     ED Treatments / Results   Vitals:   11/12/16 0100 11/12/16 0220  BP:  124/80  Pulse:  (!) 108  Resp:  18  Temp: 100 F (37.8 C) 100.2 F (37.9 C)  SpO2:  93%   Labs (all labs ordered are listed, but only abnormal results are displayed)  Results for orders placed or performed during the hospital encounter of 11/11/16  Urinalysis, Routine w reflex microscopic  Result Value Ref Range   Color, Urine YELLOW YELLOW   APPearance CLOUDY (A) CLEAR   Specific Gravity, Urine 1.015 1.005 - 1.030   pH 6.0 5.0 - 8.0   Glucose, UA NEGATIVE NEGATIVE mg/dL   Hgb urine dipstick SMALL (A) NEGATIVE   Bilirubin Urine NEGATIVE NEGATIVE   Ketones, ur NEGATIVE NEGATIVE mg/dL   Protein, ur NEGATIVE NEGATIVE mg/dL   Nitrite NEGATIVE NEGATIVE   Leukocytes, UA MODERATE (A) NEGATIVE  CBC with Differential  Result Value Ref Range   WBC 14.0 (H) 4.0 - 10.5 K/uL   RBC 3.69 (L) 4.22 - 5.81 MIL/uL   Hemoglobin 10.2 (L) 13.0 - 17.0 g/dL   HCT 31.4 (L) 39.0 - 52.0 %   MCV 85.1 78.0 - 100.0 fL   MCH 27.6 26.0 - 34.0 pg   MCHC 32.5 30.0 - 36.0 g/dL   RDW 14.4 11.5 - 15.5 %   Platelets 502 (H) 150 - 400 K/uL   Neutrophils Relative % 81 %   Neutro Abs 11.3 (H) 1.7 - 7.7 K/uL   Lymphocytes Relative 9 %   Lymphs Abs 1.3 0.7 - 4.0 K/uL   Monocytes Relative 9 %   Monocytes Absolute 1.2 (H) 0.1 - 1.0 K/uL   Eosinophils Relative 1 %   Eosinophils Absolute 0.1 0.0 - 0.7 K/uL   Basophils Relative 0 %   Basophils Absolute 0.0 0.0 - 0.1 K/uL  Comprehensive metabolic panel  Result Value Ref Range   Sodium 135 135 - 145 mmol/L   Potassium 3.6 3.5 - 5.1 mmol/L   Chloride 107 101 -  111 mmol/L   CO2 21 (L) 22 - 32 mmol/L   Glucose, Bld 125 (H) 65 - 99 mg/dL   BUN 23 (H) 6 - 20 mg/dL   Creatinine, Ser 1.13 0.61 - 1.24 mg/dL   Calcium 8.9 8.9 - 10.3 mg/dL   Total Protein 7.2 6.5 - 8.1 g/dL   Albumin 2.9 (L)  3.5 - 5.0 g/dL   AST 21 15 - 41 U/L   ALT 29 17 - 63 U/L   Alkaline Phosphatase 114 38 - 126 U/L   Total Bilirubin 0.5 0.3 - 1.2 mg/dL   GFR calc non Af Amer >60 >60 mL/min   GFR calc Af Amer >60 >60 mL/min   Anion gap 7 5 - 15  Urinalysis, Microscopic (reflex)  Result Value Ref Range   RBC / HPF 0-5 0 - 5 RBC/hpf   WBC, UA 6-30 0 - 5 WBC/hpf   Bacteria, UA RARE (A) NONE SEEN   Squamous Epithelial / LPF 0-5 (A) NONE SEEN  I-Stat CG4 Lactic Acid, ED  (not at  [facility])  Result Value Ref Range   Lactic Acid, Venous 0.50 0.5 - 1.9 mmol/L   Dg Chest 2 View  Result Date: 11/12/2016 CLINICAL DATA:  Sepsis.  Fever.  Dizziness, onset yesterday. EXAM: CHEST  2 VIEW COMPARISON:  Radiographs and CT 10/25/2016 FINDINGS: The cardiomediastinal contours are unchanged. Bibasilar atelectasis or scarring, similar to prior exam. Pulmonary vasculature is normal. No consolidation, pleural effusion, or pneumothorax. No acute osseous abnormalities are seen. IMPRESSION: Bibasilar atelectasis or scarring, unchanged from prior exam. No acute abnormality. Electronically Signed   By: Jeb Levering M.D.   On: 11/12/2016 02:08   Dg Chest 2 View  Result Date: 10/25/2016 CLINICAL DATA:  Fevers EXAM: CHEST  2 VIEW COMPARISON:  09/08/2016 FINDINGS: The heart size and mediastinal contours are within normal limits. Both lungs are clear. The visualized skeletal structures are unremarkable. IMPRESSION: No active cardiopulmonary disease. Electronically Signed   By: Inez Catalina M.D.   On: 10/25/2016 20:45   Ct Chest Wo Contrast  Result Date: 10/25/2016 CLINICAL DATA:  Shortness of breath. Fever and urinary tract infection. History of sarcoidosis, bladder or prostate cancer. EXAM: CT CHEST WITHOUT CONTRAST TECHNIQUE: Multidetector CT imaging of the chest was performed following the standard protocol without IV contrast. COMPARISON:  Chest radiograph October 25, 2016 at 2018 hours FINDINGS: CARDIOVASCULAR: Heart size is normal.  Mild coronary artery calcifications. No pericardial effusion. Thoracic aorta is normal course and caliber, mild calcific atherosclerosis. LEFT vertebral artery arises directly from the aortic arch, normal variant. MEDIASTINUM/NODES: No mediastinal mass. No lymphadenopathy by CT size criteria though sensitivity decreased without oral contrast. Normal appearance of thoracic esophagus though not tailored for evaluation. LUNGS/PLEURA: Tracheobronchial tree is patent, no pneumothorax. No pleural effusions, focal consolidations, pulmonary nodules or masses. Mildly thickened RIGHT minor fissure. Minimal bibasilar atelectasis/ scarring. UPPER ABDOMEN: Nonacute. MUSCULOSKELETAL: Included soft tissues and included osseous structures appear normal. IMPRESSION: Minimal bibasilar atelectasis/ scarring. Aortic Atherosclerosis (ICD10-I70.0). Electronically Signed   By: Elon Alas M.D.   On: 10/25/2016 21:43   Ct Abdomen Pelvis W Contrast  Result Date: 10/27/2016 CLINICAL DATA:  Bladder carcinoma. Bladder ca protocol---13 hr pre-meds due to previous contrast allergy w/o reaction S/p cystoprostatectomy with ileal conduit 4/18 for bladder ca Recurrent, complicated UTI's EXAM: CT ABDOMEN AND PELVIS WITH CONTRAST TECHNIQUE: Multidetector CT imaging of the abdomen and pelvis was performed using the standard protocol following bolus administration of intravenous contrast. CONTRAST:  174m ISOVUE-300  IOPAMIDOL (ISOVUE-300) INJECTION 61% COMPARISON:  03/05/16 FINDINGS: Lower chest: Lung bases are clear. Hepatobiliary: No focal hepatic lesion. No biliary duct dilatation. Gallbladder is normal. Common bile duct is normal. Pancreas: Pancreas is normal. No ductal dilatation. No pancreatic inflammation. Spleen: Normal spleen Adrenals/urinary tract: Adrenal glands normal. No enhancing renal lesion. Mild hydronephrosis and hydroureter on the LEFT leading up to the ileal conduit. Mild hydroureter on the RIGHT without hydronephrosis.  Delayed imaging demonstrates contrast flowing into the ileal conduit through the LEFT and RIGHT ureter. Ileostomy exiting through the RIGHT abdominal wall midline appears normal. There are several small filling defects within ileal conduit several cm distal to the junction of the LEFT RIGHT ureter (image 63, series 8). Favor small amounts of debris or clot. Stomach/Bowel: Stomach and duodenum are normal. Small bowel small bowel anastomosis in the mid abdomen. Cecum is normal. Colon is normal. Several diverticular the descending colon rectum normal Vascular/Lymphatic: Abdominal aorta is normal caliber. There is no retroperitoneal or periportal lymphadenopathy. No pelvic lymphadenopathy. Reproductive: Post proctitis cystectomy Other: No free fluid. Musculoskeletal: No aggressive osseous lesion. IMPRESSION: 1. Ileal conduit with hydronephrosis and hydroureter of the LEFT renal collecting system. No evidence of obstruction of the conduit. 2. Several small filling defects within the ileal conduit are favored debris. 3. Incidental findings of atherosclerosis and diverticulosis. Electronically Signed   By: Suzy Bouchard M.D.   On: 10/27/2016 11:32    Procedures Procedures (including critical care time)  Medications Ordered in ED Medications  piperacillin-tazobactam (ZOSYN) IVPB 3.375 g (not administered)  vancomycin (VANCOCIN) IVPB 1000 mg/200 mL premix (not administered)  sodium chloride 0.9 % bolus 2,000 mL (not administered)      Final Clinical Impressions(s) / ED Diagnoses  Will admit for sepsis, case d/w Dr. Johnanna Schneiders, Ellizabeth Dacruz, MD 11/12/16 [phone]

## 2016-11-12 NOTE — Care Management Obs Status (Signed)
[facility] NOTIFICATION   Patient Details  Name: Adrian Rios MRN: [id number] Date of Birth: 1941/01/21   Medicare Observation Status Notification Given:  Yes    MahabirJuliann Pulse, RN 11/12/2016, 12:40 PM

## 2016-11-12 NOTE — Progress Notes (Signed)
Nutrition Brief Note  Patient identified on the Malnutrition Screening Tool (MST) Report and consult for assessment of nutrition requirements and status.   Wt Readings from Last 15 Encounters:  11/11/16 160 lb (72.6 kg)  11/04/16 160 lb 8 oz (72.8 kg)  10/29/16 165 lb (74.8 kg)  10/29/16 162 lb (73.5 kg)  10/26/16 162 lb 0.6 oz (73.5 kg)  10/19/16 162 lb 9.6 oz (73.8 kg)  10/10/16 160 lb (72.6 kg)  09/08/16 160 lb (72.6 kg)  08/19/16 159 lb (72.1 kg)  08/04/16 160 lb 15 oz (73 kg)  07/23/16 162 lb 3.2 oz (73.6 kg)  06/26/16 165 lb (74.8 kg)  06/23/16 165 lb 6.4 oz (75 kg)  06/11/16 173 lb 4.8 oz (78.6 kg)  05/16/16 174 lb 12.8 oz (79.3 kg)    Body mass index is 25.06 kg/m. Patient meets criteria for overweight based on current BMI, appropriate for age. Skin WDL. Pt reports that appetite was good/at baseline PTA but that he has been losing weight recently. He has been trying to focus on protein intake. Per review of weights above, it appears that pt weight has been stable since 07/23/16. Physical assessment shows no fat wasting and mild muscle wasting. Pt does not meet criteria for malnutrition at this time. He has hx of malnutrition dx but this has resolved as of this time.   Current diet order is Heart Healthy with no intakes documented. Pt reports that he does not like the food here. Pt was seen by another RD on 8/27 and at that time was recommended to consume small portions more frequently throughout the day. Ensure Enlive ordered BID earlier today but pt does not like Ensure or Boost supplements. Will trial Premier Protein once/day, this supplement provides 160 kcal and 30 grams of protein.   Medications reviewed; 10 mg oral Pepcid/day, 20 mg IV Lasix BID, 400 mg Mag-ox/day, 250 mg Florastor BID.  Labs reviewed.   No further nutrition interventions warranted at this time. If nutrition issues arise, please re-consult RD.      Jarome Matin, MS, RD, LDN, [facility] Inpatient Clinical  Dietitian Pager # [phone] After hours/weekend pager # [phone]

## 2016-11-12 NOTE — Evaluation (Signed)
Physical Therapy One Time Evaluation Patient Details Name: Adrian Rios MRN: [id number] DOB: 1940-05-07 Today's Date: 11/12/2016   History of Present Illness  76 y.o. male with medical history significant for prostate cancer status post resection with secondary ileal conduit who is had several admissions in the past 6 months for GI, SEPSIS and currently being treated for SIRS (systemic inflammatory response syndrome)/fever felt to be secondary to fungal UTI  Clinical Impression  Patient evaluated by Physical Therapy with no further acute PT needs identified. All education has been completed and the patient has no further questions.  Pt reports generalized weakness however tolerated good distance of hallway ambulation.  Pt states he has been noticing increased weakness and difficulty with performing a flight of stairs prior to admission.  Recommended HHPT and pt considering.  Pt eager for d/c home tomorrow. PT is signing off. Thank you for this referral.     Follow Up Recommendations Home health PT    Equipment Recommendations  Kasandra Knudsen (if pt does not have (states he has one somewhere))    Recommendations for Other Services       Precautions / Restrictions Precautions Precautions: None      Mobility  Bed Mobility Overal bed mobility: Modified Independent                Transfers Overall transfer level: Modified independent                  Ambulation/Gait Ambulation/Gait assistance: Supervision;Modified independent (Device/Increase time) Ambulation Distance (Feet): 400 Feet Assistive device: None Gait Pattern/deviations: Step-through pattern;Decreased stride length     General Gait Details: slow but steady pace, pt pushed IV pole, pt reports generalized weakness however has improved since admission  Stairs            Wheelchair Mobility    Modified Rankin (Stroke Patients Only)       Balance Overall balance assessment: Needs assistance          Standing balance support: No upper extremity supported Standing balance-Leahy Scale: Good                               Pertinent Vitals/Pain Pain Assessment: No/denies pain    Home Living Family/patient expects to be discharged to:: Private residence Living Arrangements: Spouse/significant other Available Help at Discharge: Family Type of Home: House Home Access: Ramped entrance     Home Layout: Two level;Able to live on main level with bedroom/bathroom Home Equipment: Kasandra Knudsen - single point      Prior Function Level of Independence: Independent               Hand Dominance        Extremity/Trunk Assessment        Lower Extremity Assessment Lower Extremity Assessment: Generalized weakness    Cervical / Trunk Assessment Cervical / Trunk Assessment: Normal  Communication   Communication: No difficulties  Cognition Arousal/Alertness: Awake/alert Behavior During Therapy: WFL for tasks assessed/performed Overall Cognitive Status: Within Functional Limits for tasks assessed                                        General Comments      Exercises     Assessment/Plan    PT Assessment All further PT needs can be met in the next venue of care  PT Problem List Decreased strength;Decreased mobility;Decreased activity tolerance       PT Treatment Interventions      PT Goals (Current goals can be found in the Care Plan section)  Acute Rehab PT Goals PT Goal Formulation: All assessment and education complete, DC therapy    Frequency     Barriers to discharge        Co-evaluation               AM-PAC PT "6 Clicks" Daily Activity  Outcome Measure Difficulty turning over in bed (including adjusting bedclothes, sheets and blankets)?: None Difficulty moving from lying on back to sitting on the side of the bed? : None Difficulty sitting down on and standing up from a chair with arms (e.g., wheelchair, bedside commode,  etc,.)?: None Help needed moving to and from a bed to chair (including a wheelchair)?: A Little Help needed walking in hospital room?: A Little Help needed climbing 3-5 steps with a railing? : A Little 6 Click Score: 21    End of Session   Activity Tolerance: Patient tolerated treatment well Patient left: in chair;with call bell/phone within reach;with family/visitor present Nurse Communication: Mobility status PT Visit Diagnosis: Muscle weakness (generalized) (M62.81)    Time: 1610-9604 PT Time Calculation (min) (ACUTE ONLY): 16 min   Charges:   PT Evaluation $PT Eval Low Complexity: 1 Low     PT G Codes:   PT G-Codes **NOT FOR INPATIENT CLASS** Functional Assessment Tool Used: AM-PAC 6 Clicks Basic Mobility;Clinical judgement Functional Limitation: Mobility: Walking and moving around Mobility: Walking and Moving Around Current Status (V4098): At least 1 percent but less than 20 percent impaired, limited or restricted Mobility: Walking and Moving Around Goal Status [phone]): 0 percent impaired, limited or restricted Mobility: Walking and Moving Around Discharge Status [phone]): 0 percent impaired, limited or restricted    Carmelia Bake, PT, DPT 11/12/2016 Pager: [phone]  York Ram E 11/12/2016, 3:38 PM

## 2016-11-12 NOTE — Progress Notes (Signed)
Pharmacy Antibiotic Note  Adrian Rios is a 76 y.o. male admitted on 11/11/2016 with UTI with hx of ESBL. Pharmacy has been consulted for Meropenem dosing.  Plan: Meropenem 1gm iv q8hr  Height: 5\' 7"  (170.2 cm) Weight: 160 lb (72.6 kg) IBW/kg (Calculated) : 66.1  Temp (24hrs), Avg:100.7 F (38.2 C), Min:99.3 F (37.4 C), Max:102 F (38.9 C)   Recent Labs Lab 11/12/16 0008 11/12/16 0115 11/12/16 0336  WBC 14.0*  --   --   CREATININE 1.13  --   --   LATICACIDVEN  --  0.50 1.59    Estimated Creatinine Clearance: 52 mL/min (by C-G formula based on SCr of 1.13 mg/dL).    Allergies  Allergen Reactions  . Ivp Dye [Iodinated Diagnostic Agents] Shortness Of Breath, Itching and Palpitations    "eyes itching,  Heart racing,  Effected breathing" 10-27-16 pt with 13 hr pre-meds for CT without any reaction-kj  . Shellfish Allergy Hives, Shortness Of Breath and Itching    Mostly crab  . Gabapentin Other (See Comments)    REACTION: dizziness and flushing  . Niacin-Lovastatin Er Other (See Comments)    Did not feel good on medication  . Adhesive [Tape] Rash    Use paper tape    Antimicrobials this admission: Vancomycin 11/12/2016 x1 Zosyn 11/12/2016 x1 Meropenem 11/12/2016 >>  Dose adjustments this admission: -  Microbiology results: pending  Thank you for allowing pharmacy to be a part of this patient's care.  Nani Skillern Crowford 11/12/2016 5:44 AM

## 2016-11-12 NOTE — ED Notes (Signed)
ED Provider at bedside. 

## 2016-11-12 NOTE — Care Management Note (Signed)
Case Management Note  Patient Details  Name: Adrian Rios MRN: [id number] Date of Birth: 1941-01-22  Subjective/Objective: 76 y/o m admitted w/Sepsis.From home.PT cons-await recc.                   Action/Plan:d/c plan home.   Expected Discharge Date:                  Expected Discharge Plan:  Home/Self Care  In-House Referral:     Discharge planning Services  CM Consult  Post Acute Care Choice:    Choice offered to:     DME Arranged:    DME Agency:     HH Arranged:    HH Agency:     Status of Service:  In process, will continue to follow  If discussed at Long Length of Stay Meetings, dates discussed:    Additional Comments:  Dessa Phi, RN 11/12/2016, 12:39 PM

## 2016-11-13 DIAGNOSIS — D869 Sarcoidosis, unspecified: Secondary | ICD-10-CM | POA: Diagnosis present

## 2016-11-13 DIAGNOSIS — N136 Pyonephrosis: Secondary | ICD-10-CM | POA: Diagnosis present

## 2016-11-13 DIAGNOSIS — E663 Overweight: Secondary | ICD-10-CM | POA: Diagnosis present

## 2016-11-13 DIAGNOSIS — Z9079 Acquired absence of other genital organ(s): Secondary | ICD-10-CM | POA: Diagnosis not present

## 2016-11-13 DIAGNOSIS — H409 Unspecified glaucoma: Secondary | ICD-10-CM | POA: Diagnosis not present

## 2016-11-13 DIAGNOSIS — E876 Hypokalemia: Secondary | ICD-10-CM | POA: Diagnosis present

## 2016-11-13 DIAGNOSIS — E440 Moderate protein-calorie malnutrition: Secondary | ICD-10-CM | POA: Diagnosis present

## 2016-11-13 DIAGNOSIS — B9620 Unspecified Escherichia coli [E. coli] as the cause of diseases classified elsewhere: Secondary | ICD-10-CM | POA: Diagnosis present

## 2016-11-13 DIAGNOSIS — Z91041 Radiographic dye allergy status: Secondary | ICD-10-CM

## 2016-11-13 DIAGNOSIS — N400 Enlarged prostate without lower urinary tract symptoms: Secondary | ICD-10-CM | POA: Diagnosis present

## 2016-11-13 DIAGNOSIS — E785 Hyperlipidemia, unspecified: Secondary | ICD-10-CM | POA: Diagnosis not present

## 2016-11-13 DIAGNOSIS — Z8551 Personal history of malignant neoplasm of bladder: Secondary | ICD-10-CM | POA: Diagnosis not present

## 2016-11-13 DIAGNOSIS — Z906 Acquired absence of other parts of urinary tract: Secondary | ICD-10-CM | POA: Diagnosis not present

## 2016-11-13 DIAGNOSIS — A419 Sepsis, unspecified organism: Secondary | ICD-10-CM | POA: Diagnosis present

## 2016-11-13 DIAGNOSIS — Z85828 Personal history of other malignant neoplasm of skin: Secondary | ICD-10-CM | POA: Diagnosis not present

## 2016-11-13 DIAGNOSIS — Z8546 Personal history of malignant neoplasm of prostate: Secondary | ICD-10-CM

## 2016-11-13 DIAGNOSIS — Z8249 Family history of ischemic heart disease and other diseases of the circulatory system: Secondary | ICD-10-CM | POA: Diagnosis not present

## 2016-11-13 DIAGNOSIS — F329 Major depressive disorder, single episode, unspecified: Secondary | ICD-10-CM | POA: Diagnosis not present

## 2016-11-13 DIAGNOSIS — R7303 Prediabetes: Secondary | ICD-10-CM | POA: Diagnosis present

## 2016-11-13 DIAGNOSIS — Z823 Family history of stroke: Secondary | ICD-10-CM | POA: Diagnosis not present

## 2016-11-13 DIAGNOSIS — Z87440 Personal history of urinary (tract) infections: Secondary | ICD-10-CM | POA: Diagnosis not present

## 2016-11-13 DIAGNOSIS — N390 Urinary tract infection, site not specified: Secondary | ICD-10-CM | POA: Diagnosis not present

## 2016-11-13 DIAGNOSIS — N1330 Unspecified hydronephrosis: Secondary | ICD-10-CM | POA: Diagnosis not present

## 2016-11-13 DIAGNOSIS — Z91013 Allergy to seafood: Secondary | ICD-10-CM

## 2016-11-13 DIAGNOSIS — I5032 Chronic diastolic (congestive) heart failure: Secondary | ICD-10-CM | POA: Diagnosis not present

## 2016-11-13 DIAGNOSIS — N132 Hydronephrosis with renal and ureteral calculous obstruction: Secondary | ICD-10-CM | POA: Diagnosis not present

## 2016-11-13 DIAGNOSIS — K219 Gastro-esophageal reflux disease without esophagitis: Secondary | ICD-10-CM | POA: Diagnosis present

## 2016-11-13 DIAGNOSIS — Z6825 Body mass index (BMI) 25.0-25.9, adult: Secondary | ICD-10-CM | POA: Diagnosis not present

## 2016-11-13 DIAGNOSIS — R509 Fever, unspecified: Secondary | ICD-10-CM | POA: Diagnosis not present

## 2016-11-13 DIAGNOSIS — N10 Acute tubulo-interstitial nephritis: Secondary | ICD-10-CM | POA: Diagnosis not present

## 2016-11-13 LAB — CBC
HCT: 29.6 % — ABNORMAL LOW (ref 39.0–52.0)
HEMOGLOBIN: 9.7 g/dL — AB (ref 13.0–17.0)
MCH: 27.6 pg (ref 26.0–34.0)
MCHC: 32.8 g/dL (ref 30.0–36.0)
MCV: 84.3 fL (ref 78.0–100.0)
Platelets: 434 10*3/uL — ABNORMAL HIGH (ref 150–400)
RBC: 3.51 MIL/uL — AB (ref 4.22–5.81)
RDW: 14.8 % (ref 11.5–15.5)
WBC: 15 10*3/uL — AB (ref 4.0–10.5)

## 2016-11-13 LAB — GASTROINTESTINAL PATHOGEN PANEL PCR
C. DIFFICILE TOX A/B, PCR: NOT DETECTED
CAMPYLOBACTER, PCR: NOT DETECTED
Cryptosporidium, PCR: NOT DETECTED
E COLI (STEC) STX1/STX2, PCR: NOT DETECTED
E coli (ETEC) LT/ST PCR: NOT DETECTED
E coli 0157, PCR: NOT DETECTED
Giardia lamblia, PCR: NOT DETECTED
NOROVIRUS, PCR: NOT DETECTED
ROTAVIRUS, PCR: NOT DETECTED
Salmonella, PCR: NOT DETECTED
Shigella, PCR: NOT DETECTED

## 2016-11-13 LAB — BASIC METABOLIC PANEL
ANION GAP: 8 (ref 5–15)
BUN: 19 mg/dL (ref 6–20)
CHLORIDE: 108 mmol/L (ref 101–111)
CO2: 23 mmol/L (ref 22–32)
Calcium: 8.6 mg/dL — ABNORMAL LOW (ref 8.9–10.3)
Creatinine, Ser: 1.29 mg/dL — ABNORMAL HIGH (ref 0.61–1.24)
GFR calc Af Amer: >60 mL/min (ref >60)
GFR, EST NON AFRICAN AMERICAN: 52 mL/min — AB (ref >60)
Glucose, Bld: 107 mg/dL — ABNORMAL HIGH (ref 65–99)
POTASSIUM: 3.4 mmol/L — AB (ref 3.5–5.1)
SODIUM: 139 mmol/L (ref 135–145)

## 2016-11-13 MED ORDER — FUROSEMIDE 10 MG/ML IJ SOLN: 20.0000 mg | Freq: Every day | INTRAMUSCULAR | Status: DC

## 2016-11-13 MED ORDER — FLUCONAZOLE 100 MG PO TABS
200.0000 mg | ORAL_TABLET | Freq: Every day | ORAL | Status: DC
  Administered 2016-11-13 – 2016-11-18 (×6): 200 mg via ORAL
  Filled 2016-11-13 (×6): qty 2

## 2016-11-13 MED ORDER — POTASSIUM CHLORIDE CRYS ER 20 MEQ PO TBCR
20.0000 meq | EXTENDED_RELEASE_TABLET | Freq: Once | ORAL | Status: AC
  Administered 2016-11-13: 20 meq via ORAL
  Filled 2016-11-13: qty 1

## 2016-11-13 MED ORDER — POTASSIUM CHLORIDE CRYS ER 20 MEQ PO TBCR: 40.0000 meq | EXTENDED_RELEASE_TABLET | Freq: Once | ORAL | Status: DC

## 2016-11-13 NOTE — Consult Note (Signed)
Reason for Consult: Bladder Cancer, Recurrent Pyelonephritis  Referring Physician: Domenic Polite MD  Adrian Rios is an 76 y.o. male.   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 Margins negative. Urethra remains in situ.   Post-op Surveillance:  - 09/2016 CT - no recurrence.   2 - Bilateral NON-Complex Renal Cysts - left peripelvic cysts w/o hydro or mass effect and right <1cm cortical cysts x 2 on hematuria CT 04/2016. No mass effect / enhancing nodules / or coarse calcifications.   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   5- Recurrent Pyelonephritis / / Possible Partial Left Ureteral Obstruction- few episodes febrile UTI following cystectomy 05/2016, including yeast as well as coliforms with variable resistance, (previously e. Coli res to all but zosyn, cipro, merrem, nitro). CT 09/2016 w/o signifcant obstruction / stones / abscesses, just chronic left caliectasis. He does have some chronic left mild hydro that pre-dates his cystectomy making evaluation for new obstruction difficult.    PMH sig for appy, TNA. NO ischemic CV disease. His PCP is Adrian Amy MD.   Today " Adrian Rios " is seen in consultation for above, specifically for opinion on recurrent febrile UTI.    Past Medical History:  Diagnosis Date  . Allergic rhinitis   . Arthritis   . Benign localized prostatic hyperplasia with lower urinary tract symptoms (LUTS)   . Bladder tumor   . Borderline glaucoma of right eye   . Cancer [facility])    bladder and prostate  . Carotid bruit    per duplex 03-11-2016 RICA 1-39%  .  Chronic throat clearing   . DDD (degenerative disc disease), lumbar   . Depression   . ED (erectile dysfunction)   . Elevated PSA    urologist-  Adrian Rios--- s/p  prostate bx's  . GERD (gastroesophageal reflux disease)   . Hematuria   . History of chronic bronchitis   . 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 History of squamous cell carcinoma excision    several times  . Hyperlipidemia   . Pre-diabetes   . Premature ventricular contractions (PVCs) (VPCs)   . RAD (reactive airway disease)   . 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 Sensorineural hearing loss (SNHL) of both ears   . Sepsis ([place]) 09/2016  . UTI (urinary tract infection) 08/2016  . Wears glasses   . Wears hearing aid    bilateral    Past Surgical History:  Procedure Laterality Date  . APPENDECTOMY  2008  . CARDIOVASCULAR STRESS TEST  05/27/2010   normal nuclear study w/ no ischemia/  normal LV function and wall motion , ef 60%  . CYSTOSCOPY WITH INJECTION N/A 06/12/2016   Procedure: CYSTOSCOPY WITH INJECTION OF INDOCYANINE GREEN DYE;  Surgeon: Adrian Frock, MD;  Location: [facility];  Service: Urology;  Laterality: N/A;  . DEEP NECK LYMPH NODE BIOPSY / EXCISION  1970's   and Bronchoscopy w/ bx's ( dx Sarcoidosis)  . ILEOSTOMY    . MOHS SURGERY  2013 approx.    nasal; pre melanoma  . PARS PLANA VITRECTOMY Right 2007   repair macular pucker  . TONSILLECTOMY AND ADENOIDECTOMY  child  . TRANSURETHRAL RESECTION OF BLADDER TUMOR N/A 04/06/2016   Procedure: CYSTOSCOPY TRANSURETHRAL RESECTION OF BLADDER TUMORS (TURBT);  Surgeon: Adrian Clines, MD;  Location: [facility];  Service: Urology;  Laterality: N/A;  . TRANSURETHRAL RESECTION OF PROSTATE      Family History  Problem Relation Age of Onset  . Stroke Mother        in her 20s  . Breast cancer Sister   . Heart disease Maternal  Grandmother 84       MI   . Breast cancer Paternal Grandmother   . Heart disease Paternal Grandfather 62       MI  . Stroke Brother   . Diabetes Neg Hx     Social History:  reports that he has never smoked. He has never used smokeless tobacco. He reports that he does not drink alcohol or use drugs.  Allergies:  Allergies  Allergen Reactions  . Ivp Dye [Iodinated Diagnostic Agents] Shortness Of Breath, Itching and Palpitations    "eyes itching,  Heart racing,  Effected breathing" 10-27-16 pt with 13 hr pre-meds for CT without any reaction-kj  . Shellfish Allergy Hives, Shortness Of Breath and Itching    Mostly crab  . Gabapentin Other (See Comments)    REACTION: dizziness and flushing  . Niacin-Lovastatin Er Other (See Comments)    Did not feel good on medication  . Adhesive [Tape] Rash    Use paper tape    Medications: I have reviewed the patient's current medications.    Review of Systems  Constitutional: Positive for fever and malaise/fatigue.  HENT: Negative.   Eyes: Negative.   Respiratory: Negative.   Cardiovascular: Negative.   Gastrointestinal: Negative.   Genitourinary: Positive for flank pain. Negative for hematuria.  Skin: Negative.   Neurological: Negative.   Endo/Heme/Allergies: Negative.   Psychiatric/Behavioral: Negative.    Blood pressure 116/72, pulse 95, temperature 99.4 F (37.4 C), temperature source Oral, resp. rate 18, height 5\' 7"  (1.702 m), weight 71.9 kg (158 lb 8.2 oz), SpO2 95 %. Physical Exam  Constitutional: He appears well-developed.  Very vigorous for age.   HENT:  Head: Normocephalic.  Eyes: Pupils are equal, round, and reactive to light.  Neck: Normal range of motion.  Cardiovascular: Normal rate.   Respiratory: Effort normal.  GI: Soft.  Genitourinary:  Genitourinary Comments: RLQ Urostomy pink / patent of yellow urine with scant mucus that is non-foul.   Musculoskeletal: Normal range of motion.  Neurological: He is alert.   Skin: Skin is warm.  Psychiatric: He has a normal mood and affect.    Assessment/Plan:   1 - Bladder Cancer - no evidence of recurrent disease by imaging on last CT in 09/2016, he will be receiving repeat surveillance eval in about 56mo.   2 - Bilateral NON-Complex Renal Cysts - stable, No indication for furhter evaluation or treatment.   3- Moderate Risk Prostate Cancer - PSA at next cancer surveillance visit in about 1 mos  4 - Urinary Diversion - working well with normal GFR. He is facile with urostomy changes.   5- Pyelonephritis - Recurrence pattern concerning. Fortunately imaging w/o stones, abscesses, acute obstruction or other surgically correctable factors. His CT from 10/27/16 does show bilateral hydroureteronephrosis, left more pronounced than right. This is expected given the refluxing nature of his uretero-ileal conduit anastomosis.  He had pre-op left hydro which was actually more severe than what was seen on his 09/2016 CT. Agree with current ABX per ID.   I feel though that is would be clearly prudent to maximally rule out obstruction with a funcitonal renal test such as lasix renogram. I have ordered this and I feel should be done piror to discharge. If it reveals significant obstruction, then nephrostomy this admission would be best plan and then antegrade ureteroscopy in elective setting.

## 2016-11-13 NOTE — Progress Notes (Signed)
Pharmacy IV to PO conversion  This patient is receiving Diflucan by the intravenous route. Based on criteria approved by the Pharmacy and Therapeutics Committee, and the Infectious Disease Division, the antibiotic(s) is/are being converted to equivalent oral dose form(s). These criteria include:   Patient being treated for a respiratory tract infection, urinary tract infection, cellulitis, or Clostridium Difficile Associated Diarrhea  The patient is not neutropenic and does not exhibit a GI malabsorption state  The patient is eating (either orally or per tube) and/or has been taking other orally administered medications for at least 24 hours.  The patient is improving clinically (physician assessment and a 24-hour Tmax of <=100.5 F)  If you have any questions about this conversion, please contact the Pharmacy Department (ext [phone]).  Thank you.  Reuel Boom, PharmD Pager: [phone] 11/13/2016, 10:03 AM

## 2016-11-13 NOTE — Care Management Note (Signed)
Case Management Note  Patient Details  Name: Adrian Rios MRN: [id number] Date of Birth: 1940/08/01  Subjective/Objective: PT recc HHPT. Patient/spouse chose [facility] for [employer] aware, & accepted. HHPT ordered. Awaiting d/c.                   Action/Plan:d/c home w/HHC.   Expected Discharge Date:                  Expected Discharge Plan:  [place]  In-House Referral:     Discharge planning Services  CM Consult  Post Acute Care Choice:    Choice offered to:  Patient  DME Arranged:    DME Agency:     HH Arranged:  PT HH Agency:  [facility]  Status of Service:  In process, will continue to follow  If discussed at Long Length of Stay Meetings, dates discussed:    Additional Comments:  Dessa Phi, RN 11/13/2016, 1:03 PM

## 2016-11-13 NOTE — Progress Notes (Signed)
PROGRESS NOTE    Adrian Rios  WPY:[id number] DOB: [birth year] DOA: 11/11/2016 PCP: [name], MD    Brief Narrative:  Adrian Rios is a 76 y.o. 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 Patient was recently discharged 2 weeks prior from the hospitalist service for similar. In June, patient had a urinary tract infection which grew out ESBL E.Coli.  Urine cultures from the recent hospitalization 2 weeks ago gram multiple colonies unable to be classified as well as yeast. Patient was treated as if he had ESBL Escherichia coli and discharged on Cipro of which she completed his full course.  Since then, patient has been doing okay at home. His stools have been quite loose. He says it is been on antibiotics intermittently for the past 6 months and so at times they are quite watery. He saw GI as an outpatient a week ago and at that time there was consideration for colonoscopy, but GI raised the possibility of C. difficile infection and wanted him tested for this first. (Unable to find records if this was done.) Patient started to experience fever and malaise last night so he came into the emergency room because he was worried about recurrence of infection again.     Assessment & Plan:   Active Problems:   Sarcoidosis ([place])   Glaucoma   Overweight (BMI 25.0-29.9)   Fever   Bladder cancer (HCC)   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HCC)   SIRS (systemic inflammatory response syndrome) (HCC)   Malnutrition of moderate degree   Chronic diastolic CHF (congestive heart failure) ([place])   1-Recurrent UTI;  Last urine culture grew yeast.  Await urine culture results.  Continue with diflucan.  ID consulted due to recurrent UTI.  Dr Margarette Canada.  Blood culture no growth to date.   2-SIRS, related to UTI; Continue with diflucan.   3-Loose stool. No watery diarrhea.    Discontinue C diff ordered.   Hypokalemia; replete orally.   Sarcoidosis ([place]): Stable . Glaucoma: Continue drops . Overweight (BMI 25.0-29.9): Meets criteria with BMI greater than 25 . Bladder cancer ([place]) status post ileal conduit . Malnutrition    DVT prophylaxis: Lovenox Code Status: full code.  Family Communication: Wife and son  Disposition Plan: home when stable.    Consultants:   none   Procedures: none   Antimicrobials: Other; diflucan.    Subjective: He is feeling better. He report recurrent UTI. He and his wife are frustrated.    Objective: Vitals:   11/12/16 2041 11/12/16 2105 11/13/16 0444 11/13/16 0445  BP:  104/62 116/68   Pulse:  86 86   Resp:  19 18   Temp:  98.5 F (36.9 C) 97.7 F (36.5 C)   TempSrc:  Oral Oral   SpO2: 95% 97% 98%   Weight:    71.9 kg (158 lb 8.2 oz)  Height:        Intake/Output Summary (Last 24 hours) at 11/13/16 0734 Last data filed at 11/13/16 0200  Gross per 24 hour  Intake          1188.42 ml  Output              800 ml  Net           388.42 ml   Filed Weights   11/11/16 2239 11/13/16 0445  Weight: 72.6 kg (160 lb) 71.9  kg (158 lb 8.2 oz)    Examination:  General exam: Appears calm and comfortable  Respiratory system: Clear to auscultation. Respiratory effort normal. Cardiovascular system: S1 & S2 heard, RRR. No JVD, murmurs, rubs, gallops or clicks. No pedal edema. Gastrointestinal system: Abdomen is nondistended, soft and nontender. No organomegaly or masses felt. Normal bowel sounds heard. Ileal conduit in place.  Central nervous system: Alert and oriented. No focal neurological deficits. Extremities: Symmetric 5 x 5 power. Skin: No rashes, lesions or ulcers    Data Reviewed: I have personally reviewed following labs and imaging studies  CBC:  Recent Labs Lab 11/12/16 0008 11/12/16 1342 11/13/16 0524  WBC 14.0* 17.2* 15.0*  NEUTROABS 11.3*  --   --   HGB 10.2* 10.2* 9.7*  HCT 31.4* 30.7*  29.6*  MCV 85.1 82.5 84.3  PLT 502* 416* 341*   Basic Metabolic Panel:  Recent Labs Lab 11/12/16 0008 11/12/16 1342 11/13/16 0524  NA 135  --  139  K 3.6  --  3.4*  CL 107  --  108  CO2 21*  --  23  GLUCOSE 125*  --  107*  BUN 23*  --  19  CREATININE 1.13 1.23 1.29*  CALCIUM 8.9  --  8.6*   GFR: Estimated Creatinine Clearance: 45.5 mL/min (A) (by C-G formula based on SCr of 1.29 mg/dL (H)). Liver Function Tests:  Recent Labs Lab 11/12/16 0008  AST 21  ALT 29  ALKPHOS 114  BILITOT 0.5  PROT 7.2  ALBUMIN 2.9*   No results for input(s): LIPASE, AMYLASE in the last 168 hours. No results for input(s): AMMONIA in the last 168 hours. Coagulation Profile: No results for input(s): INR, PROTIME in the last 168 hours. Cardiac Enzymes: No results for input(s): CKTOTAL, CKMB, CKMBINDEX, TROPONINI in the last 168 hours. BNP (last 3 results) No results for input(s): PROBNP in the last 8760 hours. HbA1C: No results for input(s): HGBA1C in the last 72 hours. CBG: No results for input(s): GLUCAP in the last 168 hours. Lipid Profile: No results for input(s): CHOL, HDL, LDLCALC, TRIG, CHOLHDL, LDLDIRECT in the last 72 hours. Thyroid Function Tests: No results for input(s): TSH, T4TOTAL, FREET4, T3FREE, THYROIDAB in the last 72 hours. Anemia Panel: No results for input(s): VITAMINB12, FOLATE, FERRITIN, TIBC, IRON, RETICCTPCT in the last 72 hours. Sepsis Labs:  Recent Labs Lab 11/12/16 0115 11/12/16 0336 11/12/16 1343  LATICACIDVEN 0.50 1.59 1.3    No results found for this or any previous visit (from the past 240 hour(s)).       Radiology Studies: Dg Chest 2 View  Result Date: 11/12/2016 CLINICAL DATA:  Sepsis.  Fever.  Dizziness, onset yesterday. EXAM: CHEST  2 VIEW COMPARISON:  Radiographs and CT 10/25/2016 FINDINGS: The cardiomediastinal contours are unchanged. Bibasilar atelectasis or scarring, similar to prior exam. Pulmonary vasculature is normal. No  consolidation, pleural effusion, or pneumothorax. No acute osseous abnormalities are seen. IMPRESSION: Bibasilar atelectasis or scarring, unchanged from prior exam. No acute abnormality. Electronically Signed   By: Jeb Levering M.D.   On: 11/12/2016 02:08        Scheduled Meds: . enoxaparin (LOVENOX) injection  40 mg Subcutaneous Daily  . famotidine  10 mg Oral Daily  . fluticasone  1 spray Each Nare QHS  . furosemide  20 mg Intravenous BID  . magnesium oxide  400 mg Oral Daily  . methenamine  1,000 mg Oral BID  . mometasone-formoterol  2 puff Inhalation BID  . montelukast  10 mg Oral Daily  . protein supplement shake  11 oz Oral Q24H  . saccharomyces boulardii  250 mg Oral BID  . venlafaxine  75 mg Oral Daily   Continuous Infusions: . sodium chloride 10 mL/hr at 11/12/16 1022  . fluconazole (DIFLUCAN) IV Stopped (11/12/16 1255)     LOS: 0 days    Time spent: 35 minutes.     Elmarie Shiley, MD [facility] Pager [phone]  If 7PM-7AM, please contact night-coverage [email or web address] Password [facility] 11/13/2016, 7:34 AM

## 2016-11-13 NOTE — Consult Note (Signed)
[facility] for Infectious Disease    Date of Admission:  11/11/2016           Day 2 fluconazole       Reason for Consult: Recurrent urinary tract infections    Referring Provider: Dr. Niel Hummer  Assessment: Adrian Rios has recurrent urinary tract infections following cystoprostatectomy and ileal conduit diversion. Ileal conduits aren't known to increase the risk of asymptomatic bacteriuria and symptomatic infections but usually not to this degree. He notes that he did not have problems until his ureteral stents were removed. His surgeon, Dr. Tresa Moore saw him on 10/27/2016 and noted that the CT scan did not show any evidence of recurrent cancer but he did not comment on the bilateral hydroureter and left hydronephrosis. He has had left flank pain frequently when he had symptomatic infection. I wonder if he has some degree of obstruction contributing to his risk for recurrent infection. I would suggest reviewing this with Dr. Tresa Moore or having him follow-up with Dr. Tresa Moore in the office soon to discuss this. I do not feel that there is a role for chronic suppressive/prophylactic antibiotics since he has had infection with a variety of different organisms.  He is now afebrile and feeling better after broad empiric antimicrobial therapy with vancomycin, piperacillin tazobactam and fluconazole. Urine and blood cultures are negative at 24 hours. I will continue oral fluconazole pending final culture results. I will have my partner, Dr. Carlyle Basques [phone]) check cultures and leave recommendations for discharge antibiotics this weekend.  Plan: 1. Continue fluconazole pending final culture results 2. Recommend reviewing recent CT scan results with Dr. Tresa Moore   Principal Problem:   Recurrent urinary tract infection Active Problems:   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HCC)   History of bladder cancer   History of prostate cancer   Sarcoidosis ([place])   Hyperlipidemia   Depression   Glaucoma  Overweight (BMI 25.0-29.9)   Fever   SIRS (systemic inflammatory response syndrome) (HCC)   Malnutrition of moderate degree   Chronic diastolic CHF (congestive heart failure) ([place])   . enoxaparin (LOVENOX) injection  40 mg Subcutaneous Daily  . famotidine  10 mg Oral Daily  . fluconazole  200 mg Oral Daily  . fluticasone  1 spray Each Nare QHS  . magnesium oxide  400 mg Oral Daily  . methenamine  1,000 mg Oral BID  . mometasone-formoterol  2 puff Inhalation BID  . montelukast  10 mg Oral Daily  . protein supplement shake  11 oz Oral Q24H  . venlafaxine  75 mg Oral Daily    HPI: Adrian Rios is a 76 y.o. male who underwent robotic cystoprostatectomy and ileal conduit formation in April of this year for bladder and prostate cancer. His ureteral stents were removed about one month postop. He has had problems with recurrent urinary tract infection since that time and was admitted in early June and again and late August. He estimates that he has been treated 7-8 times for urinary tract infection since his surgery. I do not have access to any outpatient urine culture results but urine cultures that have been done here show the following:  Urine culture 08/04/2016: Pansensitive Escherichia coli Urine culture 08/21/2016: ESBL producing Escherichia coli Urine culture 10/25/2016: Multiple species Urine culture 10/29/2016: Yeast  When he was last discharged on 10/28/2016 he was prescribed ciprofloxacin and methenamine. He had a CT scan on 10/27/2016 which showed:  CT abdomen and pelvis with contrast  PRESSION: 1. Ileal conduit with hydronephrosis and hydroureter of the LEFT renal collecting system. No evidence of obstruction of the conduit. 2. Several small filling defects within the ileal conduit are favored debris. 3. Incidental findings of atherosclerosis and diverticulosis.    By: Suzy Bouchard M.D.   On: 10/27/2016 11:32  He was admitted again on 11/11/2016 with fever and  malaise. He also had some left flank pain similar to previous episodes. He received 1 dose of IV vancomycin and piperacillin tazobactam in the emergency department and was started on fluconazole yesterday. He is now afebrile and feeling much better. His major concern is why this keeps happening and what, if anything, can be done to prevent future recurrences.   Review of Systems: Review of Systems  Constitutional: Positive for chills, fever, malaise/fatigue and weight loss. Negative for diaphoresis.       He has had anorexia and a 20 pound unintentional weight loss since surgery  HENT: Negative for sore throat.   Respiratory: Positive for shortness of breath. Negative for cough and sputum production.   Cardiovascular: Negative for chest pain.  Gastrointestinal: Negative for abdominal pain, diarrhea, heartburn, nausea and vomiting.       He has had intermittent diarrhea that he associates with having been on some any antibiotics recently but he has not had any diarrhea recently.  Musculoskeletal: Negative for joint pain and myalgias.  Skin: Negative for rash.  Neurological: Negative for dizziness and headaches.  Psychiatric/Behavioral: Positive for depression.    Past Medical History:  Diagnosis Date  . Allergic rhinitis   . Arthritis   . Benign localized prostatic hyperplasia with lower urinary tract symptoms (LUTS)   . Bladder tumor   . Borderline glaucoma of right eye   . Cancer [facility])    bladder and prostate  . Carotid bruit    per duplex 03-11-2016 RICA 1-39%  . Chronic throat clearing   . DDD (degenerative disc disease), lumbar   . Depression   . ED (erectile dysfunction)   . Elevated PSA    urologist-  dr [name]--- s/p  prostate bx's  . GERD (gastroesophageal reflux disease)   . Hematuria   . History of chronic bronchitis   . 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 History of squamous cell carcinoma excision    several times  .  Hyperlipidemia   . Pre-diabetes   . Premature ventricular contractions (PVCs) (V[facility])   . RAD (reactive airway disease)   . 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 Sensorineural hearing loss (SNHL) of both ears   . Sepsis ([place]) 09/2016  . UTI (urinary tract infection) 08/2016  . Wears glasses   . Wears hearing aid    bilateral    Social History  Substance Use Topics  . Smoking status: Never Smoker  . Smokeless tobacco: Never Used  . Alcohol use No    Family History  Problem Relation Age of Onset  . Stroke Mother        in her 54s  . Breast cancer Sister   . Heart disease Maternal Grandmother 84       MI   . Breast cancer Paternal Grandmother   . Heart disease Paternal Grandfather 57       MI  . Stroke Brother   . Diabetes Neg Hx  Allergies  Allergen Reactions  . Ivp Dye [Iodinated Diagnostic Agents] Shortness Of Breath, Itching and Palpitations    "eyes itching,  Heart racing,  Effected breathing" 10-27-16 pt with 13 hr pre-meds for CT without any reaction-kj  . Shellfish Allergy Hives, Shortness Of Breath and Itching    Mostly crab  . Gabapentin Other (See Comments)    REACTION: dizziness and flushing  . Niacin-Lovastatin Er Other (See Comments)    Did not feel good on medication  . Adhesive [Tape] Rash    Use paper tape    OBJECTIVE: Blood pressure 116/68, pulse 86, temperature 97.7 F (36.5 C), temperature source Oral, resp. rate 18, height 5\' 7"  (1.702 m), weight 158 lb 8.2 oz (71.9 kg), SpO2 96 %.  Physical Exam  Constitutional: He is oriented to person, place, and time.  He is in good spirits.  Cardiovascular: Normal rate and regular rhythm.   No murmur heard. Pulmonary/Chest: Effort normal and breath sounds normal. He has no wheezes.  Abdominal: Soft. He exhibits no distension. There is no tenderness.  Normal-appearing urine in his ileal conduit bag.  Neurological: He is  alert and oriented to person, place, and time.  Psychiatric: Mood and affect normal.    Lab Results Lab Results  Component Value Date   WBC 15.0 (H) 11/13/2016   HGB 9.7 (L) 11/13/2016   HCT 29.6 (L) 11/13/2016   MCV 84.3 11/13/2016   PLT 434 (H) 11/13/2016    Lab Results  Component Value Date   CREATININE 1.29 (H) 11/13/2016   BUN 19 11/13/2016   NA 139 11/13/2016   K 3.4 (L) 11/13/2016   CL 108 11/13/2016   CO2 23 11/13/2016    Lab Results  Component Value Date   ALT 29 11/12/2016   AST 21 11/12/2016   ALKPHOS 114 11/12/2016   BILITOT 0.5 11/12/2016     Microbiology: Recent Results (from the past 240 hour(s))  Blood Culture (routine x 2)     Status: None (Preliminary result)   Collection Time: 11/12/16 12:10 AM  Result Value Ref Range Status   Specimen Description BLOOD LEFT FOREARM  Final   Special Requests   Final    BOTTLES DRAWN AEROBIC AND ANAEROBIC Blood Culture adequate volume   Culture   Final    NO GROWTH 1 DAY Performed at [facility], [place] [address] [place], [place] [address]    Report Status PENDING  Incomplete  Blood Culture (routine x 2)     Status: None (Preliminary result)   Collection Time: 11/12/16  1:00 AM  Result Value Ref Range Status   Specimen Description BLOOD LEFT HAND  Final   Special Requests   Final    BOTTLES DRAWN AEROBIC AND ANAEROBIC Blood Culture adequate volume   Culture   Final    NO GROWTH 1 DAY Performed at [facility], [place] [address], [place], [place] [address]    Report Status PENDING  Incomplete    Michel Bickers, MD [facility] [phone] pager   [phone] cell 11/13/2016, 11:16 AM

## 2016-11-14 LAB — BASIC METABOLIC PANEL
ANION GAP: 9 (ref 5–15)
BUN: 17 mg/dL (ref 6–20)
CALCIUM: 8.7 mg/dL — AB (ref 8.9–10.3)
CO2: 22 mmol/L (ref 22–32)
Chloride: 108 mmol/L (ref 101–111)
Creatinine, Ser: 1.14 mg/dL (ref 0.61–1.24)
GFR calc Af Amer: >60 mL/min (ref >60)
GLUCOSE: 108 mg/dL — AB (ref 65–99)
Potassium: 3.4 mmol/L — ABNORMAL LOW (ref 3.5–5.1)
SODIUM: 139 mmol/L (ref 135–145)

## 2016-11-14 LAB — URINE CULTURE: CULTURE: NO GROWTH

## 2016-11-14 LAB — CBC
HCT: 29.5 % — ABNORMAL LOW (ref 39.0–52.0)
Hemoglobin: 9.7 g/dL — ABNORMAL LOW (ref 13.0–17.0)
MCH: 27.6 pg (ref 26.0–34.0)
MCHC: 32.9 g/dL (ref 30.0–36.0)
MCV: 84 fL (ref 78.0–100.0)
PLATELETS: 500 10*3/uL — AB (ref 150–400)
RBC: 3.51 MIL/uL — ABNORMAL LOW (ref 4.22–5.81)
RDW: 14.7 % (ref 11.5–15.5)
WBC: 11.3 10*3/uL — AB (ref 4.0–10.5)

## 2016-11-14 MED ORDER — POTASSIUM CHLORIDE CRYS ER 20 MEQ PO TBCR
40.0000 meq | EXTENDED_RELEASE_TABLET | Freq: Once | ORAL | Status: AC
  Administered 2016-11-14: 40 meq via ORAL
  Filled 2016-11-14: qty 2

## 2016-11-14 NOTE — Progress Notes (Signed)
PROGRESS NOTE    Adrian Rios  TFT:[id number] DOB: 07-29-1940 DOA: 11/11/2016 PCP: [name], MD    Brief Narrative:  Adrian Rios is a 76 y.o. 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 Patient was recently discharged 2 weeks prior from the hospitalist service for similar. In June, patient had a urinary tract infection which grew out ESBL E.Coli.  Urine cultures from the recent hospitalization 2 weeks ago gram multiple colonies unable to be classified as well as yeast. Patient was treated as if he had ESBL Escherichia coli and discharged on Cipro of which she completed his full course.  Since then, patient has been doing okay at home. His stools have been quite loose. He says it is been on antibiotics intermittently for the past 6 months and so at times they are quite watery. He saw GI as an outpatient a week ago and at that time there was consideration for colonoscopy, but GI raised the possibility of C. difficile infection and wanted him tested for this first. (Unable to find records if this was done.) Patient started to experience fever and malaise last night so he came into the emergency room because he was worried about recurrence of infection again.     Assessment & Plan:   Principal Problem:   Recurrent urinary tract infection Active Problems:   Sarcoidosis ([place])   Hyperlipidemia   Depression   Glaucoma   Overweight (BMI 25.0-29.9)   Fever   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HCC)   History of bladder cancer   History of prostate cancer   SIRS (systemic inflammatory response syndrome) (HCC)   Malnutrition of moderate degree   Chronic diastolic CHF (congestive heart failure) ([place])   Sepsis ([place])   1-Recurrent UTI;  Last urine culture grew yeast.  Urine culture with no growth  Continue with diflucan.  ID consulted due to recurrent UTI. Recommend  urology evaluation.  Dr Margarette Canada. Recommend renogram.  Blood culture no growth to date.   2-SIRS, related to UTI; Continue with diflucan.   3-Loose stool. No watery diarrhea.  Discontinue C diff ordered.   Hypokalemia; replete orally.   Sarcoidosis ([place]): Stable . Glaucoma: Continue drops . Overweight (BMI 25.0-29.9): Meets criteria with BMI greater than 25 . Bladder cancer ([place]) status post ileal conduit . Malnutrition    DVT prophylaxis: Lovenox Code Status: full code.  Family Communication: Wife and son  Disposition Plan: home when stable.    Consultants:   none   Procedures: none   Antimicrobials: Other; diflucan.    Subjective: He is feeling better,  Objective: Vitals:   11/13/16 2113 11/14/16 0417 11/14/16 0917 11/14/16 1414  BP:  115/77  115/71  Pulse:  82  84  Resp:  15  18  Temp:  99.4 F (37.4 C)  98.6 F (37 C)  TempSrc:  Oral  Oral  SpO2: 95% 95% 97% 96%  Weight:  71.6 kg (157 lb 13.6 oz)    Height:        Intake/Output Summary (Last 24 hours) at 11/14/16 1513 Last data filed at 11/14/16 0600  Gross per 24 hour  Intake              280 ml  Output              700 ml  Net             -  420 ml   Filed Weights   11/11/16 2239 11/13/16 0445 11/14/16 0417  Weight: 72.6 kg (160 lb) 71.9 kg (158 lb 8.2 oz) 71.6 kg (157 lb 13.6 oz)    Examination:  General exam: NAD Respiratory system: CTA Cardiovascular system: S 1 , S 2 RRR Gastrointestinal system: Abdomen is nondistended, soft and nontender. No organomegaly or masses felt. Normal bowel sounds heard. Ileal conduit in place.  Central nervous system: non focal.  Extremities: Symmetric power.  Skin: No rash     Data Reviewed: I have personally reviewed following labs and imaging studies  CBC:  Recent Labs Lab 11/12/16 0008 11/12/16 1342 11/13/16 0524 11/14/16 0756  WBC 14.0* 17.2* 15.0* 11.3*  NEUTROABS 11.3*  --   --   --   HGB 10.2* 10.2* 9.7* 9.7*  HCT 31.4* 30.7*  29.6* 29.5*  MCV 85.1 82.5 84.3 84.0  PLT 502* 416* 434* 409*   Basic Metabolic Panel:  Recent Labs Lab 11/12/16 0008 11/12/16 1342 11/13/16 0524 11/14/16 0756  NA 135  --  139 139  K 3.6  --  3.4* 3.4*  CL 107  --  108 108  CO2 21*  --  23 22  GLUCOSE 125*  --  107* 108*  BUN 23*  --  19 17  CREATININE 1.13 1.23 1.29* 1.14  CALCIUM 8.9  --  8.6* 8.7*   GFR: Estimated Creatinine Clearance: 51.5 mL/min (by C-G formula based on SCr of 1.14 mg/dL). Liver Function Tests:  Recent Labs Lab 11/12/16 0008  AST 21  ALT 29  ALKPHOS 114  BILITOT 0.5  PROT 7.2  ALBUMIN 2.9*   No results for input(s): LIPASE, AMYLASE in the last 168 hours. No results for input(s): AMMONIA in the last 168 hours. Coagulation Profile: No results for input(s): INR, PROTIME in the last 168 hours. Cardiac Enzymes: No results for input(s): CKTOTAL, CKMB, CKMBINDEX, TROPONINI in the last 168 hours. BNP (last 3 results) No results for input(s): PROBNP in the last 8760 hours. HbA1C: No results for input(s): HGBA1C in the last 72 hours. CBG: No results for input(s): GLUCAP in the last 168 hours. Lipid Profile: No results for input(s): CHOL, HDL, LDLCALC, TRIG, CHOLHDL, LDLDIRECT in the last 72 hours. Thyroid Function Tests: No results for input(s): TSH, T4TOTAL, FREET4, T3FREE, THYROIDAB in the last 72 hours. Anemia Panel: No results for input(s): VITAMINB12, FOLATE, FERRITIN, TIBC, IRON, RETICCTPCT in the last 72 hours. Sepsis Labs:  Recent Labs Lab 11/12/16 0115 11/12/16 0336 11/12/16 1343  LATICACIDVEN 0.50 1.59 1.3    Recent Results (from the past 240 hour(s))  Blood Culture (routine x 2)     Status: None (Preliminary result)   Collection Time: 11/12/16 12:10 AM  Result Value Ref Range Status   Specimen Description BLOOD LEFT FOREARM  Final   Special Requests   Final    BOTTLES DRAWN AEROBIC AND ANAEROBIC Blood Culture adequate volume   Culture   Final    NO GROWTH 2 DAYS Performed  at [facility], [address], [place], [place] [address]    Report Status PENDING  Incomplete  Blood Culture (routine x 2)     Status: None (Preliminary result)   Collection Time: 11/12/16  1:00 AM  Result Value Ref Range Status   Specimen Description BLOOD LEFT HAND  Final   Special Requests   Final    BOTTLES DRAWN AEROBIC AND ANAEROBIC Blood Culture adequate volume   Culture   Final  NO GROWTH 2 DAYS Performed at [facility], [place] [address], [place], [place] [address]    Report Status PENDING  Incomplete  Culture, Urine     Status: None   Collection Time: 11/12/16 10:19 PM  Result Value Ref Range Status   Specimen Description URINE, RANDOM  Final   Special Requests NONE  Final   Culture   Final    NO GROWTH Performed at [facility], [place] [address] [place], [place] [address]    Report Status 11/14/2016 FINAL  Final         Radiology Studies: No results found.      Scheduled Meds: . enoxaparin (LOVENOX) injection  40 mg Subcutaneous Daily  . famotidine  10 mg Oral Daily  . fluconazole  200 mg Oral Daily  . fluticasone  1 spray Each Nare QHS  . magnesium oxide  400 mg Oral Daily  . methenamine  1,000 mg Oral BID  . mometasone-formoterol  2 puff Inhalation BID  . montelukast  10 mg Oral Daily  . potassium chloride  40 mEq Oral Once  . protein supplement shake  11 oz Oral Q24H  . venlafaxine  75 mg Oral Daily   Continuous Infusions: . sodium chloride 10 mL/hr at 11/13/16 2145     LOS: 1 day    Time spent: 35 minutes.     Elmarie Shiley, MD [facility] Pager [phone]  If 7PM-7AM, please contact night-coverage [email or web address] Password [facility] 11/14/2016, 3:13 PM

## 2016-11-14 NOTE — Plan of Care (Signed)
Problem: Activity: Goal: Risk for activity intolerance will decrease Outcome: Progressing Pt ambulated on 1st shift.

## 2016-11-15 LAB — BASIC METABOLIC PANEL
ANION GAP: 7 (ref 5–15)
BUN: 19 mg/dL (ref 6–20)
CHLORIDE: 113 mmol/L — AB (ref 101–111)
CO2: 23 mmol/L (ref 22–32)
Calcium: 9.2 mg/dL (ref 8.9–10.3)
Creatinine, Ser: 1.1 mg/dL (ref 0.61–1.24)
Glucose, Bld: 105 mg/dL — ABNORMAL HIGH (ref 65–99)
POTASSIUM: 4.1 mmol/L (ref 3.5–5.1)
SODIUM: 143 mmol/L (ref 135–145)

## 2016-11-15 LAB — CBC
HCT: 29.8 % — ABNORMAL LOW (ref 39.0–52.0)
HEMOGLOBIN: 9.8 g/dL — AB (ref 13.0–17.0)
MCH: 27.6 pg (ref 26.0–34.0)
MCHC: 32.9 g/dL (ref 30.0–36.0)
MCV: 83.9 fL (ref 78.0–100.0)
PLATELETS: 545 10*3/uL — AB (ref 150–400)
RBC: 3.55 MIL/uL — AB (ref 4.22–5.81)
RDW: 14.4 % (ref 11.5–15.5)
WBC: 10.2 10*3/uL (ref 4.0–10.5)

## 2016-11-15 NOTE — Progress Notes (Signed)
PROGRESS NOTE    Adrian Rios  TKW:[id number] DOB: 1940-08-27 DOA: 11/11/2016 PCP: [name], MD    Brief Narrative:  Adrian Rios is a 76 y.o. 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 Patient was recently discharged 2 weeks prior from the hospitalist service for similar. In June, patient had a urinary tract infection which grew out ESBL E.Coli.  Urine cultures from the recent hospitalization 2 weeks ago Rios multiple colonies unable to be classified as well as yeast. Patient was treated as if he had ESBL Escherichia coli and discharged on Cipro of which she completed his full course.  Since then, patient has been doing okay at home. His stools have been quite loose. He says it is been on antibiotics intermittently for the past 6 months and so at times they are quite watery. He saw GI as an outpatient a week ago and at that time there was consideration for colonoscopy, but GI raised the possibility of C. difficile infection and wanted him tested for this first. (Unable to find records if this was done.) Patient started to experience fever and malaise last night so he came into the emergency room because he was worried about recurrence of infection again.     Assessment & Plan:   Principal Problem:   Recurrent urinary tract infection Active Problems:   Sarcoidosis ([place])   Hyperlipidemia   Depression   Glaucoma   Overweight (BMI 25.0-29.9)   Fever   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HCC)   History of bladder cancer   History of prostate cancer   SIRS (systemic inflammatory response syndrome) (HCC)   Malnutrition of moderate degree   Chronic diastolic CHF (congestive heart failure) ([place])   Sepsis ([place])   1-Recurrent UTI;  Last urine culture grew yeast.  Urine culture with no growth  Continue with diflucan.  ID consulted due to recurrent UTI. Recommend  urology evaluation.  Dr Margarette Canada. Recommend renogram.  lasix renogram tomorrow.  Blood culture no growth to date.  If develops night sweats again, consider getting blood culture.   2-SIRS, related to UTI; Continue with diflucan.   3-Loose stool. No watery diarrhea.  Discontinue C diff ordered.   Hypokalemia; replete orally.   Sarcoidosis ([place]): Stable Glaucoma: Continue drops Overweight (BMI 25.0-29.9): Meets criteria with BMI greater than 25 . Bladder cancer ([place]) status post ileal conduit . Malnutrition    DVT prophylaxis: Lovenox Code Status: full code.  Family Communication: Wife and son  Disposition Plan: home when stable.    Consultants:   none   Procedures: none   Antimicrobials: Other; diflucan.    Subjective: He had some night sweat last night. He is feeling ok today    Objective: Vitals:   11/14/16 2128 11/15/16 0450 11/15/16 0734 11/15/16 1420  BP: 120/66 123/65  119/73  Pulse: 73 68  76  Resp: 15 16    Temp: 99.4 F (37.4 C) 98.1 F (36.7 C)  99 F (37.2 C)  TempSrc: Oral Oral  Oral  SpO2: 96% 98% 97% 97%  Weight:  69.6 kg (153 lb 7 oz)    Height:        Intake/Output Summary (Last 24 hours) at 11/15/16 1420 Last data filed at 11/14/16 2129  Gross per 24 hour  Intake              240 ml  Output              500 ml  Net             -260 ml   Filed Weights   11/13/16 0445 11/14/16 0417 11/15/16 0450  Weight: 71.9 kg (158 lb 8.2 oz) 71.6 kg (157 lb 13.6 oz) 69.6 kg (153 lb 7 oz)    Examination:  General exam: NAD Respiratory system: CTA Cardiovascular system: S 1, S 2 RRR Gastrointestinal system: Abdomen is soft, nt Central nervous system: Non Focal.  Extremities: Symmetric power.  Skin: No rash     Data Reviewed: I have personally reviewed following labs and imaging studies  CBC:  Recent Labs Lab 11/12/16 0008 11/12/16 1342 11/13/16 0524 11/14/16 0756 11/15/16 0737  WBC 14.0* 17.2* 15.0* 11.3* 10.2    NEUTROABS 11.3*  --   --   --   --   HGB 10.2* 10.2* 9.7* 9.7* 9.8*  HCT 31.4* 30.7* 29.6* 29.5* 29.8*  MCV 85.1 82.5 84.3 84.0 83.9  PLT 502* 416* 434* 500* 448*   Basic Metabolic Panel:  Recent Labs Lab 11/12/16 0008 11/12/16 1342 11/13/16 0524 11/14/16 0756 11/15/16 0737  NA 135  --  139 139 143  K 3.6  --  3.4* 3.4* 4.1  CL 107  --  108 108 113*  CO2 21*  --  23 22 23   GLUCOSE 125*  --  107* 108* 105*  BUN 23*  --  19 17 19   CREATININE 1.13 1.23 1.29* 1.14 1.10  CALCIUM 8.9  --  8.6* 8.7* 9.2   GFR: Estimated Creatinine Clearance: 53.4 mL/min (by C-G formula based on SCr of 1.1 mg/dL). Liver Function Tests:  Recent Labs Lab 11/12/16 0008  AST 21  ALT 29  ALKPHOS 114  BILITOT 0.5  PROT 7.2  ALBUMIN 2.9*   No results for input(s): LIPASE, AMYLASE in the last 168 hours. No results for input(s): AMMONIA in the last 168 hours. Coagulation Profile: No results for input(s): INR, PROTIME in the last 168 hours. Cardiac Enzymes: No results for input(s): CKTOTAL, CKMB, CKMBINDEX, TROPONINI in the last 168 hours. BNP (last 3 results) No results for input(s): PROBNP in the last 8760 hours. HbA1C: No results for input(s): HGBA1C in the last 72 hours. CBG: No results for input(s): GLUCAP in the last 168 hours. Lipid Profile: No results for input(s): CHOL, HDL, LDLCALC, TRIG, CHOLHDL, LDLDIRECT in the last 72 hours. Thyroid Function Tests: No results for input(s): TSH, T4TOTAL, FREET4, T3FREE, THYROIDAB in the last 72 hours. Anemia Panel: No results for input(s): VITAMINB12, FOLATE, FERRITIN, TIBC, IRON, RETICCTPCT in the last 72 hours. Sepsis Labs:  Recent Labs Lab 11/12/16 0115 11/12/16 0336 11/12/16 1343  LATICACIDVEN 0.50 1.59 1.3    Recent Results (from the past 240 hour(s))  Blood Culture (routine x 2)     Status: None (Preliminary result)   Collection Time: 11/12/16 12:10 AM  Result Value Ref Range Status   Specimen Description BLOOD LEFT FOREARM   Final   Special Requests   Final    BOTTLES DRAWN AEROBIC AND ANAEROBIC Blood Culture adequate volume   Culture   Final    NO GROWTH 3 DAYS Performed at [facility], [address] [place], [place] [address]    Report Status PENDING  Incomplete  Blood Culture (routine x 2)     Status: None (Preliminary result)   Collection Time: 11/12/16  1:00 AM  Result Value Ref Range Status  Specimen Description BLOOD LEFT HAND  Final   Special Requests   Final    BOTTLES DRAWN AEROBIC AND ANAEROBIC Blood Culture adequate volume   Culture   Final    NO GROWTH 3 DAYS Performed at [facility], [address], [place], [place] [address]    Report Status PENDING  Incomplete  Culture, Urine     Status: None   Collection Time: 11/12/16 10:19 PM  Result Value Ref Range Status   Specimen Description URINE, RANDOM  Final   Special Requests NONE  Final   Culture   Final    NO GROWTH Performed at [facility], [place] [address] [place] [address] [place], [place] [address]    Report Status 11/14/2016 FINAL  Final         Radiology Studies: No results found.      Scheduled Meds: . enoxaparin (LOVENOX) injection  40 mg Subcutaneous Daily  . famotidine  10 mg Oral Daily  . fluconazole  200 mg Oral Daily  . fluticasone  1 spray Each Nare QHS  . magnesium oxide  400 mg Oral Daily  . methenamine  1,000 mg Oral BID  . mometasone-formoterol  2 puff Inhalation BID  . montelukast  10 mg Oral Daily  . protein supplement shake  11 oz Oral Q24H  . venlafaxine  75 mg Oral Daily   Continuous Infusions: . sodium chloride 75 mL/hr at 11/15/16 0703     LOS: 2 days    Time spent: 35 minutes.     Elmarie Shiley, MD [facility] Pager [phone]  If 7PM-7AM, please contact night-coverage [email or web address] Password [email or web address] 11/15/2016, 2:20 PM

## 2016-11-15 NOTE — Progress Notes (Signed)
Pt diaphoretic during shift. Required two linen changes. Temp 99.4 at beginning of shift and 98.1 this am @ 0500.

## 2016-11-16 ENCOUNTER — Inpatient Hospital Stay (HOSPITAL_COMMUNITY): Payer: Medicare Other

## 2016-11-16 MED ORDER — TECHNETIUM TC 99M MERTIATIDE
5.5000 | Freq: Once | INTRAVENOUS | Status: AC | PRN
  Administered 2016-11-16: 5.5 via INTRAVENOUS

## 2016-11-16 MED ORDER — FUROSEMIDE 10 MG/ML IJ SOLN
INTRAMUSCULAR | Status: AC
  Filled 2016-11-16: qty 4

## 2016-11-16 MED ORDER — FLUCONAZOLE 200 MG PO TABS: 200.0000 mg | ORAL_TABLET | Freq: Every day | ORAL | 0 refills | Status: DC

## 2016-11-16 MED ORDER — FUROSEMIDE 10 MG/ML IJ SOLN
35.0000 mg | Freq: Once | INTRAMUSCULAR | Status: AC
  Administered 2016-11-16: 35 mg via INTRAVENOUS

## 2016-11-16 NOTE — Progress Notes (Signed)
PROGRESS NOTE    Adrian Rios  NIO:[id number] DOB: [birth year] DOA: 11/11/2016 PCP: [name], MD    Brief Narrative:  Adrian Rios is a 76 y.o. 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 Patient was recently discharged 2 weeks prior from the hospitalist service for similar. In June, patient had a urinary tract infection which grew out ESBL E.Coli.  Urine cultures from the recent hospitalization 2 weeks ago gram multiple colonies unable to be classified as well as yeast. Patient was treated as if he had ESBL Escherichia coli and discharged on Cipro of which she completed his full course.  Since then, patient has been doing okay at home. His stools have been quite loose. He says it is been on antibiotics intermittently for the past 6 months and so at times they are quite watery. He saw GI as an outpatient a week ago and at that time there was consideration for colonoscopy, but GI raised the possibility of C. difficile infection and wanted him tested for this first. (Unable to find records if this was done.) Patient started to experience fever and malaise last night so he came into the emergency room because he was worried about recurrence of infection again.     Assessment & Plan:   Principal Problem:   Recurrent urinary tract infection Active Problems:   Sarcoidosis ([place])   Hyperlipidemia   Depression   Glaucoma   Overweight (BMI 25.0-29.9)   Fever   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HCC)   History of bladder cancer   History of prostate cancer   SIRS (systemic inflammatory response syndrome) (HCC)   Malnutrition of moderate degree   Chronic diastolic CHF (congestive heart failure) ([place])   Sepsis ([place])   1-Recurrent UTI;  Last urine culture grew yeast.  Urine culture with no growth  Continue with diflucan.  ID consulted due to recurrent UTI. Recommend  urology evaluation.  Dr Margarette Canada. Recommend renogram.  lasix renogram today. Showed decrease perfusion on the left. Plan for nephrostomy tube placement 9-18 Blood culture no growth to date.  If develops night sweats again, consider getting blood culture.   2-SIRS, related to UTI; Continue with diflucan.  Needs 5 more days.   3-Loose stool. No watery diarrhea.  Discontinue C diff ordered.   Hypokalemia; replete orally.   Sarcoidosis ([place]): Stable Glaucoma: Continue drops Overweight (BMI 25.0-29.9): Meets criteria with BMI greater than 25 . Bladder cancer ([place]) status post ileal conduit . Malnutrition    DVT prophylaxis: Lovenox Code Status: full code.  Family Communication: Wife and son  Disposition Plan: home when stable.    Consultants:   none   Procedures: none   Antimicrobials: Other; diflucan.    Subjective: No new complaints, denies night sweats.  Awaiting to go for test   Objective: Vitals:   11/15/16 1420 11/15/16 1948 11/15/16 2247 11/16/16 0559  BP: 119/73  115/67 124/70  Pulse: 76  74 61  Resp: 18  16 15   Temp: 99 F (37.2 C)  99.4 F (37.4 C) 98.1 F (36.7 C)  TempSrc: Oral  Oral Oral  SpO2: 97% 96% 98% 92%  Weight:    70.8 kg (156 lb 1.4 oz)  Height:        Intake/Output Summary (Last 24 hours) at 11/16/16 1852 Last data filed at 11/16/16 0640  Gross per 24 hour  Intake  2091.75 ml  Output             1775 ml  Net           316.75 ml   Filed Weights   11/14/16 0417 11/15/16 0450 11/16/16 0559  Weight: 71.6 kg (157 lb 13.6 oz) 69.6 kg (153 lb 7 oz) 70.8 kg (156 lb 1.4 oz)    Examination:  General exam: NAD Respiratory system: CTA Cardiovascular system: S 1, S 2 RRR Gastrointestinal system: Abdomen , soft, nt Central nervous system: Non focal.  Extremities: Symmetric power.  Skin: No rash     Data Reviewed: I have personally reviewed following labs and imaging studies  CBC:  Recent Labs Lab 11/12/16 0008  11/12/16 1342 11/13/16 0524 11/14/16 0756 11/15/16 0737  WBC 14.0* 17.2* 15.0* 11.3* 10.2  NEUTROABS 11.3*  --   --   --   --   HGB 10.2* 10.2* 9.7* 9.7* 9.8*  HCT 31.4* 30.7* 29.6* 29.5* 29.8*  MCV 85.1 82.5 84.3 84.0 83.9  PLT 502* 416* 434* 500* 510*   Basic Metabolic Panel:  Recent Labs Lab 11/12/16 0008 11/12/16 1342 11/13/16 0524 11/14/16 0756 11/15/16 0737  NA 135  --  139 139 143  K 3.6  --  3.4* 3.4* 4.1  CL 107  --  108 108 113*  CO2 21*  --  23 22 23   GLUCOSE 125*  --  107* 108* 105*  BUN 23*  --  19 17 19   CREATININE 1.13 1.23 1.29* 1.14 1.10  CALCIUM 8.9  --  8.6* 8.7* 9.2   GFR: Estimated Creatinine Clearance: 53.4 mL/min (by C-G formula based on SCr of 1.1 mg/dL). Liver Function Tests:  Recent Labs Lab 11/12/16 0008  AST 21  ALT 29  ALKPHOS 114  BILITOT 0.5  PROT 7.2  ALBUMIN 2.9*   No results for input(s): LIPASE, AMYLASE in the last 168 hours. No results for input(s): AMMONIA in the last 168 hours. Coagulation Profile: No results for input(s): INR, PROTIME in the last 168 hours. Cardiac Enzymes: No results for input(s): CKTOTAL, CKMB, CKMBINDEX, TROPONINI in the last 168 hours. BNP (last 3 results) No results for input(s): PROBNP in the last 8760 hours. HbA1C: No results for input(s): HGBA1C in the last 72 hours. CBG: No results for input(s): GLUCAP in the last 168 hours. Lipid Profile: No results for input(s): CHOL, HDL, LDLCALC, TRIG, CHOLHDL, LDLDIRECT in the last 72 hours. Thyroid Function Tests: No results for input(s): TSH, T4TOTAL, FREET4, T3FREE, THYROIDAB in the last 72 hours. Anemia Panel: No results for input(s): VITAMINB12, FOLATE, FERRITIN, TIBC, IRON, RETICCTPCT in the last 72 hours. Sepsis Labs:  Recent Labs Lab 11/12/16 0115 11/12/16 0336 11/12/16 1343  LATICACIDVEN 0.50 1.59 1.3    Recent Results (from the past 240 hour(s))  Blood Culture (routine x 2)     Status: None (Preliminary result)   Collection Time:  11/12/16 12:10 AM  Result Value Ref Range Status   Specimen Description BLOOD LEFT FOREARM  Final   Special Requests   Final    BOTTLES DRAWN AEROBIC AND ANAEROBIC Blood Culture adequate volume   Culture   Final    NO GROWTH 4 DAYS Performed at [facility], [address], [place], [place] [address]    Report Status PENDING  Incomplete  Blood Culture (routine x 2)     Status: None (Preliminary result)   Collection Time: 11/12/16  1:00 AM  Result Value Ref Range Status  Specimen Description BLOOD LEFT HAND  Final   Special Requests   Final    BOTTLES DRAWN AEROBIC AND ANAEROBIC Blood Culture adequate volume   Culture   Final    NO GROWTH 4 DAYS Performed at [facility], [address], [place], [place] [address]    Report Status PENDING  Incomplete  Culture, Urine     Status: None   Collection Time: 11/12/16 10:19 PM  Result Value Ref Range Status   Specimen Description URINE, RANDOM  Final   Special Requests NONE  Final   Culture   Final    NO GROWTH Performed at [facility], [place] [address], [place], [place] [address]    Report Status 11/14/2016 FINAL  Final         Radiology Studies: Nm Renal Imaging Flow W/pharm  Result Date: 11/16/2016 CLINICAL DATA:  Ileal diversion with left hydroureteronephrosis EXAM: NUCLEAR MEDICINE RENAL SCAN WITH DIURETIC ADMINISTRATION TECHNIQUE: Radionuclide angiographic and sequential renal images were obtained after intravenous injection of radiopharmaceutical. Imaging was continued during slow intravenous injection of Lasix approximately 15 minutes after the start of the examination. RADIOPHARMACEUTICALS:  5.5 mCi Technetium-31m MAG3 IV COMPARISON:  CT scan 10/27/2016 FINDINGS: Flow:  Asymmetrically decreased perfusion to the left kidney. Left renogram: Delayed. Right renogram: Normal. Differential: Left kidney = 28 % Right kidney = 72 % T1/2 post Lasix : Left kidney = no measurable washout Right kidney = 10 minutes min  IMPRESSION: 1. Asymmetrically decreased perfusion to the left kidney with no measurable washout. 2. Normal right renal perfusion with T1/2 post Lasix of 10 minutes. Electronically Signed   By: Misty Stanley M.D.   On: 11/16/2016 17:14        Scheduled Meds: . famotidine  10 mg Oral Daily  . fluconazole  200 mg Oral Daily  . fluticasone  1 spray Each Nare QHS  . magnesium oxide  400 mg Oral Daily  . methenamine  1,000 mg Oral BID  . mometasone-formoterol  2 puff Inhalation BID  . montelukast  10 mg Oral Daily  . protein supplement shake  11 oz Oral Q24H  . venlafaxine  75 mg Oral Daily   Continuous Infusions: . sodium chloride 75 mL/hr at 11/16/16 0808     LOS: 3 days    Time spent: 35 minutes.     Elmarie Shiley, MD [facility] Pager [phone]  If 7PM-7AM, please contact night-coverage [email or web address] Password [facility] 11/16/2016, 6:52 PM

## 2016-11-16 NOTE — Care Management Important Message (Signed)
Important Message  Patient Details  Name: Adrian Rios MRN: [id number] Date of Birth: December 18, 1940   Medicare Important Message Given:  Yes    Kerin Salen 11/16/2016, 9:43 AMImportant Message  Patient Details  Name: Adrian Rios MRN: [id number] Date of Birth: Feb 08, 1941   Medicare Important Message Given:  Yes    Kerin Salen 11/16/2016, 9:43 AM

## 2016-11-16 NOTE — Progress Notes (Signed)
Patient ID: Adrian Rios, male   DOB: 1940/09/11, 76 y.o.   MRN: [id number]          [facility]  Date of Admission:  11/11/2016           Day 5 fluconazole ASSESSMENT: His urine culture was negative but he has improved and defervesced on therapy for presumed fungal UTI. His last urine culture grew Candida. He is scheduled for a Lasix renogram of this afternoon to look for any evidence of acute obstruction.  PLAN: 1. Recommend 5 more days of oral fluconazole  Principal Problem:   Recurrent urinary tract infection Active Problems:   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HCC)   History of bladder cancer   History of prostate cancer   Sarcoidosis (HCC)   Hyperlipidemia   Depression   Glaucoma   Overweight (BMI 25.0-29.9)   Fever   SIRS (systemic inflammatory response syndrome) (HCC)   Malnutrition of moderate degree   Chronic diastolic CHF (congestive heart failure) ([place])   Sepsis ([place])   . enoxaparin (LOVENOX) injection  40 mg Subcutaneous Daily  . famotidine  10 mg Oral Daily  . fluconazole  200 mg Oral Daily  . fluticasone  1 spray Each Nare QHS  . magnesium oxide  400 mg Oral Daily  . methenamine  1,000 mg Oral BID  . mometasone-formoterol  2 puff Inhalation BID  . montelukast  10 mg Oral Daily  . protein supplement shake  11 oz Oral Q24H  . venlafaxine  75 mg Oral Daily    SUBJECTIVE: He is feeling much better. His left flank pain has resolved. He did not have any night sweats or fever last night.  Review of Systems: Review of Systems  Constitutional: Positive for malaise/fatigue and weight loss. Negative for chills, diaphoresis and fever.  Gastrointestinal: Negative for abdominal pain, diarrhea, nausea and vomiting.    Allergies  Allergen Reactions  . Ivp Dye [Iodinated Diagnostic Agents] Shortness Of Breath, Itching and Palpitations    "eyes itching,  Heart racing,  Effected breathing" 10-27-16 pt with 13 hr pre-meds for CT without any reaction-kj    . Shellfish Allergy Hives, Shortness Of Breath and Itching    Mostly crab  . Gabapentin Other (See Comments)    REACTION: dizziness and flushing  . Niacin-Lovastatin Er Other (See Comments)    Did not feel good on medication  . Adhesive [Tape] Rash    Use paper tape    OBJECTIVE: Vitals:   11/15/16 1420 11/15/16 1948 11/15/16 2247 11/16/16 0559  BP: 119/73  115/67 124/70  Pulse: 76  74 61  Resp: 18  16 15   Temp: 99 F (37.2 C)  99.4 F (37.4 C) 98.1 F (36.7 C)  TempSrc: Oral  Oral Oral  SpO2: 97% 96% 98% 92%  Weight:    156 lb 1.4 oz (70.8 kg)  Height:       Body mass index is 24.45 kg/m.  Physical Exam  Constitutional: He is oriented to person, place, and time.  He is resting quietly in bed. He appears comfortable. He is eager to have his test today and then hopefully, go home.  Abdominal: Soft. He exhibits no distension. There is no tenderness.  Neurological: He is alert and oriented to person, place, and time.  Psychiatric: Mood and affect normal.    Lab Results Lab Results  Component Value Date   WBC 10.2 11/15/2016   HGB 9.8 (L) 11/15/2016   HCT 29.8 (L) 11/15/2016  MCV 83.9 11/15/2016   PLT 545 (H) 11/15/2016    Lab Results  Component Value Date   CREATININE 1.10 11/15/2016   BUN 19 11/15/2016   NA 143 11/15/2016   K 4.1 11/15/2016   CL 113 (H) 11/15/2016   CO2 23 11/15/2016    Lab Results  Component Value Date   ALT 29 11/12/2016   AST 21 11/12/2016   ALKPHOS 114 11/12/2016   BILITOT 0.5 11/12/2016     Microbiology: Recent Results (from the past 240 hour(s))  Blood Culture (routine x 2)     Status: None (Preliminary result)   Collection Time: 11/12/16 12:10 AM  Result Value Ref Range Status   Specimen Description BLOOD LEFT FOREARM  Final   Special Requests   Final    BOTTLES DRAWN AEROBIC AND ANAEROBIC Blood Culture adequate volume   Culture   Final    NO GROWTH 3 DAYS Performed at [facility], [place] [address]  [place], [place] [address]    Report Status PENDING  Incomplete  Blood Culture (routine x 2)     Status: None (Preliminary result)   Collection Time: 11/12/16  1:00 AM  Result Value Ref Range Status   Specimen Description BLOOD LEFT HAND  Final   Special Requests   Final    BOTTLES DRAWN AEROBIC AND ANAEROBIC Blood Culture adequate volume   Culture   Final    NO GROWTH 3 DAYS Performed at [facility], [place] [address], [place], [place] [address]    Report Status PENDING  Incomplete  Culture, Urine     Status: None   Collection Time: 11/12/16 10:19 PM  Result Value Ref Range Status   Specimen Description URINE, RANDOM  Final   Special Requests NONE  Final   Culture   Final    NO GROWTH Performed at [facility], [place] [address] [place], [place] [address]    Report Status 11/14/2016 FINAL  Final    Michel Bickers, MD [facility] [phone] pager   [phone] cell 11/16/2016, 10:17 AM

## 2016-11-17 ENCOUNTER — Encounter (HOSPITAL_COMMUNITY): Payer: Self-pay | Admitting: General Surgery

## 2016-11-17 LAB — CULTURE, BLOOD (ROUTINE X 2)
CULTURE: NO GROWTH
Culture: NO GROWTH
Special Requests: ADEQUATE
Special Requests: ADEQUATE

## 2016-11-17 LAB — URINALYSIS, ROUTINE W REFLEX MICROSCOPIC
BILIRUBIN URINE: NEGATIVE
GLUCOSE, UA: NEGATIVE mg/dL
Ketones, ur: NEGATIVE mg/dL
NITRITE: POSITIVE — AB
PH: 6 (ref 5.0–8.0)
Protein, ur: NEGATIVE mg/dL
SPECIFIC GRAVITY, URINE: 1.009 (ref 1.005–1.030)

## 2016-11-17 LAB — CBC
HCT: 27.3 % — ABNORMAL LOW (ref 39.0–52.0)
HEMOGLOBIN: 9 g/dL — AB (ref 13.0–17.0)
MCH: 27.5 pg (ref 26.0–34.0)
MCHC: 33 g/dL (ref 30.0–36.0)
MCV: 83.5 fL (ref 78.0–100.0)
Platelets: 487 10*3/uL — ABNORMAL HIGH (ref 150–400)
RBC: 3.27 MIL/uL — ABNORMAL LOW (ref 4.22–5.81)
RDW: 14.6 % (ref 11.5–15.5)
WBC: 14.5 10*3/uL — ABNORMAL HIGH (ref 4.0–10.5)

## 2016-11-17 MED ORDER — PREDNISONE 50 MG PO TABS
50.0000 mg | ORAL_TABLET | Freq: Once | ORAL | Status: AC
  Administered 2016-11-18: 50 mg via ORAL
  Filled 2016-11-17: qty 1

## 2016-11-17 MED ORDER — PREDNISONE 50 MG PO TABS
50.0000 mg | ORAL_TABLET | Freq: Once | ORAL | Status: AC
  Administered 2016-11-17: 50 mg via ORAL
  Filled 2016-11-17: qty 1

## 2016-11-17 MED ORDER — DIPHENHYDRAMINE HCL 50 MG PO CAPS
50.0000 mg | ORAL_CAPSULE | Freq: Once | ORAL | Status: AC
  Administered 2016-11-18: 50 mg via ORAL
  Filled 2016-11-17: qty 1

## 2016-11-17 MED ORDER — DEXTROSE 5 % IV SOLN
1.0000 g | Freq: Once | INTRAVENOUS | Status: AC
  Administered 2016-11-17: 1 g via INTRAVENOUS
  Filled 2016-11-17: qty 1

## 2016-11-17 MED ORDER — PREDNISONE 50 MG PO TABS: 50.0000 mg | ORAL_TABLET | Freq: Once | ORAL | Status: DC

## 2016-11-17 MED ORDER — DIPHENHYDRAMINE HCL 50 MG PO CAPS: 50.0000 mg | ORAL_CAPSULE | Freq: Once | ORAL | Status: DC

## 2016-11-17 NOTE — Progress Notes (Signed)
Imaging in review with Dr. Kathlene Cote, but assuming we can move forward with this procedure, we will not be able to do this today as this procedure requires the use of contrast, for which the patient is allergic.  He will have to get a 13 hrs prep prior to being able to do this.  We will tentatively plan this for 1330 on 9/19.  He may eat today.  Orders will be placed for procedure by IR for tomorrow.    Adrian Rios E 8:34 AM 11/17/2016

## 2016-11-17 NOTE — Progress Notes (Signed)
Nutrition Brief Note   Wt Readings from Last 15 Encounters:  11/17/16 155 lb 9.6 oz (70.6 kg)  11/04/16 160 lb 8 oz (72.8 kg)  10/29/16 165 lb (74.8 kg)  10/29/16 162 lb (73.5 kg)  10/26/16 162 lb 0.6 oz (73.5 kg)  10/19/16 162 lb 9.6 oz (73.8 kg)  10/10/16 160 lb (72.6 kg)  09/08/16 160 lb (72.6 kg)  08/19/16 159 lb (72.1 kg)  08/04/16 160 lb 15 oz (73 kg)  07/23/16 162 lb 3.2 oz (73.6 kg)  06/26/16 165 lb (74.8 kg)  06/23/16 165 lb 6.4 oz (75 kg)  06/11/16 173 lb 4.8 oz (78.6 kg)  05/16/16 174 lb 12.8 oz (79.3 kg)    Body mass index is 24.37 kg/m. Patient meets criteria for normal weight based on current BMI.   Brief note written by this RD on 9/13. Pt with good appetite and an attempt to focus on protein intake PTA despite weight loss. No muscle or fat wasting was shown during nutrition-focused physical assessment. Pt does not meet criteria for malnutrition at this time. He has hx of malnutrition dx but this has resolved as of this time.   He has been on a Heart Healthy diet, prior to being made NPO at midnight, but does not like the food here. Family and other visitors have been bringing in meals for pt and he has mainly been consuming 100% of these items.   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 Pt also with moderate risk prostate cancer. Urology is recommending L nephrostomy tube and then okay to d/c home.   No nutrition-related needs. Will sign off at this time. If nutrition needs arise, please consult RD.     Jarome Matin, MS, RD, LDN, [facility] In[facility] Clinical Dietitian Pager # [phone] After hours/weekend pager # [phone]

## 2016-11-17 NOTE — Progress Notes (Signed)
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 Margins negative. Urethra remains in situ.   Post-op Surveillance:  - 09/2016 CT - no recurrence.   2 - Bilateral NON-Complex Renal Cysts - left peripelvic cysts w/o hydro or mass effect and right <1cm cortical cysts x 2 on hematuria CT 04/2016. No mass effect / enhancing nodules / or coarse calcifications.   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   5- Recurrent Pyelonephritis / /  Left Ureteral Obstruction- few episodes febrile UTI following cystectomy 05/2016, including yeast as well as coliforms with variable resistance, (previously e. Coli res to all but zosyn, cipro, merrem, nitro). CT 09/2016 w/o signifcant obstruction / stones / abscesses, just chronic left caliectasis. He does have some chronic left mild hydro that pre-dates his cystectomy making evaluation for new obstruction difficult. Renogram this admission with excellent Rt excretion by very limited Lt suggestive of obstruction.     Today " Adrian Rios " is stable. We discussed renogram results yesterday including rec for left neph tube prior to discharge this admission.    Objective: Vital signs in last 24 hours: Temp:  [98.1 F (36.7 C)-99.6 F (37.6 C)] 98.1 F (36.7 C) (09/18 0609) Pulse Rate:  [75-78] 75 (09/18 0609) Resp:  [18] 18 (09/18 0609) BP: (118-120)/(68-83) 118/83 (09/18 0609) SpO2:  [93 %-97 %] 97 % (09/18 0609) Weight:  [70.6 kg (155 lb 9.6 oz)] 70.6 kg (155 lb 9.6 oz) (09/18 0609) Last BM Date: 11/15/16  Intake/Output from previous  day: 09/17 0701 - 09/18 0700 In: 1897.5 [P.O.:400; I.V.:1497.5] Out: -  Intake/Output this shift: No intake/output data recorded.  General appearance: alert and cooperative Eyes: negative Nose: Nares normal. Septum midline. Mucosa normal. No drainage or sinus tenderness. Throat: lips, mucosa, and tongue normal; teeth and gums normal Neck: supple, symmetrical, trachea midline Back: symmetric, no curvature. ROM normal. No CVA tenderness. Resp: non-labored on room air.  Cardio: Nl rate GI: soft, non-tender; bowel sounds normal; no masses,  no organomegaly and RLQ Urostomy pink / patent.  Extremities: extremities normal, atraumatic, no cyanosis or edema Pulses: 2+ and symmetric Skin: Skin color, texture, turgor normal. No rashes or lesions  Lab Results:   Recent Labs  11/14/16 0756 11/15/16 0737  WBC 11.3* 10.2  HGB 9.7* 9.8*  HCT 29.5* 29.8*  PLT 500* 545*   BMET  Recent Labs  11/14/16 0756 11/15/16 0737  NA 139 143  K 3.4* 4.1  CL 108 113*  CO2 22 23  GLUCOSE 108* 105*  BUN 17 19  CREATININE 1.14 1.10  CALCIUM 8.7* 9.2   PT/INR No results for input(s): LABPROT, INR in the last 72 hours. ABG No results for input(s): PHART, HCO3 in the last 72 hours.  Invalid input(s): PCO2, PO2  Studies/Results: Nm Renal Imaging Flow W/pharm  Result Date: 11/16/2016 CLINICAL DATA:  Ileal diversion with left hydroureteronephrosis EXAM: NUCLEAR MEDICINE RENAL SCAN WITH DIURETIC ADMINISTRATION TECHNIQUE: Radionuclide angiographic and sequential renal images were obtained after intravenous injection of radiopharmaceutical. Imaging was continued during slow intravenous injection of Lasix approximately 15 minutes after the start of the examination. RADIOPHARMACEUTICALS:  5.5 mCi Technetium-73m MAG3 IV COMPARISON:  CT  scan 10/27/2016 FINDINGS: Flow:  Asymmetrically decreased perfusion to the left kidney. Left renogram: Delayed. Right renogram: Normal. Differential: Left kidney = 28 %  Right kidney = 72 % T1/2 post Lasix : Left kidney = no measurable washout Right kidney = 10 minutes min IMPRESSION: 1. Asymmetrically decreased perfusion to the left kidney with no measurable washout. 2. Normal right renal perfusion with T1/2 post Lasix of 10 minutes. Electronically Signed   By: Misty Stanley M.D.   On: 11/16/2016 17:14    Anti-infectives: Anti-infectives    Start     Dose/Rate Route Frequency Ordered Stop   11/17/16 0000  fluconazole (DIFLUCAN) 200 MG tablet     200 mg Oral Daily 11/16/16 1617     11/13/16 1100  fluconazole (DIFLUCAN) tablet 200 mg     200 mg Oral Daily 11/13/16 1000     11/12/16 1200  fluconazole (DIFLUCAN) IVPB 100 mg  Status:  Discontinued     100 mg 50 mL/hr over 60 Minutes Intravenous Every 24 hours 11/12/16 1022 11/12/16 1028   11/12/16 1200  fluconazole (DIFLUCAN) IVPB 100 mg  Status:  Discontinued     100 mg 50 mL/hr over 60 Minutes Intravenous Every 24 hours 11/12/16 1028 11/12/16 1029   11/12/16 1200  fluconazole (DIFLUCAN) IVPB 200 mg  Status:  Discontinued     200 mg 100 mL/hr over 60 Minutes Intravenous Every 24 hours 11/12/16 1029 11/13/16 1000   11/12/16 0600  meropenem (MERREM) 1 g in sodium chloride 0.9 % 100 mL IVPB  Status:  Discontinued     1 g 200 mL/hr over 30 Minutes Intravenous Every 8 hours 11/12/16 0544 11/12/16 1022   11/12/16 0100  piperacillin-tazobactam (ZOSYN) IVPB 3.375 g     3.375 g 100 mL/hr over 30 Minutes Intravenous  Once 11/12/16 0045 11/12/16 0503   11/12/16 0100  vancomycin (VANCOCIN) IVPB 1000 mg/200 mL premix     1,000 mg 200 mL/hr over 60 Minutes Intravenous  Once 11/12/16 0045 11/12/16 0503      Assessment/Plan:  1 - Bladder Cancer - no evidence of recurrent disease by imaging on last CT in 09/2016, he will be receiving repeat surveillance eval in about 50mo.   2 - Bilateral NON-Complex Renal Cysts - stable, No indication for furhter evaluation or treatment.   3- Moderate Risk Prostate Cancer - PSA at  next cancer surveillance visit in about 1 mos  4 - Urinary Diversion - working well with normal GFR. He is facile with urostomy changes.   5- Pyelonephritis / Left Ureteral Obstrution-  left neph tube ordered for likely obstruction. Explained this will be temporixing and he will need antegrade ureteroscopy in elective setting to r/o obstruction and treat any mild obstruction v. Open revision if high grade / refracotry obstruction noted.   I am OK with DC home following neph tube today.   He has GU follow up with me on 10/4 in office.    [facility], Zyliah Schier 11/17/2016

## 2016-11-17 NOTE — Consult Note (Signed)
Chief Complaint: left hydronephrosis  Referring Physician:Dr. Alexis Frock  Supervising Physician: Aletta Edouard  Patient Status: [facility] - In-pt  HPI: Adrian Rios is a 76 y.o. male with a history of bladder cancer who underwent a cystoprostatectomy with pelvic lymphadenectomy and ileal conduit urinary diversion 05/2016.  He has been admitted multiple times since then for recurrent pyelonephritis.  He was admitted again this time for the same thing.  He has undergone a renogram which reveals very limited excretion by the left suggestive of an obstruction.  He has hydronephrosis noted on his CT scan.  IR has been asked to place a L PCN to relieve this obstruction.  Past Medical History:  Past Medical History:  Diagnosis Date  . Allergic rhinitis   . Arthritis   . Benign localized prostatic hyperplasia with lower urinary tract symptoms (LUTS)   . Bladder tumor   . Borderline glaucoma of right eye   . Cancer [facility])    bladder and prostate  . Carotid bruit    per duplex 03-11-2016 RICA 1-39%  . Chronic throat clearing   . DDD (degenerative disc disease), lumbar   . Depression   . ED (erectile dysfunction)   . Elevated PSA    urologist-  dr [name]--- s/p  prostate bx's  . GERD (gastroesophageal reflux disease)   . Hematuria   . History of chronic bronchitis   . 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 History of squamous cell carcinoma excision    several times  . Hyperlipidemia   . Pre-diabetes   . Premature ventricular contractions (PVCs) (VPCs)   . RAD (reactive airway disease)   . 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 Sensorineural hearing loss (SNHL) of both ears   . Sepsis ([place]) 09/2016  . UTI (urinary tract infection) 08/2016  . Wears glasses   . Wears hearing aid    bilateral    Past Surgical History:  Past Surgical History:  Procedure  Laterality Date  . APPENDECTOMY  2008  . CARDIOVASCULAR STRESS TEST  05/27/2010   normal nuclear study w/ no ischemia/  normal LV function and wall motion , ef 60%  . CYSTOSCOPY WITH INJECTION N/A 06/12/2016   Procedure: CYSTOSCOPY WITH INJECTION OF INDOCYANINE GREEN DYE;  Surgeon: Alexis Frock, MD;  Location: [facility];  Service: Urology;  Laterality: N/A;  . DEEP NECK LYMPH NODE BIOPSY / EXCISION  1970's   and Bronchoscopy w/ bx's ( dx Sarcoidosis)  . ILEOSTOMY    . MOHS SURGERY  2013 approx.    nasal; pre melanoma  . PARS PLANA VITRECTOMY Right 2007   repair macular pucker  . TONSILLECTOMY AND ADENOIDECTOMY  child  . TRANSURETHRAL RESECTION OF BLADDER TUMOR N/A 04/06/2016   Procedure: CYSTOSCOPY TRANSURETHRAL RESECTION OF BLADDER TUMORS (TURBT);  Surgeon: Carolan Clines, MD;  Location: [facility];  Service: Urology;  Laterality: N/A;  . TRANSURETHRAL RESECTION OF PROSTATE      Family History:  Family History  Problem Relation Age of Onset  . Stroke Mother        in her 15s  . Breast cancer Sister   . Heart disease Maternal Grandmother 84       MI   . Breast cancer Paternal Grandmother   . Heart disease Paternal Grandfather 45  MI  . Stroke Brother   . Diabetes Neg Hx     Social History:  reports that he has never smoked. He has never used smokeless tobacco. He reports that he does not drink alcohol or use drugs.  Allergies:  Allergies  Allergen Reactions  . Ivp Dye [Iodinated Diagnostic Agents] Shortness Of Breath, Itching and Palpitations    "eyes itching,  Heart racing,  Effected breathing" 10-27-16 pt with 13 hr pre-meds for CT without any reaction-kj  . Shellfish Allergy Hives, Shortness Of Breath and Itching    Mostly crab  . Gabapentin Other (See Comments)    REACTION: dizziness and flushing  . Niacin-Lovastatin Er Other (See Comments)    Did not feel good on medication  . Adhesive [Tape] Rash    Use paper tape     Medications: Medications reviewed in epic  Please HPI for pertinent positives, otherwise complete 10 system ROS negative.  Mallampati Score: MD Evaluation Airway: WNL Heart: WNL Abdomen: Other (comments) Abdomen comments: ileal conduit in place Chest/ Lungs: WNL ASA  Classification: 3 Mallampati/Airway Score: Two  Physical Exam: BP 118/83 (BP Location: Right Arm)   Pulse 75   Temp 98.1 F (36.7 C) (Oral)   Resp 18   Ht 5\' 7"  (1.702 m)   Wt 155 lb 9.6 oz (70.6 kg)   SpO2 95%   BMI 24.37 kg/m  Body mass index is 24.37 kg/m. General: pleasant, WD, WN white male who is laying in bed in NAD HEENT: head is normocephalic, atraumatic.  Sclera are noninjected.  PERRL.  Ears and nose without any masses or lesions.  Mouth is pink and moist Heart: regular, rate, and rhythm.  Normal s1,s2. No obvious murmurs, gallops, or rubs noted.  Palpable radial and pedal pulses bilaterally Lungs: CTAB, no wheezes, rhonchi, or rales noted.  Respiratory effort nonlabored Abd: soft, NT, ND, +BS, ileal conduit in place in RLQ, no masses, hernias, or organomegaly Psych: A&Ox3 with an appropriate affect.   Labs: Recent labs reviewed   Imaging: Nm Renal Imaging Flow W/pharm  Result Date: 11/16/2016 CLINICAL DATA:  Ileal diversion with left hydroureteronephrosis EXAM: NUCLEAR MEDICINE RENAL SCAN WITH DIURETIC ADMINISTRATION TECHNIQUE: Radionuclide angiographic and sequential renal images were obtained after intravenous injection of radiopharmaceutical. Imaging was continued during slow intravenous injection of Lasix approximately 15 minutes after the start of the examination. RADIOPHARMACEUTICALS:  5.5 mCi Technetium-58m MAG3 IV COMPARISON:  CT scan 10/27/2016 FINDINGS: Flow:  Asymmetrically decreased perfusion to the left kidney. Left renogram: Delayed. Right renogram: Normal. Differential: Left kidney = 28 % Right kidney = 72 % T1/2 post Lasix : Left kidney = no measurable washout Right kidney = 10  minutes min IMPRESSION: 1. Asymmetrically decreased perfusion to the left kidney with no measurable washout. 2. Normal right renal perfusion with T1/2 post Lasix of 10 minutes. Electronically Signed   By: Misty Stanley M.D.   On: 11/16/2016 17:14    Assessment/Plan 1. Left hydronephrosis  We will plan to proceed with left PCN placement tomorrow around 1030am.  He has a dye allergy so he is being premedicated today in preparation for his procedure tomorrow.  He will be NPO p MN as well.   Risks and benefits of left PCN placement was discussed with the patient including, but not limited to, infection, bleeding, significant bleeding causing loss or decrease in renal function or damage to adjacent structures.   All of the patient's questions were answered, patient is agreeable to proceed.  Consent signed and  in chart.   Thank you for this interesting consult.  I greatly enjoyed meeting TANIA PERROTT and look forward to participating in their care.  A copy of this report was sent to the requesting provider on this date.  Electronically Signed: Henreitta Cea 11/17/2016, 2:56 PM   I spent a total of 40 Minutes    in face to face in clinical consultation, greater than 50% of which was counseling/coordinating care for left hydronephrosis

## 2016-11-17 NOTE — Progress Notes (Signed)
PROGRESS NOTE    Adrian Rios  XBM:[id number] DOB: 01-07-1941 DOA: 11/11/2016 PCP: [name], MD    Brief Narrative:  Adrian Rios is a 76 y.o. 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 Patient was recently discharged 2 weeks prior from the hospitalist service for similar. In June, patient had a urinary tract infection which grew out ESBL E.Coli.  Urine cultures from the recent hospitalization 2 weeks ago gram multiple colonies unable to be classified as well as yeast. Patient was treated as if he had ESBL Escherichia coli and discharged on Cipro of which she completed his full course.  Since then, patient has been doing okay at home. His stools have been quite loose. He says it is been on antibiotics intermittently for the past 6 months and so at times they are quite watery. He saw GI as an outpatient a week ago and at that time there was consideration for colonoscopy, but GI raised the possibility of C. difficile infection and wanted him tested for this first. (Unable to find records if this was done.) Patient started to experience fever and malaise last night so he came into the emergency room because he was worried about recurrence of infection again.  Admitted with recurrent UTI, urology and ID consulted. He has been getting treatment for yeast UTI with diflucan. A CT renogram was done which showed low perfusion left kidney. Dr Tresa Moore is recommending Nephrostomy tube placement prior to discharge. [atient will need to be pre-medicated for this due to contrast allergy    Assessment & Plan:   Principal Problem:   Recurrent urinary tract infection Active Problems:   Sarcoidosis ([place])   Hyperlipidemia   Depression   Glaucoma   Overweight (BMI 25.0-29.9)   Fever   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HCC)   History of bladder cancer   History of prostate cancer   SIRS  (systemic inflammatory response syndrome) (HCC)   Malnutrition of moderate degree   Chronic diastolic CHF (congestive heart failure) ([place])   Sepsis ([place])   1-Recurrent UTI;  Last urine culture grew yeast.  Urine culture with no growth  Continue with diflucan. Needs 5 more days, from 9-17 ID consulted due to recurrent UTI. Recommend urology evaluation.  Dr Margarette Canada. Recommend renogram.  lasix renogram : . Showed decrease perfusion on the left. Plan for nephrostomy tube placement 9-19. Needs premedications.  Blood culture no growth to date.  If develops night sweats again, consider getting blood culture.   2-SIRS, related to UTI; Continue with diflucan.    3-Loose stool. No watery diarrhea.  Discontinue C diff ordered.   Hypokalemia; replete orally.   Sarcoidosis ([place]): Stable Glaucoma: Continue drops Overweight (BMI 25.0-29.9): Meets criteria with BMI greater than 25 . Bladder cancer ([place]) status post ileal conduit . Malnutrition    DVT prophylaxis: Lovenox Code Status: full code.  Family Communication: Wife and son  Disposition Plan: home when stable.    Consultants:   none   Procedures: none   Antimicrobials: Other; diflucan.    Subjective: He was very upset because nephrostomy tube was not going to be place today    Objective: Vitals:   11/16/16 1941 11/16/16 2254 11/17/16 0609 11/17/16 1030  BP:  120/68 118/83   Pulse:  78 75   Resp:  18 18   Temp:  99.6 F (37.6 C) 98.1 F (36.7  C)   TempSrc:  Oral Oral   SpO2: 93% 95% 97% 95%  Weight:   70.6 kg (155 lb 9.6 oz)   Height:        Intake/Output Summary (Last 24 hours) at 11/17/16 1424 Last data filed at 11/17/16 1324  Gross per 24 hour  Intake           1297.5 ml  Output                0 ml  Net           1297.5 ml   Filed Weights   11/15/16 0450 11/16/16 0559 11/17/16 0609  Weight: 69.6 kg (153 lb 7 oz) 70.8 kg (156 lb 1.4 oz) 70.6 kg (155 lb 9.6 oz)    Examination:  General  exam: NAD Respiratory system: CTA Cardiovascular system: S 1, S 2 RRR Gastrointestinal system: Abdomen , soft, nt Central nervous system: Non focal.  Extremities: Symmetric power.  Skin: No rash     Data Reviewed: I have personally reviewed following labs and imaging studies  CBC:  Recent Labs Lab 11/12/16 0008 11/12/16 1342 11/13/16 0524 11/14/16 0756 11/15/16 0737  WBC 14.0* 17.2* 15.0* 11.3* 10.2  NEUTROABS 11.3*  --   --   --   --   HGB 10.2* 10.2* 9.7* 9.7* 9.8*  HCT 31.4* 30.7* 29.6* 29.5* 29.8*  MCV 85.1 82.5 84.3 84.0 83.9  PLT 502* 416* 434* 500* 401*   Basic Metabolic Panel:  Recent Labs Lab 11/12/16 0008 11/12/16 1342 11/13/16 0524 11/14/16 0756 11/15/16 0737  NA 135  --  139 139 143  K 3.6  --  3.4* 3.4* 4.1  CL 107  --  108 108 113*  CO2 21*  --  23 22 23   GLUCOSE 125*  --  107* 108* 105*  BUN 23*  --  19 17 19   CREATININE 1.13 1.23 1.29* 1.14 1.10  CALCIUM 8.9  --  8.6* 8.7* 9.2   GFR: Estimated Creatinine Clearance: 53.4 mL/min (by C-G formula based on SCr of 1.1 mg/dL). Liver Function Tests:  Recent Labs Lab 11/12/16 0008  AST 21  ALT 29  ALKPHOS 114  BILITOT 0.5  PROT 7.2  ALBUMIN 2.9*   No results for input(s): LIPASE, AMYLASE in the last 168 hours. No results for input(s): AMMONIA in the last 168 hours. Coagulation Profile: No results for input(s): INR, PROTIME in the last 168 hours. Cardiac Enzymes: No results for input(s): CKTOTAL, CKMB, CKMBINDEX, TROPONINI in the last 168 hours. BNP (last 3 results) No results for input(s): PROBNP in the last 8760 hours. HbA1C: No results for input(s): HGBA1C in the last 72 hours. CBG: No results for input(s): GLUCAP in the last 168 hours. Lipid Profile: No results for input(s): CHOL, HDL, LDLCALC, TRIG, CHOLHDL, LDLDIRECT in the last 72 hours. Thyroid Function Tests: No results for input(s): TSH, T4TOTAL, FREET4, T3FREE, THYROIDAB in the last 72 hours. Anemia Panel: No results for  input(s): VITAMINB12, FOLATE, FERRITIN, TIBC, IRON, RETICCTPCT in the last 72 hours. Sepsis Labs:  Recent Labs Lab 11/12/16 0115 11/12/16 0336 11/12/16 1343  LATICACIDVEN 0.50 1.59 1.3    Recent Results (from the past 240 hour(s))  Blood Culture (routine x 2)     Status: None (Preliminary result)   Collection Time: 11/12/16 12:10 AM  Result Value Ref Range Status   Specimen Description BLOOD LEFT FOREARM  Final   Special Requests   Final    BOTTLES DRAWN AEROBIC AND  ANAEROBIC Blood Culture adequate volume   Culture   Final    NO GROWTH 4 DAYS Performed at [facility] [address] [place], [place] [address]    Report Status PENDING  Incomplete  Blood Culture (routine x 2)     Status: None (Preliminary result)   Collection Time: 11/12/16  1:00 AM  Result Value Ref Range Status   Specimen Description BLOOD LEFT HAND  Final   Special Requests   Final    BOTTLES DRAWN AEROBIC AND ANAEROBIC Blood Culture adequate volume   Culture   Final    NO GROWTH 4 DAYS Performed at [facility], [place] [address] [place], [place] [address]    Report Status PENDING  Incomplete  Culture, Urine     Status: None   Collection Time: 11/12/16 10:19 PM  Result Value Ref Range Status   Specimen Description URINE, RANDOM  Final   Special Requests NONE  Final   Culture   Final    NO GROWTH Performed at [facility], [place] [address], [place], [place] [address]    Report Status 11/14/2016 FINAL  Final         Radiology Studies: Nm Renal Imaging Flow W/pharm  Result Date: 11/16/2016 CLINICAL DATA:  Ileal diversion with left hydroureteronephrosis EXAM: NUCLEAR MEDICINE RENAL SCAN WITH DIURETIC ADMINISTRATION TECHNIQUE: Radionuclide angiographic and sequential renal images were obtained after intravenous injection of radiopharmaceutical. Imaging was continued during slow intravenous injection of Lasix approximately 15 minutes after the start of the examination.  RADIOPHARMACEUTICALS:  5.5 mCi Technetium-26m MAG3 IV COMPARISON:  CT scan 10/27/2016 FINDINGS: Flow:  Asymmetrically decreased perfusion to the left kidney. Left renogram: Delayed. Right renogram: Normal. Differential: Left kidney = 28 % Right kidney = 72 % T1/2 post Lasix : Left kidney = no measurable washout Right kidney = 10 minutes min IMPRESSION: 1. Asymmetrically decreased perfusion to the left kidney with no measurable washout. 2. Normal right renal perfusion with T1/2 post Lasix of 10 minutes. Electronically Signed   By: Misty Stanley M.D.   On: 11/16/2016 17:14        Scheduled Meds: . [START ON 11/18/2016] diphenhydrAMINE  50 mg Oral Once  . famotidine  10 mg Oral Daily  . fluconazole  200 mg Oral Daily  . fluticasone  1 spray Each Nare QHS  . magnesium oxide  400 mg Oral Daily  . methenamine  1,000 mg Oral BID  . mometasone-formoterol  2 puff Inhalation BID  . montelukast  10 mg Oral Daily  . predniSONE  50 mg Oral Once  . [START ON 11/18/2016] predniSONE  50 mg Oral Once  . [START ON 11/18/2016] predniSONE  50 mg Oral Once  . protein supplement shake  11 oz Oral Q24H  . venlafaxine  75 mg Oral Daily   Continuous Infusions: . sodium chloride 75 mL/hr at 11/16/16 2058     LOS: 4 days    Time spent: 35 minutes.     Elmarie Shiley, MD [facility] Pager [phone]  If 7PM-7AM, please contact night-coverage [email or web address] Password [facility] 11/17/2016, 2:24 PM

## 2016-11-18 ENCOUNTER — Encounter: Payer: Self-pay | Admitting: Internal Medicine

## 2016-11-18 ENCOUNTER — Inpatient Hospital Stay (HOSPITAL_COMMUNITY): Payer: Medicare Other

## 2016-11-18 ENCOUNTER — Other Ambulatory Visit: Payer: Self-pay | Admitting: Internal Medicine

## 2016-11-18 DIAGNOSIS — J209 Acute bronchitis, unspecified: Secondary | ICD-10-CM

## 2016-11-18 DIAGNOSIS — N390 Urinary tract infection, site not specified: Secondary | ICD-10-CM

## 2016-11-18 HISTORY — PX: IR NEPHROSTOMY PLACEMENT LEFT: IMG6063

## 2016-11-18 MED ORDER — HYDROCODONE-ACETAMINOPHEN 7.5-325 MG PO TABS
1.0000 | ORAL_TABLET | Freq: Once | ORAL | Status: AC
  Administered 2016-11-18: 1 via ORAL
  Filled 2016-11-18: qty 1

## 2016-11-18 MED ORDER — HYDROCODONE-ACETAMINOPHEN 5-325 MG PO TABS: 1.0000 | ORAL_TABLET | Freq: Four times a day (QID) | ORAL | 0 refills | Status: DC | PRN

## 2016-11-18 MED ORDER — FENTANYL CITRATE (PF) 100 MCG/2ML IJ SOLN
INTRAMUSCULAR | Status: AC
  Filled 2016-11-18: qty 4

## 2016-11-18 MED ORDER — IOPAMIDOL (ISOVUE-300) INJECTION 61%
50.0000 mL | Freq: Once | INTRAVENOUS | Status: AC | PRN
  Administered 2016-11-18: 10 mL via INTRAVENOUS

## 2016-11-18 MED ORDER — IOPAMIDOL (ISOVUE-300) INJECTION 61%
INTRAVENOUS | Status: AC
  Administered 2016-11-18: 10 mL via INTRAVENOUS
  Filled 2016-11-18: qty 50

## 2016-11-18 MED ORDER — CEFAZOLIN SODIUM-DEXTROSE 2-4 GM/100ML-% IV SOLN
INTRAVENOUS | Status: AC
  Administered 2016-11-18: 2000 mg
  Filled 2016-11-18: qty 100

## 2016-11-18 MED ORDER — LIDOCAINE HCL 1 % IJ SOLN
INTRAMUSCULAR | Status: AC | PRN
  Administered 2016-11-18: 15 mL

## 2016-11-18 MED ORDER — LIDOCAINE HCL (PF) 2 % IJ SOLN
INTRAMUSCULAR | Status: AC
  Filled 2016-11-18: qty 20

## 2016-11-18 MED ORDER — MIDAZOLAM HCL 2 MG/2ML IJ SOLN
INTRAMUSCULAR | Status: AC | PRN
  Administered 2016-11-18 (×2): 1 mg via INTRAVENOUS

## 2016-11-18 MED ORDER — FENTANYL CITRATE (PF) 100 MCG/2ML IJ SOLN
INTRAMUSCULAR | Status: AC | PRN
  Administered 2016-11-18: 50 ug via INTRAVENOUS

## 2016-11-18 MED ORDER — MIDAZOLAM HCL 2 MG/2ML IJ SOLN
INTRAMUSCULAR | Status: AC
  Filled 2016-11-18: qty 6

## 2016-11-18 NOTE — Procedures (Signed)
  Procedure:   Left perc nephrostomy 88F Preprocedure diagnosis:  Hydronephrosis Postprocedure diagnosis:  same EBL:     minimal Complications:   none immediate  See full dictation in [facility].  Dillard Cannon MD Main # [phone] Pager  [phone]

## 2016-11-18 NOTE — Discharge Instructions (Signed)
Percutaneous Nephrostomy, Care After This sheet gives you information about how to care for yourself after your procedure. Your health care provider may also give you more specific instructions. If you have problems or questions, contact your health care provider. What can I expect after the procedure? After the procedure, it is common to have:  Some soreness where the nephrostomy tube was inserted (tube insertion site).  Blood-tinged drainage from the nephrostomy tube for the first 24 hours.  Follow these instructions at home: Activity  Return to your normal activities as told by your health care provider. Ask your health care provider what activities are safe for you.  Avoid activities that may cause the nephrostomy tubing to bend.  Do not take baths, swim, or use a hot tub until your health care provider approves. Ask your health care provider if you can take showers. Cover the nephrostomy tube dressing with a watertight covering when you take a shower.  Donot drive for 24 hours if you were given a medicine to help you relax (sedative). Care of the tube insertion site  Follow instructions from your health care provider about how to take care of your tube insertion site. Make sure you: ? Wash your hands with soap and water before you change your bandage (dressing). If soap and water are not available, use hand sanitizer. ? Change your dressing as told by your health care provider. Be careful not to pull on the tube while removing the dressing. ? When you change the dressing, wash the skin around the tube, rinse well, and pat the skin dry.  Check the tube insertion area every day for signs of infection. Check for: ? More redness, swelling, or pain. ? More fluid or blood. ? Warmth. ? Pus or a bad smell. Care of the nephrostomy tube and drainage bag  Always keep the tubing, the leg bag, or the bedside drainage bags below the level of the kidney so that your urine drains  freely.  When connecting your nephrostomy tube to a drainage bag, make sure that there are no kinks in the tubing and that your urine is draining freely. You may want to use an elastic bandage to wrap any exposed tubing that goes from the nephrostomy tube to any of the connecting tubes.  At night, you may want to connect your nephrostomy tube or the leg bag to a larger bedside drainage bag.  Follow instructions from your health care provider about how to empty or change the drainage bag.  Empty the drainage bag when it becomes ? full.  Replace the drainage bag and any extension tubing that is connected to your nephrostomy tube every 3 weeks or as often as told by your health care provider. Your health care provider will explain how to change the drainage bag and extension tubing. General instructions  Take over-the-counter and prescription medicines only as told by your health care provider.  Keep all follow-up visits as told by your health care provider. This is important. Contact a health care provider if:  You have problems with any of the valves or tubing.  You have persistent pain or soreness in your back.  You have more redness, swelling, or pain around your tube insertion site.  You have more fluid or blood coming from your tube insertion site.  Your tube insertion site feels warm to the touch.  You have pus or a bad smell coming from your tube insertion site.  You have increased urine output or you feel  burning when urinating. °Get help right away if: °· You have pain in your abdomen during the first week. °· You have chest pain or have trouble breathing. °· You have a new appearance of blood in your urine. °· You have a fever or chills. °· You have back pain that is not relieved by your medicine. °· You have decreased urine output. °· Your nephrostomy tube comes out. °This information is not intended to replace advice given to you by your health care provider. Make sure you  discuss any questions you have with your health care provider. °Document Released: 10/10/2003 Document Revised: 11/29/2015 Document Reviewed: 11/29/2015 °Elsevier Interactive Patient Education © 2018 [employer] ° ° ° °Moderate Conscious Sedation, Adult, Care After °These instructions provide you with information about caring for yourself after your procedure. Your health care provider may also give you more specific instructions. Your treatment has been planned according to current medical practices, but problems sometimes occur. Call your health care provider if you have any problems or questions after your procedure. °What can I expect after the procedure? °After your procedure, it is common: °· To feel sleepy for several hours. °· To feel clumsy and have poor balance for several hours. °· To have poor judgment for several hours. °· To vomit if you eat too soon. ° °Follow these instructions at home: °For at least 24 hours after the procedure: ° °· Do not: °? Participate in activities where you could fall or become injured. °? Drive. °? Use heavy machinery. °? Drink alcohol. °? Take sleeping pills or medicines that cause drowsiness. °? Make important decisions or sign legal documents. °? Take care of children on your own. °· Rest. °Eating and drinking °· Follow the diet recommended by your health care provider. °· If you vomit: °? Drink water, juice, or soup when you can drink without vomiting. °? Make sure you have little or no nausea before eating solid foods. °General instructions °· Have a responsible adult stay with you until you are awake and alert. °· Take over-the-counter and prescription medicines only as told by your health care provider. °· If you smoke, do not smoke without supervision. °· Keep all follow-up visits as told by your health care provider. This is important. °Contact a health care provider if: °· You keep feeling nauseous or you keep vomiting. °· You feel light-headed. °· You develop a  rash. °· You have a fever. °Get help right away if: °· You have trouble breathing. °This information is not intended to replace advice given to you by your health care provider. Make sure you discuss any questions you have with your health care provider. °Document Released: 12/07/2012 Document Revised: 07/22/2015 Document Reviewed: 06/08/2015 °Elsevier Interactive Patient Education © 2018 [employer] ° °

## 2016-11-18 NOTE — Progress Notes (Signed)
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 Margins negative. Urethra remains in situ.   Post-op Surveillance:  - 09/2016 CT - no recurrence.   2 - Bilateral NON-Complex Renal Cysts - left peripelvic cysts w/o hydro or mass effect and right <1cm cortical cysts x 2 on hematuria CT 04/2016. No mass effect / enhancing nodules / or coarse calcifications.   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   5- Recurrent Pyelonephritis / /  Left Ureteral Obstruction- few episodes febrile UTI following cystectomy 05/2016, including yeast as well as coliforms with variable resistance, (previously e. Coli res to all but zosyn, cipro, merrem, nitro). CT 09/2016 w/o signifcant obstruction / stones / abscesses, just chronic left caliectasis. He does have some chronic left mild hydro that pre-dates his cystectomy making evaluation for new obstruction difficult. Renogram this admission with excellent Rt excretion by very limited Lt suggestive of obstruction.   Today " Adrian Rios " is stable. He is receiving steroid pre-med for liekly Left IR neph tube today due to contrast allergy.   Objective: Vital signs in last 24 hours: Temp:  [97.6 F (36.4 C)-99.9 F (37.7 C)] 97.6 F (36.4 C) (09/19 0445) Pulse Rate:  [62-78] 62 (09/19 0445) Resp:  [16-18] 18 (09/19 0445) BP: (125-137)/(68-76) 136/69 (09/19 0445) SpO2:  [93 %-98 %] 93 % (09/19 0445) Weight:  [71.5 kg (157 lb 10.1 oz)] 71.5 kg (157 lb 10.1 oz) (09/19 0445) Last BM Date: 11/15/16  Intake/Output from previous day: 09/18 0701 -  09/19 0700 In: 2273.8 [P.O.:480; I.V.:1793.8] Out: -  Intake/Output this shift: Total I/O In: 1793.8 [I.V.:1793.8] Out: -    General appearance: alert and cooperative Eyes: negative Nose: Nares normal. Septum midline. Mucosa normal. No drainage or sinus tenderness. Throat: lips, mucosa, and tongue normal; teeth and gums normal Neck: supple, symmetrical, trachea midline Back: symmetric, no curvature. ROM normal. No CVA tenderness. Resp: non-labored on room air.  Cardio: Nl rate GI: soft, non-tender; bowel sounds normal; no masses,  no organomegaly and RLQ Urostomy pink / patent.  Extremities: extremities normal, atraumatic, no cyanosis or edema Pulses: 2+ and symmetric Skin: Skin color, texture, turgor normal. No rashes or lesions  Lab Results:   Recent Labs  11/15/16 0737 11/17/16 1912  WBC 10.2 14.5*  HGB 9.8* 9.0*  HCT 29.8* 27.3*  PLT 545* 487*   BMET  Recent Labs  11/15/16 0737  NA 143  K 4.1  CL 113*  CO2 23  GLUCOSE 105*  BUN 19  CREATININE 1.10  CALCIUM 9.2   PT/INR No results for input(s): LABPROT, INR in the last 72 hours. ABG No results for input(s): PHART, HCO3 in the last 72 hours.  Invalid input(s): PCO2, PO2  Studies/Results: Nm Renal Imaging Flow W/pharm  Result Date: 11/16/2016 CLINICAL DATA:  Ileal diversion with left hydroureteronephrosis EXAM: NUCLEAR MEDICINE RENAL SCAN WITH DIURETIC ADMINISTRATION TECHNIQUE: Radionuclide angiographic and sequential renal images were obtained after intravenous injection of radiopharmaceutical. Imaging was continued during slow intravenous injection of Lasix approximately 15 minutes after the start of the examination. RADIOPHARMACEUTICALS:  5.5 mCi Technetium-79m MAG3 IV COMPARISON:  CT scan 10/27/2016 FINDINGS: Flow:  Asymmetrically decreased  perfusion to the left kidney. Left renogram: Delayed. Right renogram: Normal. Differential: Left kidney = 28 % Right kidney = 72 % T1/2 post Lasix : Left kidney = no  measurable washout Right kidney = 10 minutes min IMPRESSION: 1. Asymmetrically decreased perfusion to the left kidney with no measurable washout. 2. Normal right renal perfusion with T1/2 post Lasix of 10 minutes. Electronically Signed   By: Misty Stanley M.D.   On: 11/16/2016 17:14    Anti-infectives: Anti-infectives    Start     Dose/Rate Route Frequency Ordered Stop   11/17/16 2000  cefTRIAXone (ROCEPHIN) 1 g in dextrose 5 % 50 mL IVPB     1 g 100 mL/hr over 30 Minutes Intravenous  Once 11/17/16 1900 11/17/16 2054   11/17/16 0000  fluconazole (DIFLUCAN) 200 MG tablet     200 mg Oral Daily 11/16/16 1617     11/13/16 1100  fluconazole (DIFLUCAN) tablet 200 mg     200 mg Oral Daily 11/13/16 1000     11/12/16 1200  fluconazole (DIFLUCAN) IVPB 100 mg  Status:  Discontinued     100 mg 50 mL/hr over 60 Minutes Intravenous Every 24 hours 11/12/16 1022 11/12/16 1028   11/12/16 1200  fluconazole (DIFLUCAN) IVPB 100 mg  Status:  Discontinued     100 mg 50 mL/hr over 60 Minutes Intravenous Every 24 hours 11/12/16 1028 11/12/16 1029   11/12/16 1200  fluconazole (DIFLUCAN) IVPB 200 mg  Status:  Discontinued     200 mg 100 mL/hr over 60 Minutes Intravenous Every 24 hours 11/12/16 1029 11/13/16 1000   11/12/16 0600  meropenem (MERREM) 1 g in sodium chloride 0.9 % 100 mL IVPB  Status:  Discontinued     1 g 200 mL/hr over 30 Minutes Intravenous Every 8 hours 11/12/16 0544 11/12/16 1022   11/12/16 0100  piperacillin-tazobactam (ZOSYN) IVPB 3.375 g     3.375 g 100 mL/hr over 30 Minutes Intravenous  Once 11/12/16 0045 11/12/16 0503   11/12/16 0100  vancomycin (VANCOCIN) IVPB 1000 mg/200 mL premix     1,000 mg 200 mL/hr over 60 Minutes Intravenous  Once 11/12/16 0045 11/12/16 0503      Assessment/Plan:  1 - Bladder Cancer - no evidence of recurrent disease by imaging on last CT in 09/2016, he will be receiving repeat surveillance eval in about 52mo.   2 - Bilateral NON-Complex Renal Cysts - stable,  No indication for furhter evaluation or treatment.   3- Moderate Risk Prostate Cancer - PSA at next cancer surveillance visit in about 1 mos  4 - Urinary Diversion - working well with normal GFR. He is facile with urostomy changes.   5- Pyelonephritis / Left Ureteral Obstrution-  left neph tube ordered for likely obstruction. Explained this will be temporixing and he will need antegrade ureteroscopy in elective setting to r/o obstruction and treat any mild obstruction v. Open revision if high grade / refracotry obstruction noted.   I am OK with DC home following neph tube today.   He has GU follow up with me on 10/4 in office.   Pt and family are understandably frustrated but do have good understanding of active issues and stepwise management plan.   [facility], Adrian Rios 11/18/2016

## 2016-11-18 NOTE — Care Management Note (Signed)
Case Management Note  Patient Details  Name: Adrian Rios MRN: [id number] Date of Birth: 1940/08/02  Subjective/Objective: d/c home today w/HHPT-[facility] rep [facility] aware. No further CM needs.                   Action/Plan:d/c home w/HHPT   Expected Discharge Date:  11/16/16               Expected Discharge Plan:  [name]  In-House Referral:     Discharge planning Services  CM Consult  Post Acute Care Choice:    Choice offered to:  Patient  DME Arranged:    DME Agency:     HH Arranged:  PT [place]:  [place]  Status of Service:  Completed, signed off  If discussed at [place] of Stay Meetings, dates discussed:    Additional Comments:  Dessa Phi, RN 11/18/2016, 12:20 PM

## 2016-11-18 NOTE — Discharge Summary (Addendum)
Physician Discharge Summary  Adrian Rios CXK:[id number] DOB: February 21, 1941 DOA: 11/11/2016  PCP: Adrian Rail, MD  Admit date: 11/11/2016 Discharge date: 11/18/2016  Time spent: 90minutes  Recommendations for Outpatient Follow-up:  1. Urology Dr.Manny in 10days 2. Home health PT and RN for Percut Nephrostomy drain   Discharge Diagnoses:  Principal Problem:   Recurrent urinary tract infection Active Problems:   Sarcoidosis ([place])   Hyperlipidemia   Depression   Glaucoma   Overweight (BMI 25.0-29.9)   Fever   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HCC)   History of bladder cancer   History of prostate cancer   SIRS (systemic inflammatory response syndrome) (HCC)   Malnutrition of moderate degree   Chronic diastolic CHF (congestive heart failure) ([place])   Sepsis ([place])   Discharge Condition: stable  Diet recommendation: regular  Filed Weights   11/17/16 0609 11/18/16 0300 11/18/16 0445  Weight: 70.6 kg (155 lb 9.6 oz) 71.5 kg (157 lb 10.1 oz) 71.5 kg (157 lb 10.1 oz)    History of present illness:  Adrian Rios a 76 y.o.malewith medical history significant for prostate cancer status post resection with secondary ileal conduit who is had several admissions in the past 6 months for GI, SEPSIS. Patient was recently discharged 2 weeks prior from the hospitalist service for similar. In June, patient had a urinary tract infection which grew out ESBL E.Coli. Urine cultures from the recent hospitalization 2 weeks ago gram multiple colonies unable to be classified as well as yeast. Patient was treated as if he had ESBL Escherichia coli and discharged on Cipro of which she completed his full course. Admitted with recurrent UTI, urology and ID consulted.  Hospital Course:  -Recurrent UTI;  -urine culture grew yeast last admission -now Urine culture with no growth  -seen by ID and Urology, treated with Diflucan per ID recs -Dr Adrian Rios. Recommend renogram. lasix renogram : . Showed decrease  perfusion on the left. He recommended, L percutaneous nephrostomy, this was completed today by IR -clinically stable post Nephrostomy and discharged home in stable condition to FU with Urology Dr.Manny  SIRS, related to UTI; Continue with diflucan as above  Diarrhea -transient, due to Abx, resolved  Hypokalemia; replete orally.   bladder cancer  -status post cystoprostatectomy and ileal conduit in April 2018 -FU with Urology   Procedures:  L Percutaneous nephrostomy 9/19 by IR  Consultations:  IR  ID  Urology  Discharge Exam: Vitals:   11/18/16 1116 11/18/16 1348  BP: 133/82 134/69  Pulse: 76 78  Resp: (!) 24 20  Temp:  98.1 F (36.7 C)  SpO2: 97% 95%    General: AAOx3 Cardiovascular: S1S2/RRR Respiratory: CTAB  Discharge Instructions   Discharge Instructions    Diet - low sodium heart healthy    Complete by:  As directed    Increase activity slowly    Complete by:  As directed      Current Discharge Medication List    START taking these medications   Details  fluconazole (DIFLUCAN) 200 MG tablet Take 1 tablet (200 mg total) by mouth daily. Qty: 5 tablet, Refills: 0    HYDROcodone-acetaminophen (NORCO/VICODIN) 5-325 MG tablet Take 1 tablet by mouth every 6 (six) hours as needed for moderate pain. Qty: 30 tablet, Refills: 0      CONTINUE these medications which have NOT CHANGED   Details  ADVAIR DISKUS 250-50 MCG/DOSE AEPB INHALE 1 DOSE BY MOUTH EVERY 12 HOURS Qty: 60 each, Refills: 1    albuterol (  PROVENTIL HFA;VENTOLIN HFA) 108 (90 Base) MCG/ACT inhaler Inhale 2 puffs into the lungs every 6 (six) hours as needed for wheezing or shortness of breath. Qty: 1 Inhaler, Refills: 8    docusate sodium (COLACE) 100 MG capsule Take 1 capsule (100 mg total) by mouth daily as needed for mild constipation. Qty: 10 capsule, Refills: 0    feeding supplement, ENSURE ENLIVE, (ENSURE ENLIVE) LIQD Take 237 mLs by mouth 2 (two) times daily between  meals. Qty: 237 mL, Refills: 12    magnesium oxide (MAG-OX) 400 MG tablet Take 400 mg by mouth daily.    methenamine (MANDELAMINE) 1 g tablet Take 1 tablet (1,000 mg total) by mouth 2 (two) times daily. Qty: 60 tablet, Refills: 0    Misc Natural Products (GLUCOSAMINE CHOND DOUBLE STR PO) Take 1 tablet by mouth 2 (two) times daily.    montelukast (SINGULAIR) 10 MG tablet TAKE ONE TABLET BY MOUTH ONCE DAILY Qty: 90 tablet, Refills: 3    ranitidine (ZANTAC) 150 MG tablet TAKE ONE TABLET BY MOUTH AT BEDTIME Qty: 90 tablet, Refills: 1    triamcinolone (NASACORT ALLERGY 24HR) 55 MCG/ACT AERO nasal inhaler Place 1 spray into the nose at bedtime.    venlafaxine (EFFEXOR) 75 MG tablet TAKE 1 TABLET BY MOUTH ONCE DAILY Qty: 90 tablet, Refills: 1      STOP taking these medications     saccharomyces boulardii (FLORASTOR) 250 MG capsule        Allergies  Allergen Reactions  . Ivp Dye [Iodinated Diagnostic Agents] Shortness Of Breath, Itching and Palpitations    "eyes itching,  Heart racing,  Effected breathing" 10-27-16 pt with 13 hr pre-meds for CT without any reaction-kj  . Shellfish Allergy Hives, Shortness Of Breath and Itching    Mostly crab  . Gabapentin Other (See Comments)    REACTION: dizziness and flushing  . Niacin-Lovastatin Er Other (See Comments)    Did not feel good on medication  . Adhesive [Tape] Rash    Use paper tape   Follow-up Information    [facility], [facility] Follow up.   Specialty:  [place] Why:  [place] [facility] Contact information: [address] [place] [address] [phone]            The results of significant diagnostics from this hospitalization (including imaging, microbiology, ancillary and laboratory) are listed below for reference.    Significant Diagnostic Studies: Dg Chest 2 View  Result Date: 11/12/2016 CLINICAL DATA:  Sepsis.  Fever.  Dizziness, onset yesterday. EXAM: CHEST  2 VIEW COMPARISON:   Radiographs and CT 10/25/2016 FINDINGS: The cardiomediastinal contours are unchanged. Bibasilar atelectasis or scarring, similar to prior exam. Pulmonary vasculature is normal. No consolidation, pleural effusion, or pneumothorax. No acute osseous abnormalities are seen. IMPRESSION: Bibasilar atelectasis or scarring, unchanged from prior exam. No acute abnormality. Electronically Signed   By: Jeb Levering M.D.   On: 11/12/2016 02:08   Dg Chest 2 View  Result Date: 10/25/2016 CLINICAL DATA:  Fevers EXAM: CHEST  2 VIEW COMPARISON:  09/08/2016 FINDINGS: The heart size and mediastinal contours are within normal limits. Both lungs are clear. The visualized skeletal structures are unremarkable. IMPRESSION: No active cardiopulmonary disease. Electronically Signed   By: Inez Catalina M.D.   On: 10/25/2016 20:45   Ct Chest Wo Contrast  Result Date: 10/25/2016 CLINICAL DATA:  Shortness of breath. Fever and urinary tract infection. History of sarcoidosis, bladder or prostate cancer. EXAM: CT CHEST WITHOUT CONTRAST TECHNIQUE:  Multidetector CT imaging of the chest was performed following the standard protocol without IV contrast. COMPARISON:  Chest radiograph October 25, 2016 at 2018 hours FINDINGS: CARDIOVASCULAR: Heart size is normal. Mild coronary artery calcifications. No pericardial effusion. Thoracic aorta is normal course and caliber, mild calcific atherosclerosis. LEFT vertebral artery arises directly from the aortic arch, normal variant. MEDIASTINUM/NODES: No mediastinal mass. No lymphadenopathy by CT size criteria though sensitivity decreased without oral contrast. Normal appearance of thoracic esophagus though not tailored for evaluation. LUNGS/PLEURA: Tracheobronchial tree is patent, no pneumothorax. No pleural effusions, focal consolidations, pulmonary nodules or masses. Mildly thickened RIGHT minor fissure. Minimal bibasilar atelectasis/ scarring. UPPER ABDOMEN: Nonacute. MUSCULOSKELETAL: Included soft  tissues and included osseous structures appear normal. IMPRESSION: Minimal bibasilar atelectasis/ scarring. Aortic Atherosclerosis (ICD10-I70.0). Electronically Signed   By: Elon Alas M.D.   On: 10/25/2016 21:43   Nm Renal Imaging Flow W/pharm  Result Date: 11/16/2016 CLINICAL DATA:  Ileal diversion with left hydroureteronephrosis EXAM: NUCLEAR MEDICINE RENAL SCAN WITH DIURETIC ADMINISTRATION TECHNIQUE: Radionuclide angiographic and sequential renal images were obtained after intravenous injection of radiopharmaceutical. Imaging was continued during slow intravenous injection of Lasix approximately 15 minutes after the start of the examination. RADIOPHARMACEUTICALS:  5.5 mCi Technetium-54m MAG3 IV COMPARISON:  CT scan 10/27/2016 FINDINGS: Flow:  Asymmetrically decreased perfusion to the left kidney. Left renogram: Delayed. Right renogram: Normal. Differential: Left kidney = 28 % Right kidney = 72 % T1/2 post Lasix : Left kidney = no measurable washout Right kidney = 10 minutes min IMPRESSION: 1. Asymmetrically decreased perfusion to the left kidney with no measurable washout. 2. Normal right renal perfusion with T1/2 post Lasix of 10 minutes. Electronically Signed   By: Misty Stanley M.D.   On: 11/16/2016 17:14   Ct Abdomen Pelvis W Contrast  Result Date: 10/27/2016 CLINICAL DATA:  Bladder carcinoma. Bladder ca protocol---13 hr pre-meds due to previous contrast allergy w/o reaction S/p cystoprostatectomy with ileal conduit 4/18 for bladder ca Recurrent, complicated UTI's EXAM: CT ABDOMEN AND PELVIS WITH CONTRAST TECHNIQUE: Multidetector CT imaging of the abdomen and pelvis was performed using the standard protocol following bolus administration of intravenous contrast. CONTRAST:  158mL ISOVUE-300 IOPAMIDOL (ISOVUE-300) INJECTION 61% COMPARISON:  03/05/16 FINDINGS: Lower chest: Lung bases are clear. Hepatobiliary: No focal hepatic lesion. No biliary duct dilatation. Gallbladder is normal. Common bile  duct is normal. Pancreas: Pancreas is normal. No ductal dilatation. No pancreatic inflammation. Spleen: Normal spleen Adrenals/urinary tract: Adrenal glands normal. No enhancing renal lesion. Mild hydronephrosis and hydroureter on the LEFT leading up to the ileal conduit. Mild hydroureter on the RIGHT without hydronephrosis. Delayed imaging demonstrates contrast flowing into the ileal conduit through the LEFT and RIGHT ureter. Ileostomy exiting through the RIGHT abdominal wall midline appears normal. There are several small filling defects within ileal conduit several cm distal to the junction of the LEFT RIGHT ureter (image 63, series 8). Favor small amounts of debris or clot. Stomach/Bowel: Stomach and duodenum are normal. Small bowel small bowel anastomosis in the mid abdomen. Cecum is normal. Colon is normal. Several diverticular the descending colon rectum normal Vascular/Lymphatic: Abdominal aorta is normal caliber. There is no retroperitoneal or periportal lymphadenopathy. No pelvic lymphadenopathy. Reproductive: Post proctitis cystectomy Other: No free fluid. Musculoskeletal: No aggressive osseous lesion. IMPRESSION: 1. Ileal conduit with hydronephrosis and hydroureter of the LEFT renal collecting system. No evidence of obstruction of the conduit. 2. Several small filling defects within the ileal conduit are favored debris. 3. Incidental findings of atherosclerosis and diverticulosis. Electronically Signed  By: Suzy Bouchard M.D.   On: 10/27/2016 11:32    Microbiology: Recent Results (from the past 240 hour(s))  Blood Culture (routine x 2)     Status: None   Collection Time: 11/12/16 12:10 AM  Result Value Ref Range Status   Specimen Description BLOOD LEFT FOREARM  Final   Special Requests   Final    BOTTLES DRAWN AEROBIC AND ANAEROBIC Blood Culture adequate volume   Culture   Final    NO GROWTH 5 DAYS Performed at [facility], [address] [place], [place] [address]    Report  Status 11/17/2016 FINAL  Final  Blood Culture (routine x 2)     Status: None   Collection Time: 11/12/16  1:00 AM  Result Value Ref Range Status   Specimen Description BLOOD LEFT HAND  Final   Special Requests   Final    BOTTLES DRAWN AEROBIC AND ANAEROBIC Blood Culture adequate volume   Culture   Final    NO GROWTH 5 DAYS Performed at [facility], [place] [address] [place], [place] [address]    Report Status 11/17/2016 FINAL  Final  Culture, Urine     Status: None   Collection Time: 11/12/16 10:19 PM  Result Value Ref Range Status   Specimen Description URINE, RANDOM  Final   Special Requests NONE  Final   Culture   Final    NO GROWTH Performed at [facility], [place] [address], [place], [place] [address]    Report Status 11/14/2016 FINAL  Final     Labs: Basic Metabolic Panel:  Recent Labs Lab 11/12/16 0008 11/12/16 1342 11/13/16 0524 11/14/16 0756 11/15/16 0737  NA 135  --  139 139 143  K 3.6  --  3.4* 3.4* 4.1  CL 107  --  108 108 113*  CO2 21*  --  23 22 23   GLUCOSE 125*  --  107* 108* 105*  BUN 23*  --  19 17 19   CREATININE 1.13 1.23 1.29* 1.14 1.10  CALCIUM 8.9  --  8.6* 8.7* 9.2   Liver Function Tests:  Recent Labs Lab 11/12/16 0008  AST 21  ALT 29  ALKPHOS 114  BILITOT 0.5  PROT 7.2  ALBUMIN 2.9*   No results for input(s): LIPASE, AMYLASE in the last 168 hours. No results for input(s): AMMONIA in the last 168 hours. CBC:  Recent Labs Lab 11/12/16 0008 11/12/16 1342 11/13/16 0524 11/14/16 0756 11/15/16 0737 11/17/16 1912  WBC 14.0* 17.2* 15.0* 11.3* 10.2 14.5*  NEUTROABS 11.3*  --   --   --   --   --   HGB 10.2* 10.2* 9.7* 9.7* 9.8* 9.0*  HCT 31.4* 30.7* 29.6* 29.5* 29.8* 27.3*  MCV 85.1 82.5 84.3 84.0 83.9 83.5  PLT 502* 416* 434* 500* 545* 487*   Cardiac Enzymes: No results for input(s): CKTOTAL, CKMB, CKMBINDEX, TROPONINI in the last 168 hours. BNP: BNP (last 3 results)  Recent Labs  10/25/16 1937  BNP 51.7     ProBNP (last 3 results) No results for input(s): PROBNP in the last 8760 hours.  CBG: No results for input(s): GLUCAP in the last 168 hours.     SignedDomenic Polite MD.  [facility] 11/18/2016, 2:35 PM

## 2016-11-18 NOTE — Progress Notes (Addendum)
Back from IR

## 2016-11-18 NOTE — Care Management Note (Signed)
Case Management Note  Patient Details  Name: Adrian Rios MRN: [id number] Date of Birth: 12-27-1940  Subjective/Objective: MD added HHRN-nephrostomy tube instruction-rep Meredeth Ide aware.                   Action/Plan:d/c home w/HHC.   Expected Discharge Date:  11/18/16               Expected Discharge Plan:  [place]  In-House Referral:     Discharge planning Services  CM Consult  Post Acute Care Choice:    Choice offered to:  Patient  DME Arranged:    DME Agency:     HH Arranged:  PT, RN [place] Agency:  [place]  Status of Service:  Completed, signed off  If discussed at [place] of Stay Meetings, dates discussed:    Additional Comments:  Dessa Phi, RN 11/18/2016, 2:39 PM

## 2016-11-19 ENCOUNTER — Encounter (HOSPITAL_COMMUNITY): Payer: Self-pay | Admitting: Interventional Radiology

## 2016-11-19 ENCOUNTER — Telehealth: Payer: Self-pay | Admitting: Internal Medicine

## 2016-11-19 DIAGNOSIS — Z436 Encounter for attention to other artificial openings of urinary tract: Secondary | ICD-10-CM | POA: Diagnosis not present

## 2016-11-19 DIAGNOSIS — N390 Urinary tract infection, site not specified: Secondary | ICD-10-CM | POA: Diagnosis not present

## 2016-11-19 NOTE — Telephone Encounter (Signed)
Notified Betty w/MD response../lmb 

## 2016-11-19 NOTE — Telephone Encounter (Signed)
Patient released from hospital yesterday 9/19.  Would like to know if Plot would be able to sign home health orders.   Also on discharge papers Advair is listed as taking twice a day but patient states he only takes once a day.

## 2016-11-19 NOTE — Telephone Encounter (Signed)
Ok to take Advair only once a day.   Not sure why Plot would sign -- I can sign tomorrow

## 2016-11-20 DIAGNOSIS — C678 Malignant neoplasm of overlapping sites of bladder: Secondary | ICD-10-CM | POA: Diagnosis not present

## 2016-11-20 DIAGNOSIS — C61 Malignant neoplasm of prostate: Secondary | ICD-10-CM | POA: Diagnosis not present

## 2016-11-20 DIAGNOSIS — N99524 Stenosis of incontinent stoma of urinary tract: Secondary | ICD-10-CM | POA: Diagnosis not present

## 2016-11-20 DIAGNOSIS — N1330 Unspecified hydronephrosis: Secondary | ICD-10-CM | POA: Diagnosis not present

## 2016-11-20 LAB — URINE CULTURE: SPECIAL REQUESTS: NORMAL

## 2016-11-23 ENCOUNTER — Telehealth: Payer: Self-pay | Admitting: Internal Medicine

## 2016-11-23 NOTE — Telephone Encounter (Signed)
noted 

## 2016-11-23 NOTE — Telephone Encounter (Signed)
FYI: PT called and stated the patient has refused PT. The hospital placed the referral. The patient is walking around fine and does not feel he needs it.

## 2016-11-24 DIAGNOSIS — Z436 Encounter for attention to other artificial openings of urinary tract: Secondary | ICD-10-CM | POA: Diagnosis not present

## 2016-11-24 DIAGNOSIS — N390 Urinary tract infection, site not specified: Secondary | ICD-10-CM | POA: Diagnosis not present

## 2016-11-26 NOTE — Progress Notes (Signed)
Subjective:    Patient ID: Adrian Rios, male    DOB: 08/24/1940, 76 y.o.   MRN: [id number]  HPI The patient is here for follow up from the hospital.   He has had multiple hospitalizations for urosepsis over the past several months since having bladder/prostate surgery on 06/12/16.  His most recent hospitalization was 11/11/16 - 11/18/16.  He presented to the ED with a fever, weakness, loose stools, UA revealing possible UTI.  He received iv vanco and zosyn.  Urology and ID was consulted.  Urine culture grew yeast last admission, culture this admission had no growth.  He was treated with Diflucan per ID, which he has completed.  He had a renogram and it showed decrease in perfusion on the left.  He recommended L percutaneous nephrostomy, which was completed by IR.   Energy level is down.  Feeling better. His appetite is still low but improving. He has surgery planned for early October to help open up his ureter on the left. If that surgery does not work he will need a bigger surgery.   He will see [place] urology this coming week.  He denies any urine odor or discoloration.  He denies fever or chills.   Prediabetes:  He is compliant with a low sugar/carbohydrate diet.  He is exercising some - walking his dog.  Depression: He is taking his medication daily as prescribed. He denies any side effects from the medication. He feels his depression is well controlled and he is happy with his current dose of medication.   GERD:  He is taking his medication daily as prescribed.  He has occasional GERD symptoms and has clearing his throat, has hoarseness.    Medications and allergies reviewed with patient and updated if appropriate.  Patient Active Problem List   Diagnosis Date Noted  . Recurrent urinary tract infection 11/13/2016  . Sepsis ([place]) 11/13/2016  . Chronic diastolic CHF (congestive heart failure) ([place]) 11/12/2016  . UTI (urinary tract infection) 10/26/2016  . AKI (acute kidney injury) ([place])  10/26/2016  . Complicated UTI (urinary tract infection) 10/26/2016  . Fatigue 08/19/2016  . Malnutrition of moderate degree 08/05/2016  . SIRS (systemic inflammatory response syndrome) ([place]) 08/04/2016  . History of bladder cancer 06/18/2016  . History of prostate cancer 06/18/2016  . 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[place]) 06/12/2016  . Fever 05/16/2016  . Carotid bruit 02/17/2016  . Prediabetes 08/20/2015  . Overweight (BMI 25.0-29.9) 08/20/2015  . Esophageal reflux 08/20/2015  . Glaucoma 02/14/2015  . Varicose veins of lower extremities with other complications 48/03/6551  . RAD (reactive airway disease) 10/27/2011  . 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 Anemia, unspecified 10/27/2011  . 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 ERECTILE DYSFUNCTION 04/23/2010  . [place] DISEASE, LUMBOSACRAL SPINE 02/06/2010  . 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 Depression 04/12/2008  . Allergic rhinitis 04/12/2008  . Hyperlipidemia 10/05/2006  . Sarcoidosis ([place]) 07/05/2006    Current Outpatient Prescriptions on File Prior to Visit  Medication Sig Dispense Refill  . magnesium oxide (MAG-OX) 400 MG tablet Take 400 mg by mouth daily.    . methenamine (MANDELAMINE) 1 g tablet Take 1 tablet (1,000 mg total) by mouth 2 (two) times daily. 60 tablet 0  . Misc Natural Products (GLUCOSAMINE CHOND DOUBLE STR PO) Take 1 tablet by mouth 2 (two) times daily.    . montelukast (SINGULAIR) 10 MG tablet TAKE ONE TABLET BY MOUTH ONCE DAILY 90 tablet 3  . ranitidine (ZANTAC) 150 MG  tablet TAKE ONE TABLET BY MOUTH AT BEDTIME 90 tablet 1  . triamcinolone (NASACORT ALLERGY 24HR) 55 MCG/ACT AERO nasal inhaler Place 1 spray into the nose at bedtime.    [place] venlafaxine (EFFEXOR) 75 MG tablet TAKE 1 TABLET BY MOUTH ONCE DAILY 90 tablet 1   No current facility-administered medications on file prior to visit.     Past Medical History:  Diagnosis Date  . Allergic rhinitis   . Arthritis   . Benign localized  prostatic hyperplasia with lower urinary tract symptoms (LUTS)   . Bladder tumor   . Borderline glaucoma of right eye   . Cancer [facility])    bladder and prostate  . Carotid bruit    per duplex 03-11-2016 RICA 1-39%  . Chronic throat clearing   . DDD (degenerative disc disease), lumbar   . Depression   . ED (erectile dysfunction)   . Elevated PSA    urologist-  dr [name]--- s/p  prostate bx's  . GERD (gastroesophageal reflux disease)   . Hematuria   . History of chronic bronchitis   . 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 History of squamous cell carcinoma excision    several times  . Hyperlipidemia   . Pre-diabetes   . Premature ventricular contractions (PVCs) (VPCs)   . RAD (reactive airway disease)   . 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 Sensorineural hearing loss (SNHL) of both ears   . Sepsis ([place]) 09/2016  . UTI (urinary tract infection) 08/2016  . Wears glasses   . Wears hearing aid    bilateral    Past Surgical History:  Procedure Laterality Date  . APPENDECTOMY  2008  . CARDIOVASCULAR STRESS TEST  05/27/2010   normal nuclear study w/ no ischemia/  normal LV function and wall motion , ef 60%  . CYSTOSCOPY WITH INJECTION N/A 06/12/2016   Procedure: CYSTOSCOPY WITH INJECTION OF INDOCYANINE GREEN DYE;  Surgeon: Alexis Frock, MD;  Location: [facility];  Service: Urology;  Laterality: N/A;  . DEEP NECK LYMPH NODE BIOPSY / EXCISION  1970's   and Bronchoscopy w/ bx's ( dx Sarcoidosis)  . ILEOSTOMY    . IR NEPHROSTOMY PLACEMENT LEFT  11/18/2016  . MOHS SURGERY  2013 approx.    nasal; pre melanoma  . PARS PLANA VITRECTOMY Right 2007   repair macular pucker  . TONSILLECTOMY AND ADENOIDECTOMY  child  . TRANSURETHRAL RESECTION OF BLADDER TUMOR N/A 04/06/2016   Procedure: CYSTOSCOPY TRANSURETHRAL RESECTION OF BLADDER TUMORS (TURBT);  Surgeon: Carolan Clines,  MD;  Location[facility];  Service: Urology;  Laterality: N/A;  . TRANSURETHRAL RESECTION OF PROSTATE      Social History   Social History  . Marital status: Married    Spouse name: N/A  . Number of children: N/A  . Years of education: N/A   Social History Main Topics  . Smoking status: Never Smoker  . Smokeless tobacco: Never Used  . Alcohol use No  . Drug use: No  . Sexual activity: Not Asked   Other Topics Concern  . None   Social History Narrative  . None    Family History  Problem Relation Age of Onset  . Stroke Mother        in her 23s  . Breast cancer Sister   . Heart disease Maternal  Grandmother 75       MI   . Breast cancer Paternal Grandmother   . Heart disease Paternal Grandfather 19       MI  . Stroke Brother   . Diabetes Neg Hx     Review of Systems  Constitutional: Positive for appetite change (decreased, but improving).  Respiratory: Positive for shortness of breath (stairs). Negative for cough and wheezing.   Cardiovascular: Negative for chest pain and palpitations.  Gastrointestinal: Positive for diarrhea. Negative for abdominal pain and blood in stool.       No gerd  Neurological: Positive for light-headedness (occ). Negative for headaches.       Objective:   Vitals:   11/27/16 1011  BP: 114/66  Pulse: 83  Resp: 16  Temp: 97.9 F (36.6 C)  SpO2: 98%   Wt Readings from Last 3 Encounters:  11/27/16 156 lb (70.8 kg)  11/18/16 157 lb 10.1 oz (71.5 kg)  11/04/16 160 lb 8 oz (72.8 kg)   Body mass index is 24.43 kg/m.   Physical Exam    Constitutional: Appears well-developed and well-nourished. No distress.  HENT:  Head: Normocephalic and atraumatic.  Neck: Neck supple. No tracheal deviation present. No thyromegaly present.  No cervical lymphadenopathy Cardiovascular: Normal rate, regular rhythm and normal heart sounds.   No murmur heard. No carotid bruit .  No edema Pulmonary/Chest: Effort normal and breath  sounds normal. No respiratory distress. No has no wheezes. No rales.  Skin: Skin is warm and dry. Not diaphoretic.  Psychiatric: Normal mood and affect. Behavior is normal.      Assessment & Plan:    See Problem List for Assessment and Plan of chronic medical problems.

## 2016-11-27 ENCOUNTER — Ambulatory Visit (INDEPENDENT_AMBULATORY_CARE_PROVIDER_SITE_OTHER): Payer: Medicare Other | Admitting: Internal Medicine

## 2016-11-27 ENCOUNTER — Encounter: Payer: Self-pay | Admitting: Internal Medicine

## 2016-11-27 VITALS — BP 114/66 | HR 83 | Temp 97.9°F | Resp 16 | Wt 156.0 lb

## 2016-11-27 DIAGNOSIS — A419 Sepsis, unspecified organism: Secondary | ICD-10-CM | POA: Diagnosis not present

## 2016-11-27 DIAGNOSIS — R7303 Prediabetes: Secondary | ICD-10-CM

## 2016-11-27 DIAGNOSIS — F329 Major depressive disorder, single episode, unspecified: Secondary | ICD-10-CM

## 2016-11-27 DIAGNOSIS — K219 Gastro-esophageal reflux disease without esophagitis: Secondary | ICD-10-CM | POA: Diagnosis not present

## 2016-11-27 DIAGNOSIS — F32A Depression, unspecified: Secondary | ICD-10-CM

## 2016-11-27 MED ORDER — RANITIDINE HCL 150 MG PO TABS: 150.0000 mg | ORAL_TABLET | Freq: Two times a day (BID) | ORAL | 1 refills | Status: DC

## 2016-11-27 NOTE — Assessment & Plan Note (Signed)
Lab Results  Component Value Date   HGBA1C 6.0 08/19/2016   Sugars have been controlled Compliant with low sugar/carb diet Increase exercise

## 2016-11-27 NOTE — Assessment & Plan Note (Signed)
?   Controlled Increase zantac to BID Discussed possible symptoms of GERD If no improvement in symptoms advised him to call

## 2016-11-27 NOTE — Patient Instructions (Signed)
  Medications reviewed and updated.  No changes recommended at this time.     Please followup in 6 months   

## 2016-11-27 NOTE — Assessment & Plan Note (Signed)
Controlled, stable Continue current dose of medication  

## 2016-11-27 NOTE — Assessment & Plan Note (Signed)
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

## 2016-11-28 ENCOUNTER — Encounter: Payer: Self-pay | Admitting: Internal Medicine

## 2016-11-30 DIAGNOSIS — Z436 Encounter for attention to other artificial openings of urinary tract: Secondary | ICD-10-CM | POA: Diagnosis not present

## 2016-11-30 DIAGNOSIS — N390 Urinary tract infection, site not specified: Secondary | ICD-10-CM | POA: Diagnosis not present

## 2016-12-01 DIAGNOSIS — N99524 Stenosis of incontinent stoma of urinary tract: Secondary | ICD-10-CM | POA: Diagnosis not present

## 2016-12-01 DIAGNOSIS — D649 Anemia, unspecified: Secondary | ICD-10-CM | POA: Diagnosis not present

## 2016-12-03 ENCOUNTER — Ambulatory Visit (INDEPENDENT_AMBULATORY_CARE_PROVIDER_SITE_OTHER): Payer: Medicare Other | Admitting: Internal Medicine

## 2016-12-03 ENCOUNTER — Encounter: Payer: Self-pay | Admitting: Internal Medicine

## 2016-12-03 DIAGNOSIS — N390 Urinary tract infection, site not specified: Secondary | ICD-10-CM

## 2016-12-03 DIAGNOSIS — Z436 Encounter for attention to other artificial openings of urinary tract: Secondary | ICD-10-CM | POA: Diagnosis not present

## 2016-12-03 NOTE — Assessment & Plan Note (Signed)
I suspect that his left ureteral obstruction is the reason he has had recurrent urinary tract infections. Hopefully if that can be treated effectively he will not continue to have infections. He can follow-up here as needed.

## 2016-12-03 NOTE — Progress Notes (Signed)
[facility] for [facility]ious Disease  Patient Active Problem List   Diagnosis Date Noted  . Recurrent urinary tract infection 11/13/2016    Priority: High  . 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 History of bladder cancer 06/18/2016    Priority: Medium  . History of prostate cancer 06/18/2016    Priority: Medium  . Sepsis ([place]) 11/13/2016  . Chronic diastolic CHF (congestive heart failure) ([place]) 11/12/2016  . UTI (urinary tract infection) 10/26/2016  . AKI (acute kidney injury) ([place]) 10/26/2016  . Complicated UTI (urinary tract infection) 10/26/2016  . Fatigue 08/19/2016  . Malnutrition of moderate degree 08/05/2016  . SIRS (systemic inflammatory response syndrome) ([place]) 08/04/2016  . Fever 05/16/2016  . Carotid bruit 02/17/2016  . Prediabetes 08/20/2015  . Overweight (BMI 25.0-29.9) 08/20/2015  . Esophageal reflux 08/20/2015  . Glaucoma 02/14/2015  . Varicose veins of lower extremities with other complications 25/36/6440  . RAD (reactive airway disease) 10/27/2011  . 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 Anemia, unspecified 10/27/2011  . 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 ERECTILE DYSFUNCTION 04/23/2010  . [place] DISEASE, LUMBOSACRAL SPINE 02/06/2010  . 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 Depression 04/12/2008  . Allergic rhinitis 04/12/2008  . Hyperlipidemia 10/05/2006  . Sarcoidosis ([place]) 07/05/2006    Patient's Medications  New Prescriptions   No medications on file  Previous Medications   FLUTICASONE-SALMETEROL (ADVAIR) 250-50 MCG/DOSE AEPB    Inhale 1 puff into the lungs 2 (two) times daily.   MAGNESIUM OXIDE (MAG-OX) 400 MG TABLET    Take 400 mg by mouth daily.   METHENAMINE (MANDELAMINE) 1 G TABLET    Take 1 tablet (1,000 mg total) by mouth 2 (two) times daily.   MISC NATURAL PRODUCTS (GLUCOSAMINE CHOND DOUBLE STR PO)    Take 1 tablet by mouth 2 (two) times daily.   MONTELUKAST (SINGULAIR) 10 MG  TABLET    TAKE ONE TABLET BY MOUTH ONCE DAILY   RANITIDINE (ZANTAC) 150 MG TABLET    Take 1 tablet (150 mg total) by mouth 2 (two) times daily.   TRIAMCINOLONE (NASACORT ALLERGY 24HR) 55 MCG/ACT AERO NASAL INHALER    Place 1 spray into the nose at bedtime.   VENLAFAXINE (EFFEXOR) 75 MG TABLET    TAKE 1 TABLET BY MOUTH ONCE DAILY  Modified Medications   No medications on file  Discontinued Medications   No medications on file    Subjective: Adrian Rios is in for his routine hospital follow-up. He is a 76 y.o. male who underwent robotic cystoprostatectomy and ileal conduit formation in April of this year for bladder and prostate cancer. His ureteral stents were removed about one month postop. He has had problems with recurrent urinary tract infection since that time and was admitted in early June and again and late August. He estimates that he has been treated 7-8 times for urinary tract infection since his surgery. I do not have access to any outpatient urine culture results but urine cultures that have been done here show the following:  Urine culture 08/04/2016: Pansensitive Escherichia coli Urine culture 08/21/2016: ESBL producing Escherichia coli Urine culture 10/25/2016: Multiple species Urine culture 10/29/2016: Yeast  When he was last discharged on 10/28/2016 he was prescribed ciprofloxacin and methenamine. He had a CT scan on 10/27/2016 which showed:  CT abdomen and pelvis with contrast  PRESSION: 1. Ileal conduit with hydronephrosis and hydroureter of the LEFT renal collecting system. No evidence  of obstruction of the conduit. 2. Several small filling defects within the ileal conduit are favored debris. 3. Incidental findings of atherosclerosis and diverticulosis.  By: Suzy Bouchard M.D. On: 10/27/2016 11:32  He was admitted again on 11/11/2016 with fever and malaise. He also had some left flank pain similar to previous episodes. He received 1 dose of IV vancomycin  and piperacillin tazobactam in the emergency department and was started on fluconazole. He improved promptly.  A nuclear medicine scan showed decreased perfusion to the left kidney suggesting obstruction. A left percutaneous nephrostomy tube was placed on 11/19/2016. He completed fluconazole therapy and has felt well since that time. He was evaluated at [facility] and will be undergoing a procedure next week to hopefully locate and treat a left ureteral stricture.  Review of Systems: Review of Systems  Constitutional: Negative for chills and fever.  Gastrointestinal: Negative for abdominal pain, diarrhea, nausea and vomiting.    Past Medical History:  Diagnosis Date  . Allergic rhinitis   . Arthritis   . Benign localized prostatic hyperplasia with lower urinary tract symptoms (LUTS)   . Bladder tumor   . Borderline glaucoma of right eye   . Cancer [facility])    bladder and prostate  . Carotid bruit    per duplex 03-11-2016 RICA 1-39%  . Chronic throat clearing   . DDD (degenerative disc disease), lumbar   . Depression   . ED (erectile dysfunction)   . Elevated PSA    urologist-  dr [name]--- s/p  prostate bx's  . GERD (gastroesophageal reflux disease)   . Hematuria   . History of chronic bronchitis   . 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 History of squamous cell carcinoma excision    several times  . Hyperlipidemia   . Pre-diabetes   . Premature ventricular contractions (PVCs) (VPCs)   . RAD (reactive airway disease)   . 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 Sensorineural hearing loss (SNHL) of both ears   . Sepsis ([place]) 09/2016  . UTI (urinary tract infection) 08/2016  . Wears glasses   . Wears hearing aid    bilateral    Social History  Substance Use Topics  . Smoking status: Never Smoker  . Smokeless tobacco: Never Used  . Alcohol  use No    Family History  Problem Relation Age of Onset  . Stroke Mother        in her 76s  . Breast cancer Sister   . Heart disease Maternal Grandmother 84       MI   . Breast cancer Paternal Grandmother   . Heart disease Paternal Grandfather 53       MI  . Stroke Brother   . Diabetes Neg Hx     Allergies  Allergen Reactions  . Ivp Dye [Iodinated Diagnostic Agents] Shortness Of Breath, Itching and Palpitations    "eyes itching,  Heart racing,  Effected breathing" 10-27-16 pt with 13 hr pre-meds for CT without any reaction-kj  . Shellfish Allergy Hives, Shortness Of Breath and Itching    Mostly crab  . Gabapentin Other (See Comments)    REACTION: dizziness and flushing  . Niacin-Lovastatin Er Other (See Comments)    Did not feel good on medication  . Adhesive [Tape] Rash    Use paper tape  Objective: Vitals:   12/03/16 1409  BP: 106/71  Pulse: 99  Temp: 97.8 F (36.6 C)  TempSrc: Oral  Weight: 158 lb (71.7 kg)   Body mass index is 24.75 kg/m.  Physical Exam  Constitutional: He is oriented to person, place, and time. No distress.  Abdominal: Soft. He exhibits no distension. There is no tenderness.  Neurological: He is alert and oriented to person, place, and time.  Skin: No rash noted.  Psychiatric: Mood and affect normal.      Problem List Items Addressed This Visit      High   Recurrent urinary tract infection    I suspect that his left ureteral obstruction is the reason he has had recurrent urinary tract infections. Hopefully if that can be treated effectively he will not continue to have infections. He can follow-up here as needed.          Michel Bickers, MD [facility] for [facility] [place] [facility] [phone] pager   [phone] cell 12/03/2016, 2:22 PM

## 2016-12-08 ENCOUNTER — Telehealth: Payer: Self-pay | Admitting: Internal Medicine

## 2016-12-08 DIAGNOSIS — Z436 Encounter for attention to other artificial openings of urinary tract: Secondary | ICD-10-CM | POA: Diagnosis not present

## 2016-12-08 DIAGNOSIS — N390 Urinary tract infection, site not specified: Secondary | ICD-10-CM | POA: Diagnosis not present

## 2016-12-08 NOTE — Telephone Encounter (Signed)
Wanted to do PRN visit today b/c states there is redness and drainage around nephrostomy tube.  Patient is due for surgery tomorrow and wants looked at.

## 2016-12-08 NOTE — Telephone Encounter (Signed)
Spoke with Inez Catalina to give verbal orders for PRN visit per MD

## 2016-12-09 DIAGNOSIS — K219 Gastro-esophageal reflux disease without esophagitis: Secondary | ICD-10-CM | POA: Diagnosis not present

## 2016-12-09 DIAGNOSIS — I491 Atrial premature depolarization: Secondary | ICD-10-CM | POA: Diagnosis not present

## 2016-12-09 DIAGNOSIS — Z888 Allergy status to other drugs, medicaments and biological substances status: Secondary | ICD-10-CM | POA: Diagnosis not present

## 2016-12-09 DIAGNOSIS — I493 Ventricular premature depolarization: Secondary | ICD-10-CM | POA: Diagnosis not present

## 2016-12-09 DIAGNOSIS — N135 Crossing vessel and stricture of ureter without hydronephrosis: Secondary | ICD-10-CM | POA: Diagnosis not present

## 2016-12-09 DIAGNOSIS — Z91013 Allergy to seafood: Secondary | ICD-10-CM | POA: Diagnosis not present

## 2016-12-09 DIAGNOSIS — N9989 Other postprocedural complications and disorders of genitourinary system: Secondary | ICD-10-CM | POA: Diagnosis not present

## 2016-12-09 DIAGNOSIS — I472 Ventricular tachycardia: Secondary | ICD-10-CM | POA: Diagnosis not present

## 2016-12-09 DIAGNOSIS — I5189 Other ill-defined heart diseases: Secondary | ICD-10-CM | POA: Diagnosis not present

## 2016-12-09 DIAGNOSIS — R490 Dysphonia: Secondary | ICD-10-CM | POA: Diagnosis not present

## 2016-12-09 DIAGNOSIS — D631 Anemia in chronic kidney disease: Secondary | ICD-10-CM | POA: Diagnosis not present

## 2016-12-09 DIAGNOSIS — Z906 Acquired absence of other parts of urinary tract: Secondary | ICD-10-CM | POA: Diagnosis not present

## 2016-12-09 DIAGNOSIS — Z79899 Other long term (current) drug therapy: Secondary | ICD-10-CM | POA: Diagnosis not present

## 2016-12-09 DIAGNOSIS — Z8551 Personal history of malignant neoplasm of bladder: Secondary | ICD-10-CM | POA: Diagnosis not present

## 2016-12-09 DIAGNOSIS — D860 Sarcoidosis of lung: Secondary | ICD-10-CM | POA: Diagnosis not present

## 2016-12-09 DIAGNOSIS — Z87440 Personal history of urinary (tract) infections: Secondary | ICD-10-CM | POA: Diagnosis not present

## 2016-12-09 DIAGNOSIS — N201 Calculus of ureter: Secondary | ICD-10-CM | POA: Diagnosis not present

## 2016-12-09 DIAGNOSIS — Z91048 Other nonmedicinal substance allergy status: Secondary | ICD-10-CM | POA: Diagnosis not present

## 2016-12-09 DIAGNOSIS — N182 Chronic kidney disease, stage 2 (mild): Secondary | ICD-10-CM | POA: Diagnosis not present

## 2016-12-10 DIAGNOSIS — K219 Gastro-esophageal reflux disease without esophagitis: Secondary | ICD-10-CM | POA: Diagnosis not present

## 2016-12-10 DIAGNOSIS — D860 Sarcoidosis of lung: Secondary | ICD-10-CM | POA: Diagnosis not present

## 2016-12-10 DIAGNOSIS — N182 Chronic kidney disease, stage 2 (mild): Secondary | ICD-10-CM | POA: Diagnosis not present

## 2016-12-10 DIAGNOSIS — N9989 Other postprocedural complications and disorders of genitourinary system: Secondary | ICD-10-CM | POA: Diagnosis not present

## 2016-12-10 DIAGNOSIS — N201 Calculus of ureter: Secondary | ICD-10-CM | POA: Diagnosis not present

## 2016-12-10 DIAGNOSIS — D631 Anemia in chronic kidney disease: Secondary | ICD-10-CM | POA: Diagnosis not present

## 2016-12-11 DIAGNOSIS — Z436 Encounter for attention to other artificial openings of urinary tract: Secondary | ICD-10-CM | POA: Diagnosis not present

## 2016-12-11 DIAGNOSIS — N390 Urinary tract infection, site not specified: Secondary | ICD-10-CM | POA: Diagnosis not present

## 2016-12-14 DIAGNOSIS — Z8551 Personal history of malignant neoplasm of bladder: Secondary | ICD-10-CM | POA: Diagnosis not present

## 2016-12-14 DIAGNOSIS — Z8546 Personal history of malignant neoplasm of prostate: Secondary | ICD-10-CM | POA: Diagnosis not present

## 2016-12-14 DIAGNOSIS — H409 Unspecified glaucoma: Secondary | ICD-10-CM | POA: Diagnosis not present

## 2016-12-14 DIAGNOSIS — Z436 Encounter for attention to other artificial openings of urinary tract: Secondary | ICD-10-CM | POA: Diagnosis not present

## 2016-12-14 DIAGNOSIS — I5032 Chronic diastolic (congestive) heart failure: Secondary | ICD-10-CM | POA: Diagnosis not present

## 2016-12-14 DIAGNOSIS — N390 Urinary tract infection, site not specified: Secondary | ICD-10-CM | POA: Diagnosis not present

## 2016-12-14 DIAGNOSIS — E440 Moderate protein-calorie malnutrition: Secondary | ICD-10-CM | POA: Diagnosis not present

## 2016-12-14 DIAGNOSIS — E785 Hyperlipidemia, unspecified: Secondary | ICD-10-CM | POA: Diagnosis not present

## 2016-12-14 DIAGNOSIS — F329 Major depressive disorder, single episode, unspecified: Secondary | ICD-10-CM | POA: Diagnosis not present

## 2016-12-14 DIAGNOSIS — D869 Sarcoidosis, unspecified: Secondary | ICD-10-CM | POA: Diagnosis not present

## 2016-12-15 ENCOUNTER — Telehealth: Payer: Self-pay | Admitting: Internal Medicine

## 2016-12-15 NOTE — Telephone Encounter (Signed)
LVM with Inez Catalina giving verbal orders per MD

## 2016-12-15 NOTE — Telephone Encounter (Signed)
Requesting one skilled nursing for once a week for two weeks.  States patient is going in for surgery.

## 2016-12-17 DIAGNOSIS — N390 Urinary tract infection, site not specified: Secondary | ICD-10-CM | POA: Diagnosis not present

## 2016-12-17 DIAGNOSIS — Z436 Encounter for attention to other artificial openings of urinary tract: Secondary | ICD-10-CM | POA: Diagnosis not present

## 2016-12-17 DIAGNOSIS — N9989 Other postprocedural complications and disorders of genitourinary system: Secondary | ICD-10-CM | POA: Diagnosis not present

## 2016-12-22 DIAGNOSIS — Z436 Encounter for attention to other artificial openings of urinary tract: Secondary | ICD-10-CM | POA: Diagnosis not present

## 2016-12-22 DIAGNOSIS — N390 Urinary tract infection, site not specified: Secondary | ICD-10-CM | POA: Diagnosis not present

## 2016-12-24 ENCOUNTER — Encounter: Payer: Medicare Other | Admitting: Internal Medicine

## 2016-12-28 DIAGNOSIS — N182 Chronic kidney disease, stage 2 (mild): Secondary | ICD-10-CM | POA: Diagnosis not present

## 2016-12-28 DIAGNOSIS — K219 Gastro-esophageal reflux disease without esophagitis: Secondary | ICD-10-CM | POA: Diagnosis not present

## 2016-12-28 DIAGNOSIS — C678 Malignant neoplasm of overlapping sites of bladder: Secondary | ICD-10-CM | POA: Diagnosis not present

## 2016-12-28 DIAGNOSIS — Z936 Other artificial openings of urinary tract status: Secondary | ICD-10-CM | POA: Diagnosis not present

## 2016-12-28 DIAGNOSIS — Z8551 Personal history of malignant neoplasm of bladder: Secondary | ICD-10-CM | POA: Diagnosis not present

## 2016-12-28 DIAGNOSIS — R000 Tachycardia, unspecified: Secondary | ICD-10-CM | POA: Diagnosis not present

## 2016-12-28 DIAGNOSIS — N2889 Other specified disorders of kidney and ureter: Secondary | ICD-10-CM | POA: Diagnosis not present

## 2016-12-28 DIAGNOSIS — N135 Crossing vessel and stricture of ureter without hydronephrosis: Secondary | ICD-10-CM | POA: Diagnosis not present

## 2016-12-28 DIAGNOSIS — H9190 Unspecified hearing loss, unspecified ear: Secondary | ICD-10-CM | POA: Diagnosis present

## 2016-12-28 DIAGNOSIS — Z960 Presence of urogenital implants: Secondary | ICD-10-CM | POA: Diagnosis not present

## 2016-12-28 DIAGNOSIS — K660 Peritoneal adhesions (postprocedural) (postinfection): Secondary | ICD-10-CM | POA: Diagnosis present

## 2016-12-28 DIAGNOSIS — G8918 Other acute postprocedural pain: Secondary | ICD-10-CM | POA: Diagnosis not present

## 2016-12-28 DIAGNOSIS — Z8546 Personal history of malignant neoplasm of prostate: Secondary | ICD-10-CM | POA: Diagnosis not present

## 2016-12-28 DIAGNOSIS — N99114 Postprocedural urethral stricture, male, unspecified: Secondary | ICD-10-CM | POA: Diagnosis present

## 2016-12-28 DIAGNOSIS — Z9079 Acquired absence of other genital organ(s): Secondary | ICD-10-CM | POA: Diagnosis not present

## 2016-12-28 DIAGNOSIS — Z9889 Other specified postprocedural states: Secondary | ICD-10-CM | POA: Diagnosis not present

## 2016-12-28 DIAGNOSIS — L299 Pruritus, unspecified: Secondary | ICD-10-CM | POA: Diagnosis not present

## 2016-12-28 DIAGNOSIS — Z906 Acquired absence of other parts of urinary tract: Secondary | ICD-10-CM | POA: Diagnosis not present

## 2016-12-28 DIAGNOSIS — N131 Hydronephrosis with ureteral stricture, not elsewhere classified: Secondary | ICD-10-CM | POA: Diagnosis not present

## 2017-01-06 ENCOUNTER — Other Ambulatory Visit: Payer: Self-pay | Admitting: Internal Medicine

## 2017-01-06 DIAGNOSIS — N390 Urinary tract infection, site not specified: Secondary | ICD-10-CM | POA: Diagnosis not present

## 2017-01-06 DIAGNOSIS — Z436 Encounter for attention to other artificial openings of urinary tract: Secondary | ICD-10-CM | POA: Diagnosis not present

## 2017-01-07 DIAGNOSIS — C678 Malignant neoplasm of overlapping sites of bladder: Secondary | ICD-10-CM | POA: Diagnosis not present

## 2017-01-08 DIAGNOSIS — Z436 Encounter for attention to other artificial openings of urinary tract: Secondary | ICD-10-CM | POA: Diagnosis not present

## 2017-01-08 DIAGNOSIS — N390 Urinary tract infection, site not specified: Secondary | ICD-10-CM | POA: Diagnosis not present

## 2017-01-11 ENCOUNTER — Telehealth: Payer: Self-pay | Admitting: *Deleted

## 2017-01-11 ENCOUNTER — Ambulatory Visit (INDEPENDENT_AMBULATORY_CARE_PROVIDER_SITE_OTHER): Payer: Medicare Other | Admitting: Internal Medicine

## 2017-01-11 DIAGNOSIS — R509 Fever, unspecified: Secondary | ICD-10-CM | POA: Diagnosis not present

## 2017-01-11 DIAGNOSIS — N390 Urinary tract infection, site not specified: Secondary | ICD-10-CM

## 2017-01-11 NOTE — Assessment & Plan Note (Signed)
It is not entirely clear what is causing his acute fever, chills and sweats paternal concerned about the possibility of a recurrent urinary tract infection. It is also possible that this is related to a wound infection. Given that the fevers have developed while taking ciprofloxacin I'm certainly concerned about the possibility of infection with more multidrug pathogens. He will continue the ciprofloxacin for now pending results of CBC, complete metabolic panel, urine and blood cultures.

## 2017-01-11 NOTE — Telephone Encounter (Signed)
Patient called, stated he has called [facility], but has not yet heard back from them. He developed fever over night with chills and back pain. He states his urine output is cloudy. He had surgical procedure 10/29 removing nephrostomy tube and stretching a stricture. He is concerned about post operative infection, is requesting an appointment with Dr Megan Salon if possible. OK per Dr Megan Salon for patient to come in today at 3, patient accepted appointment. Landis Gandy, RN

## 2017-01-11 NOTE — Progress Notes (Signed)
[place] for Infectious Disease  Patient Active Problem List   Diagnosis Date Noted  . Recurrent urinary tract infection 11/13/2016    Priority: High  . 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 Fever 05/16/2016    Priority: High  . History of bladder cancer 06/18/2016    Priority: Medium  . History of prostate cancer 06/18/2016    Priority: Medium  . Sepsis ([place]) 11/13/2016  . Chronic diastolic CHF (congestive heart failure) ([place]) 11/12/2016  . UTI (urinary tract infection) 10/26/2016  . AKI (acute kidney injury) ([place]) 10/26/2016  . Complicated UTI (urinary tract infection) 10/26/2016  . Fatigue 08/19/2016  . Malnutrition of moderate degree 08/05/2016  . SIRS (systemic inflammatory response syndrome) ([place]) 08/04/2016  . Carotid bruit 02/17/2016  . Prediabetes 08/20/2015  . Overweight (BMI 25.0-29.9) 08/20/2015  . Esophageal reflux 08/20/2015  . Glaucoma 02/14/2015  . Varicose veins of lower extremities with other complications 41/93/7902  . RAD (reactive airway disease) 10/27/2011  . 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 Anemia, unspecified 10/27/2011  . 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 ERECTILE DYSFUNCTION 04/23/2010  . [place] DISEASE, LUMBOSACRAL SPINE 02/06/2010  . 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 Depression 04/12/2008  . Allergic rhinitis 04/12/2008  . Hyperlipidemia 10/05/2006  . Sarcoidosis ([place]) 07/05/2006      Medication List        Accurate as of 01/11/17  5:21 PM. Always use your most recent med list.          ciprofloxacin 500 MG tablet Commonly known as:  CIPRO   Fluticasone-Salmeterol 250-50 MCG/DOSE Aepb Commonly known as:  ADVAIR   GLUCOSAMINE CHOND DOUBLE STR PO   magnesium oxide 400 MG tablet Commonly known as:  MAG-OX   methenamine 1 g tablet Commonly known as:  MANDELAMINE Take 1 tablet (1,000 mg total) by mouth 2 (two) times daily.   montelukast 10 MG  tablet Commonly known as:  SINGULAIR TAKE ONE TABLET BY MOUTH ONCE DAILY   NASACORT ALLERGY 24HR 55 MCG/ACT Aero nasal inhaler Generic drug:  triamcinolone   * ranitidine 150 MG tablet Commonly known as:  ZANTAC Take 1 tablet (150 mg total) by mouth 2 (two) times daily.   * ranitidine 150 MG tablet Commonly known as:  ZANTAC TAKE 1 TABLET BY MOUTH AT BEDTIME   venlafaxine 75 MG tablet Commonly known as:  EFFEXOR TAKE 1 TABLET BY MOUTH ONCE DAILY      * This list has 2 medication(s) that are the same as other medications prescribed for you. Read the directions carefully, and ask your doctor or other care provider to review them with you.          Subjective: Mr. Pellow is in for his routine hospital follow-up. He is a 76 y.o. male who underwent robotic cystoprostatectomy and ileal conduit formation in April of this year for bladder and prostate cancer. His ureteral stents were removed about one month postop. He has had problems with recurrent urinary tract infection since that time and was admitted in early June and again and late August. He estimates that he has been treated 7-8 times for urinary tract infection since his surgery. I do not have access to any outpatient urine culture results but urine cultures that have been done here show the following:  Urine culture 08/04/2016: Pansensitive Escherichia coli Urine culture 08/21/2016: ESBL producing Escherichia coli Urine culture 10/25/2016: Multiple species Urine culture 10/29/2016: Yeast  Urine culture 11/18/2016: ESBL producing Escherichia coli  When he was last discharged on 10/28/2016 he was prescribed ciprofloxacin and methenamine. He had a CT scan on 10/27/2016 which showed:  CT abdomen and pelvis with contrast  PRESSION: 1. Ileal conduit with hydronephrosis and hydroureter of the LEFT renal collecting system. No evidence of obstruction of the conduit. 2. Several small filling defects within the ileal conduit  are favored debris. 3. Incidental findings of atherosclerosis and diverticulosis.  By: Suzy Bouchard M.D. On: 10/27/2016 11:32  He was admitted again on 11/11/2016 with fever and malaise. He also had some left flank pain similar to previous episodes. He received 1 dose of IV vancomycin and piperacillin tazobactam in the emergency department and was started on fluconazole. He improved promptly.  A nuclear medicine scan showed decreased perfusion to the left kidney suggesting obstruction. A left percutaneous nephrostomy tube was placed on 11/19/2016. He was evaluated at [facility] and underwent repair of the left ureteral stricture on 12/28/2016. His percutaneous nephrostomy tube was removed. He returned for a routine follow-up visit on 01/07/2017. His ureteral stents were removed. One area of his wound was opened up and packed. He was not told that he had any wound infection but he was started on oral ciprofloxacin that day. He was told to take it for 1 week.  He felt well when he went to bed last night but woke up with fever, chills and sweats. He did not take his temperature overnight but when he got up this morning it was 100.8. Today he has felt very weak with diffuse aching pain and headache. He is also developed left lower back pain similar to what he had with previous episodes of urinary tract infections.  Review of Systems: Review of Systems  Constitutional: Positive for chills, diaphoresis, fever and malaise/fatigue.  HENT: Negative for congestion and sore throat.   Respiratory: Negative for cough, sputum production and wheezing.   Cardiovascular: Negative for chest pain.  Gastrointestinal: Negative for abdominal pain, diarrhea, nausea and vomiting.  Musculoskeletal:       Left lower back pain today.  Skin: Negative for rash.  Neurological: Positive for weakness and headaches. Negative for dizziness.    Past Medical History:  Diagnosis Date  .  Allergic rhinitis   . Arthritis   . Benign localized prostatic hyperplasia with lower urinary tract symptoms (LUTS)   . Bladder tumor   . Borderline glaucoma of right eye   . Cancer [facility])    bladder and prostate  . Carotid bruit    per duplex 03-11-2016 RICA 1-39%  . Chronic throat clearing   . DDD (degenerative disc disease), lumbar   . Depression   . ED (erectile dysfunction)   . Elevated PSA    urologist-  dr [name]--- s/p  prostate bx's  . GERD (gastroesophageal reflux disease)   . Hematuria   . History of chronic bronchitis   . 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 History of squamous cell carcinoma excision    several times  . Hyperlipidemia   . Pre-diabetes   . Premature ventricular contractions (PVCs) (VPCs)   . RAD (reactive airway disease)   . 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 Sensorineural hearing loss (SNHL) of both ears   . Sepsis ([place]) 09/2016  . UTI (urinary tract  infection) 08/2016  . Wears glasses   . Wears hearing aid    bilateral    Social History   Tobacco Use  . Smoking status: Never Smoker  . Smokeless tobacco: Never Used  Substance Use Topics  . Alcohol use: No  . Drug use: No    Family History  Problem Relation Age of Onset  . Stroke Mother        in her 68s  . Breast cancer Sister   . Heart disease Maternal Grandmother 84       MI   . Breast cancer Paternal Grandmother   . Heart disease Paternal Grandfather 37       MI  . Stroke Brother   . Diabetes Neg Hx     Allergies  Allergen Reactions  . Ivp Dye [Iodinated Diagnostic Agents] Shortness Of Breath, Itching and Palpitations    "eyes itching,  Heart racing,  Effected breathing" 10-27-16 pt with 13 hr pre-meds for CT without any reaction-kj  . Shellfish Allergy Hives, Shortness Of Breath and Itching    Mostly crab  . Gabapentin Other (See Comments)     REACTION: dizziness and flushing  . Niacin-Lovastatin Er Other (See Comments)    Did not feel good on medication  . Adhesive [Tape] Rash    Use paper tape    Objective: Vitals:   01/11/17 1512  BP: 108/67  Pulse: 94  Temp: 98.5 F (36.9 C)  TempSrc: Oral  Weight: 158 lb (71.7 kg)   Body mass index is 24.75 kg/m.  Physical Exam  Constitutional: He is oriented to person, place, and time. No distress.  He is in no distress. He is accompanied by his wife.  Cardiovascular: Normal rate and regular rhythm.  No murmur heard. Pulmonary/Chest: Effort normal and breath sounds normal. He has no wheezes. He has no rales.  Abdominal: Soft. He exhibits no distension. There is no tenderness.  He has a clean dry gauze dressing over the open portion of his midline abdominal wound. There is clear yellow urine in his ileal conduit bag. His previous left flank nephrostomy site is healing well. He has no CVA tenderness with percussion.  Neurological: He is alert and oriented to person, place, and time.  Skin: No rash noted.  Psychiatric: Mood and affect normal.      Problem List Items Addressed This Visit      High   Fever    It is not entirely clear what is causing his acute fever, chills and sweats paternal concerned about the possibility of a recurrent urinary tract infection. It is also possible that this is related to a wound infection. Given that the fevers have developed while taking ciprofloxacin I'm certainly concerned about the possibility of infection with more multidrug pathogens. He will continue the ciprofloxacin for now pending results of CBC, complete metabolic panel, urine and blood cultures.      Recurrent urinary tract infection   Relevant Orders   CBC   Basic metabolic panel   Urine Culture   Culture, blood (single)   Culture, blood (single)       Michel Bickers, MD [facility] for Infectious [place] [facility] [phone] pager   [phone]  cell 01/11/2017, 5:21 PM

## 2017-01-12 ENCOUNTER — Telehealth: Payer: Self-pay | Admitting: Internal Medicine

## 2017-01-12 DIAGNOSIS — N390 Urinary tract infection, site not specified: Secondary | ICD-10-CM | POA: Diagnosis not present

## 2017-01-12 DIAGNOSIS — Z436 Encounter for attention to other artificial openings of urinary tract: Secondary | ICD-10-CM | POA: Diagnosis not present

## 2017-01-12 LAB — BASIC METABOLIC PANEL
BUN: 18 mg/dL (ref 7–25)
CALCIUM: 9.9 mg/dL (ref 8.6–10.3)
CO2: 24 mmol/L (ref 20–32)
Chloride: 109 mmol/L (ref 98–110)
Creat: 1.11 mg/dL (ref 0.70–1.18)
GLUCOSE: 98 mg/dL (ref 65–99)
POTASSIUM: 4.8 mmol/L (ref 3.5–5.3)
SODIUM: 142 mmol/L (ref 135–146)

## 2017-01-12 LAB — CBC
HCT: 35.8 % — ABNORMAL LOW (ref 38.5–50.0)
Hemoglobin: 11.5 g/dL — ABNORMAL LOW (ref 13.2–17.1)
MCH: 26.8 pg — ABNORMAL LOW (ref 27.0–33.0)
MCHC: 32.1 g/dL (ref 32.0–36.0)
MCV: 83.4 fL (ref 80.0–100.0)
MPV: 9.5 fL (ref 7.5–12.5)
PLATELETS: 909 10*3/uL — AB (ref 140–400)
RBC: 4.29 10*6/uL (ref 4.20–5.80)
RDW: 14.1 % (ref 11.0–15.0)
WBC: 13.9 10*3/uL — AB (ref 3.8–10.8)

## 2017-01-12 NOTE — Telephone Encounter (Signed)
I called Mr. Adrian Rios this afternoon. He did not have any fever last night. He still feels achy and run down but overall a little bit better. He started taking Tylenol and Advil around the clock so that may be suppressing some fever. So far her urine and blood cultures are negative. He is finishing up the 7 day course of ciprofloxacin he was put on last week when he saw his urologist at [facility] I will be out of town for the next several days but I will have my partner, Dr. Tommy Medal, check his cultures and follow-up with him in my absence.

## 2017-01-13 ENCOUNTER — Telehealth: Payer: Self-pay | Admitting: *Deleted

## 2017-01-13 NOTE — Telephone Encounter (Signed)
Patient's wife calling for lab results. Please advise

## 2017-01-14 ENCOUNTER — Telehealth: Payer: Self-pay | Admitting: Infectious Disease

## 2017-01-14 LAB — URINE CULTURE
MICRO NUMBER:: 81271996
SPECIMEN QUALITY:: ADEQUATE

## 2017-01-14 MED ORDER — NITROFURANTOIN MONOHYD MACRO 100 MG PO CAPS: 100.0000 mg | ORAL_CAPSULE | Freq: Two times a day (BID) | ORAL | 1 refills | Status: AC

## 2017-01-14 NOTE — Telephone Encounter (Signed)
Pts blood cultures sterile. His urine from conduit is growing an ESBL  It is S to macrobid and I will send that in for 7 days.  He is NOT systemically ill at this point.   I have asked that he stop the cipro and to followup with Dr. Megan Salon in the next month

## 2017-01-14 NOTE — Telephone Encounter (Signed)
Perfect

## 2017-01-14 NOTE — Telephone Encounter (Signed)
Spoke with patient per Dr Tommy Medal -  relayed results, medication prescribed, and made follow up appointment for 12/6 with Dr Megan Salon.  Patient verbalized understand, agreement. Landis Gandy, RN

## 2017-01-15 DIAGNOSIS — N390 Urinary tract infection, site not specified: Secondary | ICD-10-CM | POA: Diagnosis not present

## 2017-01-15 DIAGNOSIS — Z436 Encounter for attention to other artificial openings of urinary tract: Secondary | ICD-10-CM | POA: Diagnosis not present

## 2017-01-17 ENCOUNTER — Encounter: Payer: Self-pay | Admitting: Internal Medicine

## 2017-01-17 LAB — CULTURE, BLOOD (SINGLE)
MICRO NUMBER: 81271891
MICRO NUMBER:: 81271867
RESULT: NO GROWTH
RESULT: NO GROWTH
SPECIMEN QUALITY: ADEQUATE
SPECIMEN QUALITY:: ADEQUATE

## 2017-01-18 DIAGNOSIS — N390 Urinary tract infection, site not specified: Secondary | ICD-10-CM | POA: Diagnosis not present

## 2017-01-18 DIAGNOSIS — T8131XD Disruption of external operation (surgical) wound, not elsewhere classified, subsequent encounter: Secondary | ICD-10-CM | POA: Diagnosis not present

## 2017-01-19 DIAGNOSIS — C678 Malignant neoplasm of overlapping sites of bladder: Secondary | ICD-10-CM | POA: Diagnosis not present

## 2017-01-19 DIAGNOSIS — N99524 Stenosis of incontinent stoma of urinary tract: Secondary | ICD-10-CM | POA: Diagnosis not present

## 2017-01-19 DIAGNOSIS — N1330 Unspecified hydronephrosis: Secondary | ICD-10-CM | POA: Diagnosis not present

## 2017-01-19 NOTE — Telephone Encounter (Signed)
Spoke with patient's wife Dianah Field. Patient is MUCH better today, had a wonderful follow up at [facility] today too.

## 2017-01-20 DIAGNOSIS — T8131XD Disruption of external operation (surgical) wound, not elsewhere classified, subsequent encounter: Secondary | ICD-10-CM | POA: Diagnosis not present

## 2017-01-20 DIAGNOSIS — N390 Urinary tract infection, site not specified: Secondary | ICD-10-CM | POA: Diagnosis not present

## 2017-01-25 DIAGNOSIS — N390 Urinary tract infection, site not specified: Secondary | ICD-10-CM | POA: Diagnosis not present

## 2017-01-25 DIAGNOSIS — Z8551 Personal history of malignant neoplasm of bladder: Secondary | ICD-10-CM | POA: Diagnosis not present

## 2017-01-25 DIAGNOSIS — K219 Gastro-esophageal reflux disease without esophagitis: Secondary | ICD-10-CM | POA: Diagnosis not present

## 2017-01-25 DIAGNOSIS — F329 Major depressive disorder, single episode, unspecified: Secondary | ICD-10-CM | POA: Diagnosis not present

## 2017-01-25 DIAGNOSIS — E785 Hyperlipidemia, unspecified: Secondary | ICD-10-CM | POA: Diagnosis not present

## 2017-01-25 DIAGNOSIS — I5032 Chronic diastolic (congestive) heart failure: Secondary | ICD-10-CM | POA: Diagnosis not present

## 2017-01-25 DIAGNOSIS — Z436 Encounter for attention to other artificial openings of urinary tract: Secondary | ICD-10-CM | POA: Diagnosis not present

## 2017-01-25 DIAGNOSIS — H409 Unspecified glaucoma: Secondary | ICD-10-CM | POA: Diagnosis not present

## 2017-01-25 DIAGNOSIS — D869 Sarcoidosis, unspecified: Secondary | ICD-10-CM | POA: Diagnosis not present

## 2017-01-25 DIAGNOSIS — E440 Moderate protein-calorie malnutrition: Secondary | ICD-10-CM | POA: Diagnosis not present

## 2017-01-25 DIAGNOSIS — Z8546 Personal history of malignant neoplasm of prostate: Secondary | ICD-10-CM | POA: Diagnosis not present

## 2017-01-25 DIAGNOSIS — T8131XD Disruption of external operation (surgical) wound, not elsewhere classified, subsequent encounter: Secondary | ICD-10-CM | POA: Diagnosis not present

## 2017-01-29 DIAGNOSIS — N390 Urinary tract infection, site not specified: Secondary | ICD-10-CM | POA: Diagnosis not present

## 2017-01-29 DIAGNOSIS — T8131XD Disruption of external operation (surgical) wound, not elsewhere classified, subsequent encounter: Secondary | ICD-10-CM | POA: Diagnosis not present

## 2017-02-03 ENCOUNTER — Other Ambulatory Visit: Payer: Self-pay | Admitting: Internal Medicine

## 2017-02-03 DIAGNOSIS — N390 Urinary tract infection, site not specified: Secondary | ICD-10-CM | POA: Diagnosis not present

## 2017-02-03 DIAGNOSIS — T8131XD Disruption of external operation (surgical) wound, not elsewhere classified, subsequent encounter: Secondary | ICD-10-CM | POA: Diagnosis not present

## 2017-02-04 ENCOUNTER — Ambulatory Visit (INDEPENDENT_AMBULATORY_CARE_PROVIDER_SITE_OTHER): Payer: Medicare Other | Admitting: Internal Medicine

## 2017-02-04 DIAGNOSIS — N390 Urinary tract infection, site not specified: Secondary | ICD-10-CM | POA: Diagnosis not present

## 2017-02-04 NOTE — Assessment & Plan Note (Signed)
He is doing well after completing his most recent round of antibiotics for his recurrent urinary tract infections. This last episode may have been triggered by removal of his ureteral stents. Hopefully his last surgery to reimplant his ureters will reduce the chances of continuing, recurrent infections. However, he knows he can follow-up here as needed.

## 2017-02-04 NOTE — Progress Notes (Signed)
[facility]rian [facility] for Infectious Disease  Patient Active Problem List   Diagnosis Date Noted  . Recurrent urinary tract infection 11/13/2016    Priority: High  . 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 Fever 05/16/2016    Priority: High  . History of bladder cancer 06/18/2016    Priority: Medium  . History of prostate cancer 06/18/2016    Priority: Medium  . Sepsis ([place]) 11/13/2016  . Chronic diastolic CHF (congestive heart failure) ([place]) 11/12/2016  . UTI (urinary tract infection) 10/26/2016  . AKI (acute kidney injury) ([place]) 10/26/2016  . Complicated UTI (urinary tract infection) 10/26/2016  . Fatigue 08/19/2016  . Malnutrition of moderate degree 08/05/2016  . SIRS (systemic inflammatory response syndrome) ([place]) 08/04/2016  . Carotid bruit 02/17/2016  . Prediabetes 08/20/2015  . Overweight (BMI 25.0-29.9) 08/20/2015  . Esophageal reflux 08/20/2015  . Glaucoma 02/14/2015  . Varicose veins of lower extremities with other complications 91/47/8295  . RAD (reactive airway disease) 10/27/2011  . 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 Anemia, unspecified 10/27/2011  . 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 ERECTILE DYSFUNCTION 04/23/2010  . [place] DISEASE, LUMBOSACRAL SPINE 02/06/2010  . 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 Depression 04/12/2008  . Allergic rhinitis 04/12/2008  . Hyperlipidemia 10/05/2006  . Sarcoidosis ([place]) 07/05/2006      Medication List        Accurate as of 02/04/17  2:26 PM. Always use your most recent med list.          * Fluticasone-Salmeterol 250-50 MCG/DOSE Aepb Commonly known as:  ADVAIR   * Fluticasone-Salmeterol 250-50 MCG/DOSE Aepb Commonly known as:  ADVAIR DISKUS INHALE 1 DOSE BY MOUTH EVERY 12 HOURS   GLUCOSAMINE CHOND DOUBLE STR PO   magnesium oxide 400 MG tablet Commonly known as:  MAG-OX   methenamine 1 g tablet Commonly known as:  MANDELAMINE Take 1 tablet (1,000 mg total)  by mouth 2 (two) times daily.   montelukast 10 MG tablet Commonly known as:  SINGULAIR TAKE ONE TABLET BY MOUTH ONCE DAILY   NASACORT ALLERGY 24HR 55 MCG/ACT Aero nasal inhaler Generic drug:  triamcinolone   * ranitidine 150 MG tablet Commonly known as:  ZANTAC Take 1 tablet (150 mg total) by mouth 2 (two) times daily.   * ranitidine 150 MG tablet Commonly known as:  ZANTAC TAKE 1 TABLET BY MOUTH AT BEDTIME   venlafaxine 75 MG tablet Commonly known as:  EFFEXOR TAKE 1 TABLET BY MOUTH ONCE DAILY      * This list has 4 medication(s) that are the same as other medications prescribed for you. Read the directions carefully, and ask your doctor or other care provider to review them with you.          Subjective: Adrian Rios is in with his wife for his routine follow-up visit. He is feeling much better. He recently developed an ESBL producing Escherichia coli UTI. He was treated with Macrodantin from 01/14/2017-01/25/2017. His back pain and fever resolved promptly. He has done well since that time and says that this is the best he has felt in the past year. He [facility].  Review of Systems: Review of Systems  Constitutional: Negative for chills, diaphoresis and fever.  Gastrointestinal: Negative for abdominal pain, diarrhea, nausea and vomiting.  Musculoskeletal: Negative for back pain.    Past Medical History:  Diagnosis Date  . Allergic rhinitis   .  Arthritis   . Benign localized prostatic hyperplasia with lower urinary tract symptoms (LUTS)   . Bladder tumor   . Borderline glaucoma of right eye   . Cancer [facility])    bladder and prostate  . Carotid bruit    per duplex 03-11-2016 RICA 1-39%  . Chronic throat clearing   . DDD (degenerative disc disease), lumbar   . Depression   . ED (erectile dysfunction)   . Elevated PSA    urologist-  dr [name]--- s/p  prostate bx's  . GERD (gastroesophageal reflux disease)   . Hematuria   . History of  chronic bronchitis   . 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 History of squamous cell carcinoma excision    several times  . Hyperlipidemia   . Pre-diabetes   . Premature ventricular contractions (PVCs) (VPCs)   . RAD (reactive airway disease)   . 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 Sensorineural hearing loss (SNHL) of both ears   . Sepsis ([place]) 09/2016  . UTI (urinary tract infection) 08/2016  . Wears glasses   . Wears hearing aid    bilateral    Social History   Tobacco Use  . Smoking status: Never Smoker  . Smokeless tobacco: Never Used  Substance Use Topics  . Alcohol use: No  . Drug use: No    Family History  Problem Relation Age of Onset  . Stroke Mother        in her 58s  . Breast cancer Sister   . Heart disease Maternal Grandmother 84       MI   . Breast cancer Paternal Grandmother   . Heart disease Paternal Grandfather 61       MI  . Stroke Brother   . Diabetes Neg Hx     Allergies  Allergen Reactions  . Ivp Dye [Iodinated Diagnostic Agents] Shortness Of Breath, Itching and Palpitations    "eyes itching,  Heart racing,  Effected breathing" 10-27-16 pt with 13 hr pre-meds for CT without any reaction-kj  . Shellfish Allergy Hives, Shortness Of Breath and Itching    Mostly crab  . Gabapentin Other (See Comments)    REACTION: dizziness and flushing  . Niacin-Lovastatin Er Other (See Comments)    Did not feel good on medication  . Adhesive [Tape] Rash    Use paper tape    Objective: Vitals:   02/04/17 1407  BP: 114/71  Pulse: 80  Temp: (!) 97.4 F (36.3 C)  TempSrc: Oral  Weight: 157 lb (71.2 kg)   Body mass index is 24.59 kg/m.  Physical Exam  Constitutional: He is oriented to person, place, and time.  He is in good spirits.  Cardiovascular: Normal rate and regular rhythm.  No murmur heard. Pulmonary/Chest: Effort normal and  breath sounds normal.  Abdominal: Soft. He exhibits no distension. There is no tenderness.  No CVA tenderness.  Neurological: He is alert and oriented to person, place, and time.  Skin: No rash noted.  Psychiatric: Mood and affect normal.    Lab Results    Problem List Items Addressed This Visit      High   Recurrent urinary tract infection    He is doing well after completing his most recent round of antibiotics for his recurrent urinary tract infections. This last episode may have  been triggered by removal of his ureteral stents. Hopefully his last surgery to reimplant his ureters will reduce the chances of continuing, recurrent infections. However, he knows he can follow-up here as needed.          Michel Bickers, MD [facility] [phone] pager   [phone] cell 02/04/2017, 2:26 PM

## 2017-02-12 DIAGNOSIS — N390 Urinary tract infection, site not specified: Secondary | ICD-10-CM | POA: Diagnosis not present

## 2017-02-12 DIAGNOSIS — T8131XD Disruption of external operation (surgical) wound, not elsewhere classified, subsequent encounter: Secondary | ICD-10-CM | POA: Diagnosis not present

## 2017-02-17 ENCOUNTER — Ambulatory Visit: Payer: Medicare Other | Admitting: Internal Medicine

## 2017-02-17 DIAGNOSIS — Z906 Acquired absence of other parts of urinary tract: Secondary | ICD-10-CM | POA: Diagnosis not present

## 2017-02-17 DIAGNOSIS — N1330 Unspecified hydronephrosis: Secondary | ICD-10-CM | POA: Diagnosis not present

## 2017-02-17 DIAGNOSIS — C61 Malignant neoplasm of prostate: Secondary | ICD-10-CM | POA: Diagnosis not present

## 2017-02-17 DIAGNOSIS — C678 Malignant neoplasm of overlapping sites of bladder: Secondary | ICD-10-CM | POA: Diagnosis not present

## 2017-02-17 DIAGNOSIS — Z9079 Acquired absence of other genital organ(s): Secondary | ICD-10-CM | POA: Diagnosis not present

## 2017-03-18 ENCOUNTER — Telehealth: Payer: Self-pay | Admitting: Internal Medicine

## 2017-03-18 MED ORDER — RANITIDINE HCL 150 MG PO TABS: 150.0000 mg | ORAL_TABLET | Freq: Two times a day (BID) | ORAL | 1 refills | Status: DC

## 2017-03-18 NOTE — Telephone Encounter (Signed)
Reviewed chart pt is up-to-date sent refills to [facility].../lmb  

## 2017-03-18 NOTE — Telephone Encounter (Signed)
Copied from [place] [phone] [phone]. Topic: Quick Communication - Rx Refill/Question >> Mar 18, 2017 11:45 AM Scherrie Gerlach wrote: Medication:  ranitidine (ZANTAC) 150 MG tablet  Has the patient contacted their pharmacy? Yes  Pt states the dr increased the dose to BID, and now pt needs a new rx for the new amount. Pharmacy [facility], [place] [phone] (Phone) [phone] (Fax)

## 2017-03-18 NOTE — Telephone Encounter (Signed)
Spoke with Aniceto Boss from [facility], who states that the only prescription received was for Raintidine 150mg  at bedtime, which was last filled on 01/06/17. Another prescription for Ranitidine 150mg  was written on 11/27/16 to take twice a day, but the pharmacy did not receive that prescription. Pt is requesting refill of zantac and states that he was told by [name] to take twice a day.

## 2017-04-09 ENCOUNTER — Other Ambulatory Visit: Payer: Self-pay | Admitting: Internal Medicine

## 2017-05-25 NOTE — Patient Instructions (Addendum)
  Medications reviewed and updated.  No changes recommended at this time.     Please followup in 6 months   

## 2017-05-25 NOTE — Progress Notes (Signed)
Subjective:    Patient ID: Adrian Rios, male    DOB: [birth year], 77 y.o.   MRN: [id number]  HPI The patient is here for follow up.  ? Left sided abdominal hernia:  The left side of his abdomen is bulged out. He denies discomfort.  He wonders about a possible hernia.  He did gain weight after his surgery last year-urostomy.  Gets cold in the middle of the night around 4-5 am.  He states this does not happen every night, but putting an extra blanket on helps.  He denies any fevers, chills or concerning changes in temperature during the day.  Prediabetes:  He is compliant with a low sugar/carbohydrate diet.  He is exercising regularly - walks dog almost daily - 30 minutes.  Depression: He is taking his medication daily as prescribed. He denies any side effects from the medication. He feels his depression is well controlled and he is happy with his current dose of medication.   GERD:  He is taking his medication daily as prescribed.  He denies any GERD symptoms and feels his GERD is well controlled.   Reactive airway disease:  He takes the advair once a day.  He takes the singulair and flonase daily.  He denies any cough, wheeze and sob.   Medications and allergies reviewed with patient and updated if appropriate.  Patient Active Problem List   Diagnosis Date Noted  . Recurrent urinary tract infection 11/13/2016  . Sepsis ([place]) 11/13/2016  . Chronic diastolic CHF (congestive heart failure) ([place]) 11/12/2016  . History of bladder cancer 06/18/2016  . History of prostate cancer 06/18/2016  . 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 Carotid bruit 02/17/2016  . Prediabetes 08/20/2015  . Overweight (BMI 25.0-29.9) 08/20/2015  . Esophageal reflux 08/20/2015  . Glaucoma 02/14/2015  . Varicose veins of lower extremities with other complications 42/35/3614  . RAD (reactive airway disease) 10/27/2011  . 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 PREMATURE VENTRICULAR CONTRACTIONS 04/29/2010   . ERECTILE DYSFUNCTION 04/23/2010  . [place] DISEASE, LUMBOSACRAL SPINE 02/06/2010  . 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 Depression 04/12/2008  . Allergic rhinitis 04/12/2008  . Hyperlipidemia 10/05/2006  . Sarcoidosis ([place]) 07/05/2006    Current Outpatient Medications on File Prior to Visit  Medication Sig Dispense Refill  . Fluticasone-Salmeterol (ADVAIR DISKUS) 250-50 MCG/DOSE AEPB INHALE 1 DOSE BY MOUTH EVERY 12 HOURS 60 each 1  . magnesium oxide (MAG-OX) 400 MG tablet Take 400 mg by mouth daily.    . methenamine (MANDELAMINE) 1 g tablet Take 1 tablet (1,000 mg total) by mouth 2 (two) times daily. 60 tablet 0  . Misc Natural Products (GLUCOSAMINE CHOND DOUBLE STR PO) Take 1 tablet by mouth 2 (two) times daily.    . montelukast (SINGULAIR) 10 MG tablet TAKE ONE TABLET BY MOUTH ONCE DAILY 90 tablet 3  . ranitidine (ZANTAC) 150 MG tablet Take 1 tablet (150 mg total) by mouth 2 (two) times daily. 180 tablet 1  . triamcinolone (NASACORT ALLERGY 24HR) 55 MCG/ACT AERO nasal inhaler Place 1 spray into the nose at bedtime.    Marland Kitchen venlafaxine (EFFEXOR) 75 MG tablet TAKE 1 TABLET BY MOUTH ONCE DAILY 90 tablet 1   No current facility-administered medications on file prior to visit.     Past Medical History:  Diagnosis Date  . Allergic rhinitis   . Arthritis   . Benign localized prostatic hyperplasia with lower urinary tract symptoms (LUTS)   . Bladder tumor   .  Borderline glaucoma of right eye   . Cancer [facility])    bladder and prostate  . Carotid bruit    per duplex 03-11-2016 RICA 1-39%  . Chronic throat clearing   . DDD (degenerative disc disease), lumbar   . Depression   . ED (erectile dysfunction)   . Elevated PSA    urologist-  dr [name]--- s/p  prostate bx's  . GERD (gastroesophageal reflux disease)   . Hematuria   . History of chronic bronchitis   . 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 History of squamous cell carcinoma excision     several times  . Hyperlipidemia   . Pre-diabetes   . Premature ventricular contractions (PVCs) (VPCs)   . RAD (reactive airway disease)   . 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 Sensorineural hearing loss (SNHL) of both ears   . Sepsis ([place]) 09/2016  . UTI (urinary tract infection) 08/2016  . Wears glasses   . Wears hearing aid    bilateral    Past Surgical History:  Procedure Laterality Date  . APPENDECTOMY  2008  . CARDIOVASCULAR STRESS TEST  05/27/2010   normal nuclear study w/ no ischemia/  normal LV function and wall motion , ef 60%  . CYSTOSCOPY WITH INJECTION N/A 06/12/2016   Procedure: CYSTOSCOPY WITH INJECTION OF INDOCYANINE GREEN DYE;  Surgeon: Alexis Frock, MD;  Location: [facility];  Service: Urology;  Laterality: N/A;  . DEEP NECK LYMPH NODE BIOPSY / EXCISION  1970's   and Bronchoscopy w/ bx's ( dx Sarcoidosis)  . ILEOSTOMY    . IR NEPHROSTOMY PLACEMENT LEFT  11/18/2016  . MOHS SURGERY  2013 approx.    nasal; pre melanoma  . PARS PLANA VITRECTOMY Right 2007   repair macular pucker  . TONSILLECTOMY AND ADENOIDECTOMY  child  . TRANSURETHRAL RESECTION OF BLADDER TUMOR N/A 04/06/2016   Procedure: CYSTOSCOPY TRANSURETHRAL RESECTION OF BLADDER TUMORS (TURBT);  Surgeon: Carolan Clines, MD;  Location: [facility];  Service: Urology;  Laterality: N/A;  . TRANSURETHRAL RESECTION OF PROSTATE      Social History   Socioeconomic History  . Marital status: Married    Spouse name: Not on file  . Number of children: Not on file  . Years of education: Not on file  . Highest education level: Not on file  Occupational History  . Not on file  Social Needs  . Financial resource strain: Not hard at all  . Food insecurity:    Worry: Never true    Inability: Never true  . Transportation needs:    Medical: No    Non-medical: No  Tobacco Use  . Smoking status: Never Smoker  .  Smokeless tobacco: Never Used  Substance and Sexual Activity  . Alcohol use: No  . Drug use: No  . Sexual activity: Not Currently  Lifestyle  . Physical activity:    Days per week: 4 days    Minutes per session: 40 min  . Stress: Only a little  Relationships  . Social connections:    Talks on phone: More than three times a week    Gets together: More than three times a week    Attends religious service: More than 4 times per year    Active member of club or organization: Not on file    Attends  meetings of clubs or organizations: Not on file    Relationship status: Married  Other Topics Concern  . Not on file  Social History Narrative  . Not on file    Family History  Problem Relation Age of Onset  . Stroke Mother        in her 25s  . Breast cancer Sister   . Heart disease Maternal Grandmother 84       MI   . Breast cancer Paternal Grandmother   . Heart disease Paternal Grandfather 14       MI  . Stroke Brother   . Diabetes Neg Hx     Review of Systems  Constitutional: Negative for chills and fever.  Respiratory: Negative for cough, shortness of breath and wheezing.   Cardiovascular: Negative for chest pain, palpitations and leg swelling.  Gastrointestinal: Negative for abdominal pain and nausea.       GERD controlled  Neurological: Positive for headaches (occ). Negative for light-headedness.       Objective:   Vitals:   05/26/17 1117  BP: 112/64  Pulse: 80  Resp: 16  Temp: 98.3 F (36.8 C)  SpO2: 98%   BP Readings from Last 3 Encounters:  05/26/17 112/64  05/26/17 112/64  02/04/17 114/71   Wt Readings from Last 3 Encounters:  05/26/17 168 lb (76.2 kg)  05/26/17 168 lb (76.2 kg)  02/04/17 157 lb (71.2 kg)   Body mass index is 26.31 kg/m.   Physical Exam    Constitutional: Appears well-developed and well-nourished. No distress.  HENT:  Head: Normocephalic and atraumatic.  Neck: Neck supple. No tracheal deviation present. No thyromegaly  present.  No cervical lymphadenopathy Cardiovascular: Normal rate, regular rhythm and normal heart sounds.   No murmur heard. No carotid bruit .  No edema Pulmonary/Chest: Effort normal and breath sounds normal. No respiratory distress. No has no wheezes. No rales.  Abdomen: Urostomy right side of umbilicus, reducible abdominal wall hernia left side of umbilicus that is nontender, abdomen soft Skin: Skin is warm and dry. Not diaphoretic.  Psychiatric: Normal mood and affect. Behavior is normal.      Assessment & Plan:    See Problem List for Assessment and Plan of chronic medical problems.

## 2017-05-26 ENCOUNTER — Ambulatory Visit (INDEPENDENT_AMBULATORY_CARE_PROVIDER_SITE_OTHER): Payer: Medicare Other | Admitting: Internal Medicine

## 2017-05-26 ENCOUNTER — Encounter: Payer: Self-pay | Admitting: Internal Medicine

## 2017-05-26 ENCOUNTER — Ambulatory Visit (INDEPENDENT_AMBULATORY_CARE_PROVIDER_SITE_OTHER): Payer: Medicare Other | Admitting: *Deleted

## 2017-05-26 VITALS — BP 112/64 | HR 80 | Temp 98.3°F | Resp 18 | Ht 67.0 in | Wt 168.0 lb

## 2017-05-26 VITALS — BP 112/64 | HR 80 | Temp 98.3°F | Resp 16 | Ht 67.0 in | Wt 168.0 lb

## 2017-05-26 DIAGNOSIS — F329 Major depressive disorder, single episode, unspecified: Secondary | ICD-10-CM

## 2017-05-26 DIAGNOSIS — K439 Ventral hernia without obstruction or gangrene: Secondary | ICD-10-CM | POA: Diagnosis not present

## 2017-05-26 DIAGNOSIS — Z0000 Encounter for general adult medical examination without abnormal findings: Secondary | ICD-10-CM | POA: Diagnosis not present

## 2017-05-26 DIAGNOSIS — R7303 Prediabetes: Secondary | ICD-10-CM

## 2017-05-26 DIAGNOSIS — F32A Depression, unspecified: Secondary | ICD-10-CM

## 2017-05-26 DIAGNOSIS — J4520 Mild intermittent asthma, uncomplicated: Secondary | ICD-10-CM

## 2017-05-26 DIAGNOSIS — K219 Gastro-esophageal reflux disease without esophagitis: Secondary | ICD-10-CM | POA: Diagnosis not present

## 2017-05-26 NOTE — Assessment & Plan Note (Signed)
GERD controlled Continue daily medication  

## 2017-05-26 NOTE — Assessment & Plan Note (Signed)
Overall well controlled Continue Advair once daily, Singulair and Flonase

## 2017-05-26 NOTE — Assessment & Plan Note (Signed)
Sugars were well controlled in the fall He is compliant with a diabetic diet and is exercising regularly We will hold off on blood work today and recheck A1c in 6 months

## 2017-05-26 NOTE — Progress Notes (Addendum)
Subjective:   Adrian Rios is a 77 y.o. male who presents for Medicare Annual/Subsequent preventive examination.  Review of Systems:  No ROS.  Medicare Wellness Visit. Additional risk factors are reflected in the social history.  Cardiac Risk Factors include: advanced age (>89men, >84 women);dyslipidemia;male gender Sleep patterns: feels rested on waking, gets up 0 times nightly to void and sleeps 7 hours nightly.    Home Safety/Smoke Alarms: Feels safe in home. Smoke alarms in place.  Living environment; residence and Firearm Safety: 1-story house/ trailer, no firearms.Lives with wife, no needs for DME, good support system Seat Belt Safety/Bike Helmet: Wears seat belt.   PSA-  Lab Results  Component Value Date   PSA 0.29 07/11/2009       Objective:    Vitals: BP 112/64   Pulse 80   Temp 98.3 F (36.8 C)   Resp 18   Ht 5\' 7"  (1.702 m)   Wt 168 lb (76.2 kg)   SpO2 98%   BMI 26.31 kg/m   Body mass index is 26.31 kg/m.  Advanced Directives 05/26/2017 11/12/2016 11/11/2016 10/29/2016 10/29/2016 10/26/2016 10/25/2016  Does Patient Have a Medical Advance Directive? Yes No - No Yes Yes Yes  Type of Paramedic of [place];Living will Living will Living will Living will Living will;Healthcare Power of Attorney Living will;Healthcare Power of Attorney Living will;Healthcare Power of Attorney  Does patient want to make changes to medical advance directive? - No - Patient declined - - - No - Patient declined -  Copy of [place] in Chart? No - copy requested No - copy requested - - No - copy requested No - copy requested No - copy requested  Would patient like information on creating a medical advance directive? - No - Patient declined - - No - Patient declined - -    Tobacco Social History   Tobacco Use  Smoking Status Never Smoker  Smokeless Tobacco Never Used     Counseling given: Not Answered  Past Medical History:  Diagnosis Date    . Allergic rhinitis   . Arthritis   . Benign localized prostatic hyperplasia with lower urinary tract symptoms (LUTS)   . Bladder tumor   . Borderline glaucoma of right eye   . Cancer [facility])    bladder and prostate  . Carotid bruit    per duplex 03-11-2016 RICA 1-39%  . Chronic throat clearing   . DDD (degenerative disc disease), lumbar   . Depression   . ED (erectile dysfunction)   . Elevated PSA    urologist-  dr [name]--- s/p  prostate bx's  . GERD (gastroesophageal reflux disease)   . Hematuria   . History of chronic bronchitis   . 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 History of squamous cell carcinoma excision    several times  . Hyperlipidemia   . Pre-diabetes   . Premature ventricular contractions (PVCs) (VPCs)   . RAD (reactive airway disease)   . 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 Sensorineural hearing loss (SNHL) of both ears   . Sepsis ([place]) 09/2016  . UTI (urinary tract infection) 08/2016  . Wears glasses   . Wears hearing aid    bilateral   Past Surgical History:  Procedure Laterality Date  . APPENDECTOMY  2008  .  CARDIOVASCULAR STRESS TEST  05/27/2010   normal nuclear study w/ no ischemia/  normal LV function and wall motion , ef 60%  . CYSTOSCOPY WITH INJECTION N/A 06/12/2016   Procedure: CYSTOSCOPY WITH INJECTION OF INDOCYANINE GREEN DYE;  Surgeon: Alexis Frock, MD;  Location: [facility];  Service: Urology;  Laterality: N/A;  . DEEP NECK LYMPH NODE BIOPSY / EXCISION  1970's   and Bronchoscopy w/ bx's ( dx Sarcoidosis)  . ILEOSTOMY    . IR NEPHROSTOMY PLACEMENT LEFT  11/18/2016  . MOHS SURGERY  2013 approx.    nasal; pre melanoma  . PARS PLANA VITRECTOMY Right 2007   repair macular pucker  . TONSILLECTOMY AND ADENOIDECTOMY  child  . TRANSURETHRAL RESECTION OF BLADDER TUMOR N/A 04/06/2016   Procedure: CYSTOSCOPY TRANSURETHRAL RESECTION  OF BLADDER TUMORS (TURBT);  Surgeon: Carolan Clines, MD;  Location: [facility];  Service: Urology;  Laterality: N/A;  . TRANSURETHRAL RESECTION OF PROSTATE     Family History  Problem Relation Age of Onset  . Stroke Mother        in her 32s  . Breast cancer Sister   . Heart disease Maternal Grandmother 84       MI   . Breast cancer Paternal Grandmother   . Heart disease Paternal Grandfather 4       MI  . Stroke Brother   . Diabetes Neg Hx    Social History   Socioeconomic History  . Marital status: Married    Spouse name: Not on file  . Number of children: Not on file  . Years of education: Not on file  . Highest education level: Not on file  Occupational History  . Not on file  Social Needs  . Financial resource strain: Not hard at all  . Food insecurity:    Worry: Never true    Inability: Never true  . Transportation needs:    Medical: No    Non-medical: No  Tobacco Use  . Smoking status: Never Smoker  . Smokeless tobacco: Never Used  Substance and Sexual Activity  . Alcohol use: No  . Drug use: No  . Sexual activity: Not Currently  Lifestyle  . Physical activity:    Days per week: 4 days    Minutes per session: 40 min  . Stress: Only a little  Relationships  . Social connections:    Talks on phone: More than three times a week    Gets together: More than three times a week    Attends religious service: More than 4 times per year    Active member of club or organization: Not on file    Attends meetings of clubs or organizations: More than 4 times per year    Relationship status: Married  Other Topics Concern  . Not on file  Social History Narrative  . Not on file    Outpatient Encounter Medications as of 05/26/2017  Medication Sig  . Fluticasone-Salmeterol (ADVAIR DISKUS) 250-50 MCG/DOSE AEPB INHALE 1 DOSE BY MOUTH EVERY 12 HOURS  . magnesium oxide (MAG-OX) 400 MG tablet Take 400 mg by mouth daily.  . methenamine (MANDELAMINE) 1 g  tablet Take 1 tablet (1,000 mg total) by mouth 2 (two) times daily.  . Misc Natural Products (GLUCOSAMINE CHOND DOUBLE STR PO) Take 1 tablet by mouth 2 (two) times daily.  . montelukast (SINGULAIR) 10 MG tablet TAKE ONE TABLET BY MOUTH ONCE DAILY  . ranitidine (ZANTAC) 150 MG tablet Take 1 tablet (150 mg  total) by mouth 2 (two) times daily.  Marland Kitchen triamcinolone (NASACORT ALLERGY 24HR) 55 MCG/ACT AERO nasal inhaler Place 1 spray into the nose at bedtime.  Marland Kitchen venlafaxine (EFFEXOR) 75 MG tablet TAKE 1 TABLET BY MOUTH ONCE DAILY   No facility-administered encounter medications on file as of 05/26/2017.     Activities of Daily Living In your present state of health, do you have any difficulty performing the following activities: 05/26/2017 11/12/2016  Hearing? N Y  [place]? N Y  Comment - -  Difficulty concentrating or making decisions? N N  Walking or climbing stairs? N N  Dressing or bathing? N N  Doing errands, shopping? N N  Preparing Food and eating ? N -  Using the Toilet? N -  In the past six months, have you accidently leaked urine? N -  Do you have problems with loss of bowel control? N -  Managing your Medications? N -  Managing your Finances? N -  Housekeeping or managing your Housekeeping? N -  Some recent data might be hidden    Patient Care Team: [name], MD as PCP - General (Internal Medicine)   Assessment:   This is a routine wellness examination for [place]. Physical assessment deferred to PCP.   Exercise Activities and Dietary recommendations Current Exercise Habits: Home exercise routine, Type of exercise: walking, Time (Minutes): 30, Frequency (Times/Week): 5, Weekly Exercise (Minutes/Week): 150, Intensity: Mild, Exercise limited by: None identified  Diet (meal preparation, eat out, water intake, caffeinated beverages, dairy products, fruits and vegetables): in general, a "healthy" diet  , well balanced, eats a variety of fruits and vegetables daily,  limits salt, fat/cholesterol, sugar,carbohydrates,caffeine, drinks 6-8 glasses of water daily.  Goals    . Patient Stated     Continue to walk and eat healthy. Enjoy life and spend as much time as possible with my family and grandchildren.       Fall Risk Fall Risk  05/26/2017 02/04/2017 01/11/2017 08/19/2016 08/20/2015  Falls in the past year? No No No No No    Depression Screen PHQ 2/9 Scores 05/26/2017 02/04/2017 01/11/2017 08/19/2016  PHQ - 2 Score 1 0 0 0  PHQ- 9 Score 3 - - 7    Cognitive Function MMSE - Mini Mental State Exam 05/26/2017  Orientation to time 5  Orientation to Place 5  Registration 3  Attention/ Calculation 5  Recall 1  Language- name 2 objects 2  Language- repeat 1  Language- follow 3 step command 3  Language- read & follow direction 1  Write a sentence 1  Copy design 1  Total score 28        Immunization History  Administered Date(s) Administered  . H1N1 04/12/2008  . Influenza Split 12/16/2010, 01/06/2012  . Influenza Whole 02/03/2007, 12/21/2007, 12/05/2009  . Influenza, High Dose Seasonal PF 12/20/2012, 12/02/2015  . Influenza,inj,Quad PF,6+ Mos 12/07/2013  . Pneumococcal Conjugate-13 08/20/2015  . Pneumococcal Polysaccharide-23 03/09/2002, 12/28/2012  . Tetanus 02/22/2011  . Varicella 07/21/2005  . Zoster 04/25/2006   Screening Tests Health Maintenance  Topic Date Due  . INFLUENZA VACCINE  05/31/2017 (Originally 09/30/2016)  . TETANUS/TDAP  02/21/2021  . PNA vac Low Risk Adult  Completed      Plan:    Continue doing brain stimulating activities (puzzles, reading, adult coloring books, staying active) to keep memory sharp.   Continue to eat heart healthy diet (full of fruits, vegetables, whole grains, lean protein, water--limit salt, fat, and sugar intake)  and increase physical activity as tolerated.  I have personally reviewed and noted the following in the patient's chart:   . Medical and social history . Use of alcohol, tobacco  or illicit drugs  . Current medications and supplements . Functional ability and status . Nutritional status . Physical activity . Advanced directives . List of other physicians . Vitals . Screenings to include cognitive, depression, and falls . Referrals and appointments  In addition, I have reviewed and discussed with patient certain preventive protocols, quality metrics, and best practice recommendations. A written personalized care plan for preventive services as well as general preventive health recommendations were provided to patient.     Michiel Cowboy, RN  05/26/2017  Medical screening examination/treatment/procedure(s) were performed by non-physician practitioner and as supervising physician I was immediately available for consultation/collaboration. I agree with above. [name], MD

## 2017-05-26 NOTE — Assessment & Plan Note (Addendum)
Left abdomen near umbilicus-related to prior surgery No discomfort Will monitor Avoid weight gain No further evaluation or treatment needed

## 2017-05-26 NOTE — Patient Instructions (Addendum)
Continue doing brain stimulating activities (puzzles, reading, adult coloring books, staying active) to keep memory sharp.   Continue to eat heart healthy diet (full of fruits, vegetables, whole grains, lean protein, water--limit salt, fat, and sugar intake) and increase physical activity as tolerated.   Adrian Rios , Thank you for taking time to come for your Medicare Wellness Visit. I appreciate your ongoing commitment to your health goals. Please review the following plan we discussed and let me know if I can assist you in the future.   These are the goals we discussed: Goals    . Patient Stated     Continue to walk and eat healthy. Enjoy life and spend as much time as possible with my family and grandchildren.       This is a list of the screening recommended for you and due dates:  Health Maintenance  Topic Date Due  . Flu Shot  05/31/2017*  . Tetanus Vaccine  02/21/2021  . Pneumonia vaccines  Completed  *Topic was postponed. The date shown is not the original due date.     Health Maintenance, Male A healthy lifestyle and preventive care is important for your health and wellness. Ask your health care provider about what schedule of regular examinations is right for you. What should I know about weight and diet? Eat a Healthy Diet  Eat plenty of vegetables, fruits, whole grains, low-fat dairy products, and lean protein.  Do not eat a lot of foods high in solid fats, added sugars, or salt.  Maintain a Healthy Weight Regular exercise can help you achieve or maintain a healthy weight. You should:  Do at least 150 minutes of exercise each week. The exercise should increase your heart rate and make you sweat (moderate-intensity exercise).  Do strength-training exercises at least twice a week.  Watch Your Levels of Cholesterol and Blood Lipids  Have your blood tested for lipids and cholesterol every 5 years starting at 77 years of age. If you are at high risk for heart disease,  you should start having your blood tested when you are 77 years old. You may need to have your cholesterol levels checked more often if: ? Your lipid or cholesterol levels are high. ? You are older than 77 years of age. ? You are at high risk for heart disease.  What should I know about cancer screening? Many types of cancers can be detected early and may often be prevented. Lung Cancer  You should be screened every year for lung cancer if: ? You are a current smoker who has smoked for at least 30 years. ? You are a former smoker who has quit within the past 15 years.  Talk to your health care provider about your screening options, when you should start screening, and how often you should be screened.  Colorectal Cancer  Routine colorectal cancer screening usually begins at 77 years of age and should be repeated every 5-10 years until you are 77 years old. You may need to be screened more often if early forms of precancerous polyps or small growths are found. Your health care provider may recommend screening at an earlier age if you have risk factors for colon cancer.  Your health care provider may recommend using home test kits to check for hidden blood in the stool.  A small camera at the end of a tube can be used to examine your colon (sigmoidoscopy or colonoscopy). This checks for the earliest forms of colorectal cancer.  Prostate and Testicular Cancer  Depending on your age and overall health, your health care provider may do certain tests to screen for prostate and testicular cancer.  Talk to your health care provider about any symptoms or concerns you have about testicular or prostate cancer.  Skin Cancer  Check your skin from head to toe regularly.  Tell your health care provider about any new moles or changes in moles, especially if: ? There is a change in a mole's size, shape, or color. ? You have a mole that is larger than a pencil eraser.  Always use sunscreen. Apply  sunscreen liberally and repeat throughout the day.  Protect yourself by wearing long sleeves, pants, a wide-brimmed hat, and sunglasses when outside.  What should I know about heart disease, diabetes, and high blood pressure?  If you are 49-4 years of age, have your blood pressure checked every 3-5 years. If you are 26 years of age or older, have your blood pressure checked every year. You should have your blood pressure measured twice-once when you are at a hospital or clinic, and once when you are not at a hospital or clinic. Record the average of the two measurements. To check your blood pressure when you are not at a hospital or clinic, you can use: ? An automated blood pressure machine at a pharmacy. ? A home blood pressure monitor.  Talk to your health care provider about your target blood pressure.  If you are between 45-48 years old, ask your health care provider if you should take aspirin to prevent heart disease.  Have regular diabetes screenings by checking your fasting blood sugar level. ? If you are at a normal weight and have a low risk for diabetes, have this test once every three years after the age of 58. ? If you are overweight and have a high risk for diabetes, consider being tested at a younger age or more often.  A one-time screening for abdominal aortic aneurysm (AAA) by ultrasound is recommended for men aged 11-75 years who are current or former smokers. What should I know about preventing infection? Hepatitis B If you have a higher risk for hepatitis B, you should be screened for this virus. Talk with your health care provider to find out if you are at risk for hepatitis B infection. Hepatitis C Blood testing is recommended for:  Everyone born from [birth year] through 1965.  Anyone with known risk factors for hepatitis C.  Sexually Transmitted Diseases (STDs)  You should be screened each year for STDs including gonorrhea and chlamydia if: ? You are sexually active  and are younger than 77 years of age. ? You are older than 78 years of age and your health care provider tells you that you are at risk for this type of infection. ? Your sexual activity has changed since you were last screened and you are at an increased risk for chlamydia or gonorrhea. Ask your health care provider if you are at risk.  Talk with your health care provider about whether you are at high risk of being infected with HIV. Your health care provider may recommend a prescription medicine to help prevent HIV infection.  What else can I do?  Schedule regular health, dental, and eye exams.  Stay current with your vaccines (immunizations).  Do not use any tobacco products, such as cigarettes, chewing tobacco, and e-cigarettes. If you need help quitting, ask your health care provider.  Limit alcohol intake to no more than 2  drinks per day. One drink equals 12 ounces of beer, 5 ounces of wine, or 1 ounces of hard liquor.  Do not use street drugs.  Do not share needles.  Ask your health care provider for help if you need support or information about quitting drugs.  Tell your health care provider if you often feel depressed.  Tell your health care provider if you have ever been abused or do not feel safe at home. This information is not intended to replace advice given to you by your health care provider. Make sure you discuss any questions you have with your health care provider. Document Released: 08/15/2007 Document Revised: 10/16/2015 Document Reviewed: 11/20/2014 Elsevier Interactive Patient Education  2018 [place] Diet for Pancreatitis or Gallbladder Conditions A low-fat diet can be helpful if you have pancreatitis or a gallbladder condition. With these conditions, your pancreas and gallbladder have trouble digesting fats. A healthy eating plan with less fat will help rest your pancreas and gallbladder and reduce your symptoms. What do I need to know about  this diet?  Eat a low-fat diet. ? Reduce your fat intake to less than 20-30% of your total daily calories. This is less than 50-60 g of fat per day. ? Remember that you need some fat in your diet. Ask your dietician what your daily goal should be. ? Choose nonfat and low-fat healthy foods. Look for the words "nonfat," "low fat," or "fat free." ? As a guide, look on the label and choose foods with less than 3 g of fat per serving. Eat only one serving.  Avoid alcohol.  Do not smoke. If you need help quitting, talk with your health care provider.  Eat small frequent meals instead of three large heavy meals. What foods can I eat? Grains Include healthy grains and starches such as potatoes, wheat bread, fiber-rich cereal, and brown rice. Choose whole grain options whenever possible. In adults, whole grains should account for 45-65% of your daily calories. Fruits and Vegetables Eat plenty of fruits and vegetables. Fresh fruits and vegetables add fiber to your diet. Meats and Other Protein Sources Eat lean meat such as chicken and pork. Trim any fat off of meat before cooking it. Eggs, fish, and beans are other sources of protein. In adults, these foods should account for 10-35% of your daily calories. Dairy Choose low-fat milk and dairy options. Dairy includes fat and protein, as well as calcium. Fats and Oils Limit high-fat foods such as fried foods, sweets, baked goods, sugary drinks. Other Creamy sauces and condiments, such as mayonnaise, can add extra fat. Think about whether or not you need to use them, or use smaller amounts or low fat options. What foods are not recommended?  High fat foods, such as: ? [employer]. ? Ice cream. ? [place] toast. ? Sweet rolls. ? Pizza. ? Cheese bread. ? Foods covered with batter, butter, creamy sauces, or cheese. ? Fried foods. ? Sugary drinks and desserts.  Foods that cause gas or bloating This information is not intended to replace advice  given to you by your health care provider. Make sure you discuss any questions you have with your health care provider. Document Released: 02/21/2013 Document Revised: 07/25/2015 Document Reviewed: 01/30/2013 Elsevier Interactive Patient Education  2017 [place] Diet A bland diet consists of foods that do not have a lot of fat or fiber. Foods without fat or fiber are easier for the body to digest. They are also less likely  to irritate your mouth, throat, stomach, and other parts of your gastrointestinal tract. A bland diet is sometimes called a BRAT diet. What is my plan? Your health care provider or dietitian may recommend specific changes to your diet to prevent and treat your symptoms, such as:  Eating small meals often.  Cooking food until it is soft enough to chew easily.  Chewing your food well.  Drinking fluids slowly.  Not eating foods that are very spicy, sour, or fatty.  Not eating citrus fruits, such as oranges and grapefruit.  What do I need to know about this diet?  Eat a variety of foods from the bland diet food list.  Do not follow a bland diet longer than you have to.  Ask your health care provider whether you should take vitamins. What foods can I eat? Grains  Hot cereals, such as cream of wheat. Bread, crackers, or tortillas made from refined white flour. Rice. Vegetables Canned or cooked vegetables. Mashed or boiled potatoes. Fruits Bananas. Applesauce. Other types of cooked or canned fruit with the skin and seeds removed, such as canned peaches or pears. Meats and Other Protein Sources Scrambled eggs. Creamy peanut butter or other nut butters. Lean, well-cooked meats, such as chicken or fish. Tofu. Soups or broths. Dairy Low-fat dairy products, such as milk, cottage cheese, or yogurt. Beverages Water. Herbal tea. Apple juice. Sweets and Desserts Pudding. Custard. Fruit gelatin. Ice cream. Fats and Oils Mild salad dressings. Canola or olive  oil. The items listed above may not be a complete list of allowed foods or beverages. Contact your dietitian for more options. What foods are not recommended? Foods and ingredients that are often not recommended include:  Spicy foods, such as hot sauce or salsa.  Fried foods.  Sour foods, such as pickled or fermented foods.  Raw vegetables or fruits, especially citrus or berries.  Caffeinated drinks.  Alcohol.  Strongly flavored seasonings or condiments.  The items listed above may not be a complete list of foods and beverages that are not allowed. Contact your dietitian for more information. This information is not intended to replace advice given to you by your health care provider. Make sure you discuss any questions you have with your health care provider. Document Released: 06/10/2015 Document Revised: 07/25/2015 Document Reviewed: 02/28/2014 Elsevier Interactive Patient Education  2018 [employer].

## 2017-05-26 NOTE — Assessment & Plan Note (Signed)
Controlled, stable Continue current dose of medication  

## 2017-05-28 ENCOUNTER — Ambulatory Visit: Payer: Medicare Other | Admitting: Internal Medicine

## 2017-06-14 ENCOUNTER — Other Ambulatory Visit: Payer: Self-pay | Admitting: Internal Medicine

## 2017-06-17 DIAGNOSIS — K439 Ventral hernia without obstruction or gangrene: Secondary | ICD-10-CM | POA: Diagnosis not present

## 2017-06-23 DIAGNOSIS — H2512 Age-related nuclear cataract, left eye: Secondary | ICD-10-CM | POA: Diagnosis not present

## 2017-06-23 DIAGNOSIS — H4051X4 Glaucoma secondary to other eye disorders, right eye, indeterminate stage: Secondary | ICD-10-CM | POA: Diagnosis not present

## 2017-07-22 DIAGNOSIS — K439 Ventral hernia without obstruction or gangrene: Secondary | ICD-10-CM | POA: Diagnosis not present

## 2017-07-22 DIAGNOSIS — Z8551 Personal history of malignant neoplasm of bladder: Secondary | ICD-10-CM | POA: Diagnosis not present

## 2017-07-22 DIAGNOSIS — Z9079 Acquired absence of other genital organ(s): Secondary | ICD-10-CM | POA: Diagnosis not present

## 2017-07-22 DIAGNOSIS — Z8546 Personal history of malignant neoplasm of prostate: Secondary | ICD-10-CM | POA: Diagnosis not present

## 2017-07-22 DIAGNOSIS — C678 Malignant neoplasm of overlapping sites of bladder: Secondary | ICD-10-CM | POA: Diagnosis not present

## 2017-07-22 DIAGNOSIS — C61 Malignant neoplasm of prostate: Secondary | ICD-10-CM | POA: Diagnosis not present

## 2017-07-22 DIAGNOSIS — Z08 Encounter for follow-up examination after completed treatment for malignant neoplasm: Secondary | ICD-10-CM | POA: Diagnosis not present

## 2017-08-31 DIAGNOSIS — K432 Incisional hernia without obstruction or gangrene: Secondary | ICD-10-CM | POA: Diagnosis not present

## 2017-09-27 ENCOUNTER — Other Ambulatory Visit: Payer: Self-pay | Admitting: Internal Medicine

## 2017-09-29 ENCOUNTER — Ambulatory Visit: Payer: Medicare Other | Admitting: Internal Medicine

## 2017-09-29 NOTE — Progress Notes (Deleted)
Subjective:    Patient ID: Adrian Rios, male    DOB: 04-05-1940, 77 y.o.   MRN: [id number]  HPI The patient is here for follow up.  Abdominal wall hernia, related to prior surgery:    Medications and allergies reviewed with patient and updated if appropriate.  Patient Active Problem List   Diagnosis Date Noted  . Abdominal wall hernia 05/26/2017  . Recurrent urinary tract infection 11/13/2016  . Sepsis ([place]) 11/13/2016  . Chronic diastolic CHF (congestive heart failure) ([place]) 11/12/2016  . History of bladder cancer 06/18/2016  . History of prostate cancer 06/18/2016  . 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 Carotid bruit 02/17/2016  . Prediabetes 08/20/2015  . Overweight (BMI 25.0-29.9) 08/20/2015  . Esophageal reflux 08/20/2015  . Glaucoma 02/14/2015  . Varicose veins of lower extremities with other complications 33/82/5053  . RAD (reactive airway disease) 10/27/2011  . 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 ERECTILE DYSFUNCTION 04/23/2010  . [place] DISEASE, LUMBOSACRAL SPINE 02/06/2010  . 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 Depression 04/12/2008  . Allergic rhinitis 04/12/2008  . Hyperlipidemia 10/05/2006  . Sarcoidosis ([place]) 07/05/2006    Current Outpatient Medications on File Prior to Visit  Medication Sig Dispense Refill  . Fluticasone-Salmeterol (ADVAIR) 250-50 MCG/DOSE AEPB INHALE 1 DOSE BY MOUTH EVERY 12 HOURS 60 each 1  . magnesium oxide (MAG-OX) 400 MG tablet Take 400 mg by mouth daily.    . methenamine (MANDELAMINE) 1 g tablet Take 1 tablet (1,000 mg total) by mouth 2 (two) times daily. 60 tablet 0  . Misc Natural Products (GLUCOSAMINE CHOND DOUBLE STR PO) Take 1 tablet by mouth 2 (two) times daily.    . montelukast (SINGULAIR) 10 MG tablet TAKE ONE TABLET BY MOUTH ONCE DAILY 90 tablet 3  . ranitidine (ZANTAC) 150 MG tablet TAKE 1 TABLET BY MOUTH TWICE DAILY 180 tablet 1  . triamcinolone  (NASACORT ALLERGY 24HR) 55 MCG/ACT AERO nasal inhaler Place 1 spray into the nose at bedtime.    Marland Kitchen venlafaxine (EFFEXOR) 75 MG tablet TAKE 1 TABLET BY MOUTH ONCE DAILY 90 tablet 1   No current facility-administered medications on file prior to visit.     Past Medical History:  Diagnosis Date  . Allergic rhinitis   . Arthritis   . Benign localized prostatic hyperplasia with lower urinary tract symptoms (LUTS)   . Bladder tumor   . Borderline glaucoma of right eye   . Cancer [facility])    bladder and prostate  . Carotid bruit    per duplex 03-11-2016 RICA 1-39%  . Chronic throat clearing   . DDD (degenerative disc disease), lumbar   . Depression   . ED (erectile dysfunction)   . Elevated PSA    urologist-  dr [name]--- s/p  prostate bx's  . GERD (gastroesophageal reflux disease)   . Hematuria   . History of chronic bronchitis   . 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 History of squamous cell carcinoma excision    several times  . Hyperlipidemia   . Pre-diabetes   . Premature ventricular contractions (PVCs) (VPCs)   . RAD (reactive airway disease)   . 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 Sensorineural hearing loss (SNHL) of both ears   . Sepsis ([place]) 09/2016  .  UTI (urinary tract infection) 08/2016  . Wears glasses   . Wears hearing aid    bilateral    Past Surgical History:  Procedure Laterality Date  . APPENDECTOMY  2008  . CARDIOVASCULAR STRESS TEST  05/27/2010   normal nuclear study w/ no ischemia/  normal LV function and wall motion , ef 60%  . CYSTOSCOPY WITH INJECTION N/A 06/12/2016   Procedure: CYSTOSCOPY WITH INJECTION OF INDOCYANINE GREEN DYE;  Surgeon: Alexis Frock, MD;  Location: [facility];  Service: Urology;  Laterality: N/A;  . DEEP NECK LYMPH NODE BIOPSY / EXCISION  1970's   and Bronchoscopy w/ bx's ( dx Sarcoidosis)  . ILEOSTOMY    . IR  NEPHROSTOMY PLACEMENT LEFT  11/18/2016  . MOHS SURGERY  2013 approx.    nasal; pre melanoma  . PARS PLANA VITRECTOMY Right 2007   repair macular pucker  . TONSILLECTOMY AND ADENOIDECTOMY  child  . TRANSURETHRAL RESECTION OF BLADDER TUMOR N/A 04/06/2016   Procedure: CYSTOSCOPY TRANSURETHRAL RESECTION OF BLADDER TUMORS (TURBT);  Surgeon: Carolan Clines, MD;  Location: [facility];  Service: Urology;  Laterality: N/A;  . TRANSURETHRAL RESECTION OF PROSTATE      Social History   Socioeconomic History  . Marital status: Married    Spouse name: Not on file  . Number of children: Not on file  . Years of education: Not on file  . Highest education level: Not on file  Occupational History  . Not on file  Social Needs  . Financial resource strain: Not hard at all  . Food insecurity:    Worry: Never true    Inability: Never true  . Transportation needs:    Medical: No    Non-medical: No  Tobacco Use  . Smoking status: Never Smoker  . Smokeless tobacco: Never Used  Substance and Sexual Activity  . Alcohol use: No  . Drug use: No  . Sexual activity: Not Currently  Lifestyle  . Physical activity:    Days per week: 4 days    Minutes per session: 40 min  . Stress: Only a little  Relationships  . Social connections:    Talks on phone: More than three times a week    Gets together: More than three times a week    Attends religious service: More than 4 times per year    Active member of club or organization: Not on file    Attends meetings of clubs or organizations: More than 4 times per year    Relationship status: Married  Other Topics Concern  . Not on file  Social History Narrative  . Not on file    Family History  Problem Relation Age of Onset  . Stroke Mother        in her 55s  . Breast cancer Sister   . Heart disease Maternal Grandmother 84       MI   . Breast cancer Paternal Grandmother   . Heart disease Paternal Grandfather 23       MI  . Stroke  Brother   . Diabetes Neg Hx     Review of Systems     Objective:  There were no vitals filed for this visit. BP Readings from Last 3 Encounters:  05/26/17 112/64  05/26/17 112/64  02/04/17 114/71   Wt Readings from Last 3 Encounters:  05/26/17 168 lb (76.2 kg)  05/26/17 168 lb (76.2 kg)  02/04/17 157 lb (71.2 kg)   There is no height or  weight on file to calculate BMI.   Physical Exam    Constitutional: Appears well-developed and well-nourished. No distress.  HENT:  Head: Normocephalic and atraumatic.  Neck: Neck supple. No tracheal deviation present. No thyromegaly present.  No cervical lymphadenopathy Cardiovascular: Normal rate, regular rhythm and normal heart sounds.   No murmur heard. No carotid bruit .  No edema Pulmonary/Chest: Effort normal and breath sounds normal. No respiratory distress. No has no wheezes. No rales.  Skin: Skin is warm and dry. Not diaphoretic.  Psychiatric: Normal mood and affect. Behavior is normal.      Assessment & Plan:    See Problem List for Assessment and Plan of chronic medical problems.

## 2017-09-30 ENCOUNTER — Other Ambulatory Visit: Payer: Self-pay | Admitting: Internal Medicine

## 2017-09-30 NOTE — Progress Notes (Signed)
Subjective:    Patient ID: Adrian Rios, male    DOB: 10/18/1940, 77 y.o.   MRN: [id number]  HPI The patient is here for follow up.  Abdominal wall hernia, related to prior surgery: He is a large left lower quadrant abdominal hernia secondary to prior surgeries.  It has gotten larger over the past year.  He denies any pain, but does have some discomfort when he bends over.  He did see surgery 1 month ago at [facility] to discuss surgery.  She said it was an elective surgery.  She said more likely than not it will need to be done at some point.  He is concerned about surgery because of the numerous antibiotics he received from previous infections, he is concerned about undergoing surgery because of his prior experience as well.  He wonders if he is healthy enough to undergo surgery and when the best time would be to have surgery.  He has no concerns and overall he feels good.  His energy level is on the low side.  He is not currently exercising regularly.  He is eating fairly healthy.  He states his sleep is good.  Prediabetes:  He is compliant with a low sugar/carbohydrate diet.  He is not exercising regularly.  GERD:  He is taking his medication daily as prescribed.  He denies any GERD symptoms and feels his GERD is well controlled.    Hyperlipidemia: He is controlling his cholesterol with diet.  He does eat healthy.  He is not currently exercising on a regular basis.  Depression: He is taking his medication daily as prescribed. He denies any side effects from the medication. He feels his depression is well controlled and he is happy with his current dose of medication.       Medications and allergies reviewed with patient and updated if appropriate.  Patient Active Problem List   Diagnosis Date Noted  . Abdominal wall hernia 05/26/2017  . Recurrent urinary tract infection 11/13/2016  . Sepsis ([place]) 11/13/2016  . Chronic diastolic CHF (congestive heart failure) ([place]) 11/12/2016  .  History of bladder cancer 06/18/2016  . History of prostate cancer 06/18/2016  . 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 Carotid bruit 02/17/2016  . Prediabetes 08/20/2015  . Overweight (BMI 25.0-29.9) 08/20/2015  . Esophageal reflux 08/20/2015  . Glaucoma 02/14/2015  . Varicose veins of lower extremities with other complications 87/86/7672  . RAD (reactive airway disease) 10/27/2011  . 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 ERECTILE DYSFUNCTION 04/23/2010  . [place] DISEASE, LUMBOSACRAL SPINE 02/06/2010  . 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 Depression 04/12/2008  . Allergic rhinitis 04/12/2008  . Hyperlipidemia 10/05/2006  . Sarcoidosis ([place]) 07/05/2006    Current Outpatient Medications on File Prior to Visit  Medication Sig Dispense Refill  . Fluticasone-Salmeterol (ADVAIR) 250-50 MCG/DOSE AEPB INHALE 1 DOSE BY MOUTH EVERY 12 HOURS 60 each 1  . magnesium oxide (MAG-OX) 400 MG tablet Take 400 mg by mouth daily.    . Misc Natural Products (GLUCOSAMINE CHOND DOUBLE STR PO) Take 1 tablet by mouth 2 (two) times daily.    . montelukast (SINGULAIR) 10 MG tablet TAKE ONE TABLET BY MOUTH ONCE DAILY 90 tablet 3  . ranitidine (ZANTAC) 150 MG tablet TAKE 1 TABLET BY MOUTH TWICE DAILY 180 tablet 1  . triamcinolone (NASACORT ALLERGY 24HR) 55 MCG/ACT AERO nasal inhaler Place 1 spray into the nose at bedtime.    Marland Kitchen venlafaxine (  EFFEXOR) 75 MG tablet TAKE ONE TABLET BY MOUTH DAILY 90 tablet 1   No current facility-administered medications on file prior to visit.     Past Medical History:  Diagnosis Date  . Allergic rhinitis   . Arthritis   . Benign localized prostatic hyperplasia with lower urinary tract symptoms (LUTS)   . Bladder tumor   . Borderline glaucoma of right eye   . Cancer [facility])    bladder and prostate  . Carotid bruit    per duplex 03-11-2016 RICA 1-39%  . Chronic throat clearing   . DDD (degenerative disc disease),  lumbar   . Depression   . ED (erectile dysfunction)   . Elevated PSA    urologist-  dr [name]--- s/p  prostate bx's  . GERD (gastroesophageal reflux disease)   . Hematuria   . History of chronic bronchitis   . 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 History of squamous cell carcinoma excision    several times  . Hyperlipidemia   . Pre-diabetes   . Premature ventricular contractions (PVCs) ([facility])   . RAD (reactive airway disease)   . 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 Sensorineural hearing loss (SNHL) of both ears   . Sepsis ([place]) 09/2016  . UTI (urinary tract infection) 08/2016  . Wears glasses   . Wears hearing aid    bilateral    Past Surgical History:  Procedure Laterality Date  . APPENDECTOMY  2008  . CARDIOVASCULAR STRESS TEST  05/27/2010   normal nuclear study w/ no ischemia/  normal LV function and wall motion , ef 60%  . CYSTOSCOPY WITH INJECTION N/A 06/12/2016   Procedure: CYSTOSCOPY WITH INJECTION OF INDOCYANINE GREEN DYE;  Surgeon: Alexis Frock, MD;  Location: [facility];  Service: Urology;  Laterality: N/A;  . DEEP NECK LYMPH NODE BIOPSY / EXCISION  1970's   and Bronchoscopy w/ bx's ( dx Sarcoidosis)  . ILEOSTOMY    . IR NEPHROSTOMY PLACEMENT LEFT  11/18/2016  . MOHS SURGERY  2013 approx.    nasal; pre melanoma  . PARS PLANA VITRECTOMY Right 2007   repair macular pucker  . TONSILLECTOMY AND ADENOIDECTOMY  child  . TRANSURETHRAL RESECTION OF BLADDER TUMOR N/A 04/06/2016   Procedure: CYSTOSCOPY TRANSURETHRAL RESECTION OF BLADDER TUMORS (TURBT);  Surgeon: Carolan Clines, MD;  Location: [facility];  Service: Urology;  Laterality: N/A;  . TRANSURETHRAL RESECTION OF PROSTATE      Social History   Socioeconomic History  . Marital status: Married    Spouse name: Not on file  . Number of children: Not on file  . Years of education:  Not on file  . Highest education level: Not on file  Occupational History  . Not on file  Social Needs  . Financial resource strain: Not hard at all  . Food insecurity:    Worry: Never true    Inability: Never true  . Transportation needs:    Medical: No    Non-medical: No  Tobacco Use  . Smoking status: Never Smoker  . Smokeless tobacco: Never Used  Substance and Sexual Activity  . Alcohol use: No  . Drug use: No  . Sexual activity: Not Currently  Lifestyle  . Physical activity:    Days per week: 4 days    Minutes per session: 40 min  .  Stress: Only a little  Relationships  . Social connections:    Talks on phone: More than three times a week    Gets together: More than three times a week    Attends religious service: More than 4 times per year    Active member of club or organization: Not on file    Attends meetings of clubs or organizations: More than 4 times per year    Relationship status: Married  Other Topics Concern  . Not on file  Social History Narrative  . Not on file    Family History  Problem Relation Age of Onset  . Stroke Mother        in her 38s  . Breast cancer Sister   . Heart disease Maternal Grandmother 84       MI   . Breast cancer Paternal Grandmother   . Heart disease Paternal Grandfather 61       MI  . Stroke Brother   . Diabetes Neg Hx     Review of Systems  Constitutional: Negative for appetite change, chills and fever.  Respiratory: Positive for shortness of breath (stairs only). Negative for cough and wheezing.   Cardiovascular: Negative for chest pain, palpitations and leg swelling.  Psychiatric/Behavioral: Negative for sleep disturbance.       Objective:   Vitals:   10/01/17 1002  BP: 120/80  Pulse: 93  SpO2: 95%   BP Readings from Last 3 Encounters:  10/01/17 120/80  05/26/17 112/64  05/26/17 112/64   Wt Readings from Last 3 Encounters:  10/01/17 175 lb (79.4 kg)  05/26/17 168 lb (76.2 kg)  05/26/17 168 lb  (76.2 kg)   Body mass index is 27.41 kg/m.   Physical Exam    Constitutional: Appears well-developed and well-nourished. No distress.  HENT:  Head: Normocephalic and atraumatic.  Neck: Neck supple. No tracheal deviation present. No thyromegaly present.  No cervical lymphadenopathy Cardiovascular: Normal rate, regular rhythm and normal heart sounds.   No murmur heard. No carotid bruit .  No edema Pulmonary/Chest: Effort normal and breath sounds normal. No respiratory distress. No has no wheezes. No rales. Abdomen: Soft, nontender, left lower quadrant large abdominal hernia that is non tender Skin: Skin is warm and dry. Not diaphoretic.  Psychiatric: Normal mood and affect. Behavior is normal.      Assessment & Plan:    See Problem List for Assessment and Plan of chronic medical problems.

## 2017-10-01 ENCOUNTER — Other Ambulatory Visit (INDEPENDENT_AMBULATORY_CARE_PROVIDER_SITE_OTHER): Payer: Medicare Other

## 2017-10-01 ENCOUNTER — Encounter: Payer: Self-pay | Admitting: Internal Medicine

## 2017-10-01 ENCOUNTER — Ambulatory Visit (INDEPENDENT_AMBULATORY_CARE_PROVIDER_SITE_OTHER): Payer: Medicare Other | Admitting: Internal Medicine

## 2017-10-01 VITALS — BP 120/80 | HR 93 | Ht 67.0 in | Wt 175.0 lb

## 2017-10-01 DIAGNOSIS — K219 Gastro-esophageal reflux disease without esophagitis: Secondary | ICD-10-CM | POA: Diagnosis not present

## 2017-10-01 DIAGNOSIS — R7303 Prediabetes: Secondary | ICD-10-CM

## 2017-10-01 DIAGNOSIS — R5383 Other fatigue: Secondary | ICD-10-CM

## 2017-10-01 DIAGNOSIS — E785 Hyperlipidemia, unspecified: Secondary | ICD-10-CM

## 2017-10-01 DIAGNOSIS — F329 Major depressive disorder, single episode, unspecified: Secondary | ICD-10-CM | POA: Diagnosis not present

## 2017-10-01 DIAGNOSIS — K439 Ventral hernia without obstruction or gangrene: Secondary | ICD-10-CM | POA: Diagnosis not present

## 2017-10-01 DIAGNOSIS — F32A Depression, unspecified: Secondary | ICD-10-CM

## 2017-10-01 LAB — COMPREHENSIVE METABOLIC PANEL
ALK PHOS: 95 U/L (ref 39–117)
ALT: 13 U/L (ref 0–53)
AST: 11 U/L (ref 0–37)
Albumin: 4 g/dL (ref 3.5–5.2)
BUN: 17 mg/dL (ref 6–23)
CO2: 24 mEq/L (ref 19–32)
Calcium: 9.7 mg/dL (ref 8.4–10.5)
Chloride: 106 mEq/L (ref 96–112)
Creatinine, Ser: 1.2 mg/dL (ref 0.40–1.50)
GFR: 62.36 mL/min (ref >60.00)
Glucose, Bld: 107 mg/dL — ABNORMAL HIGH (ref 70–99)
POTASSIUM: 4.1 meq/L (ref 3.5–5.1)
SODIUM: 139 meq/L (ref 135–145)
TOTAL PROTEIN: 7.5 g/dL (ref 6.0–8.3)
Total Bilirubin: 0.4 mg/dL (ref 0.2–1.2)

## 2017-10-01 LAB — CBC WITH DIFFERENTIAL/PLATELET
BASOS ABS: 0.1 10*3/uL (ref 0.0–0.1)
Basophils Relative: 0.9 % (ref 0.0–3.0)
EOS ABS: 0.2 10*3/uL (ref 0.0–0.7)
Eosinophils Relative: 2.1 % (ref 0.0–5.0)
HCT: 41.9 % (ref 39.0–52.0)
Hemoglobin: 14.4 g/dL (ref 13.0–17.0)
Lymphocytes Relative: 21.4 % (ref 12.0–46.0)
Lymphs Abs: 2.2 10*3/uL (ref 0.7–4.0)
MCHC: 34.2 g/dL (ref 30.0–36.0)
MCV: 89.2 fl (ref 78.0–100.0)
MONO ABS: 0.9 10*3/uL (ref 0.1–1.0)
Monocytes Relative: 8.6 % (ref 3.0–12.0)
NEUTROS ABS: 6.9 10*3/uL (ref 1.4–7.7)
Neutrophils Relative %: 67 % (ref 43.0–77.0)
PLATELETS: 371 10*3/uL (ref 150.0–400.0)
RBC: 4.7 Mil/uL (ref 4.22–5.81)
RDW: 13.5 % (ref 11.5–15.5)
WBC: 10.2 10*3/uL (ref 4.0–10.5)

## 2017-10-01 LAB — TSH: TSH: 1.54 u[IU]/mL (ref 0.35–4.50)

## 2017-10-01 LAB — HEMOGLOBIN A1C: HEMOGLOBIN A1C: 5.9 % (ref 4.6–6.5)

## 2017-10-01 NOTE — Patient Instructions (Addendum)
  Test(s) ordered today. Your results will be released to [place] (or called to you) after review, usually within 72hours after test completion. If any changes need to be made, you will be notified at that same time.    Medications reviewed and updated.  No changes recommended at this time.   Please followup once a year

## 2017-10-01 NOTE — Assessment & Plan Note (Signed)
GERD controlled Continue daily medication  

## 2017-10-01 NOTE — Assessment & Plan Note (Signed)
Regular exercise and healthy diet encouraged

## 2017-10-01 NOTE — Assessment & Plan Note (Signed)
Check a1c Low sugar / carb diet Stressed regular exercise   

## 2017-10-01 NOTE — Assessment & Plan Note (Signed)
Cbc, cmp, tsh Stressed regular exercise

## 2017-10-01 NOTE — Assessment & Plan Note (Signed)
Will likely have surgery in the next several months Discussed surgery - I do think he is healthier enough to undergo surgery - no concerning symptoms

## 2017-10-01 NOTE — Assessment & Plan Note (Signed)
Controlled, stable Continue current dose of medication  

## 2017-10-02 ENCOUNTER — Encounter: Payer: Self-pay | Admitting: Internal Medicine

## 2017-10-18 ENCOUNTER — Other Ambulatory Visit: Payer: Self-pay | Admitting: Internal Medicine

## 2017-11-11 ENCOUNTER — Encounter: Payer: Self-pay | Admitting: Internal Medicine

## 2017-11-11 ENCOUNTER — Ambulatory Visit (INDEPENDENT_AMBULATORY_CARE_PROVIDER_SITE_OTHER)
Admission: RE | Admit: 2017-11-11 | Discharge: 2017-11-11 | Disposition: A | Discharge status: Home / Self Care | Payer: Medicare Other | Source: Ambulatory Visit | Attending: Internal Medicine | Admitting: Internal Medicine

## 2017-11-11 ENCOUNTER — Ambulatory Visit (INDEPENDENT_AMBULATORY_CARE_PROVIDER_SITE_OTHER): Payer: Medicare Other | Admitting: Internal Medicine

## 2017-11-11 VITALS — BP 102/64 | HR 83 | Temp 98.0°F | Ht 67.0 in | Wt 175.0 lb

## 2017-11-11 DIAGNOSIS — R7303 Prediabetes: Secondary | ICD-10-CM

## 2017-11-11 DIAGNOSIS — M160 Bilateral primary osteoarthritis of hip: Secondary | ICD-10-CM | POA: Diagnosis not present

## 2017-11-11 DIAGNOSIS — M25552 Pain in left hip: Secondary | ICD-10-CM

## 2017-11-11 DIAGNOSIS — Z23 Encounter for immunization: Secondary | ICD-10-CM | POA: Diagnosis not present

## 2017-11-11 DIAGNOSIS — K439 Ventral hernia without obstruction or gangrene: Secondary | ICD-10-CM | POA: Diagnosis not present

## 2017-11-11 MED ORDER — TIZANIDINE HCL 2 MG PO TABS: 2.0000 mg | ORAL_TABLET | Freq: Four times a day (QID) | ORAL | 1 refills | Status: DC | PRN

## 2017-11-11 NOTE — Progress Notes (Signed)
Subjective:    Patient ID: Adrian Rios, male    DOB: Feb 20, 1941, 77 y.o.   MRN: [id number]  HPI  Here with c/o acute onset sharp moderate to severe pain constant to mostly left lateral hip with some radiation to the left groin but more to the left anterior thigh.  Occurred last PM with just trying to stand up to go to bed.  Not standing quickly or other unsuual mistep, falls or other trauma.  No hx of hip DJD or other significant msk abnormal.  Is s/p cystoprostatectomy with a resultant LLQ hernia that will need repair but not painful now.  Has hx of recurring mild LBP with right sciatica like pain but none in the past to the left and this not similar as has no LBP or buttock pain.  Limps to walk to the bed last PM.  Woke him up several times last night, and 4 ibuprofen - some improved but still painful.  No really otherwise better or worse with anything.  No hx of MSk strain, meralgia, AVN, pagets or hip DJD.   Past Medical History:  Diagnosis Date  . Allergic rhinitis   . Arthritis   . Benign localized prostatic hyperplasia with lower urinary tract symptoms (LUTS)   . Bladder tumor   . Borderline glaucoma of right eye   . Cancer [facility])    bladder and prostate  . Carotid bruit    per duplex 03-11-2016 RICA 1-39%  . Chronic throat clearing   . DDD (degenerative disc disease), lumbar   . Depression   . ED (erectile dysfunction)   . Elevated PSA    urologist-  dr [name]--- s/p  prostate bx's  . GERD (gastroesophageal reflux disease)   . Hematuria   . History of chronic bronchitis   . 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 History of squamous cell carcinoma excision    several times  . Hyperlipidemia   . Pre-diabetes   . Premature ventricular contractions (PVCs) ([facility])   . RAD (reactive airway disease)   . 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 Sensorineural hearing  loss (SNHL) of both ears   . Sepsis ([place]) 09/2016  . UTI (urinary tract infection) 08/2016  . Wears glasses   . Wears hearing aid    bilateral   Past Surgical History:  Procedure Laterality Date  . APPENDECTOMY  2008  . CARDIOVASCULAR STRESS TEST  05/27/2010   normal nuclear study w/ no ischemia/  normal LV function and wall motion , ef 60%  . CYSTOSCOPY WITH INJECTION N/A 06/12/2016   Procedure: CYSTOSCOPY WITH INJECTION OF INDOCYANINE GREEN DYE;  Surgeon: Alexis Frock, MD;  [facility];  Service: Urology;  Laterality: N/A;  . DEEP NECK LYMPH NODE BIOPSY / EXCISION  1970's   and Bronchoscopy w/ bx's ( dx Sarcoidosis)  . ILEOSTOMY    . IR NEPHROSTOMY PLACEMENT LEFT  11/18/2016  . MOHS SURGERY  2013 approx.    nasal; pre melanoma  . PARS PLANA VITRECTOMY Right 2007   repair macular pucker  . TONSILLECTOMY AND ADENOIDECTOMY  child  . TRANSURETHRAL RESECTION OF BLADDER TUMOR N/A 04/06/2016   Procedure: CYSTOSCOPY TRANSURETHRAL RESECTION OF BLADDER TUMORS (TURBT);  Surgeon: Carolan Clines, MD;  Location: [facility];  Service: Urology;  Laterality: N/A;  . TRANSURETHRAL  RESECTION OF PROSTATE      reports that he has never smoked. He has never used smokeless tobacco. He reports that he does not drink alcohol or use drugs. family history includes Breast cancer in his paternal grandmother and sister; Heart disease (age of onset: 19) in his maternal grandmother; Heart disease (age of onset: 62) in his paternal grandfather; Stroke in his brother and mother. Allergies  Allergen Reactions  . Ivp Dye [Iodinated Diagnostic Agents] Shortness Of Breath, Itching and Palpitations    "eyes itching,  Heart racing,  Effected breathing" 10-27-16 pt with 13 hr pre-meds for CT without any reaction-kj  . Shellfish Allergy Hives, Shortness Of Breath and Itching    Mostly crab  . Gabapentin Other (See Comments)    REACTION: dizziness and flushing  . Niacin-Lovastatin Er Other (See  Comments)    Did not feel good on medication  . Adhesive [Tape] Rash    Use paper tape   Current Outpatient Medications on File Prior to Visit  Medication Sig Dispense Refill  . ADVAIR DISKUS 250-50 MCG/DOSE AEPB INHALE 1 DOSE BY MOUTH EVERY 12 HOURS 60 each 1  . magnesium oxide (MAG-OX) 400 MG tablet Take 400 mg by mouth daily.    . Misc Natural Products (GLUCOSAMINE CHOND DOUBLE STR PO) Take 1 tablet by mouth 2 (two) times daily.    . montelukast (SINGULAIR) 10 MG tablet TAKE ONE TABLET BY MOUTH ONCE DAILY 90 tablet 3  . ranitidine (ZANTAC) 150 MG tablet TAKE 1 TABLET BY MOUTH TWICE DAILY 180 tablet 1  . triamcinolone (NASACORT ALLERGY 24HR) 55 MCG/ACT AERO nasal inhaler Place 1 spray into the nose at bedtime.    Marland Kitchen venlafaxine (EFFEXOR) 75 MG tablet TAKE ONE TABLET BY MOUTH DAILY 90 tablet 1   No current facility-administered medications on file prior to visit.    Review of Systems  Constitutional: Negative for other unusual diaphoresis or sweats HENT: Negative for ear discharge or swelling Eyes: Negative for other worsening visual disturbances Respiratory: Negative for stridor or other swelling  Gastrointestinal: Negative for worsening distension or other blood Genitourinary: Negative for retention or other urinary change Musculoskeletal: Negative for other MSK pain or swelling Skin: Negative for color change or other new lesions Neurological: Negative for worsening tremors and other numbness  Psychiatric/Behavioral: Negative for worsening agitation or other fatigue All other system neg per pt    Objective:   Physical Exam BP 102/64   Pulse 83   Temp 98 F (36.7 C) (Oral)   Ht 5\' 7"  (1.702 m)   Wt 175 lb (79.4 kg)   SpO2 96%   BMI 27.41 kg/m  VS noted, not ill appearing Constitutional: Pt appears in NAD HENT: Head: NCAT.  Right Ear: External ear normal.  Left Ear: External ear normal.  Eyes: . Pupils are equal, round, and reactive to light. Conjunctivae and EOM are  normal Nose: without d/c or deformity Neck: Neck supple. Gross normal ROM Cardiovascular: Normal rate and regular rhythm.   Pulmonary/Chest: Effort normal and breath sounds without rales or wheezing.  Abd:  Soft, NT, ND, + BS, no organomegaly Left lateral hip area with point tenderness about the lateral inguinal ligament insertion area, no swelling, skin change or mass; has some increased pain but other FROM to left hip internal rotation (none with lateral rotation or flexion which are full) Neurological: Pt is alert. At baseline orientation, motor grossly intact Skin: Skin is warm. No rashes, other new lesions, no LE edema Psychiatric: Pt  behavior is normal without agitation  No other exam findings Lab Results  Component Value Date   WBC 10.2 10/01/2017   HGB 14.4 10/01/2017   HCT 41.9 10/01/2017   PLT 371.0 10/01/2017   GLUCOSE 107 (H) 10/01/2017   CHOL 168 02/17/2016   TRIG 138.0 02/17/2016   HDL 44.80 02/17/2016   LDLCALC 96 02/17/2016   ALT 13 10/01/2017   AST 11 10/01/2017   NA 139 10/01/2017   K 4.1 10/01/2017   CL 106 10/01/2017   CREATININE 1.20 10/01/2017   BUN 17 10/01/2017   CO2 24 10/01/2017   TSH 1.54 10/01/2017   PSA 0.29 07/11/2009   INR 0.99 10/25/2016   HGBA1C 5.9 10/01/2017   MICROALBUR 0.3 06/29/2006       Assessment & Plan:

## 2017-11-11 NOTE — Assessment & Plan Note (Signed)
?   MSK strain but pt denies; exam with some point tenderness to the area and exam o/w benign - for xray, tizanidine 2 mg prn, and consider sports medicine if not improved in 3-5 days

## 2017-11-11 NOTE — Assessment & Plan Note (Signed)
stable overall by history and exam, recent data reviewed with pt, and pt to continue medical treatment as before,  to f/u any worsening symptoms or concerns Lab Results  Component Value Date   HGBA1C 5.9 10/01/2017

## 2017-11-11 NOTE — Patient Instructions (Signed)
Please take all new medication as prescribed - the muscle relaxer as needed  Please continue all other medications as before, and refills have been done if requested.  Please have the pharmacy call with any other refills you may need.  Please keep your appointments with your specialists as you may have planned  Please go to the XRAY Department in the Basement (go straight as you get off the elevator) for the x-ray testing  You will be contacted by phone if any changes need to be made immediately.  Otherwise, you will receive a letter about your results with an explanation, but please check with MyChart first.  Please remember to sign up for MyChart if you have not done so, as this will be important to you in the future with finding out test results, communicating by private email, and scheduling acute appointments online when needed.  Please consider seeing Sports Medicine in this office if you are not improved in 3-5 days

## 2017-11-11 NOTE — Assessment & Plan Note (Signed)
Asympt, exam ok, ok to follow

## 2017-11-12 ENCOUNTER — Other Ambulatory Visit: Payer: Self-pay | Admitting: Internal Medicine

## 2017-11-12 ENCOUNTER — Ambulatory Visit: Payer: Medicare Other | Admitting: Internal Medicine

## 2017-11-19 DIAGNOSIS — D225 Melanocytic nevi of trunk: Secondary | ICD-10-CM | POA: Diagnosis not present

## 2017-11-19 DIAGNOSIS — D2271 Melanocytic nevi of right lower limb, including hip: Secondary | ICD-10-CM | POA: Diagnosis not present

## 2017-11-19 DIAGNOSIS — L570 Actinic keratosis: Secondary | ICD-10-CM | POA: Diagnosis not present

## 2017-11-19 DIAGNOSIS — D2261 Melanocytic nevi of right upper limb, including shoulder: Secondary | ICD-10-CM | POA: Diagnosis not present

## 2017-11-19 DIAGNOSIS — L821 Other seborrheic keratosis: Secondary | ICD-10-CM | POA: Diagnosis not present

## 2017-11-19 DIAGNOSIS — B351 Tinea unguium: Secondary | ICD-10-CM | POA: Diagnosis not present

## 2017-11-19 DIAGNOSIS — D2262 Melanocytic nevi of left upper limb, including shoulder: Secondary | ICD-10-CM | POA: Diagnosis not present

## 2017-11-19 DIAGNOSIS — D1801 Hemangioma of skin and subcutaneous tissue: Secondary | ICD-10-CM | POA: Diagnosis not present

## 2017-11-19 DIAGNOSIS — D2272 Melanocytic nevi of left lower limb, including hip: Secondary | ICD-10-CM | POA: Diagnosis not present

## 2017-11-26 ENCOUNTER — Ambulatory Visit: Payer: Medicare Other | Admitting: Internal Medicine

## 2017-11-29 ENCOUNTER — Telehealth: Payer: Self-pay | Admitting: Internal Medicine

## 2017-11-29 NOTE — Telephone Encounter (Signed)
Copied from [place] [id number]. Topic: Quick Communication - See Telephone Encounter >> Nov 29, 2017  9:59 AM Ahmed Prima L wrote: CRM for notification. See Telephone encounter for: 11/29/17.  Patient would like a prescription for a " colo-guard " test. Patient would like Dr Eilleen Kempf nurse to call him back in regards to this.

## 2017-11-29 NOTE — Telephone Encounter (Signed)
LVM for pt to call back in regards.  

## 2017-11-29 NOTE — Telephone Encounter (Signed)
Spoke with pt and advised he check with insurance before order is put in. Pt understood and will call back and let me know if this is covered.

## 2017-11-30 ENCOUNTER — Encounter: Payer: Self-pay | Admitting: Internal Medicine

## 2017-12-03 LAB — COLOGUARD: COLOGUARD: NEGATIVE

## 2017-12-08 DIAGNOSIS — C678 Malignant neoplasm of overlapping sites of bladder: Secondary | ICD-10-CM | POA: Diagnosis not present

## 2017-12-08 DIAGNOSIS — R7303 Prediabetes: Secondary | ICD-10-CM | POA: Diagnosis not present

## 2017-12-08 DIAGNOSIS — Z01818 Encounter for other preprocedural examination: Secondary | ICD-10-CM | POA: Diagnosis not present

## 2017-12-08 DIAGNOSIS — K219 Gastro-esophageal reflux disease without esophagitis: Secondary | ICD-10-CM | POA: Diagnosis not present

## 2017-12-08 DIAGNOSIS — K432 Incisional hernia without obstruction or gangrene: Secondary | ICD-10-CM | POA: Diagnosis not present

## 2017-12-08 DIAGNOSIS — D869 Sarcoidosis, unspecified: Secondary | ICD-10-CM | POA: Diagnosis not present

## 2017-12-08 DIAGNOSIS — E785 Hyperlipidemia, unspecified: Secondary | ICD-10-CM | POA: Diagnosis not present

## 2017-12-08 DIAGNOSIS — C61 Malignant neoplasm of prostate: Secondary | ICD-10-CM | POA: Diagnosis not present

## 2017-12-13 DIAGNOSIS — Z1211 Encounter for screening for malignant neoplasm of colon: Secondary | ICD-10-CM | POA: Diagnosis not present

## 2017-12-13 DIAGNOSIS — Z1212 Encounter for screening for malignant neoplasm of rectum: Secondary | ICD-10-CM | POA: Diagnosis not present

## 2017-12-16 ENCOUNTER — Encounter: Payer: Self-pay | Admitting: Internal Medicine

## 2017-12-24 DIAGNOSIS — K432 Incisional hernia without obstruction or gangrene: Secondary | ICD-10-CM | POA: Diagnosis present

## 2017-12-24 DIAGNOSIS — G8918 Other acute postprocedural pain: Secondary | ICD-10-CM | POA: Diagnosis not present

## 2017-12-24 DIAGNOSIS — Z8546 Personal history of malignant neoplasm of prostate: Secondary | ICD-10-CM | POA: Diagnosis not present

## 2017-12-24 DIAGNOSIS — D860 Sarcoidosis of lung: Secondary | ICD-10-CM | POA: Diagnosis present

## 2017-12-24 DIAGNOSIS — Z5331 Laparoscopic surgical procedure converted to open procedure: Secondary | ICD-10-CM | POA: Diagnosis not present

## 2017-12-24 DIAGNOSIS — K219 Gastro-esophageal reflux disease without esophagitis: Secondary | ICD-10-CM | POA: Diagnosis present

## 2017-12-24 DIAGNOSIS — Z8551 Personal history of malignant neoplasm of bladder: Secondary | ICD-10-CM | POA: Diagnosis not present

## 2017-12-24 DIAGNOSIS — N189 Chronic kidney disease, unspecified: Secondary | ICD-10-CM | POA: Diagnosis present

## 2017-12-24 DIAGNOSIS — Z936 Other artificial openings of urinary tract status: Secondary | ICD-10-CM | POA: Diagnosis not present

## 2017-12-24 DIAGNOSIS — M48061 Spinal stenosis, lumbar region without neurogenic claudication: Secondary | ICD-10-CM | POA: Diagnosis present

## 2017-12-24 DIAGNOSIS — K660 Peritoneal adhesions (postprocedural) (postinfection): Secondary | ICD-10-CM | POA: Diagnosis present

## 2017-12-24 DIAGNOSIS — Z79899 Other long term (current) drug therapy: Secondary | ICD-10-CM | POA: Diagnosis not present

## 2017-12-24 DIAGNOSIS — G8929 Other chronic pain: Secondary | ICD-10-CM | POA: Diagnosis present

## 2017-12-25 ENCOUNTER — Telehealth: Payer: Self-pay | Admitting: Internal Medicine

## 2017-12-25 NOTE — Telephone Encounter (Signed)
Let him know his cologuard test is negative.

## 2017-12-27 ENCOUNTER — Encounter: Payer: Self-pay | Admitting: Internal Medicine

## 2017-12-27 MED ORDER — ALBUTEROL SULFATE HFA 108 (90 BASE) MCG/ACT IN AERS
2.00 | INHALATION_SPRAY | RESPIRATORY_TRACT | Status: DC
Start: ? — End: 2017-12-27

## 2017-12-27 MED ORDER — FLUTICASONE PROPIONATE 50 MCG/ACT NA SUSP
1.00 | NASAL | Status: DC
Start: 2017-12-28 — End: 2017-12-27

## 2017-12-27 MED ORDER — RANITIDINE HCL 150 MG PO TABS
150.00 | ORAL_TABLET | ORAL | Status: DC
Start: 2017-12-27 — End: 2017-12-27

## 2017-12-27 MED ORDER — SENNOSIDES-DOCUSATE SODIUM 8.6-50 MG PO TABS
2.00 | ORAL_TABLET | ORAL | Status: DC
Start: 2017-12-27 — End: 2017-12-27

## 2017-12-27 MED ORDER — ONDANSETRON HCL 4 MG PO TABS
4.00 | ORAL_TABLET | ORAL | Status: DC
Start: ? — End: 2017-12-27

## 2017-12-27 MED ORDER — BUDESONIDE-FORMOTEROL FUMARATE 160-4.5 MCG/ACT IN AERO
2.00 | INHALATION_SPRAY | RESPIRATORY_TRACT | Status: DC
Start: 2017-12-27 — End: 2017-12-27

## 2017-12-27 MED ORDER — MONTELUKAST SODIUM 10 MG PO TABS
10.00 | ORAL_TABLET | ORAL | Status: DC
Start: 2017-12-27 — End: 2017-12-27

## 2017-12-27 MED ORDER — ACETAMINOPHEN 325 MG PO TABS
975.00 | ORAL_TABLET | ORAL | Status: DC
Start: 2017-12-27 — End: 2017-12-27

## 2017-12-27 MED ORDER — CELECOXIB 200 MG PO CAPS
200.00 | ORAL_CAPSULE | ORAL | Status: DC
Start: 2017-12-28 — End: 2017-12-27

## 2017-12-27 MED ORDER — VENLAFAXINE HCL 75 MG PO TABS
75.00 | ORAL_TABLET | ORAL | Status: DC
Start: 2017-12-27 — End: 2017-12-27

## 2017-12-27 MED ORDER — HEPARIN SODIUM (PORCINE) 5000 UNIT/ML IJ SOLN
5000.00 | INTRAMUSCULAR | Status: DC
Start: 2017-12-27 — End: 2017-12-27

## 2017-12-27 MED ORDER — HYDROMORPHONE HCL 2 MG PO TABS
2.00 | ORAL_TABLET | ORAL | Status: DC
Start: ? — End: 2017-12-27

## 2017-12-27 NOTE — Telephone Encounter (Signed)
Pt's wife calling.  Pt had hernia surgery at [facility] on Friday and is currently in the hospital this is why she is returning the call instead of him.

## 2017-12-27 NOTE — Telephone Encounter (Signed)
LVM for pt to call back for results.

## 2017-12-27 NOTE — Telephone Encounter (Signed)
Patients wife aware of results.

## 2017-12-29 ENCOUNTER — Telehealth: Payer: Self-pay | Admitting: *Deleted

## 2017-12-29 NOTE — Telephone Encounter (Signed)
Called pt no answer LMOM RTC.../lmb 

## 2017-12-29 NOTE — Telephone Encounter (Signed)
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

## 2017-12-30 DIAGNOSIS — C678 Malignant neoplasm of overlapping sites of bladder: Secondary | ICD-10-CM | POA: Diagnosis not present

## 2017-12-30 DIAGNOSIS — Z48816 Encounter for surgical aftercare following surgery on the genitourinary system: Secondary | ICD-10-CM | POA: Diagnosis not present

## 2017-12-30 NOTE — Telephone Encounter (Signed)
Called pt spoke w/wife she states husband will be following up with surgeon.Adrian KitchenJohny Chess

## 2018-01-03 DIAGNOSIS — Z48816 Encounter for surgical aftercare following surgery on the genitourinary system: Secondary | ICD-10-CM | POA: Diagnosis not present

## 2018-01-03 DIAGNOSIS — C678 Malignant neoplasm of overlapping sites of bladder: Secondary | ICD-10-CM | POA: Diagnosis not present

## 2018-01-04 DIAGNOSIS — K432 Incisional hernia without obstruction or gangrene: Secondary | ICD-10-CM | POA: Diagnosis not present

## 2018-01-05 DIAGNOSIS — C678 Malignant neoplasm of overlapping sites of bladder: Secondary | ICD-10-CM | POA: Diagnosis not present

## 2018-01-05 DIAGNOSIS — Z48816 Encounter for surgical aftercare following surgery on the genitourinary system: Secondary | ICD-10-CM | POA: Diagnosis not present

## 2018-01-10 DIAGNOSIS — Z48816 Encounter for surgical aftercare following surgery on the genitourinary system: Secondary | ICD-10-CM | POA: Diagnosis not present

## 2018-01-10 DIAGNOSIS — C678 Malignant neoplasm of overlapping sites of bladder: Secondary | ICD-10-CM | POA: Diagnosis not present

## 2018-01-11 DIAGNOSIS — Z4889 Encounter for other specified surgical aftercare: Secondary | ICD-10-CM | POA: Diagnosis not present

## 2018-01-17 DIAGNOSIS — Z48816 Encounter for surgical aftercare following surgery on the genitourinary system: Secondary | ICD-10-CM | POA: Diagnosis not present

## 2018-01-17 DIAGNOSIS — C678 Malignant neoplasm of overlapping sites of bladder: Secondary | ICD-10-CM | POA: Diagnosis not present

## 2018-01-24 ENCOUNTER — Encounter: Payer: Self-pay | Admitting: Internal Medicine

## 2018-01-24 ENCOUNTER — Ambulatory Visit (INDEPENDENT_AMBULATORY_CARE_PROVIDER_SITE_OTHER): Payer: Medicare Other | Admitting: Internal Medicine

## 2018-01-24 DIAGNOSIS — Z48816 Encounter for surgical aftercare following surgery on the genitourinary system: Secondary | ICD-10-CM | POA: Diagnosis not present

## 2018-01-24 DIAGNOSIS — R5381 Other malaise: Secondary | ICD-10-CM | POA: Insufficient documentation

## 2018-01-24 DIAGNOSIS — R269 Unspecified abnormalities of gait and mobility: Secondary | ICD-10-CM | POA: Insufficient documentation

## 2018-01-24 DIAGNOSIS — C678 Malignant neoplasm of overlapping sites of bladder: Secondary | ICD-10-CM | POA: Diagnosis not present

## 2018-01-24 NOTE — Patient Instructions (Signed)
A referral for PT was ordered - they will call you to schedule an appointment.

## 2018-01-24 NOTE — Progress Notes (Signed)
Subjective:    Patient ID: Adrian Rios, male    DOB: 11-26-1940, 77 y.o.   MRN: [id number]  HPI The patient is here for an acute visit.   On 10/25 he had the incisional hernia surgery.  He was supposed to be laparoscopic, but when she started the surgery she discovered that he had multiple hernias and needed to open him up.  It was a 4-hour surgery.  After the surgery he was very weak-generalized weakness.  He had a evaluation by physical therapy in the hospital and he was advised to do outpatient physical therapy, but the surgery was in [place] and the referral for was that area and he did not want to drive there.  He was hoping to get a referral for physical therapy in this area.  His abdominal pain was significant first couple of weeks, but has improved.  He still has some pain, but it is tolerable.  His appetite is still decreased.  His energy level is getting better, but still low.  He has difficulty with his balance and overall is weak and deconditioned.  The recommendations upon discharge were for physical therapy for gait disorder and deconditioning.     Medications and allergies reviewed with patient and updated if appropriate.  Patient Active Problem List   Diagnosis Date Noted  . Left hip pain 11/11/2017  . Abdominal wall hernia 05/26/2017  . Recurrent urinary tract infection 11/13/2016  . Sepsis ([place]) 11/13/2016  . Chronic diastolic CHF (congestive heart failure) ([place]) 11/12/2016  . Fatigue 08/19/2016  . History of bladder cancer 06/18/2016  . History of prostate cancer 06/18/2016  . 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[place]) 06/12/2016  . Carotid bruit 02/17/2016  . Prediabetes 08/20/2015  . Overweight (BMI 25.0-29.9) 08/20/2015  . Esophageal reflux 08/20/2015  . Glaucoma 02/14/2015  . Varicose veins of lower extremities with other complications 70/35/0093  . RAD (reactive airway disease) 10/27/2011  . 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[place]), 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  . PREMATURE VENTRICULAR CONTRACTIONS  04/29/2010  . ERECTILE DYSFUNCTION 04/23/2010  . [place] DISEASE, LUMBOSACRAL SPINE 02/06/2010  . 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 Depression 04/12/2008  . Allergic rhinitis 04/12/2008  . Hyperlipidemia 10/05/2006  . Sarcoidosis ([place]) 07/05/2006    Current Outpatient Medications on File Prior to Visit  Medication Sig Dispense Refill  . ADVAIR DISKUS 250-50 MCG/DOSE AEPB INHALE 1 DOSE BY MOUTH EVERY 12 HOURS 60 each 1  . magnesium oxide (MAG-OX) 400 MG tablet Take 400 mg by mouth daily.    . Misc Natural Products (GLUCOSAMINE CHOND DOUBLE STR PO) Take 1 tablet by mouth 2 (two) times daily.    . montelukast (SINGULAIR) 10 MG tablet TAKE ONE TABLET BY MOUTH DAILY 90 tablet 0  . ranitidine (ZANTAC) 150 MG tablet TAKE 1 TABLET BY MOUTH TWICE DAILY 180 tablet 1  . tiZANidine (ZANAFLEX) 2 MG tablet Take 1 tablet (2 mg total) by mouth every 6 (six) hours as needed for muscle spasms. 40 tablet 1  . triamcinolone (NASACORT ALLERGY 24HR) 55 MCG/ACT AERO nasal inhaler Place 1 spray into the nose at bedtime.    Marland Kitchen venlafaxine (EFFEXOR) 75 MG tablet TAKE ONE TABLET BY MOUTH DAILY 90 tablet 1   No current facility-administered medications on file prior to visit.     Past Medical History:  Diagnosis Date  . Allergic rhinitis   . Arthritis   . Benign localized prostatic hyperplasia with lower urinary tract symptoms (LUTS)   . Bladder tumor   . Borderline glaucoma  of right eye   . Cancer [facility])    bladder and prostate  . Carotid bruit    per duplex 03-11-2016 RICA 1-39%  . Chronic throat clearing   . DDD (degenerative disc disease), lumbar   . Depression   . ED (erectile dysfunction)   . Elevated PSA    urologist-  dr [name]--- s/p  prostate bx's  . GERD (gastroesophageal reflux disease)   . Hematuria   . History of chronic bronchitis   . 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 History of squamous cell carcinoma excision    several times  .  Hyperlipidemia   . Pre-diabetes   . Premature ventricular contractions (PVCs) (VPCs)   . RAD (reactive airway disease)   . 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 Sensorineural hearing loss (SNHL) of both ears   . Sepsis ([place]) 09/2016  . UTI (urinary tract infection) 08/2016  . Wears glasses   . Wears hearing aid    bilateral    Past Surgical History:  Procedure Laterality Date  . APPENDECTOMY  2008  . CARDIOVASCULAR STRESS TEST  05/27/2010   normal nuclear study w/ no ischemia/  normal LV function and wall motion , ef 60%  . CYSTOSCOPY WITH INJECTION N/A 06/12/2016   Procedure: CYSTOSCOPY WITH INJECTION OF INDOCYANINE GREEN DYE;  Surgeon: Alexis Frock, MD;  Location: [facility];  Service: Urology;  Laterality: N/A;  . DEEP NECK LYMPH NODE BIOPSY / EXCISION  1970's   and Bronchoscopy w/ bx's ( dx Sarcoidosis)  . ILEOSTOMY    . IR NEPHROSTOMY PLACEMENT LEFT  11/18/2016  . MOHS SURGERY  2013 approx.    nasal; pre melanoma  . PARS PLANA VITRECTOMY Right 2007   repair macular pucker  . TONSILLECTOMY AND ADENOIDECTOMY  child  . TRANSURETHRAL RESECTION OF BLADDER TUMOR N/A 04/06/2016   Procedure: CYSTOSCOPY TRANSURETHRAL RESECTION OF BLADDER TUMORS (TURBT);  Surgeon: Carolan Clines, MD;  Location: [facility];  Service: Urology;  Laterality: N/A;  . TRANSURETHRAL RESECTION OF PROSTATE      Social History   Socioeconomic History  . Marital status: Married    Spouse name: Not on file  . Number of children: Not on file  . Years of education: Not on file  . Highest education level: Not on file  Occupational History  . Not on file  Social Needs  . Financial resource strain: Not hard at all  . Food insecurity:    Worry: Never true    Inability: Never true  . Transportation needs:    Medical: No    Non-medical: No  Tobacco Use  . Smoking status: Never Smoker  . Smokeless tobacco:  Never Used  Substance and Sexual Activity  . Alcohol use: No  . Drug use: No  . Sexual activity: Not Currently  Lifestyle  . Physical activity:    Days per week: 4 days    Minutes per session: 40 min  . Stress: Only a little  Relationships  . Social connections:    Talks on phone: More than three times a week    Gets together: More than three times a week    Attends religious service: More than 4 times per year    Active member of club or organization: Not on file    Attends meetings of  clubs or organizations: More than 4 times per year    Relationship status: Married  Other Topics Concern  . Not on file  Social History Narrative  . Not on file    Family History  Problem Relation Age of Onset  . Stroke Mother        in her 58s  . Breast cancer Sister   . Heart disease Maternal Grandmother 84       MI   . Breast cancer Paternal Grandmother   . Heart disease Paternal Grandfather 103       MI  . Stroke Brother   . Diabetes Neg Hx     Review of Systems  Constitutional: Positive for appetite change (decreased). Negative for chills and fever.  Respiratory: Negative for shortness of breath.   Cardiovascular: Negative for chest pain.  Gastrointestinal: Positive for abdominal pain (from ).  Musculoskeletal: Positive for back pain (some) and gait problem. Negative for myalgias.  Neurological: Positive for dizziness, weakness (generalized muscle weakness) and headaches.       Objective:   Vitals:   01/24/18 1548  BP: 104/68  Pulse: 78  Resp: 16  Temp: 98.3 F (36.8 C)  SpO2: 97%   BP Readings from Last 3 Encounters:  01/24/18 104/68  11/11/17 102/64  10/01/17 120/80   Wt Readings from Last 3 Encounters:  01/24/18 169 lb (76.7 kg)  11/11/17 175 lb (79.4 kg)  10/01/17 175 lb (79.4 kg)   Body mass index is 26.47 kg/m.   Physical Exam  Constitutional: He appears well-developed and well-nourished. No distress.  HENT:  Head: Normocephalic and atraumatic.    Neck: Neck supple. No thyromegaly present.  Cardiovascular: Normal rate and regular rhythm.  Pulmonary/Chest: Effort normal and breath sounds normal. He has no wheezes. He has no rales.  Musculoskeletal: He exhibits no edema.  Lymphadenopathy:    He has no cervical adenopathy.  Neurological: No sensory deficit. He exhibits normal muscle tone.  Skin: Skin is warm and dry. He is not diaphoretic.           Assessment & Plan:    See Problem List for Assessment and Plan of chronic medical problems.

## 2018-01-24 NOTE — Assessment & Plan Note (Signed)
Difficulty with his balance Would benefit from physical therapy Referral ordered

## 2018-01-24 NOTE — Assessment & Plan Note (Signed)
Has decreased stamina and physical deconditioning/generalized weakness Would benefit from physical therapy Outpatient physical therapy ordered

## 2018-01-31 ENCOUNTER — Encounter: Payer: Self-pay | Admitting: Internal Medicine

## 2018-02-01 ENCOUNTER — Other Ambulatory Visit: Payer: Self-pay | Admitting: Internal Medicine

## 2018-02-02 NOTE — Telephone Encounter (Signed)
Looks like it was sent for the brand name.  Is this all set or do we need to do something more?

## 2018-02-03 NOTE — Telephone Encounter (Signed)
MD sent [name] name rx nothing else is needed. Closing encounter.Marland KitchenJohny Chess

## 2018-02-09 ENCOUNTER — Ambulatory Visit: Payer: Medicare Other | Attending: Internal Medicine | Admitting: Physical Therapy

## 2018-02-09 ENCOUNTER — Other Ambulatory Visit: Payer: Self-pay

## 2018-02-09 ENCOUNTER — Encounter: Payer: Self-pay | Admitting: Physical Therapy

## 2018-02-09 DIAGNOSIS — M6281 Muscle weakness (generalized): Secondary | ICD-10-CM

## 2018-02-09 DIAGNOSIS — R6889 Other general symptoms and signs: Secondary | ICD-10-CM | POA: Diagnosis not present

## 2018-02-09 NOTE — Therapy (Signed)
[facility] [place], [name], [address] Phone: [phone]   Fax:  [phone]  Physical Therapy Evaluation  Patient Details  Name: Adrian Rios MRN: [id number] Date of Birth: 12/06/1940 Referring Provider (PT): Billey Gosling   Encounter Date: 02/09/2018  PT End of Session - 02/09/18 1041    Visit Number  1    Date for PT Re-Evaluation  04/12/18    PT Start Time  1004    PT Stop Time  1049    PT Time Calculation (min)  45 min    Activity Tolerance  Patient tolerated treatment well    Behavior During Therapy  [facility] for tasks assessed/performed       Past Medical History:  Diagnosis Date  . Allergic rhinitis   . Arthritis   . Benign localized prostatic hyperplasia with lower urinary tract symptoms (LUTS)   . Bladder tumor   . Borderline glaucoma of right eye   . Cancer [facility])    bladder and prostate  . Carotid bruit    per duplex 03-11-2016 RICA 1-39%  . Chronic throat clearing   . DDD (degenerative disc disease), lumbar   . Depression   . ED (erectile dysfunction)   . Elevated PSA    urologist-  dr [name]--- s/p  prostate bx's  . GERD (gastroesophageal reflux disease)   . Hematuria   . History of chronic bronchitis   . 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 History of squamous cell carcinoma excision    several times  . Hyperlipidemia   . Pre-diabetes   . Premature ventricular contractions (PVCs) (VPCs)   . RAD (reactive airway disease)   . 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 Sensorineural hearing loss (SNHL) of both ears   . Sepsis ([place]) 09/2016  . UTI (urinary tract infection) 08/2016  . Wears glasses   . Wears hearing aid    bilateral    Past Surgical History:  Procedure Laterality Date  . APPENDECTOMY  2008  . CARDIOVASCULAR STRESS TEST  05/27/2010   normal  nuclear study w/ no ischemia/  normal LV function and wall motion , ef 60%  . CYSTOSCOPY WITH INJECTION N/A 06/12/2016   Procedure: CYSTOSCOPY WITH INJECTION OF INDOCYANINE GREEN DYE;  Surgeon: Alexis Frock, MD;  Location[facility];  Service: Urology;  Laterality: N/A;  . DEEP NECK LYMPH NODE BIOPSY / EXCISION  1970's   and Bronchoscopy w/ bx's ( dx Sarcoidosis)  . ILEOSTOMY    . IR NEPHROSTOMY PLACEMENT LEFT  11/18/2016  . MOHS SURGERY  2013 approx.    nasal; pre melanoma  . PARS PLANA VITRECTOMY Right 2007   repair macular pucker  . TONSILLECTOMY AND ADENOIDECTOMY  child  . TRANSURETHRAL RESECTION OF BLADDER TUMOR N/A 04/06/2016   Procedure: CYSTOSCOPY TRANSURETHRAL RESECTION OF BLADDER TUMORS (TURBT);  Surgeon: Carolan Clines, MD;  Location[facility];  Service: Urology;  Laterality: N/A;  . TRANSURETHRAL RESECTION OF PROSTATE      There were no vitals filed for this visit.   Subjective Assessment - 02/09/18 1008    Subjective  Patient reports that he has had 6 surgeries in the past 2 years, he reports that he underwent a hernia surgery on 12/24/17, he reports that it was a pretty invasive  surgery and he has been very weak and debilitated since that time, reports that he runs out of energy fast.  Reports dificulty on steps    Patient Stated Goals  be stronger    Currently in Pain?  No/denies         [facility] PT Assessment - 02/09/18 0001      Assessment   Medical Diagnosis  weakness    Referring Provider (PT)  Marzetta Board Burns    Onset Date/Surgical Date  12/24/17    Prior Therapy  no      Precautions   Precautions  None    Precaution Comments  no lifting anything heavy      Balance Screen   Has the patient fallen in the past 6 months  No    Has the patient had a decrease in activity level because of a fear of falling?   No    Is the patient reluctant to leave their home because of a fear of falling?   No      Home Environment   Additional Comments  has  stairs, some yardwork      Prior Function   Level of Independence  Independent    Vocation  Retired    Leisure  walks the Physicist, medical Comments  fwd head, rounded       ROM / Strength   AROM / PROM / Strength  Strength      Strength   Overall Strength Comments  LE strength 4-/5 with c/o weakness      Flexibility   Soft Tissue Assessment /Muscle Length  yes    Hamstrings  very tight 40 degree SLR    Piriformis  very tight      Palpation   Palpation comment  seems to have good mm tone in the LE's      Transfers   Comments  uses hands to get up from sitting      Ambulation/Gait   Gait Comments  gait is stiff in the trunk and pelvic area, reports SOB with climbing stairs but can go up step over step with handrail                Objective measurements completed on examination: See above findings.      [facility] Adult PT Treatment/Exercise - 02/09/18 0001      Exercises   Exercises  Knee/Hip      Knee/Hip Exercises: Aerobic   Nustep  Level 3 x 5 minutes      Knee/Hip Exercises: Machines for Strengthening   Cybex Knee Extension  5# 2x10    Cybex Knee Flexion  20# 2x10    Other Machine  seated rows, lats 15# 2x10 each               PT Short Term Goals - 02/09/18 1045      PT SHORT TERM GOAL #1   Title  issue HEP    Time  2    Period  Weeks    Status  New        PT Long Term Goals - 02/09/18 1046      PT LONG TERM GOAL #1   Title  independent with an advanced HEP    Time  8    Period  Weeks    Status  New      PT LONG TERM GOAL #2   Title  report no difficulty going up  or down stairs    Time  8    Period  Weeks    Status  New      PT LONG TERM GOAL #3   Title  report 50% fewer days where he "feels bad"    Time  8    Period  Weeks    Status  New      PT LONG TERM GOAL #4   Title  report that he can blow leaves or do light yardwork without feeling "wiped out"    Time  8    Period  Weeks    Status   New             Plan - 02/09/18 1041    Clinical Impression Statement  Patient reports that he has had 6 surgeries in the past 2 years.  He reports that the most recent one was a hernia surgery that was complicated, it was 32/44/01, he reports that he has felt lethargic, weak and overall having more difficulty with ADL's since that time.  He seems to have good mm tone, is very tight in the LE mms, SOB climbing one flight of stairs.  Reports that there are days he does not feel good all day "less energy" and that there are days he is okay until 6PM and then is "just spent"    Clinical Presentation  Evolving    Clinical Decision Making  Low    Rehab Potential  Good    PT Frequency  2x / week    PT Duration  8 weeks    PT Treatment/Interventions  ADLs/Self Care Home Management;Gait training;Balance training;Therapeutic exercise;Therapeutic activities;Functional mobility training;Stair training;Patient/family education;Manual techniques    PT Next Visit Plan  slowly add exercises in the gym, could give good HEP, he has an elliptical at home but has not used it in a long time, I encouraged him to walk the dog    Consulted and Agree with Plan of Care  Patient       Patient will benefit from skilled therapeutic intervention in order to improve the following deficits and impairments:  Decreased range of motion, Difficulty walking, Cardiopulmonary status limiting activity, Decreased activity tolerance, Impaired flexibility, Decreased strength, Decreased mobility  Visit Diagnosis: Muscle weakness (generalized) - Plan: PT plan of care cert/re-cert  Decreased activity tolerance - Plan: PT plan of care cert/re-cert     Problem List Patient Active Problem List   Diagnosis Date Noted  . Physical deconditioning 01/24/2018  . Gait disorder 01/24/2018  . Left hip pain 11/11/2017  . Abdominal wall hernia 05/26/2017  . Recurrent urinary tract infection 11/13/2016  . Sepsis ([place]) 11/13/2016  .  Chronic diastolic CHF (congestive heart failure) ([place]) 11/12/2016  . Fatigue 08/19/2016  . History of bladder cancer 06/18/2016  . History of prostate cancer 06/18/2016  . 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 Carotid bruit 02/17/2016  . Prediabetes 08/20/2015  . Overweight (BMI 25.0-29.9) 08/20/2015  . Esophageal reflux 08/20/2015  . Glaucoma 02/14/2015  . Varicose veins of lower extremities with other complications 02/72/5366  . RAD (reactive airway disease) 10/27/2011  . 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 ERECTILE DYSFUNCTION 04/23/2010  . [place] DISEASE, LUMBOSACRAL SPINE 02/06/2010  . 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 Depression 04/12/2008  . Allergic rhinitis 04/12/2008  . Hyperlipidemia 10/05/2006  . Sarcoidosis ([place]) 07/05/2006    Sumner Boast., PT 02/09/2018, 10:50 AM  [facility]  [place], [name], [address] Phone: [phone]   Fax:  [phone]  Name: Adrian Rios MRN: [id number] Date of Birth: 07/30/1940

## 2018-02-15 ENCOUNTER — Encounter: Payer: Self-pay | Admitting: Physical Therapy

## 2018-02-15 ENCOUNTER — Ambulatory Visit: Payer: Medicare Other | Admitting: Physical Therapy

## 2018-02-15 DIAGNOSIS — M6281 Muscle weakness (generalized): Secondary | ICD-10-CM

## 2018-02-15 DIAGNOSIS — R6889 Other general symptoms and signs: Secondary | ICD-10-CM

## 2018-02-15 NOTE — Therapy (Signed)
[place], [name], [address] Phone: [phone]   Fax:  [phone]  Physical Therapy Treatment  Patient Details  Name: Adrian Rios MRN: [id number] Date of Birth: 23-Jun-1940 Referring Provider (PT): Billey Gosling   Encounter Date: 02/15/2018  PT End of Session - 02/15/18 0946    Visit Number  2    Date for PT Re-Evaluation  04/12/18    PT Start Time  0935    PT Stop Time  1013    PT Time Calculation (min)  38 min       Past Medical History:  Diagnosis Date  . Allergic rhinitis   . Arthritis   . Benign localized prostatic hyperplasia with lower urinary tract symptoms (LUTS)   . Bladder tumor   . Borderline glaucoma of right eye   . Cancer [facility])    bladder and prostate  . Carotid bruit    per duplex 03-11-2016 RICA 1-39%  . Chronic throat clearing   . DDD (degenerative disc disease), lumbar   . Depression   . ED (erectile dysfunction)   . Elevated PSA    urologist-  dr [name]--- s/p  prostate bx's  . GERD (gastroesophageal reflux disease)   . Hematuria   . History of chronic bronchitis   . 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 History of squamous cell carcinoma excision    several times  . Hyperlipidemia   . Pre-diabetes   . Premature ventricular contractions (PVCs) (VPCs)   . RAD (reactive airway disease)   . 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 Sensorineural hearing loss (SNHL) of both ears   . Sepsis ([place]) 09/2016  . UTI (urinary tract infection) 08/2016  . Wears glasses   . Wears hearing aid    bilateral    Past Surgical History:  Procedure Laterality Date  . APPENDECTOMY  2008  . CARDIOVASCULAR STRESS TEST  05/27/2010   normal nuclear study w/ no ischemia/  normal LV function and wall motion , ef 60%  . CYSTOSCOPY WITH INJECTION N/A 06/12/2016    Procedure: CYSTOSCOPY WITH INJECTION OF INDOCYANINE GREEN DYE;  Surgeon: Alexis Frock, MD;  Location: [facility];  Service: Urology;  Laterality: N/A;  . DEEP NECK LYMPH NODE BIOPSY / EXCISION  1970's   and Bronchoscopy w/ bx's ( dx Sarcoidosis)  . ILEOSTOMY    . IR NEPHROSTOMY PLACEMENT LEFT  11/18/2016  . MOHS SURGERY  2013 approx.    nasal; pre melanoma  . PARS PLANA VITRECTOMY Right 2007   repair macular pucker  . TONSILLECTOMY AND ADENOIDECTOMY  child  . TRANSURETHRAL RESECTION OF BLADDER TUMOR N/A 04/06/2016   Procedure: CYSTOSCOPY TRANSURETHRAL RESECTION OF BLADDER TUMORS (TURBT);  Surgeon: Carolan Clines, MD;  Location: [facility];  Service: Urology;  Laterality: N/A;  . TRANSURETHRAL RESECTION OF PROSTATE      There were no vitals filed for this visit.  Subjective Assessment - 02/15/18 0937    Subjective  Pt reports he was sore after last visit.  No changes otherwise since last visit.  He's looking to get help with increasing his energy level.     Currently in Pain?  Yes    Pain Score  3     Pain Location  Back   Kidney,  per pt   Pain Orientation  Right    Pain Descriptors / Indicators  Dull;Aching    Aggravating Factors   ?    Pain Relieving Factors  ?         [facility] PT Assessment - 02/15/18 0001      Assessment   Medical Diagnosis  weakness    Referring Provider (PT)  Marzetta Board Burns    Onset Date/Surgical Date  12/24/17    Prior Therapy  no       [facility] Adult PT Treatment/Exercise - 02/15/18 0001      Knee/Hip Exercises: Stretches   Passive Hamstring Stretch  Right;Left;2 reps;30 seconds    Quad Stretch  Right;Left;2 reps;30 seconds    Hip Flexor Stretch  Right;Left;2 reps;30 seconds    Piriformis Stretch  Right;Left;2 reps;30 seconds    Gastroc Stretch  Right;Left;2 reps;30 seconds      Knee/Hip Exercises: Aerobic   Elliptical  L4 (height): 3 min    fatigued quickly     Knee/Hip Exercises: Standing   Forward Step Up  Right;Left;1 set;10  reps;Hand Hold: 1;Step Height: 6"    SLS  Rt/Lt SLS x 15 sec ; repeated with horiz head turns (occasional UE support on counter to steady)       Knee/Hip Exercises: Seated   Sit to Sand  10 reps;without UE support   eccentric lowering       PT Education - 02/15/18 1129    Education Details  HEP     Person(s) Educated  Patient    Methods  Explanation;Handout    Comprehension  Verbalized understanding;Returned demonstration       PT Short Term Goals - 02/15/18 1133      PT SHORT TERM GOAL #1   Title  issue HEP    Time  2    Period  Weeks    Status  Achieved        PT Long Term Goals - 02/09/18 1046      PT LONG TERM GOAL #1   Title  independent with an advanced HEP    Time  8    Period  Weeks    Status  New      PT LONG TERM GOAL #2   Title  report no difficulty going up or down stairs    Time  8    Period  Weeks    Status  New      PT LONG TERM GOAL #3   Title  report 50% fewer days where he "feels bad"    Time  8    Period  Weeks    Status  New      PT LONG TERM GOAL #4   Title  report that he can blow leaves or do light yardwork without feeling "wiped out"    Time  8    Period  Weeks    Status  New            Plan - 02/15/18 1129    Clinical Impression Statement  Pt fatigued quickly with eliptical; tolerated 3 min on low setting.  Pt tolerated all other exercises well, without requiring rest breaks.  Pt was encouraged to begin walking dog for short trials to build up stamina (wasn't allowed to walk dog for 6 wks since last surgery; now cleared to resume).  Pt is progressing towards goals and will benefit from continued PT intervention to maximize endurance, mobility, and safety.  STG #1 has been  met.    Rehab Potential  Good    PT Frequency  2x / week    PT Duration  8 weeks    PT Treatment/Interventions  ADLs/Self Care Home Management;Gait training;Balance training;Therapeutic exercise;Therapeutic activities;Functional mobility training;Stair  training;Patient/family education;Manual techniques    PT Next Visit Plan  continue progressive exercises to build tolerance/endurance.  Review stretches from last visit.     Consulted and Agree with Plan of Care  Patient       Patient will benefit from skilled therapeutic intervention in order to improve the following deficits and impairments:  Decreased range of motion, Difficulty walking, Cardiopulmonary status limiting activity, Decreased activity tolerance, Impaired flexibility, Decreased strength, Decreased mobility  Visit Diagnosis: Muscle weakness (generalized)  Decreased activity tolerance     Problem List Patient Active Problem List   Diagnosis Date Noted  . Physical deconditioning 01/24/2018  . Gait disorder 01/24/2018  . Left hip pain 11/11/2017  . Abdominal wall hernia 05/26/2017  . Recurrent urinary tract infection 11/13/2016  . Sepsis ([place]) 11/13/2016  . Chronic diastolic CHF (congestive heart failure) ([place]) 11/12/2016  . Fatigue 08/19/2016  . History of bladder cancer 06/18/2016  . History of prostate cancer 06/18/2016  . 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[place]) 06/12/2016  . Carotid bruit 02/17/2016  . Prediabetes 08/20/2015  . Overweight (BMI 25.0-29.9) 08/20/2015  . Esophageal reflux 08/20/2015  . Glaucoma 02/14/2015  . Varicose veins of lower extremities with other complications 82/99/3716  . RAD (reactive airway disease) 10/27/2011  . 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[place]), 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  . 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 ERECTILE DYSFUNCTION 04/23/2010  . [place] DISEASE, LUMBOSACRAL SPINE 02/06/2010  . 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 Depression 04/12/2008  . Allergic rhinitis 04/12/2008  . Hyperlipidemia 10/05/2006  . Sarcoidosis ([place]) 07/05/2006   Kerin Perna, PTA 02/15/18 11:34 AM  [place], [name], [address] Phone: [phone]   Fax:   [phone]  Name: Adrian Rios MRN: [id number] Date of Birth: 10/25/1940

## 2018-02-15 NOTE — Patient Instructions (Signed)
All stretches 30 seconds, 2 reps, 2x/day:  Seated quad stretch  Seated hip flexor stretch Seated hamstring stretch Calf stretch Piriformis stretch, seated  Strengthening:  Sit to/from stand with slowly lowering to chair x 10 reps Forward step up with holding rail x 10 reps  Balance:  Single leg stance x 15-30 sec, x 3 reps each leg.

## 2018-02-21 ENCOUNTER — Other Ambulatory Visit: Payer: Self-pay | Admitting: Internal Medicine

## 2018-02-22 ENCOUNTER — Encounter: Payer: Self-pay | Admitting: Physical Therapy

## 2018-02-22 ENCOUNTER — Ambulatory Visit: Payer: Medicare Other | Admitting: Physical Therapy

## 2018-02-22 DIAGNOSIS — M6281 Muscle weakness (generalized): Secondary | ICD-10-CM

## 2018-02-22 DIAGNOSIS — R6889 Other general symptoms and signs: Secondary | ICD-10-CM

## 2018-02-22 NOTE — Therapy (Signed)
[place], [name], [address] Phone: [phone]   Fax:  [phone]  Physical Therapy Treatment  Patient Details  Name: Adrian Rios MRN: [id number] Date of Birth: May 22, 1940 Referring Provider (PT): Billey Gosling   Encounter Date: 02/22/2018  PT End of Session - 02/22/18 1050    Visit Number  3    Date for PT Re-Evaluation  04/12/18    PT Start Time  8546    PT Stop Time  1055    PT Time Calculation (min)  40 min    Activity Tolerance  Patient tolerated treatment well    Behavior During Therapy  [facility] for tasks assessed/performed       Past Medical History:  Diagnosis Date  . Allergic rhinitis   . Arthritis   . Benign localized prostatic hyperplasia with lower urinary tract symptoms (LUTS)   . Bladder tumor   . Borderline glaucoma of right eye   . Cancer [facility])    bladder and prostate  . Carotid bruit    per duplex 03-11-2016 RICA 1-39%  . Chronic throat clearing   . DDD (degenerative disc disease), lumbar   . Depression   . ED (erectile dysfunction)   . Elevated PSA    urologist-  dr [name]--- s/p  prostate bx's  . GERD (gastroesophageal reflux disease)   . Hematuria   . History of chronic bronchitis   . 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 History of squamous cell carcinoma excision    several times  . Hyperlipidemia   . Pre-diabetes   . Premature ventricular contractions (PVCs) (VPCs)   . RAD (reactive airway disease)   . 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 Sensorineural hearing loss (SNHL) of both ears   . Sepsis ([place]) 09/2016  . UTI (urinary tract infection) 08/2016  . Wears glasses   . Wears hearing aid    bilateral    Past Surgical History:  Procedure Laterality Date  . APPENDECTOMY  2008  . CARDIOVASCULAR STRESS TEST  05/27/2010   normal  nuclear study w/ no ischemia/  normal LV function and wall motion , ef 60%  . CYSTOSCOPY WITH INJECTION N/A 06/12/2016   Procedure: CYSTOSCOPY WITH INJECTION OF INDOCYANINE GREEN DYE;  Surgeon: Alexis Frock, MD;  [facility];  Service: Urology;  Laterality: N/A;  . DEEP NECK LYMPH NODE BIOPSY / EXCISION  1970's   and Bronchoscopy w/ bx's ( dx Sarcoidosis)  . ILEOSTOMY    . IR NEPHROSTOMY PLACEMENT LEFT  11/18/2016  . MOHS SURGERY  2013 approx.    nasal; pre melanoma  . PARS PLANA VITRECTOMY Right 2007   repair macular pucker  . TONSILLECTOMY AND ADENOIDECTOMY  child  . TRANSURETHRAL RESECTION OF BLADDER TUMOR N/A 04/06/2016   Procedure: CYSTOSCOPY TRANSURETHRAL RESECTION OF BLADDER TUMORS (TURBT);  Surgeon: Carolan Clines, MD;  Location[facility];  Service: Urology;  Laterality: N/A;  . TRANSURETHRAL RESECTION OF PROSTATE      There were no vitals filed for this visit.  Subjective Assessment - 02/22/18 1018    Subjective  "The main reason Im here is to get my energy levels back" "Do pretty good at the beginning of the day, by the end I am wiped out"    Currently  in Pain?  No/denies                       [facility] Adult PT Treatment/Exercise - 02/22/18 0001      Knee/Hip Exercises: Aerobic   Elliptical  I8 R 4 18min each way    Recumbent Bike  L0x5 min     Nustep  Level 3 x 5 minutes      Knee/Hip Exercises: Machines for Strengthening   Cybex Knee Extension  5# 3x10    Cybex Knee Flexion  20# 2x10    Other Machine  seated rows, lats 20lb 2x10 each      Knee/Hip Exercises: Standing   Forward Step Up  Both;1 set;10 reps;Hand Hold: 0;Step Height: 6"      Knee/Hip Exercises: Seated   Sit to Sand  10 reps;without UE support;2 sets   support on knees               PT Short Term Goals - 02/22/18 1053      PT SHORT TERM GOAL #1   Title  issue HEP    Status  Achieved        PT Long Term Goals - 02/22/18 1053      PT LONG TERM  GOAL #1   Title  independent with an advanced HEP    Status  On-going      PT LONG TERM GOAL #2   Title  report no difficulty going up or down stairs    Status  On-going      PT LONG TERM GOAL #3   Title  report 50% fewer days where he "feels bad"    Status  New            Plan - 02/22/18 1051    Clinical Impression Statement  Pt continues to fatigue quickly on elliptical, cues to relax and not put too much strain in arms. He did well overall with machine level exercises demo ing good ROM with all exercises. Cues to old muscle contraction with seated rows. One bout of LO during step ups, no assist needed to correct.    Rehab Potential  Good    PT Frequency  2x / week    PT Duration  8 weeks    PT Treatment/Interventions  ADLs/Self Care Home Management;Gait training;Balance training;Therapeutic exercise;Therapeutic activities;Functional mobility training;Stair training;Patient/family education;Manual techniques    PT Next Visit Plan  continue progressive exercises to build tolerance/endurance.  Review stretches from last visit.        Patient will benefit from skilled therapeutic intervention in order to improve the following deficits and impairments:  Decreased range of motion, Difficulty walking, Cardiopulmonary status limiting activity, Decreased activity tolerance, Impaired flexibility, Decreased strength, Decreased mobility  Visit Diagnosis: Decreased activity tolerance  Muscle weakness (generalized)     Problem List Patient Active Problem List   Diagnosis Date Noted  . Physical deconditioning 01/24/2018  . Gait disorder 01/24/2018  . Left hip pain 11/11/2017  . Abdominal wall hernia 05/26/2017  . Recurrent urinary tract infection 11/13/2016  . Sepsis ([place]) 11/13/2016  . Chronic diastolic CHF (congestive heart failure) ([place]) 11/12/2016  . Fatigue 08/19/2016  . History of bladder cancer 06/18/2016  . History of prostate cancer 06/18/2016  . S/P ileal conduit  ([place]) 06/12/2016  . Carotid bruit 02/17/2016  . Prediabetes 08/20/2015  . Overweight (BMI 25.0-29.9) 08/20/2015  . Esophageal reflux 08/20/2015  . Glaucoma 02/14/2015  . Varicose veins of lower extremities  with other complications 85/92/7639  . RAD (reactive airway disease) 10/27/2011  . 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 ERECTILE DYSFUNCTION 04/23/2010  . [place] DISEASE, LUMBOSACRAL SPINE 02/06/2010  . 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 Depression 04/12/2008  . Allergic rhinitis 04/12/2008  . Hyperlipidemia 10/05/2006  . Sarcoidosis ([place]) 07/05/2006    Scot Jun, PTA 02/22/2018, 10:54 AM  [place], [name], [address] Phone: [phone]   Fax:  [phone]  Name: CRISTAL HOWATT MRN: [id number] Date of Birth: 01/02/1941

## 2018-03-01 ENCOUNTER — Ambulatory Visit: Payer: Medicare Other | Admitting: Physical Therapy

## 2018-03-01 DIAGNOSIS — M6281 Muscle weakness (generalized): Secondary | ICD-10-CM

## 2018-03-01 DIAGNOSIS — R6889 Other general symptoms and signs: Secondary | ICD-10-CM | POA: Diagnosis not present

## 2018-03-01 NOTE — Therapy (Signed)
[place], [name], [address] Phone: [phone]   Fax:  [phone]  Physical Therapy Treatment  Patient Details  Name: Adrian Rios MRN: [id number] Date of Birth: 1941-01-05 Referring Provider (PT): Billey Gosling   Encounter Date: 03/01/2018  PT End of Session - 03/01/18 1031    Visit Number  4    Date for PT Re-Evaluation  04/12/18    PT Start Time  1028   pt 13 min late   PT Stop Time  1100    PT Time Calculation (min)  32 min    Activity Tolerance  Patient tolerated treatment well    Behavior During Therapy  [facility] for tasks assessed/performed       Past Medical History:  Diagnosis Date  . Allergic rhinitis   . Arthritis   . Benign localized prostatic hyperplasia with lower urinary tract symptoms (LUTS)   . Bladder tumor   . Borderline glaucoma of right eye   . Cancer [facility])    bladder and prostate  . Carotid bruit    per duplex 03-11-2016 RICA 1-39%  . Chronic throat clearing   . DDD (degenerative disc disease), lumbar   . Depression   . ED (erectile dysfunction)   . Elevated PSA    urologist-  dr [name]--- s/p  prostate bx's  . GERD (gastroesophageal reflux disease)   . Hematuria   . History of chronic bronchitis   . 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 History of squamous cell carcinoma excision    several times  . Hyperlipidemia   . Pre-diabetes   . Premature ventricular contractions (PVCs) (VPCs)   . RAD (reactive airway disease)   . 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 Sensorineural hearing loss (SNHL) of both ears   . Sepsis ([place]) 09/2016  . UTI (urinary tract infection) 08/2016  . Wears glasses   . Wears hearing aid    bilateral    Past Surgical History:  Procedure Laterality Date  . APPENDECTOMY  2008  . CARDIOVASCULAR STRESS TEST  05/27/2010    normal nuclear study w/ no ischemia/  normal LV function and wall motion , ef 60%  . CYSTOSCOPY WITH INJECTION N/A 06/12/2016   Procedure: CYSTOSCOPY WITH INJECTION OF INDOCYANINE GREEN DYE;  Surgeon: Alexis Frock, MD;  [facility];  Service: Urology;  Laterality: N/A;  . DEEP NECK LYMPH NODE BIOPSY / EXCISION  1970's   and Bronchoscopy w/ bx's ( dx Sarcoidosis)  . ILEOSTOMY    . IR NEPHROSTOMY PLACEMENT LEFT  11/18/2016  . MOHS SURGERY  2013 approx.    nasal; pre melanoma  . PARS PLANA VITRECTOMY Right 2007   repair macular pucker  . TONSILLECTOMY AND ADENOIDECTOMY  child  . TRANSURETHRAL RESECTION OF BLADDER TUMOR N/A 04/06/2016   Procedure: CYSTOSCOPY TRANSURETHRAL RESECTION OF BLADDER TUMORS (TURBT);  Surgeon: Carolan Clines, MD;  Location: [facility];  Service: Urology;  Laterality: N/A;  . TRANSURETHRAL RESECTION OF PROSTATE      There were no vitals filed for this visit.  Subjective Assessment - 03/01/18 1030    Subjective  Pt relays "you all are helping me with my endurance"    Currently in Pain?  No/denies  [place] Adult PT Treatment/Exercise - 03/01/18 0001      Knee/Hip Exercises: Aerobic   Elliptical  I8 R 4 58min fwd, 2 min bkwd    Recumbent Bike  L0x5 min     Nustep  L5 X 5 min UE/LE      Knee/Hip Exercises: Machines for Strengthening   Cybex Knee Extension  10# 3X10    Cybex Knee Flexion  25# 3X10    Other Machine  seated rows, lats 20lb 3x10 each      Knee/Hip Exercises: Standing   Other Standing Knee Exercises  up/down 2 flights of stairs in stairwell               PT Short Term Goals - 02/22/18 1053      PT SHORT TERM GOAL #1   Title  issue HEP    Status  Achieved        PT Long Term Goals - 02/22/18 1053      PT LONG TERM GOAL #1   Title  independent with an advanced HEP    Status  On-going      PT LONG TERM GOAL #2   Title  report no difficulty going up or down stairs     Status  On-going      PT LONG TERM GOAL #3   Title  report 50% fewer days where he "feels bad"    Status  New            Plan - 03/01/18 1056    Clinical Impression Statement  Pt late so some therex was held due to time constraints. He was able to progress his resistance today for general strengthening program and add in walking up/down 2 flights of stairs in the strairwell without complaints and he required less rest breaks today. PT will continue to progress endurance and strength as able.     Rehab Potential  Good    PT Frequency  2x / week    PT Duration  8 weeks    PT Treatment/Interventions  ADLs/Self Care Home Management;Gait training;Balance training;Therapeutic exercise;Therapeutic activities;Functional mobility training;Stair training;Patient/family education;Manual techniques    PT Next Visit Plan  continue progressive exercises to build tolerance/endurance.  Review stretches from last visit.     Consulted and Agree with Plan of Care  Patient       Patient will benefit from skilled therapeutic intervention in order to improve the following deficits and impairments:  Decreased range of motion, Difficulty walking, Cardiopulmonary status limiting activity, Decreased activity tolerance, Impaired flexibility, Decreased strength, Decreased mobility  Visit Diagnosis: Decreased activity tolerance  Muscle weakness (generalized)     Problem List Patient Active Problem List   Diagnosis Date Noted  . Physical deconditioning 01/24/2018  . Gait disorder 01/24/2018  . Left hip pain 11/11/2017  . Abdominal wall hernia 05/26/2017  . Recurrent urinary tract infection 11/13/2016  . Sepsis ([place]) 11/13/2016  . Chronic diastolic CHF (congestive heart failure) ([place]) 11/12/2016  . Fatigue 08/19/2016  . History of bladder cancer 06/18/2016  . History of prostate cancer 06/18/2016  . 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[place]) 06/12/2016  . Carotid bruit 02/17/2016  . Prediabetes 08/20/2015  .  Overweight (BMI 25.0-29.9) 08/20/2015  . Esophageal reflux 08/20/2015  . Glaucoma 02/14/2015  . Varicose veins of lower extremities with other complications 85/04/7739  . RAD (reactive airway disease) 10/27/2011  . 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 ERECTILE DYSFUNCTION 04/23/2010  . [place]C DISEASE,  LUMBOSACRAL SPINE 02/06/2010  . 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 Depression 04/12/2008  . Allergic rhinitis 04/12/2008  . Hyperlipidemia 10/05/2006  . Sarcoidosis ([place]) 07/05/2006    Silvestre Mesi 03/01/2018, 11:00 AM  [place], [name], [address] Phone: [phone]   Fax:  [phone]  Name: QUADE RAMIREZ MRN: [id number] Date of Birth: Jul 30, 1940

## 2018-03-08 ENCOUNTER — Ambulatory Visit: Payer: Medicare Other | Admitting: Physical Therapy

## 2018-03-08 DIAGNOSIS — H4089 Other specified glaucoma: Secondary | ICD-10-CM | POA: Diagnosis not present

## 2018-03-08 DIAGNOSIS — H4053X4 Glaucoma secondary to other eye disorders, bilateral, indeterminate stage: Secondary | ICD-10-CM | POA: Diagnosis not present

## 2018-03-09 ENCOUNTER — Encounter: Payer: Self-pay | Admitting: Physical Therapy

## 2018-03-09 ENCOUNTER — Ambulatory Visit: Payer: Medicare Other | Attending: Internal Medicine | Admitting: Physical Therapy

## 2018-03-09 DIAGNOSIS — R6889 Other general symptoms and signs: Secondary | ICD-10-CM | POA: Diagnosis not present

## 2018-03-09 DIAGNOSIS — M6281 Muscle weakness (generalized): Secondary | ICD-10-CM | POA: Diagnosis not present

## 2018-03-09 NOTE — Therapy (Signed)
[place], [name], [address] Phone: [phone]   Fax:  [phone]  Physical Therapy Treatment  Patient Details  Name: Adrian Rios MRN: [id number] Date of Birth: 15-Jun-1940 Referring Provider (PT): Billey Gosling   Encounter Date: 03/09/2018  PT End of Session - 03/09/18 1050    Visit Number  5    Date for PT Re-Evaluation  04/12/18    PT Start Time  1012    PT Stop Time  1054    PT Time Calculation (min)  42 min    Activity Tolerance  Patient tolerated treatment well    Behavior During Therapy  [facility] for tasks assessed/performed       Past Medical History:  Diagnosis Date  . Allergic rhinitis   . Arthritis   . Benign localized prostatic hyperplasia with lower urinary tract symptoms (LUTS)   . Bladder tumor   . Borderline glaucoma of right eye   . Cancer [facility])    bladder and prostate  . Carotid bruit    per duplex 03-11-2016 RICA 1-39%  . Chronic throat clearing   . DDD (degenerative disc disease), lumbar   . Depression   . ED (erectile dysfunction)   . Elevated PSA    urologist-  dr [name]--- s/p  prostate bx's  . GERD (gastroesophageal reflux disease)   . Hematuria   . History of chronic bronchitis   . 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 History of squamous cell carcinoma excision    several times  . Hyperlipidemia   . Pre-diabetes   . Premature ventricular contractions (PVCs) (VPCs)   . RAD (reactive airway disease)   . 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 Sensorineural hearing loss (SNHL) of both ears   . Sepsis ([place]) 09/2016  . UTI (urinary tract infection) 08/2016  . Wears glasses   . Wears hearing aid    bilateral    Past Surgical History:  Procedure Laterality Date  . APPENDECTOMY  2008  . CARDIOVASCULAR STRESS TEST  05/27/2010   normal nuclear  study w/ no ischemia/  normal LV function and wall motion , ef 60%  . CYSTOSCOPY WITH INJECTION N/A 06/12/2016   Procedure: CYSTOSCOPY WITH INJECTION OF INDOCYANINE GREEN DYE;  Surgeon: Alexis Frock, MD;  Location: [facility];  Service: Urology;  Laterality: N/A;  . DEEP NECK LYMPH NODE BIOPSY / EXCISION  1970's   and Bronchoscopy w/ bx's ( dx Sarcoidosis)  . ILEOSTOMY    . IR NEPHROSTOMY PLACEMENT LEFT  11/18/2016  . MOHS SURGERY  2013 approx.    nasal; pre melanoma  . PARS PLANA VITRECTOMY Right 2007   repair macular pucker  . TONSILLECTOMY AND ADENOIDECTOMY  child  . TRANSURETHRAL RESECTION OF BLADDER TUMOR N/A 04/06/2016   Procedure: CYSTOSCOPY TRANSURETHRAL RESECTION OF BLADDER TUMORS (TURBT);  Surgeon: Carolan Clines, MD;  Location: [facility];  Service: Urology;  Laterality: N/A;  . TRANSURETHRAL RESECTION OF PROSTATE      There were no vitals filed for this visit.  Subjective Assessment - 03/09/18 1015    Subjective  "I am good"    Currently in Pain?  No/denies  [place] Adult PT Treatment/Exercise - 03/09/18 0001      Knee/Hip Exercises: Aerobic   Elliptical  I10 R 5 41mn fwd, 2 min bkwd    Recumbent Bike  L1 x4 min     Nustep  L5 X 5 min UE/LE    Other Aerobic  UBE 3 2 min each wayL      Knee/Hip Exercises: Machines for Strengthening   Cybex Knee Extension  15# 3X10    Cybex Knee Flexion  35# 2X12    Cybex Leg Press  40lb 2x10     Other Machine  seated rows, lats 25lb 3x10 each      Knee/Hip Exercises: Standing   Other Standing Knee Exercises  up/down 2 flights of stairs in stairwell               PT Short Term Goals - 02/22/18 1053      PT SHORT TERM GOAL #1   Title  issue HEP    Status  Achieved        PT Long Term Goals - 03/09/18 1046      PT LONG TERM GOAL #1   Title  independent with an advanced HEP    Status  Achieved      PT LONG TERM GOAL #2   Title  report no difficulty going up or  down stairs    Status  Partially Met      PT LONG TERM GOAL #3   Title  report 50% fewer days where he "feels bad"    Status  Achieved      PT LONG TERM GOAL #4   Title  report that he can blow leaves or do light yardwork without feeling "wiped out"    Status  Partially Met            Plan - 03/09/18 1050    Clinical Impression Statement  Pt reporting increase functional endurance at home. He does well with all activity just fatigues more with aerobic interventions. LE fatigue reported on elliptical. One standing rest break with stair negotiation. Tolerated increase weight with some strengthening interventions.     PT Frequency  2x / week    PT Duration  8 weeks    PT Next Visit Plan  continue progressive exercises to build tolerance/endurance.         Patient will benefit from skilled therapeutic intervention in order to improve the following deficits and impairments:  Decreased range of motion, Difficulty walking, Cardiopulmonary status limiting activity, Decreased activity tolerance, Impaired flexibility, Decreased strength, Decreased mobility  Visit Diagnosis: Decreased activity tolerance  Muscle weakness (generalized)     Problem List Patient Active Problem List   Diagnosis Date Noted  . Physical deconditioning 01/24/2018  . Gait disorder 01/24/2018  . Left hip pain 11/11/2017  . Abdominal wall hernia 05/26/2017  . Recurrent urinary tract infection 11/13/2016  . Sepsis ([place] 11/13/2016  . Chronic diastolic CHF (congestive heart failure) ([place] 11/12/2016  . Fatigue 08/19/2016  . History of bladder cancer 06/18/2016  . History of prostate cancer 06/18/2016  . 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[place] 06/12/2016  . Carotid bruit 02/17/2016  . Prediabetes 08/20/2015  . Overweight (BMI 25.0-29.9) 08/20/2015  . Esophageal reflux 08/20/2015  . Glaucoma 02/14/2015  . Varicose veins of lower extremities with other complications 029/51/8841 . RAD (reactive airway disease) 10/27/2011   . 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[place], 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  . 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 ERECTILE DYSFUNCTION 04/23/2010  . DISC DISEASE,  LUMBOSACRAL SPINE 02/06/2010  . 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 Depression 04/12/2008  . Allergic rhinitis 04/12/2008  . Hyperlipidemia 10/05/2006  . Sarcoidosis ([place]) 07/05/2006    Scot Jun, PTA 03/09/2018, 10:57 AM  [place], [name], [address] Phone: [phone]   Fax:  [phone]  Name: Adrian Rios MRN: [id number] Date of Birth: 1941/01/05

## 2018-03-16 ENCOUNTER — Ambulatory Visit: Payer: Medicare Other | Admitting: Physical Therapy

## 2018-03-18 ENCOUNTER — Encounter: Payer: Self-pay | Admitting: Physical Therapy

## 2018-03-23 ENCOUNTER — Encounter: Payer: Self-pay | Admitting: Physical Therapy

## 2018-03-23 ENCOUNTER — Ambulatory Visit: Payer: Medicare Other | Admitting: Physical Therapy

## 2018-03-23 DIAGNOSIS — M6281 Muscle weakness (generalized): Secondary | ICD-10-CM | POA: Diagnosis not present

## 2018-03-23 DIAGNOSIS — R6889 Other general symptoms and signs: Secondary | ICD-10-CM | POA: Diagnosis not present

## 2018-03-23 NOTE — Therapy (Signed)
[place], [name], [address] Phone: [phone]   Fax:  [phone]  Physical Therapy Treatment  Patient Details  Name: CORION SHERROD MRN: [id number] Date of Birth: 1940/08/25 Referring Provider (PT): Billey Gosling   Encounter Date: 03/23/2018  PT End of Session - 03/23/18 1050    Visit Number  6    Date for PT Re-Evaluation  04/12/18    PT Start Time  1005    PT Stop Time  1050    PT Time Calculation (min)  45 min    Activity Tolerance  Patient tolerated treatment well    Behavior During Therapy  [facility] for tasks assessed/performed       Past Medical History:  Diagnosis Date  . Allergic rhinitis   . Arthritis   . Benign localized prostatic hyperplasia with lower urinary tract symptoms (LUTS)   . Bladder tumor   . Borderline glaucoma of right eye   . Cancer [facility])    bladder and prostate  . Carotid bruit    per duplex 03-11-2016 RICA 1-39%  . Chronic throat clearing   . DDD (degenerative disc disease), lumbar   . Depression   . ED (erectile dysfunction)   . Elevated PSA    urologist-  dr [name]--- s/p  prostate bx's  . GERD (gastroesophageal reflux disease)   . Hematuria   . History of chronic bronchitis   . 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 History of squamous cell carcinoma excision    several times  . Hyperlipidemia   . Pre-diabetes   . Premature ventricular contractions (PVCs) (VPCs)   . RAD (reactive airway disease)   . 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 Sensorineural hearing loss (SNHL) of both ears   . Sepsis ([place]) 09/2016  . UTI (urinary tract infection) 08/2016  . Wears glasses   . Wears hearing aid    bilateral    Past Surgical History:  Procedure Laterality Date  . APPENDECTOMY  2008  . CARDIOVASCULAR STRESS TEST  05/27/2010   normal nuclear  study w/ no ischemia/  normal LV function and wall motion , ef 60%  . CYSTOSCOPY WITH INJECTION N/A 06/12/2016   Procedure: CYSTOSCOPY WITH INJECTION OF INDOCYANINE GREEN DYE;  Surgeon: Alexis Frock, MD;  [facility];  Service: Urology;  Laterality: N/A;  . DEEP NECK LYMPH NODE BIOPSY / EXCISION  1970's   and Bronchoscopy w/ bx's ( dx Sarcoidosis)  . ILEOSTOMY    . IR NEPHROSTOMY PLACEMENT LEFT  11/18/2016  . MOHS SURGERY  2013 approx.    nasal; pre melanoma  . PARS PLANA VITRECTOMY Right 2007   repair macular pucker  . TONSILLECTOMY AND ADENOIDECTOMY  child  . TRANSURETHRAL RESECTION OF BLADDER TUMOR N/A 04/06/2016   Procedure: CYSTOSCOPY TRANSURETHRAL RESECTION OF BLADDER TUMORS (TURBT);  Surgeon: Carolan Clines, MD;  Location: [facility];  Service: Urology;  Laterality: N/A;  . TRANSURETHRAL RESECTION OF PROSTATE      There were no vitals filed for this visit.  Subjective Assessment - 03/23/18 1007    Subjective  Patient reports fatigue walking the dog.  Reports endurance is his biggest issue right now    Currently in Pain?  No/denies  [place] Adult PT Treatment/Exercise - 03/23/18 0001      Ambulation/Gait   Gait Comments  walking with 4# in each hand alternating biceps and then alternating shoulder press >400 feet      Knee/Hip Exercises: Aerobic   Elliptical  I10 R 5 59mn fwd, 2 min bkwd    Nustep  L5 X 6 min UE/LE    Other Aerobic  UBE constant work 30 watts, HR after was 130 bpm, after 2 minute rest HR 94 bpm      Knee/Hip Exercises: Machines for Strengthening   Cybex Leg Press  20# single legs 2x10 each      Knee/Hip Exercises: Standing   Walking with Sports Cord  all directions      Knee/Hip Exercises: Seated   Sit to Sand  20 reps;without UE support;Other (comment)   weighted ball press with stand and then overhead stand              PT Short Term Goals - 02/22/18 1053      PT SHORT TERM GOAL  #1   Title  issue HEP    Status  Achieved        PT Long Term Goals - 03/23/18 1054      PT LONG TERM GOAL #2   Title  report no difficulty going up or down stairs    Status  Partially Met      PT LONG TERM GOAL #3   Title  report 50% fewer days where he "feels bad"    Status  Achieved      PT LONG TERM GOAL #4   Title  report that he can blow leaves or do light yardwork without feeling "wiped out"    Status  Partially Met            Plan - 03/23/18 1051    Clinical Impression Statement  Treatment focus today was endurance and multi joint/mm exercises, he did well but did fatigue.  His HR on the UBE went up to 130 bpm.  Able to recover in about 3 minutes.  Reports that his biggest issue is fatigue with walking the dog, would like to hunt and fish with son in the spring/summer if he can    PT Next Visit Plan  continue to work on the strength, endurance and function    Consulted and Agree with Plan of Care  Patient       Patient will benefit from skilled therapeutic intervention in order to improve the following deficits and impairments:  Decreased range of motion, Difficulty walking, Cardiopulmonary status limiting activity, Decreased activity tolerance, Impaired flexibility, Decreased strength, Decreased mobility  Visit Diagnosis: Decreased activity tolerance  Muscle weakness (generalized)     Problem List Patient Active Problem List   Diagnosis Date Noted  . Physical deconditioning 01/24/2018  . Gait disorder 01/24/2018  . Left hip pain 11/11/2017  . Abdominal wall hernia 05/26/2017  . Recurrent urinary tract infection 11/13/2016  . Sepsis ([place] 11/13/2016  . Chronic diastolic CHF (congestive heart failure) ([place] 11/12/2016  . Fatigue 08/19/2016  . History of bladder cancer 06/18/2016  . History of prostate cancer 06/18/2016  . 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[place] 06/12/2016  . Carotid bruit 02/17/2016  . Prediabetes 08/20/2015  . Overweight (BMI 25.0-29.9) 08/20/2015   . Esophageal reflux 08/20/2015  . Glaucoma 02/14/2015  . Varicose veins of lower extremities with other complications 009/23/3007 . RAD (reactive airway disease) 10/27/2011  . 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[place], 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  .  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 ERECTILE DYSFUNCTION 04/23/2010  . [place] DISEASE, LUMBOSACRAL SPINE 02/06/2010  . 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 Depression 04/12/2008  . Allergic rhinitis 04/12/2008  . Hyperlipidemia 10/05/2006  . Sarcoidosis ([place]) 07/05/2006    Sumner Boast., PT 03/23/2018, 10:54 AM  [place], [name], [address] Phone: [phone]   Fax:  [phone]  Name: AYDYN TESTERMAN MRN: [id number] Date of Birth: Jun 17, 1940

## 2018-03-28 ENCOUNTER — Ambulatory Visit: Payer: Medicare Other | Admitting: Physical Therapy

## 2018-03-28 ENCOUNTER — Encounter: Payer: Self-pay | Admitting: Physical Therapy

## 2018-03-28 DIAGNOSIS — M6281 Muscle weakness (generalized): Secondary | ICD-10-CM | POA: Diagnosis not present

## 2018-03-28 DIAGNOSIS — R6889 Other general symptoms and signs: Secondary | ICD-10-CM | POA: Diagnosis not present

## 2018-03-28 NOTE — Therapy (Signed)
[place], [name], [address] Phone: [phone]   Fax:  [phone]  Physical Therapy Treatment  Patient Details  Name: Adrian Rios MRN: [id number] Date of Birth: 1940/03/22 Referring Provider (PT): Billey Gosling   Encounter Date: 03/28/2018  PT End of Session - 03/28/18 1340    Visit Number  7    Date for PT Re-Evaluation  04/12/18    PT Start Time  1300    PT Stop Time  1341    PT Time Calculation (min)  41 min    Activity Tolerance  Patient tolerated treatment well    Behavior During Therapy  [facility] for tasks assessed/performed       Past Medical History:  Diagnosis Date  . Allergic rhinitis   . Arthritis   . Benign localized prostatic hyperplasia with lower urinary tract symptoms (LUTS)   . Bladder tumor   . Borderline glaucoma of right eye   . Cancer [facility])    bladder and prostate  . Carotid bruit    per duplex 03-11-2016 RICA 1-39%  . Chronic throat clearing   . DDD (degenerative disc disease), lumbar   . Depression   . ED (erectile dysfunction)   . Elevated PSA    urologist-  dr [name]--- s/p  prostate bx's  . GERD (gastroesophageal reflux disease)   . Hematuria   . History of chronic bronchitis   . 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 History of squamous cell carcinoma excision    several times  . Hyperlipidemia   . Pre-diabetes   . Premature ventricular contractions (PVCs) (VPCs)   . RAD (reactive airway disease)   . 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 Sensorineural hearing loss (SNHL) of both ears   . Sepsis ([place]) 09/2016  . UTI (urinary tract infection) 08/2016  . Wears glasses   . Wears hearing aid    bilateral    Past Surgical History:  Procedure Laterality Date  . APPENDECTOMY  2008  . CARDIOVASCULAR STRESS TEST  05/27/2010   normal nuclear  study w/ no ischemia/  normal LV function and wall motion , ef 60%  . CYSTOSCOPY WITH INJECTION N/A 06/12/2016   Procedure: CYSTOSCOPY WITH INJECTION OF INDOCYANINE GREEN DYE;  Surgeon: Alexis Frock, MD;  [facility];  Service: Urology;  Laterality: N/A;  . DEEP NECK LYMPH NODE BIOPSY / EXCISION  1970's   and Bronchoscopy w/ bx's ( dx Sarcoidosis)  . ILEOSTOMY    . IR NEPHROSTOMY PLACEMENT LEFT  11/18/2016  . MOHS SURGERY  2013 approx.    nasal; pre melanoma  . PARS PLANA VITRECTOMY Right 2007   repair macular pucker  . TONSILLECTOMY AND ADENOIDECTOMY  child  . TRANSURETHRAL RESECTION OF BLADDER TUMOR N/A 04/06/2016   Procedure: CYSTOSCOPY TRANSURETHRAL RESECTION OF BLADDER TUMORS (TURBT);  Surgeon: Carolan Clines, MD;  Location: [facility];  Service: Urology;  Laterality: N/A;  . TRANSURETHRAL RESECTION OF PROSTATE      There were no vitals filed for this visit.  Subjective Assessment - 03/28/18 1301    Subjective  "Fair" Pt reports that is has not been good day, "I have done too much yesterday"    Currently in Pain?  No/denies    Pain Score  0-No pain  [place] Adult PT Treatment/Exercise - 03/28/18 0001      Ambulation/Gait   Gait Comments  walking with 4# in each hand alternating biceps and then alternating shoulder press >400 feet      Knee/Hip Exercises: Aerobic   Elliptical  I10 R 5 90mn fwd, 2 min bkwd    Nustep  L5 X 6 min UE/LE    Other Aerobic  UBE constant work 30 watts, HR after was 115 bpm, after 2 minute rest HR 93 bpm      Knee/Hip Exercises: Machines for Strengthening   Cybex Leg Press  50lb 2x10     Other Machine  seated rows, lats 25lb 3x10 each      Knee/Hip Exercises: Standing   Other Standing Knee Exercises  up/down 2 flights of stairs in stairwell      Knee/Hip Exercises: Seated   Sit to Sand  20 reps;without UE support;Other (comment)   OHP with blue ball               PT Short  Term Goals - 02/22/18 1053      PT SHORT TERM GOAL #1   Title  issue HEP    Status  Achieved        PT Long Term Goals - 03/28/18 1340      PT LONG TERM GOAL #2   Title  report no difficulty going up or down stairs    Status  Partially Met      PT LONG TERM GOAL #3   Title  report 50% fewer days where he "feels bad"    Status  Achieved      PT LONG TERM GOAL #4   Title  report that he can blow leaves or do light yardwork without feeling "wiped out"    Status  Partially Met            Plan - 03/28/18 1341    Clinical Impression Statement  Continues with multi joint/mm exercises. HR did not rise as high on UBE constant work, but did require ~ 2 minutes to recover. Cues to keep shoulder back with walking OHP. Two flights of stairs without rest, alternating pattern one rail on the R .    Rehab Potential  Good    PT Frequency  2x / week    PT Treatment/Interventions  ADLs/Self Care Home Management;Gait training;Balance training;Therapeutic exercise;Therapeutic activities;Functional mobility training;Stair training;Patient/family education;Manual techniques    PT Next Visit Plan  continue to work on the strength, endurance and function       Patient will benefit from skilled therapeutic intervention in order to improve the following deficits and impairments:  Decreased range of motion, Difficulty walking, Cardiopulmonary status limiting activity, Decreased activity tolerance, Impaired flexibility, Decreased strength, Decreased mobility  Visit Diagnosis: Muscle weakness (generalized)  Decreased activity tolerance     Problem List Patient Active Problem List   Diagnosis Date Noted  . Physical deconditioning 01/24/2018  . Gait disorder 01/24/2018  . Left hip pain 11/11/2017  . Abdominal wall hernia 05/26/2017  . Recurrent urinary tract infection 11/13/2016  . Sepsis ([place] 11/13/2016  . Chronic diastolic CHF (congestive heart failure) ([place] 11/12/2016  . Fatigue  08/19/2016  . History of bladder cancer 06/18/2016  . History of prostate cancer 06/18/2016  . 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[place] 06/12/2016  . Carotid bruit 02/17/2016  . Prediabetes 08/20/2015  . Overweight (BMI 25.0-29.9) 08/20/2015  . Esophageal reflux 08/20/2015  . Glaucoma 02/14/2015  . Varicose veins of lower  extremities with other complications 70/17/7939  . RAD (reactive airway disease) 10/27/2011  . 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 ERECTILE DYSFUNCTION 04/23/2010  . [place] DISEASE, LUMBOSACRAL SPINE 02/06/2010  . 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 Depression 04/12/2008  . Allergic rhinitis 04/12/2008  . Hyperlipidemia 10/05/2006  . Sarcoidosis ([place]) 07/05/2006    Scot Jun, PTA 03/28/2018, 1:44 PM  [place], [name], [address] Phone: [phone]   Fax:  [phone]  Name: JARION HAWTHORNE MRN: [id number] Date of Birth: [birth year]

## 2018-04-04 ENCOUNTER — Other Ambulatory Visit: Payer: Self-pay | Admitting: Internal Medicine

## 2018-04-04 ENCOUNTER — Ambulatory Visit: Payer: Medicare Other | Attending: Internal Medicine | Admitting: Physical Therapy

## 2018-04-04 DIAGNOSIS — M6281 Muscle weakness (generalized): Secondary | ICD-10-CM | POA: Diagnosis not present

## 2018-04-04 DIAGNOSIS — R6889 Other general symptoms and signs: Secondary | ICD-10-CM | POA: Diagnosis not present

## 2018-04-04 NOTE — Therapy (Signed)
[facility] [address] [place], [place], [address] Phone: [phone]   Fax:  [phone]  Physical Therapy Treatment  Patient Details  Name: Adrian Rios MRN: [id number] Date of Birth: 10/26/1940 Referring Provider (PT): Stacy Burns   Encounter Date: 04/04/2018  PT End of Session - 04/04/18 1014    Visit Number  8    Date for PT Re-Evaluation  04/12/18    PT Start Time  1014    PT Stop Time  1059    PT Time Calculation (min)  45 min    Activity Tolerance  Patient tolerated treatment well    Behavior During Therapy  WFL for tasks assessed/performed       Past Medical History:  Diagnosis Date  . Allergic rhinitis   . Arthritis   . Benign localized prostatic hyperplasia with lower urinary tract symptoms (LUTS)   . Bladder tumor   . Borderline glaucoma of right eye   . Cancer (HCC)    bladder and prostate  . Carotid bruit    per duplex 03-11-2016 RICA 1-39%  . Chronic throat clearing   . DDD (degenerative disc disease), lumbar   . Depression   . ED (erectile dysfunction)   . Elevated PSA    urologist-  dr tannenbaum--- s/p  prostate bx's  . GERD (gastroesophageal reflux disease)   . Hematuria   . History of chronic bronchitis   . 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 History of squamous cell carcinoma excision    several times  . Hyperlipidemia   . Pre-diabetes   . Premature ventricular contractions (PVCs) (VPCs)   . RAD (reactive airway disease)   . 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 Sensorineural hearing loss (SNHL) of both ears   . Sepsis (HCC) 09/2016  . UTI (urinary tract infection) 08/2016  . Wears glasses   . Wears hearing aid    bilateral    Past Surgical History:  Procedure Laterality Date  . APPENDECTOMY  2008  . CARDIOVASCULAR STRESS TEST  05/27/2010   normal nuclear  study w/ no ischemia/  normal LV function and wall motion , ef 60%  . CYSTOSCOPY WITH INJECTION N/A 06/12/2016   Procedure: CYSTOSCOPY WITH INJECTION OF INDOCYANINE GREEN DYE;  Surgeon: Theodore Manny, MD;  Location: [facility];  Service: Urology;  Laterality: N/A;  . DEEP NECK LYMPH NODE BIOPSY / EXCISION  1970's   and Bronchoscopy w/ bx's ( dx Sarcoidosis)  . ILEOSTOMY    . IR NEPHROSTOMY PLACEMENT LEFT  11/18/2016  . MOHS SURGERY  2013 approx.    nasal; pre melanoma  . PARS PLANA VITRECTOMY Right 2007   repair macular pucker  . TONSILLECTOMY AND ADENOIDECTOMY  child  . TRANSURETHRAL RESECTION OF BLADDER TUMOR N/A 04/06/2016   Procedure: CYSTOSCOPY TRANSURETHRAL RESECTION OF BLADDER TUMORS (TURBT);  Surgeon: Sigmund Tannenbaum, MD;  Location: [facility];  Service: Urology;  Laterality: N/A;  . TRANSURETHRAL RESECTION OF PROSTATE      There were no vitals filed for this visit.  Subjective Assessment - 04/04/18 1017    Subjective  Patient reports improvement overall.     Patient Stated Goals  be stronger    Currently in Pain?  No/denies                         [facility] Adult PT Treatment/Exercise - 04/04/18 0001      Ambulation/Gait   Gait Comments  walking with 4# in each hand alternating biceps and then alternating shoulder press >400 feet      Knee/Hip Exercises: Aerobic   Elliptical  I10 R 5 2min fwd, 2 min bkwd, 2 min fwd    Nustep  L5 X 6 min UE/LE    Other Aerobic  UBE constant work 30 watts x 4 min, HR after was 115 bpm, after 2 minute rest HR 93 bpm   114 BPM after 2 min 94 bpm     Knee/Hip Exercises: Machines for Strengthening   Cybex Leg Press  50lb 2x10     Other Machine  seated rows, lats 25lb 3x10 each      Knee/Hip Exercises: Standing   Other Standing Knee Exercises  up/down 2 flights of stairs in stairwell      Knee/Hip Exercises: Seated   Sit to Sand  20 reps;without UE support;Other (comment)   OHP with blue ball; one LOB               PT Short Term Goals - 02/22/18 1053      PT SHORT TERM GOAL #1   Title  issue HEP    Status  Achieved        PT Long Term Goals - 04/04/18 1058      PT LONG TERM GOAL #1   Title  independent with an advanced HEP    Time  8    Period  Weeks    Status  Achieved      PT LONG TERM GOAL #2   Title  report no difficulty going up or down stairs    Period  Weeks    Status  Achieved      PT LONG TERM GOAL #3   Title  report 50% fewer days where he "feels bad"    Time  8    Period  Weeks    Status  Achieved      PT LONG TERM GOAL #4   Title  report that he can blow leaves or do light yardwork without feeling "wiped out"    Period  Weeks    Status  Partially Met            Plan - 04/04/18 1150    Clinical Impression Statement  Patient continues to improve meeting 3/4 LTGs. He is toleraterating increased exercises well and feels he is ready to discharge after one more visit.     Rehab Potential  Good    PT Frequency  2x / week    PT Duration  8 weeks    PT Treatment/Interventions  ADLs/Self Care Home Management;Gait training;Balance training;Therapeutic exercise;Therapeutic activities;Functional mobility training;Stair training;Patient/family education;Manual techniques    PT Next Visit Plan  D/C to HEP    Consulted and Agree with Plan of Care  Patient       Patient will benefit from skilled therapeutic intervention in order to improve the following deficits and impairments:  Decreased range of motion, Difficulty walking, Cardiopulmonary status limiting activity, Decreased activity tolerance, Impaired flexibility, Decreased strength, Decreased mobility  Visit Diagnosis: Muscle weakness (generalized)  Decreased activity tolerance     Problem List Patient Active Problem List   Diagnosis Date Noted  . Physical deconditioning 01/24/2018  . Gait disorder 01/24/2018  . Left hip pain 11/11/2017  . Abdominal wall hernia 05/26/2017  . Recurrent  urinary tract infection 11/13/2016  .   Sepsis (HCC) 11/13/2016  . Chronic diastolic CHF (congestive heart failure) (HCC) 11/12/2016  . Fatigue 08/19/2016  . History of bladder cancer 06/18/2016  . History of prostate cancer 06/18/2016  . 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HCC) 06/12/2016  . Carotid bruit 02/17/2016  . Prediabetes 08/20/2015  . Overweight (BMI 25.0-29.9) 08/20/2015  . Esophageal reflux 08/20/2015  . Glaucoma 02/14/2015  . Varicose veins of lower extremities with other complications 03/20/2013  . RAD (reactive airway disease) 10/27/2011  . 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HCC), 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  . 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 ERECTILE DYSFUNCTION 04/23/2010  . DISC DISEASE, LUMBOSACRAL SPINE 02/06/2010  . 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 Depression 04/12/2008  . Allergic rhinitis 04/12/2008  . Hyperlipidemia 10/05/2006  . Sarcoidosis (HCC) 07/05/2006    Julie Riddles PT 04/04/2018, 11:53 AM  [facility] [address] [place], [place], [address] Phone: [phone]   Fax:  [phone]  Name: Abu R Novell MRN: [id number] Date of Birth: 01/27/1941   

## 2018-04-08 ENCOUNTER — Telehealth: Payer: Self-pay | Admitting: Internal Medicine

## 2018-04-08 ENCOUNTER — Encounter: Payer: Self-pay | Admitting: Internal Medicine

## 2018-04-08 ENCOUNTER — Ambulatory Visit (INDEPENDENT_AMBULATORY_CARE_PROVIDER_SITE_OTHER): Payer: Medicare Other | Admitting: Internal Medicine

## 2018-04-08 VITALS — BP 102/60 | HR 75 | Temp 97.7°F | Resp 16 | Ht 67.0 in | Wt 171.0 lb

## 2018-04-08 DIAGNOSIS — L03311 Cellulitis of abdominal wall: Secondary | ICD-10-CM

## 2018-04-08 MED ORDER — DOXYCYCLINE HYCLATE 100 MG PO TABS: 100.0000 mg | ORAL_TABLET | Freq: Two times a day (BID) | ORAL | 0 refills | Status: DC

## 2018-04-08 MED ORDER — MUPIROCIN CALCIUM 2 % EX CREA: 1.0000 "application " | TOPICAL_CREAM | Freq: Two times a day (BID) | CUTANEOUS | 0 refills | Status: DC

## 2018-04-08 NOTE — Telephone Encounter (Signed)
Copied from [place] [phone]. Topic: Quick Communication - See Telephone Encounter >> Apr 08, 2018  4:19 PM Blase Mess A wrote: CRM for notification. See Telephone encounter for: 04/08/18.  Olivia Calling from [place] regarding  mupirocin cream (BACTROBAN) 2 % [[id number]] is not covered by the patient's insurance.Can the script be changed to the otiment that is covered by insurance. Please advise [phone]

## 2018-04-08 NOTE — Assessment & Plan Note (Signed)
Mild cellulitis of left lower abdominal wall with small wound that is draining minimal amounts of serosanguineous fluid-no obvious pus. I do not think there is an abscess present He has mild pain We will try anabiotic ointment topically twice daily Prescription for doxycycline given and he will keep a close eye on this over the next 1-2 days-if area gets worse he will start the doxycycline If area seems to be getting better he will hold off on any oral antibiotic Call with any questions or concerns

## 2018-04-08 NOTE — Patient Instructions (Signed)
Use the topical antibiotic cream twice a day.     If the infection gets worse or does not get better over the next 1-2 days start the oral antibiotic.

## 2018-04-08 NOTE — Progress Notes (Signed)
Subjective:    Patient ID: Adrian Rios, male    DOB: Feb 18, 1941, 78 y.o.   MRN: [id number]  HPI The patient is here for an acute visit.  Red spot on lower abdomen: He has had a lump on the left side of his lower abdomen for about a month.  He had an abdominal hernia repair a while ago and has several lumps in different places.  The past 2-3 days this 1 lump turned red and today he noticed that it was oozing.  He denies any pus, but states it was more blood that it was oozing.  There is a small wound that is oozing and surrounding erythema.  The area is tender when he touches it, but if he leaves it alone it is not tender.  He denies any fevers or chills.     Medications and allergies reviewed with patient and updated if appropriate.  Patient Active Problem List   Diagnosis Date Noted  . Physical deconditioning 01/24/2018  . Gait disorder 01/24/2018  . Left hip pain 11/11/2017  . Abdominal wall hernia 05/26/2017  . Recurrent urinary tract infection 11/13/2016  . Sepsis ([place]) 11/13/2016  . Chronic diastolic CHF (congestive heart failure) ([place]) 11/12/2016  . Fatigue 08/19/2016  . History of bladder cancer 06/18/2016  . History of prostate cancer 06/18/2016  . 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[place]) 06/12/2016  . Carotid bruit 02/17/2016  . Prediabetes 08/20/2015  . Overweight (BMI 25.0-29.9) 08/20/2015  . Esophageal reflux 08/20/2015  . Glaucoma 02/14/2015  . Varicose veins of lower extremities with other complications 47/10/6281  . RAD (reactive airway disease) 10/27/2011  . 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[place]), 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  . 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 ERECTILE DYSFUNCTION 04/23/2010  . [place] DISEASE, LUMBOSACRAL SPINE 02/06/2010  . 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 Depression 04/12/2008  . Allergic rhinitis 04/12/2008  . Hyperlipidemia 10/05/2006  . Sarcoidosis ([place]) 07/05/2006    Current Outpatient Medications on File Prior to Visit  Medication Sig Dispense  Refill  . ADVAIR DISKUS 250-50 MCG/DOSE AEPB INHALE 1 DOSE BY MOUTH EVERY 12 HOURS 60 each 1  . magnesium oxide (MAG-OX) 400 MG tablet Take 400 mg by mouth daily.    . Misc Natural Products (GLUCOSAMINE CHOND DOUBLE STR PO) Take 1 tablet by mouth 2 (two) times daily.    . montelukast (SINGULAIR) 10 MG tablet TAKE ONE TABLET BY MOUTH DAILY 90 tablet 0  . ranitidine (ZANTAC) 150 MG tablet TAKE 1 TABLET BY MOUTH TWICE DAILY 180 tablet 1  . tiZANidine (ZANAFLEX) 2 MG tablet Take 1 tablet (2 mg total) by mouth every 6 (six) hours as needed for muscle spasms. 40 tablet 1  . triamcinolone (NASACORT ALLERGY 24HR) 55 MCG/ACT AERO nasal inhaler Place 1 spray into the nose at bedtime.    Marland Kitchen venlafaxine (EFFEXOR) 75 MG tablet TAKE ONE TABLET BY MOUTH DAILY 90 tablet 0   No current facility-administered medications on file prior to visit.     Past Medical History:  Diagnosis Date  . Allergic rhinitis   . Arthritis   . Benign localized prostatic hyperplasia with lower urinary tract symptoms (LUTS)   . Bladder tumor   . Borderline glaucoma of right eye   . Cancer [facility])    bladder and prostate  . Carotid bruit    per duplex 03-11-2016 RICA 1-39%  . Chronic throat clearing   . DDD (degenerative disc disease), lumbar   . Depression   . ED (erectile dysfunction)   .  Elevated PSA    urologist-  dr [name]--- s/p  prostate bx's  . GERD (gastroesophageal reflux disease)   . Hematuria   . History of chronic bronchitis   . 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 History of squamous cell carcinoma excision    several times  . Hyperlipidemia   . Pre-diabetes   . Premature ventricular contractions (PVCs) (VPCs)   . RAD (reactive airway disease)   . 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 Sensorineural hearing loss (SNHL) of both ears   . Sepsis ([place]) 09/2016  . UTI (urinary tract  infection) 08/2016  . Wears glasses   . Wears hearing aid    bilateral    Past Surgical History:  Procedure Laterality Date  . APPENDECTOMY  2008  . CARDIOVASCULAR STRESS TEST  05/27/2010   normal nuclear study w/ no ischemia/  normal LV function and wall motion , ef 60%  . CYSTOSCOPY WITH INJECTION N/A 06/12/2016   Procedure: CYSTOSCOPY WITH INJECTION OF INDOCYANINE GREEN DYE;  Surgeon: Alexis Frock, MD;  Location: [facility];  Service: Urology;  Laterality: N/A;  . DEEP NECK LYMPH NODE BIOPSY / EXCISION  1970's   and Bronchoscopy w/ bx's ( dx Sarcoidosis)  . ILEOSTOMY    . IR NEPHROSTOMY PLACEMENT LEFT  11/18/2016  . MOHS SURGERY  2013 approx.    nasal; pre melanoma  . PARS PLANA VITRECTOMY Right 2007   repair macular pucker  . TONSILLECTOMY AND ADENOIDECTOMY  child  . TRANSURETHRAL RESECTION OF BLADDER TUMOR N/A 04/06/2016   Procedure: CYSTOSCOPY TRANSURETHRAL RESECTION OF BLADDER TUMORS (TURBT);  Surgeon: Carolan Clines, MD;  Location: [facility];  Service: Urology;  Laterality: N/A;  . TRANSURETHRAL RESECTION OF PROSTATE      Social History   Socioeconomic History  . Marital status: Married    Spouse name: Not on file  . Number of children: Not on file  . Years of education: Not on file  . Highest education level: Not on file  Occupational History  . Not on file  Social Needs  . Financial resource strain: Not hard at all  . Food insecurity:    Worry: Never true    Inability: Never true  . Transportation needs:    Medical: No    Non-medical: No  Tobacco Use  . Smoking status: Never Smoker  . Smokeless tobacco: Never Used  Substance and Sexual Activity  . Alcohol use: No  . Drug use: No  . Sexual activity: Not Currently  Lifestyle  . Physical activity:    Days per week: 4 days    Minutes per session: 40 min  . Stress: Only a little  Relationships  . Social connections:    Talks on phone: More than three times a week    Gets together: More  than three times a week    Attends religious service: More than 4 times per year    Active member of club or organization: Not on file    Attends meetings of clubs or organizations: More than 4 times per year    Relationship status: Married  Other Topics Concern  . Not on file  Social History Narrative  . Not on file    Family History  Problem Relation Age of Onset  . Stroke Mother  in her 39s  . Breast cancer Sister   . Heart disease Maternal Grandmother 84       MI   . Breast cancer Paternal Grandmother   . Heart disease Paternal Grandfather 70       MI  . Stroke Brother   . Diabetes Neg Hx     Review of Systems  Constitutional: Negative for chills and fever.  Skin: Positive for color change and wound.  Neurological: Negative for light-headedness and headaches.       Objective:   Vitals:   04/08/18 1106  BP: 102/60  Pulse: 75  Resp: 16  Temp: 97.7 F (36.5 C)  SpO2: 97%   BP Readings from Last 3 Encounters:  04/08/18 102/60  01/24/18 104/68  11/11/17 102/64   Wt Readings from Last 3 Encounters:  04/08/18 171 lb (77.6 kg)  01/24/18 169 lb (76.7 kg)  11/11/17 175 lb (79.4 kg)   Body mass index is 26.78 kg/m.   Physical Exam Constitutional:      General: He is not in acute distress.    Appearance: He is well-developed. He is not ill-appearing or diaphoretic.  Skin:    General: Skin is warm and dry.     Comments: Area of erythema noted half centimeters by 4 cm with central wound or ulcer that has serosanguineous discharge.  No obvious pus.  Minimal tenderness.  No obvious fluctuance or induration directly beneath ulcer.  Neurological:     Mental Status: He is alert.            Assessment & Plan:    See Problem List for Assessment and Plan of chronic medical problems.

## 2018-04-11 NOTE — Telephone Encounter (Signed)
Spoke with pharmacy to have them cancel order. Spoke with patient already on Friday and advised him to use at home antibiotic ointment. Pt understood.

## 2018-04-14 ENCOUNTER — Ambulatory Visit: Payer: Medicare Other | Admitting: Physical Therapy

## 2018-04-19 DIAGNOSIS — K432 Incisional hernia without obstruction or gangrene: Secondary | ICD-10-CM | POA: Diagnosis not present

## 2018-04-19 DIAGNOSIS — Z48815 Encounter for surgical aftercare following surgery on the digestive system: Secondary | ICD-10-CM | POA: Diagnosis not present

## 2018-04-19 DIAGNOSIS — T8189XA Other complications of procedures, not elsewhere classified, initial encounter: Secondary | ICD-10-CM | POA: Diagnosis not present

## 2018-04-19 DIAGNOSIS — L905 Scar conditions and fibrosis of skin: Secondary | ICD-10-CM | POA: Diagnosis not present

## 2018-05-04 DIAGNOSIS — N5232 Erectile dysfunction following radical cystectomy: Secondary | ICD-10-CM | POA: Diagnosis not present

## 2018-05-04 DIAGNOSIS — C61 Malignant neoplasm of prostate: Secondary | ICD-10-CM | POA: Diagnosis not present

## 2018-05-04 DIAGNOSIS — C678 Malignant neoplasm of overlapping sites of bladder: Secondary | ICD-10-CM | POA: Diagnosis not present

## 2018-05-04 DIAGNOSIS — C673 Malignant neoplasm of anterior wall of bladder: Secondary | ICD-10-CM | POA: Diagnosis not present

## 2018-05-23 ENCOUNTER — Other Ambulatory Visit: Payer: Self-pay | Admitting: Internal Medicine

## 2018-06-21 ENCOUNTER — Ambulatory Visit (INDEPENDENT_AMBULATORY_CARE_PROVIDER_SITE_OTHER): Payer: Medicare Other | Admitting: Internal Medicine

## 2018-06-21 ENCOUNTER — Encounter: Payer: Self-pay | Admitting: Internal Medicine

## 2018-06-21 DIAGNOSIS — R42 Dizziness and giddiness: Secondary | ICD-10-CM

## 2018-06-21 MED ORDER — MECLIZINE HCL 12.5 MG PO TABS: 12.5000 mg | ORAL_TABLET | Freq: Three times a day (TID) | ORAL | 0 refills | Status: DC | PRN

## 2018-06-21 NOTE — Progress Notes (Signed)
Virtual Visit via Video Note  I connected with Adrian Rios on 06/21/18 at  3:45 PM EDT by a video enabled telemedicine application and verified that I am speaking with the correct person using two identifiers.   I discussed the limitations of evaluation and management by telemedicine and the availability of in person appointments. The patient expressed understanding and agreed to proceed.  The patient is currently at home and I am in the office.    No referring provider.    History of Present Illness: This is a visit for an acute problem: dizziness.   He woke up this morning with dizziness.  He has had intermittent dizziness all day today.  He can get up and walk, but does not feel comfortable walking long distances because he feels his balance is off a little bit.  He has noticed that the dizziness is worse when he bends over or leans over.  He has had dizziness in the past, but typically it would last for 5 minutes and would not be recurring-it never lasted this long.  He does feel little drowsy today, but otherwise feels fine.  He denies any pain.  He denies nasal congestion, sinus pain, change in hearing, fever, headaches that are new, ear pain or change in hearing.  He has not had any numbness, tingling or weakness in his arms or legs.  He does monitor his blood pressure at home it has been normal.  Both his mother and brother have issues with dizziness.   Social History   Socioeconomic History  . Marital status: Married    Spouse name: Not on file  . Number of children: Not on file  . Years of education: Not on file  . Highest education level: Not on file  Occupational History  . Not on file  Social Needs  . Financial resource strain: Not hard at all  . Food insecurity:    Worry: Never true    Inability: Never true  . Transportation needs:    Medical: No    Non-medical: No  Tobacco Use  . Smoking status: Never Smoker  . Smokeless tobacco: Never Used  Substance and  Sexual Activity  . Alcohol use: No  . Drug use: No  . Sexual activity: Not Currently  Lifestyle  . Physical activity:    Days per week: 4 days    Minutes per session: 40 min  . Stress: Only a little  Relationships  . Social connections:    Talks on phone: More than three times a week    Gets together: More than three times a week    Attends religious service: More than 4 times per year    Active member of club or organization: Not on file    Attends meetings of clubs or organizations: More than 4 times per year    Relationship status: Married  Other Topics Concern  . Not on file  Social History Narrative  . Not on file     Observations/Objective: Appears well in NAD Grossly nonfocal  Assessment and Plan:  See Problem List for Assessment and Plan of chronic medical problems.   Follow Up Instructions:    I discussed the assessment and treatment plan with the patient. The patient was provided an opportunity to ask questions and all were answered. The patient agreed with the plan and demonstrated an understanding of the instructions.   The patient was advised to call back or seek an in-person evaluation if the symptoms worsen  or if the condition fails to improve as anticipated.    [name], MD

## 2018-06-21 NOTE — Assessment & Plan Note (Signed)
Dizziness started today-typically occurs with bending over or when leaning over No concerning neurological symptoms, cold symptoms, ear symptoms or fever Likely BPPV His mother and brother have dizziness and has had similar symptoms Trial of meclizine 12.5-25 mg 3 times a day as needed.  Discussed side effects, especially drowsiness, dry mouth He will call with any questions or concerns or if his symptoms are not improving

## 2018-06-26 ENCOUNTER — Other Ambulatory Visit: Payer: Self-pay | Admitting: Internal Medicine

## 2018-07-05 ENCOUNTER — Other Ambulatory Visit: Payer: Self-pay | Admitting: Internal Medicine

## 2018-08-02 NOTE — Progress Notes (Deleted)
Subjective:   Adrian Rios is a 78 y.o. male who presents for Medicare Annual/Subsequent preventive examination. I connected with patient by a telephone and verified that I am speaking with the correct person using two identifiers. Patient stated full name and DOB. Patient gave permission to continue with telephonic visit. Patient's location was at home and Nurse's location was at [place] office.   Review of Systems:  No ROS.  Medicare Wellness Virtual Visit.  Visual/audio telehealth visit, UTA vital signs.   See social history for additional risk factors.    Sleep patterns: {SX; SLEEP PATTERNS:18802::"feels rested on waking","does not get up to void","gets up *** times nightly to void","sleeps *** hours nightly"}.    Home Safety/Smoke Alarms: Feels safe in home. Smoke alarms in place.  Living environment; residence and Firearm Safety: {Rehab home environment / accessibility:30080::"no firearms","firearms stored safely"}. Seat Belt Safety/Bike Helmet: Wears seat belt.   PSA-  Lab Results  Component Value Date   PSA 0.29 07/11/2009       Objective:    Vitals: There were no vitals taken for this visit.  There is no height or weight on file to calculate BMI.  Advanced Directives 02/09/2018 05/26/2017 11/12/2016 11/11/2016 10/29/2016 10/29/2016 10/26/2016  Does Patient Have a Medical Advance Directive? No Yes No - No Yes Yes  Type of Advance Directive - [place];Living will Living will Living will Living will Living will;Healthcare Power of Attorney Living will;Healthcare Power of Attorney  Does patient want to make changes to medical advance directive? - - No - Patient declined - - - No - Patient declined  Copy of [place] in Chart? - No - copy requested No - copy requested - - No - copy requested No - copy requested  Would patient like information on creating a medical advance directive? No - Patient declined - No - Patient declined - - No -  Patient declined -    Tobacco Social History   Tobacco Use  Smoking Status Never Smoker  Smokeless Tobacco Never Used     Counseling given: Not Answered   Clinical Intake:   Past Medical History:  Diagnosis Date  . Allergic rhinitis   . Arthritis   . Benign localized prostatic hyperplasia with lower urinary tract symptoms (LUTS)   . Bladder tumor   . Borderline glaucoma of right eye   . Cancer [facility])    bladder and prostate  . Carotid bruit    per duplex 03-11-2016 RICA 1-39%  . Chronic throat clearing   . DDD (degenerative disc disease), lumbar   . Depression   . ED (erectile dysfunction)   . Elevated PSA    urologist-  dr [name]--- s/p  prostate bx's  . GERD (gastroesophageal reflux disease)   . Hematuria   . History of chronic bronchitis   . 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 History of squamous cell carcinoma excision    several times  . Hyperlipidemia   . Pre-diabetes   . Premature ventricular contractions (PVCs) (VPCs)   . RAD (reactive airway disease)   . 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 Sensorineural hearing loss (SNHL) of both ears   . Sepsis ([place]) 09/2016  . UTI (urinary tract infection) 08/2016  . Wears glasses   . Wears hearing aid  bilateral   Past Surgical History:  Procedure Laterality Date  . APPENDECTOMY  2008  . CARDIOVASCULAR STRESS TEST  05/27/2010   normal nuclear study w/ no ischemia/  normal LV function and wall motion , ef 60%  . CYSTOSCOPY WITH INJECTION N/A 06/12/2016   Procedure: CYSTOSCOPY WITH INJECTION OF INDOCYANINE GREEN DYE;  Surgeon: Alexis Frock, MD;  Location: [facility];  Service: Urology;  Laterality: N/A;  . DEEP NECK LYMPH NODE BIOPSY / EXCISION  1970's   and Bronchoscopy w/ bx's ( dx Sarcoidosis)  . ILEOSTOMY    . IR NEPHROSTOMY PLACEMENT LEFT  11/18/2016  . MOHS SURGERY  2013 approx.     nasal; pre melanoma  . PARS PLANA VITRECTOMY Right 2007   repair macular pucker  . TONSILLECTOMY AND ADENOIDECTOMY  child  . TRANSURETHRAL RESECTION OF BLADDER TUMOR N/A 04/06/2016   Procedure: CYSTOSCOPY TRANSURETHRAL RESECTION OF BLADDER TUMORS (TURBT);  Surgeon: Carolan Clines, MD;  Location: [facility];  Service: Urology;  Laterality: N/A;  . TRANSURETHRAL RESECTION OF PROSTATE     Family History  Problem Relation Age of Onset  . Stroke Mother        in her 19s  . Breast cancer Sister   . Heart disease Maternal Grandmother 84       MI   . Breast cancer Paternal Grandmother   . Heart disease Paternal Grandfather 93       MI  . Stroke Brother   . Diabetes Neg Hx    Social History   Socioeconomic History  . Marital status: Married    Spouse name: Not on file  . Number of children: Not on file  . Years of education: Not on file  . Highest education level: Not on file  Occupational History  . Not on file  Social Needs  . Financial resource strain: Not hard at all  . Food insecurity:    Worry: Never true    Inability: Never true  . Transportation needs:    Medical: No    Non-medical: No  Tobacco Use  . Smoking status: Never Smoker  . Smokeless tobacco: Never Used  Substance and Sexual Activity  . Alcohol use: No  . Drug use: No  . Sexual activity: Not Currently  Lifestyle  . Physical activity:    Days per week: 4 days    Minutes per session: 40 min  . Stress: Only a little  Relationships  . Social connections:    Talks on phone: More than three times a week    Gets together: More than three times a week    Attends religious service: More than 4 times per year    Active member of club or organization: Not on file    Attends meetings of clubs or organizations: More than 4 times per year    Relationship status: Married  Other Topics Concern  . Not on file  Social History Narrative  . Not on file    Outpatient Encounter Medications as of  08/03/2018  Medication Sig  . ADVAIR DISKUS 250-50 MCG/DOSE AEPB INHALE 1 DOSE BY MOUTH EVERY 12 HOURS  . magnesium oxide (MAG-OX) 400 MG tablet Take 400 mg by mouth daily.  . meclizine (ANTIVERT) 12.5 MG tablet Take 1-2 tablets (12.5-25 mg total) by mouth 3 (three) times daily as needed for dizziness.  . Misc Natural Products (GLUCOSAMINE CHOND DOUBLE STR PO) Take 1 tablet by mouth 2 (two) times daily.  . montelukast (SINGULAIR) 10 MG  tablet TAKE ONE TABLET BY MOUTH DAILY  . mupirocin cream (BACTROBAN) 2 % Apply 1 application topically 2 (two) times daily.  . ranitidine (ZANTAC) 150 MG tablet TAKE 1 TABLET BY MOUTH TWICE DAILY  . tiZANidine (ZANAFLEX) 2 MG tablet Take 1 tablet (2 mg total) by mouth every 6 (six) hours as needed for muscle spasms.  Marland Kitchen triamcinolone (NASACORT ALLERGY 24HR) 55 MCG/ACT AERO nasal inhaler Place 1 spray into the nose at bedtime.  Marland Kitchen venlafaxine (EFFEXOR) 75 MG tablet TAKE ONE TABLET BY MOUTH DAILY   No facility-administered encounter medications on file as of 08/03/2018.     Activities of Daily Living No flowsheet data found.  Patient Care Team: [name], MD as PCP - General (Internal Medicine)   Assessment:   This is a routine wellness examination for [place]. Physical assessment deferred to PCP.  Exercise Activities and Dietary recommendations   Diet (meal preparation, eat out, water intake, caffeinated beverages, dairy products, fruits and vegetables): {Desc; diets:16563}   Goals    . Patient Stated     Continue to walk and eat healthy. Enjoy life and spend as much time as possible with my family and grandchildren.       Fall Risk Fall Risk  05/26/2017 02/04/2017 01/11/2017 08/19/2016 08/20/2015  Falls in the past year? No No No No No    Depression Screen PHQ 2/9 Scores 05/26/2017 02/04/2017 01/11/2017 08/19/2016  PHQ - 2 Score 0 0 0 0  PHQ- 9 Score 2 - - 7    Cognitive Function MMSE - Mini Mental State Exam 05/26/2017  Orientation to time 5   Orientation to Place 5  Registration 3  Attention/ Calculation 5  Recall 1  Language- name 2 objects 2  Language- repeat 1  Language- follow 3 step command 3  Language- read & follow direction 1  Write a sentence 1  Copy design 1  Total score 28        Immunization History  Administered Date(s) Administered  . H1N1 04/12/2008  . Influenza Split 12/16/2010, 01/06/2012  . Influenza Whole 02/03/2007, 12/21/2007, 12/05/2009  . Influenza, High Dose Seasonal PF 12/20/2012, 12/02/2015, 11/11/2017  . Influenza,inj,Quad PF,6+ Mos 12/07/2013  . Influenza-Unspecified 11/26/2017  . Pneumococcal Conjugate-13 08/20/2015  . Pneumococcal Polysaccharide-23 03/09/2002, 12/28/2012  . Tetanus 02/22/2011  . Varicella 07/21/2005  . Zoster 04/25/2006   Screening Tests Health Maintenance  Topic Date Due  . INFLUENZA VACCINE  10/01/2018  . TETANUS/TDAP  02/21/2021  . PNA vac Low Risk Adult  Completed       Plan:     I have personally reviewed and noted the following in the patient's chart:   . Medical and social history . Use of alcohol, tobacco or illicit drugs  . Current medications and supplements . Functional ability and status . Nutritional status . Physical activity . Advanced directives . List of other physicians . Screenings to include cognitive, depression, and falls . Referrals and appointments  In addition, I have reviewed and discussed with patient certain preventive protocols, quality metrics, and best practice recommendations. A written personalized care plan for preventive services as well as general preventive health recommendations were provided to patient.     Michiel Cowboy, RN  08/02/2018

## 2018-08-03 ENCOUNTER — Ambulatory Visit: Payer: Medicare Other

## 2018-08-08 ENCOUNTER — Ambulatory Visit (INDEPENDENT_AMBULATORY_CARE_PROVIDER_SITE_OTHER): Payer: Medicare Other | Admitting: *Deleted

## 2018-08-08 DIAGNOSIS — Z0000 Encounter for general adult medical examination without abnormal findings: Secondary | ICD-10-CM | POA: Diagnosis not present

## 2018-08-08 NOTE — Progress Notes (Addendum)
Subjective:   Adrian Rios is a 78 y.o. male who presents for Medicare Annual/Subsequent preventive examination. I connected with patient by a telephone and verified that I am speaking with the correct person using two identifiers. Patient stated full name and DOB. Patient gave permission to continue with telephonic visit. Patient's location was at home and Nurse's location was at [place] office.   Review of Systems:  No ROS.  Medicare Wellness Virtual Visit.  Visual/audio telehealth visit, UTA vital signs.   See social history for additional risk factors.    Sleep patterns: feels rested on waking and sleeps 7-8 hours nightly.    Home Safety/Smoke Alarms: Feels safe in home. Smoke alarms in place.  Living environment; residence and Firearm Safety: 1-story house/ trailer. Lives with wife, no needs for DME, good support system Seat Belt Safety/Bike Helmet: Wears seat belt.   PSA-  Lab Results  Component Value Date   PSA 0.29 07/11/2009       Objective:    Vitals: There were no vitals taken for this visit.  There is no height or weight on file to calculate BMI.  Advanced Directives 08/08/2018 02/09/2018 05/26/2017 11/12/2016 11/11/2016 10/29/2016 10/29/2016  Does Patient Have a Medical Advance Directive? Yes No Yes No - No Yes  Type of Advance Directive - - [place];Living will Living will Living will Living will Living will;Healthcare Power of Attorney  Does patient want to make changes to medical advance directive? Yes (ED - Information included in AVS) - - No - Patient declined - - -  Copy of [place] in Chart? - - No - copy requested No - copy requested - - No - copy requested  Would patient like information on creating a medical advance directive? - No - Patient declined - No - Patient declined - - No - Patient declined    Tobacco Social History   Tobacco Use  Smoking Status Never Smoker  Smokeless Tobacco Never Used     Counseling  given: Not Answered  Past Medical History:  Diagnosis Date  . Allergic rhinitis   . Arthritis   . Benign localized prostatic hyperplasia with lower urinary tract symptoms (LUTS)   . Bladder tumor   . Borderline glaucoma of right eye   . Cancer [facility])    bladder and prostate  . Carotid bruit    per duplex 03-11-2016 RICA 1-39%  . Chronic throat clearing   . DDD (degenerative disc disease), lumbar   . Depression   . ED (erectile dysfunction)   . Elevated PSA    urologist-  dr [name]--- s/p  prostate bx's  . GERD (gastroesophageal reflux disease)   . Hematuria   . History of chronic bronchitis   . 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 History of squamous cell carcinoma excision    several times  . Hyperlipidemia   . Pre-diabetes   . Premature ventricular contractions (PVCs) (VPCs)   . RAD (reactive airway disease)   . 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 Sensorineural hearing loss (SNHL) of both ears   . Sepsis ([place]) 09/2016  . UTI (urinary tract infection) 08/2016  . Wears glasses   . Wears hearing aid    bilateral   Past Surgical History:  Procedure Laterality Date  . APPENDECTOMY  2008  . CARDIOVASCULAR STRESS TEST  05/27/2010   normal nuclear study w/ no ischemia/  normal LV function and wall motion , ef 60%  . CYSTOSCOPY WITH INJECTION N/A 06/12/2016   Procedure: CYSTOSCOPY WITH INJECTION OF INDOCYANINE GREEN DYE;  Surgeon: Alexis Frock, MD;  [facility];  Service: Urology;  Laterality: N/A;  . DEEP NECK LYMPH NODE BIOPSY / EXCISION  1970's   and Bronchoscopy w/ bx's ( dx Sarcoidosis)  . ILEOSTOMY    . IR NEPHROSTOMY PLACEMENT LEFT  11/18/2016  . MOHS SURGERY  2013 approx.    nasal; pre melanoma  . PARS PLANA VITRECTOMY Right 2007   repair macular pucker  . TONSILLECTOMY AND ADENOIDECTOMY  child  . TRANSURETHRAL RESECTION OF BLADDER TUMOR  N/A 04/06/2016   Procedure: CYSTOSCOPY TRANSURETHRAL RESECTION OF BLADDER TUMORS (TURBT);  Surgeon: Carolan Clines, MD;  Location: [facility];  Service: Urology;  Laterality: N/A;  . TRANSURETHRAL RESECTION OF PROSTATE     Family History  Problem Relation Age of Onset  . Stroke Mother        in her 3s  . Breast cancer Sister   . Heart disease Maternal Grandmother 84       MI   . Breast cancer Paternal Grandmother   . Heart disease Paternal Grandfather 79       MI  . Stroke Brother   . Diabetes Neg Hx    Social History   Socioeconomic History  . Marital status: Married    Spouse name: Not on file  . Number of children: Not on file  . Years of education: Not on file  . Highest education level: Not on file  Occupational History  . Not on file  Social Needs  . Financial resource strain: Not hard at all  . Food insecurity:    Worry: Never true    Inability: Never true  . Transportation needs:    Medical: No    Non-medical: No  Tobacco Use  . Smoking status: Never Smoker  . Smokeless tobacco: Never Used  Substance and Sexual Activity  . Alcohol use: No  . Drug use: No  . Sexual activity: Not Currently  Lifestyle  . Physical activity:    Days per week: 4 days    Minutes per session: 40 min  . Stress: Only a little  Relationships  . Social connections:    Talks on phone: More than three times a week    Gets together: More than three times a week    Attends religious service: More than 4 times per year    Active member of club or organization: Not on file    Attends meetings of clubs or organizations: More than 4 times per year    Relationship status: Married  Other Topics Concern  . Not on file  Social History Narrative  . Not on file    Outpatient Encounter Medications as of 08/08/2018  Medication Sig  . acetaminophen (TYLENOL) 500 MG tablet Take 500 mg by mouth every 6 (six) hours as needed.  Marland Kitchen ADVAIR DISKUS 250-50 MCG/DOSE AEPB INHALE 1 DOSE  BY MOUTH EVERY 12 HOURS  . ibuprofen (ADVIL) 200 MG tablet Take 200 mg by mouth every 6 (six) hours as needed.  . magnesium oxide (MAG-OX) 400 MG tablet Take 400 mg by mouth daily.  . meclizine (ANTIVERT) 12.5 MG tablet Take 1-2 tablets (12.5-25 mg total) by mouth 3 (three) times daily as needed for dizziness.  . Misc  Natural Products (GLUCOSAMINE CHOND DOUBLE STR PO) Take 1 tablet by mouth 2 (two) times daily.  . montelukast (SINGULAIR) 10 MG tablet TAKE ONE TABLET BY MOUTH DAILY  . triamcinolone (NASACORT ALLERGY 24HR) 55 MCG/ACT AERO nasal inhaler Place 1 spray into the nose at bedtime.  Marland Kitchen venlafaxine (EFFEXOR) 75 MG tablet TAKE ONE TABLET BY MOUTH DAILY  . [DISCONTINUED] mupirocin cream (BACTROBAN) 2 % Apply 1 application topically 2 (two) times daily. (Patient not taking: Reported on 08/08/2018)  . [DISCONTINUED] ranitidine (ZANTAC) 150 MG tablet TAKE 1 TABLET BY MOUTH TWICE DAILY (Patient not taking: Reported on 08/08/2018)  . [DISCONTINUED] tiZANidine (ZANAFLEX) 2 MG tablet Take 1 tablet (2 mg total) by mouth every 6 (six) hours as needed for muscle spasms. (Patient not taking: Reported on 08/08/2018)   No facility-administered encounter medications on file as of 08/08/2018.     Activities of Daily Living In your present state of health, do you have any difficulty performing the following activities: 08/08/2018  Hearing? N  Vision? N  Difficulty concentrating or making decisions? N  Walking or climbing stairs? N  Dressing or bathing? N  Doing errands, shopping? N  Preparing Food and eating ? N  Using the Toilet? N  In the past six months, have you accidently leaked urine? N  Do you have problems with loss of bowel control? N  Managing your Medications? N  Managing your Finances? N  Housekeeping or managing your Housekeeping? N  Some recent data might be hidden    Patient Care Team: [name], MD as PCP - General (Internal Medicine) Rolm Bookbinder, MD as Consulting Physician  (Dermatology) Janalyn Harder, MD (Urology)   Assessment:   This is a routine wellness examination for Adrian Rios. Physical assessment deferred to PCP.  Exercise Activities and Dietary recommendations Current Exercise Habits: Home exercise routine Diet (meal preparation, eat out, water intake, caffeinated beverages, dairy products, fruits and vegetables): in general, a "healthy" diet  , well balanced eats a variety of fruits and vegetables daily, limits salt, fat/cholesterol, sugar,carbohydrates,caffeine, drinks 6-8 glasses of water daily.  Goals    . Patient Stated     Continue to walk and eat healthy. Enjoy life and spend as much time as possible with my family and grandchildren.       Fall Risk Fall Risk  05/26/2017 02/04/2017 01/11/2017 08/19/2016 08/20/2015  Falls in the past year? No No No No No    Depression Screen PHQ 2/9 Scores 05/26/2017 02/04/2017 01/11/2017 08/19/2016  PHQ - 2 Score 0 0 0 0  PHQ- 9 Score 2 - - 7    Cognitive Function MMSE - Mini Mental State Exam 05/26/2017  Orientation to time 5  Orientation to Place 5  Registration 3  Attention/ Calculation 5  Recall 1  Language- name 2 objects 2  Language- repeat 1  Language- follow 3 step command 3  Language- read & follow direction 1  Write a sentence 1  Copy design 1  Total score 28       Ad8 score reviewed for issues:  Issues making decisions: no  Less interest in hobbies / activities: no  Repeats questions, stories (family complaining): no  Trouble using ordinary gadgets (microwave, computer, phone):no  Forgets the month or year: no  Mismanaging finances: no  Remembering appts: no  Daily problems with thinking and/or memory: no Ad8 score is= 0  Immunization History  Administered Date(s) Administered  . H1N1 04/12/2008  . Influenza Split 12/16/2010, 01/06/2012  .  Influenza Whole 02/03/2007, 12/21/2007, 12/05/2009  . Influenza, High Dose Seasonal PF 12/20/2012, 12/02/2015, 11/11/2017  .  Influenza,inj,Quad PF,6+ Mos 12/07/2013  . Influenza-Unspecified 11/26/2017  . Pneumococcal Conjugate-13 08/20/2015  . Pneumococcal Polysaccharide-23 03/09/2002, 12/28/2012  . Tetanus 02/22/2011  . Varicella 07/21/2005  . Zoster 04/25/2006   Screening Tests Health Maintenance  Topic Date Due  . INFLUENZA VACCINE  10/01/2018  . TETANUS/TDAP  02/21/2021  . PNA vac Low Risk Adult  Completed       Plan:    Reviewed health maintenance screenings with patient today and relevant education, vaccines, and/or referrals were provided.   Continue to eat heart healthy diet (full of fruits, vegetables, whole grains, lean protein, water--limit salt, fat, and sugar intake) and increase physical activity as tolerated.  Continue doing brain stimulating activities (puzzles, reading, adult coloring books, staying active) to keep memory sharp.   I have personally reviewed and noted the following in the patient's chart:   . Medical and social history . Use of alcohol, tobacco or illicit drugs  . Current medications and supplements . Functional ability and status . Nutritional status . Physical activity . Advanced directives . List of other physicians . Screenings to include cognitive, depression, and falls . Referrals and appointments  In addition, I have reviewed and discussed with patient certain preventive protocols, quality metrics, and best practice recommendations. A written personalized care plan for preventive services as well as general preventive health recommendations were provided to patient.     Michiel Cowboy, RN  08/08/2018   Medical screening examination/treatment/procedure(s) were performed by non-physician practitioner and as supervising physician I was immediately available for consultation/collaboration. I agree with above. [name], MD

## 2018-08-15 DIAGNOSIS — M9903 Segmental and somatic dysfunction of lumbar region: Secondary | ICD-10-CM | POA: Diagnosis not present

## 2018-08-15 DIAGNOSIS — M5136 Other intervertebral disc degeneration, lumbar region: Secondary | ICD-10-CM | POA: Diagnosis not present

## 2018-08-15 DIAGNOSIS — M9902 Segmental and somatic dysfunction of thoracic region: Secondary | ICD-10-CM | POA: Diagnosis not present

## 2018-08-15 DIAGNOSIS — M9905 Segmental and somatic dysfunction of pelvic region: Secondary | ICD-10-CM | POA: Diagnosis not present

## 2018-08-15 DIAGNOSIS — M5134 Other intervertebral disc degeneration, thoracic region: Secondary | ICD-10-CM | POA: Diagnosis not present

## 2018-08-17 DIAGNOSIS — M9902 Segmental and somatic dysfunction of thoracic region: Secondary | ICD-10-CM | POA: Diagnosis not present

## 2018-08-17 DIAGNOSIS — M5136 Other intervertebral disc degeneration, lumbar region: Secondary | ICD-10-CM | POA: Diagnosis not present

## 2018-08-17 DIAGNOSIS — M5134 Other intervertebral disc degeneration, thoracic region: Secondary | ICD-10-CM | POA: Diagnosis not present

## 2018-08-17 DIAGNOSIS — M9905 Segmental and somatic dysfunction of pelvic region: Secondary | ICD-10-CM | POA: Diagnosis not present

## 2018-08-17 DIAGNOSIS — M9903 Segmental and somatic dysfunction of lumbar region: Secondary | ICD-10-CM | POA: Diagnosis not present

## 2018-08-31 DIAGNOSIS — M9903 Segmental and somatic dysfunction of lumbar region: Secondary | ICD-10-CM | POA: Diagnosis not present

## 2018-08-31 DIAGNOSIS — M9905 Segmental and somatic dysfunction of pelvic region: Secondary | ICD-10-CM | POA: Diagnosis not present

## 2018-08-31 DIAGNOSIS — M5134 Other intervertebral disc degeneration, thoracic region: Secondary | ICD-10-CM | POA: Diagnosis not present

## 2018-08-31 DIAGNOSIS — M5136 Other intervertebral disc degeneration, lumbar region: Secondary | ICD-10-CM | POA: Diagnosis not present

## 2018-08-31 DIAGNOSIS — M9902 Segmental and somatic dysfunction of thoracic region: Secondary | ICD-10-CM | POA: Diagnosis not present

## 2018-09-05 DIAGNOSIS — M5136 Other intervertebral disc degeneration, lumbar region: Secondary | ICD-10-CM | POA: Diagnosis not present

## 2018-09-05 DIAGNOSIS — M9902 Segmental and somatic dysfunction of thoracic region: Secondary | ICD-10-CM | POA: Diagnosis not present

## 2018-09-05 DIAGNOSIS — M9905 Segmental and somatic dysfunction of pelvic region: Secondary | ICD-10-CM | POA: Diagnosis not present

## 2018-09-05 DIAGNOSIS — M5134 Other intervertebral disc degeneration, thoracic region: Secondary | ICD-10-CM | POA: Diagnosis not present

## 2018-09-05 DIAGNOSIS — M9903 Segmental and somatic dysfunction of lumbar region: Secondary | ICD-10-CM | POA: Diagnosis not present

## 2018-09-07 DIAGNOSIS — M9903 Segmental and somatic dysfunction of lumbar region: Secondary | ICD-10-CM | POA: Diagnosis not present

## 2018-09-07 DIAGNOSIS — M9905 Segmental and somatic dysfunction of pelvic region: Secondary | ICD-10-CM | POA: Diagnosis not present

## 2018-09-07 DIAGNOSIS — M9902 Segmental and somatic dysfunction of thoracic region: Secondary | ICD-10-CM | POA: Diagnosis not present

## 2018-09-07 DIAGNOSIS — M5134 Other intervertebral disc degeneration, thoracic region: Secondary | ICD-10-CM | POA: Diagnosis not present

## 2018-09-07 DIAGNOSIS — M5136 Other intervertebral disc degeneration, lumbar region: Secondary | ICD-10-CM | POA: Diagnosis not present

## 2018-09-08 DIAGNOSIS — L821 Other seborrheic keratosis: Secondary | ICD-10-CM | POA: Diagnosis not present

## 2018-09-08 DIAGNOSIS — L814 Other melanin hyperpigmentation: Secondary | ICD-10-CM | POA: Diagnosis not present

## 2018-09-08 DIAGNOSIS — I788 Other diseases of capillaries: Secondary | ICD-10-CM | POA: Diagnosis not present

## 2018-09-12 DIAGNOSIS — M9902 Segmental and somatic dysfunction of thoracic region: Secondary | ICD-10-CM | POA: Diagnosis not present

## 2018-09-12 DIAGNOSIS — M9903 Segmental and somatic dysfunction of lumbar region: Secondary | ICD-10-CM | POA: Diagnosis not present

## 2018-09-12 DIAGNOSIS — M5134 Other intervertebral disc degeneration, thoracic region: Secondary | ICD-10-CM | POA: Diagnosis not present

## 2018-09-12 DIAGNOSIS — M9905 Segmental and somatic dysfunction of pelvic region: Secondary | ICD-10-CM | POA: Diagnosis not present

## 2018-09-12 DIAGNOSIS — M5136 Other intervertebral disc degeneration, lumbar region: Secondary | ICD-10-CM | POA: Diagnosis not present

## 2018-09-14 DIAGNOSIS — M9902 Segmental and somatic dysfunction of thoracic region: Secondary | ICD-10-CM | POA: Diagnosis not present

## 2018-09-14 DIAGNOSIS — M9905 Segmental and somatic dysfunction of pelvic region: Secondary | ICD-10-CM | POA: Diagnosis not present

## 2018-09-14 DIAGNOSIS — M5136 Other intervertebral disc degeneration, lumbar region: Secondary | ICD-10-CM | POA: Diagnosis not present

## 2018-09-14 DIAGNOSIS — M9903 Segmental and somatic dysfunction of lumbar region: Secondary | ICD-10-CM | POA: Diagnosis not present

## 2018-09-14 DIAGNOSIS — M5134 Other intervertebral disc degeneration, thoracic region: Secondary | ICD-10-CM | POA: Diagnosis not present

## 2018-09-19 DIAGNOSIS — M9905 Segmental and somatic dysfunction of pelvic region: Secondary | ICD-10-CM | POA: Diagnosis not present

## 2018-09-19 DIAGNOSIS — M5134 Other intervertebral disc degeneration, thoracic region: Secondary | ICD-10-CM | POA: Diagnosis not present

## 2018-09-19 DIAGNOSIS — M9903 Segmental and somatic dysfunction of lumbar region: Secondary | ICD-10-CM | POA: Diagnosis not present

## 2018-09-19 DIAGNOSIS — M5136 Other intervertebral disc degeneration, lumbar region: Secondary | ICD-10-CM | POA: Diagnosis not present

## 2018-09-19 DIAGNOSIS — M9902 Segmental and somatic dysfunction of thoracic region: Secondary | ICD-10-CM | POA: Diagnosis not present

## 2018-09-21 DIAGNOSIS — M9905 Segmental and somatic dysfunction of pelvic region: Secondary | ICD-10-CM | POA: Diagnosis not present

## 2018-09-21 DIAGNOSIS — M5136 Other intervertebral disc degeneration, lumbar region: Secondary | ICD-10-CM | POA: Diagnosis not present

## 2018-09-21 DIAGNOSIS — M5134 Other intervertebral disc degeneration, thoracic region: Secondary | ICD-10-CM | POA: Diagnosis not present

## 2018-09-21 DIAGNOSIS — M9903 Segmental and somatic dysfunction of lumbar region: Secondary | ICD-10-CM | POA: Diagnosis not present

## 2018-09-21 DIAGNOSIS — M9902 Segmental and somatic dysfunction of thoracic region: Secondary | ICD-10-CM | POA: Diagnosis not present

## 2018-10-01 ENCOUNTER — Other Ambulatory Visit: Payer: Self-pay | Admitting: Internal Medicine

## 2018-10-05 IMAGING — CT CT CHEST W/O CM
2 of 3 series · 15 of 36 positions shown, 18 images · non-contrast
Comparison: Chest radiograph October 25, 2016 at 1088 hours

CLINICAL DATA: Shortness of breath. Fever and urinary tract
infection. History of sarcoidosis, bladder or prostate cancer.

EXAM:
CT CHEST WITHOUT CONTRAST
TECHNIQUE: Multidetector CT imaging of the chest was performed following the
standard protocol without IV contrast.

[Series 2: thorax · axial · 0.85mm/px · z∈[+769,+1021]mm · 12 of 150 slices shown, 15 images]
[im 12/150  mediastinal]
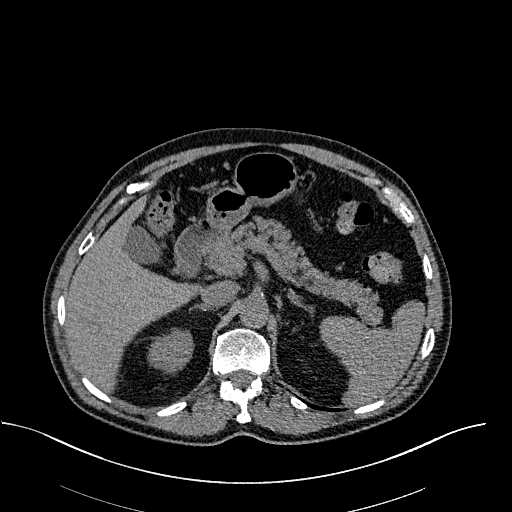
[im 12/150  lung]
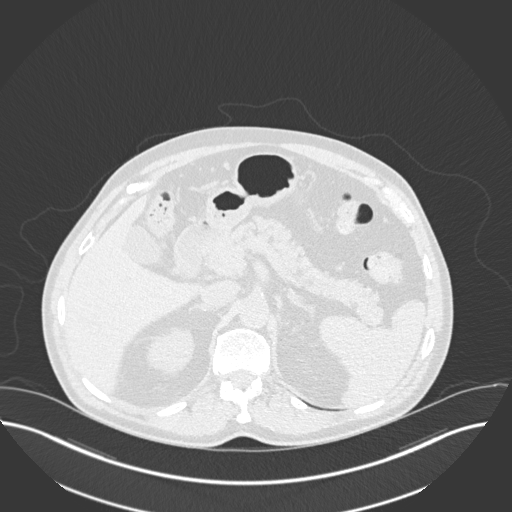
[im 23/150  lung]
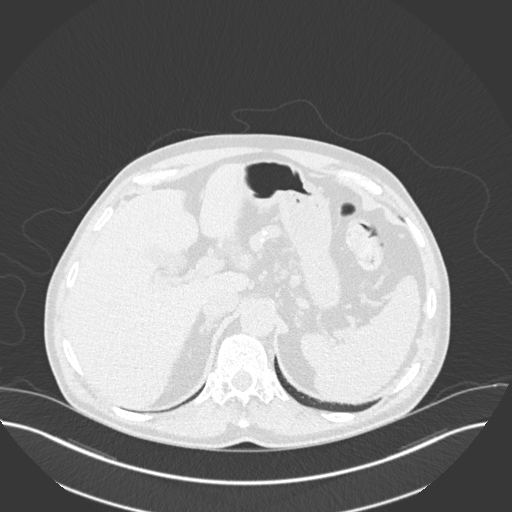
[im 34/150  lung]
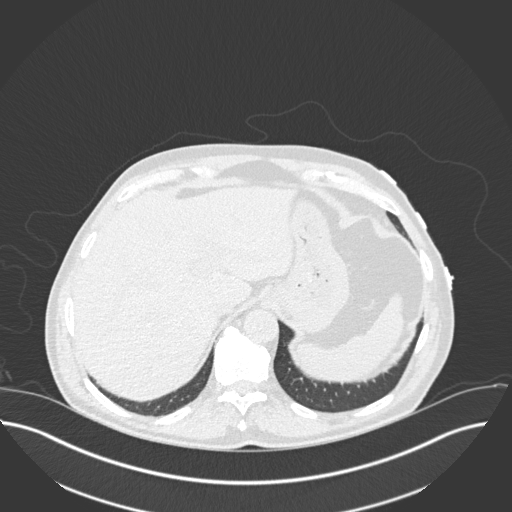
[im 45/150  lung]
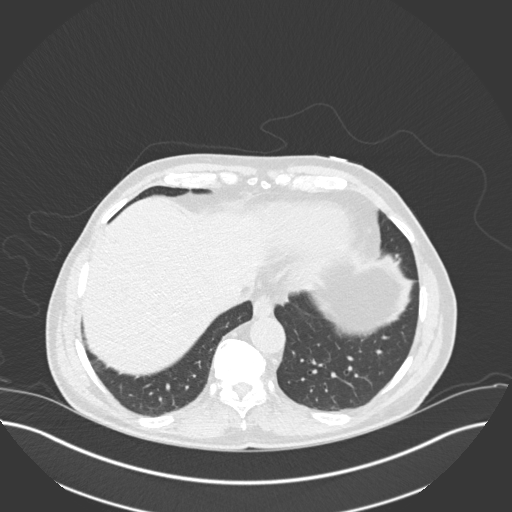
[im 56/150  mediastinal]
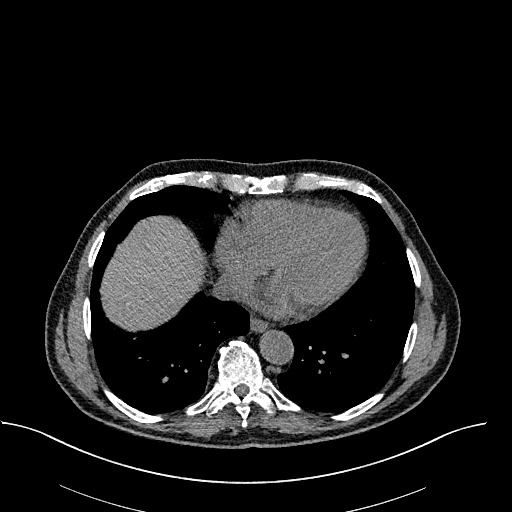
[im 56/150  lung]
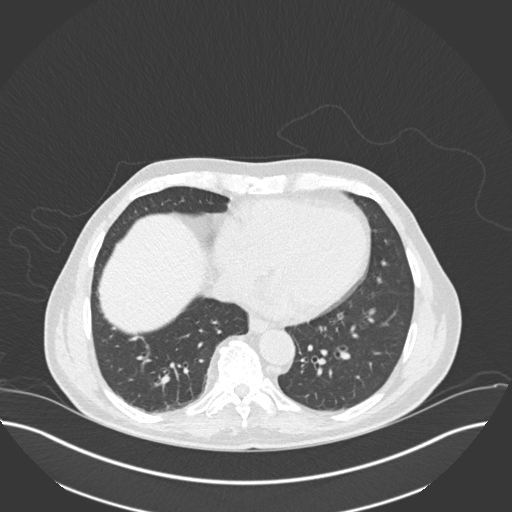
[im 67/150  lung]
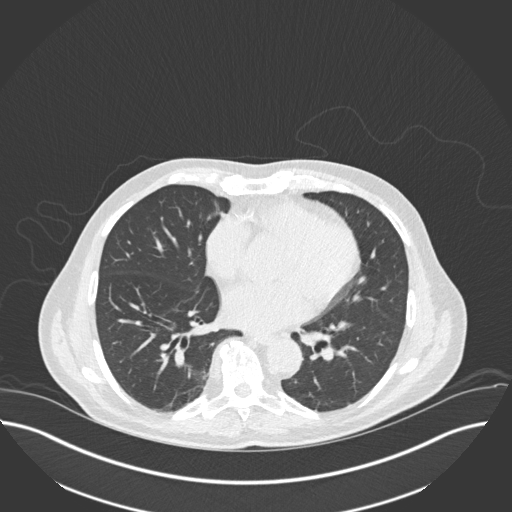
[im 83/150  lung]
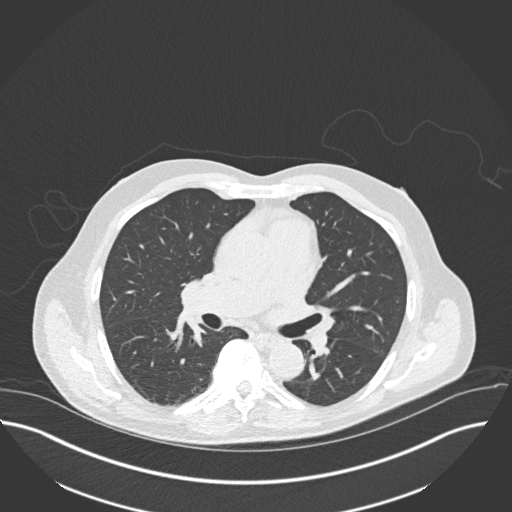
[im 94/150  lung]
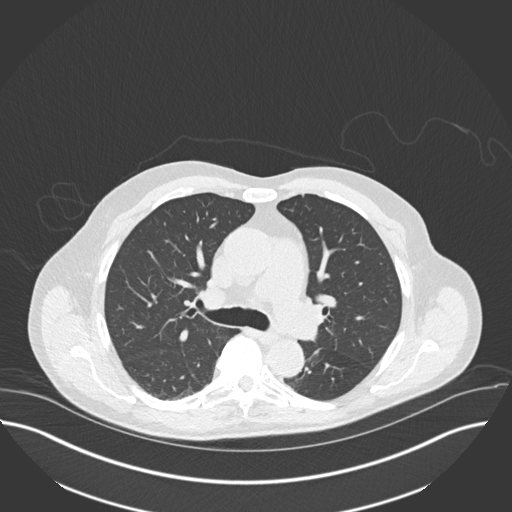
[im 105/150  mediastinal]
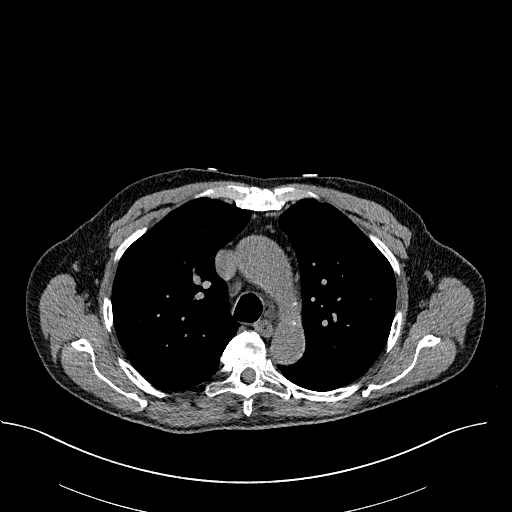
[im 105/150  lung]
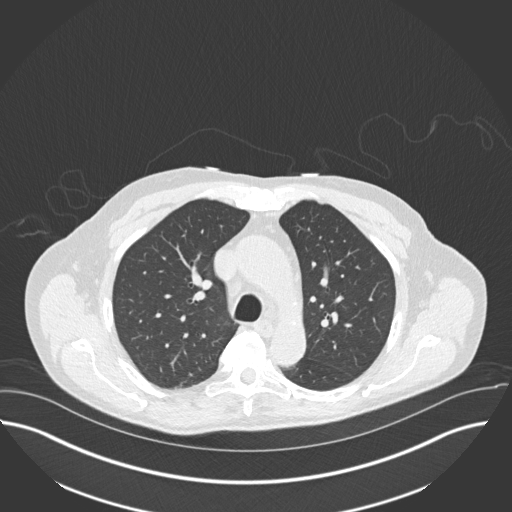
[im 116/150  lung]
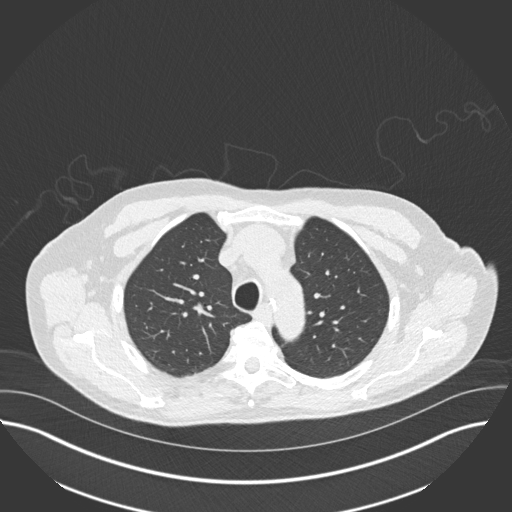
[im 127/150  lung]
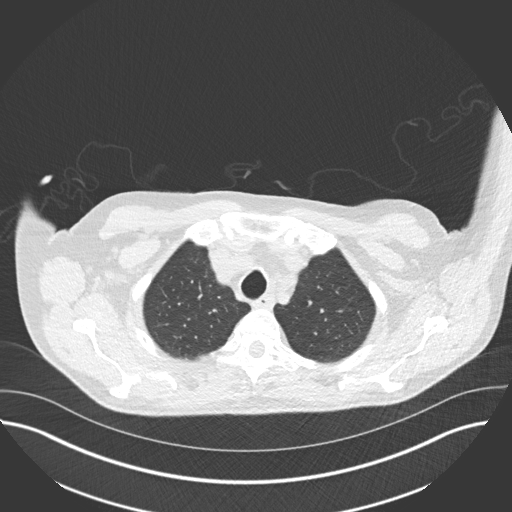
[im 138/150  lung]
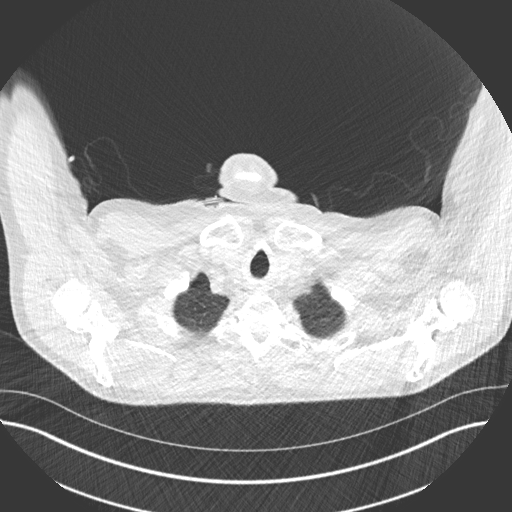

[Series 5: coronal · coronal · 0.56mm/px · 3 of 132 slices shown]
[im 27/132  lung]
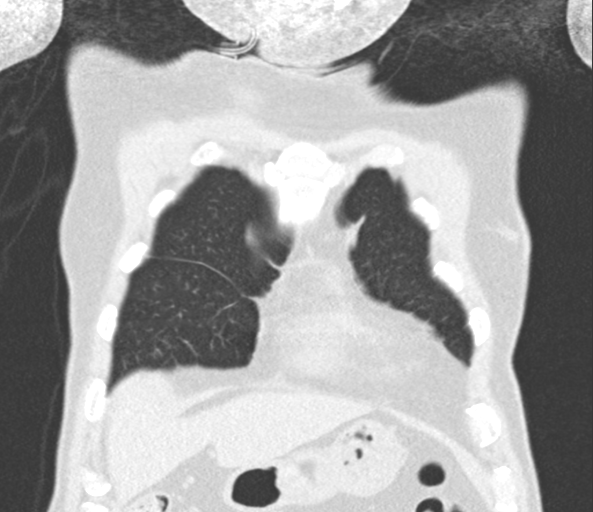
[im 53/132  lung]
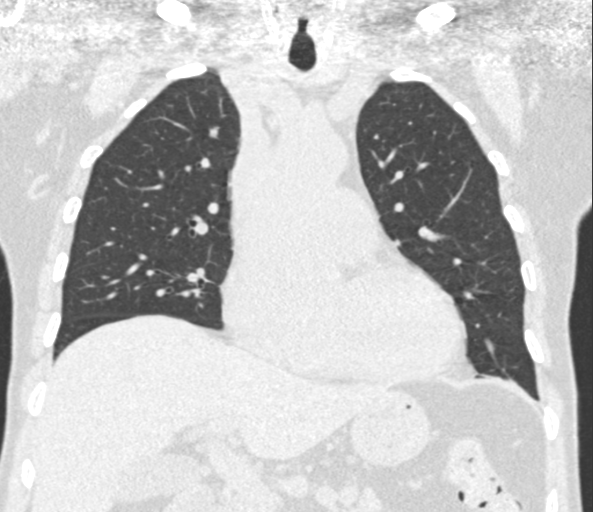
[im 79/132  lung]
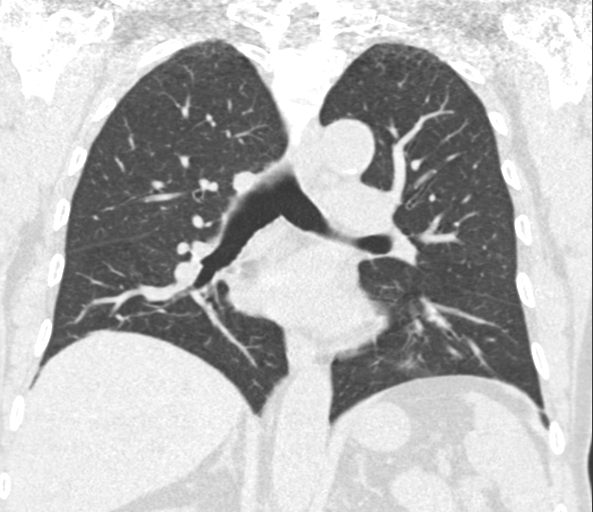

[15 of 36 positions shown; findings below may reference images not displayed]

FINDINGS: CARDIOVASCULAR: Heart size is normal. Mild coronary artery
calcifications. No pericardial effusion. Thoracic aorta is normal
course and caliber, mild calcific atherosclerosis. LEFT vertebral
artery arises directly from the aortic arch, normal variant.

MEDIASTINUM/NODES: No mediastinal mass. No lymphadenopathy by CT
size criteria though sensitivity decreased without oral contrast.
Normal appearance of thoracic esophagus though not tailored for
evaluation.

LUNGS/PLEURA: Tracheobronchial tree is patent, no pneumothorax. No
pleural effusions, focal consolidations, pulmonary nodules or
masses. Mildly thickened RIGHT minor fissure. Minimal bibasilar
atelectasis/ scarring.

UPPER ABDOMEN: Nonacute.

MUSCULOSKELETAL: Included soft tissues and included osseous
structures appear normal.
IMPRESSION: Minimal bibasilar atelectasis/ scarring.

Aortic Atherosclerosis ([id number].O).

## 2018-10-28 ENCOUNTER — Telehealth: Payer: Self-pay | Admitting: Internal Medicine

## 2018-10-28 ENCOUNTER — Ambulatory Visit (INDEPENDENT_AMBULATORY_CARE_PROVIDER_SITE_OTHER): Payer: Medicare Other | Admitting: Internal Medicine

## 2018-10-28 ENCOUNTER — Other Ambulatory Visit: Payer: Self-pay

## 2018-10-28 ENCOUNTER — Other Ambulatory Visit (INDEPENDENT_AMBULATORY_CARE_PROVIDER_SITE_OTHER): Payer: Medicare Other

## 2018-10-28 ENCOUNTER — Encounter: Payer: Self-pay | Admitting: Internal Medicine

## 2018-10-28 VITALS — BP 128/78 | HR 77 | Temp 98.2°F | Resp 16 | Ht 67.0 in | Wt 171.8 lb

## 2018-10-28 DIAGNOSIS — R7303 Prediabetes: Secondary | ICD-10-CM | POA: Diagnosis not present

## 2018-10-28 DIAGNOSIS — E785 Hyperlipidemia, unspecified: Secondary | ICD-10-CM | POA: Diagnosis not present

## 2018-10-28 DIAGNOSIS — Z23 Encounter for immunization: Secondary | ICD-10-CM

## 2018-10-28 DIAGNOSIS — F32A Depression, unspecified: Secondary | ICD-10-CM

## 2018-10-28 DIAGNOSIS — K219 Gastro-esophageal reflux disease without esophagitis: Secondary | ICD-10-CM | POA: Diagnosis not present

## 2018-10-28 DIAGNOSIS — F329 Major depressive disorder, single episode, unspecified: Secondary | ICD-10-CM

## 2018-10-28 DIAGNOSIS — J4520 Mild intermittent asthma, uncomplicated: Secondary | ICD-10-CM

## 2018-10-28 LAB — CBC WITH DIFFERENTIAL/PLATELET
Basophils Absolute: 0.1 10*3/uL (ref 0.0–0.1)
Basophils Relative: 1.1 % (ref 0.0–3.0)
Eosinophils Absolute: 0.2 10*3/uL (ref 0.0–0.7)
Eosinophils Relative: 3 % (ref 0.0–5.0)
HCT: 41.6 % (ref 39.0–52.0)
Hemoglobin: 14 g/dL (ref 13.0–17.0)
Lymphocytes Relative: 26.6 % (ref 12.0–46.0)
Lymphs Abs: 2.2 10*3/uL (ref 0.7–4.0)
MCHC: 33.8 g/dL (ref 30.0–36.0)
MCV: 90.9 fl (ref 78.0–100.0)
Monocytes Absolute: 0.7 10*3/uL (ref 0.1–1.0)
Monocytes Relative: 8.9 % (ref 3.0–12.0)
Neutro Abs: 5 10*3/uL (ref 1.4–7.7)
Neutrophils Relative %: 60.4 % (ref 43.0–77.0)
Platelets: 340 10*3/uL (ref 150.0–400.0)
RBC: 4.58 Mil/uL (ref 4.22–5.81)
RDW: 13.2 % (ref 11.5–15.5)
WBC: 8.2 10*3/uL (ref 4.0–10.5)

## 2018-10-28 LAB — LIPID PANEL
Cholesterol: 186 mg/dL (ref 0–200)
HDL: 42.3 mg/dL (ref >39.00)
LDL Cholesterol: 111 mg/dL — ABNORMAL HIGH (ref 0–99)
NonHDL: 143.88
Total CHOL/HDL Ratio: 4
Triglycerides: 163 mg/dL — ABNORMAL HIGH (ref 0.0–149.0)
VLDL: 32.6 mg/dL (ref 0.0–40.0)

## 2018-10-28 LAB — COMPREHENSIVE METABOLIC PANEL
ALT: 15 U/L (ref 0–53)
AST: 14 U/L (ref 0–37)
Albumin: 4.1 g/dL (ref 3.5–5.2)
Alkaline Phosphatase: 86 U/L (ref 39–117)
BUN: 21 mg/dL (ref 6–23)
CO2: 25 mEq/L (ref 19–32)
Calcium: 9.5 mg/dL (ref 8.4–10.5)
Chloride: 106 mEq/L (ref 96–112)
Creatinine, Ser: 1.05 mg/dL (ref 0.40–1.50)
GFR: 68.26 mL/min (ref >60.00)
Glucose, Bld: 88 mg/dL (ref 70–99)
Potassium: 4.3 mEq/L (ref 3.5–5.1)
Sodium: 138 mEq/L (ref 135–145)
Total Bilirubin: 0.5 mg/dL (ref 0.2–1.2)
Total Protein: 7.3 g/dL (ref 6.0–8.3)

## 2018-10-28 LAB — HEMOGLOBIN A1C: Hgb A1c MFr Bld: 5.9 % (ref 4.6–6.5)

## 2018-10-28 LAB — TSH: TSH: 1.17 u[IU]/mL (ref 0.35–4.50)

## 2018-10-28 MED ORDER — VENLAFAXINE HCL 75 MG PO TABS: 75.0000 mg | ORAL_TABLET | Freq: Every day | ORAL | 1 refills | Status: DC

## 2018-10-28 MED ORDER — MONTELUKAST SODIUM 10 MG PO TABS: 10.0000 mg | ORAL_TABLET | Freq: Every day | ORAL | 1 refills | Status: DC

## 2018-10-28 NOTE — Assessment & Plan Note (Addendum)
Taking one pepcid nightly Controlled Continue

## 2018-10-28 NOTE — Telephone Encounter (Signed)
Pt called in to schedule medication follow up but says that he has 7 pills left and has to go out of town. Pt would like to know if he could get a refill until his apt to avoid him running out?   Please advise   CB: [phone]  Pharmacy:  [facility] at [place], [name] - [place] [phone] (Phone) [phone] (Fax)

## 2018-10-28 NOTE — Patient Instructions (Signed)
  Tests ordered today. Your results will be released to [place] (or called to you) after review.  If any changes need to be made, you will be notified at that same time.  Flu immunization administered today.    Medications reviewed and updated.  Changes include :   none  Your prescription(s) have been submitted to your pharmacy. Please take as directed and contact our office if you believe you are having problem(s) with the medication(s).    Please followup in 1 year

## 2018-10-28 NOTE — Assessment & Plan Note (Signed)
Using Advair daily Taking Singulair daily Controlled Continue above

## 2018-10-28 NOTE — Telephone Encounter (Signed)
Refills done at appointment

## 2018-10-28 NOTE — Assessment & Plan Note (Signed)
Controlled, stable Continue current dose of medication  

## 2018-10-28 NOTE — Assessment & Plan Note (Signed)
Check lipid panel, TSH, CMP Not on any medication-controlled with lifestyle Continue regular exercise Continue healthy diet

## 2018-10-28 NOTE — Telephone Encounter (Signed)
Patient has an appointment today 8/28?

## 2018-10-28 NOTE — Assessment & Plan Note (Signed)
Check a1c Low sugar / carb diet Stressed regular exercise   

## 2018-10-28 NOTE — Progress Notes (Signed)
Subjective:    Patient ID: Adrian Rios, male    DOB: November 08, 1940, 78 y.o.   MRN: [id number]  HPI The patient is here for follow up.  He is exercising regularly - walking about one mile a day, yard work.     Depression: He is taking his medication daily as prescribed. He denies any side effects from the medication. He feels his depression is well controlled and he is happy with his current dose of medication.   Prediabetes:  He is compliant with a low sugar/carbohydrate diet.  He is exercising regularly.  GERD:  He is taking over-the-counter Pepcid daily.  He denies any GERD symptoms and feels his GERD is well controlled.   Hyperlipidemia: He is taking his medication daily. He is compliant with a low fat/cholesterol diet. He denies myalgias.   Reactive airway disease: He is taking Advair once daily.  He is taking the Singulair daily.  He denies any cough, wheeze or shortness of breath on a regular basis.  Medications and allergies reviewed with patient and updated if appropriate.  Patient Active Problem List   Diagnosis Date Noted  . Dizziness 06/21/2018  . Cellulitis of abdominal wall 04/08/2018  . Physical deconditioning 01/24/2018  . Gait disorder 01/24/2018  . Left hip pain 11/11/2017  . Abdominal wall hernia 05/26/2017  . Recurrent urinary tract infection 11/13/2016  . Sepsis ([place]) 11/13/2016  . Chronic diastolic CHF (congestive heart failure) ([place]) 11/12/2016  . Fatigue 08/19/2016  . History of bladder cancer 06/18/2016  . History of prostate cancer 06/18/2016  . 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 Carotid bruit 02/17/2016  . Prediabetes 08/20/2015  . Overweight (BMI 25.0-29.9) 08/20/2015  . Esophageal reflux 08/20/2015  . Glaucoma 02/14/2015  . Varicose veins of lower extremities with other complications [id number]  . RAD (reactive airway disease) 10/27/2011  . 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  ERECTILE DYSFUNCTION 04/23/2010  . [place] DISEASE, LUMBOSACRAL SPINE 02/06/2010  . 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 Depression 04/12/2008  . Allergic rhinitis 04/12/2008  . Hyperlipidemia 10/05/2006  . Sarcoidosis ([place]) 07/05/2006    Current Outpatient Medications on File Prior to Visit  Medication Sig Dispense Refill  . acetaminophen (TYLENOL) 500 MG tablet Take 500 mg by mouth every 6 (six) hours as needed.    Marland Kitchen ADVAIR DISKUS 250-50 MCG/DOSE AEPB INHALE 1 DOSE BY MOUTH EVERY 12 HOURS 60 each 5  . ibuprofen (ADVIL) 200 MG tablet Take 200 mg by mouth every 6 (six) hours as needed.    . magnesium oxide (MAG-OX) 400 MG tablet Take 400 mg by mouth daily.    . meclizine (ANTIVERT) 12.5 MG tablet Take 1-2 tablets (12.5-25 mg total) by mouth 3 (three) times daily as needed for dizziness. 60 tablet 0  . Misc Natural Products (GLUCOSAMINE CHOND DOUBLE STR PO) Take 1 tablet by mouth 2 (two) times daily.    Marland Kitchen triamcinolone (NASACORT ALLERGY 24HR) 55 MCG/ACT AERO nasal inhaler Place 1 spray into the nose at bedtime.     No current facility-administered medications on file prior to visit.     Past Medical History:  Diagnosis Date  . Allergic rhinitis   . Arthritis   . Benign localized prostatic hyperplasia with lower urinary tract symptoms (LUTS)   . Bladder tumor   . Borderline glaucoma of right eye   . Cancer [facility])    bladder and prostate  . Carotid bruit    per  duplex 03-11-2016 RICA 1-39%  . Chronic throat clearing   . DDD (degenerative disc disease), lumbar   . Depression   . ED (erectile dysfunction)   . Elevated PSA    urologist-  dr [name]--- s/p  prostate bx's  . GERD (gastroesophageal reflux disease)   . Hematuria   . History of chronic bronchitis   . 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 History of squamous cell carcinoma excision    several times  . Hyperlipidemia   . Pre-diabetes   . Premature ventricular contractions (PVCs)  (VPCs)   . RAD (reactive airway disease)   . 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 Sensorineural hearing loss (SNHL) of both ears   . Sepsis ([place]) 09/2016  . UTI (urinary tract infection) 08/2016  . Wears glasses   . Wears hearing aid    bilateral    Past Surgical History:  Procedure Laterality Date  . APPENDECTOMY  2008  . CARDIOVASCULAR STRESS TEST  05/27/2010   normal nuclear study w/ no ischemia/  normal LV function and wall motion , ef 60%  . CYSTOSCOPY WITH INJECTION N/A 06/12/2016   Procedure: CYSTOSCOPY WITH INJECTION OF INDOCYANINE GREEN DYE;  Surgeon: Alexis Frock, MD;  Location: [facility];  Service: Urology;  Laterality: N/A;  . DEEP NECK LYMPH NODE BIOPSY / EXCISION  1970's   and Bronchoscopy w/ bx's ( dx Sarcoidosis)  . ILEOSTOMY    . IR NEPHROSTOMY PLACEMENT LEFT  11/18/2016  . MOHS SURGERY  2013 approx.    nasal; pre melanoma  . PARS PLANA VITRECTOMY Right 2007   repair macular pucker  . TONSILLECTOMY AND ADENOIDECTOMY  child  . TRANSURETHRAL RESECTION OF BLADDER TUMOR N/A 04/06/2016   Procedure: CYSTOSCOPY TRANSURETHRAL RESECTION OF BLADDER TUMORS (TURBT);  Surgeon: Carolan Clines, MD;  Location: [facility];  Service: Urology;  Laterality: N/A;  . TRANSURETHRAL RESECTION OF PROSTATE      Social History   Socioeconomic History  . Marital status: Married    Spouse name: Not on file  . Number of children: Not on file  . Years of education: Not on file  . Highest education level: Not on file  Occupational History  . Not on file  Social Needs  . Financial resource strain: Not hard at all  . Food insecurity    Worry: Never true    Inability: Never true  . Transportation needs    Medical: No    Non-medical: No  Tobacco Use  . Smoking status: Never Smoker  . Smokeless tobacco: Never Used  Substance and Sexual Activity  . Alcohol use: No  . Drug use: No  .  Sexual activity: Not Currently  Lifestyle  . Physical activity    Days per week: 4 days    Minutes per session: 40 min  . Stress: Only a little  Relationships  . Social connections    Talks on phone: More than three times a week    Gets together: More than three times a week    Attends religious service: More than 4 times per year    Active member of club or organization: Not on file    Attends meetings of clubs or organizations: More than 4 times per year    Relationship status: Married  Other Topics Concern  . Not  on file  Social History Narrative  . Not on file    Family History  Problem Relation Age of Onset  . Stroke Mother        in her 44s  . Breast cancer Sister   . Heart disease Maternal Grandmother 84       MI   . Breast cancer Paternal Grandmother   . Heart disease Paternal Grandfather 94       MI  . Stroke Brother   . Diabetes Neg Hx     Review of Systems  Constitutional: Negative for chills and fever.  Respiratory: Negative for cough, shortness of breath and wheezing.   Cardiovascular: Negative for chest pain, palpitations and leg swelling.  Neurological: Positive for dizziness (occ) and headaches (occ).       Objective:   Vitals:   10/28/18 1040  BP: 128/78  Pulse: 77  Resp: 16  Temp: 98.2 F (36.8 C)  SpO2: 95%   BP Readings from Last 3 Encounters:  10/28/18 128/78  04/08/18 102/60  01/24/18 104/68   Wt Readings from Last 3 Encounters:  10/28/18 171 lb 12.8 oz (77.9 kg)  04/08/18 171 lb (77.6 kg)  01/24/18 169 lb (76.7 kg)   Body mass index is 26.91 kg/m.   Physical Exam    Constitutional: Appears well-developed and well-nourished. No distress.  HENT:  Head: Normocephalic and atraumatic.  Neck: Neck supple. No tracheal deviation present. No thyromegaly present.  No cervical lymphadenopathy Cardiovascular: Normal rate, regular rhythm and normal heart sounds.   No murmur heard. No carotid bruit .  No edema Pulmonary/Chest: Effort  normal and breath sounds normal. No respiratory distress. No has no wheezes. No rales.  Skin: Skin is warm and dry. Not diaphoretic.  Psychiatric: Normal mood and affect. Behavior is normal.      Assessment & Plan:    See Problem List for Assessment and Plan of chronic medical problems.

## 2018-10-29 ENCOUNTER — Encounter: Payer: Self-pay | Admitting: Internal Medicine

## 2018-11-11 DIAGNOSIS — H2512 Age-related nuclear cataract, left eye: Secondary | ICD-10-CM | POA: Diagnosis not present

## 2018-11-11 DIAGNOSIS — H4051X4 Glaucoma secondary to other eye disorders, right eye, indeterminate stage: Secondary | ICD-10-CM | POA: Diagnosis not present

## 2018-11-11 DIAGNOSIS — B5801 Toxoplasma chorioretinitis: Secondary | ICD-10-CM | POA: Diagnosis not present

## 2019-01-06 DIAGNOSIS — D2272 Melanocytic nevi of left lower limb, including hip: Secondary | ICD-10-CM | POA: Diagnosis not present

## 2019-01-06 DIAGNOSIS — D1801 Hemangioma of skin and subcutaneous tissue: Secondary | ICD-10-CM | POA: Diagnosis not present

## 2019-01-06 DIAGNOSIS — B351 Tinea unguium: Secondary | ICD-10-CM | POA: Diagnosis not present

## 2019-01-06 DIAGNOSIS — D692 Other nonthrombocytopenic purpura: Secondary | ICD-10-CM | POA: Diagnosis not present

## 2019-01-06 DIAGNOSIS — D2262 Melanocytic nevi of left upper limb, including shoulder: Secondary | ICD-10-CM | POA: Diagnosis not present

## 2019-01-06 DIAGNOSIS — D225 Melanocytic nevi of trunk: Secondary | ICD-10-CM | POA: Diagnosis not present

## 2019-01-06 DIAGNOSIS — L814 Other melanin hyperpigmentation: Secondary | ICD-10-CM | POA: Diagnosis not present

## 2019-01-06 DIAGNOSIS — L821 Other seborrheic keratosis: Secondary | ICD-10-CM | POA: Diagnosis not present

## 2019-02-08 DIAGNOSIS — H903 Sensorineural hearing loss, bilateral: Secondary | ICD-10-CM | POA: Diagnosis not present

## 2019-02-08 DIAGNOSIS — R42 Dizziness and giddiness: Secondary | ICD-10-CM | POA: Diagnosis not present

## 2019-02-08 DIAGNOSIS — H6123 Impacted cerumen, bilateral: Secondary | ICD-10-CM | POA: Diagnosis not present

## 2019-02-08 DIAGNOSIS — Z570 Occupational exposure to noise: Secondary | ICD-10-CM | POA: Diagnosis not present

## 2019-02-08 DIAGNOSIS — Z01118 Encounter for examination of ears and hearing with other abnormal findings: Secondary | ICD-10-CM | POA: Diagnosis not present

## 2019-02-09 DIAGNOSIS — Z20828 Contact with and (suspected) exposure to other viral communicable diseases: Secondary | ICD-10-CM | POA: Diagnosis not present

## 2019-02-13 ENCOUNTER — Ambulatory Visit: Payer: Medicare Other

## 2019-02-16 ENCOUNTER — Ambulatory Visit: Payer: Self-pay | Admitting: *Deleted

## 2019-02-16 ENCOUNTER — Ambulatory Visit: Payer: Medicare Other | Attending: Internal Medicine

## 2019-02-16 DIAGNOSIS — Z20822 Contact with and (suspected) exposure to covid-19: Secondary | ICD-10-CM

## 2019-02-16 NOTE — Telephone Encounter (Signed)
I returned his call.   His Rios tested positive for COVID-19 because she had symptoms after they were exposed on 02/04/2019.   He woke up this morning with a fever of 99.6, headache and coughing.    He is hard of hearing and asked me to talk to his Rios and handed the phone to her.   I talked with his Rios for the rest of the call.  He does have underlying medical issues so I suggested he be tested for COVID-19 since he is having symptoms.    I instructed him to continue to quarantine even though the quarantine period for him and his Rios will be completed this weekend until he gets his test result back.   She verbalized understanding.  He is active on MyChart.    Adrian Rios got on line to make him an appt to be tested at the [facility] location which now requires an appt.   I talked her through making the on line appt for 10:00 this morning.   I let her know for him to wear a mask when they go.   A security guard will come to the car and escort him in and after the test escort him back out for safety and exposure reasons.   I let her know his result would show up on his MyChart account.   I explained the results of detected and not detected so they would understand what the results meant.   I also let them know a nurse would be calling him especially if the result is positive even with him seeing the result via his MyChart account.   I instructed them to go to the ED if he develops shortness of breath at rest or with minimal activity or for chest tightness/pressure or a decline in his condition is noted.    They both verbalized understanding and were agreeable to this plan.  They thanked me very much for Adrian help.    I also let them know if his test does come back positive it would be a good idea to send Adrian Rios a MyChart note letting her know.   They also agreed to this.  I forwarded these notes to Adrian Rios just so she would be aware of his situation.     Reason for Disposition . COVID-19  Testing, questions about  Answer Assessment - Initial Assessment Questions 1. COVID-19 CLOSE CONTACT: "Who is the person with the confirmed or suspected COVID-19 infection that you were exposed to?"     Adrian Rios tested positive for COVID one week ago today.    This morning I have a 99.6 fever, I have a fever, headache and coughing.    2. PLACE of CONTACT: "Where were you when you were exposed to COVID-19?" (e.g., home, school, medical waiting room; which city?)     Adrian Rios tested positive a week ago for COVID.   This morning I woke up sick.   We were exposed on 12/5.   We tested 02/08/2019.    3. TYPE of CONTACT: "How much contact was there?" (e.g., sitting next to, live in same house, work in same office, same building)     Adrian Rios is positive.   4. DURATION of CONTACT: "How long were you in contact with the COVID-19 patient?" (e.g., a few seconds, passed by person, a few minutes, 15 minutes or longer, live with the patient)     Rios in same house. 5. MASK: "Were  you wearing a mask?" "Was the other person wearing a mask?" Note: wearing a mask reduces the risk of an  otherwise close contact.     No not in the house 6. DATE of CONTACT: "When did you have contact with a COVID-19 patient?" (e.g., how many days ago)     Adrian Rios and I were initially exposed to [place] on 02/04/2019.   Rios was tested on 02/08/2019 due to symptoms.   Now this morning I have woke up with symptoms fever, headache and coughing. 7. COMMUNITY SPREAD: "Are there lots of cases of COVID-19 (community spread) where you live?" (See public health department website, if unsure)       Yes    [facility] is a "hot spot" right now with record numbers of COVID cases. 8. SYMPTOMS: "Do you have any symptoms?" (e.g., fever, cough, breathing difficulty, loss of taste or smell)     Fever, headache and coughing this morning. 9. PREGNANCY OR POSTPARTUM: "Is there any chance you are pregnant?" "When was your last menstrual period?" "Did you  deliver in the last 2 weeks?"     N/A 10. HIGH RISK: "Do you have any heart or lung problems? Do you have a weak immune system?" (e.g., heart failure, COPD, asthma, HIV positive, chemotherapy, renal failure, diabetes mellitus, sickle cell anemia, obesity)       *No Answer* 11.  TRAVEL: "Have you traveled out of the country recently?" If so, "When and where?"  Also ask about out-of-state travel, since the CDC has identified some high-risk cities for community spread in the [place].  Note: Travel becomes less relevant if there is widespread community transmission where the patient lives.       *No Answer*  Protocols used: CORONAVIRUS (COVID-19) EXPOSURE-A-AH

## 2019-02-17 ENCOUNTER — Other Ambulatory Visit: Payer: Self-pay | Admitting: Internal Medicine

## 2019-02-17 ENCOUNTER — Encounter: Payer: Self-pay | Admitting: Internal Medicine

## 2019-02-17 DIAGNOSIS — J209 Acute bronchitis, unspecified: Secondary | ICD-10-CM

## 2019-02-17 LAB — NOVEL CORONAVIRUS, NAA: SARS-CoV-2, NAA: DETECTED — AB

## 2019-02-18 ENCOUNTER — Telehealth: Payer: Self-pay | Admitting: Unknown Physician Specialty

## 2019-02-18 ENCOUNTER — Other Ambulatory Visit: Payer: Self-pay | Admitting: Unknown Physician Specialty

## 2019-02-18 DIAGNOSIS — U071 COVID-19: Secondary | ICD-10-CM

## 2019-02-18 NOTE — Progress Notes (Signed)
  I connected by phone with Adrian Rios on 02/18/2019 at 2:49 PM to discuss the potential use of an new treatment for mild to moderate COVID-19 viral infection in non-hospitalized patients.  This patient is a 78 y.o. male that meets the FDA criteria for Emergency Use Authorization of bamlanivimab or casirivimab\imdevimab.  Has a (+) direct SARS-CoV-2 viral test result  Has mild or moderate COVID-19   Is ? 78 years of age and weighs ? 40 kg  Is NOT hospitalized due to COVID-19  Is NOT requiring oxygen therapy or requiring an increase in baseline oxygen flow rate due to COVID-19  Is within 10 days of symptom onset  Has at least one of the high risk factor(s) for progression to severe COVID-19 and/or hospitalization as defined in EUA.  Specific high risk criteria : >/= 78 yo   I have spoken and communicated the following to the patient or parent/caregiver:  1. FDA has authorized the emergency use of bamlanivimab and casirivimab\imdevimab for the treatment of mild to moderate COVID-19 in adults and pediatric patients with positive results of direct SARS-CoV-2 viral testing who are 79 years of age and older weighing at least 40 kg, and who are at high risk for progressing to severe COVID-19 and/or hospitalization.  2. The significant known and potential risks and benefits of bamlanivimab and casirivimab\imdevimab, and the extent to which such potential risks and benefits are unknown.  3. Information on available alternative treatments and the risks and benefits of those alternatives, including clinical trials.  4. Patients treated with bamlanivimab and casirivimab\imdevimab should continue to self-isolate and use infection control measures (e.g., wear mask, isolate, social distance, avoid sharing personal items, clean and disinfect "high touch" surfaces, and frequent handwashing) according to CDC guidelines.   5. The patient or parent/caregiver has the option to accept or refuse  bamlanivimab or casirivimab\imdevimab .  After reviewing this information with the patient, The patient agreed to proceed with receiving the bamlanimivab infusion and will be provided a copy of the Fact sheet prior to receiving the infusion.[name] 02/18/2019 2:49 PM

## 2019-02-18 NOTE — Telephone Encounter (Signed)
  I connected by phone with Adrian Rios on 02/18/2019 at 2:41 PM to discuss the potential use of an new treatment for mild to moderate COVID-19 viral infection in non-hospitalized patients.  This patient is a 78 y.o. male that meets the FDA criteria for Emergency Use Authorization of bamlanivimab or casirivimab\imdevimab.  Has a (+) direct SARS-CoV-2 viral test result  Has mild or moderate COVID-19   Is ? 78 years of age and weighs ? 40 kg  Is NOT hospitalized due to COVID-19  Is NOT requiring oxygen therapy or requiring an increase in baseline oxygen flow rate due to COVID-19  Is within 10 days of symptom onset  Has at least one of the high risk factor(s) for progression to severe COVID-19 and/or hospitalization as defined in EUA.  Specific high risk criteria : >/= 78 yo   I have spoken and communicated the following to the patient or parent/caregiver:  1. FDA has authorized the emergency use of bamlanivimab and casirivimab\imdevimab for the treatment of mild to moderate COVID-19 in adults and pediatric patients with positive results of direct SARS-CoV-2 viral testing who are 45 years of age and older weighing at least 40 kg, and who are at high risk for progressing to severe COVID-19 and/or hospitalization.  2. The significant known and potential risks and benefits of bamlanivimab and casirivimab\imdevimab, and the extent to which such potential risks and benefits are unknown.  3. Information on available alternative treatments and the risks and benefits of those alternatives, including clinical trials.  4. Patients treated with bamlanivimab and casirivimab\imdevimab should continue to self-isolate and use infection control measures (e.g., wear mask, isolate, social distance, avoid sharing personal items, clean and disinfect "high touch" surfaces, and frequent handwashing) according to CDC guidelines.   5. The patient or parent/caregiver has the option to accept or refuse  bamlanivimab or casirivimab\imdevimab .  After reviewing this information with the patient, The patient agreed to proceed with receiving the bamlanimivab infusion and will be provided a copy of the Fact sheet prior to receiving the infusion.[name] 02/18/2019 2:41 PM

## 2019-02-21 ENCOUNTER — Ambulatory Visit (HOSPITAL_COMMUNITY)
Admission: RE | Admit: 2019-02-21 | Discharge: 2019-02-21 | Disposition: A | Discharge status: Home / Self Care | Payer: Medicare Other | Source: Ambulatory Visit | Attending: Pulmonary Disease | Admitting: Pulmonary Disease

## 2019-02-21 DIAGNOSIS — U071 COVID-19: Secondary | ICD-10-CM | POA: Insufficient documentation

## 2019-02-21 DIAGNOSIS — Z23 Encounter for immunization: Secondary | ICD-10-CM | POA: Insufficient documentation

## 2019-02-21 MED ORDER — METHYLPREDNISOLONE SODIUM SUCC 125 MG IJ SOLR: 125.0000 mg | Freq: Once | INTRAMUSCULAR | Status: DC | PRN

## 2019-02-21 MED ORDER — SODIUM CHLORIDE 0.9 % IV SOLN
INTRAVENOUS | Status: DC | PRN
  Administered 2019-02-21: 250 mL via INTRAVENOUS

## 2019-02-21 MED ORDER — DIPHENHYDRAMINE HCL 50 MG/ML IJ SOLN: 50.0000 mg | Freq: Once | INTRAMUSCULAR | Status: DC | PRN

## 2019-02-21 MED ORDER — SODIUM CHLORIDE 0.9 % IV SOLN
700.0000 mg | Freq: Once | INTRAVENOUS | Status: AC
  Administered 2019-02-21: 14:00:00 700 mg via INTRAVENOUS
  Filled 2019-02-21: qty 20

## 2019-02-21 MED ORDER — FAMOTIDINE IN NACL 20-0.9 MG/50ML-% IV SOLN: 20.0000 mg | Freq: Once | INTRAVENOUS | Status: DC | PRN

## 2019-02-21 MED ORDER — ALBUTEROL SULFATE HFA 108 (90 BASE) MCG/ACT IN AERS: 2.0000 | INHALATION_SPRAY | Freq: Once | RESPIRATORY_TRACT | Status: DC | PRN

## 2019-02-21 MED ORDER — EPINEPHRINE 0.3 MG/0.3ML IJ SOAJ: 0.3000 mg | Freq: Once | INTRAMUSCULAR | Status: DC | PRN

## 2019-02-21 NOTE — Progress Notes (Signed)
Patient ID: Adrian Rios, male   DOB: 04/30/1940, 78 y.o.   MRN: [id number]   Diagnosis: [id number]  Physician: Vaughan Browner  Procedure: [facility] Med: bamlanivimab infusion - Provided patient with bamlanimivab fact sheet for patients, parents and caregivers prior to infusion.  Complications: No immediate complications noted.  Discharge: Discharged home   Estrella Deeds 02/21/2019

## 2019-02-21 NOTE — Discharge Instructions (Signed)
Prevent the Spread of COVID-19 if You Are Sick If you are sick with COVID-19 or think you might have COVID-19, follow the steps below to help protect other people in your home and community. Stay home except to get medical care.  Stay home. Most people with COVID-19 have mild illness and are able to recover at home without medical care. Do not leave your home, except to get medical care. Do not visit public areas.  Take care of yourself. Get rest and stay hydrated.  Get medical care when needed. Call your doctor before you go to their office for care. But, if you have trouble breathing or other concerning symptoms, call 911 for immediate help.  Avoid public transportation, ride-sharing, or taxis. Separate yourself from other people and pets in your home.  As much as possible, stay in a specific room and away from other people and pets in your home. Also, you should use a separate bathroom, if available. If you need to be around other people or animals in or outside of the home, wear a cloth face covering. ? See COVID-19 and Animals if you have questions about pets: https://www.cdc.gov/coronavirus/2019-ncov/faq.html#COVID19animals Monitor your symptoms.  Common symptoms of COVID-19 include fever and cough. Trouble breathing is a more serious symptom that means you should get medical attention.  Follow care instructions from your healthcare provider and local health department. Your local health authorities will give instructions on checking your symptoms and reporting information. If you develop emergency warning signs for COVID-19 get medical attention immediately.  Emergency warning signs include*:  Trouble breathing  Persistent pain or pressure in the chest  New confusion or not able to be woken  Bluish lips or face *This list is not all inclusive. Please consult your medical provider for any other symptoms that are severe or concerning to you. Call 911 if you have a medical  emergency. If you have a medical emergency and need to call 911, notify the operator that you have or think you might have, COVID-19. If possible, put on a facemask before medical help arrives. Call ahead before visiting your doctor.  Call ahead. Many medical visits for routine care are being postponed or done by phone or telemedicine.  If you have a medical appointment that cannot be postponed, call your doctor's office. This will help the office protect themselves and other patients. If you are sick, wear a cloth covering over your nose and mouth.  You should wear a cloth face covering over your nose and mouth if you must be around other people or animals, including pets (even at home).  You don't need to wear the cloth face covering if you are alone. If you can't put on a cloth face covering (because of trouble breathing for example), cover your coughs and sneezes in some other way. Try to stay at least 6 feet away from other people. This will help protect the people around you. Note: During the COVID-19 pandemic, medical grade facemasks are reserved for healthcare workers and some first responders. You may need to make a cloth face covering using a scarf or bandana. Cover your coughs and sneezes.  Cover your mouth and nose with a tissue when you cough or sneeze.  Throw used tissues in a lined trash can.  Immediately wash your hands with soap and water for at least 20 seconds. If soap and water are not available, clean your hands with an alcohol-based hand sanitizer that contains at least 60% alcohol. Clean your hands often.    Wash your hands often with soap and water for at least 20 seconds. This is especially important after blowing your nose, coughing, or sneezing; going to the bathroom; and before eating or preparing food.  Use hand sanitizer if soap and water are not available. Use an alcohol-based hand sanitizer with at least 60% alcohol, covering all surfaces of your hands and rubbing  them together until they feel dry.  Soap and water are the best option, especially if your hands are visibly dirty.  Avoid touching your eyes, nose, and mouth with unwashed hands. Avoid sharing personal household items.  Do not share dishes, drinking glasses, cups, eating utensils, towels, or bedding with other people in your home.  Wash these items thoroughly after using them with soap and water or put them in the dishwasher. Clean all "high-touch" surfaces everyday.  Clean and disinfect high-touch surfaces in your "sick room" and bathroom. Let someone else clean and disinfect surfaces in common areas, but not your bedroom and bathroom.  If a caregiver or other person needs to clean and disinfect a sick person's bedroom or bathroom, they should do so on an as-needed basis. The caregiver/other person should wear a mask and wait as long as possible after the sick person has used the bathroom. High-touch surfaces include phones, remote controls, counters, tabletops, doorknobs, bathroom fixtures, toilets, keyboards, tablets, and bedside tables.  Clean and disinfect areas that may have blood, stool, or body fluids on them.  Use household cleaners and disinfectants. Clean the area or item with soap and water or another detergent if it is dirty. Then use a household disinfectant. ? Be sure to follow the instructions on the label to ensure safe and effective use of the product. Many products recommend keeping the surface wet for several minutes to ensure germs are killed. Many also recommend precautions such as wearing gloves and making sure you have good ventilation during use of the product. ? Most EPA-registered household disinfectants should be effective. How to discontinue home isolation  People with COVID-19 who have stayed home (home isolated) can stop home isolation under the following conditions: ? If you will not have a test to determine if you are still contagious, you can leave home  after these three things have happened:  You have had no fever for at least 72 hours (that is three full days of no fever without the use of medicine that reduces fevers) AND  other symptoms have improved (for example, when your cough or shortness of breath has improved) AND  at least 10 days have passed since your symptoms first appeared. ? If you will be tested to determine if you are still contagious, you can leave home after these three things have happened:  You no longer have a fever (without the use of medicine that reduces fevers) AND  other symptoms have improved (for example, when your cough or shortness of breath has improved) AND  you received two negative tests in a row, 24 hours apart. Your doctor will follow CDC guidelines. In all cases, follow the guidance of your healthcare provider and local health department. The decision to stop home isolation should be made in consultation with your healthcare provider and state and local health departments. Local decisions depend on local circumstances. cdc.gov/coronavirus 07/03/2018 This information is not intended to replace advice given to you by your health care provider. Make sure you discuss any questions you have with your health care provider. Document Released: 06/14/2018 Document Revised: 07/13/2018 Document Reviewed: 06/14/2018   Elsevier Patient Education  2020 [employer].  

## 2019-02-22 ENCOUNTER — Telehealth: Payer: Self-pay | Admitting: Internal Medicine

## 2019-02-22 MED ORDER — ONDANSETRON 4 MG PO TBDP: 4.0000 mg | ORAL_TABLET | Freq: Three times a day (TID) | ORAL | 0 refills | Status: DC | PRN

## 2019-02-22 NOTE — Telephone Encounter (Signed)
Will you send in medication for nausea?

## 2019-02-22 NOTE — Telephone Encounter (Signed)
Sent to [facility].  

## 2019-02-22 NOTE — Telephone Encounter (Signed)
Copied from [place] [phone]. Topic: General - Other >> Feb 22, 2019  8:11 AM Keene Breath wrote: Reason for CRM: Patient's wife called to get the patient medication for nausea.  Please call wife at [phone].

## 2019-02-22 NOTE — Telephone Encounter (Signed)
Wife aware

## 2019-02-22 NOTE — Telephone Encounter (Signed)
Patient had a + COVID on 02/16/19.

## 2019-02-25 ENCOUNTER — Encounter: Payer: Self-pay | Admitting: Internal Medicine

## 2019-02-26 ENCOUNTER — Other Ambulatory Visit: Payer: Self-pay

## 2019-02-26 ENCOUNTER — Emergency Department (HOSPITAL_COMMUNITY): Payer: Medicare Other

## 2019-02-26 ENCOUNTER — Emergency Department (HOSPITAL_COMMUNITY)
Admission: EM | Admit: 2019-02-26 | Discharge: 2019-02-26 | Disposition: A | Discharge status: Home / Self Care | Payer: Medicare Other | Attending: Emergency Medicine | Admitting: Emergency Medicine

## 2019-02-26 ENCOUNTER — Encounter: Payer: Self-pay | Admitting: Internal Medicine

## 2019-02-26 ENCOUNTER — Encounter (HOSPITAL_COMMUNITY): Payer: Self-pay | Admitting: Emergency Medicine

## 2019-02-26 ENCOUNTER — Telehealth (HOSPITAL_COMMUNITY): Payer: Self-pay | Admitting: Emergency Medicine

## 2019-02-26 DIAGNOSIS — Z79899 Other long term (current) drug therapy: Secondary | ICD-10-CM | POA: Diagnosis not present

## 2019-02-26 DIAGNOSIS — U071 COVID-19: Secondary | ICD-10-CM | POA: Diagnosis not present

## 2019-02-26 DIAGNOSIS — Z8551 Personal history of malignant neoplasm of bladder: Secondary | ICD-10-CM | POA: Insufficient documentation

## 2019-02-26 DIAGNOSIS — Z85828 Personal history of other malignant neoplasm of skin: Secondary | ICD-10-CM | POA: Insufficient documentation

## 2019-02-26 DIAGNOSIS — Z8546 Personal history of malignant neoplasm of prostate: Secondary | ICD-10-CM | POA: Insufficient documentation

## 2019-02-26 DIAGNOSIS — R0602 Shortness of breath: Secondary | ICD-10-CM | POA: Diagnosis present

## 2019-02-26 DIAGNOSIS — R05 Cough: Secondary | ICD-10-CM | POA: Insufficient documentation

## 2019-02-26 HISTORY — DX: COVID-19: U07.1

## 2019-02-26 MED ORDER — DEXAMETHASONE 4 MG PO TABS
10.0000 mg | ORAL_TABLET | Freq: Once | ORAL | Status: AC
  Administered 2019-02-26: 10 mg via ORAL
  Filled 2019-02-26: qty 3

## 2019-02-26 MED ORDER — ALBUTEROL SULFATE HFA 108 (90 BASE) MCG/ACT IN AERS
6.0000 | INHALATION_SPRAY | Freq: Once | RESPIRATORY_TRACT | Status: AC
  Administered 2019-02-26: 6 via RESPIRATORY_TRACT
  Filled 2019-02-26: qty 6.7

## 2019-02-26 NOTE — ED Provider Notes (Signed)
[facility] EMERGENCY DEPARTMENT Provider Note   CSN: [id number] Arrival date & time: 02/26/19  1616     History Chief Complaint  Patient presents with   COVID +   Shortness of Breath    Adrian Rios is a 78 y.o. male.  The history is provided by the patient.  Shortness of Breath Severity:  Mild Onset quality:  Gradual Timing:  Intermittent Progression:  Waxing and waning Chronicity:  New Context: URI (covid 11 days ago.)   Relieved by:  Inhaler Worsened by:  Exertion Associated symptoms: cough   Associated symptoms: no abdominal pain, no chest pain, no ear pain, no fever, no rash, no sore throat and no vomiting   Risk factors: no hx of PE/DVT        Past Medical History:  Diagnosis Date   Allergic rhinitis    Arthritis    Benign localized prostatic hyperplasia with lower urinary tract symptoms (LUTS)    Bladder tumor    Borderline glaucoma of right eye    Cancer ([place])    bladder and prostate   Carotid bruit    per duplex 03-11-2016 RICA 1-39%   Chronic throat clearing    COVID-19    DDD (degenerative disc disease), lumbar    Depression    ED (erectile dysfunction)    Elevated PSA    urologist-  dr [name]--- s/p  prostate bx's   GERD (gastroesophageal reflux disease)    Hematuria    History of chronic bronchitis    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    s/p  MOH's nasal --  pre-melanoma   History of squamous cell carcinoma excision    several times   Hyperlipidemia    Pre-diabetes    Premature ventricular contractions (PVCs) (VPCs)    RAD (reactive airway disease)    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  s/p  deep neck lymph node bx's and lung bx's   Sensorineural hearing loss (SNHL) of both ears    Sepsis ([place]) 09/2016   UTI (urinary tract infection) 08/2016   Wears glasses    Wears hearing aid    bilateral    Patient Active Problem List   Diagnosis Date Noted     Dizziness 06/21/2018   Gait disorder 01/24/2018   Left hip pain 11/11/2017   Abdominal wall hernia 05/26/2017   Recurrent urinary tract infection 11/13/2016   Chronic diastolic CHF (congestive heart failure) ([place]) 11/12/2016   Fatigue 08/19/2016   History of bladder cancer 06/18/2016   History of prostate cancer 06/18/2016   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   Carotid bruit 02/17/2016   Prediabetes 08/20/2015   Overweight (BMI 25.0-29.9) 08/20/2015   Esophageal reflux 08/20/2015   Glaucoma 02/14/2015   Varicose veins of lower extremities with other complications [id number]   RAD (reactive airway disease) 10/27/2011   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[place]), 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   PREMATURE VENTRICULAR CONTRACTIONS 04/29/2010   ERECTILE DYSFUNCTION 04/23/2010   [place] DISEASE, LUMBOSACRAL SPINE 02/06/2010   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   Depression 04/12/2008   Allergic rhinitis 04/12/2008   Hyperlipidemia 10/05/2006   Sarcoidosis ([place]) 07/05/2006    Past Surgical History:  Procedure Laterality Date   APPENDECTOMY  2008   CARDIOVASCULAR STRESS TEST  05/27/2010   normal nuclear study w/ no ischemia/  normal LV function and wall motion , ef 60%   CYSTOSCOPY WITH INJECTION N/A 06/12/2016   Procedure: CYSTOSCOPY WITH INJECTION OF INDOCYANINE GREEN  DYE;  Surgeon: Alexis Frock, MD;  Location: [facility];  Service: Urology;  Laterality: N/A;   DEEP NECK LYMPH NODE BIOPSY / EXCISION  1970's   and Bronchoscopy w/ bx's ( dx Sarcoidosis)   ILEOSTOMY     IR NEPHROSTOMY PLACEMENT LEFT  11/18/2016   MOHS SURGERY  2013 approx.    nasal; pre melanoma   PARS PLANA VITRECTOMY Right 2007   repair macular pucker   TONSILLECTOMY AND ADENOIDECTOMY  child   TRANSURETHRAL RESECTION OF BLADDER TUMOR N/A 04/06/2016   Procedure: CYSTOSCOPY TRANSURETHRAL RESECTION OF BLADDER TUMORS (TURBT);  Surgeon: Carolan Clines, MD;  Location: [facility];  Service:  Urology;  Laterality: N/A;   TRANSURETHRAL RESECTION OF PROSTATE         Family History  Problem Relation Age of Onset   Stroke Mother        in her 59s   Breast cancer Sister    Heart disease Maternal Grandmother 31       MI    Breast cancer Paternal Grandmother    Heart disease Paternal Grandfather 25       MI   Stroke Brother    Diabetes Neg Hx     Social History   Tobacco Use   Smoking status: Never Smoker   Smokeless tobacco: Never Used  Substance Use Topics   Alcohol use: No   Drug use: No    Home Medications Prior to Admission medications   Medication Sig Start Date End Date Taking? Authorizing Provider  acetaminophen (TYLENOL) 500 MG tablet Take 500 mg by mouth every 6 (six) hours as needed.    [provider]  ADVAIR DISKUS 250-50 MCG/DOSE AEPB INHALE 1 DOSE BY MOUTH EVERY 12 HOURS 06/27/18   Burns, Claudina Lick, MD  ibuprofen (ADVIL) 200 MG tablet Take 200 mg by mouth every 6 (six) hours as needed.    [provider]  levalbuterol (XOPENEX HFA) 45 MCG/ACT inhaler INHALE TWO PUFFS BY MOUTH INTO THE LUNGS EVERY 6 HOURS AS NEEDED FOR WHEEZING 02/17/19   Burns, Claudina Lick, MD  magnesium oxide (MAG-OX) 400 MG tablet Take 400 mg by mouth daily.    [provider]  meclizine (ANTIVERT) 12.5 MG tablet Take 1-2 tablets (12.5-25 mg total) by mouth 3 (three) times daily as needed for dizziness. 06/21/18   [name], MD  Misc Natural Products (GLUCOSAMINE CHOND DOUBLE STR PO) Take 1 tablet by mouth 2 (two) times daily.    [provider]  montelukast (SINGULAIR) 10 MG tablet Take 1 tablet (10 mg total) by mouth daily. 10/28/18   [name], MD  ondansetron (ZOFRAN ODT) 4 MG disintegrating tablet Take 1 tablet (4 mg total) by mouth every 8 (eight) hours as needed for nausea or vomiting. 02/22/19   [name], MD  triamcinolone (NASACORT ALLERGY 24HR) 55 MCG/ACT AERO nasal inhaler Place 1 spray into the nose at bedtime.     [provider]  venlafaxine (EFFEXOR) 75 MG tablet Take 1 tablet (75 mg total) by mouth daily. 10/28/18   [name], MD    Allergies    Ivp dye [iodinated diagnostic agents], Metrizamide, Shellfish allergy, Gabapentin, Niacin-lovastatin er, and Adhesive [tape]  Review of Systems   Review of Systems  Constitutional: Negative for chills and fever.  HENT: Negative for ear pain and sore throat.   Eyes: Negative for pain and visual disturbance.  Respiratory: Positive for cough and shortness of breath.   Cardiovascular: Negative  for chest pain and palpitations.  Gastrointestinal: Negative for abdominal pain and vomiting.  Genitourinary: Negative for dysuria and hematuria.  Musculoskeletal: Negative for arthralgias and back pain.  Skin: Negative for color change and rash.  Neurological: Negative for seizures and syncope.  All other systems reviewed and are negative.   Physical Exam Updated Vital Signs  ED Triage Vitals  Enc Vitals Group     BP 02/26/19 1623 106/66     Pulse Rate 02/26/19 1623 86     Resp 02/26/19 1623 18     Temp 02/26/19 1623 97.9 F (36.6 C)     Temp Source 02/26/19 1623 Oral     SpO2 02/26/19 1623 94 %     Weight --      Height --      Head Circumference --      Peak Flow --      Pain Score 02/26/19 1824 0     Pain Loc --      Pain Edu? --      Excl. in [place]? --     Physical Exam Vitals and nursing note reviewed.  Constitutional:      General: He is not in acute distress.    Appearance: He is well-developed. He is not ill-appearing.  HENT:     Head: Normocephalic and atraumatic.  Eyes:     Conjunctiva/sclera: Conjunctivae normal.     Pupils: Pupils are equal, round, and reactive to light.  Cardiovascular:     Rate and Rhythm: Normal rate and regular rhythm.     Pulses: Normal pulses.     Heart sounds: Normal heart sounds. No murmur.  Pulmonary:     Effort: Pulmonary effort is normal. No tachypnea or respiratory distress.     Breath  sounds: Wheezing (mild) present. No decreased breath sounds.  Abdominal:     Palpations: Abdomen is soft.     Tenderness: There is no abdominal tenderness.  Musculoskeletal:     Cervical back: Normal range of motion and neck supple.     Right lower leg: No edema.     Left lower leg: No edema.  Skin:    General: Skin is warm and dry.     Capillary Refill: Capillary refill takes less than 2 seconds.  Neurological:     General: No focal deficit present.     Mental Status: He is alert.  Psychiatric:        Mood and Affect: Mood normal.     ED Results / Procedures / Treatments   Labs (all labs ordered are listed, but only abnormal results are displayed) Labs Reviewed - No data to display  EKG None  Radiology DG Chest Portable 1 View  Result Date: 02/26/2019 CLINICAL DATA:  Shortness of breath. Left-sided chest pain. COVID-19. Cough. EXAM: PORTABLE CHEST 1 VIEW COMPARISON:  11/12/2016 FINDINGS: The heart size and mediastinal contours are within normal limits. Both lungs are clear. The visualized skeletal structures are unremarkable. IMPRESSION: No active disease. Electronically Signed   By: Lorriane Shire M.D.   On: 02/26/2019 18:57    Procedures Procedures (including critical care time)  Medications Ordered in ED Medications  dexamethasone (DECADRON) tablet 10 mg (10 mg Oral Given 02/26/19 2012)  albuterol (VENTOLIN HFA) 108 (90 Base) MCG/ACT inhaler 6 puff (6 puffs Inhalation Given 02/26/19 2013)    ED Course  I have reviewed the triage vital signs and the nursing notes.  Pertinent labs & imaging results that were available during my care  of the patient were reviewed by me and considered in my medical decision making (see chart for details).    MDM Rules/Calculators/A&P  Adrian Rios is a 78 year old male with history of sarcoid, chronic bronchitis who presents to the ED with shortness of breath, cough.  Tested positive for coronavirus 11 days ago.  Had infusion  treatment with monoclonal antibody about 5 days ago.  Continues to have cough and some shortness of breath but mostly with exertion and worse at night.  Denies any chest pain.  Patient with normal vitals.  No increased work of breathing.  Normal room air oxygenation.  With ambulation he does cough but oxygen is also normal on room air.  He has some mild wheezing on exam.  But overall patient appears well.  Chest x-ray showed no signs of pneumonia, no inflammation.  Overall no active disease in the chest x-ray.  No risk factors for blood clots at this time as well.  Overall patient appears well and maybe has some mild exacerbation of reactive airway disease.  Recommend increase use of albuterol.  Will give a dose of Decadron.  After albuterol treatment patient feels improved. Believe that he will continue to do well.  Given return precautions and discharged from ED in good condition.  Adrian Rios was evaluated in Emergency Department on 02/26/2019 for the symptoms described in the history of present illness. He was evaluated in the context of the global COVID-19 pandemic, which necessitated consideration that the patient might be at risk for infection with the SARS-CoV-2 virus that causes COVID-19. Institutional protocols and algorithms that pertain to the evaluation of patients at risk for COVID-19 are in a state of rapid change based on information released by regulatory bodies including the CDC and federal and state organizations. These policies and algorithms were followed during the patient's care in the ED.  Final Clinical Impression(s) / ED Diagnoses Final diagnoses:  [id number]    Rx / DC Orders ED Discharge Orders    None       Lennice Sites, DO 02/26/19 2108

## 2019-02-26 NOTE — Telephone Encounter (Signed)
Pt made appt for 430, appt note stated, chest pain, fever cough, breathing difficulties. Called pt and spoke to him by phone, pt very short of breath on the phone, difficulty speaking, has to take deep breaths between each sentence. Pt is positive for covid. Discussed with pt he needs to be seen in the ER due to worsening of symptoms. Pt agreeable to plan, states his wife will take him now.

## 2019-02-26 NOTE — ED Triage Notes (Signed)
Pt states he was diagnosed with COVID 11 days ago and received infusion 5 days ago but not feeling any better.  C/o SOB.

## 2019-02-28 ENCOUNTER — Encounter: Payer: Self-pay | Admitting: Internal Medicine

## 2019-02-28 ENCOUNTER — Ambulatory Visit: Payer: Self-pay | Admitting: *Deleted

## 2019-02-28 ENCOUNTER — Ambulatory Visit (INDEPENDENT_AMBULATORY_CARE_PROVIDER_SITE_OTHER): Payer: Medicare Other | Admitting: Internal Medicine

## 2019-02-28 DIAGNOSIS — U071 COVID-19: Secondary | ICD-10-CM | POA: Diagnosis not present

## 2019-02-28 MED ORDER — PREDNISONE 20 MG PO TABS: 40.0000 mg | ORAL_TABLET | Freq: Every day | ORAL | 0 refills | Status: DC

## 2019-02-28 NOTE — Assessment & Plan Note (Signed)
Rx prednisone. If no improvement or worsening needs to be seen in respiratory clinic.

## 2019-02-28 NOTE — Progress Notes (Signed)
Virtual Visit via Video Note  I connected with Adrian Rios on 02/28/19 at  2:40 PM EST by a video enabled telemedicine application and verified that I am speaking with the correct person using two identifiers.  The patient and the provider were at separate locations throughout the entire encounter.   I discussed the limitations of evaluation and management by telemedicine and the availability of in person appointments. The patient expressed understanding and agreed to proceed. The patient and the provider were the only parties present for the visit unless noted in HPI below.  History of Present Illness: The patient is a 78 y.o. man with visit for covid-19 positive and SOB and cough. Started about 12 days ago now. Diagnosed on 02/16/19 with testing for covid-19 positive. He has had body aches, fevers, cough, SOB since that time. Has underlying lung disease from prior sarcoidosis and takes advair BID currently. He has levalbuterol inhaler at home to use which he has been using regularly since this illness started. Does okay with getting around the house with SOB. Worse at night time with the cough. He is still running some fevers low grade in the evening. Does not have oxygen monitor. Did antibody infusion after diagnosis due to risk factors. ER visit 2 days ago with okay oxygen levels and no pneumonia on CXR. Denies worsening but not improving much. Overall it is stable. Has tried as above. Smell is gone. Sleeping okay most of the time but sometimes cannot get comfortable. Eating okay. Diarrhea the first week, this is not completely back to normal but improving.   PMH, [place], social history reviewed and updated  Observations/Objective: Appearance: normal, some coughing during visit, breathing appears normal, no dyspnea with talking, casual grooming, abdomen does not appear distended, throat normal, memory normal, mental status is A and O times 3  Assessment and Plan: See problem oriented  charting  Follow Up Instructions: rx prednisone and if no improvement or worsening needs visit with respiratory clinic.   Visit time 25 minutes: greater than 50% of that time was spent in face to face counseling and coordination of care with the patient: counseled about as above  I discussed the assessment and treatment plan with the patient. The patient was provided an opportunity to ask questions and all were answered. The patient agreed with the plan and demonstrated an understanding of the instructions.   The patient was advised to call back or seek an in-person evaluation if the symptoms worsen or if the condition fails to improve as anticipated.  Hoyt Koch, MD

## 2019-02-28 NOTE — Telephone Encounter (Signed)
Noted  

## 2019-02-28 NOTE — Telephone Encounter (Signed)
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  Reason for Disposition . Patient sounds very sick or weak to the triager  Answer Assessment - Initial Assessment Questions 1. RESPIRATORY STATUS: "Describe your breathing?" (e.g., wheezing, shortness of breath, unable to speak, severe coughing)      Shortness of breath and chest tightness 2. ONSET: "When did this breathing problem begin?"   ongoing for last 13 days 3. PATTERN "Does the difficult breathing come and go, or has it been constant since it started?"      4. SEVERITY: "How bad is your breathing?" (e.g., mild, moderate, severe)    - MILD: No SOB at rest, mild SOB with walking, speaks normally in sentences, can lay down, no retractions, pulse < 100.    - MODERATE: SOB at rest, SOB with minimal exertion and prefers to sit, cannot lie down flat, speaks in phrases, mild retractions, audible wheezing, pulse 100-120.    - SEVERE: Very SOB at rest, speaks in single words, struggling to breathe, sitting hunched forward, retractions, pulse > 120      moderate 5. RECURRENT SYMPTOM: "Have you had difficulty breathing before?" If so, ask: "When was the last time?" and "What happened that time?"       6. CARDIAC HISTORY: "Do you have any history of heart disease?" (e.g., heart attack, angina, bypass surgery, angioplasty)     7. LUNG HISTORY: "Do you have any history of lung disease?"  (e.g., pulmonary embolus, asthma, emphysema)     sarcoidosisi 8. CAUSE: "What do you think is causing the breathing problem?"    Pt is +COVID 9. OTHER SYMPTOMS: "Do you have any other symptoms? (e.g., dizziness, runny nose, cough, chest pain, fever)      10. PREGNANCY: "Is there any chance you are pregnant?" "When was your last menstrual period?"       n/a 11. TRAVEL: "Have you traveled out of the country in the last month?" (e.g., travel history, exposures)  Protocols used: BREATHING DIFFICULTY-A-AH

## 2019-03-01 ENCOUNTER — Ambulatory Visit: Payer: Medicare Other | Admitting: Internal Medicine

## 2019-03-03 ENCOUNTER — Encounter: Payer: Self-pay | Admitting: Internal Medicine

## 2019-03-10 ENCOUNTER — Ambulatory Visit: Payer: Medicare Other | Admitting: Internal Medicine

## 2019-03-10 ENCOUNTER — Ambulatory Visit (HOSPITAL_COMMUNITY)
Admission: EM | Admit: 2019-03-10 | Discharge: 2019-03-10 | Disposition: A | Discharge status: Home / Self Care | Payer: Medicare Other | Attending: Family Medicine | Admitting: Family Medicine

## 2019-03-10 ENCOUNTER — Other Ambulatory Visit: Payer: Self-pay

## 2019-03-10 ENCOUNTER — Encounter (HOSPITAL_COMMUNITY): Payer: Self-pay

## 2019-03-10 DIAGNOSIS — L03012 Cellulitis of left finger: Secondary | ICD-10-CM

## 2019-03-10 DIAGNOSIS — R05 Cough: Secondary | ICD-10-CM | POA: Diagnosis not present

## 2019-03-10 DIAGNOSIS — R058 Other specified cough: Secondary | ICD-10-CM

## 2019-03-10 DIAGNOSIS — Z8616 Personal history of COVID-19: Secondary | ICD-10-CM | POA: Diagnosis not present

## 2019-03-10 DIAGNOSIS — Z20822 Contact with and (suspected) exposure to covid-19: Secondary | ICD-10-CM

## 2019-03-10 MED ORDER — HYDROCODONE-HOMATROPINE 5-1.5 MG/5ML PO SYRP: 5.0000 mL | ORAL_SOLUTION | Freq: Four times a day (QID) | ORAL | 0 refills | Status: DC | PRN

## 2019-03-10 MED ORDER — DOXYCYCLINE HYCLATE 100 MG PO TABS: 100.0000 mg | ORAL_TABLET | Freq: Two times a day (BID) | ORAL | 0 refills | Status: DC

## 2019-03-10 NOTE — ED Provider Notes (Signed)
[place]    CSN: [id number] Arrival date & time: 03/10/19  1426      History   Chief Complaint Chief Complaint  Patient presents with  . Finger infection    HPI Adrian Rios is a 79 y.o. male.   This is a 79 year old man making his initial [place] urgent care visit for evaluation of finger infection.  He was tested positive for Covid back in mid December and was reevaluated at the end of December and put on dexamethasone.  Left thumb tenderness began 2 days ago.  No injury, just pain, redness at cuticle edge.  FROM  Still coughing during the day and would like some cough med.  No shortness of breath or fever now.     Past Medical History:  Diagnosis Date  . Allergic rhinitis   . Arthritis   . Benign localized prostatic hyperplasia with lower urinary tract symptoms (LUTS)   . Bladder tumor   . Borderline glaucoma of right eye   . Cancer [facility])    bladder and prostate  . Carotid bruit    per duplex 03-11-2016 RICA 1-39%  . Chronic throat clearing   . COVID-19   . DDD (degenerative disc disease), lumbar   . Depression   . ED (erectile dysfunction)   . Elevated PSA    urologist-  dr [name]--- s/p  prostate bx's  . GERD (gastroesophageal reflux disease)   . Hematuria   . History of chronic bronchitis   . 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 History of squamous cell carcinoma excision    several times  . Hyperlipidemia   . Pre-diabetes   . Premature ventricular contractions (PVCs) (VPCs)   . RAD (reactive airway disease)   . 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 Sensorineural hearing loss (SNHL) of both ears   . Sepsis ([place]) 09/2016  . UTI (urinary tract infection) 08/2016  . Wears glasses   . Wears hearing aid    bilateral    Patient Active Problem List   Diagnosis Date Noted  . COVID-19 02/28/2019  . Dizziness  06/21/2018  . Gait disorder 01/24/2018  . Left hip pain 11/11/2017  . Abdominal wall hernia 05/26/2017  . Recurrent urinary tract infection 11/13/2016  . Chronic diastolic CHF (congestive heart failure) ([place]) 11/12/2016  . Fatigue 08/19/2016  . History of bladder cancer 06/18/2016  . History of prostate cancer 06/18/2016  . 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 Carotid bruit 02/17/2016  . Prediabetes 08/20/2015  . Overweight (BMI 25.0-29.9) 08/20/2015  . Esophageal reflux 08/20/2015  . Glaucoma 02/14/2015  . Varicose veins of lower extremities with other complications [id number]  . RAD (reactive airway disease) 10/27/2011  . 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[place]), 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  . 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 ERECTILE DYSFUNCTION 04/23/2010  . [place] DISEASE, LUMBOSACRAL SPINE 02/06/2010  . 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 Depression 04/12/2008  . Allergic rhinitis 04/12/2008  . Hyperlipidemia 10/05/2006  . Sarcoidosis ([place]) 07/05/2006    Past Surgical History:  Procedure Laterality Date  . APPENDECTOMY  2008  . CARDIOVASCULAR STRESS TEST  05/27/2010   normal nuclear study w/ no ischemia/  normal LV function and wall motion , ef 60%  . CYSTOSCOPY WITH INJECTION N/A 06/12/2016   Procedure: CYSTOSCOPY WITH INJECTION OF INDOCYANINE GREEN DYE;  Surgeon: Alexis Frock, MD;  Location: [facility];  Service: Urology;  Laterality: N/A;  . DEEP NECK LYMPH NODE BIOPSY / EXCISION  1970's   and Bronchoscopy w/ bx's ( dx Sarcoidosis)  . ILEOSTOMY    . IR NEPHROSTOMY PLACEMENT LEFT  11/18/2016  . MOHS SURGERY  2013 approx.    nasal; pre melanoma  . PARS PLANA VITRECTOMY Right 2007   repair macular pucker  . TONSILLECTOMY AND ADENOIDECTOMY  child  . TRANSURETHRAL RESECTION OF BLADDER TUMOR N/A 04/06/2016   Procedure: CYSTOSCOPY TRANSURETHRAL RESECTION OF BLADDER TUMORS (TURBT);  Surgeon: Carolan Clines, MD;  [facility];  Service: Urology;   Laterality: N/A;  . TRANSURETHRAL RESECTION OF PROSTATE         Home Medications    Prior to Admission medications   Medication Sig Start Date End Date Taking? Authorizing Provider  acetaminophen (TYLENOL) 500 MG tablet Take 500 mg by mouth every 6 (six) hours as needed.    [provider]  ADVAIR DISKUS 250-50 MCG/DOSE AEPB INHALE 1 DOSE BY MOUTH EVERY 12 HOURS 06/27/18   Burns, Claudina Lick, MD  doxycycline (VIBRA-TABS) 100 MG tablet Take 1 tablet (100 mg total) by mouth 2 (two) times daily. 03/10/19   Robyn Haber, MD  HYDROcodone-homatropine (HYDROMET) 5-1.5 MG/5ML syrup Take 5 mLs by mouth every 6 (six) hours as needed for cough. 03/10/19   Robyn Haber, MD  ibuprofen (ADVIL) 200 MG tablet Take 200 mg by mouth every 6 (six) hours as needed.    [provider]  levalbuterol (XOPENEX HFA) 45 MCG/ACT inhaler INHALE TWO PUFFS BY MOUTH INTO THE LUNGS EVERY 6 HOURS AS NEEDED FOR WHEEZING 02/17/19   Burns, Claudina Lick, MD  magnesium oxide (MAG-OX) 400 MG tablet Take 400 mg by mouth daily.    [provider]  meclizine (ANTIVERT) 12.5 MG tablet Take 1-2 tablets (12.5-25 mg total) by mouth 3 (three) times daily as needed for dizziness. 06/21/18   [name], MD  Misc Natural Products (GLUCOSAMINE CHOND DOUBLE STR PO) Take 1 tablet by mouth 2 (two) times daily.    [provider]  montelukast (SINGULAIR) 10 MG tablet Take 1 tablet (10 mg total) by mouth daily. 10/28/18   [name], MD  predniSONE (DELTASONE) 20 MG tablet Take 2 tablets (40 mg total) by mouth daily with breakfast. 02/28/19   Hoyt Koch, MD  triamcinolone (NASACORT ALLERGY 24HR) 55 MCG/ACT AERO nasal inhaler Place 1 spray into the nose at bedtime.    [provider]  venlafaxine (EFFEXOR) 75 MG tablet Take 1 tablet (75 mg total) by mouth daily. 10/28/18   [name], MD    Family History Family History  Problem Relation Age of Onset  . Stroke Mother        in her 8s    . Breast cancer Sister   . Heart disease Maternal Grandmother 84       MI   . Breast cancer Paternal Grandmother   . Heart disease Paternal Grandfather 10       MI  . Stroke Brother   . Healthy Father   . Diabetes Neg Hx     Social History Social History   Tobacco Use  . Smoking status: Never Smoker  . Smokeless tobacco: Never Used  Substance Use Topics  . Alcohol use: No  . Drug use: No     Allergies   Ivp dye [iodinated diagnostic agents], Metrizamide, Shellfish allergy, Gabapentin, Niacin-lovastatin  er, and Adhesive [tape]   Review of Systems Review of Systems   Physical Exam Triage Vital Signs ED Triage Vitals  Enc Vitals Group     BP 03/10/19 1447 (!) 101/47     Pulse Rate 03/10/19 1447 93     Resp 03/10/19 1447 18     Temp 03/10/19 1447 98.8 F (37.1 C)     Temp Source 03/10/19 1447 Oral     SpO2 03/10/19 1447 93 %     Weight 03/10/19 1442 170 lb (77.1 kg)     Height --      Head Circumference --      Peak Flow --      Pain Score 03/10/19 1442 3     Pain Loc --      Pain Edu? --      Excl. in [place]? --    No data found.  Updated Vital Signs BP (!) 101/47 (BP Location: Left Arm)   Pulse 93   Temp 98.8 F (37.1 C) (Oral)   Resp 18   Wt 77.1 kg   SpO2 93%   BMI 26.63 kg/m    Physical Exam Vitals and nursing note reviewed.  Constitutional:      Appearance: Normal appearance. He is normal weight.  Cardiovascular:     Rate and Rhythm: Normal rate.  Pulmonary:     Effort: Pulmonary effort is normal.  Musculoskeletal:        General: Tenderness present. Normal range of motion.     Cervical back: Normal range of motion and neck supple.  Skin:    General: Skin is warm and dry.     Comments: Mild erythema at base of left thumb cuticle without fluctuance or blistering.  Neurological:     General: No focal deficit present.     Mental Status: He is alert.  Psychiatric:        Mood and Affect: Mood normal.        Thought Content: Thought  content normal.        Judgment: Judgment normal.      UC Treatments / Results  Labs (all labs ordered are listed, but only abnormal results are displayed) Labs Reviewed - No data to display  EKG   Radiology No results found.  Procedures Procedures (including critical care time)  Medications Ordered in UC Medications - No data to display  Initial Impression / Assessment and Plan / UC Course  I have reviewed the triage vital signs and the nursing notes.  Pertinent labs & imaging results that were available during my care of the patient were reviewed by me and considered in my medical decision making (see chart for details).    Final Clinical Impressions(s) / UC Diagnoses   Final diagnoses:  Paronychia of left thumb  Cough with exposure to COVID-19 virus     Discharge Instructions     Covid and other infections are also eradicated with help from   Vitamin D3 5000 IU (125 mcg) daily  Vitamin C 500 mg twice a day    ED Prescriptions    Medication Sig Dispense Auth. Provider   doxycycline (VIBRA-TABS) 100 MG tablet Take 1 tablet (100 mg total) by mouth 2 (two) times daily. 14 tablet Robyn Haber, MD   HYDROcodone-homatropine (HYDROMET) 5-1.5 MG/5ML syrup Take 5 mLs by mouth every 6 (six) hours as needed for cough. 100 mL Robyn Haber, MD     I have reviewed the PDMP during this encounter.  Robyn Haber, MD 03/10/19 [phone]

## 2019-03-10 NOTE — ED Triage Notes (Signed)
Pt. States his left thumb has an infection, states it does NOT hurt unless he mashs it. His Dr. Leotis Shames him to come here, this started 2 days ago.

## 2019-03-10 NOTE — Discharge Instructions (Addendum)
Covid and other infections are also eradicated with help from   Vitamin D3 5000 IU (125 mcg) daily  Vitamin C 500 mg twice a day

## 2019-03-29 ENCOUNTER — Encounter: Payer: Self-pay | Admitting: Internal Medicine

## 2019-04-28 ENCOUNTER — Encounter: Payer: Self-pay | Admitting: Internal Medicine

## 2019-04-30 ENCOUNTER — Other Ambulatory Visit: Payer: Self-pay | Admitting: Internal Medicine

## 2019-05-03 ENCOUNTER — Other Ambulatory Visit: Payer: Self-pay | Admitting: Internal Medicine

## 2019-05-10 DIAGNOSIS — N1330 Unspecified hydronephrosis: Secondary | ICD-10-CM | POA: Diagnosis not present

## 2019-05-10 DIAGNOSIS — R918 Other nonspecific abnormal finding of lung field: Secondary | ICD-10-CM | POA: Diagnosis not present

## 2019-05-10 DIAGNOSIS — Z906 Acquired absence of other parts of urinary tract: Secondary | ICD-10-CM | POA: Diagnosis not present

## 2019-05-10 DIAGNOSIS — C678 Malignant neoplasm of overlapping sites of bladder: Secondary | ICD-10-CM | POA: Diagnosis not present

## 2019-05-10 DIAGNOSIS — C61 Malignant neoplasm of prostate: Secondary | ICD-10-CM | POA: Diagnosis not present

## 2019-05-10 DIAGNOSIS — N134 Hydroureter: Secondary | ICD-10-CM | POA: Diagnosis not present

## 2019-05-16 DIAGNOSIS — L570 Actinic keratosis: Secondary | ICD-10-CM | POA: Diagnosis not present

## 2019-05-18 ENCOUNTER — Telehealth: Payer: Self-pay

## 2019-05-18 NOTE — Telephone Encounter (Signed)
New message   The patient had an infusion done on 12.22.20 wanted to know when can he get the COVID vaccine.

## 2019-05-18 NOTE — Telephone Encounter (Signed)
Yes, it has been about 90 days and that is the recommendation.

## 2019-05-19 NOTE — Telephone Encounter (Signed)
Pt aware of response.  

## 2019-05-26 ENCOUNTER — Telehealth: Payer: Self-pay

## 2019-05-26 NOTE — Telephone Encounter (Signed)
It could be related to drainage, silent reflux or a vocal cord problem.  If it does not improve it should be evaluated further

## 2019-05-26 NOTE — Telephone Encounter (Signed)
Patient calling and states that his voice has been changing, that he has been having some hoarseness x 1 month. States that he is not experiencing any fever or cough. Denies sore throat. States that he feels fine, nothing out of the ordinary except the hoarseness. Would like to know if this is something he should be concerned about? Offered appointment, declines at this time.

## 2019-05-26 NOTE — Telephone Encounter (Signed)
Pt aware of response below. States he will give this through the weekend and call back next week if he feels like he needs to be seen.

## 2019-06-07 ENCOUNTER — Telehealth: Payer: Self-pay | Admitting: Internal Medicine

## 2019-06-07 MED ORDER — ADVAIR DISKUS 250-50 MCG/DOSE IN AEPB: 1.0000 | INHALATION_SPRAY | Freq: Two times a day (BID) | RESPIRATORY_TRACT | 5 refills | Status: DC

## 2019-06-07 NOTE — Telephone Encounter (Signed)
Resent to [facility]../lmb 

## 2019-06-07 NOTE — Telephone Encounter (Addendum)
    Please resend Advair to [facility] per patient request

## 2019-06-07 NOTE — Telephone Encounter (Signed)
° ° °  1.Medication Requested: ADVAIR DISKUS 250-50 MCG/DOSE AEPB  2. Pharmacy (Name, Street, City):[facility] [phone] - [place], [address]  3. On Med List: yes  4. Last Visit with PCP: 10/2018  5. Next visit date with PCP:   Agent: Please be advised that RX refills may take up to 3 business days. We ask that you follow-up with your pharmacy.

## 2019-06-07 NOTE — Addendum Note (Signed)
Addended by: Earnstine Regal on: 06/07/2019 03:22 PM   Modules accepted: Orders

## 2019-07-29 ENCOUNTER — Other Ambulatory Visit: Payer: Self-pay | Admitting: Internal Medicine

## 2019-08-03 ENCOUNTER — Other Ambulatory Visit: Payer: Self-pay | Admitting: Internal Medicine

## 2019-08-04 ENCOUNTER — Encounter: Payer: Self-pay | Admitting: Internal Medicine

## 2019-08-04 ENCOUNTER — Ambulatory Visit (INDEPENDENT_AMBULATORY_CARE_PROVIDER_SITE_OTHER): Payer: Medicare Other | Admitting: Internal Medicine

## 2019-08-04 ENCOUNTER — Other Ambulatory Visit: Payer: Self-pay

## 2019-08-04 VITALS — BP 120/78 | HR 74 | Temp 98.7°F | Resp 16 | Ht 67.0 in | Wt 175.0 lb

## 2019-08-04 DIAGNOSIS — G8929 Other chronic pain: Secondary | ICD-10-CM | POA: Insufficient documentation

## 2019-08-04 DIAGNOSIS — M25511 Pain in right shoulder: Secondary | ICD-10-CM | POA: Insufficient documentation

## 2019-08-04 DIAGNOSIS — M25512 Pain in left shoulder: Secondary | ICD-10-CM

## 2019-08-04 MED ORDER — VENLAFAXINE HCL 75 MG PO TABS: 75.0000 mg | ORAL_TABLET | Freq: Every day | ORAL | 1 refills | Status: DC

## 2019-08-04 NOTE — Patient Instructions (Signed)
Schedule an appointment with sports medicine downstairs for your bilateral shoulder pain.

## 2019-08-04 NOTE — Progress Notes (Signed)
Subjective:    Patient ID: Adrian Rios, male    DOB: 08-Aug-1940, 79 y.o.   MRN: [id number]  HPI The patient is here for an acute visit.  He is here because of bilateral shoulder pain.  The shoulder pain is chronic, but it has gotten worse.  He has fairly good range of motion, but with doing anything with the arms he does have pain.  He does not have pain at rest or with sleeping.  He typically takes Tylenol or Advil 2-3 times a day and that does help.  He feels the left shoulder is worse than the right and especially with twisting or movements.  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  He is not interested in surgery, but is interested in other treatments if possible.   He denies any shoulder joint swelling, numbness or tingling.  Occasionally the pain radiates from his left shoulder down his upper arm.  Medications and allergies reviewed with patient and updated if appropriate.  Patient Active Problem List   Diagnosis Date Noted  . COVID-19 02/28/2019  . Dizziness 06/21/2018  . Gait disorder 01/24/2018  . Left hip pain 11/11/2017  . Abdominal wall hernia 05/26/2017  . Recurrent urinary tract infection 11/13/2016  . Chronic diastolic CHF (congestive heart failure) ([place]) 11/12/2016  . Fatigue 08/19/2016  . History of bladder cancer 06/18/2016  . History of prostate cancer 06/18/2016  . 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 Carotid bruit 02/17/2016  . Prediabetes 08/20/2015  . Overweight (BMI 25.0-29.9) 08/20/2015  . Esophageal reflux 08/20/2015  . Glaucoma 02/14/2015  . Varicose veins of lower extremities with other complications 37/34/2876  . RAD (reactive airway disease) 10/27/2011  . 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[place]), 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  . 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 ERECTILE DYSFUNCTION 04/23/2010  . [place] DISEASE, LUMBOSACRAL SPINE 02/06/2010  . 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  Depression 04/12/2008  . Allergic rhinitis 04/12/2008  . Hyperlipidemia 10/05/2006  . Sarcoidosis ([place]) 07/05/2006    Current Outpatient Medications on File Prior to Visit  Medication Sig Dispense Refill  . acetaminophen (TYLENOL) 500 MG tablet Take 500 mg by mouth every 6 (six) hours as needed.    Marland Kitchen ADVAIR DISKUS 250-50 MCG/DOSE AEPB Inhale 1 puff into the lungs every 12 (twelve) hours. Annual appt due in August must see provider for future refills 60 each 5  . ibuprofen (ADVIL) 200 MG tablet Take 200 mg by mouth every 6 (six) hours as needed.    . levalbuterol (XOPENEX HFA) 45 MCG/ACT inhaler INHALE TWO PUFFS BY MOUTH INTO THE LUNGS EVERY 6 HOURS AS NEEDED FOR WHEEZING 15 g 0  . magnesium oxide (MAG-OX) 400 MG tablet Take 400 mg by mouth daily.    . Misc Natural Products (GLUCOSAMINE CHOND DOUBLE STR PO) Take 1 tablet by mouth 2 (two) times daily.    . montelukast (SINGULAIR) 10 MG tablet TAKE ONE TABLET BY MOUTH DAILY 90 tablet 0  . triamcinolone (NASACORT ALLERGY 24HR) 55 MCG/ACT AERO nasal inhaler Place 1 spray into the nose at bedtime.    Marland Kitchen venlafaxine (EFFEXOR) 75 MG tablet TAKE ONE TABLET BY MOUTH DAILY 90 tablet 0   No current facility-administered medications on file prior to visit.    Past Medical History:  Diagnosis Date  . Allergic rhinitis   . Arthritis   . Benign localized prostatic hyperplasia with lower urinary tract symptoms (LUTS)   .  Bladder tumor   . Borderline glaucoma of right eye   . Cancer [facility])    bladder and prostate  . Carotid bruit    per duplex 03-11-2016 RICA 1-39%  . Chronic throat clearing   . COVID-19   . DDD (degenerative disc disease), lumbar   . Depression   . ED (erectile dysfunction)   . Elevated PSA    urologist-  dr [name]--- s/p  prostate bx's  . GERD (gastroesophageal reflux disease)   . Hematuria   . History of chronic bronchitis   . 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 History of squamous cell  carcinoma excision    several times  . Hyperlipidemia   . Pre-diabetes   . Premature ventricular contractions (PVCs) (VPCs)   . RAD (reactive airway disease)   . 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 Sensorineural hearing loss (SNHL) of both ears   . Sepsis ([place]) 09/2016  . UTI (urinary tract infection) 08/2016  . Wears glasses   . Wears hearing aid    bilateral    Past Surgical History:  Procedure Laterality Date  . APPENDECTOMY  2008  . CARDIOVASCULAR STRESS TEST  05/27/2010   normal nuclear study w/ no ischemia/  normal LV function and wall motion , ef 60%  . CYSTOSCOPY WITH INJECTION N/A 06/12/2016   Procedure: CYSTOSCOPY WITH INJECTION OF INDOCYANINE GREEN DYE;  Surgeon: Alexis Frock, MD;  Location: [facility];  Service: Urology;  Laterality: N/A;  . DEEP NECK LYMPH NODE BIOPSY / EXCISION  1970's   and Bronchoscopy w/ bx's ( dx Sarcoidosis)  . ILEOSTOMY    . IR NEPHROSTOMY PLACEMENT LEFT  11/18/2016  . MOHS SURGERY  2013 approx.    nasal; pre melanoma  . PARS PLANA VITRECTOMY Right 2007   repair macular pucker  . TONSILLECTOMY AND ADENOIDECTOMY  child  . TRANSURETHRAL RESECTION OF BLADDER TUMOR N/A 04/06/2016   Procedure: CYSTOSCOPY TRANSURETHRAL RESECTION OF BLADDER TUMORS (TURBT);  Surgeon: Carolan Clines, MD;  Location: [facility];  Service: Urology;  Laterality: N/A;  . TRANSURETHRAL RESECTION OF PROSTATE      Social History   Socioeconomic History  . Marital status: Married    Spouse name: Not on file  . Number of children: Not on file  . Years of education: Not on file  . Highest education level: Not on file  Occupational History  . Not on file  Tobacco Use  . Smoking status: Never Smoker  . Smokeless tobacco: Never Used  Substance and Sexual Activity  . Alcohol use: No  . Drug use: No  . Sexual activity: Not Currently  Other Topics Concern  . Not on file    Social History Narrative  . Not on file   Social Determinants of Health   Financial Resource Strain:   . Difficulty of Paying Living Expenses:   Food Insecurity:   . Worried About Charity fundraiser in the Last Year:   . Arboriculturist in the Last Year:   Transportation Needs:   . Film/video editor (Medical):   Marland Kitchen Lack of Transportation (Non-Medical):   Physical Activity:   . Days of Exercise per Week:   . Minutes of Exercise per Session:   Stress:   . Feeling of Stress :   Social Connections:   .  Frequency of Communication with Friends and Family:   . Frequency of Social Gatherings with Friends and Family:   . Attends Religious Services:   . Active Member of Clubs or Organizations:   . Attends Archivist Meetings:   Marland Kitchen Marital Status:     Family History  Problem Relation Age of Onset  . Stroke Mother        in her 74s  . Breast cancer Sister   . Heart disease Maternal Grandmother 84       MI   . Breast cancer Paternal Grandmother   . Heart disease Paternal Grandfather 19       MI  . Stroke Brother   . Healthy Father   . Diabetes Neg Hx     Review of Systems Per HPI    Objective:   Vitals:   08/04/19 1541  BP: 120/78  Pulse: 74  Resp: 16  Temp: 98.7 F (37.1 C)  SpO2: 95%   BP Readings from Last 3 Encounters:  08/04/19 120/78  03/10/19 (!) 101/47  02/26/19 138/71   Wt Readings from Last 3 Encounters:  08/04/19 175 lb (79.4 kg)  03/10/19 170 lb (77.1 kg)  10/28/18 171 lb 12.8 oz (77.9 kg)   Body mass index is 27.41 kg/m.   Physical Exam    A bilateral shoulder exam was performed.   SWELLING: none  EFFUSION: no  WARMTH: no warmth  TENDERNESS: no tenderness on throughout either shoulder joint  ROM: Minimally reduced range of motion but there is pain with movement, especially certain movements.   NEUROLOGICAL EXAM: normal sensation and strength  PULSES: normal        Assessment & Plan:    See Problem List for  Assessment and Plan of chronic medical problems.    This visit occurred during the SARS-CoV-2 public health emergency.  Safety protocols were in place, including screening questions prior to the visit, additional usage of staff PPE, and extensive cleaning of exam room while observing appropriate contact time as indicated for disinfecting solutions.

## 2019-08-04 NOTE — Assessment & Plan Note (Signed)
Chronic in nature Has been taking Tylenol or Advil a few times a day, which helps Fairly good range of motion, but pain with movement Would be interested in injections Would not want to consider surgery ?  Arthritis versus arthritis + rotator cuff issues Will refer to sports medicine for further evaluation and treatment

## 2019-08-09 ENCOUNTER — Other Ambulatory Visit: Payer: Self-pay

## 2019-08-09 ENCOUNTER — Ambulatory Visit (INDEPENDENT_AMBULATORY_CARE_PROVIDER_SITE_OTHER): Payer: Medicare Other

## 2019-08-09 ENCOUNTER — Ambulatory Visit (INDEPENDENT_AMBULATORY_CARE_PROVIDER_SITE_OTHER): Payer: Medicare Other | Admitting: Family Medicine

## 2019-08-09 ENCOUNTER — Encounter: Payer: Self-pay | Admitting: Family Medicine

## 2019-08-09 VITALS — BP 120/70 | HR 73 | Ht 67.0 in | Wt 176.0 lb

## 2019-08-09 DIAGNOSIS — M25511 Pain in right shoulder: Secondary | ICD-10-CM

## 2019-08-09 DIAGNOSIS — M25512 Pain in left shoulder: Secondary | ICD-10-CM | POA: Diagnosis not present

## 2019-08-09 DIAGNOSIS — M19011 Primary osteoarthritis, right shoulder: Secondary | ICD-10-CM | POA: Diagnosis not present

## 2019-08-09 DIAGNOSIS — M19012 Primary osteoarthritis, left shoulder: Secondary | ICD-10-CM | POA: Diagnosis not present

## 2019-08-09 NOTE — Progress Notes (Signed)
Subjective:    CC: B shoulder pain  I, Adrian Rios, am serving as a scribe for Dr. [name].  HPI: Pt is a 79 y/o male presenting w/ c/o chronic B shoulder pain, L >R. Locates at the joint and radiates toward the back not the front. 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  No fevers or chills.  Radiating pain: to the back  Shoulder mechanical symptoms: popping Aggravating factors: holding arm out and twisting to the left  Treatments tried: Tylenol; Advil   Pertinent review of Systems: No fevers or chills  Relevant historical information: History bladder cancer status post cystectomy   Objective:    Vitals:   08/09/19 1316  BP: 120/70  Pulse: 73  SpO2: 94%   General: Well Developed, well nourished, and in no acute distress.   MSK: C-spine normal-appearing nontender normal motion. Left shoulder normal-appearing Nontender. Range of motion abduction limited to 110 degrees.  External rotation full.  Internal rotation to lumbar spine. Strength 4/5 abduction 4/5 external rotation 5/5 internal rotation. Positive Hawkins and Neer's test. Negative Yergason's and speeds test.  Right shoulder normal-appearing Nontender. Range of motion abduction 130 degrees full external rotation.  Internal rotation lumbar spine. Strength intact throughout. Positive Hawkins and Neer's test. Negative Yergason's and speeds test.  Lab and Radiology Results X-ray images bilateral shoulder obtained today personally and independently reviewed  Right shoulder: Mild glenohumeral DJD.  Mild to moderate AC DJD.  No acute fractures  Left shoulder: Minimal glenohumeral DJD.  Mild to moderate AC DJD.  No acute fractures.  Await formal radiology review  Diagnostic Limited MSK Ultrasound of: Left shoulder Biceps tendon intact normal-appearing in bicipital groove. Subscapularis tendon is intact with hypoechoic fluid tracking superficial to the subscapularis tendon at superior  portion. Supraspinatus tendon is intact with increased thickness of subacromial bursa. Infraspinatus tendon is intact. AC joint narrowed and degenerative with effusion Impression: Subacromial bursitis and AC DJD  Diagnostic Limited MSK Ultrasound of: Right shoulder Biceps tendon intact normal-appearing in bicipital groove. Subscapularis tendon is intact normal-appearing Supraspinatus tendon is intact with slight increased subacromial bursa thickness. Infraspinatus tendon normal-appearing AC narrowed degenerative with effusion Impression: Subacromial bursitis with AC DJD   Procedure: Real-time Ultrasound Guided Injection of left shoulder subacromial bursa Device: Philips Affiniti 50G Images permanently stored and available for review in the ultrasound unit. Verbal informed consent obtained.  Discussed risks and benefits of procedure. Warned about infection bleeding damage to structures skin hypopigmentation and fat atrophy among others. Patient expresses understanding and agreement Time-out conducted.   Noted no overlying erythema, induration, or other signs of local infection.   Skin prepped in a sterile fashion.   Local anesthesia: Topical Ethyl chloride.   With sterile technique and under real time ultrasound guidance:  40 mg of Kenalog and 2 mL of Marcaine injected easily.   Completed without difficulty   Pain immediately resolved suggesting accurate placement of the medication.   Advised to call if fevers/chills, erythema, induration, drainage, or persistent bleeding.   Images permanently stored and available for review in the ultrasound unit.  Impression: Technically successful ultrasound guided injection.       Impression and Recommendations:    Assessment and Plan: 79 y.o. male with bilateral shoulder pain left worse than right.  Patient has evidence of impingement and bursitis and rotator cuff tendinopathy on exam today left worse than right.  X-ray does also show  minimal glenohumeral DJD however radiology overread is pending. Patient had moderate  improvement following subacromial injection in clinic today indicating that is certainly a pain generator.  Plan for trial of physical therapy and recheck back in 4 to 6 weeks.  Could proceed with further injections if needed.  PDMP not reviewed this encounter. Orders Placed This Encounter  Procedures  . [place] LIMITED JOINT SPACE STRUCTURES UP LEFT    Standing Status:   Future    Number of Occurrences:   1    Standing Expiration Date:   08/08/2020    Order Specific Question:   Reason for Exam (SYMPTOM  OR DIAGNOSIS REQUIRED)    Answer:   bilateral shoulder pain    Order Specific Question:   Preferred imaging location?    Answer:   [facility]  . DG Shoulder Left    Standing Status:   Future    Number of Occurrences:   1    Standing Expiration Date:   08/08/2020    Order Specific Question:   Reason for Exam (SYMPTOM  OR DIAGNOSIS REQUIRED)    Answer:   eval shoulder pain    Order Specific Question:   Preferred imaging location?    Answer:   [facility]    Order Specific Question:   Radiology Contrast Protocol - do NOT remove file path    Answer:   \\[email or web address].pdf  . DG Shoulder Right    Standing Status:   Future    Number of Occurrences:   1    Standing Expiration Date:   08/08/2020    Order Specific Question:   Reason for Exam (SYMPTOM  OR DIAGNOSIS REQUIRED)    Answer:   eval shoudler pain    Order Specific Question:   Preferred imaging location?    Answer:   [facility]    Order Specific Question:   Radiology Contrast Protocol - do NOT remove file path    Answer:   \\[email or web address]\DXFluoroContrastProtocols.pdf  . Ambulatory referral to Physical Therapy    Referral Priority:   Routine    Referral Type:   Physical Medicine    Referral Reason:   Specialty Services Required    Requested Specialty:   Physical  Therapy   No orders of the defined types were placed in this encounter.   Discussed warning signs or symptoms. Please see discharge instructions. Patient expresses understanding.   The above documentation has been reviewed and is accurate and complete [name], M.D.

## 2019-08-09 NOTE — Patient Instructions (Signed)
Thank you for coming in today. Plan for PT.  Get xray today.  Recheck with me in 4-6 weeks.  Let me know sooner if this is not working.   Call or go to the ER if you develop a large red swollen joint with extreme pain or oozing puss.

## 2019-08-10 NOTE — Progress Notes (Signed)
Right shoulder x-ray does show some arthritis.

## 2019-08-28 ENCOUNTER — Encounter: Payer: Self-pay | Admitting: Physical Therapy

## 2019-08-28 ENCOUNTER — Ambulatory Visit: Payer: Medicare Other | Admitting: Physical Therapy

## 2019-08-28 ENCOUNTER — Other Ambulatory Visit: Payer: Self-pay

## 2019-08-28 ENCOUNTER — Ambulatory Visit: Payer: Medicare Other | Attending: Family Medicine | Admitting: Physical Therapy

## 2019-08-28 DIAGNOSIS — M25511 Pain in right shoulder: Secondary | ICD-10-CM | POA: Diagnosis present

## 2019-08-28 DIAGNOSIS — M6281 Muscle weakness (generalized): Secondary | ICD-10-CM | POA: Insufficient documentation

## 2019-08-28 DIAGNOSIS — R252 Cramp and spasm: Secondary | ICD-10-CM | POA: Diagnosis present

## 2019-08-28 DIAGNOSIS — G8929 Other chronic pain: Secondary | ICD-10-CM | POA: Insufficient documentation

## 2019-08-28 DIAGNOSIS — M25512 Pain in left shoulder: Secondary | ICD-10-CM | POA: Diagnosis not present

## 2019-08-28 NOTE — Patient Instructions (Signed)
Access Code: [email or web address] URL: [email or web address] Date: 08/28/2019 Prepared by: Amador Cunas  Exercises Corner Pec Major Stretch - 1 x daily - 7 x weekly - 3 sets - 2 reps - 30 sec hold Seated Upper Trapezius Stretch - 1 x daily - 7 x weekly - 3 sets - 2 reps - 30 sec hold Seated Levator Scapulae Stretch - 1 x daily - 7 x weekly - 3 sets - 2 reps - 30 sec hold Seated Scapular Retraction - 1 x daily - 7 x weekly - 3 sets - 10 reps - 3 sec hold Standing Bicep Stretch at Wall - 1 x daily - 7 x weekly - 3 sets - 2 reps - 30 sec hold

## 2019-08-28 NOTE — Therapy (Signed)
[place], [name], [address] Phone: [phone]   Fax:  [phone]  Physical Therapy Evaluation  Patient Details  Name: Adrian Rios MRN: [id number] Date of Birth: 1940-12-31 Referring Provider (PT): [name]   Encounter Date: 08/28/2019   PT End of Session - 08/28/19 1057    Visit Number 1    Date for PT Re-Evaluation 10/28/19    PT Start Time 1012    PT Stop Time 1046    PT Time Calculation (min) 34 min    Activity Tolerance Patient tolerated treatment well    Behavior During Therapy [facility] for tasks assessed/performed           Past Medical History:  Diagnosis Date  . Allergic rhinitis   . Arthritis   . Benign localized prostatic hyperplasia with lower urinary tract symptoms (LUTS)   . Bladder tumor   . Borderline glaucoma of right eye   . Cancer [facility])    bladder and prostate  . Carotid bruit    per duplex 03-11-2016 RICA 1-39%  . Chronic throat clearing   . COVID-19   . DDD (degenerative disc disease), lumbar   . Depression   . ED (erectile dysfunction)   . Elevated PSA    urologist-  dr [name]--- s/p  prostate bx's  . GERD (gastroesophageal reflux disease)   . Hematuria   . History of chronic bronchitis   . 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 History of squamous cell carcinoma excision    several times  . Hyperlipidemia   . Pre-diabetes   . Premature ventricular contractions (PVCs) (VPCs)   . RAD (reactive airway disease)   . 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 Sensorineural hearing loss (SNHL) of both ears   . Sepsis ([place]) 09/2016  . UTI (urinary tract infection) 08/2016  . Wears glasses   . Wears hearing aid    bilateral    Past Surgical History:  Procedure Laterality Date  . APPENDECTOMY  2008  . CARDIOVASCULAR STRESS TEST  05/27/2010    normal nuclear study w/ no ischemia/  normal LV function and wall motion , ef 60%  . CYSTOSCOPY WITH INJECTION N/A 06/12/2016   Procedure: CYSTOSCOPY WITH INJECTION OF INDOCYANINE GREEN DYE;  Surgeon: Alexis Frock, MD;  Location: [facility];  Service: Urology;  Laterality: N/A;  . DEEP NECK LYMPH NODE BIOPSY / EXCISION  1970's   and Bronchoscopy w/ bx's ( dx Sarcoidosis)  . ILEOSTOMY    . IR NEPHROSTOMY PLACEMENT LEFT  11/18/2016  . MOHS SURGERY  2013 approx.    nasal; pre melanoma  . PARS PLANA VITRECTOMY Right 2007   repair macular pucker  . TONSILLECTOMY AND ADENOIDECTOMY  child  . TRANSURETHRAL RESECTION OF BLADDER TUMOR N/A 04/06/2016   Procedure: CYSTOSCOPY TRANSURETHRAL RESECTION OF BLADDER TUMORS (TURBT);  Surgeon: Carolan Clines, MD;  Location: [facility];  Service: Urology;  Laterality: N/A;  . TRANSURETHRAL RESECTION OF PROSTATE      There were no vitals filed for this visit.    Subjective Assessment - 08/28/19 1017    Subjective Pt reports B shoulder pain increasing in severity over the past few months L>R. Pt reports problems reaching overhead and with pain. Pt denies N/T in hands, denies increase  in headaches.    Limitations House hold activities;Lifting    Patient Stated Goals get rid of pain, get stronger    Currently in Pain? Yes    Pain Score 2     Pain Location Shoulder    Pain Orientation Left    Pain Descriptors / Indicators Aching;Dull    Pain Type Chronic pain    Pain Onset More than a month ago    Pain Frequency Constant    Aggravating Factors  reaching overhead    Pain Relieving Factors injection relieved pain, "rub on gel"              [facility] PT Assessment - 08/28/19 0001      Assessment   Medical Diagnosis B shoulder pain    Referring Provider (PT) [name]    Hand Dominance Right    Prior Therapy PT at [facility]      Precautions   Precautions None      Restrictions   Weight Bearing Restrictions No      Balance Screen    Has the patient fallen in the past 6 months No    Has the patient had a decrease in activity level because of a fear of falling?  No    Is the patient reluctant to leave their home because of a fear of falling?  No      Home Environment   Additional Comments has stairs, some yardwork      Prior Function   Level of Independence Independent    Vocation Retired    Leisure walks the Adult nurse Intact      Posture/Postural Control   Posture Comments fwd head, rounded shoulders, frequent cues to lower shoulders during exercise      ROM / Strength   AROM / PROM / Strength AROM;Strength      AROM   Overall AROM Comments limited functional shoulder IR/ER    AROM Assessment Site Shoulder;Cervical    Right/Left Shoulder Right;Left    Right Shoulder Flexion 130 Degrees    Right Shoulder ABduction 120 Degrees    Left Shoulder Flexion 130 Degrees    Left Shoulder ABduction 120 Degrees    Cervical - Right Side Bend 15    Cervical - Left Side Bend 20    Cervical - Right Rotation 30    Cervical - Left Rotation 30      Strength   Overall Strength Comments UE strength WFL B      Palpation   Palpation comment tender to palpation B UT, periscapular muscles, delt, and biceps tendon                      Objective measurements completed on examination: See above findings.       [facility] Adult PT Treatment/Exercise - 08/28/19 0001      Exercises   Exercises Shoulder;Neck      Neck Exercises: Theraband   Scapula Retraction 10 reps      Shoulder Exercises: Stretch   Other Shoulder Stretches bicep stretch 2x30 sec      Neck Exercises: Stretches   Upper Trapezius Stretch Right;Left;2 reps;30 seconds    Levator Stretch Right;Left;2 reps;30 seconds    Corner Stretch 2 reps;30 seconds                  PT Education - 08/28/19 1056    Education Details Pt educated on POC  and HEP    Person(s) Educated Patient    Methods  Explanation;Demonstration;Handout    Comprehension Verbalized understanding;Returned demonstration            PT Short Term Goals - 08/28/19 1106      PT SHORT TERM GOAL #1   Title Pt will be independent with HEP    Time 2    Period Weeks    Status New    Target Date 09/11/19             PT Long Term Goals - 08/28/19 1107      PT LONG TERM GOAL #1   Title Pt will demonstrate cervical lateral flexion and rotation [facility]    Time 6    Period Weeks    Status New    Target Date 10/09/19      PT LONG TERM GOAL #2   Title Pt will demonstrate ability to reach and lift objects >3# overhead x10 with no reports of increased shoulder pain    Time 6    Period Weeks    Status New    Target Date 10/09/19      PT LONG TERM GOAL #3   Title Pt will report reduction in B shoulder pain by 50%    Time 6    Period Weeks    Status New    Target Date 10/09/19                  Plan - 08/28/19 1103    Clinical Impression Statement Pt presents to clinic with reports of B shoulder pain L>R present for years but increasing in severity over the past few months. Pt recently received cortisone injection in L shoulder which he reports helped. Pt demonstrates limited cervical ROM particularly lat flexion and rotation, tenderness to palpation B UT/periscapular/biceps tendon, limited shoulder ROM, and reports of difficulties completing overhead activities. Pt would benefit from skilled PT to address the above impairments.    Personal Factors and Comorbidities Age;Past/Current Experience;Time since onset of injury/illness/exacerbation    Examination-Activity Limitations Lift;Reach Overhead    Examination-Participation Restrictions Community Activity;Interpersonal Relationship    Stability/Clinical Decision Making Stable/Uncomplicated    Clinical Decision Making Low    Rehab Potential Good    PT Frequency 2x / week    PT Duration 6 weeks    PT Treatment/Interventions ADLs/Self Care Home  Management;Therapeutic exercise;Therapeutic activities;Patient/family education;Manual techniques;Electrical Stimulation;Moist Heat;Neuromuscular re-education;Passive range of motion;Dry needling;Taping    PT Next Visit Plan ROM/flexibility/strengthening, manual/modalities as indicated    PT Home Exercise Plan scap retraction, UT stretch, levator stretch, bicep stretch    Consulted and Agree with Plan of Care Patient           Patient will benefit from skilled therapeutic intervention in order to improve the following deficits and impairments:  Decreased range of motion, Increased muscle spasms, Impaired UE functional use, Pain, Impaired flexibility, Postural dysfunction  Visit Diagnosis: Chronic left shoulder pain  Chronic right shoulder pain  Cramp and spasm  Muscle weakness (generalized)     Problem List Patient Active Problem List   Diagnosis Date Noted  . Chronic pain of both shoulders 08/04/2019  . COVID-19 02/28/2019  . Dizziness 06/21/2018  . Gait disorder 01/24/2018  . Left hip pain 11/11/2017  . Abdominal wall hernia 05/26/2017  . Recurrent urinary tract infection 11/13/2016  . Chronic diastolic CHF (congestive heart failure) ([place]) 11/12/2016  . Fatigue 08/19/2016  . History of bladder cancer 06/18/2016  . History  of prostate cancer 06/18/2016  . 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 Carotid bruit 02/17/2016  . Prediabetes 08/20/2015  . Overweight (BMI 25.0-29.9) 08/20/2015  . Esophageal reflux 08/20/2015  . Glaucoma 02/14/2015  . Varicose veins of lower extremities with other complications 77/93/9688  . RAD (reactive airway disease) 10/27/2011  . 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[place]), 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  . 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 ERECTILE DYSFUNCTION 04/23/2010  . [place] DISEASE, LUMBOSACRAL SPINE 02/06/2010  . 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 Depression 04/12/2008  . Allergic rhinitis 04/12/2008  . Hyperlipidemia 10/05/2006  .  Sarcoidosis ([place]) 07/05/2006   Amador Cunas, PT, DPT Donald Prose Chaela Branscum 08/28/2019, 11:08 AM  [place], [name], [address] Phone: [phone]   Fax:  [phone]  Name: FILIMON MIRANDA MRN: [id number] Date of Birth: Nov 18, 1940

## 2019-09-06 ENCOUNTER — Ambulatory Visit: Payer: Medicare Other | Attending: Family Medicine | Admitting: Physical Therapy

## 2019-09-06 ENCOUNTER — Other Ambulatory Visit: Payer: Self-pay

## 2019-09-06 ENCOUNTER — Encounter: Payer: Self-pay | Admitting: Physical Therapy

## 2019-09-06 DIAGNOSIS — M25511 Pain in right shoulder: Secondary | ICD-10-CM | POA: Insufficient documentation

## 2019-09-06 DIAGNOSIS — R252 Cramp and spasm: Secondary | ICD-10-CM | POA: Diagnosis not present

## 2019-09-06 DIAGNOSIS — G8929 Other chronic pain: Secondary | ICD-10-CM | POA: Diagnosis not present

## 2019-09-06 DIAGNOSIS — M25512 Pain in left shoulder: Secondary | ICD-10-CM | POA: Diagnosis not present

## 2019-09-06 DIAGNOSIS — M6281 Muscle weakness (generalized): Secondary | ICD-10-CM | POA: Diagnosis not present

## 2019-09-06 NOTE — Therapy (Signed)
[place], [name], [address] Phone: [phone]   Fax:  [phone]  Physical Therapy Treatment  Patient Details  Name: Adrian Rios MRN: [id number] Date of Birth: 07/25/1940 Referring Provider (PT): [name]   Encounter Date: 09/06/2019   PT End of Session - 09/06/19 1101    Visit Number 2    Date for PT Re-Evaluation 10/28/19    PT Start Time 6433    PT Stop Time 1105    PT Time Calculation (min) 50 min    Activity Tolerance Patient tolerated treatment well    Behavior During Therapy [facility] for tasks assessed/performed           Past Medical History:  Diagnosis Date  . Allergic rhinitis   . Arthritis   . Benign localized prostatic hyperplasia with lower urinary tract symptoms (LUTS)   . Bladder tumor   . Borderline glaucoma of right eye   . Cancer [facility])    bladder and prostate  . Carotid bruit    per duplex 03-11-2016 RICA 1-39%  . Chronic throat clearing   . COVID-19   . DDD (degenerative disc disease), lumbar   . Depression   . ED (erectile dysfunction)   . Elevated PSA    urologist-  dr [name]--- s/p  prostate bx's  . GERD (gastroesophageal reflux disease)   . Hematuria   . History of chronic bronchitis   . 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 History of squamous cell carcinoma excision    several times  . Hyperlipidemia   . Pre-diabetes   . Premature ventricular contractions (PVCs) (VPCs)   . RAD (reactive airway disease)   . 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 Sensorineural hearing loss (SNHL) of both ears   . Sepsis ([place]) 09/2016  . UTI (urinary tract infection) 08/2016  . Wears glasses   . Wears hearing aid    bilateral    Past Surgical History:  Procedure Laterality Date  . APPENDECTOMY  2008  . CARDIOVASCULAR STRESS TEST  05/27/2010    normal nuclear study w/ no ischemia/  normal LV function and wall motion , ef 60%  . CYSTOSCOPY WITH INJECTION N/A 06/12/2016   Procedure: CYSTOSCOPY WITH INJECTION OF INDOCYANINE GREEN DYE;  Surgeon: Alexis Frock, MD;  Location[facility];  Service: Urology;  Laterality: N/A;  . DEEP NECK LYMPH NODE BIOPSY / EXCISION  1970's   and Bronchoscopy w/ bx's ( dx Sarcoidosis)  . ILEOSTOMY    . IR NEPHROSTOMY PLACEMENT LEFT  11/18/2016  . MOHS SURGERY  2013 approx.    nasal; pre melanoma  . PARS PLANA VITRECTOMY Right 2007   repair macular pucker  . TONSILLECTOMY AND ADENOIDECTOMY  child  . TRANSURETHRAL RESECTION OF BLADDER TUMOR N/A 04/06/2016   Procedure: CYSTOSCOPY TRANSURETHRAL RESECTION OF BLADDER TUMORS (TURBT);  Surgeon: Carolan Clines, MD;  Location: [facility];  Service: Urology;  Laterality: N/A;  . TRANSURETHRAL RESECTION OF PROSTATE      There were no vitals filed for this visit.   Subjective Assessment - 09/06/19 1029    Subjective Pt reports doing well; states L shoulder is bothering him the most today    Currently in Pain? Yes    Pain Score 2  Pain Location Shoulder    Pain Orientation Left                             [facility] Adult PT Treatment/Exercise - 09/06/19 0001      Shoulder Exercises: ROM/Strengthening   UBE (Upper Arm Bike) L3 3 min fwd/3 min bkwd    Lat Pull Limitations 20# 2x15    Cybex Press Limitations 10# 2x15    Cybex Row Limitations 20# 2x15    Other ROM/Strengthening Exercises bicep curls 10# 2x10; tricep ext 15# 2x10; shoulder ext 10# 2x10    Other ROM/Strengthening Exercises standing shoulder abd/flex with 4# dumbbells 2x10      Shoulder Exercises: Stretch   Corner Stretch 2 reps;30 seconds    Other Shoulder Stretches bicep stretch 2x30 sec    Other Shoulder Stretches ball on wall with stretch x10 w/ 5 sec hold      Modalities   Modalities Moist Heat      Moist Heat Therapy   Number Minutes Moist Heat 10  Minutes    Moist Heat Location Shoulder;Cervical                    PT Short Term Goals - 08/28/19 1106      PT SHORT TERM GOAL #1   Title Pt will be independent with HEP    Time 2    Period Weeks    Status New    Target Date 09/11/19             PT Long Term Goals - 08/28/19 1107      PT LONG TERM GOAL #1   Title Pt will demonstrate cervical lateral flexion and rotation [facility]    Time 6    Period Weeks    Status New    Target Date 10/09/19      PT LONG TERM GOAL #2   Title Pt will demonstrate ability to reach and lift objects >3# overhead x10 with no reports of increased shoulder pain    Time 6    Period Weeks    Status New    Target Date 10/09/19      PT LONG TERM GOAL #3   Title Pt will report reduction in B shoulder pain by 50%    Time 6    Period Weeks    Status New    Target Date 10/09/19                 Plan - 09/06/19 1101    Clinical Impression Statement Pt tolerated progression to TE well with no reports of increased shoulder pain during rx. Pt required cues for form with IR/ER and cues to reduce shoulder elevation during ex's.    PT Treatment/Interventions ADLs/Self Care Home Management;Therapeutic exercise;Therapeutic activities;Patient/family education;Manual techniques;Electrical Stimulation;Moist Heat;Neuromuscular re-education;Passive range of motion;Dry needling;Taping    PT Next Visit Plan ROM/flexibility/strengthening, manual/modalities as indicated    Consulted and Agree with Plan of Care Patient           Patient will benefit from skilled therapeutic intervention in order to improve the following deficits and impairments:  Decreased range of motion, Increased muscle spasms, Impaired UE functional use, Pain, Impaired flexibility, Postural dysfunction  Visit Diagnosis: Chronic left shoulder pain  Chronic right shoulder pain  Cramp and spasm  Muscle weakness (generalized)     Problem List Patient Active Problem List    Diagnosis Date Noted  . Chronic  pain of both shoulders 08/04/2019  . COVID-19 02/28/2019  . Dizziness 06/21/2018  . Gait disorder 01/24/2018  . Left hip pain 11/11/2017  . Abdominal wall hernia 05/26/2017  . Recurrent urinary tract infection 11/13/2016  . Chronic diastolic CHF (congestive heart failure) ([place]) 11/12/2016  . Fatigue 08/19/2016  . History of bladder cancer 06/18/2016  . History of prostate cancer 06/18/2016  . 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 Carotid bruit 02/17/2016  . Prediabetes 08/20/2015  . Overweight (BMI 25.0-29.9) 08/20/2015  . Esophageal reflux 08/20/2015  . Glaucoma 02/14/2015  . Varicose veins of lower extremities with other complications 54/65/6812  . RAD (reactive airway disease) 10/27/2011  . 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 ERECTILE DYSFUNCTION 04/23/2010  . [place] DISEASE, LUMBOSACRAL SPINE 02/06/2010  . 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 Depression 04/12/2008  . Allergic rhinitis 04/12/2008  . Hyperlipidemia 10/05/2006  . Sarcoidosis ([place]) 07/05/2006   Amador Cunas, PT, DPT Donald Prose Ivan Maskell 09/06/2019, 11:24 AM  [place], [name], [address] Phone: [phone]   Fax:  [phone]  Name: Adrian Rios MRN: [id number] Date of Birth: May 24, 1940

## 2019-09-08 ENCOUNTER — Encounter: Payer: Medicare Other | Admitting: Physical Therapy

## 2019-09-11 ENCOUNTER — Ambulatory Visit: Payer: Medicare Other | Admitting: Physical Therapy

## 2019-09-11 ENCOUNTER — Encounter: Payer: Self-pay | Admitting: Physical Therapy

## 2019-09-11 ENCOUNTER — Other Ambulatory Visit: Payer: Self-pay

## 2019-09-11 DIAGNOSIS — G8929 Other chronic pain: Secondary | ICD-10-CM

## 2019-09-11 DIAGNOSIS — R252 Cramp and spasm: Secondary | ICD-10-CM | POA: Diagnosis not present

## 2019-09-11 DIAGNOSIS — M25511 Pain in right shoulder: Secondary | ICD-10-CM | POA: Diagnosis not present

## 2019-09-11 DIAGNOSIS — M25512 Pain in left shoulder: Secondary | ICD-10-CM | POA: Diagnosis not present

## 2019-09-11 DIAGNOSIS — M6281 Muscle weakness (generalized): Secondary | ICD-10-CM

## 2019-09-11 NOTE — Therapy (Signed)
[place], [name], [address] Phone: [phone]   Fax:  [phone]  Physical Therapy Treatment  Patient Details  Name: Adrian Rios MRN: [id number] Date of Birth: Nov 03, 1940 Referring Provider (PT): [name]   Encounter Date: 09/11/2019   PT End of Session - 09/11/19 1355    Visit Number 3    Date for PT Re-Evaluation 10/28/19    PT Start Time 1314    PT Stop Time 1356    PT Time Calculation (min) 42 min    Activity Tolerance Patient tolerated treatment well    Behavior During Therapy [facility] for tasks assessed/performed           Past Medical History:  Diagnosis Date  . Allergic rhinitis   . Arthritis   . Benign localized prostatic hyperplasia with lower urinary tract symptoms (LUTS)   . Bladder tumor   . Borderline glaucoma of right eye   . Cancer [facility])    bladder and prostate  . Carotid bruit    per duplex 03-11-2016 RICA 1-39%  . Chronic throat clearing   . COVID-19   . DDD (degenerative disc disease), lumbar   . Depression   . ED (erectile dysfunction)   . Elevated PSA    urologist-  dr [name]--- s/p  prostate bx's  . GERD (gastroesophageal reflux disease)   . Hematuria   . History of chronic bronchitis   . 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 History of squamous cell carcinoma excision    several times  . Hyperlipidemia   . Pre-diabetes   . Premature ventricular contractions (PVCs) (VPCs)   . RAD (reactive airway disease)   . 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 Sensorineural hearing loss (SNHL) of both ears   . Sepsis ([place]) 09/2016  . UTI (urinary tract infection) 08/2016  . Wears glasses   . Wears hearing aid    bilateral    Past Surgical History:  Procedure Laterality Date  . APPENDECTOMY  2008  . CARDIOVASCULAR STRESS TEST  05/27/2010    normal nuclear study w/ no ischemia/  normal LV function and wall motion , ef 60%  . CYSTOSCOPY WITH INJECTION N/A 06/12/2016   Procedure: CYSTOSCOPY WITH INJECTION OF INDOCYANINE GREEN DYE;  Surgeon: Alexis Frock, MD;  Location: [facility];  Service: Urology;  Laterality: N/A;  . DEEP NECK LYMPH NODE BIOPSY / EXCISION  1970's   and Bronchoscopy w/ bx's ( dx Sarcoidosis)  . ILEOSTOMY    . IR NEPHROSTOMY PLACEMENT LEFT  11/18/2016  . MOHS SURGERY  2013 approx.    nasal; pre melanoma  . PARS PLANA VITRECTOMY Right 2007   repair macular pucker  . TONSILLECTOMY AND ADENOIDECTOMY  child  . TRANSURETHRAL RESECTION OF BLADDER TUMOR N/A 04/06/2016   Procedure: CYSTOSCOPY TRANSURETHRAL RESECTION OF BLADDER TUMORS (TURBT);  Surgeon: Carolan Clines, MD;  Location: [facility];  Service: Urology;  Laterality: N/A;  . TRANSURETHRAL RESECTION OF PROSTATE      There were no vitals filed for this visit.   Subjective Assessment - 09/11/19 1316    Subjective Pt reports no pain today    Currently in Pain? No/denies  [place] Adult PT Treatment/Exercise - 09/11/19 0001      Shoulder Exercises: ROM/Strengthening   UBE (Upper Arm Bike) L3 3 min fwd/3 min bkwd    Lat Pull Limitations 20# 2x10    Cybex Press Limitations 10# 2x10    Cybex Row Limitations 20# 2x10    Wall Wash      Other ROM/Strengthening Exercises bicep curls 10# 2x10; tricep ext 15# 2x10; shoulder ext 10# 2x15, IR/ER 5# 1x10 B    Other ROM/Strengthening Exercises standing shoulder abd/flex with 4# dumbbells 1x10; shelf taps 1x5 B 4# flex/abd      Shoulder Exercises: Stretch   Corner Stretch 2 reps;30 seconds    Other Shoulder Stretches ball on wall with stretch x5 w/ 5 sec hold                    PT Short Term Goals - 08/28/19 1106      PT SHORT TERM GOAL #1   Title Pt will be independent with HEP    Time 2    Period Weeks    Status New    Target Date 09/11/19              PT Long Term Goals - 09/11/19 1357      PT LONG TERM GOAL #1   Title Pt will demonstrate cervical lateral flexion and rotation WFL    Time 6    Period Weeks    Status On-going      PT LONG TERM GOAL #2   Title Pt will demonstrate ability to reach and lift objects >3# overhead x10 with no reports of increased shoulder pain    Time 6    Period Weeks    Status Achieved      PT LONG TERM GOAL #3   Title Pt will report reduction in B shoulder pain by 50%    Time 6    Period Weeks    Status On-going                 Plan - 09/11/19 1356    Clinical Impression Statement Pt tolerated progression of TE well with no reports of increased shoulder pain. Pt is progressing towards goals; pt demonstrates ability to lift 4# dumbbells into cabinet with no increase in pain. Required cues throughout rx to decrease shoulder elevation.    PT Treatment/Interventions ADLs/Self Care Home Management;Therapeutic exercise;Therapeutic activities;Patient/family education;Manual techniques;Electrical Stimulation;Moist Heat;Neuromuscular re-education;Passive range of motion;Dry needling;Taping    PT Next Visit Plan ROM/flexibility/strengthening, manual/modalities as indicated    Consulted and Agree with Plan of Care Patient           Patient will benefit from skilled therapeutic intervention in order to improve the following deficits and impairments:  Decreased range of motion, Increased muscle spasms, Impaired UE functional use, Pain, Impaired flexibility, Postural dysfunction  Visit Diagnosis: Chronic left shoulder pain  Chronic right shoulder pain  Cramp and spasm  Muscle weakness (generalized)     Problem List Patient Active Problem List   Diagnosis Date Noted  . Chronic pain of both shoulders 08/04/2019  . COVID-19 02/28/2019  . Dizziness 06/21/2018  . Gait disorder 01/24/2018  . Left hip pain 11/11/2017  . Abdominal wall hernia 05/26/2017  . Recurrent urinary  tract infection 11/13/2016  . Chronic diastolic CHF (congestive heart failure) ([place]) 11/12/2016  . Fatigue 08/19/2016  . History of bladder cancer 06/18/2016  . History of prostate cancer 06/18/2016  . 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  Carotid bruit 02/17/2016  . Prediabetes 08/20/2015  . Overweight (BMI 25.0-29.9) 08/20/2015  . Esophageal reflux 08/20/2015  . Glaucoma 02/14/2015  . Varicose veins of lower extremities with other complications 92/44/6286  . RAD (reactive airway disease) 10/27/2011  . 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[place]), 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  . 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 ERECTILE DYSFUNCTION 04/23/2010  . [place] DISEASE, LUMBOSACRAL SPINE 02/06/2010  . 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 Depression 04/12/2008  . Allergic rhinitis 04/12/2008  . Hyperlipidemia 10/05/2006  . Sarcoidosis ([place]) 07/05/2006   Amador Cunas, PT, DPT Donald Prose Rayne Loiseau 09/11/2019, 1:58 PM  [place], [name], [address] Phone: [phone]   Fax:  [phone]  Name: Adrian Rios MRN: [id number] Date of Birth: 1940-05-21

## 2019-09-12 ENCOUNTER — Ambulatory Visit (INDEPENDENT_AMBULATORY_CARE_PROVIDER_SITE_OTHER): Payer: Medicare Other | Admitting: Family Medicine

## 2019-09-12 ENCOUNTER — Encounter: Payer: Self-pay | Admitting: Family Medicine

## 2019-09-12 VITALS — BP 118/68 | HR 76 | Ht 67.0 in | Wt 174.0 lb

## 2019-09-12 DIAGNOSIS — M25512 Pain in left shoulder: Secondary | ICD-10-CM | POA: Diagnosis not present

## 2019-09-12 DIAGNOSIS — M25511 Pain in right shoulder: Secondary | ICD-10-CM

## 2019-09-12 NOTE — Progress Notes (Signed)
    Adrian Rios is a 79 y.o. male who presents to [place] [facility] today for follow up of bilateral shoulder pain. Patient was last seen by Dr. Georgina Snell on 08/09/2019 for bilateral shoulder pain and stated L >R. Locates at the joint and radiates toward the back not the front. 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  No fevers or chills. Patient got an x-ray and referred to PT.  Since last visit patient reports been to PT 2-3 times and states feels like it is helping. States everything is better than it was before. States that the injections helped about 2-3 days later and today is in no pain.    Pertinent review of systems: No fevers or chills  Relevant historical information: Heart failure, sarcoidosis   Exam:  BP 118/68 (BP Location: Left Arm, Patient Position: Sitting, Cuff Size: Normal)   Pulse 76   Ht 5\' 7"  (1.702 m)   Wt 174 lb (78.9 kg)   SpO2 95%   BMI 27.25 kg/m  General: Well Developed, well nourished, and in no acute distress.   MSK: Normal shoulder motion.  Strength is intact.  Negative impingement testing bilaterally.      Assessment and Plan: 79 y.o. male with significantly improved shoulder pain following unilateral injection and physical therapy.  Plan to continue home exercise program and intermittent PT.  Recheck back with me as needed for this or future issues.    Discussed warning signs or symptoms. Please see discharge instructions. Patient expresses understanding.   The above documentation has been reviewed and is accurate and complete [name], M.D.

## 2019-09-12 NOTE — Patient Instructions (Signed)
Thank you for coming in today. Plan to continue the exercises a bit of PT.  Recheck with me as needed for the shoulder or other joint or muscle issues in the future.

## 2019-09-13 ENCOUNTER — Encounter: Payer: Self-pay | Admitting: Physical Therapy

## 2019-09-13 ENCOUNTER — Other Ambulatory Visit: Payer: Self-pay

## 2019-09-13 ENCOUNTER — Ambulatory Visit: Payer: Medicare Other | Admitting: Physical Therapy

## 2019-09-13 DIAGNOSIS — M25511 Pain in right shoulder: Secondary | ICD-10-CM

## 2019-09-13 DIAGNOSIS — R252 Cramp and spasm: Secondary | ICD-10-CM

## 2019-09-13 DIAGNOSIS — M6281 Muscle weakness (generalized): Secondary | ICD-10-CM | POA: Diagnosis not present

## 2019-09-13 DIAGNOSIS — M25512 Pain in left shoulder: Secondary | ICD-10-CM

## 2019-09-13 DIAGNOSIS — G8929 Other chronic pain: Secondary | ICD-10-CM | POA: Diagnosis not present

## 2019-09-13 NOTE — Therapy (Signed)
[place], [name], [address] Phone: [phone]   Fax:  [phone]  Physical Therapy Treatment  Patient Details  Name: Adrian Rios MRN: [id number] Date of Birth: 1940/03/31 Referring Provider (PT): [name]   Encounter Date: 09/13/2019   PT End of Session - 09/13/19 1048    Visit Number 4    Date for PT Re-Evaluation 10/28/19    PT Start Time 3244    PT Stop Time 1057    PT Time Calculation (min) 42 min    Activity Tolerance Patient tolerated treatment well    Behavior During Therapy [facility] for tasks assessed/performed           Past Medical History:  Diagnosis Date  . Allergic rhinitis   . Arthritis   . Benign localized prostatic hyperplasia with lower urinary tract symptoms (LUTS)   . Bladder tumor   . Borderline glaucoma of right eye   . Cancer [facility])    bladder and prostate  . Carotid bruit    per duplex 03-11-2016 RICA 1-39%  . Chronic throat clearing   . COVID-19   . DDD (degenerative disc disease), lumbar   . Depression   . ED (erectile dysfunction)   . Elevated PSA    urologist-  dr [name]--- s/p  prostate bx's  . GERD (gastroesophageal reflux disease)   . Hematuria   . History of chronic bronchitis   . 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 History of squamous cell carcinoma excision    several times  . Hyperlipidemia   . Pre-diabetes   . Premature ventricular contractions (PVCs) (VPCs)   . RAD (reactive airway disease)   . 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 Sensorineural hearing loss (SNHL) of both ears   . Sepsis ([place]) 09/2016  . UTI (urinary tract infection) 08/2016  . Wears glasses   . Wears hearing aid    bilateral    Past Surgical History:  Procedure Laterality Date  . APPENDECTOMY  2008  . CARDIOVASCULAR STRESS TEST  05/27/2010    normal nuclear study w/ no ischemia/  normal LV function and wall motion , ef 60%  . CYSTOSCOPY WITH INJECTION N/A 06/12/2016   Procedure: CYSTOSCOPY WITH INJECTION OF INDOCYANINE GREEN DYE;  Surgeon: Alexis Frock, MD;  [facility];  Service: Urology;  Laterality: N/A;  . DEEP NECK LYMPH NODE BIOPSY / EXCISION  1970's   and Bronchoscopy w/ bx's ( dx Sarcoidosis)  . ILEOSTOMY    . IR NEPHROSTOMY PLACEMENT LEFT  11/18/2016  . MOHS SURGERY  2013 approx.    nasal; pre melanoma  . PARS PLANA VITRECTOMY Right 2007   repair macular pucker  . TONSILLECTOMY AND ADENOIDECTOMY  child  . TRANSURETHRAL RESECTION OF BLADDER TUMOR N/A 04/06/2016   Procedure: CYSTOSCOPY TRANSURETHRAL RESECTION OF BLADDER TUMORS (TURBT);  Surgeon: Carolan Clines, MD;  Location: [facility];  Service: Urology;  Laterality: N/A;  . TRANSURETHRAL RESECTION OF PROSTATE      There were no vitals filed for this visit.   Subjective Assessment - 09/13/19 1029    Subjective Pt states he received good report from MD; feels that strength and motion is improving    Currently in Pain? No/denies  [place] Adult PT Treatment/Exercise - 09/13/19 0001      Shoulder Exercises: ROM/Strengthening   UBE (Upper Arm Bike) L3 3 min fwd/3 min bkwd    Lat Pull Limitations 20# 2x15    Cybex Press Limitations 10# 2x15    Cybex Row Limitations 20# 2x15    Other ROM/Strengthening Exercises bicep curls 10# 2x15; tricep ext 15# 2x15; shoulder ext 10# 2x15, IR/ER 5# 1x10 B    Other ROM/Strengthening Exercises shelf taps 4# 1x10 flex/abd      Shoulder Exercises: Stretch   Other Shoulder Stretches wall slide with 4# weight x10 flex/abd B                    PT Short Term Goals - 08/28/19 1106      PT SHORT TERM GOAL #1   Title Pt will be independent with HEP    Time 2    Period Weeks    Status New    Target Date 09/11/19             PT Long Term Goals -  09/11/19 1357      PT LONG TERM GOAL #1   Title Pt will demonstrate cervical lateral flexion and rotation WFL    Time 6    Period Weeks    Status On-going      PT LONG TERM GOAL #2   Title Pt will demonstrate ability to reach and lift objects >3# overhead x10 with no reports of increased shoulder pain    Time 6    Period Weeks    Status Achieved      PT LONG TERM GOAL #3   Title Pt will report reduction in B shoulder pain by 50%    Time 6    Period Weeks    Status On-going                 Plan - 09/13/19 1048    Clinical Impression Statement Pt tolerated progression of TE well with no reports of increased shoulder pain. Pt reports he will be on trip next week; plan for the following week to be his last if indicated.    PT Treatment/Interventions ADLs/Self Care Home Management;Therapeutic exercise;Therapeutic activities;Patient/family education;Manual techniques;Electrical Stimulation;Moist Heat;Neuromuscular re-education;Passive range of motion;Dry needling;Taping    PT Next Visit Plan ROM/flexibility/strengthening, manual/modalities as indicated    Consulted and Agree with Plan of Care Patient           Patient will benefit from skilled therapeutic intervention in order to improve the following deficits and impairments:  Decreased range of motion, Increased muscle spasms, Impaired UE functional use, Pain, Impaired flexibility, Postural dysfunction  Visit Diagnosis: Chronic left shoulder pain  Chronic right shoulder pain  Cramp and spasm  Muscle weakness (generalized)     Problem List Patient Active Problem List   Diagnosis Date Noted  . Chronic pain of both shoulders 08/04/2019  . COVID-19 02/28/2019  . Dizziness 06/21/2018  . Gait disorder 01/24/2018  . Left hip pain 11/11/2017  . Abdominal wall hernia 05/26/2017  . Recurrent urinary tract infection 11/13/2016  . Chronic diastolic CHF (congestive heart failure) ([place]) 11/12/2016  . Fatigue 08/19/2016   . History of bladder cancer 06/18/2016  . History of prostate cancer 06/18/2016  . 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 Carotid bruit 02/17/2016  . Prediabetes 08/20/2015  . Overweight (BMI 25.0-29.9) 08/20/2015  . Esophageal reflux 08/20/2015  . Glaucoma 02/14/2015  . Varicose veins of lower extremities with  other complications 56/81/2751  . RAD (reactive airway disease) 10/27/2011  . 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[place]), 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  . 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 ERECTILE DYSFUNCTION 04/23/2010  . [place] DISEASE, LUMBOSACRAL SPINE 02/06/2010  . 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 Depression 04/12/2008  . Allergic rhinitis 04/12/2008  . Hyperlipidemia 10/05/2006  . Sarcoidosis ([place]) 07/05/2006   Amador Cunas, PT, DPT Donald Prose Emili Mcloughlin 09/13/2019, 10:51 AM  [place], [name], [address] Phone: [phone]   Fax:  [phone]  Name: TAREN TOOPS MRN: [id number] Date of Birth: 1940/11/22

## 2019-09-27 ENCOUNTER — Encounter: Payer: Self-pay | Admitting: Physical Therapy

## 2019-09-27 ENCOUNTER — Ambulatory Visit: Payer: Medicare Other | Admitting: Physical Therapy

## 2019-09-27 ENCOUNTER — Other Ambulatory Visit: Payer: Self-pay

## 2019-09-27 DIAGNOSIS — R252 Cramp and spasm: Secondary | ICD-10-CM

## 2019-09-27 DIAGNOSIS — M6281 Muscle weakness (generalized): Secondary | ICD-10-CM

## 2019-09-27 DIAGNOSIS — M25512 Pain in left shoulder: Secondary | ICD-10-CM | POA: Diagnosis not present

## 2019-09-27 DIAGNOSIS — M25511 Pain in right shoulder: Secondary | ICD-10-CM | POA: Diagnosis not present

## 2019-09-27 DIAGNOSIS — G8929 Other chronic pain: Secondary | ICD-10-CM | POA: Diagnosis not present

## 2019-09-27 NOTE — Therapy (Signed)
Adrian Rios, [name], [address] Phone: [phone]   Fax:  [phone]  Physical Therapy Treatment  Patient Details  Name: Adrian Rios MRN: [id number] Date of Birth: 1940-12-28 Referring Provider (PT): [name]   Encounter Date: 09/27/2019   PT End of Session - 09/27/19 1145    Visit Number 5    Date for PT Re-Evaluation 10/28/19    PT Start Time 0947    PT Stop Time 1100    PT Time Calculation (min) 45 min    Activity Tolerance Patient tolerated treatment well    Behavior During Therapy [facility] for tasks assessed/performed           Past Medical History:  Diagnosis Date  . Allergic rhinitis   . Arthritis   . Benign localized prostatic hyperplasia with lower urinary tract symptoms (LUTS)   . Bladder tumor   . Borderline glaucoma of right eye   . Cancer [facility])    bladder and prostate  . Carotid bruit    per duplex 03-11-2016 RICA 1-39%  . Chronic throat clearing   . COVID-19   . DDD (degenerative disc disease), lumbar   . Depression   . ED (erectile dysfunction)   . Elevated PSA    urologist-  dr [name]--- s/p  prostate bx's  . GERD (gastroesophageal reflux disease)   . Hematuria   . History of chronic bronchitis   . 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 History of squamous cell carcinoma excision    several times  . Hyperlipidemia   . Pre-diabetes   . Premature ventricular contractions (PVCs) (VPCs)   . RAD (reactive airway disease)   . 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 Sensorineural hearing loss (SNHL) of both ears   . Sepsis ([name]) 09/2016  . UTI (urinary tract infection) 08/2016  . Wears glasses   . Wears hearing aid    bilateral    Past Surgical History:  Procedure Laterality Date  . APPENDECTOMY  2008  . CARDIOVASCULAR STRESS TEST  05/27/2010    normal nuclear study w/ no ischemia/  normal LV function and wall motion , ef 60%  . CYSTOSCOPY WITH INJECTION N/A 06/12/2016   Procedure: CYSTOSCOPY WITH INJECTION OF INDOCYANINE GREEN DYE;  Surgeon: Alexis Frock, MD;  [facility];  Service: Urology;  Laterality: N/A;  . DEEP NECK LYMPH NODE BIOPSY / EXCISION  1970's   and Bronchoscopy w/ bx's ( dx Sarcoidosis)  . ILEOSTOMY    . IR NEPHROSTOMY PLACEMENT LEFT  11/18/2016  . MOHS SURGERY  2013 approx.    nasal; pre melanoma  . PARS PLANA VITRECTOMY Right 2007   repair macular pucker  . TONSILLECTOMY AND ADENOIDECTOMY  child  . TRANSURETHRAL RESECTION OF BLADDER TUMOR N/A 04/06/2016   Procedure: CYSTOSCOPY TRANSURETHRAL RESECTION OF BLADDER TUMORS (TURBT);  Surgeon: Carolan Clines, MD;  Location: [facility];  Service: Urology;  Laterality: N/A;  . TRANSURETHRAL RESECTION OF PROSTATE      There were no vitals filed for this visit.   Subjective Assessment - 09/27/19 1018    Subjective Pt states that he is feeling pretty good; a little stiff states that he did not get to do exercises much while on vacation    Currently in Pain?  Yes    Pain Score 2     Pain Location Shoulder    Pain Orientation Left              [facility] PT Assessment - 09/27/19 0001      AROM   Overall AROM Comments shoulder ROM B WFL; L equivalent to R                         [facility] Adult PT Treatment/Exercise - 09/27/19 0001      Shoulder Exercises: ROM/Strengthening   UBE (Upper Arm Bike) L3 3 min fwd/3 min bkwd; constant work 20 W x6 min    Lat Pull Limitations 20# 2x15    Cybex Press Limitations 10# 2x15    Cybex Row Limitations 20# 2x15    Other ROM/Strengthening Exercises bicep curls 10# 2x15; tricep ext 15# 2x15; shoulder ext 10# 2x15, IR/ER 5# 1x10 B    Other ROM/Strengthening Exercises shelf taps 5# 1x10 flex/abd      Neck Exercises: Stretches   Upper Trapezius Stretch Right;Left;2 reps;30 seconds    Levator  Stretch Right;Left;2 reps;30 seconds                    PT Short Term Goals - 09/27/19 1144      PT SHORT TERM GOAL #1   Title Pt will be independent with HEP    Time 2    Period Weeks    Status Achieved    Target Date 09/11/19             PT Long Term Goals - 09/27/19 1144      PT LONG TERM GOAL #1   Title Pt will demonstrate cervical lateral flexion and rotation WFL    Time 6    Period Weeks    Status On-going      PT LONG TERM GOAL #2   Title Pt will demonstrate ability to reach and lift objects >3# overhead x10 with no reports of increased shoulder pain    Time 6    Period Weeks    Status Achieved      PT LONG TERM GOAL #3   Title Pt will report reduction in B shoulder pain by 50%    Time 6    Period Weeks    Status Achieved                 Plan - 09/27/19 1145    Clinical Impression Statement Pt tolerated progression of TE well with no reports of increased shoulder pain. Pt experiencing some stiffness d/t limited activity last week while on vacation; reported relief with movement this rx. Pt has acheived most goals with the exception of cervical ROM; given updated cervical stretches to address. Discussed potential d/c to home program next rx with pt verbalized understanding.    PT Treatment/Interventions ADLs/Self Care Home Management;Therapeutic exercise;Therapeutic activities;Patient/family education;Manual techniques;Electrical Stimulation;Moist Heat;Neuromuscular re-education;Passive range of motion;Dry needling;Taping    PT Next Visit Plan ROM/flexibility/strengthening, manual/modalities as indicated    Consulted and Agree with Plan of Care Patient           Patient will benefit from skilled therapeutic intervention in order to improve the following deficits and impairments:  Decreased range of motion, Increased muscle spasms, Impaired UE functional use, Pain, Impaired flexibility, Postural dysfunction  Visit Diagnosis: Chronic left  shoulder pain  Cramp and spasm  Muscle weakness (generalized)     Problem List Patient Active Problem  List   Diagnosis Date Noted  . Chronic pain of both shoulders 08/04/2019  . COVID-19 02/28/2019  . Dizziness 06/21/2018  . Gait disorder 01/24/2018  . Left hip pain 11/11/2017  . Abdominal wall hernia 05/26/2017  . Recurrent urinary tract infection 11/13/2016  . Chronic diastolic CHF (congestive heart failure) ([place]) 11/12/2016  . Fatigue 08/19/2016  . History of bladder cancer 06/18/2016  . History of prostate cancer 06/18/2016  . 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 Carotid bruit 02/17/2016  . Prediabetes 08/20/2015  . Overweight (BMI 25.0-29.9) 08/20/2015  . Esophageal reflux 08/20/2015  . Glaucoma 02/14/2015  . Varicose veins of lower extremities with other complications 00/76/2263  . RAD (reactive airway disease) 10/27/2011  . 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 ERECTILE DYSFUNCTION 04/23/2010  . [place] DISEASE, LUMBOSACRAL SPINE 02/06/2010  . 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 Depression 04/12/2008  . Allergic rhinitis 04/12/2008  . Hyperlipidemia 10/05/2006  . Sarcoidosis ([place]) 07/05/2006   Amador Cunas, PT, DPT Donald Prose Jaquelin Meaney 09/27/2019, 11:47 AM  [place], [name], [address] Phone: [phone]   Fax:  [phone]  Name: QUADRE BRISTOL MRN: [id number] Date of Birth: 05/01/1940

## 2019-09-29 ENCOUNTER — Encounter: Payer: Medicare Other | Admitting: Physical Therapy

## 2019-10-03 ENCOUNTER — Other Ambulatory Visit: Payer: Self-pay

## 2019-10-03 ENCOUNTER — Ambulatory Visit: Payer: Medicare Other | Attending: Family Medicine | Admitting: Physical Therapy

## 2019-10-03 DIAGNOSIS — R252 Cramp and spasm: Secondary | ICD-10-CM | POA: Diagnosis not present

## 2019-10-03 DIAGNOSIS — M6281 Muscle weakness (generalized): Secondary | ICD-10-CM | POA: Insufficient documentation

## 2019-10-03 DIAGNOSIS — G8929 Other chronic pain: Secondary | ICD-10-CM | POA: Insufficient documentation

## 2019-10-03 DIAGNOSIS — M25511 Pain in right shoulder: Secondary | ICD-10-CM | POA: Insufficient documentation

## 2019-10-03 DIAGNOSIS — M25512 Pain in left shoulder: Secondary | ICD-10-CM | POA: Insufficient documentation

## 2019-10-03 NOTE — Therapy (Signed)
[place], [name], [address] Phone: [phone]   Fax:  [phone]  Physical Therapy Treatment  Patient Details  Name: Adrian Rios MRN: [id number] Date of Birth: [birth year] Referring Provider (PT): [name]   Encounter Date: 10/03/2019   PT End of Session - 10/03/19 1246    Visit Number 6    Date for PT Re-Evaluation 10/28/19    PT Start Time 2706    PT Stop Time 1310    PT Time Calculation (min) 40 min           Past Medical History:  Diagnosis Date   Allergic rhinitis    Arthritis    Benign localized prostatic hyperplasia with lower urinary tract symptoms (LUTS)    Bladder tumor    Borderline glaucoma of right eye    Cancer ([place])    bladder and prostate   Carotid bruit    per duplex 03-11-2016 RICA 1-39%   Chronic throat clearing    COVID-19    DDD (degenerative disc disease), lumbar    Depression    ED (erectile dysfunction)    Elevated PSA    urologist-  dr [name]--- s/p  prostate bx's   GERD (gastroesophageal reflux disease)    Hematuria    History of chronic bronchitis    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    s/p  MOH's nasal --  pre-melanoma   History of squamous cell carcinoma excision    several times   Hyperlipidemia    Pre-diabetes    Premature ventricular contractions (PVCs) (VPCs)    RAD (reactive airway disease)    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[place])    dx 1970's  s/p  deep neck lymph node bx's and lung bx's   Sensorineural hearing loss (SNHL) of both ears    Sepsis ([place]) 09/2016   UTI (urinary tract infection) 08/2016   Wears glasses    Wears hearing aid    bilateral    Past Surgical History:  Procedure Laterality Date   APPENDECTOMY  2008   CARDIOVASCULAR STRESS TEST  05/27/2010   normal nuclear study w/ no ischemia/  normal LV function and wall motion , ef 60%   CYSTOSCOPY WITH INJECTION N/A  06/12/2016   Procedure: CYSTOSCOPY WITH INJECTION OF INDOCYANINE GREEN DYE;  Surgeon: Alexis Frock, MD;  Location: [facility];  Service: Urology;  Laterality: N/A;   DEEP NECK LYMPH NODE BIOPSY / EXCISION  1970's   and Bronchoscopy w/ bx's ( dx Sarcoidosis)   ILEOSTOMY     IR NEPHROSTOMY PLACEMENT LEFT  11/18/2016   MOHS SURGERY  2013 approx.    nasal; pre melanoma   PARS PLANA VITRECTOMY Right 2007   repair macular pucker   TONSILLECTOMY AND ADENOIDECTOMY  child   TRANSURETHRAL RESECTION OF BLADDER TUMOR N/A 04/06/2016   Procedure: CYSTOSCOPY TRANSURETHRAL RESECTION OF BLADDER TUMORS (TURBT);  Surgeon: Carolan Clines, MD;  Location: [facility];  Service: Urology;  Laterality: N/A;   TRANSURETHRAL RESECTION OF PROSTATE      There were no vitals filed for this visit.   Subjective Assessment - 10/03/19 1236    Subjective Left lat shdl still alittle sore- not sure if more PT will help or not    Currently in Pain? Yes    Pain Score 1     Pain Location Shoulder    Pain Orientation Left  [place] Adult PT Treatment/Exercise - 10/03/19 0001      Neck Exercises: Standing   Neck Retraction 10 reps;3 secs   head on ball   Other Standing Exercises 3# UE ex with head on ball on wall      Shoulder Exercises: Standing   External Rotation Strengthening;Both;15 reps;Theraband    Theraband Level (Shoulder External Rotation) Level 2 (Red)    Extension Strengthening;Both;15 reps;Theraband    Theraband Level (Shoulder Extension) Level 2 (Red)    Row Strengthening;Both;15 reps;Theraband    Theraband Level (Shoulder Row) Level 2 (Red)    Other Standing Exercises bicep/tricep red tband BIL      Shoulder Exercises: ROM/Strengthening   UBE (Upper Arm Bike) L3 3 min fwd/3 min bkwd; constant work 20 W x6 min    Lat Pull Limitations 20# 2x10    Cybex Press Limitations 10# 2x15    Cybex Row Limitations 25# 2x15   cues to relax traps                   PT Education - 10/03/19 1249    Education Details red tband HEP for scap stab and UE strength    Person(s) Educated Patient    Methods Explanation;Demonstration;Verbal cues;Handout    Comprehension Verbalized understanding;Returned demonstration            PT Short Term Goals - 09/27/19 1144      PT SHORT TERM GOAL #1   Title Pt will be independent with HEP    Time 2    Period Weeks    Status Achieved    Target Date 09/11/19             PT Long Term Goals - 10/03/19 1246      PT LONG TERM GOAL #1   Title Pt will demonstrate cervical lateral flexion and rotation WFL    Status Partially Met      PT LONG TERM GOAL #2   Title Pt will demonstrate ability to reach and lift objects >3# overhead x10 with no reports of increased shoulder pain    Status Achieved      PT LONG TERM GOAL #3   Title Pt will report reduction in B shoulder pain by 50%    Status Achieved      PT LONG TERM GOAL #4   Title report that he can blow leaves or do light yardwork without feeling "wiped out"    Status Partially Met                 Plan - 10/03/19 1253    Clinical Impression Statement goals mostly met but still has cerv limiations and Left shld pain that increases with ex, esp with add'l wt and ex added today. issued HEP and discussed continuing PT 1 x a week and pt agreed    PT Treatment/Interventions ADLs/Self Care Home Management;Therapeutic exercise;Therapeutic activities;Patient/family education;Manual techniques;Electrical Stimulation;Moist Heat;Neuromuscular re-education;Passive range of motion;Dry needling;Taping    PT Next Visit Plan ROM/flexibility/strengthening, manual/modalities as indicated           Patient will benefit from skilled therapeutic intervention in order to improve the following deficits and impairments:  Decreased range of motion, Increased muscle spasms, Impaired UE functional use, Pain, Impaired flexibility, Postural  dysfunction  Visit Diagnosis: Cramp and spasm  Chronic left shoulder pain  Muscle weakness (generalized)  Chronic right shoulder pain     Problem List Patient Active Problem List   Diagnosis Date Noted   Chronic pain  of both shoulders 08/04/2019   COVID-19 02/28/2019   Dizziness 06/21/2018   Gait disorder 01/24/2018   Left hip pain 11/11/2017   Abdominal wall hernia 05/26/2017   Recurrent urinary tract infection 11/13/2016   Chronic diastolic CHF (congestive heart failure) ([place]) 11/12/2016   Fatigue 08/19/2016   History of bladder cancer 06/18/2016   History of prostate cancer 06/18/2016   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[place]) 06/12/2016   Carotid bruit 02/17/2016   Prediabetes 08/20/2015   Overweight (BMI 25.0-29.9) 08/20/2015   Esophageal reflux 08/20/2015   Glaucoma 02/14/2015   Varicose veins of lower extremities with other complications 58/31/6742   RAD (reactive airway disease) 10/27/2011   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[place]), 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   PREMATURE VENTRICULAR CONTRACTIONS 04/29/2010   ERECTILE DYSFUNCTION 04/23/2010   [place] DISEASE, LUMBOSACRAL SPINE 02/06/2010   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   Depression 04/12/2008   Allergic rhinitis 04/12/2008   Hyperlipidemia 10/05/2006   Sarcoidosis ([place]) 07/05/2006    Kayti Poss,ANGIE PTA 10/03/2019, 12:56 PM  [place], [name], [address] Phone: [phone]   Fax:  [phone]  Name: Adrian Rios MRN: [id number] Date of Birth: 1940/10/02

## 2019-10-10 ENCOUNTER — Other Ambulatory Visit: Payer: Self-pay

## 2019-10-10 ENCOUNTER — Ambulatory Visit: Payer: Medicare Other | Admitting: Physical Therapy

## 2019-10-10 DIAGNOSIS — R252 Cramp and spasm: Secondary | ICD-10-CM

## 2019-10-10 DIAGNOSIS — M6281 Muscle weakness (generalized): Secondary | ICD-10-CM | POA: Diagnosis not present

## 2019-10-10 DIAGNOSIS — M25511 Pain in right shoulder: Secondary | ICD-10-CM | POA: Diagnosis not present

## 2019-10-10 DIAGNOSIS — G8929 Other chronic pain: Secondary | ICD-10-CM | POA: Diagnosis not present

## 2019-10-10 DIAGNOSIS — M25512 Pain in left shoulder: Secondary | ICD-10-CM

## 2019-10-10 NOTE — Therapy (Signed)
[place], [name], [address] Phone: [phone]   Fax:  [phone]  Physical Therapy Treatment  Patient Details  Name: Adrian Rios MRN: [id number] Date of Birth: 1940/10/08 Referring Provider (PT): [name]   Encounter Date: 10/10/2019   PT End of Session - 10/10/19 1515    Visit Number 7    Date for PT Re-Evaluation 10/28/19    PT Start Time 1624    PT Stop Time 1535    PT Time Calculation (min) 50 min           Past Medical History:  Diagnosis Date  . Allergic rhinitis   . Arthritis   . Benign localized prostatic hyperplasia with lower urinary tract symptoms (LUTS)   . Bladder tumor   . Borderline glaucoma of right eye   . Cancer [facility])    bladder and prostate  . Carotid bruit    per duplex 03-11-2016 RICA 1-39%  . Chronic throat clearing   . COVID-19   . DDD (degenerative disc disease), lumbar   . Depression   . ED (erectile dysfunction)   . Elevated PSA    urologist-  dr [name]--- s/p  prostate bx's  . GERD (gastroesophageal reflux disease)   . Hematuria   . History of chronic bronchitis   . 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 History of squamous cell carcinoma excision    several times  . Hyperlipidemia   . Pre-diabetes   . Premature ventricular contractions (PVCs) (VPCs)   . RAD (reactive airway disease)   . 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 Sensorineural hearing loss (SNHL) of both ears   . Sepsis ([place]) 09/2016  . UTI (urinary tract infection) 08/2016  . Wears glasses   . Wears hearing aid    bilateral    Past Surgical History:  Procedure Laterality Date  . APPENDECTOMY  2008  . CARDIOVASCULAR STRESS TEST  05/27/2010   normal nuclear study w/ no ischemia/  normal LV function and wall motion , ef 60%  . CYSTOSCOPY WITH INJECTION N/A  06/12/2016   Procedure: CYSTOSCOPY WITH INJECTION OF INDOCYANINE GREEN DYE;  Surgeon: Alexis Frock, MD;  Location: [facility];  Service: Urology;  Laterality: N/A;  . DEEP NECK LYMPH NODE BIOPSY / EXCISION  1970's   and Bronchoscopy w/ bx's ( dx Sarcoidosis)  . ILEOSTOMY    . IR NEPHROSTOMY PLACEMENT LEFT  11/18/2016  . MOHS SURGERY  2013 approx.    nasal; pre melanoma  . PARS PLANA VITRECTOMY Right 2007   repair macular pucker  . TONSILLECTOMY AND ADENOIDECTOMY  child  . TRANSURETHRAL RESECTION OF BLADDER TUMOR N/A 04/06/2016   Procedure: CYSTOSCOPY TRANSURETHRAL RESECTION OF BLADDER TUMORS (TURBT);  Surgeon: Carolan Clines, MD;  Location[facility];  Service: Urology;  Laterality: N/A;  . TRANSURETHRAL RESECTION OF PROSTATE      There were no vitals filed for this visit.   Subjective Assessment - 10/10/19 1448    Subjective left shld is still painful esp at night    Currently in Pain? Yes    Pain Score 2     Pain Location Shoulder    Pain Orientation Left  [place] Adult PT Treatment/Exercise - 10/10/19 0001      Neck Exercises: Standing   Neck Retraction 10 reps;3 secs   head on ball on wall   Other Standing Exercises 3# UE ex with head on ball on wall    Other Standing Exercises act cerv stretching      Shoulder Exercises: Standing   Other Standing Exercises 5# shld shruggs,backward rolls and scap squeeze 10 each      Shoulder Exercises: ROM/Strengthening   UBE (Upper Arm Bike) L3 3 min fwd/3 min bkwd; constant work 20 W x6 min    Lat Pull Limitations 20# 2x15    Cybex Press Limitations 15# 2x10    Cybex Row Limitations 25# 2x15      Modalities   Modalities Moist Heat;Electrical Stimulation      Moist Heat Therapy   Number Minutes Moist Heat 15 Minutes    Moist Heat Location Cervical      Electrical Stimulation   Electrical Stimulation Location cerv/shld Left    Electrical Stimulation Action IFC     Electrical Stimulation Parameters seated    Electrical Stimulation Goals Pain      Manual Therapy   Manual Therapy Soft tissue mobilization    Manual therapy comments painful BIL but LEft > RT with multi TP    Soft tissue mobilization BIL cerv/trp and left post shld                    PT Short Term Goals - 09/27/19 1144      PT SHORT TERM GOAL #1   Title Pt will be independent with HEP    Time 2    Period Weeks    Status Achieved    Target Date 09/11/19             PT Long Term Goals - 10/03/19 1246      PT LONG TERM GOAL #1   Title Pt will demonstrate cervical lateral flexion and rotation WFL    Status Partially Met      PT LONG TERM GOAL #2   Title Pt will demonstrate ability to reach and lift objects >3# overhead x10 with no reports of increased shoulder pain    Status Achieved      PT LONG TERM GOAL #3   Title Pt will report reduction in B shoulder pain by 50%    Status Achieved      PT LONG TERM GOAL #4   Title report that he can blow leaves or do light yardwork without feeling "wiped out"    Status Partially Met                 Plan - 10/10/19 1515    Clinical Impression Statement pt with c/o increased left shld pain in left. pt tender posterior shdl with multi TP and pain in LEft trap with palpation and stretching. added trial of estim to see if helps pain esp at night and rec MD follow up. postural cuing needed with all ex as he compensates thru traps    PT Treatment/Interventions ADLs/Self Care Home Management;Therapeutic exercise;Therapeutic activities;Patient/family education;Manual techniques;Electrical Stimulation;Moist Heat;Neuromuscular re-education;Passive range of motion;Dry needling;Taping    PT Next Visit Plan assess and progress           Patient will benefit from skilled therapeutic intervention in order to improve the following deficits and impairments:  Decreased range of motion, Increased muscle spasms, Impaired UE  functional use, Pain, Impaired flexibility, Postural dysfunction  Visit Diagnosis: Cramp and spasm  Chronic left shoulder pain  Muscle weakness (generalized)     Problem List Patient Active Problem List   Diagnosis Date Noted  . Chronic pain of both shoulders 08/04/2019  . COVID-19 02/28/2019  . Dizziness 06/21/2018  . Gait disorder 01/24/2018  . Left hip pain 11/11/2017  . Abdominal wall hernia 05/26/2017  . Recurrent urinary tract infection 11/13/2016  . Chronic diastolic CHF (congestive heart failure) ([place]) 11/12/2016  . Fatigue 08/19/2016  . History of bladder cancer 06/18/2016  . History of prostate cancer 06/18/2016  . 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 Carotid bruit 02/17/2016  . Prediabetes 08/20/2015  . Overweight (BMI 25.0-29.9) 08/20/2015  . Esophageal reflux 08/20/2015  . Glaucoma 02/14/2015  . Varicose veins of lower extremities with other complications 86/48/4720  . RAD (reactive airway disease) 10/27/2011  . 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 ERECTILE DYSFUNCTION 04/23/2010  . [place] DISEASE, LUMBOSACRAL SPINE 02/06/2010  . 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 Depression 04/12/2008  . Allergic rhinitis 04/12/2008  . Hyperlipidemia 10/05/2006  . Sarcoidosis ([place]) 07/05/2006    Korbyn Vanes,ANGIE PTA 10/10/2019, 3:17 PM  [address] [place], [name], [address] Phone: [phone]   Fax:  [phone]  Name: VIRAAJ VORNDRAN MRN: [id number] Date of Birth: [birth year]

## 2019-10-14 ENCOUNTER — Encounter: Payer: Self-pay | Admitting: Internal Medicine

## 2019-10-16 ENCOUNTER — Telehealth: Payer: Self-pay | Admitting: Internal Medicine

## 2019-10-16 DIAGNOSIS — G8929 Other chronic pain: Secondary | ICD-10-CM

## 2019-10-16 DIAGNOSIS — M25519 Pain in unspecified shoulder: Secondary | ICD-10-CM

## 2019-10-16 NOTE — Telephone Encounter (Signed)
    Patient requesting referral to pain specialist at [facility];  Dr Francoise Ceo Phone [phone] Fax [phone]

## 2019-10-16 NOTE — Telephone Encounter (Signed)
Attempted to reach patient today to confirm what appointment was needed for. His VM hasn't been set up so unable to leave a voice message. Will send him a my-chart message to confirm.

## 2019-10-16 NOTE — Telephone Encounter (Signed)
Sent my-chart message to patient for response as well.

## 2019-10-16 NOTE — Telephone Encounter (Signed)
Is this for his shoulder pain?

## 2019-10-17 NOTE — Telephone Encounter (Signed)
F/u    The patient voiced it's his shoulder pain.  The treatment from Dr. Georgina Snell did not help  The patient is asking for Dr. Larwance Rote - phone # [phone]- fax # [phone]

## 2019-10-24 DIAGNOSIS — M7552 Bursitis of left shoulder: Secondary | ICD-10-CM | POA: Diagnosis not present

## 2019-10-24 DIAGNOSIS — M25511 Pain in right shoulder: Secondary | ICD-10-CM | POA: Diagnosis not present

## 2019-10-31 ENCOUNTER — Other Ambulatory Visit: Payer: Self-pay | Admitting: Internal Medicine

## 2019-11-08 ENCOUNTER — Encounter: Payer: Self-pay | Admitting: Physical Therapy

## 2019-11-08 ENCOUNTER — Ambulatory Visit: Payer: Medicare Other | Attending: Pain Medicine | Admitting: Physical Therapy

## 2019-11-08 ENCOUNTER — Other Ambulatory Visit: Payer: Self-pay

## 2019-11-08 DIAGNOSIS — G8929 Other chronic pain: Secondary | ICD-10-CM | POA: Diagnosis not present

## 2019-11-08 DIAGNOSIS — M6281 Muscle weakness (generalized): Secondary | ICD-10-CM | POA: Diagnosis not present

## 2019-11-08 DIAGNOSIS — R252 Cramp and spasm: Secondary | ICD-10-CM | POA: Insufficient documentation

## 2019-11-08 DIAGNOSIS — M25512 Pain in left shoulder: Secondary | ICD-10-CM | POA: Insufficient documentation

## 2019-11-08 NOTE — Therapy (Signed)
[place], [name], [address] Phone: [phone]   Fax:  [phone]  Physical Therapy Evaluation  Patient Details  Name: Adrian Rios MRN: [id number] Date of Birth: [birth year] Referring Provider (PT): Buchheit   Encounter Date: 11/08/2019   PT End of Session - 11/08/19 1708    Visit Number 1    Date for PT Re-Evaluation 01/08/20    PT Start Time 4481    PT Stop Time 1607    PT Time Calculation (min) 37 min    Activity Tolerance Patient tolerated treatment well    Behavior During Therapy [facility] for tasks assessed/performed           Past Medical History:  Diagnosis Date  . Allergic rhinitis   . Arthritis   . Benign localized prostatic hyperplasia with lower urinary tract symptoms (LUTS)   . Bladder tumor   . Borderline glaucoma of right eye   . Cancer [facility])    bladder and prostate  . Carotid bruit    per duplex 03-11-2016 RICA 1-39%  . Chronic throat clearing   . COVID-19   . DDD (degenerative disc disease), lumbar   . Depression   . ED (erectile dysfunction)   . Elevated PSA    urologist-  dr [name]--- s/p  prostate bx's  . GERD (gastroesophageal reflux disease)   . Hematuria   . History of chronic bronchitis   . 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 History of squamous cell carcinoma excision    several times  . Hyperlipidemia   . Pre-diabetes   . Premature ventricular contractions (PVCs) (VPCs)   . RAD (reactive airway disease)   . 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 Sensorineural hearing loss (SNHL) of both ears   . Sepsis ([place]) 09/2016  . UTI (urinary tract infection) 08/2016  . Wears glasses   . Wears hearing aid    bilateral    Past Surgical History:  Procedure Laterality Date  . APPENDECTOMY  2008  . CARDIOVASCULAR STRESS TEST  05/27/2010    normal nuclear study w/ no ischemia/  normal LV function and wall motion , ef 60%  . CYSTOSCOPY WITH INJECTION N/A 06/12/2016   Procedure: CYSTOSCOPY WITH INJECTION OF INDOCYANINE GREEN DYE;  Surgeon: Alexis Frock, MD;  Location[facility];  Service: Urology;  Laterality: N/A;  . DEEP NECK LYMPH NODE BIOPSY / EXCISION  1970's   and Bronchoscopy w/ bx's ( dx Sarcoidosis)  . ILEOSTOMY    . IR NEPHROSTOMY PLACEMENT LEFT  11/18/2016  . MOHS SURGERY  2013 approx.    nasal; pre melanoma  . PARS PLANA VITRECTOMY Right 2007   repair macular pucker  . TONSILLECTOMY AND ADENOIDECTOMY  child  . TRANSURETHRAL RESECTION OF BLADDER TUMOR N/A 04/06/2016   Procedure: CYSTOSCOPY TRANSURETHRAL RESECTION OF BLADDER TUMORS (TURBT);  Surgeon: Carolan Clines, MD;  Location: [facility];  Service: Urology;  Laterality: N/A;  . TRANSURETHRAL RESECTION OF PROSTATE      There were no vitals filed for this visit.    Subjective Assessment - 11/08/19 1529    Subjective Pt reports that since stopping PT ~1 month ago his L shoulder was getting progressively worse with severe pain and difficulty sleeping. Pt went to different MD who diagnosed him  with subluxing biceps tendon. Pt reports that he received another injection and pain has been mild since then. Pt reports that when he was here last time that he felt some clicking/popping in shoulder and that may have been tendon slipping out of place.    Limitations House hold activities;Lifting    Currently in Pain? Yes    Pain Score 2    only hurts when moving arm   Pain Location Shoulder    Pain Orientation Left    Pain Descriptors / Indicators Burning    Pain Type Chronic pain    Pain Onset More than a month ago    Pain Frequency Intermittent    Aggravating Factors  reaching overhead    Pain Relieving Factors injection relieved pain              [facility] PT Assessment - 11/08/19 0001      Assessment   Medical Diagnosis L shoulder pain     Referring Provider (PT) Buchheit    Hand Dominance Right    Prior Therapy PT for shoulder      Precautions   Precautions None      Balance Screen   Has the patient fallen in the past 6 months No    Has the patient had a decrease in activity level because of a fear of falling?  No    Is the patient reluctant to leave their home because of a fear of falling?  No      Home Environment   Additional Comments has stairs, some yardwork      Prior Function   Level of Independence Independent    Vocation Retired    Leisure walks the Adult nurse Intact      Posture/Postural Control   Posture Comments fwd head, rounded shoulders      AROM   Overall AROM Comments shoulder ROM B WFL; L equivalent to R      Strength   Overall Strength Comments L shoulder flexion 4/5; otherwise 5/5 B UE      Palpation   Palpation comment palpable snapping of biceps tendon with shoulder external rotation and reaching overhead      Special Tests    Special Tests --    Other special tests Yergason's and Speed's + on L UE                      Objective measurements completed on examination: See above findings.       [place] Adult PT Treatment/Exercise - 11/08/19 0001      Shoulder Exercises: Standing   Internal Rotation Left;10 reps    Theraband Level (Shoulder Internal Rotation) Level 2 (Red)    Internal Rotation Limitations cues for proper form to avoid snapping of biceps tendon     Retraction Both;10 reps    Theraband Level (Shoulder Retraction) Level 2 (Red)    Retraction Limitations cues for form    Other Standing Exercises shoulder ext x10 with red TB      Shoulder Exercises: Stretch   Other Shoulder Stretches biceps stretch x30 sec hold                  PT Education - 11/08/19 1708    Education Details Pt educated on POC and HEP    Person(s) Educated Patient    Methods Explanation;Demonstration;Handout    Comprehension Verbalized  understanding;Returned demonstration  PT Short Term Goals - 11/08/19 1712      PT SHORT TERM GOAL #1   Title Pt will be independent with HEP    Time 2    Period Weeks    Status New    Target Date 11/22/19             PT Long Term Goals - 11/08/19 1712      PT LONG TERM GOAL #1   Title Pt will report ability to complete all ADLs without increase in L shoulder pain    Time 6    Period Weeks    Status New    Target Date 12/20/19      PT LONG TERM GOAL #2   Title Pt will demonstrate ability to reach and lift objects >5# overhead x10 with no reports of increased shoulder pain    Time 6    Period Weeks    Status New    Target Date 12/20/19      PT LONG TERM GOAL #3   Title Pt will report reduction in B shoulder pain by 50%    Time 6    Period Weeks    Status New    Target Date 12/20/19                  Plan - 11/08/19 1709    Clinical Impression Statement Pt presents to clinic with reports of dx of L shoulder biceps tendonitis. Pt demos palpable snapping of biceps tendon over biciptial groove with overhead lifting and passive external rotation. Pt demos weakness of scap stabilizers, forward head, and rounded shoulders. Educated pt on importance of strengthening scapular stabilizers to improve movement of tendon appropriately within bicipital groove; pt is hesitant to continue with PT. Plan to follow up with HEP next rx and discuss continuance of PT.    Personal Factors and Comorbidities Age;Past/Current Experience;Time since onset of injury/illness/exacerbation    Examination-Activity Limitations Lift;Reach Overhead    Examination-Participation Restrictions Community Activity;Interpersonal Relationship    Stability/Clinical Decision Making Stable/Uncomplicated    Clinical Decision Making Low    Rehab Potential Good    PT Frequency 1x / week    PT Duration 6 weeks    PT Treatment/Interventions ADLs/Self Care Home Management;Therapeutic  exercise;Therapeutic activities;Patient/family education;Manual techniques;Electrical Stimulation;Moist Heat;Neuromuscular re-education;Passive range of motion;Dry needling;Taping    PT Next Visit Plan decide whether to continue PT; emphasize imp of postural strengthening ex's and scap stab    PT Home Exercise Plan scap retraction, shoulder ext, bicep stretch    Consulted and Agree with Plan of Care Patient           Patient will benefit from skilled therapeutic intervention in order to improve the following deficits and impairments:  Decreased range of motion, Increased muscle spasms, Impaired UE functional use, Pain, Impaired flexibility, Postural dysfunction  Visit Diagnosis: Cramp and spasm  Chronic left shoulder pain  Muscle weakness (generalized)     Problem List Patient Active Problem List   Diagnosis Date Noted  . Chronic pain of both shoulders 08/04/2019  . COVID-19 02/28/2019  . Dizziness 06/21/2018  . Gait disorder 01/24/2018  . Left hip pain 11/11/2017  . Abdominal wall hernia 05/26/2017  . Recurrent urinary tract infection 11/13/2016  . Chronic diastolic CHF (congestive heart failure) ([place]) 11/12/2016  . Fatigue 08/19/2016  . History of bladder cancer 06/18/2016  . History of prostate cancer 06/18/2016  . 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 Carotid bruit 02/17/2016  .  Prediabetes 08/20/2015  . Overweight (BMI 25.0-29.9) 08/20/2015  . Esophageal reflux 08/20/2015  . Glaucoma 02/14/2015  . Varicose veins of lower extremities with other complications 03/83/3383  . RAD (reactive airway disease) 10/27/2011  . 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[place]), 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  . 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 ERECTILE DYSFUNCTION 04/23/2010  . [place] DISEASE, LUMBOSACRAL SPINE 02/06/2010  . 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 Depression 04/12/2008  . Allergic rhinitis 04/12/2008  . Hyperlipidemia 10/05/2006  . Sarcoidosis ([place]) 07/05/2006   Amador Cunas,  PT, DPT Donald Prose Parneet Glantz 11/08/2019, 5:13 PM  [place], [name], [address] Phone: [phone]   Fax:  [phone]  Name: ZAUL HUBERS MRN: [id number] Date of Birth: 10-04-1940

## 2019-11-10 ENCOUNTER — Encounter: Payer: Self-pay | Admitting: Internal Medicine

## 2019-11-14 ENCOUNTER — Ambulatory Visit: Payer: Medicare Other | Admitting: Physical Therapy

## 2019-11-14 ENCOUNTER — Other Ambulatory Visit: Payer: Self-pay

## 2019-11-14 ENCOUNTER — Encounter: Payer: Self-pay | Admitting: Physical Therapy

## 2019-11-14 DIAGNOSIS — G8929 Other chronic pain: Secondary | ICD-10-CM | POA: Diagnosis not present

## 2019-11-14 DIAGNOSIS — M25512 Pain in left shoulder: Secondary | ICD-10-CM | POA: Diagnosis not present

## 2019-11-14 DIAGNOSIS — M6281 Muscle weakness (generalized): Secondary | ICD-10-CM | POA: Diagnosis not present

## 2019-11-14 DIAGNOSIS — R252 Cramp and spasm: Secondary | ICD-10-CM

## 2019-11-14 NOTE — Therapy (Signed)
[place], [name], [address] Phone: [phone]   Fax:  [phone]  Physical Therapy Treatment  Patient Details  Name: JASTEN GUYETTE MRN: [id number] Date of Birth: September 13, 1940 Referring Provider (PT): Buchheit   Encounter Date: 11/14/2019   PT End of Session - 11/14/19 1356    Visit Number 2    Date for PT Re-Evaluation 01/08/20    PT Start Time 1315    PT Stop Time 1404    PT Time Calculation (min) 49 min    Activity Tolerance Patient tolerated treatment well    Behavior During Therapy [facility] for tasks assessed/performed           Past Medical History:  Diagnosis Date  . Allergic rhinitis   . Arthritis   . Benign localized prostatic hyperplasia with lower urinary tract symptoms (LUTS)   . Bladder tumor   . Borderline glaucoma of right eye   . Cancer [facility])    bladder and prostate  . Carotid bruit    per duplex 03-11-2016 RICA 1-39%  . Chronic throat clearing   . COVID-19   . DDD (degenerative disc disease), lumbar   . Depression   . ED (erectile dysfunction)   . Elevated PSA    urologist-  dr [name]--- s/p  prostate bx's  . GERD (gastroesophageal reflux disease)   . Hematuria   . History of chronic bronchitis   . 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 History of squamous cell carcinoma excision    several times  . Hyperlipidemia   . Pre-diabetes   . Premature ventricular contractions (PVCs) (VPCs)   . RAD (reactive airway disease)   . 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 Sensorineural hearing loss (SNHL) of both ears   . Sepsis ([place]) 09/2016  . UTI (urinary tract infection) 08/2016  . Wears glasses   . Wears hearing aid    bilateral    Past Surgical History:  Procedure Laterality Date  . APPENDECTOMY  2008  . CARDIOVASCULAR STRESS TEST  05/27/2010    normal nuclear study w/ no ischemia/  normal LV function and wall motion , ef 60%  . CYSTOSCOPY WITH INJECTION N/A 06/12/2016   Procedure: CYSTOSCOPY WITH INJECTION OF INDOCYANINE GREEN DYE;  Surgeon: Alexis Frock, MD;  [facility];  Service: Urology;  Laterality: N/A;  . DEEP NECK LYMPH NODE BIOPSY / EXCISION  1970's   and Bronchoscopy w/ bx's ( dx Sarcoidosis)  . ILEOSTOMY    . IR NEPHROSTOMY PLACEMENT LEFT  11/18/2016  . MOHS SURGERY  2013 approx.    nasal; pre melanoma  . PARS PLANA VITRECTOMY Right 2007   repair macular pucker  . TONSILLECTOMY AND ADENOIDECTOMY  child  . TRANSURETHRAL RESECTION OF BLADDER TUMOR N/A 04/06/2016   Procedure: CYSTOSCOPY TRANSURETHRAL RESECTION OF BLADDER TUMORS (TURBT);  Surgeon: Carolan Clines, MD;  Location: [facility];  Service: Urology;  Laterality: N/A;  . TRANSURETHRAL RESECTION OF PROSTATE      There were no vitals filed for this visit.   Subjective Assessment - 11/14/19 1317    Subjective Pt reports that pain is slightly better. Able to do most ex's/stretches without popping sensation.    Currently in Pain? Yes    Pain Score 1  Pain Location Shoulder    Pain Orientation Left                             [facility] Adult PT Treatment/Exercise - 11/14/19 0001      Shoulder Exercises: Standing   Other Standing Exercises ABC's with red ball shoulder protraction    Other Standing Exercises red TB scap stab 3 ways x5 B; shoulder rolls x5 fwd/bkwd      Shoulder Exercises: ROM/Strengthening   UBE (Upper Arm Bike) L3 3 min fwd, 3 min bkwd    Lat Pull Limitations 25# 2x10    Cybex Press Limitations 15# 2x10    Cybex Row Limitations 25# 2x10    Other ROM/Strengthening Exercises shoulder ext 10# 2x10      Shoulder Exercises: Stretch   Corner Stretch 2 reps;20 seconds    Other Shoulder Stretches bicep stretch, ER stretch x 20 sec hold 2 reps      Moist Heat Therapy   Number Minutes Moist Heat 10 Minutes     Moist Heat Location Shoulder      Electrical Stimulation   Electrical Stimulation Location cerv/shld Left    Electrical Stimulation Action IFC    Electrical Stimulation Parameters seated    Electrical Stimulation Goals Pain      Neck Exercises: Stretches   Upper Trapezius Stretch Right;Left;2 reps;30 seconds                    PT Short Term Goals - 11/08/19 1712      PT SHORT TERM GOAL #1   Title Pt will be independent with HEP    Time 2    Period Weeks    Status New    Target Date 11/22/19             PT Long Term Goals - 11/08/19 1712      PT LONG TERM GOAL #1   Title Pt will report ability to complete all ADLs without increase in L shoulder pain    Time 6    Period Weeks    Status New    Target Date 12/20/19      PT LONG TERM GOAL #2   Title Pt will demonstrate ability to reach and lift objects >5# overhead x10 with no reports of increased shoulder pain    Time 6    Period Weeks    Status New    Target Date 12/20/19      PT LONG TERM GOAL #3   Title Pt will report reduction in B shoulder pain by 50%    Time 6    Period Weeks    Status New    Target Date 12/20/19                 Plan - 11/14/19 1357    Clinical Impression Statement Pt tolerated progression to TE well. Palpated biceps tendon during machine and strengthening interventions and modified ROM during ex to avoid snapping/popping of biceps tendon. Educated pt on light stretching and how to exercise without further irritating tendon. Cervical and thoracic musculature is still very tight; flexibility ex's to address this. Cuing for form/posture during exercise.    PT Treatment/Interventions ADLs/Self Care Home Management;Therapeutic exercise;Therapeutic activities;Patient/family education;Manual techniques;Electrical Stimulation;Moist Heat;Neuromuscular re-education;Passive range of motion;Dry needling;Taping    PT Next Visit Plan postural strengthening ex's and scap stab; UE  flexibility    Consulted and Agree with Plan of Care  Patient           Patient will benefit from skilled therapeutic intervention in order to improve the following deficits and impairments:  Decreased range of motion, Increased muscle spasms, Impaired UE functional use, Pain, Impaired flexibility, Postural dysfunction  Visit Diagnosis: Cramp and spasm  Chronic left shoulder pain  Muscle weakness (generalized)     Problem List Patient Active Problem List   Diagnosis Date Noted  . Chronic pain of both shoulders 08/04/2019  . COVID-19 02/28/2019  . Dizziness 06/21/2018  . Gait disorder 01/24/2018  . Left hip pain 11/11/2017  . Abdominal wall hernia 05/26/2017  . Recurrent urinary tract infection 11/13/2016  . Chronic diastolic CHF (congestive heart failure) ([place]) 11/12/2016  . Fatigue 08/19/2016  . History of bladder cancer 06/18/2016  . History of prostate cancer 06/18/2016  . 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 Carotid bruit 02/17/2016  . Prediabetes 08/20/2015  . Overweight (BMI 25.0-29.9) 08/20/2015  . Esophageal reflux 08/20/2015  . Glaucoma 02/14/2015  . Varicose veins of lower extremities with other complications 37/62/8315  . RAD (reactive airway disease) 10/27/2011  . 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[place]), 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  . 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 ERECTILE DYSFUNCTION 04/23/2010  . [place] DISEASE, LUMBOSACRAL SPINE 02/06/2010  . 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 Depression 04/12/2008  . Allergic rhinitis 04/12/2008  . Hyperlipidemia 10/05/2006  . Sarcoidosis ([place]) 07/05/2006   Amador Cunas, PT, DPT Donald Prose Erinn Mendosa 11/14/2019, 1:59 PM  [place], [name], [address] Phone: [phone]   Fax:  [phone]  Name: NEVILLE WALSTON MRN: [id number] Date of Birth: 09-08-1940

## 2019-11-21 ENCOUNTER — Other Ambulatory Visit: Payer: Self-pay

## 2019-11-21 ENCOUNTER — Encounter: Payer: Self-pay | Admitting: Physical Therapy

## 2019-11-21 ENCOUNTER — Ambulatory Visit: Payer: Medicare Other | Admitting: Physical Therapy

## 2019-11-21 DIAGNOSIS — M6281 Muscle weakness (generalized): Secondary | ICD-10-CM

## 2019-11-21 DIAGNOSIS — M25512 Pain in left shoulder: Secondary | ICD-10-CM | POA: Diagnosis not present

## 2019-11-21 DIAGNOSIS — R252 Cramp and spasm: Secondary | ICD-10-CM

## 2019-11-21 DIAGNOSIS — G8929 Other chronic pain: Secondary | ICD-10-CM | POA: Diagnosis not present

## 2019-11-21 NOTE — Therapy (Signed)
[place] [place], [name], [address] Phone: [phone]   Fax:  [phone]  Physical Therapy Treatment  Patient Details  Name: Adrian Rios MRN: [id number] Date of Birth: 09/26/1940 Referring Provider (PT): Buchheit   Encounter Date: 11/21/2019   PT End of Session - 11/21/19 1619    Visit Number 3    Date for PT Re-Evaluation 01/08/20    PT Start Time 4580    PT Stop Time 1615    PT Time Calculation (min) 37 min    Activity Tolerance Patient tolerated treatment well    Behavior During Therapy [facility] for tasks assessed/performed           Past Medical History:  Diagnosis Date  . Allergic rhinitis   . Arthritis   . Benign localized prostatic hyperplasia with lower urinary tract symptoms (LUTS)   . Bladder tumor   . Borderline glaucoma of right eye   . Cancer [facility])    bladder and prostate  . Carotid bruit    per duplex 03-11-2016 RICA 1-39%  . Chronic throat clearing   . COVID-19   . DDD (degenerative disc disease), lumbar   . Depression   . ED (erectile dysfunction)   . Elevated PSA    urologist-  dr [name]--- s/p  prostate bx's  . GERD (gastroesophageal reflux disease)   . Hematuria   . History of chronic bronchitis   . 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 History of squamous cell carcinoma excision    several times  . Hyperlipidemia   . Pre-diabetes   . Premature ventricular contractions (PVCs) (VPCs)   . RAD (reactive airway disease)   . 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 Sensorineural hearing loss (SNHL) of both ears   . Sepsis ([place]) 09/2016  . UTI (urinary tract infection) 08/2016  . Wears glasses   . Wears hearing aid    bilateral    Past Surgical History:  Procedure Laterality Date  . APPENDECTOMY  2008  . CARDIOVASCULAR STRESS TEST  05/27/2010   normal  nuclear study w/ no ischemia/  normal LV function and wall motion , ef 60%  . CYSTOSCOPY WITH INJECTION N/A 06/12/2016   Procedure: CYSTOSCOPY WITH INJECTION OF INDOCYANINE GREEN DYE;  Surgeon: Alexis Frock, MD;  [facility];  Service: Urology;  Laterality: N/A;  . DEEP NECK LYMPH NODE BIOPSY / EXCISION  1970's   and Bronchoscopy w/ bx's ( dx Sarcoidosis)  . ILEOSTOMY    . IR NEPHROSTOMY PLACEMENT LEFT  11/18/2016  . MOHS SURGERY  2013 approx.    nasal; pre melanoma  . PARS PLANA VITRECTOMY Right 2007   repair macular pucker  . TONSILLECTOMY AND ADENOIDECTOMY  child  . TRANSURETHRAL RESECTION OF BLADDER TUMOR N/A 04/06/2016   Procedure: CYSTOSCOPY TRANSURETHRAL RESECTION OF BLADDER TUMORS (TURBT);  Surgeon: Carolan Clines, MD;  Location: [facility];  Service: Urology;  Laterality: N/A;  . TRANSURETHRAL RESECTION OF PROSTATE      There were no vitals filed for this visit.   Subjective Assessment - 11/21/19 1542    Subjective Pt reports slightly better with fewer instances of popping    Currently in Pain? No/denies  [place] Adult PT Treatment/Exercise - 11/21/19 0001      Shoulder Exercises: ROM/Strengthening   UBE (Upper Arm Bike) L3 3 min fwd, 3 min bkwd    Lat Pull Limitations 25# 2x10    Cybex Press Limitations 15# 2x10    Cybex Row Limitations 25# 2x10    Other ROM/Strengthening Exercises shoulder ext 10# 2x10    Other ROM/Strengthening Exercises tricep extension 20# 2x10; bicep curls 15# 2x10      Manual Therapy   Manual Therapy Passive ROM    Passive ROM PROM to shoulder focus on ER and pec stretching                    PT Short Term Goals - 11/21/19 1621      PT SHORT TERM GOAL #1   Title Pt will be independent with HEP    Time 2    Period Weeks    Status Achieved    Target Date 11/22/19             PT Long Term Goals - 11/08/19 1712      PT LONG TERM GOAL #1   Title Pt will  report ability to complete all ADLs without increase in L shoulder pain    Time 6    Period Weeks    Status New    Target Date 12/20/19      PT LONG TERM GOAL #2   Title Pt will demonstrate ability to reach and lift objects >5# overhead x10 with no reports of increased shoulder pain    Time 6    Period Weeks    Status New    Target Date 12/20/19      PT LONG TERM GOAL #3   Title Pt will report reduction in B shoulder pain by 50%    Time 6    Period Weeks    Status New    Target Date 12/20/19                 Plan - 11/21/19 1619    Clinical Impression Statement Pt arrived late this rx. Pt tolerated progression of TE well; no popping/clicking with any ex's today. Pt reports fewer instances of popping of biceps tendon with ADLs. Cuing for form with most ex's; cues needed to reduce shoulder elevation. Manual tx for pec stretches and ER stretches.    PT Treatment/Interventions ADLs/Self Care Home Management;Therapeutic exercise;Therapeutic activities;Patient/family education;Manual techniques;Electrical Stimulation;Moist Heat;Neuromuscular re-education;Passive range of motion;Dry needling;Taping    PT Next Visit Plan postural strengthening ex's and scap stab; UE flexibility    Consulted and Agree with Plan of Care Patient           Patient will benefit from skilled therapeutic intervention in order to improve the following deficits and impairments:  Decreased range of motion, Increased muscle spasms, Impaired UE functional use, Pain, Impaired flexibility, Postural dysfunction  Visit Diagnosis: Cramp and spasm  Chronic left shoulder pain  Muscle weakness (generalized)     Problem List Patient Active Problem List   Diagnosis Date Noted  . Chronic pain of both shoulders 08/04/2019  . COVID-19 02/28/2019  . Dizziness 06/21/2018  . Gait disorder 01/24/2018  . Left hip pain 11/11/2017  . Abdominal wall hernia 05/26/2017  . Recurrent urinary tract infection 11/13/2016   . Chronic diastolic CHF (congestive heart failure) ([place]) 11/12/2016  . Fatigue 08/19/2016  . History of bladder cancer 06/18/2016  . History of prostate cancer 06/18/2016  . S/P ileal  conduit ([place]) 06/12/2016  . Carotid bruit 02/17/2016  . Prediabetes 08/20/2015  . Overweight (BMI 25.0-29.9) 08/20/2015  . Esophageal reflux 08/20/2015  . Glaucoma 02/14/2015  . Varicose veins of lower extremities with other complications 55/16/1443  . RAD (reactive airway disease) 10/27/2011  . 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[place]), 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  . 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 ERECTILE DYSFUNCTION 04/23/2010  . [place] DISEASE, LUMBOSACRAL SPINE 02/06/2010  . 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 Depression 04/12/2008  . Allergic rhinitis 04/12/2008  . Hyperlipidemia 10/05/2006  . Sarcoidosis ([place]) 07/05/2006   Amador Cunas, PT, DPT Donald Prose Francene Mcerlean 11/21/2019, 4:22 PM  [place] [place], [name], [address] Phone: [phone]   Fax:  [phone]  Name: Adrian Rios MRN: [id number] Date of Birth: 07/27/1940

## 2019-11-23 ENCOUNTER — Ambulatory Visit: Payer: Medicare Other | Admitting: Physical Therapy

## 2019-11-23 ENCOUNTER — Encounter: Payer: Self-pay | Admitting: Physical Therapy

## 2019-11-23 ENCOUNTER — Other Ambulatory Visit: Payer: Self-pay

## 2019-11-23 DIAGNOSIS — M25512 Pain in left shoulder: Secondary | ICD-10-CM | POA: Diagnosis not present

## 2019-11-23 DIAGNOSIS — M6281 Muscle weakness (generalized): Secondary | ICD-10-CM

## 2019-11-23 DIAGNOSIS — R252 Cramp and spasm: Secondary | ICD-10-CM

## 2019-11-23 DIAGNOSIS — G8929 Other chronic pain: Secondary | ICD-10-CM

## 2019-11-23 NOTE — Therapy (Signed)
[place] [place], [name], [address] Phone: [phone]   Fax:  [phone]  Physical Therapy Treatment  Patient Details  Name: Adrian Rios MRN: [id number] Date of Birth: 1941/01/11 Referring Provider (PT): Buchheit   Encounter Date: 11/23/2019   PT End of Session - 11/23/19 1353    Visit Number 4    Date for PT Re-Evaluation 01/08/20    PT Start Time 6226    PT Stop Time 1353    PT Time Calculation (min) 40 min    Activity Tolerance Patient tolerated treatment well    Behavior During Therapy [facility] for tasks assessed/performed           Past Medical History:  Diagnosis Date  . Allergic rhinitis   . Arthritis   . Benign localized prostatic hyperplasia with lower urinary tract symptoms (LUTS)   . Bladder tumor   . Borderline glaucoma of right eye   . Cancer [facility])    bladder and prostate  . Carotid bruit    per duplex 03-11-2016 RICA 1-39%  . Chronic throat clearing   . COVID-19   . DDD (degenerative disc disease), lumbar   . Depression   . ED (erectile dysfunction)   . Elevated PSA    urologist-  dr [name]--- s/p  prostate bx's  . GERD (gastroesophageal reflux disease)   . Hematuria   . History of chronic bronchitis   . 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 History of squamous cell carcinoma excision    several times  . Hyperlipidemia   . Pre-diabetes   . Premature ventricular contractions (PVCs) (VPCs)   . RAD (reactive airway disease)   . 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 Sensorineural hearing loss (SNHL) of both ears   . Sepsis ([place]) 09/2016  . UTI (urinary tract infection) 08/2016  . Wears glasses   . Wears hearing aid    bilateral    Past Surgical History:  Procedure Laterality Date  . APPENDECTOMY  2008  . CARDIOVASCULAR STRESS TEST  05/27/2010   normal  nuclear study w/ no ischemia/  normal LV function and wall motion , ef 60%  . CYSTOSCOPY WITH INJECTION N/A 06/12/2016   Procedure: CYSTOSCOPY WITH INJECTION OF INDOCYANINE GREEN DYE;  Surgeon: Alexis Frock, MD;  Location: [facility];  Service: Urology;  Laterality: N/A;  . DEEP NECK LYMPH NODE BIOPSY / EXCISION  1970's   and Bronchoscopy w/ bx's ( dx Sarcoidosis)  . ILEOSTOMY    . IR NEPHROSTOMY PLACEMENT LEFT  11/18/2016  . MOHS SURGERY  2013 approx.    nasal; pre melanoma  . PARS PLANA VITRECTOMY Right 2007   repair macular pucker  . TONSILLECTOMY AND ADENOIDECTOMY  child  . TRANSURETHRAL RESECTION OF BLADDER TUMOR N/A 04/06/2016   Procedure: CYSTOSCOPY TRANSURETHRAL RESECTION OF BLADDER TUMORS (TURBT);  Surgeon: Carolan Clines, MD;  Location: [facility];  Service: Urology;  Laterality: N/A;  . TRANSURETHRAL RESECTION OF PROSTATE      There were no vitals filed for this visit.   Subjective Assessment - 11/23/19 1312    Subjective Pt reports he is improving; states it mostly bothers him when reaching behind him    Currently in Pain? Yes    Pain Score 1     Pain Location  Shoulder    Pain Orientation Left                             [facility] Adult PT Treatment/Exercise - 11/23/19 0001      Shoulder Exercises: Standing   Other Standing Exercises W's against wall 2x10, yellow ball raise overhead 2x10    Other Standing Exercises red TB scap stab 3 ways x5 B      Shoulder Exercises: ROM/Strengthening   UBE (Upper Arm Bike) L3 3 min fwd, 3 min bkwd    Lat Pull Limitations 25# 2x10    Cybex Press Limitations 15# 2x10    Cybex Row Limitations 25# 2x10    Other ROM/Strengthening Exercises shoulder ext 10# 2x10    Other ROM/Strengthening Exercises tricep extension 20# 2x10; bicep curls 15# 2x10      Shoulder Exercises: Stretch   Corner Stretch 2 reps;20 seconds    Other Shoulder Stretches bicep stretch x 20 sec hold 2 reps      Manual Therapy    Manual Therapy Passive ROM    Passive ROM PROM to shoulder focus on ER and pec stretching                    PT Short Term Goals - 11/21/19 1621      PT SHORT TERM GOAL #1   Title Pt will be independent with HEP    Time 2    Period Weeks    Status Achieved    Target Date 11/22/19             PT Long Term Goals - 11/08/19 1712      PT LONG TERM GOAL #1   Title Pt will report ability to complete all ADLs without increase in L shoulder pain    Time 6    Period Weeks    Status New    Target Date 12/20/19      PT LONG TERM GOAL #2   Title Pt will demonstrate ability to reach and lift objects >5# overhead x10 with no reports of increased shoulder pain    Time 6    Period Weeks    Status New    Target Date 12/20/19      PT LONG TERM GOAL #3   Title Pt will report reduction in B shoulder pain by 50%    Time 6    Period Weeks    Status New    Target Date 12/20/19                 Plan - 11/23/19 1353    Clinical Impression Statement Pt demos increased tolerance to shoulder ex's this rx. Cues for posture/form with standing shoulder extension and standing IR/ER. Modified ROM for IR/ER exercise to prevent popping of shoulder. Pt reports relief with manual stretching.    PT Treatment/Interventions ADLs/Self Care Home Management;Therapeutic exercise;Therapeutic activities;Patient/family education;Manual techniques;Electrical Stimulation;Moist Heat;Neuromuscular re-education;Passive range of motion;Dry needling;Taping    PT Next Visit Plan postural strengthening ex's and scap stab; UE flexibility    Consulted and Agree with Plan of Care Patient           Patient will benefit from skilled therapeutic intervention in order to improve the following deficits and impairments:  Decreased range of motion, Increased muscle spasms, Impaired UE functional use, Pain, Impaired flexibility, Postural dysfunction  Visit Diagnosis: Cramp and spasm  Chronic left shoulder  pain  Muscle weakness (generalized)  Problem List Patient Active Problem List   Diagnosis Date Noted  . Chronic pain of both shoulders 08/04/2019  . COVID-19 02/28/2019  . Dizziness 06/21/2018  . Gait disorder 01/24/2018  . Left hip pain 11/11/2017  . Abdominal wall hernia 05/26/2017  . Recurrent urinary tract infection 11/13/2016  . Chronic diastolic CHF (congestive heart failure) ([place]) 11/12/2016  . Fatigue 08/19/2016  . History of bladder cancer 06/18/2016  . History of prostate cancer 06/18/2016  . 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[place]) 06/12/2016  . Carotid bruit 02/17/2016  . Prediabetes 08/20/2015  . Overweight (BMI 25.0-29.9) 08/20/2015  . Esophageal reflux 08/20/2015  . Glaucoma 02/14/2015  . Varicose veins of lower extremities with other complications 91/47/8295  . RAD (reactive airway disease) 10/27/2011  . 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[place]), 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  . 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 ERECTILE DYSFUNCTION 04/23/2010  . [place] DISEASE, LUMBOSACRAL SPINE 02/06/2010  . 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 Depression 04/12/2008  . Allergic rhinitis 04/12/2008  . Hyperlipidemia 10/05/2006  . Sarcoidosis ([place]) 07/05/2006   Amador Cunas, PT, DPT Donald Prose Jaquitta Dupriest 11/23/2019, 1:55 PM  [place]. [place], [name], [address] Phone: [phone]   Fax:  [phone]  Name: Adrian Rios MRN: [id number] Date of Birth: 12-25-1940

## 2019-11-28 ENCOUNTER — Ambulatory Visit: Payer: Medicare Other | Admitting: Physical Therapy

## 2019-11-30 ENCOUNTER — Ambulatory Visit: Payer: Medicare Other | Admitting: Physical Therapy

## 2019-12-08 DIAGNOSIS — M67922 Unspecified disorder of synovium and tendon, left upper arm: Secondary | ICD-10-CM | POA: Diagnosis not present

## 2019-12-08 DIAGNOSIS — M7552 Bursitis of left shoulder: Secondary | ICD-10-CM | POA: Diagnosis not present

## 2020-01-02 DIAGNOSIS — H4051X4 Glaucoma secondary to other eye disorders, right eye, indeterminate stage: Secondary | ICD-10-CM | POA: Diagnosis not present

## 2020-01-02 DIAGNOSIS — H2512 Age-related nuclear cataract, left eye: Secondary | ICD-10-CM | POA: Diagnosis not present

## 2020-01-22 DIAGNOSIS — D692 Other nonthrombocytopenic purpura: Secondary | ICD-10-CM | POA: Diagnosis not present

## 2020-01-22 DIAGNOSIS — D2262 Melanocytic nevi of left upper limb, including shoulder: Secondary | ICD-10-CM | POA: Diagnosis not present

## 2020-01-22 DIAGNOSIS — D225 Melanocytic nevi of trunk: Secondary | ICD-10-CM | POA: Diagnosis not present

## 2020-01-22 DIAGNOSIS — L814 Other melanin hyperpigmentation: Secondary | ICD-10-CM | POA: Diagnosis not present

## 2020-01-22 DIAGNOSIS — D2261 Melanocytic nevi of right upper limb, including shoulder: Secondary | ICD-10-CM | POA: Diagnosis not present

## 2020-01-22 DIAGNOSIS — L821 Other seborrheic keratosis: Secondary | ICD-10-CM | POA: Diagnosis not present

## 2020-01-22 DIAGNOSIS — L570 Actinic keratosis: Secondary | ICD-10-CM | POA: Diagnosis not present

## 2020-01-23 DIAGNOSIS — M7552 Bursitis of left shoulder: Secondary | ICD-10-CM | POA: Diagnosis not present

## 2020-01-23 DIAGNOSIS — M67922 Unspecified disorder of synovium and tendon, left upper arm: Secondary | ICD-10-CM | POA: Diagnosis not present

## 2020-01-30 ENCOUNTER — Other Ambulatory Visit: Payer: Self-pay | Admitting: Internal Medicine

## 2020-02-01 ENCOUNTER — Other Ambulatory Visit: Payer: Self-pay

## 2020-02-01 ENCOUNTER — Telehealth: Payer: Self-pay | Admitting: Internal Medicine

## 2020-02-01 DIAGNOSIS — R062 Wheezing: Secondary | ICD-10-CM

## 2020-02-01 MED ORDER — ALBUTEROL SULFATE HFA 108 (90 BASE) MCG/ACT IN AERS: 2.0000 | INHALATION_SPRAY | Freq: Four times a day (QID) | RESPIRATORY_TRACT | 8 refills | Status: AC | PRN

## 2020-02-01 NOTE — Telephone Encounter (Signed)
Ok to fill.  Ok to be seen once a year

## 2020-02-01 NOTE — Telephone Encounter (Signed)
Sent in today 

## 2020-02-01 NOTE — Telephone Encounter (Signed)
albuterol (VENTOLIN HFA) 108 (90 Base) MCG/ACT inhaler 6 puff Last seen- 06.04.21 Next apt- N/A Kristopher Oppenheim at [place], [place] Phone:  [phone]  Fax:  [phone]     Requesting a refill

## 2020-02-06 ENCOUNTER — Other Ambulatory Visit: Payer: Self-pay | Admitting: Internal Medicine

## 2020-02-15 NOTE — Progress Notes (Signed)
Subjective:    Patient ID: Adrian Rios, male    DOB: 10-12-1940, 79 y.o.   MRN: [id number]  HPI The patient is here for an acute visit.   Swollen feet/ankles - it started 3-4 weeks.  His b/l feet and ankles have been swelling. He denies any pain.  A little improvement over night, but not much. He denies any shortness of breath, chest pain or palpitations.   No change in diet, salt intake or activity.  He has had left shoulder pain and he is following with orthopedics. His biceps tendon is messed up. He has been taking tizanidine, which helps some, but the pain can be severe at times. Pain is worse at night. He is taking ibuprofen alternating with Tylenol.  He started this about a month ago.   Taking 600 mg of ibuprofen 2-3 times a day - sometimes 4 times a day. He thinks he has been taking this for at least a month, maybe more    Last Echo 2018 - EF 60-65%, Grade 1 DD, mild asc aorta dilation, trivial MR, TR    Medications and allergies reviewed with patient and updated if appropriate.  Patient Active Problem List   Diagnosis Date Noted  . Bilateral leg edema 02/16/2020  . Chronic pain of both shoulders 08/04/2019  . COVID-19 02/28/2019  . Dizziness 06/21/2018  . Gait disorder 01/24/2018  . Left hip pain 11/11/2017  . Abdominal wall hernia 05/26/2017  . Recurrent urinary tract infection 11/13/2016  . Chronic diastolic CHF (congestive heart failure) ([place]) 11/12/2016  . Fatigue 08/19/2016  . History of bladder cancer 06/18/2016  . History of prostate cancer 06/18/2016  . 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[place]) 06/12/2016  . Carotid bruit 02/17/2016  . Prediabetes 08/20/2015  . Overweight (BMI 25.0-29.9) 08/20/2015  . Esophageal reflux 08/20/2015  . Glaucoma 02/14/2015  . Varicose veins of lower extremities with other complications 76/19/5093  . RAD (reactive airway disease) 10/27/2011  . 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[place]), 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  . 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  ERECTILE DYSFUNCTION 04/23/2010  . [place] DISEASE, LUMBOSACRAL SPINE 02/06/2010  . 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 Depression 04/12/2008  . Allergic rhinitis 04/12/2008  . Hyperlipidemia 10/05/2006  . Sarcoidosis ([place]) 07/05/2006    Current Outpatient Medications on File Prior to Visit  Medication Sig Dispense Refill  . acetaminophen (TYLENOL) 500 MG tablet Take 500 mg by mouth every 6 (six) hours as needed.    Marland Kitchen ADVAIR DISKUS 250-50 MCG/DOSE AEPB Inhale 1 puff into the lungs every 12 (twelve) hours. Annual appt due in August must see provider for future refills 60 each 5  . albuterol (VENTOLIN HFA) 108 (90 Base) MCG/ACT inhaler Inhale 2 puffs into the lungs every 6 (six) hours as needed for wheezing or shortness of breath. 1 each 8  . ibuprofen (ADVIL) 200 MG tablet Take 200 mg by mouth every 6 (six) hours as needed.    . levalbuterol (XOPENEX HFA) 45 MCG/ACT inhaler INHALE TWO PUFFS BY MOUTH INTO THE LUNGS EVERY 6 HOURS AS NEEDED FOR WHEEZING 15 g 0  . magnesium oxide (MAG-OX) 400 MG tablet Take 400 mg by mouth daily.    . Misc Natural Products (GLUCOSAMINE CHOND DOUBLE STR PO) Take 1 tablet by mouth 2 (two) times daily.    . montelukast (SINGULAIR) 10 MG tablet TAKE ONE TABLET BY MOUTH DAILY 90 tablet 0  . triamcinolone (NASACORT) 55 MCG/ACT AERO nasal inhaler Place 1 spray into the nose at bedtime.    Marland Kitchen  venlafaxine (EFFEXOR) 75 MG tablet TAKE ONE TABLET BY MOUTH DAILY 90 tablet 1   No current facility-administered medications on file prior to visit.    Past Medical History:  Diagnosis Date  . Allergic rhinitis   . Arthritis   . Benign localized prostatic hyperplasia with lower urinary tract symptoms (LUTS)   . Bladder tumor   . Borderline glaucoma of right eye   . Cancer [facility])    bladder and prostate  . Carotid bruit    per duplex 03-11-2016 RICA 1-39%  . Chronic throat clearing   . COVID-19   . DDD (degenerative disc disease), lumbar   . Depression   . ED  (erectile dysfunction)   . Elevated PSA    urologist-  dr [name]--- s/p  prostate bx's  . GERD (gastroesophageal reflux disease)   . Hematuria   . History of chronic bronchitis   . 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 History of squamous cell carcinoma excision    several times  . Hyperlipidemia   . Pre-diabetes   . Premature ventricular contractions (PVCs) (VPCs)   . RAD (reactive airway disease)   . 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 Sensorineural hearing loss (SNHL) of both ears   . Sepsis ([place]) 09/2016  . UTI (urinary tract infection) 08/2016  . Wears glasses   . Wears hearing aid    bilateral    Past Surgical History:  Procedure Laterality Date  . APPENDECTOMY  2008  . CARDIOVASCULAR STRESS TEST  05/27/2010   normal nuclear study w/ no ischemia/  normal LV function and wall motion , ef 60%  . CYSTOSCOPY WITH INJECTION N/A 06/12/2016   Procedure: CYSTOSCOPY WITH INJECTION OF INDOCYANINE GREEN DYE;  Surgeon: Alexis Frock, MD;  Location: [facility];  Service: Urology;  Laterality: N/A;  . DEEP NECK LYMPH NODE BIOPSY / EXCISION  1970's   and Bronchoscopy w/ bx's ( dx Sarcoidosis)  . ILEOSTOMY    . IR NEPHROSTOMY PLACEMENT LEFT  11/18/2016  . MOHS SURGERY  2013 approx.    nasal; pre melanoma  . PARS PLANA VITRECTOMY Right 2007   repair macular pucker  . TONSILLECTOMY AND ADENOIDECTOMY  child  . TRANSURETHRAL RESECTION OF BLADDER TUMOR N/A 04/06/2016   Procedure: CYSTOSCOPY TRANSURETHRAL RESECTION OF BLADDER TUMORS (TURBT);  Surgeon: Carolan Clines, MD;  [facility];  Service: Urology;  Laterality: N/A;  . TRANSURETHRAL RESECTION OF PROSTATE      Social History   Socioeconomic History  . Marital status: Married    Spouse name: Not on file  . Number of children: Not on file  . Years of education: Not on file  . Highest  education level: Not on file  Occupational History  . Not on file  Tobacco Use  . Smoking status: Never Smoker  . Smokeless tobacco: Never Used  Vaping Use  . Vaping Use: Never used  Substance and Sexual Activity  . Alcohol use: No  . Drug use: No  . Sexual activity: Not Currently  Other Topics Concern  . Not on file  Social History Narrative  . Not on file   Social Determinants of Health   Financial Resource Strain: Not on file  Food Insecurity: Not on file  Transportation Needs: Not on file  Physical Activity: Not on file  Stress: Not on file  Social Connections: Not on file    Family History  Problem Relation Age of Onset  . Stroke Mother        in her 67s  . Breast cancer Sister   . Heart disease Maternal Grandmother 84       MI   . Breast cancer Paternal Grandmother   . Heart disease Paternal Grandfather 27       MI  . Stroke Brother   . Healthy Father   . Diabetes Neg Hx     Review of Systems  Constitutional: Negative for fever.  Respiratory: Negative for cough (occ, chronic - allergy related), shortness of breath and wheezing.   Cardiovascular: Positive for leg swelling. Negative for chest pain and palpitations.  Genitourinary:       Ostomy bag- no change  Neurological: Positive for headaches. Negative for light-headedness.       Objective:   Vitals:   02/16/20 1023  BP: 116/78  Pulse: 73  Temp: 98.1 F (36.7 C)  SpO2: 96%   BP Readings from Last 3 Encounters:  02/16/20 116/78  09/12/19 118/68  08/09/19 120/70   Wt Readings from Last 3 Encounters:  02/16/20 181 lb (82.1 kg)  09/12/19 174 lb (78.9 kg)  08/09/19 176 lb (79.8 kg)   Body mass index is 28.35 kg/m.   Physical Exam    Constitutional: Appears well-developed and well-nourished. No distress.  Head: Normocephalic and atraumatic.  Neck: Neck supple. No tracheal deviation present. No thyromegaly present.  No cervical lymphadenopathy Cardiovascular: Normal rate, regular rhythm  and normal heart sounds.  No murmur heard. No carotid bruit . 1+ pitting edema bilateral lower extremities halfway up the lower leg, feet also swollen  Pulmonary/Chest: Effort normal and breath sounds normal. No respiratory distress. No has no wheezes. No rales.  Skin: Skin is warm and dry. Not diaphoretic.  Psychiatric: Normal mood and affect. Behavior is normal.        Assessment & Plan:    See Problem List for Assessment and Plan of chronic medical problems.    This visit occurred during the SARS-CoV-2 public health emergency.  Safety protocols were in place, including screening questions prior to the visit, additional usage of staff PPE, and extensive cleaning of exam room while observing appropriate contact time as indicated for disinfecting solutions.

## 2020-02-16 ENCOUNTER — Ambulatory Visit (INDEPENDENT_AMBULATORY_CARE_PROVIDER_SITE_OTHER): Payer: Medicare Other | Admitting: Internal Medicine

## 2020-02-16 ENCOUNTER — Encounter: Payer: Self-pay | Admitting: Internal Medicine

## 2020-02-16 ENCOUNTER — Other Ambulatory Visit: Payer: Self-pay

## 2020-02-16 DIAGNOSIS — R600 Localized edema: Secondary | ICD-10-CM | POA: Insufficient documentation

## 2020-02-16 LAB — COMPREHENSIVE METABOLIC PANEL
ALT: 19 U/L (ref 0–53)
AST: 15 U/L (ref 0–37)
Albumin: 4 g/dL (ref 3.5–5.2)
Alkaline Phosphatase: 90 U/L (ref 39–117)
BUN: 19 mg/dL (ref 6–23)
CO2: 25 mEq/L (ref 19–32)
Calcium: 9.3 mg/dL (ref 8.4–10.5)
Chloride: 108 mEq/L (ref 96–112)
Creatinine, Ser: 1.03 mg/dL (ref 0.40–1.50)
GFR: 69.04 mL/min (ref >60.00)
Glucose, Bld: 105 mg/dL — ABNORMAL HIGH (ref 70–99)
Potassium: 4.1 mEq/L (ref 3.5–5.1)
Sodium: 140 mEq/L (ref 135–145)
Total Bilirubin: 0.5 mg/dL (ref 0.2–1.2)
Total Protein: 7.1 g/dL (ref 6.0–8.3)

## 2020-02-16 LAB — BRAIN NATRIURETIC PEPTIDE: Pro B Natriuretic peptide (BNP): 34 pg/mL (ref 0.0–100.0)

## 2020-02-16 NOTE — Patient Instructions (Addendum)
  Blood work was ordered.     Flu immunization administered today.    Medications changes include :  Stop the ibuprofen.  Take Tylenol only.     Elevate your legs, eat a low sodium diet, start wearing compression socks daily with medium compression 20-30 mmHg     Please followup in 6 months

## 2020-02-16 NOTE — Assessment & Plan Note (Signed)
Acute Likely related to ibuprofen use No clinical symptoms to suggest CHF-reviewed last echocardiogram We will check CMP, BNP Stop ibuprofen-take Tylenol only Stressed low-sodium diet, elevating legs and advise getting medium compression socks Call if swelling does not improve

## 2020-02-21 DIAGNOSIS — M7552 Bursitis of left shoulder: Secondary | ICD-10-CM | POA: Diagnosis not present

## 2020-02-21 DIAGNOSIS — M67922 Unspecified disorder of synovium and tendon, left upper arm: Secondary | ICD-10-CM | POA: Diagnosis not present

## 2020-02-21 DIAGNOSIS — M19012 Primary osteoarthritis, left shoulder: Secondary | ICD-10-CM | POA: Diagnosis not present

## 2020-02-21 DIAGNOSIS — M75122 Complete rotator cuff tear or rupture of left shoulder, not specified as traumatic: Secondary | ICD-10-CM | POA: Diagnosis not present

## 2020-03-08 DIAGNOSIS — M75102 Unspecified rotator cuff tear or rupture of left shoulder, not specified as traumatic: Secondary | ICD-10-CM | POA: Diagnosis not present

## 2020-03-08 DIAGNOSIS — M19012 Primary osteoarthritis, left shoulder: Secondary | ICD-10-CM | POA: Diagnosis not present

## 2020-03-08 DIAGNOSIS — M12812 Other specific arthropathies, not elsewhere classified, left shoulder: Secondary | ICD-10-CM | POA: Diagnosis not present

## 2020-03-08 DIAGNOSIS — M25512 Pain in left shoulder: Secondary | ICD-10-CM | POA: Diagnosis not present

## 2020-03-08 DIAGNOSIS — Z96612 Presence of left artificial shoulder joint: Secondary | ICD-10-CM | POA: Diagnosis not present

## 2020-03-13 DIAGNOSIS — M25512 Pain in left shoulder: Secondary | ICD-10-CM | POA: Diagnosis not present

## 2020-03-13 DIAGNOSIS — M12812 Other specific arthropathies, not elsewhere classified, left shoulder: Secondary | ICD-10-CM | POA: Diagnosis not present

## 2020-03-13 DIAGNOSIS — M19012 Primary osteoarthritis, left shoulder: Secondary | ICD-10-CM | POA: Diagnosis not present

## 2020-03-13 DIAGNOSIS — M75102 Unspecified rotator cuff tear or rupture of left shoulder, not specified as traumatic: Secondary | ICD-10-CM | POA: Diagnosis not present

## 2020-03-15 DIAGNOSIS — M12812 Other specific arthropathies, not elsewhere classified, left shoulder: Secondary | ICD-10-CM | POA: Diagnosis not present

## 2020-03-15 DIAGNOSIS — M67922 Unspecified disorder of synovium and tendon, left upper arm: Secondary | ICD-10-CM | POA: Diagnosis not present

## 2020-03-15 DIAGNOSIS — M25512 Pain in left shoulder: Secondary | ICD-10-CM | POA: Diagnosis not present

## 2020-03-15 DIAGNOSIS — M7552 Bursitis of left shoulder: Secondary | ICD-10-CM | POA: Diagnosis not present

## 2020-03-15 DIAGNOSIS — Z01818 Encounter for other preprocedural examination: Secondary | ICD-10-CM | POA: Diagnosis not present

## 2020-03-15 DIAGNOSIS — M19012 Primary osteoarthritis, left shoulder: Secondary | ICD-10-CM | POA: Diagnosis not present

## 2020-03-15 DIAGNOSIS — M75102 Unspecified rotator cuff tear or rupture of left shoulder, not specified as traumatic: Secondary | ICD-10-CM | POA: Diagnosis not present

## 2020-03-28 DIAGNOSIS — Z23 Encounter for immunization: Secondary | ICD-10-CM | POA: Diagnosis not present

## 2020-04-02 ENCOUNTER — Other Ambulatory Visit: Payer: Self-pay | Admitting: Internal Medicine

## 2020-04-26 ENCOUNTER — Other Ambulatory Visit: Payer: Self-pay | Admitting: Internal Medicine

## 2020-04-26 DIAGNOSIS — Z79899 Other long term (current) drug therapy: Secondary | ICD-10-CM | POA: Diagnosis not present

## 2020-04-26 DIAGNOSIS — Z01818 Encounter for other preprocedural examination: Secondary | ICD-10-CM | POA: Diagnosis not present

## 2020-04-26 DIAGNOSIS — M12812 Other specific arthropathies, not elsewhere classified, left shoulder: Secondary | ICD-10-CM | POA: Diagnosis not present

## 2020-04-26 DIAGNOSIS — J45909 Unspecified asthma, uncomplicated: Secondary | ICD-10-CM | POA: Diagnosis not present

## 2020-04-26 DIAGNOSIS — F329 Major depressive disorder, single episode, unspecified: Secondary | ICD-10-CM | POA: Diagnosis not present

## 2020-04-26 DIAGNOSIS — Z9189 Other specified personal risk factors, not elsewhere classified: Secondary | ICD-10-CM | POA: Diagnosis not present

## 2020-04-26 DIAGNOSIS — D869 Sarcoidosis, unspecified: Secondary | ICD-10-CM | POA: Diagnosis not present

## 2020-04-26 DIAGNOSIS — Z8551 Personal history of malignant neoplasm of bladder: Secondary | ICD-10-CM | POA: Diagnosis not present

## 2020-05-03 DIAGNOSIS — B5801 Toxoplasma chorioretinitis: Secondary | ICD-10-CM | POA: Diagnosis not present

## 2020-05-03 DIAGNOSIS — H2511 Age-related nuclear cataract, right eye: Secondary | ICD-10-CM | POA: Diagnosis not present

## 2020-05-03 DIAGNOSIS — H4051X4 Glaucoma secondary to other eye disorders, right eye, indeterminate stage: Secondary | ICD-10-CM | POA: Diagnosis not present

## 2020-05-09 DIAGNOSIS — M19012 Primary osteoarthritis, left shoulder: Secondary | ICD-10-CM | POA: Diagnosis not present

## 2020-05-09 DIAGNOSIS — Z96612 Presence of left artificial shoulder joint: Secondary | ICD-10-CM | POA: Diagnosis not present

## 2020-05-09 DIAGNOSIS — Z8546 Personal history of malignant neoplasm of prostate: Secondary | ICD-10-CM | POA: Diagnosis not present

## 2020-05-09 DIAGNOSIS — M25512 Pain in left shoulder: Secondary | ICD-10-CM | POA: Diagnosis not present

## 2020-05-09 DIAGNOSIS — M75102 Unspecified rotator cuff tear or rupture of left shoulder, not specified as traumatic: Secondary | ICD-10-CM | POA: Diagnosis not present

## 2020-05-09 DIAGNOSIS — Z8616 Personal history of COVID-19: Secondary | ICD-10-CM | POA: Diagnosis not present

## 2020-05-09 DIAGNOSIS — D869 Sarcoidosis, unspecified: Secondary | ICD-10-CM | POA: Diagnosis not present

## 2020-05-09 DIAGNOSIS — Z79899 Other long term (current) drug therapy: Secondary | ICD-10-CM | POA: Diagnosis not present

## 2020-05-09 DIAGNOSIS — Z8551 Personal history of malignant neoplasm of bladder: Secondary | ICD-10-CM | POA: Diagnosis not present

## 2020-05-09 DIAGNOSIS — K219 Gastro-esophageal reflux disease without esophagitis: Secondary | ICD-10-CM | POA: Diagnosis not present

## 2020-05-09 DIAGNOSIS — N189 Chronic kidney disease, unspecified: Secondary | ICD-10-CM | POA: Diagnosis not present

## 2020-05-09 DIAGNOSIS — E785 Hyperlipidemia, unspecified: Secondary | ICD-10-CM | POA: Diagnosis not present

## 2020-05-09 DIAGNOSIS — J45909 Unspecified asthma, uncomplicated: Secondary | ICD-10-CM | POA: Diagnosis not present

## 2020-05-09 DIAGNOSIS — G8918 Other acute postprocedural pain: Secondary | ICD-10-CM | POA: Diagnosis not present

## 2020-05-09 DIAGNOSIS — Z471 Aftercare following joint replacement surgery: Secondary | ICD-10-CM | POA: Diagnosis not present

## 2020-05-29 DIAGNOSIS — N5232 Erectile dysfunction following radical cystectomy: Secondary | ICD-10-CM | POA: Diagnosis not present

## 2020-05-29 DIAGNOSIS — Z96612 Presence of left artificial shoulder joint: Secondary | ICD-10-CM | POA: Diagnosis not present

## 2020-05-29 DIAGNOSIS — Z79899 Other long term (current) drug therapy: Secondary | ICD-10-CM | POA: Diagnosis not present

## 2020-05-29 DIAGNOSIS — C678 Malignant neoplasm of overlapping sites of bladder: Secondary | ICD-10-CM | POA: Diagnosis not present

## 2020-05-29 DIAGNOSIS — Z803 Family history of malignant neoplasm of breast: Secondary | ICD-10-CM | POA: Diagnosis not present

## 2020-05-29 DIAGNOSIS — C61 Malignant neoplasm of prostate: Secondary | ICD-10-CM | POA: Diagnosis not present

## 2020-05-29 DIAGNOSIS — J9811 Atelectasis: Secondary | ICD-10-CM | POA: Diagnosis not present

## 2020-05-29 DIAGNOSIS — Z9049 Acquired absence of other specified parts of digestive tract: Secondary | ICD-10-CM | POA: Diagnosis not present

## 2020-05-31 DIAGNOSIS — M19012 Primary osteoarthritis, left shoulder: Secondary | ICD-10-CM | POA: Diagnosis not present

## 2020-05-31 DIAGNOSIS — Z96612 Presence of left artificial shoulder joint: Secondary | ICD-10-CM | POA: Diagnosis not present

## 2020-06-22 DIAGNOSIS — J40 Bronchitis, not specified as acute or chronic: Secondary | ICD-10-CM | POA: Diagnosis not present

## 2020-06-22 DIAGNOSIS — R059 Cough, unspecified: Secondary | ICD-10-CM | POA: Diagnosis not present

## 2020-07-01 DIAGNOSIS — J40 Bronchitis, not specified as acute or chronic: Secondary | ICD-10-CM | POA: Diagnosis not present

## 2020-07-03 ENCOUNTER — Ambulatory Visit (INDEPENDENT_AMBULATORY_CARE_PROVIDER_SITE_OTHER): Payer: Medicare Other

## 2020-07-03 ENCOUNTER — Encounter: Payer: Self-pay | Admitting: Internal Medicine

## 2020-07-03 ENCOUNTER — Ambulatory Visit (INDEPENDENT_AMBULATORY_CARE_PROVIDER_SITE_OTHER): Payer: Medicare Other | Admitting: Internal Medicine

## 2020-07-03 ENCOUNTER — Other Ambulatory Visit: Payer: Self-pay

## 2020-07-03 DIAGNOSIS — J209 Acute bronchitis, unspecified: Secondary | ICD-10-CM

## 2020-07-03 DIAGNOSIS — R062 Wheezing: Secondary | ICD-10-CM | POA: Diagnosis not present

## 2020-07-03 DIAGNOSIS — R059 Cough, unspecified: Secondary | ICD-10-CM | POA: Diagnosis not present

## 2020-07-03 MED ORDER — DOXYCYCLINE HYCLATE 100 MG PO TABS: 100.0000 mg | ORAL_TABLET | Freq: Two times a day (BID) | ORAL | 0 refills | Status: DC

## 2020-07-03 MED ORDER — BENZONATATE 100 MG PO CAPS: 100.0000 mg | ORAL_CAPSULE | Freq: Four times a day (QID) | ORAL | 0 refills | Status: DC | PRN

## 2020-07-03 NOTE — Progress Notes (Signed)
Subjective:    Patient ID: Adrian Rios, male    DOB: Feb 26, 1941, 80 y.o.   MRN: [id number]  HPI The patient is here for an acute visit.   On 06/22/20 he went to [facility] and he was diagnosed with bronchitis. He had symptoms for 4-5 days prior to going.  No CXR was done. He was prescribed a zpak , tessalon perles.  On 07/01/20 he called back because it was not getting better and he was prescribed Augmentin.  He has continued to take the [employer]  He is also taking Tylenol and using his inhalers.  He has been experiencing some wheezing and his wife told him that his breathing was not good last night although he did not realize that and slept okay.  He continues to have some of the same symptoms and he is concerned that he is not getting better.  He has also started to have diarrhea in the past couple of days since starting the Augmentin.  He states fatigue, some nasal congestion, productive cough of dark green-yellow sputum, mild shortness of breath, wheezing mostly at night or in the morning, headaches and diarrhea since starting the Augmentin.  Medications and allergies reviewed with patient and updated if appropriate.  Patient Active Problem List   Diagnosis Date Noted  . Bilateral leg edema 02/16/2020  . Chronic pain of both shoulders 08/04/2019  . COVID-19 02/28/2019  . Dizziness 06/21/2018  . Gait disorder 01/24/2018  . Left hip pain 11/11/2017  . Abdominal wall hernia 05/26/2017  . Recurrent urinary tract infection 11/13/2016  . Chronic diastolic CHF (congestive heart failure) ([place]) 11/12/2016  . Fatigue 08/19/2016  . History of bladder cancer 06/18/2016  . History of prostate cancer 06/18/2016  . 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 Carotid bruit 02/17/2016  . Prediabetes 08/20/2015  . Overweight (BMI 25.0-29.9) 08/20/2015  . Glaucoma 02/14/2015  . Varicose veins of lower extremities with other complications 47/42/5956  . RAD (reactive airway  disease) 10/27/2011  . 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 ERECTILE DYSFUNCTION 04/23/2010  . [place] DISEASE, LUMBOSACRAL SPINE 02/06/2010  . 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 Depression 04/12/2008  . Allergic rhinitis 04/12/2008  . Hyperlipidemia 10/05/2006  . Sarcoidosis ([place]) 07/05/2006    Current Outpatient Medications on File Prior to Visit  Medication Sig Dispense Refill  . acetaminophen (TYLENOL) 500 MG tablet Take 500 mg by mouth every 6 (six) hours as needed.    Marland Kitchen ADVAIR DISKUS 250-50 MCG/DOSE AEPB INHALE 1 PUFF INTO THE LUNGS EVERY 12 HOURS 60 each 5  . albuterol (VENTOLIN HFA) 108 (90 Base) MCG/ACT inhaler Inhale 2 puffs into the lungs every 6 (six) hours as needed for wheezing or shortness of breath. 1 each 8  . ibuprofen (ADVIL) 200 MG tablet Take 200 mg by mouth every 6 (six) hours as needed.    . magnesium oxide (MAG-OX) 400 MG tablet Take 400 mg by mouth daily.    . Misc Natural Products (GLUCOSAMINE CHOND DOUBLE STR PO) Take 1 tablet by mouth 2 (two) times daily.    . montelukast (SINGULAIR) 10 MG tablet TAKE ONE TABLET BY MOUTH DAILY 90 tablet 0  . triamcinolone (NASACORT) 55 MCG/ACT AERO nasal inhaler Place 1 spray into the nose at bedtime.    Marland Kitchen venlafaxine (EFFEXOR) 75 MG tablet TAKE ONE TABLET BY MOUTH DAILY 90 tablet 1   No current facility-administered medications on file prior to visit.  Past Medical History:  Diagnosis Date  . Allergic rhinitis   . Arthritis   . Benign localized prostatic hyperplasia with lower urinary tract symptoms (LUTS)   . Bladder tumor   . Borderline glaucoma of right eye   . Cancer [facility])    bladder and prostate  . Carotid bruit    per duplex 03-11-2016 RICA 1-39%  . Chronic throat clearing   . COVID-19   . DDD (degenerative disc disease), lumbar   . Depression   . ED (erectile dysfunction)   . Elevated PSA    urologist-  dr [name]--- s/p  prostate bx's   . GERD (gastroesophageal reflux disease)   . Hematuria   . History of chronic bronchitis   . 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 History of squamous cell carcinoma excision    several times  . Hyperlipidemia   . Pre-diabetes   . Premature ventricular contractions (PVCs) (VPCs)   . RAD (reactive airway disease)   . 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 Sensorineural hearing loss (SNHL) of both ears   . Sepsis ([place]) 09/2016  . UTI (urinary tract infection) 08/2016  . Wears glasses   . Wears hearing aid    bilateral    Past Surgical History:  Procedure Laterality Date  . APPENDECTOMY  2008  . CARDIOVASCULAR STRESS TEST  05/27/2010   normal nuclear study w/ no ischemia/  normal LV function and wall motion , ef 60%  . CYSTOSCOPY WITH INJECTION N/A 06/12/2016   Procedure: CYSTOSCOPY WITH INJECTION OF INDOCYANINE GREEN DYE;  Surgeon: Alexis Frock, MD;  Location: [facility];  Service: Urology;  Laterality: N/A;  . DEEP NECK LYMPH NODE BIOPSY / EXCISION  1970's   and Bronchoscopy w/ bx's ( dx Sarcoidosis)  . ILEOSTOMY    . IR NEPHROSTOMY PLACEMENT LEFT  11/18/2016  . MOHS SURGERY  2013 approx.    nasal; pre melanoma  . PARS PLANA VITRECTOMY Right 2007   repair macular pucker  . TONSILLECTOMY AND ADENOIDECTOMY  child  . TRANSURETHRAL RESECTION OF BLADDER TUMOR N/A 04/06/2016   Procedure: CYSTOSCOPY TRANSURETHRAL RESECTION OF BLADDER TUMORS (TURBT);  Surgeon: Carolan Clines, MD;  Location: [facility];  Service: Urology;  Laterality: N/A;  . TRANSURETHRAL RESECTION OF PROSTATE      Social History   Socioeconomic History  . Marital status: Married    Spouse name: Not on file  . Number of children: Not on file  . Years of education: Not on file  . Highest education level: Not on file  Occupational History  . Not on file  Tobacco Use  . Smoking  status: Never Smoker  . Smokeless tobacco: Never Used  Vaping Use  . Vaping Use: Never used  Substance and Sexual Activity  . Alcohol use: No  . Drug use: No  . Sexual activity: Not Currently  Other Topics Concern  . Not on file  Social History Narrative  . Not on file   Social Determinants of Health   Financial Resource Strain: Not on file  Food Insecurity: Not on file  Transportation Needs: Not on file  Physical Activity: Not on file  Stress: Not on file  Social Connections: Not on file    Family History  Problem Relation Age of Onset  . Stroke Mother  in her 44s  . Breast cancer Sister   . Heart disease Maternal Grandmother 84       MI   . Breast cancer Paternal Grandmother   . Heart disease Paternal Grandfather 8       MI  . Stroke Brother   . Healthy Father   . Diabetes Neg Hx     Review of Systems  Constitutional: Positive for fatigue and fever (initally low grade, none recently). Negative for appetite change and chills.  HENT: Positive for congestion (some). Negative for ear pain, sinus pressure, sinus pain and sore throat.   Respiratory: Positive for cough (productive  - dark greenish-yellow), shortness of breath (mild) and wheezing (at night and in the morning). Negative for chest tightness.   Gastrointestinal: Positive for diarrhea (started with augmentin). Negative for nausea and vomiting.  Neurological: Positive for headaches. Negative for light-headedness.       Objective:   Vitals:   07/03/20 1355  BP: 114/70  Pulse: 84  Temp: 98.4 F (36.9 C)  SpO2: 95%   BP Readings from Last 3 Encounters:  07/03/20 114/70  02/16/20 116/78  09/12/19 118/68   Wt Readings from Last 3 Encounters:  07/03/20 173 lb (78.5 kg)  02/16/20 181 lb (82.1 kg)  09/12/19 174 lb (78.9 kg)   Body mass index is 27.1 kg/m.   Physical Exam    GENERAL APPEARANCE: Appears stated age, well appearing, NAD EYES: conjunctiva clear, no icterus HENT: bilateral  tympanic membranes and ear canals normal, oropharynx with no erythema or exudates, trachea midline, no cervical or supraclavicular lymphadenopathy LUNGS: Unlabored breathing, good air entry bilaterally, clear to auscultation without wheeze or crackles CARDIOVASCULAR: Normal S1,S2 , no edema SKIN: Warm, dry      Assessment & Plan:    See Problem List for Assessment and Plan of chronic medical problems.    This visit occurred during the SARS-CoV-2 public health emergency.  Safety protocols were in place, including screening questions prior to the visit, additional usage of staff PPE, and extensive cleaning of exam room while observing appropriate contact time as indicated for disinfecting solutions.

## 2020-07-03 NOTE — Assessment & Plan Note (Addendum)
Subacute cxr today since he has not had 1 Stop the augmentin since it is causing diarrhea Start doxycycline 100 mg twice daily x10 days Continue inhalers, Tylenol, Tessalon Perles Can take Mucinex and over-the-counter cold medications for symptom relief Stressed increased rest and fluids Advised that most likely he will start to feel better after few days, but I think his first antibiotic was not effective and he just got going on the second antibiotic He will call if his symptoms do not improve or worsen

## 2020-07-03 NOTE — Patient Instructions (Addendum)
Have a chest xray today.   Stop the Augmentin and start doxycyline.   Start probiotics.    Continue the cough pills.  You can take tylenol, mucinex.     Continue rest, fluids.     Please call if there is no improvement in your symptoms.

## 2020-07-22 ENCOUNTER — Other Ambulatory Visit: Payer: Self-pay | Admitting: Internal Medicine

## 2020-07-25 ENCOUNTER — Telehealth: Payer: Self-pay | Admitting: Internal Medicine

## 2020-07-25 MED ORDER — HYDROCODONE BIT-HOMATROP MBR 5-1.5 MG/5ML PO SOLN: 5.0000 mL | Freq: Four times a day (QID) | ORAL | 0 refills | Status: DC | PRN

## 2020-07-25 NOTE — Telephone Encounter (Signed)
Spoke with patient today. 

## 2020-07-25 NOTE — Telephone Encounter (Signed)
I can send in a cough syrup, but this may make him slightly drowsy-okay to take during the day as long as he is not driving

## 2020-07-25 NOTE — Telephone Encounter (Signed)
Patient called and said that he is still having a cough. He said that benzonatate (TESSALON) 100 MG capsule did not help and was wondering if something else could be called in. It can be sent to Krist[facility]en[facility] at [place], [place]. Please advise

## 2020-08-01 NOTE — Progress Notes (Signed)
Subjective:    Patient ID: Adrian Rios, male    DOB: 09-27-1940, 80 y.o.   MRN: [id number]  HPI The patient is here for follow up of their chronic medical problems, including prediabetes, hld, depression, RAD  He had bronchitis last month.  He is still coughing up a little phlegm.  It is getting better.     Has been having pain in right lower back ?kidney area.  He has a urostomy bag.  He is concerned about a kidney stone.  It started a week or week and a half ago.  It is intermittent.  It is worse with bending over and movement.  Tylenol does help - it will take the pain away, so does sitting still.     He walks his dog regularly.    Medications and allergies reviewed with patient and updated if appropriate.  Patient Active Problem List   Diagnosis Date Noted  . Acute bronchitis 07/03/2020  . Bilateral leg edema 02/16/2020  . Chronic pain of both shoulders 08/04/2019  . COVID-19 02/28/2019  . Dizziness 06/21/2018  . Gait disorder 01/24/2018  . Left hip pain 11/11/2017  . Abdominal wall hernia 05/26/2017  . Recurrent urinary tract infection 11/13/2016  . Chronic diastolic CHF (congestive heart failure) ([place]) 11/12/2016  . Fatigue 08/19/2016  . History of bladder cancer 06/18/2016  . History of prostate cancer 06/18/2016  . 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[place]) 06/12/2016  . Carotid bruit 02/17/2016  . Prediabetes 08/20/2015  . Overweight (BMI 25.0-29.9) 08/20/2015  . Glaucoma 02/14/2015  . Varicose veins of lower extremities with other complications 19/62/2297  . RAD (reactive airway disease) 10/27/2011  . 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 ERECTILE DYSFUNCTION 04/23/2010  . [place] DISEASE, LUMBOSACRAL SPINE 02/06/2010  . 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 Depression 04/12/2008  . Allergic rhinitis 04/12/2008  . Hyperlipidemia 10/05/2006  . Sarcoidosis ([place]) 07/05/2006    Current Outpatient Medications on File  Prior to Visit  Medication Sig Dispense Refill  . acetaminophen (TYLENOL) 500 MG tablet Take 500 mg by mouth every 6 (six) hours as needed.    Marland Kitchen ADVAIR DISKUS 250-50 MCG/DOSE AEPB INHALE 1 PUFF INTO THE LUNGS EVERY 12 HOURS 60 each 5  . albuterol (VENTOLIN HFA) 108 (90 Base) MCG/ACT inhaler Inhale 2 puffs into the lungs every 6 (six) hours as needed for wheezing or shortness of breath. 1 each 8  . HYDROcodone bit-homatropine (HYDROMET) 5-1.5 MG/5ML syrup Take 5 mLs by mouth every 6 (six) hours as needed for cough. 120 mL 0  . ibuprofen (ADVIL) 200 MG tablet Take 200 mg by mouth every 6 (six) hours as needed.    . latanoprost (XALATAN) 0.005 % ophthalmic solution SMARTSIG:In Eye(s)    . magnesium oxide (MAG-OX) 400 MG tablet Take 400 mg by mouth daily.    . Misc Natural Products (GLUCOSAMINE CHOND DOUBLE STR PO) Take 1 tablet by mouth 2 (two) times daily.    . montelukast (SINGULAIR) 10 MG tablet TAKE ONE TABLET BY MOUTH DAILY 90 tablet 0  . triamcinolone (NASACORT) 55 MCG/ACT AERO nasal inhaler Place 1 spray into the nose at bedtime.    Marland Kitchen venlafaxine (EFFEXOR) 75 MG tablet TAKE ONE TABLET BY MOUTH DAILY 90 tablet 1   No current facility-administered medications on file prior to visit.    Past Medical History:  Diagnosis Date  . Allergic rhinitis   . Arthritis   . Benign localized prostatic hyperplasia with  lower urinary tract symptoms (LUTS)   . Bladder tumor   . Borderline glaucoma of right eye   . Cancer [facility])    bladder and prostate  . Carotid bruit    per duplex 03-11-2016 RICA 1-39%  . Chronic throat clearing   . COVID-19   . DDD (degenerative disc disease), lumbar   . Depression   . ED (erectile dysfunction)   . Elevated PSA    urologist-  dr [name]--- s/p  prostate bx's  . GERD (gastroesophageal reflux disease)   . Hematuria   . History of chronic bronchitis   . 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 History of squamous cell carcinoma  excision    several times  . Hyperlipidemia   . Pre-diabetes   . Premature ventricular contractions (PVCs) (VPCs)   . RAD (reactive airway disease)   . 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 Sensorineural hearing loss (SNHL) of both ears   . Sepsis ([place]) 09/2016  . UTI (urinary tract infection) 08/2016  . Wears glasses   . Wears hearing aid    bilateral    Past Surgical History:  Procedure Laterality Date  . APPENDECTOMY  2008  . CARDIOVASCULAR STRESS TEST  05/27/2010   normal nuclear study w/ no ischemia/  normal LV function and wall motion , ef 60%  . CYSTOSCOPY WITH INJECTION N/A 06/12/2016   Procedure: CYSTOSCOPY WITH INJECTION OF INDOCYANINE GREEN DYE;  Surgeon: Alexis Frock, MD;  Location: [facility];  Service: Urology;  Laterality: N/A;  . DEEP NECK LYMPH NODE BIOPSY / EXCISION  1970's   and Bronchoscopy w/ bx's ( dx Sarcoidosis)  . ILEOSTOMY    . IR NEPHROSTOMY PLACEMENT LEFT  11/18/2016  . MOHS SURGERY  2013 approx.    nasal; pre melanoma  . PARS PLANA VITRECTOMY Right 2007   repair macular pucker  . TONSILLECTOMY AND ADENOIDECTOMY  child  . TRANSURETHRAL RESECTION OF BLADDER TUMOR N/A 04/06/2016   Procedure: CYSTOSCOPY TRANSURETHRAL RESECTION OF BLADDER TUMORS (TURBT);  Surgeon: Carolan Clines, MD;  Location: [facility];  Service: Urology;  Laterality: N/A;  . TRANSURETHRAL RESECTION OF PROSTATE      Social History   Socioeconomic History  . Marital status: Married    Spouse name: Not on file  . Number of children: Not on file  . Years of education: Not on file  . Highest education level: Not on file  Occupational History  . Not on file  Tobacco Use  . Smoking status: Never Smoker  . Smokeless tobacco: Never Used  Vaping Use  . Vaping Use: Never used  Substance and Sexual Activity  . Alcohol use: No  . Drug use: No  . Sexual activity: Not Currently  Other  Topics Concern  . Not on file  Social History Narrative  . Not on file   Social Determinants of Health   Financial Resource Strain: Not on file  Food Insecurity: Not on file  Transportation Needs: Not on file  Physical Activity: Not on file  Stress: Not on file  Social Connections: Not on file    Family History  Problem Relation Age of Onset  . Stroke Mother        in her 30s  . Breast cancer Sister   . Heart disease Maternal Grandmother 38  MI   . Breast cancer Paternal Grandmother   . Heart disease Paternal Grandfather 11       MI  . Stroke Brother   . Healthy Father   . Diabetes Neg Hx     Review of Systems  Constitutional: Negative for appetite change, chills and fever.  Respiratory: Positive for cough (residual from bronchitis). Negative for shortness of breath and wheezing.   Cardiovascular: Negative for chest pain, palpitations and leg swelling.  Gastrointestinal: Negative for abdominal pain, blood in stool, constipation, diarrhea and nausea.  Genitourinary: Negative for hematuria.       No change in urine - has urostomy  Neurological: Positive for headaches. Negative for light-headedness.       Objective:   Vitals:   08/02/20 0806  BP: 112/70  Pulse: 88  Temp: 98.3 F (36.8 C)  SpO2: 95%   BP Readings from Last 3 Encounters:  08/02/20 112/70  07/03/20 114/70  02/16/20 116/78   Wt Readings from Last 3 Encounters:  08/02/20 172 lb (78 kg)  07/03/20 173 lb (78.5 kg)  02/16/20 181 lb (82.1 kg)   Body mass index is 26.94 kg/m.   Physical Exam    Constitutional: Appears well-developed and well-nourished. No distress.  HENT:  Head: Normocephalic and atraumatic.  Neck: Neck supple. No tracheal deviation present. No thyromegaly present.  No cervical lymphadenopathy Cardiovascular: Normal rate, regular rhythm and normal heart sounds.   No murmur heard. No carotid bruit .  No edema Pulmonary/Chest: Effort normal and breath sounds normal. No  respiratory distress. No has no wheezes. No rales.  Msk:  No spine tenderness, no CVA tenderness, no tenderness in right lower back Skin: Skin is warm and dry. Not diaphoretic.  Psychiatric: Normal mood and affect. Behavior is normal.      Assessment & Plan:    See Problem List for Assessment and Plan of chronic medical problems.    This visit occurred during the SARS-CoV-2 public health emergency.  Safety protocols were in place, including screening questions prior to the visit, additional usage of staff PPE, and extensive cleaning of exam room while observing appropriate contact time as indicated for disinfecting solutions.

## 2020-08-02 ENCOUNTER — Ambulatory Visit (INDEPENDENT_AMBULATORY_CARE_PROVIDER_SITE_OTHER): Payer: Medicare Other | Admitting: Internal Medicine

## 2020-08-02 ENCOUNTER — Other Ambulatory Visit: Payer: Self-pay

## 2020-08-02 ENCOUNTER — Encounter: Payer: Self-pay | Admitting: Internal Medicine

## 2020-08-02 VITALS — BP 112/70 | HR 88 | Temp 98.3°F | Ht 67.0 in | Wt 172.0 lb

## 2020-08-02 DIAGNOSIS — R7303 Prediabetes: Secondary | ICD-10-CM | POA: Diagnosis not present

## 2020-08-02 DIAGNOSIS — I5032 Chronic diastolic (congestive) heart failure: Secondary | ICD-10-CM | POA: Diagnosis not present

## 2020-08-02 DIAGNOSIS — F3289 Other specified depressive episodes: Secondary | ICD-10-CM | POA: Diagnosis not present

## 2020-08-02 DIAGNOSIS — M545 Low back pain, unspecified: Secondary | ICD-10-CM

## 2020-08-02 DIAGNOSIS — J4520 Mild intermittent asthma, uncomplicated: Secondary | ICD-10-CM | POA: Diagnosis not present

## 2020-08-02 DIAGNOSIS — E7849 Other hyperlipidemia: Secondary | ICD-10-CM | POA: Diagnosis not present

## 2020-08-02 LAB — CBC WITH DIFFERENTIAL/PLATELET
Basophils Absolute: 0.1 10*3/uL (ref 0.0–0.1)
Basophils Relative: 0.7 % (ref 0.0–3.0)
Eosinophils Absolute: 0.2 10*3/uL (ref 0.0–0.7)
Eosinophils Relative: 2.1 % (ref 0.0–5.0)
HCT: 40.4 % (ref 39.0–52.0)
Hemoglobin: 13.4 g/dL (ref 13.0–17.0)
Lymphocytes Relative: 17.6 % (ref 12.0–46.0)
Lymphs Abs: 1.9 10*3/uL (ref 0.7–4.0)
MCHC: 33 g/dL (ref 30.0–36.0)
MCV: 89.9 fl (ref 78.0–100.0)
Monocytes Absolute: 0.7 10*3/uL (ref 0.1–1.0)
Monocytes Relative: 6.3 % (ref 3.0–12.0)
Neutro Abs: 7.8 10*3/uL — ABNORMAL HIGH (ref 1.4–7.7)
Neutrophils Relative %: 73.3 % (ref 43.0–77.0)
Platelets: 403 10*3/uL — ABNORMAL HIGH (ref 150.0–400.0)
RBC: 4.5 Mil/uL (ref 4.22–5.81)
RDW: 13.4 % (ref 11.5–15.5)
WBC: 10.6 10*3/uL — ABNORMAL HIGH (ref 4.0–10.5)

## 2020-08-02 LAB — HEMOGLOBIN A1C: Hgb A1c MFr Bld: 5.9 % (ref 4.6–6.5)

## 2020-08-02 NOTE — Patient Instructions (Addendum)
  Blood work was ordered.     Medications changes include : none      If your back pain does not resolve please let me know.    Please followup in 1 year

## 2020-08-03 DIAGNOSIS — M545 Low back pain, unspecified: Secondary | ICD-10-CM | POA: Insufficient documentation

## 2020-08-03 NOTE — Assessment & Plan Note (Signed)
Chronic Check lipid panel Lifestyle controlled Encouraged regular exercise and healthy diet

## 2020-08-03 NOTE — Assessment & Plan Note (Signed)
Chronic Taking advair daily Continue singulair 10 mg nightly controlled

## 2020-08-03 NOTE — Assessment & Plan Note (Signed)
Acute in nature Of right lower back without radiation Worse with bending and movement, better with Tylenol and rest No urological symptoms Continue Tylenol, ice, heat and topical muscular/arthritis medications Call if no improvement

## 2020-08-03 NOTE — Assessment & Plan Note (Signed)
Chronic Check a1c Low sugar / carb diet Stressed regular exercise  

## 2020-08-03 NOTE — Assessment & Plan Note (Signed)
Chronic  euvolemic 

## 2020-08-03 NOTE — Assessment & Plan Note (Signed)
Chronic Controlled, stable Continue Effexor 75 mg daily

## 2020-08-05 LAB — COMPREHENSIVE METABOLIC PANEL
ALT: 8 U/L (ref 0–53)
AST: 19 U/L (ref 0–37)
Albumin: 3.8 g/dL (ref 3.5–5.2)
Alkaline Phosphatase: 98 U/L (ref 39–117)
BUN: 15 mg/dL (ref 6–23)
CO2: 26 mEq/L (ref 19–32)
Calcium: 9.3 mg/dL (ref 8.4–10.5)
Chloride: 105 mEq/L (ref 96–112)
Creatinine, Ser: 1.05 mg/dL (ref 0.40–1.50)
GFR: 67.24 mL/min (ref >60.00)
Glucose, Bld: 97 mg/dL (ref 70–99)
Potassium: 4.5 mEq/L (ref 3.5–5.1)
Sodium: 140 mEq/L (ref 135–145)
Total Bilirubin: 0.5 mg/dL (ref 0.2–1.2)
Total Protein: 7.3 g/dL (ref 6.0–8.3)

## 2020-08-05 LAB — LIPID PANEL
Cholesterol: 146 mg/dL (ref 0–200)
HDL: 41.5 mg/dL (ref >39.00)
LDL Cholesterol: 79 mg/dL (ref 0–99)
NonHDL: 104.71
Total CHOL/HDL Ratio: 4
Triglycerides: 130 mg/dL (ref 0.0–149.0)
VLDL: 26 mg/dL (ref 0.0–40.0)

## 2020-08-14 ENCOUNTER — Other Ambulatory Visit: Payer: Self-pay | Admitting: Internal Medicine

## 2020-08-16 ENCOUNTER — Ambulatory Visit: Payer: Medicare Other | Admitting: Internal Medicine

## 2020-09-05 ENCOUNTER — Telehealth: Payer: Self-pay | Admitting: Internal Medicine

## 2020-09-05 ENCOUNTER — Telehealth: Payer: Self-pay

## 2020-09-05 NOTE — Telephone Encounter (Signed)
Appointment made for tomorrow morning. 

## 2020-09-05 NOTE — Telephone Encounter (Signed)
   Patient called and said that he is feeling a little better but he is still coughing and has some mucus. He said that he was been taking Mucinex. Patient was wondering if there was anything else that he would need to do. Patient was last seen 08-02-20. He can be reached at [phone]

## 2020-09-05 NOTE — Telephone Encounter (Signed)
Patient called stating he was still dealing with a productive cough still from being diagnosed with bronchitis a month ago.  Patient was seen at an urgent care and diagnosed and also saw his pcp who prescribed doxycycline. Patient wanting know what he should do in regards to the cough. Patient advised he should follow back up with his pcp if he feels he is not improving. Patient advised he has not been seen here in our office in 3 years and it was for recurrent UTIs.

## 2020-09-05 NOTE — Telephone Encounter (Signed)
Probably needs to be reevaluated since it has been over a month.

## 2020-09-05 NOTE — Progress Notes (Signed)
Subjective:    Patient ID: Adrian Rios, male    DOB: 1940/11/14, 80 y.o.   MRN: [id number]  HPI The patient is here for an acute visit.  06/22/20 - [facility] - dx bronchitis.   Took zpak, tessalon.  Not better - 5/2 rx'd augmentin.    5/4 - I saw him - had diarrhea from augmentin -Stopped it and started doxycycline x 10 days  6/3 - still had cough, but was better.  The cough was productive of a little phlegm.  No SOB, wheeze, fever/chills.  He was taking advair and singulair    He still has the cough.   It is intermittent.  Every time he coughs he coughs up yellow mucus.  He does not feel bad.  He denies SOB, wheeze, chest tightness, fever.    He uses Adviar once a day before he goes to bed.  He is taking singular nightly and nasacort.  He also uses the albuterol at night-he is unsure how much it helps, but has just been using it nightly.     Medications and allergies reviewed with patient and updated if appropriate.  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[place]) 06/12/2016   Carotid bruit 02/17/2016   Prediabetes 08/20/2015   Overweight (BMI 25.0-29.9) 08/20/2015   Glaucoma 02/14/2015   Varicose veins of lower extremities with other complications 03/10/3233   RAD (reactive airway disease) 10/27/2011   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     ADVAIR DISKUS 250-50 MCG/DOSE AEPB INHALE 1 PUFF INTO THE LUNGS EVERY 12 HOURS 60 each 5   albuterol (VENTOLIN HFA) 108 (90 Base) MCG/ACT inhaler Inhale 2 puffs into the lungs every 6 (six) hours as needed for wheezing or shortness of breath. 1 each 8   latanoprost (XALATAN) 0.005 % ophthalmic solution SMARTSIG:In Eye(s)     magnesium oxide (MAG-OX) 400 MG tablet Take 400 mg by mouth daily.     Misc Natural Products (GLUCOSAMINE CHOND DOUBLE STR PO) Take 1 tablet by mouth 2 (two) times daily.     montelukast (SINGULAIR) 10 MG tablet TAKE ONE TABLET BY MOUTH DAILY 90 tablet 0   triamcinolone (NASACORT) 55 MCG/ACT AERO nasal inhaler Place 1 spray into the nose at bedtime.     venlafaxine (EFFEXOR) 75 MG tablet TAKE ONE TABLET BY MOUTH DAILY 90 tablet 1   No current facility-administered medications on file prior to visit.    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    s/p  MOH's nasal --  pre-melanoma   History of squamous cell carcinoma excision     several times   Hyperlipidemia    Pre-diabetes    Premature ventricular contractions (PVCs) (VPCs)    RAD (reactive airway disease)    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[place])    dx 1970's  s/p  deep neck lymph node bx's and lung bx's   Sensorineural hearing loss (SNHL) of both ears    Sepsis ([place]) 09/2016   UTI (urinary tract infection) 08/2016   Wears glasses    Wears hearing aid    bilateral    Past Surgical History:  Procedure Laterality Date   APPENDECTOMY  2008   CARDIOVASCULAR STRESS TEST  05/27/2010   normal nuclear study w/ no ischemia/  normal LV function and wall motion , ef 60%   CYSTOSCOPY WITH INJECTION N/A 06/12/2016   Procedure: CYSTOSCOPY WITH INJECTION OF INDOCYANINE GREEN DYE;  Surgeon: Alexis Frock, MD;  Location: [facility];  Service: Urology;  Laterality: N/A;   DEEP NECK LYMPH NODE BIOPSY / EXCISION  1970's   and Bronchoscopy w/ bx's ( dx Sarcoidosis)   ILEOSTOMY     IR NEPHROSTOMY PLACEMENT LEFT  11/18/2016   MOHS SURGERY  2013 approx.    nasal; pre melanoma   PARS PLANA VITRECTOMY Right 2007   repair macular pucker   TONSILLECTOMY AND ADENOIDECTOMY  child   TRANSURETHRAL RESECTION OF BLADDER TUMOR N/A 04/06/2016   Procedure: CYSTOSCOPY TRANSURETHRAL RESECTION OF BLADDER TUMORS (TURBT);  Surgeon: Carolan Clines, MD;  Location: [facility];  Service: Urology;  Laterality: N/A;   TRANSURETHRAL RESECTION OF PROSTATE      Social History   Socioeconomic History   Marital status: Married    Spouse name: Not on file   Number of children: Not on file   Years of education: Not on file   Highest education level: Not on file  Occupational History   Not on file  Tobacco Use   Smoking status: Never   Smokeless tobacco: Never  Vaping Use   Vaping Use: Never used  Substance and Sexual Activity   Alcohol use: No   Drug use: No   Sexual activity: Not Currently  Other Topics Concern   Not on file  Social History Narrative    Not on file   Social Determinants of Health   Financial Resource Strain: Not on file  Food Insecurity: Not on file  Transportation Needs: Not on file  Physical Activity: Not on file  Stress: Not on file  Social Connections: Not on file    Family History  Problem Relation Age of Onset   Stroke Mother        in her 20s   Breast cancer Sister  Heart disease Maternal Grandmother 1       MI    Breast cancer Paternal Grandmother    Heart disease Paternal Grandfather 56       MI   Stroke Brother    Healthy Father    Diabetes Neg Hx     Review of Systems  Constitutional:  Negative for appetite change, chills and fever.  HENT:  Negative for congestion, ear pain, sinus pressure and sore throat.   Respiratory:  Positive for cough. Negative for chest tightness, shortness of breath and wheezing.   Gastrointestinal:  Negative for abdominal pain and nausea.       Gerd controlled with otc medication      Objective:   Vitals:   09/06/20 0748  BP: 106/70  Pulse: 77  Temp: 98.3 F (36.8 C)  SpO2: 96%   BP Readings from Last 3 Encounters:  09/06/20 106/70  08/02/20 112/70  07/03/20 114/70   Wt Readings from Last 3 Encounters:  09/06/20 172 lb 3.2 oz (78.1 kg)  08/02/20 172 lb (78 kg)  07/03/20 173 lb (78.5 kg)   Body mass index is 26.97 kg/m.   Physical Exam    GENERAL APPEARANCE: Appears stated age, well appearing, NAD EYES: conjunctiva clear, no icterus HENT: bilateral tympanic membranes and ear canals normal, oropharynx with no erythema or exudates, trachea midline, no cervical or supraclavicular lymphadenopathy LUNGS: Unlabored breathing, good air entry bilaterally, clear to auscultation without wheeze or crackles CARDIOVASCULAR: Normal S1,S2 , no edema SKIN: Warm, dry      Assessment & Plan:    See Problem List for Assessment and Plan of chronic medical problems.    This visit occurred during the SARS-CoV-2 public health emergency.  Safety protocols  were in place, including screening questions prior to the visit, additional usage of staff PPE, and extensive cleaning of exam room while observing appropriate contact time as indicated for disinfecting solutions.

## 2020-09-06 ENCOUNTER — Other Ambulatory Visit: Payer: Self-pay

## 2020-09-06 ENCOUNTER — Ambulatory Visit (INDEPENDENT_AMBULATORY_CARE_PROVIDER_SITE_OTHER): Payer: Medicare Other | Admitting: Internal Medicine

## 2020-09-06 ENCOUNTER — Ambulatory Visit (INDEPENDENT_AMBULATORY_CARE_PROVIDER_SITE_OTHER): Payer: Medicare Other

## 2020-09-06 ENCOUNTER — Encounter: Payer: Self-pay | Admitting: Internal Medicine

## 2020-09-06 VITALS — BP 106/70 | HR 77 | Temp 98.3°F | Ht 67.0 in | Wt 172.2 lb

## 2020-09-06 DIAGNOSIS — R059 Cough, unspecified: Secondary | ICD-10-CM | POA: Diagnosis not present

## 2020-09-06 MED ORDER — CEFDINIR 300 MG PO CAPS: 300.0000 mg | ORAL_CAPSULE | Freq: Two times a day (BID) | ORAL | 0 refills | Status: DC

## 2020-09-06 NOTE — Patient Instructions (Signed)
   Have a chest xray downstairs today.    Medications changes include :   omnicef twice daily for ten days.   Your prescription(s) have been submitted to your pharmacy. Please take as directed and contact our office if you believe you are having problem(s) with the medication(s).   Increase your advair to twice daily for at least two weeks.

## 2020-09-06 NOTE — Assessment & Plan Note (Addendum)
Subacute Persisting cough since bronchitis that started in April No other concerning symptoms.  Cough is productive of a small amount of yellow mucus ?  Slight residual infection, developed chronic bronchitis or bronchiectasis GERD controlled with current medication Will check chest x-ray today Advised him to use the Advair twice a day for the next couple of weeks to see if that helps, albuterol as needed Start Omnicef 300 mg twice daily x10 days for possible residual infection Depending on chest x-ray and how he does with the antibiotic may need to consider CT chest or pulmonary evaluation

## 2020-09-27 DIAGNOSIS — U071 COVID-19: Secondary | ICD-10-CM | POA: Diagnosis not present

## 2020-10-01 ENCOUNTER — Telehealth: Payer: Self-pay

## 2020-10-01 DIAGNOSIS — H9193 Unspecified hearing loss, bilateral: Secondary | ICD-10-CM

## 2020-10-01 NOTE — Addendum Note (Signed)
Addended by: [name] on: 10/01/2020 12:05 PM   Modules accepted: Orders

## 2020-10-01 NOTE — Telephone Encounter (Signed)
pt has stated he needs a referral sent over to [facility] for him to receive new hearing aids.  TEL: [phone]

## 2020-10-02 ENCOUNTER — Telehealth: Payer: Self-pay | Admitting: Internal Medicine

## 2020-10-02 DIAGNOSIS — R059 Cough, unspecified: Secondary | ICD-10-CM

## 2020-10-02 NOTE — Telephone Encounter (Signed)
Referral ordered

## 2020-10-02 NOTE — Telephone Encounter (Signed)
Patient requesting referral to :  [facility] [address] Phone [phone]  Reason: continued coughing, bronchitis,sarcoidosis

## 2020-10-07 ENCOUNTER — Encounter: Payer: Self-pay | Admitting: Internal Medicine

## 2020-10-09 MED ORDER — VENLAFAXINE HCL 75 MG PO TABS: 112.5000 mg | ORAL_TABLET | Freq: Every day | ORAL | 1 refills | Status: DC

## 2020-10-16 ENCOUNTER — Encounter: Payer: Self-pay | Admitting: Internal Medicine

## 2020-10-19 ENCOUNTER — Other Ambulatory Visit: Payer: Self-pay | Admitting: Internal Medicine

## 2020-10-22 DIAGNOSIS — H903 Sensorineural hearing loss, bilateral: Secondary | ICD-10-CM | POA: Diagnosis not present

## 2020-10-29 DIAGNOSIS — R053 Chronic cough: Secondary | ICD-10-CM | POA: Diagnosis not present

## 2020-11-01 ENCOUNTER — Ambulatory Visit (INDEPENDENT_AMBULATORY_CARE_PROVIDER_SITE_OTHER): Payer: Medicare Other

## 2020-11-01 ENCOUNTER — Other Ambulatory Visit: Payer: Self-pay

## 2020-11-01 VITALS — BP 102/70 | HR 81 | Temp 97.8°F | Ht 67.0 in | Wt 169.0 lb

## 2020-11-01 DIAGNOSIS — Z0000 Encounter for general adult medical examination without abnormal findings: Secondary | ICD-10-CM

## 2020-11-01 NOTE — Patient Instructions (Signed)
Adrian Rios , Thank you for taking time to come for your Medicare Wellness Visit. I appreciate your ongoing commitment to your health goals. Please review the following plan we discussed and let me know if I can assist you in the future.   Screening recommendations/referrals: Colonoscopy: last done Cologuard on 12/03/2017 Recommended yearly ophthalmology/optometry visit for glaucoma screening and checkup Recommended yearly dental visit for hygiene and checkup  Vaccinations: Influenza vaccine: due Fall 2022 Pneumococcal vaccine: 12/28/2012, 08/20/2015 Tdap vaccine: 02/22/2011; due every 10 years Shingles vaccine: never done; can check with local pharmacy   Covid-19: 05/01/2019, 05/25/2019, 03/28/2020  Advanced directives: Please bring a copy of your health care power of attorney and living will to the office at your convenience.  Conditions/risks identified: Yes; Client understands the importance of follow-up with providers by attending scheduled visits and discussed goals to eat healthier, increase physical activity, exercise the brain, socialize more, get enough sleep and make time for laughter.  Next appointment: Please schedule your next Medicare Wellness Visit with your Nurse Health Advisor in 1 year by calling [phone].  Preventive Care 80 Years and Older, Male Preventive care refers to lifestyle choices and visits with your health care provider that can promote health and wellness. What does preventive care include? A yearly physical exam. This is also called an annual well check. Dental exams once or twice a year. Routine eye exams. Ask your health care provider how often you should have your eyes checked. Personal lifestyle choices, including: Daily care of your teeth and gums. Regular physical activity. Eating a healthy diet. Avoiding tobacco and drug use. Limiting alcohol use. Practicing safe sex. Taking low doses of aspirin every day. Taking vitamin and mineral supplements  as recommended by your health care provider. What happens during an annual well check? The services and screenings done by your health care provider during your annual well check will depend on your age, overall health, lifestyle risk factors, and family history of disease. Counseling  Your health care provider may ask you questions about your: Alcohol use. Tobacco use. Drug use. Emotional well-being. Home and relationship well-being. Sexual activity. Eating habits. History of falls. Memory and ability to understand (cognition). Work and work Statistician. Screening  You may have the following tests or measurements: Height, weight, and BMI. Blood pressure. Lipid and cholesterol levels. These may be checked every 5 years, or more frequently if you are over 5 years old. Skin check. Lung cancer screening. You may have this screening every year starting at age 72 if you have a 30-pack-year history of smoking and currently smoke or have quit within the past 15 years. Fecal occult blood test (FOBT) of the stool. You may have this test every year starting at age 63. Flexible sigmoidoscopy or colonoscopy. You may have a sigmoidoscopy every 5 years or a colonoscopy every 10 years starting at age 6. Prostate cancer screening. Recommendations will vary depending on your family history and other risks. Hepatitis C blood test. Hepatitis B blood test. Sexually transmitted disease (STD) testing. Diabetes screening. This is done by checking your blood sugar (glucose) after you have not eaten for a while (fasting). You may have this done every 1-3 years. Abdominal aortic aneurysm (AAA) screening. You may need this if you are a current or former smoker. Osteoporosis. You may be screened starting at age 42 if you are at high risk. Talk with your health care provider about your test results, treatment options, and if necessary, the need for more tests. Vaccines  Your health care provider may recommend  certain vaccines, such as: Influenza vaccine. This is recommended every year. Tetanus, diphtheria, and acellular pertussis (Tdap, Td) vaccine. You may need a Td booster every 10 years. Zoster vaccine. You may need this after age 6. Pneumococcal 13-valent conjugate (PCV13) vaccine. One dose is recommended after age 29. Pneumococcal polysaccharide (PPSV23) vaccine. One dose is recommended after age 22. Talk to your health care provider about which screenings and vaccines you need and how often you need them. This information is not intended to replace advice given to you by your health care provider. Make sure you discuss any questions you have with your health care provider. Document Released: 03/15/2015 Document Revised: 11/06/2015 Document Reviewed: 12/18/2014 Elsevier Interactive Patient Education  2017 [employer] Prevention in the Home Falls can cause injuries. They can happen to people of all ages. There are many things you can do to make your home safe and to help prevent falls. What can I do on the outside of my home? Regularly fix the edges of walkways and driveways and fix any cracks. Remove anything that might make you trip as you walk through a door, such as a raised step or threshold. Trim any bushes or trees on the path to your home. Use bright outdoor lighting. Clear any walking paths of anything that might make someone trip, such as rocks or tools. Regularly check to see if handrails are loose or broken. Make sure that both sides of any steps have handrails. Any raised decks and porches should have guardrails on the edges. Have any leaves, snow, or ice cleared regularly. Use sand or salt on walking paths during winter. Clean up any spills in your garage right away. This includes oil or grease spills. What can I do in the bathroom? Use night lights. Install grab bars by the toilet and in the tub and shower. Do not use towel bars as grab bars. Use non-skid mats or  decals in the tub or shower. If you need to sit down in the shower, use a plastic, non-slip stool. Keep the floor dry. Clean up any water that spills on the floor as soon as it happens. Remove soap buildup in the tub or shower regularly. Attach bath mats securely with double-sided non-slip rug tape. Do not have throw rugs and other things on the floor that can make you trip. What can I do in the bedroom? Use night lights. Make sure that you have a light by your bed that is easy to reach. Do not use any sheets or blankets that are too big for your bed. They should not hang down onto the floor. Have a firm chair that has side arms. You can use this for support while you get dressed. Do not have throw rugs and other things on the floor that can make you trip. What can I do in the kitchen? Clean up any spills right away. Avoid walking on wet floors. Keep items that you use a lot in easy-to-reach places. If you need to reach something above you, use a strong step stool that has a grab bar. Keep electrical cords out of the way. Do not use floor polish or wax that makes floors slippery. If you must use wax, use non-skid floor wax. Do not have throw rugs and other things on the floor that can make you trip. What can I do with my stairs? Do not leave any items on the stairs. Make sure that there are  handrails on both sides of the stairs and use them. Fix handrails that are broken or loose. Make sure that handrails are as long as the stairways. Check any carpeting to make sure that it is firmly attached to the stairs. Fix any carpet that is loose or worn. Avoid having throw rugs at the top or bottom of the stairs. If you do have throw rugs, attach them to the floor with carpet tape. Make sure that you have a light switch at the top of the stairs and the bottom of the stairs. If you do not have them, ask someone to add them for you. What else can I do to help prevent falls? Wear shoes that: Do not  have high heels. Have rubber bottoms. Are comfortable and fit you well. Are closed at the toe. Do not wear sandals. If you use a stepladder: Make sure that it is fully opened. Do not climb a closed stepladder. Make sure that both sides of the stepladder are locked into place. Ask someone to hold it for you, if possible. Clearly mark and make sure that you can see: Any grab bars or handrails. First and last steps. Where the edge of each step is. Use tools that help you move around (mobility aids) if they are needed. These include: Canes. Walkers. Scooters. Crutches. Turn on the lights when you go into a dark area. Replace any light bulbs as soon as they burn out. Set up your furniture so you have a clear path. Avoid moving your furniture around. If any of your floors are uneven, fix them. If there are any pets around you, be aware of where they are. Review your medicines with your doctor. Some medicines can make you feel dizzy. This can increase your chance of falling. Ask your doctor what other things that you can do to help prevent falls. This information is not intended to replace advice given to you by your health care provider. Make sure you discuss any questions you have with your health care provider. Document Released: 12/13/2008 Document Revised: 07/25/2015 Document Reviewed: 03/23/2014 Elsevier Interactive Patient Education  2017 [employer].

## 2020-11-01 NOTE — Progress Notes (Signed)
Subjective:   Adrian Rios is a 80 y.o. male who presents for Medicare Annual/Subsequent preventive examination.  Review of Systems     Cardiac Risk Factors include: advanced age (>1mn, >>3women);dyslipidemia;family history of premature cardiovascular disease;male gender     Objective:    Today's Vitals   11/01/20 1603  BP: 102/70  Pulse: 81  Temp: 97.8 F (36.6 C)  SpO2: 96%  Weight: 169 lb (76.7 kg)  Height: '5\' 7"'$  (1.702 m)  PainSc: 0-No pain   Body mass index is 26.47 kg/m.  Advanced Directives 11/01/2020 08/08/2018 02/09/2018 05/26/2017 11/12/2016 11/11/2016 10/29/2016  Does Patient Have a Medical Advance Directive? Yes Yes No Yes No - No  Type of Advance Directive Living will;Healthcare Power of AGeorgeLiving will Living will Living will Living will  Does patient want to make changes to medical advance directive? No - Patient declined Yes (ED - Information included in AVS) - - No - Patient declined - -  Copy of [place] Chart? No - copy requested - - No - copy requested No - copy requested - -  Would patient like information on creating a medical advance directive? - - No - Patient declined - No - Patient declined - -    Current Medications (verified) Outpatient Encounter Medications as of 11/01/2020  Medication Sig   acetaminophen (TYLENOL) 500 MG tablet Take 500 mg by mouth every 6 (six) hours as needed.   ADVAIR DISKUS 250-50 MCG/DOSE AEPB INHALE 1 PUFF INTO THE LUNGS EVERY 12 HOURS   albuterol (VENTOLIN HFA) 108 (90 Base) MCG/ACT inhaler Inhale 2 puffs into the lungs every 6 (six) hours as needed for wheezing or shortness of breath.   latanoprost (XALATAN) 0.005 % ophthalmic solution SMARTSIG:In Eye(s)   magnesium oxide (MAG-OX) 400 MG tablet Take 400 mg by mouth daily.   Misc Natural Products (GLUCOSAMINE CHOND DOUBLE STR PO) Take 1 tablet by mouth 2 (two) times daily.   montelukast (SINGULAIR) 10 MG tablet  TAKE ONE TABLET BY MOUTH DAILY   triamcinolone (NASACORT) 55 MCG/ACT AERO nasal inhaler Place 1 spray into the nose at bedtime.   venlafaxine (EFFEXOR) 75 MG tablet Take 1.5 tablets (112.5 mg total) by mouth daily.   [DISCONTINUED] cefdinir (OMNICEF) 300 MG capsule Take 1 capsule (300 mg total) by mouth 2 (two) times daily.   No facility-administered encounter medications on file as of 11/01/2020.    Allergies (verified) Ivp dye [iodinated diagnostic agents], Metrizamide, Shellfish allergy, Gabapentin, Niacin-lovastatin er, Gadolinium, Adhesive [tape], and Other   History: Past Medical History:  Diagnosis Date   Allergic rhinitis    Arthritis    Benign localized prostatic hyperplasia with lower urinary tract symptoms (LUTS)    Bladder tumor    Borderline glaucoma of right eye    Cancer ([facility]    bladder and prostate   Carotid bruit    per duplex 03-11-2016 RICA 1-39%   Chronic throat clearing    COVID-19    DDD (degenerative disc disease), lumbar    Depression    ED (erectile dysfunction)    Elevated PSA    urologist-  dr tGaynelle Arabian-- s/p  prostate bx's   GERD (gastroesophageal reflux disease)    Hematuria    History of chronic bronchitis    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    s/p  MOH's nasal --  pre-melanoma   History of squamous cell carcinoma excision    several times   Hyperlipidemia  Pre-diabetes    Premature ventricular contractions (PVCs) (VPCs)    RAD (reactive airway disease)    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  s/p  deep neck lymph node bx's and lung bx's   Sensorineural hearing loss (SNHL) of both ears    Sepsis ([place]) 09/2016   UTI (urinary tract infection) 08/2016   Wears glasses    Wears hearing aid    bilateral   Past Surgical History:  Procedure Laterality Date   APPENDECTOMY  2008   CARDIOVASCULAR STRESS TEST  05/27/2010   normal nuclear study w/ no ischemia/  normal LV function and wall motion , ef 60%    CYSTOSCOPY WITH INJECTION N/A 06/12/2016   Procedure: CYSTOSCOPY WITH INJECTION OF INDOCYANINE GREEN DYE;  Surgeon: Alexis Frock, MD;  Location: [facility];  Service: Urology;  Laterality: N/A;   DEEP NECK LYMPH NODE BIOPSY / EXCISION  1970's   and Bronchoscopy w/ bx's ( dx Sarcoidosis)   ILEOSTOMY     IR NEPHROSTOMY PLACEMENT LEFT  11/18/2016   MOHS SURGERY  2013 approx.    nasal; pre melanoma   PARS PLANA VITRECTOMY Right 2007   repair macular pucker   TONSILLECTOMY AND ADENOIDECTOMY  child   TRANSURETHRAL RESECTION OF BLADDER TUMOR N/A 04/06/2016   Procedure: CYSTOSCOPY TRANSURETHRAL RESECTION OF BLADDER TUMORS (TURBT);  Surgeon: Carolan Clines, MD;  Location: [facility];  Service: Urology;  Laterality: N/A;   TRANSURETHRAL RESECTION OF PROSTATE     Family History  Problem Relation Age of Onset   Stroke Mother        in her 47s   Breast cancer Sister    Heart disease Maternal Grandmother 61       MI    Breast cancer Paternal Grandmother    Heart disease Paternal Grandfather 28       MI   Stroke Brother    Healthy Father    Diabetes Neg Hx    Social History   Socioeconomic History   Marital status: Married    Spouse name: Not on file   Number of children: Not on file   Years of education: Not on file   Highest education level: Not on file  Occupational History   Not on file  Tobacco Use   Smoking status: Never   Smokeless tobacco: Never  Vaping Use   Vaping Use: Never used  Substance and Sexual Activity   Alcohol use: No   Drug use: No   Sexual activity: Not Currently  Other Topics Concern   Not on file  Social History Narrative   Not on file   Social Determinants of Health   Financial Resource Strain: Low Risk    Difficulty of Paying Living Expenses: Not hard at all  Food Insecurity: No Food Insecurity   Worried About Charity fundraiser in the Last Year: Never true   [place] in the Last Year: Never true  Transportation Needs: No  Transportation Needs   Lack of Transportation (Medical): No   Lack of Transportation (Non-Medical): No  Physical Activity: Sufficiently Active   Days of Exercise per Week: 5 days   Minutes of Exercise per Session: 30 min  Stress: No Stress Concern Present   Feeling of Stress : Not at all  Social Connections: Socially Integrated   Frequency of Communication with Friends and Family: More than three times a week   Frequency of Social Gatherings with  Friends and Family: More than three times a week   Attends Religious Services: More than 4 times per year   Active Member of Clubs or Organizations: Yes   Attends Music therapist: More than 4 times per year   Marital Status: Married    Tobacco Counseling Counseling given: Not Answered   Clinical Intake:  Pre-visit preparation completed: Yes  Pain : No/denies pain Pain Score: 0-No pain     BMI - recorded: 26.47 Nutritional Status: BMI 25 -29 Overweight Nutritional Risks: None Diabetes: No  How often do you need to have someone help you when you read instructions, pamphlets, or other written materials from your doctor or pharmacy?: 1 - Never What is the last grade level you completed in school?: High School Graduate  Diabetic? no  Interpreter Needed?: No  Information entered by :: Lisette Abu, LPN   Activities of Daily Living In your present state of health, do you have any difficulty performing the following activities: 11/01/2020 07/03/2020  Hearing? Y N  Vision? N N  Difficulty concentrating or making decisions? N N  Walking or climbing stairs? N N  Dressing or bathing? N N  Doing errands, shopping? N N  Preparing Food and eating ? N -  Using the Toilet? N -  In the past six months, have you accidently leaked urine? N -  Do you have problems with loss of bowel control? N -  Managing your Medications? N -  Managing your Finances? N -  Housekeeping or managing your Housekeeping? N -  Some recent data  might be hidden    Patient Care Team: [name], MD as PCP - General (Internal Medicine) Rolm Bookbinder, MD as Consulting Physician (Dermatology) Janalyn Harder, MD (Urology) Sudie Grumbling, MD as Referring Physician (Ophthalmology)  Indicate any recent Medical Services you may have received from other than [facility] providers in the past year (date may be approximate).     Assessment:   This is a routine wellness examination for Adrian Rios.  Hearing/Vision screen Hearing Screening - Comments:: Patient wears hearing aids. Vision Screening - Comments:: Patient wears eyeglasses.  Has right eye glaucoma.  Being treated by Dr[facility] Sudie [facility]umbling with [facility].   Dietary issues and exercise activities discussed: Current Exercise Habits: Home exercise routine, Type of exercise: walking, Time (Minutes): 30, Frequency (Times/Week): 5, Weekly Exercise (Minutes/Week): 150, Intensity: Moderate, Exercise limited by: respiratory conditions(s)   Goals Addressed   None   Depression Screen PHQ 2/9 Scores 11/01/2020 07/03/2020 08/08/2018 05/26/2017 02/04/2017 01/11/2017 08/19/2016  PHQ - 2 Score 0 0 1 0 0 0 0  PHQ- 9 Score - - 1 2 - - 7    Fall Risk Fall Risk  11/01/2020 07/03/2020 08/08/2018 05/26/2017 02/04/2017  Falls in the past year? 0 0 0 No No  Number falls in past yr: 0 0 0 - -  Injury with Fall? 0 0 - - -  Risk for fall due to : No Fall Risks No Fall Risks - - -  Follow up Falls evaluation completed Falls evaluation completed - - -    FALL RISK PREVENTION PERTAINING TO THE HOME:  Any stairs in or around the home? Yes  If so, are there any without handrails? No  Home free of loose throw rugs in walkways, pet beds, electrical cords, etc? Yes  Adequate lighting in your home to reduce risk of falls? Yes   ASSISTIVE DEVICES UTILIZED TO PREVENT FALLS:  Life alert? No  Use  of a cane, walker or w/c? No  Grab bars in the bathroom? Yes  Shower chair or bench in shower? No  Elevated toilet seat or a  handicapped toilet? Yes   TIMED UP AND GO:  Was the test performed? Yes .  Length of time to ambulate 10 feet: 6 sec.   Gait steady and fast without use of assistive device   Cognitive Function: Normal cognitive status assessed by direct observation by this Nurse Health Advisor. No abnormalities found.   MMSE - Mini Mental State Exam 05/26/2017  Orientation to time 5  Orientation to Place 5  Registration 3  Attention/ Calculation 5  Recall 1  Language- name 2 objects 2  Language- repeat 1  Language- follow 3 step command 3  Language- read & follow direction 1  Write a sentence 1  Copy design 1  Total score 28        Immunizations Immunization History  Administered Date(s) Administered   Fluad Quad(high Dose 65+) 10/28/2018   H1N1 04/12/2008   Influenza Split 12/16/2010, 01/06/2012   Influenza Whole 02/03/2007, 12/21/2007, 12/05/2009   Influenza, High Dose Seasonal PF 12/20/2012, 12/02/2015, 11/11/2017   Influenza,inj,Quad PF,6+ Mos 12/07/2013   Influenza-Unspecified 12/07/2013, 11/26/2017   PFIZER(Purple Top)SARS-COV-2 Vaccination 05/01/2019, 05/25/2019, 06/01/2019   Pneumococcal Conjugate-13 08/20/2015   Pneumococcal Polysaccharide-23 03/09/2002, 12/28/2012   Tetanus 02/22/2011   Varicella 07/21/2005   Zoster, Live 04/25/2006    TDAP status: Up to date  Flu Vaccine status: Due, Education has been provided regarding the importance of this vaccine. Advised may receive this vaccine at local pharmacy or Health Dept. Aware to provide a copy of the vaccination record if obtained from local pharmacy or Health Dept. Verbalized acceptance and understanding.  Pneumococcal vaccine status: Up to date  Covid-19 vaccine status: Completed vaccines  Qualifies for Shingles Vaccine? Yes   Zostavax completed Yes   Shingrix Completed?: No.    Education has been provided regarding the importance of this vaccine. Patient has been advised to call insurance company to determine out  of pocket expense if they have not yet received this vaccine. Advised may also receive vaccine at local pharmacy or Health Dept. Verbalized acceptance and understanding.  Screening Tests Health Maintenance  Topic Date Due   Zoster Vaccines- Shingrix (1 of 2) Never done   COVID-19 Vaccine (4 - Booster) 08/31/2019   INFLUENZA VACCINE  09/30/2020   TETANUS/TDAP  02/21/2021   PNA vac Low Risk Adult  Completed   HPV VACCINES  Aged Out    Health Maintenance  Health Maintenance Due  Topic Date Due   Zoster Vaccines- Shingrix (1 of 2) Never done   COVID-19 Vaccine (4 - Booster) 08/31/2019   INFLUENZA VACCINE  09/30/2020    Colorectal cancer screening: No longer required.   Lung Cancer Screening: (Low Dose CT Chest recommended if Age 72-80 years, 30 pack-year currently smoking OR have quit w/in 15years.) does not qualify.   Lung Cancer Screening Referral: no  Additional Screening:  Hepatitis C Screening: does not qualify; Completed no  Vision Screening: Recommended annual ophthalmology exams for early detection of glaucoma and other disorders of the eye. Is the patient up to date with their annual eye exam?  Yes  Who is the provider or what is the name of the office in which the patient attends annual eye exams? Dr. Sudie Grumbling at [facility] If pt is not established with a provider, would they like to be referred to a provider to establish care?  No .   Dental Screening: Recommended annual dental exams for proper oral hygiene  Community Resource Referral / Chronic Care Management: CRR required this visit?  No   CCM required this visit?  No      Plan:     I have personally reviewed and noted the following in the patient's chart:   Medical and social history Use of alcohol, tobacco or illicit drugs  Current medications and supplements including opioid prescriptions. Patient is not currently taking opioid prescriptions. Functional ability and status Nutritional  status Physical activity Advanced directives List of other physicians Hospitalizations, surgeries, and ER visits in previous 12 months Vitals Screenings to include cognitive, depression, and falls Referrals and appointments  In addition, I have reviewed and discussed with patient certain preventive protocols, quality metrics, and best practice recommendations. A written personalized care plan for preventive services as well as general preventive health recommendations were provided to patient.     Sheral Flow, LPN   [id number]   Nurse Notes:  Hearing Screening - Comments:: Patient wears hearing aids. Vision Screening - Comments:: Patient wears eyeglasses.  Has right eye glaucoma.  Being treated by Dr. Sudie Grumbling with [name].

## 2020-11-23 ENCOUNTER — Emergency Department (HOSPITAL_BASED_OUTPATIENT_CLINIC_OR_DEPARTMENT_OTHER): Payer: Medicare Other

## 2020-11-23 ENCOUNTER — Other Ambulatory Visit: Payer: Self-pay

## 2020-11-23 ENCOUNTER — Inpatient Hospital Stay (HOSPITAL_BASED_OUTPATIENT_CLINIC_OR_DEPARTMENT_OTHER)
Admission: EM | Admit: 2020-11-23 | Discharge: 2020-11-26 | DRG: 871 | Disposition: A | Discharge status: Home / Self Care | Payer: Medicare Other | Attending: Internal Medicine | Admitting: Internal Medicine

## 2020-11-23 ENCOUNTER — Encounter (HOSPITAL_BASED_OUTPATIENT_CLINIC_OR_DEPARTMENT_OTHER): Payer: Self-pay | Admitting: *Deleted

## 2020-11-23 DIAGNOSIS — J309 Allergic rhinitis, unspecified: Secondary | ICD-10-CM | POA: Diagnosis present

## 2020-11-23 DIAGNOSIS — Z87440 Personal history of urinary (tract) infections: Secondary | ICD-10-CM

## 2020-11-23 DIAGNOSIS — Z8551 Personal history of malignant neoplasm of bladder: Secondary | ICD-10-CM

## 2020-11-23 DIAGNOSIS — A419 Sepsis, unspecified organism: Principal | ICD-10-CM

## 2020-11-23 DIAGNOSIS — N390 Urinary tract infection, site not specified: Secondary | ICD-10-CM | POA: Diagnosis not present

## 2020-11-23 DIAGNOSIS — Z936 Other artificial openings of urinary tract status: Secondary | ICD-10-CM

## 2020-11-23 DIAGNOSIS — J150 Pneumonia due to Klebsiella pneumoniae: Secondary | ICD-10-CM | POA: Diagnosis present

## 2020-11-23 DIAGNOSIS — N1330 Unspecified hydronephrosis: Secondary | ICD-10-CM

## 2020-11-23 DIAGNOSIS — Z91013 Allergy to seafood: Secondary | ICD-10-CM

## 2020-11-23 DIAGNOSIS — Z8619 Personal history of other infectious and parasitic diseases: Secondary | ICD-10-CM

## 2020-11-23 DIAGNOSIS — F32A Depression, unspecified: Secondary | ICD-10-CM | POA: Diagnosis present

## 2020-11-23 DIAGNOSIS — N200 Calculus of kidney: Secondary | ICD-10-CM | POA: Diagnosis not present

## 2020-11-23 DIAGNOSIS — Z91041 Radiographic dye allergy status: Secondary | ICD-10-CM

## 2020-11-23 DIAGNOSIS — Z8616 Personal history of COVID-19: Secondary | ICD-10-CM

## 2020-11-23 DIAGNOSIS — M5136 Other intervertebral disc degeneration, lumbar region: Secondary | ICD-10-CM | POA: Diagnosis present

## 2020-11-23 DIAGNOSIS — Z20822 Contact with and (suspected) exposure to covid-19: Secondary | ICD-10-CM | POA: Diagnosis present

## 2020-11-23 DIAGNOSIS — Z888 Allergy status to other drugs, medicaments and biological substances status: Secondary | ICD-10-CM

## 2020-11-23 DIAGNOSIS — R509 Fever, unspecified: Secondary | ICD-10-CM | POA: Diagnosis not present

## 2020-11-23 DIAGNOSIS — N136 Pyonephrosis: Secondary | ICD-10-CM | POA: Diagnosis not present

## 2020-11-23 DIAGNOSIS — H40001 Preglaucoma, unspecified, right eye: Secondary | ICD-10-CM | POA: Diagnosis present

## 2020-11-23 DIAGNOSIS — K5730 Diverticulosis of large intestine without perforation or abscess without bleeding: Secondary | ICD-10-CM | POA: Diagnosis not present

## 2020-11-23 DIAGNOSIS — E785 Hyperlipidemia, unspecified: Secondary | ICD-10-CM | POA: Diagnosis present

## 2020-11-23 DIAGNOSIS — K219 Gastro-esophageal reflux disease without esophagitis: Secondary | ICD-10-CM | POA: Diagnosis present

## 2020-11-23 DIAGNOSIS — Z906 Acquired absence of other parts of urinary tract: Secondary | ICD-10-CM

## 2020-11-23 DIAGNOSIS — Z91048 Other nonmedicinal substance allergy status: Secondary | ICD-10-CM

## 2020-11-23 DIAGNOSIS — Z8546 Personal history of malignant neoplasm of prostate: Secondary | ICD-10-CM

## 2020-11-23 DIAGNOSIS — Z85828 Personal history of other malignant neoplasm of skin: Secondary | ICD-10-CM

## 2020-11-23 DIAGNOSIS — D869 Sarcoidosis, unspecified: Secondary | ICD-10-CM | POA: Diagnosis present

## 2020-11-23 DIAGNOSIS — N400 Enlarged prostate without lower urinary tract symptoms: Secondary | ICD-10-CM | POA: Diagnosis present

## 2020-11-23 DIAGNOSIS — Z974 Presence of external hearing-aid: Secondary | ICD-10-CM

## 2020-11-23 DIAGNOSIS — Z9079 Acquired absence of other genital organ(s): Secondary | ICD-10-CM

## 2020-11-23 LAB — URINALYSIS, COMPLETE (UACMP) WITH MICROSCOPIC
Bilirubin Urine: NEGATIVE
Glucose, UA: NEGATIVE mg/dL
Ketones, ur: NEGATIVE mg/dL
Nitrite: POSITIVE — AB
Protein, ur: NEGATIVE mg/dL
Specific Gravity, Urine: 1.01 (ref 1.005–1.030)
WBC, UA: >50 WBC/hpf (ref 0–5)
pH: 6 (ref 5.0–8.0)

## 2020-11-23 LAB — COMPREHENSIVE METABOLIC PANEL
ALT: 16 U/L (ref 0–44)
AST: 19 U/L (ref 15–41)
Albumin: 3.4 g/dL — ABNORMAL LOW (ref 3.5–5.0)
Alkaline Phosphatase: 107 U/L (ref 38–126)
Anion gap: 5 (ref 5–15)
BUN: 16 mg/dL (ref 8–23)
CO2: 25 mmol/L (ref 22–32)
Calcium: 8.6 mg/dL — ABNORMAL LOW (ref 8.9–10.3)
Chloride: 106 mmol/L (ref 98–111)
Creatinine, Ser: 1.09 mg/dL (ref 0.61–1.24)
GFR, Estimated: >60 mL/min (ref >60)
Glucose, Bld: 121 mg/dL — ABNORMAL HIGH (ref 70–99)
Potassium: 3.9 mmol/L (ref 3.5–5.1)
Sodium: 136 mmol/L (ref 135–145)
Total Bilirubin: 0.7 mg/dL (ref 0.3–1.2)
Total Protein: 7.5 g/dL (ref 6.5–8.1)

## 2020-11-23 LAB — CBC WITH DIFFERENTIAL/PLATELET
Abs Immature Granulocytes: 0.04 10*3/uL (ref 0.00–0.07)
Basophils Absolute: 0 10*3/uL (ref 0.0–0.1)
Basophils Relative: 0 %
Eosinophils Absolute: 0.1 10*3/uL (ref 0.0–0.5)
Eosinophils Relative: 1 %
HCT: 38.8 % — ABNORMAL LOW (ref 39.0–52.0)
Hemoglobin: 13.1 g/dL (ref 13.0–17.0)
Immature Granulocytes: 0 %
Lymphocytes Relative: 14 %
Lymphs Abs: 2.1 10*3/uL (ref 0.7–4.0)
MCH: 30.4 pg (ref 26.0–34.0)
MCHC: 33.8 g/dL (ref 30.0–36.0)
MCV: 90 fL (ref 80.0–100.0)
Monocytes Absolute: 1.1 10*3/uL — ABNORMAL HIGH (ref 0.1–1.0)
Monocytes Relative: 8 %
Neutro Abs: 11.6 10*3/uL — ABNORMAL HIGH (ref 1.7–7.7)
Neutrophils Relative %: 77 %
Platelets: 388 10*3/uL (ref 150–400)
RBC: 4.31 MIL/uL (ref 4.22–5.81)
RDW: 12.8 % (ref 11.5–15.5)
WBC: 14.9 10*3/uL — ABNORMAL HIGH (ref 4.0–10.5)
nRBC: 0 % (ref 0.0–0.2)

## 2020-11-23 LAB — RESP PANEL BY RT-PCR (FLU A&B, COVID) ARPGX2
Influenza A by PCR: NEGATIVE
Influenza B by PCR: NEGATIVE
SARS Coronavirus 2 by RT PCR: NEGATIVE

## 2020-11-23 LAB — LACTIC ACID, PLASMA: Lactic Acid, Venous: 0.8 mmol/L (ref 0.5–1.9)

## 2020-11-23 MED ORDER — SODIUM CHLORIDE 0.9 % IV SOLN
1.0000 g | Freq: Three times a day (TID) | INTRAVENOUS | Status: DC
  Administered 2020-11-23 – 2020-11-24 (×2): 1 g via INTRAVENOUS
  Filled 2020-11-23: qty 1

## 2020-11-23 MED ORDER — SODIUM CHLORIDE 0.9 % IV BOLUS
1000.0000 mL | Freq: Once | INTRAVENOUS | Status: AC
  Administered 2020-11-23: 1000 mL via INTRAVENOUS

## 2020-11-23 NOTE — ED Triage Notes (Addendum)
Pt reports fever x 2 days.  Hx of sepsis from kidney infection.  Reports negative home covid test.  Tylenol taken PTA

## 2020-11-24 ENCOUNTER — Emergency Department (HOSPITAL_BASED_OUTPATIENT_CLINIC_OR_DEPARTMENT_OTHER): Payer: Medicare Other

## 2020-11-24 DIAGNOSIS — J150 Pneumonia due to Klebsiella pneumoniae: Secondary | ICD-10-CM | POA: Diagnosis present

## 2020-11-24 DIAGNOSIS — Z20822 Contact with and (suspected) exposure to covid-19: Secondary | ICD-10-CM | POA: Diagnosis present

## 2020-11-24 DIAGNOSIS — N1330 Unspecified hydronephrosis: Secondary | ICD-10-CM | POA: Diagnosis not present

## 2020-11-24 DIAGNOSIS — D869 Sarcoidosis, unspecified: Secondary | ICD-10-CM | POA: Diagnosis present

## 2020-11-24 DIAGNOSIS — Z9079 Acquired absence of other genital organ(s): Secondary | ICD-10-CM | POA: Diagnosis not present

## 2020-11-24 DIAGNOSIS — N200 Calculus of kidney: Secondary | ICD-10-CM | POA: Diagnosis not present

## 2020-11-24 DIAGNOSIS — A419 Sepsis, unspecified organism: Secondary | ICD-10-CM

## 2020-11-24 DIAGNOSIS — Z8551 Personal history of malignant neoplasm of bladder: Secondary | ICD-10-CM | POA: Diagnosis not present

## 2020-11-24 DIAGNOSIS — N3000 Acute cystitis without hematuria: Secondary | ICD-10-CM | POA: Diagnosis not present

## 2020-11-24 DIAGNOSIS — F3289 Other specified depressive episodes: Secondary | ICD-10-CM

## 2020-11-24 DIAGNOSIS — N400 Enlarged prostate without lower urinary tract symptoms: Secondary | ICD-10-CM | POA: Diagnosis present

## 2020-11-24 DIAGNOSIS — Z936 Other artificial openings of urinary tract status: Secondary | ICD-10-CM

## 2020-11-24 DIAGNOSIS — N390 Urinary tract infection, site not specified: Secondary | ICD-10-CM | POA: Diagnosis not present

## 2020-11-24 DIAGNOSIS — E785 Hyperlipidemia, unspecified: Secondary | ICD-10-CM | POA: Diagnosis present

## 2020-11-24 DIAGNOSIS — A4151 Sepsis due to Escherichia coli [E. coli]: Secondary | ICD-10-CM

## 2020-11-24 DIAGNOSIS — N1339 Other hydronephrosis: Secondary | ICD-10-CM

## 2020-11-24 DIAGNOSIS — Z906 Acquired absence of other parts of urinary tract: Secondary | ICD-10-CM | POA: Diagnosis not present

## 2020-11-24 DIAGNOSIS — Z91048 Other nonmedicinal substance allergy status: Secondary | ICD-10-CM | POA: Diagnosis not present

## 2020-11-24 DIAGNOSIS — M5136 Other intervertebral disc degeneration, lumbar region: Secondary | ICD-10-CM | POA: Diagnosis present

## 2020-11-24 DIAGNOSIS — J309 Allergic rhinitis, unspecified: Secondary | ICD-10-CM | POA: Diagnosis present

## 2020-11-24 DIAGNOSIS — H40001 Preglaucoma, unspecified, right eye: Secondary | ICD-10-CM | POA: Diagnosis present

## 2020-11-24 DIAGNOSIS — F32A Depression, unspecified: Secondary | ICD-10-CM | POA: Diagnosis present

## 2020-11-24 DIAGNOSIS — Z87440 Personal history of urinary (tract) infections: Secondary | ICD-10-CM | POA: Diagnosis not present

## 2020-11-24 DIAGNOSIS — Z888 Allergy status to other drugs, medicaments and biological substances status: Secondary | ICD-10-CM | POA: Diagnosis not present

## 2020-11-24 DIAGNOSIS — Z91013 Allergy to seafood: Secondary | ICD-10-CM | POA: Diagnosis not present

## 2020-11-24 DIAGNOSIS — K219 Gastro-esophageal reflux disease without esophagitis: Secondary | ICD-10-CM | POA: Diagnosis present

## 2020-11-24 DIAGNOSIS — Z85828 Personal history of other malignant neoplasm of skin: Secondary | ICD-10-CM | POA: Diagnosis not present

## 2020-11-24 DIAGNOSIS — Z8616 Personal history of COVID-19: Secondary | ICD-10-CM | POA: Diagnosis not present

## 2020-11-24 DIAGNOSIS — K5730 Diverticulosis of large intestine without perforation or abscess without bleeding: Secondary | ICD-10-CM | POA: Diagnosis not present

## 2020-11-24 DIAGNOSIS — Z974 Presence of external hearing-aid: Secondary | ICD-10-CM | POA: Diagnosis not present

## 2020-11-24 DIAGNOSIS — Z8619 Personal history of other infectious and parasitic diseases: Secondary | ICD-10-CM | POA: Diagnosis not present

## 2020-11-24 DIAGNOSIS — Z91041 Radiographic dye allergy status: Secondary | ICD-10-CM | POA: Diagnosis not present

## 2020-11-24 DIAGNOSIS — N136 Pyonephrosis: Secondary | ICD-10-CM | POA: Diagnosis present

## 2020-11-24 MED ORDER — ALBUTEROL SULFATE (2.5 MG/3ML) 0.083% IN NEBU: 3.0000 mL | INHALATION_SOLUTION | Freq: Four times a day (QID) | RESPIRATORY_TRACT | Status: DC | PRN

## 2020-11-24 MED ORDER — SODIUM CHLORIDE 0.9 % IV SOLN
1.0000 g | Freq: Every day | INTRAVENOUS | Status: DC
  Administered 2020-11-24 – 2020-11-25 (×2): 1000 mg via INTRAVENOUS
  Filled 2020-11-24 (×3): qty 1

## 2020-11-24 MED ORDER — ACETAMINOPHEN 500 MG PO TABS
1000.0000 mg | ORAL_TABLET | Freq: Once | ORAL | Status: AC
  Administered 2020-11-24: 1000 mg via ORAL
  Filled 2020-11-24: qty 2

## 2020-11-24 MED ORDER — ACETAMINOPHEN 325 MG PO TABS
650.0000 mg | ORAL_TABLET | Freq: Four times a day (QID) | ORAL | Status: DC | PRN
  Administered 2020-11-24 – 2020-11-25 (×4): 650 mg via ORAL
  Filled 2020-11-24 (×5): qty 2

## 2020-11-24 MED ORDER — MONTELUKAST SODIUM 10 MG PO TABS
10.0000 mg | ORAL_TABLET | Freq: Every day | ORAL | Status: DC
  Administered 2020-11-24 – 2020-11-25 (×2): 10 mg via ORAL
  Filled 2020-11-24 (×2): qty 1

## 2020-11-24 MED ORDER — VENLAFAXINE HCL 75 MG PO TABS
112.5000 mg | ORAL_TABLET | Freq: Every day | ORAL | Status: DC
  Administered 2020-11-24: 37.5 mg via ORAL
  Administered 2020-11-25 – 2020-11-26 (×2): 112.5 mg via ORAL
  Filled 2020-11-24 (×3): qty 1

## 2020-11-24 MED ORDER — ACETAMINOPHEN 650 MG RE SUPP: 650.0000 mg | Freq: Four times a day (QID) | RECTAL | Status: DC | PRN

## 2020-11-24 MED ORDER — LATANOPROST 0.005 % OP SOLN
1.0000 [drp] | Freq: Every day | OPHTHALMIC | Status: DC
  Administered 2020-11-24 – 2020-11-25 (×2): 1 [drp] via OPHTHALMIC
  Filled 2020-11-24: qty 2.5

## 2020-11-24 MED ORDER — TRIAMCINOLONE ACETONIDE 55 MCG/ACT NA AERO
1.0000 | INHALATION_SPRAY | Freq: Every day | NASAL | Status: DC
  Administered 2020-11-24 – 2020-11-25 (×2): 1 via NASAL
  Filled 2020-11-24: qty 10.8

## 2020-11-24 MED ORDER — ONDANSETRON HCL 4 MG PO TABS: 4.0000 mg | ORAL_TABLET | Freq: Four times a day (QID) | ORAL | Status: DC | PRN

## 2020-11-24 MED ORDER — ONDANSETRON HCL 4 MG/2ML IJ SOLN: 4.0000 mg | Freq: Four times a day (QID) | INTRAMUSCULAR | Status: DC | PRN

## 2020-11-24 MED ORDER — MOMETASONE FURO-FORMOTEROL FUM 200-5 MCG/ACT IN AERO
2.0000 | INHALATION_SPRAY | Freq: Two times a day (BID) | RESPIRATORY_TRACT | Status: DC
  Administered 2020-11-24 – 2020-11-26 (×4): 2 via RESPIRATORY_TRACT
  Filled 2020-11-24: qty 8.8

## 2020-11-24 NOTE — Consult Note (Signed)
I have been asked to see the patient by Dr. Charlann Lange, for evaluation and management of pyelonephritis and associated hydroureteronephrosis.Marland Kitchen  History of present illness: 80 year old male with a history of muscle invasive bladder cancer who underwent robotic assisted cystoprostatectomy with ileal conduit in 2018 here in [facility] by Dr. Tresa Moore.  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  He subsequently went to [facility] where he had revision of his ureteral intestinal anastomosis was and as been doing very well over the following 4 years.  However, over the last 3 weeks he developed some fatigue, malaise, and not feeling well.  He developed fever over the last several days.  He was seen in the urgent care center and a CT scan was performed.  This demonstrated progressive right-sided hydronephrosis when compared to 2018.  The left hydro was improved when compared to the most recent scans in [place].  He does have mild left-sided cortical atrophy.  The patient's renal function was noted to be quite normal.  At the time of our consult the patient had been treated with broad-spectrum antibiotics and the urine cultures were pending.  He has no septoid physiology.    Review of systems: A 12 point comprehensive review of systems was obtained and is negative unless otherwise stated in the history of present illness.  Patient Active Problem List   Diagnosis Date Noted   UTI (urinary tract infection) 11/24/2020   Sepsis ([place]) 11/24/2020   History of ESBL E. coli infection 11/24/2020   Hydronephrosis 11/24/2020   Cough 09/06/2020   Low back pain 08/03/2020   Acute bronchitis 07/03/2020   Bilateral leg edema 02/16/2020   Chronic pain of both shoulders 08/04/2019   COVID-19 02/28/2019   Dizziness 06/21/2018   Gait disorder 01/24/2018   Left hip pain 11/11/2017   Abdominal wall hernia 05/26/2017   Recurrent urinary tract infection  11/13/2016   Chronic diastolic CHF (congestive heart failure) ([place]) 11/12/2016   Fatigue 08/19/2016   History of bladder cancer 06/18/2016   History of prostate cancer 06/18/2016   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[place]) 06/12/2016   Carotid bruit 02/17/2016   Prediabetes 08/20/2015   Overweight (BMI 25.0-29.9) 08/20/2015   Glaucoma 02/14/2015   Varicose veins of lower extremities with other complications 24/26/8341   RAD (reactive airway disease) 10/27/2011   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   Current Outpatient Medications on File Prior to Encounter  Medication Sig Dispense Refill   acetaminophen (TYLENOL) 500 MG tablet Take 500 mg by mouth every 6 (six) hours as needed.     ADVAIR DISKUS 250-50 MCG/DOSE AEPB INHALE 1 PUFF INTO THE LUNGS EVERY 12 HOURS (Patient taking differently: Inhale 1 puff into the lungs in the morning and at bedtime.) 60 each 5   albuterol (VENTOLIN HFA) 108 (90 Base) MCG/ACT inhaler Inhale 2 puffs into the lungs every 6 (six) hours as needed for wheezing or shortness of breath. 1 each 8   latanoprost (XALATAN) 0.005 % ophthalmic solution Place 1 drop into the right eye at bedtime.     magnesium oxide (MAG-OX) 400 MG tablet Take 400 mg by mouth daily.  Misc Natural Products (GLUCOSAMINE CHOND DOUBLE STR PO) Take 1 tablet by mouth 2 (two) times daily.     montelukast (SINGULAIR) 10 MG tablet TAKE ONE TABLET BY MOUTH DAILY (Patient taking differently: Take 10 mg by mouth at bedtime.) 90 tablet 0   triamcinolone (NASACORT) 55 MCG/ACT AERO nasal inhaler Place 1 spray into the nose at bedtime.     venlafaxine  (EFFEXOR) 75 MG tablet Take 1.5 tablets (112.5 mg total) by mouth daily. 135 tablet 1    Past Medical History:  Diagnosis Date   Allergic rhinitis    Arthritis    Benign localized prostatic hyperplasia with lower urinary tract symptoms (LUTS)    Bladder tumor    Borderline glaucoma of right eye    Cancer ([place])    bladder and prostate   Carotid bruit    per duplex 03-11-2016 RICA 1-39%   Chronic throat clearing    COVID-19    DDD (degenerative disc disease), lumbar    Depression    ED (erectile dysfunction)    Elevated PSA    urologist-  dr [name]--- s/p  prostate bx's   GERD (gastroesophageal reflux disease)    Hematuria    History of chronic bronchitis    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    s/p  MOH's nasal --  pre-melanoma   History of squamous cell carcinoma excision    several times   Hyperlipidemia    Pre-diabetes    Premature ventricular contractions (PVCs) (VPCs)    RAD (reactive airway disease)    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  s/p  deep neck lymph node bx's and lung bx's   Sensorineural hearing loss (SNHL) of both ears    Sepsis ([place]) 09/2016   UTI (urinary tract infection) 08/2016   Wears glasses    Wears hearing aid    bilateral    Past Surgical History:  Procedure Laterality Date   APPENDECTOMY  2008   CARDIOVASCULAR STRESS TEST  05/27/2010   normal nuclear study w/ no ischemia/  normal LV function and wall motion , ef 60%   CYSTOSCOPY WITH INJECTION N/A 06/12/2016   Procedure: CYSTOSCOPY WITH INJECTION OF INDOCYANINE GREEN DYE;  Surgeon: Alexis Frock, MD;  Location: [facility];  Service: Urology;  Laterality: N/A;   DEEP NECK LYMPH NODE BIOPSY / EXCISION  1970's   and Bronchoscopy w/ bx's ( dx Sarcoidosis)   ILEOSTOMY     IR NEPHROSTOMY PLACEMENT LEFT  11/18/2016   MOHS SURGERY  2013 approx.    nasal; pre melanoma   PARS PLANA VITRECTOMY Right 2007   repair macular pucker   TONSILLECTOMY AND ADENOIDECTOMY   child   TRANSURETHRAL RESECTION OF BLADDER TUMOR N/A 04/06/2016   Procedure: CYSTOSCOPY TRANSURETHRAL RESECTION OF BLADDER TUMORS (TURBT);  Surgeon: Carolan Clines, MD;  Location: [facility];  Service: Urology;  Laterality: N/A;   TRANSURETHRAL RESECTION OF PROSTATE      Social History   Tobacco Use   Smoking status: Never   Smokeless tobacco: Never  Vaping Use   Vaping Use: Never used  Substance Use Topics   Alcohol use: No   Drug use: No    Family History  Problem Relation Age of Onset   Stroke Mother        in her 35s   Breast cancer Sister    Heart disease Maternal Grandmother 74       MI    Breast cancer  Paternal Grandmother    Heart disease Paternal Grandfather 65       MI   Stroke Brother    Healthy Father    Diabetes Neg Hx     PE: Vitals:   11/24/20 0500 11/24/20 0630 11/24/20 0730 11/24/20 0750  BP: 133/77 116/82 117/74   Pulse: (!) 102 94 85   Resp: 18 (!) 25 19   Temp: (!) 100.8 F (38.2 C)   98.6 F (37 C)  TempSrc: Oral   Oral  SpO2: 95% 94% 94%   Weight:      Height:       Patient appears to be in no acute distress  patient is alert and oriented x3 Atraumatic normocephalic head No cervical or supraclavicular lymphadenopathy appreciated No increased work of breathing, no audible wheezes/rhonchi Regular sinus rhythm/rate Abdomen is soft, nontender, nondistended, right CVA tenderness The patient's ostomy is pink and viable.  The urine is clear yellow. Lower extremities are symmetric without appreciable edema Grossly neurologically intact No identifiable skin lesions  Recent Labs    11/23/20 2259  WBC 14.9*  HGB 13.1  HCT 38.8*   Recent Labs    11/23/20 2259  NA 136  K 3.9  CL 106  CO2 25  GLUCOSE 121*  BUN 16  CREATININE 1.09  CALCIUM 8.6*   No results for input(s): LABPT, INR in the last 72 hours. No results for input(s): LABURIN in the last 72 hours. Results for orders placed or performed during the  hospital encounter of 11/23/20  Resp Panel by RT-PCR (Flu A&B, Covid) Nasopharyngeal Swab     Status: None   Collection Time: 11/23/20 11:08 PM   Specimen: Nasopharyngeal Swab; Nasopharyngeal(NP) swabs in vial transport medium  Result Value Ref Range Status   SARS Coronavirus 2 by RT PCR NEGATIVE NEGATIVE Final    Comment: (NOTE) SARS-CoV-2 target nucleic acids are NOT DETECTED.  The SARS-CoV-2 RNA is generally detectable in upper respiratory specimens during the acute phase of infection. The lowest concentration of SARS-CoV-2 viral copies this assay can detect is 138 copies/mL. A negative result does not preclude SARS-Cov-2 infection and should not be used as the sole basis for treatment or other patient management decisions. A negative result may occur with  improper specimen collection/handling, submission of specimen other than nasopharyngeal swab, presence of viral mutation(s) within the areas targeted by this assay, and inadequate number of viral copies(<138 copies/mL). A negative result must be combined with clinical observations, patient history, and epidemiological information. The expected result is Negative.  Fact Sheet for Patients:  EntrepreneurPulse.com.au  Fact Sheet for Healthcare Providers:  IncredibleEmployment.be  This test is no t yet approved or cleared by the [place] FDA and  has been authorized for detection and/or diagnosis of SARS-CoV-2 by FDA under an Emergency Use Authorization (EUA). This EUA will remain  in effect (meaning this test can be used) for the duration of the COVID-19 declaration under Section 564(b)(1) of the Act, 21 U.S.C.section 360bbb-3(b)(1), unless the authorization is terminated  or revoked sooner.       Influenza A by PCR NEGATIVE NEGATIVE Final   Influenza B by PCR NEGATIVE NEGATIVE Final    Comment: (NOTE) The Xpert Xpress SARS-CoV-2/FLU/RSV plus assay is intended as an aid in the  diagnosis of influenza from Nasopharyngeal swab specimens and should not be used as a sole basis for treatment. Nasal washings and aspirates are unacceptable for Xpert Xpress SARS-CoV-2/FLU/RSV testing.  Fact Sheet for Patients: EntrepreneurPulse.com.au  Fact  Sheet for Healthcare Providers: IncredibleEmployment.be  This test is not yet approved or cleared by the [place] and has been authorized for detection and/or diagnosis of SARS-CoV-2 by FDA under an Emergency Use Authorization (EUA). This EUA will remain in effect (meaning this test can be used) for the duration of the COVID-19 declaration under Section 564(b)(1) of the Act, 21 U.S.C. section 360bbb-3(b)(1), unless the authorization is terminated or revoked.  Performed at [facility], [address] [place], [name] [address]     Imaging: I dependently reviewed the patient's CT scan discussed with the patient and his wife.  The findings are as noted in the HPI.  Briefly he has bilateral hydronephrosis with what appears to be progression of his right-sided hydronephrosis compared to 2018.  Imp: The patient has pyelonephritis, with a history of ESBL UTIs.  CT scan demonstrated bilateral hydronephrosis, although given his anatomy, this may be not far off from his baseline.  We do not have anything to compare it to, his most recent CT scan was 2018.  He does have CT scans in the [facility] system, but I do not have access to these.  He does appear to be improving on antibiotics alone.  Recommendations: The patient, his wife and I had a long discussion about treatment options.  I told him I do not have anything to compare his CT scan to, but I felt like that the right collecting system may be more dilated today than his most recent scan, just based on the images themselves and his presentation.  However, he is getting better and he has normal renal function.  As such, we have opted to  forego nephrostomy tube placement today and continue with conservative management.  I encouraged the patient to get a copy of the CT scan on a CD and follow-up with Dr. Dimas Millin at [place] upon discharge for further discussion and management.  I told him that he would need a repeat ultrasound of his kidneys in the near future after treatment to ensure that with treatment of infection his hydro improved.   Thank you for involving me in this patient's care, please page with any further questions or concerns. Ardis Hughs

## 2020-11-24 NOTE — ED Notes (Signed)
Pt updated on status of [facility] being enroute. Urostomy bag emptied for transport. Pt ao, denies any needs at this time.

## 2020-11-24 NOTE — H&P (Signed)
History and Physical    Adrian Rios JJO:[id number] DOB: [birth year] DOA: 11/23/2020  PCP: [name], MD Patient coming from: home  Chief Complaint: Fevers and chills  HPI: Adrian Rios is a 80 y.o. male with medical history significant of bladder cancer, s/p cystectomy with ileal conduit in April 2018 followed by Dr. 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  Patient reports that he started to have fevers and chills about 2 days ago with T-max 101.35F.  Associate symptoms included right flank pain.  He has a chronic cough from sarcoidosis which is at his baseline.  Denies shortness of breath, chest pain, nausea, vomiting, diarrhea, abdominal pain.  Patient went to outside ED last night.  T-max was 100.81F, P102, RR 18, BP 117/77 with room air O2 sats 96%.  The labs showed WBC 14.9, normal lactic acid 0.8, nonrevealing CMP, UA showing pyuria.  Negative flu, negative influenza.  Chest x-ray was negative.  CT abdomen showed Mild-to-moderate bilateral hydronephrosis present, stable on the left and progressive on the right.  Patient was given meropenem and then transferred to [facility] long for further management.   Review of Systems: As per HPI otherwise 10 point review of systems negative.  Review of Systems Otherwise negative except as per HPI, including: General: Denies night sweats or unintended weight loss.Marland Kitchen  Positive for fevers and chills Resp: Denies cough, wheezing, shortness of breath. Cardiac: Denies chest pain, palpitations, orthopnea, paroxysmal nocturnal dyspnea. GI: Denies abdominal pain, nausea, vomiting, diarrhea or constipation GU: Denies dysuria, frequency, hesitancy or incontinence MS: Denies muscle aches, joint pain or swelling.  Positive for right flank pain. Neuro: Denies headache, neurologic deficits (focal weakness, numbness, tingling), abnormal gait Psych: Denies anxiety,  depression, SI/HI/AVH Skin: Denies new rashes or lesions ID: Denies sick contacts, exotic exposures, travel  Past Medical History:  Diagnosis Date   Allergic rhinitis    Arthritis    Benign localized prostatic hyperplasia with lower urinary tract symptoms (LUTS)    Bladder tumor    Borderline glaucoma of right eye    Cancer ([place])    bladder and prostate   Carotid bruit    per duplex 03-11-2016 RICA 1-39%   Chronic throat clearing    COVID-19    DDD (degenerative disc disease), lumbar    Depression    ED (erectile dysfunction)    Elevated PSA    urologist-  dr [name]--- s/p  prostate bx's   GERD (gastroesophageal reflux disease)    Hematuria    History of chronic bronchitis    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    s/p  MOH's nasal --  pre-melanoma   History of squamous cell carcinoma excision    several times   Hyperlipidemia    Pre-diabetes    Premature ventricular contractions (PVCs) (VPCs)    RAD (reactive airway disease)    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[place])    dx 1970's  s/p  deep neck lymph node bx's and lung bx's   Sensorineural hearing loss (SNHL) of both ears    Sepsis ([place]) 09/2016   UTI (urinary tract infection) 08/2016   Wears glasses    Wears hearing aid    bilateral    Past Surgical History:  Procedure Laterality Date   APPENDECTOMY  2008   CARDIOVASCULAR STRESS TEST  05/27/2010   normal nuclear study w/ no ischemia/  normal LV function and wall  motion , ef 60%   CYSTOSCOPY WITH INJECTION N/A 06/12/2016   Procedure: CYSTOSCOPY WITH INJECTION OF INDOCYANINE GREEN DYE;  Surgeon: Alexis Frock, MD;  Location: [facility];  Service: Urology;  Laterality: N/A;   DEEP NECK LYMPH NODE BIOPSY / EXCISION  1970's   and Bronchoscopy w/ bx's ( dx Sarcoidosis)   ILEOSTOMY     IR NEPHROSTOMY PLACEMENT LEFT  11/18/2016   MOHS SURGERY  2013 approx.    nasal; pre melanoma   PARS PLANA VITRECTOMY Right 2007   repair macular pucker   TONSILLECTOMY  AND ADENOIDECTOMY  child   TRANSURETHRAL RESECTION OF BLADDER TUMOR N/A 04/06/2016   Procedure: CYSTOSCOPY TRANSURETHRAL RESECTION OF BLADDER TUMORS (TURBT);  Surgeon: Carolan Clines, MD;  Location: [facility];  Service: Urology;  Laterality: N/A;   TRANSURETHRAL RESECTION OF PROSTATE      SOCIAL HISTORY:  reports that he has never smoked. He has never used smokeless tobacco. He reports that he does not drink alcohol and does not use drugs.  Allergies  Allergen Reactions   Ivp Dye [Iodinated Diagnostic Agents] Shortness Of Breath, Itching and Palpitations    "eyes itching,  Heart racing,  Effected breathing" 10-27-16 pt with 13 hr pre-meds for CT without any reaction-kj   Metrizamide Itching, Palpitations and Shortness Of Breath    "eyes itching,  Heart racing,  Effected breathing" "eyes itching,  Heart racing,  Effected breathing" 10-27-16 pt with 13 hr pre-meds for CT without any reaction-kj   Shellfish Allergy Hives, Shortness Of Breath and Itching    Mostly crab   Gabapentin Other (See Comments)    REACTION: dizziness and flushing   Niacin-Lovastatin Er Other (See Comments)    Did not feel good on medication   Gadolinium Hives   Adhesive [Tape] Rash    Use paper tape   Other Rash    Use paper tape    FAMILY HISTORY: Family History  Problem Relation Age of Onset   Stroke Mother        in her 30s   Breast cancer Sister    Heart disease Maternal Grandmother 55       MI    Breast cancer Paternal Grandmother    Heart disease Paternal Grandfather 93       MI   Stroke Brother    Healthy Father    Diabetes Neg Hx      Prior to Admission medications   Medication Sig Start Date End Date Taking? Authorizing Provider  acetaminophen (TYLENOL) 500 MG tablet Take 500 mg by mouth every 6 (six) hours as needed.    [provider]  ADVAIR DISKUS 250-50 MCG/DOSE AEPB INHALE 1 PUFF INTO THE LUNGS EVERY 12 HOURS 04/02/20   Burns, Claudina Lick, MD  albuterol  (VENTOLIN HFA) 108 (90 Base) MCG/ACT inhaler Inhale 2 puffs into the lungs every 6 (six) hours as needed for wheezing or shortness of breath. 02/01/20   [name], MD  latanoprost (XALATAN) 0.005 % ophthalmic solution SMARTSIG:In Eye(s) 05/06/20   [provider]  magnesium oxide (MAG-OX) 400 MG tablet Take 400 mg by mouth daily.    [provider]  Misc Natural Products (GLUCOSAMINE CHOND DOUBLE STR PO) Take 1 tablet by mouth 2 (two) times daily.    [provider]  montelukast (SINGULAIR) 10 MG tablet TAKE ONE TABLET BY MOUTH DAILY 10/22/20   [name], MD  triamcinolone (NASACORT) 55 MCG/ACT AERO nasal inhaler Place 1 spray into the nose  at bedtime.    [provider]  venlafaxine (EFFEXOR) 75 MG tablet Take 1.5 tablets (112.5 mg total) by mouth daily. 10/09/20   [name], MD    Physical Exam: Vitals:   11/24/20 0500 11/24/20 0630 11/24/20 0730 11/24/20 0750  BP: 133/77 116/82 117/74   Pulse: (!) 102 94 85   Resp: 18 (!) 25 19   Temp: (!) 100.8 F (38.2 C)   98.6 F (37 C)  TempSrc: Oral   Oral  SpO2: 95% 94% 94%   Weight:      Height:          Constitutional: NAD, calm, comfortable.  Acute ill-appearing. Eyes: PERRL, lids and conjunctivae normal ENMT: Mucous membranes are moist. Posterior pharynx clear of any exudate or lesions.Normal dentition.  Neck: normal, supple, no masses, no thyromegaly Respiratory: clear to auscultation bilaterally, no wheezing, no crackles. Normal respiratory effort. No accessory muscle use.  Cardiovascular: Regular rate and rhythm, no murmurs / rubs / gallops. No extremity edema. 2+ pedal pulses. No carotid bruits.  Abdomen: no tenderness, no masses palpated. No hepatosplenomegaly. Bowel sounds positive.  Musculoskeletal: no clubbing / cyanosis. No joint deformity upper and lower extremities. Good ROM, no contractures. Normal muscle tone. Right CVA mild tenderness  Skin: no rashes, lesions, ulcers. No  induration Neurologic: CN 2-12 grossly intact. Sensation intact, DTR normal. Strength 5/5 in all 4.  Psychiatric: Normal judgment and insight. Alert and oriented x 3. Normal mood.     Labs on Admission: I have personally reviewed following labs and imaging studies  CBC: Recent Labs  Lab 11/23/20 2259  WBC 14.9*  NEUTROABS 11.6*  HGB 13.1  HCT 38.8*  MCV 90.0  PLT 532   Basic Metabolic Panel: Recent Labs  Lab 11/23/20 2259  Montrail Mehrer 136  K 3.9  CL 106  CO2 25  GLUCOSE 121*  BUN 16  CREATININE 1.09  CALCIUM 8.6*   GFR: Estimated Creatinine Clearance: 50.5 mL/min (by C-G formula based on SCr of 1.09 mg/dL). Liver Function Tests: Recent Labs  Lab 11/23/20 2259  AST 19  ALT 16  ALKPHOS 107  BILITOT 0.7  PROT 7.5  ALBUMIN 3.4*   No results for input(s): LIPASE, AMYLASE in the last 168 hours. No results for input(s): AMMONIA in the last 168 hours. Coagulation Profile: No results for input(s): INR, PROTIME in the last 168 hours. Cardiac Enzymes: No results for input(s): CKTOTAL, CKMB, CKMBINDEX, TROPONINI in the last 168 hours. BNP (last 3 results) Recent Labs    02/16/20 1112  PROBNP 34.0   HbA1C: No results for input(s): HGBA1C in the last 72 hours. CBG: No results for input(s): GLUCAP in the last 168 hours. Lipid Profile: No results for input(s): CHOL, HDL, LDLCALC, TRIG, CHOLHDL, LDLDIRECT in the last 72 hours. Thyroid Function Tests: No results for input(s): TSH, T4TOTAL, FREET4, T3FREE, THYROIDAB in the last 72 hours. Anemia Panel: No results for input(s): VITAMINB12, FOLATE, FERRITIN, TIBC, IRON, RETICCTPCT in the last 72 hours. Urine analysis:    Component Value Date/Time   COLORURINE YELLOW 11/23/2020 2151   APPEARANCEUR CLOUDY (A) 11/23/2020 2151   LABSPEC 1.010 11/23/2020 2151   PHURINE 6.0 11/23/2020 2151   GLUCOSEU NEGATIVE 11/23/2020 2151   GLUCOSEU NEGATIVE 08/21/2016 1033   HGBUR MODERATE (A) 11/23/2020 2151   HGBUR negative 07/11/2009  0813   BILIRUBINUR NEGATIVE 11/23/2020 2151   [place] 11/23/2020 2151   PROTEINUR NEGATIVE 11/23/2020 2151   UROBILINOGEN 0.2 08/21/2016 1033   NITRITE POSITIVE (  A) 11/23/2020 2151   LEUKOCYTESUR LARGE (A) 11/23/2020 2151   Sepsis Labs: !!!!!!!!!!!!!!!!!!!!!!!!!!!!!!!!!!!!!!!!!!!! @LABRCNTIP (procalcitonin:4,lacticidven:4) ) Recent Results (from the past 240 hour(s))  Resp Panel by RT-PCR (Flu A&B, Covid) Nasopharyngeal Swab     Status: None   Collection Time: 11/23/20 11:08 PM   Specimen: Nasopharyngeal Swab; Nasopharyngeal(NP) swabs in vial transport medium  Result Value Ref Range Status   SARS Coronavirus 2 by RT PCR NEGATIVE NEGATIVE Final    Comment: (NOTE) SARS-CoV-2 target nucleic acids are NOT DETECTED.  The SARS-CoV-2 RNA is generally detectable in upper respiratory specimens during the acute phase of infection. The lowest concentration of SARS-CoV-2 viral copies this assay can detect is 138 copies/mL. A negative result does not preclude SARS-Cov-2 infection and should not be used as the sole basis for treatment or other patient management decisions. A negative result may occur with  improper specimen collection/handling, submission of specimen other than nasopharyngeal swab, presence of viral mutation(s) within the areas targeted by this assay, and inadequate number of viral copies(<138 copies/mL). A negative result must be combined with clinical observations, patient history, and epidemiological information. The expected result is Negative.  Fact Sheet for Patients:  EntrepreneurPulse.com.au  Fact Sheet for Healthcare Providers:  IncredibleEmployment.be  This test is no t yet approved or cleared by the [place] FDA and  has been authorized for detection and/or diagnosis of SARS-CoV-2 by FDA under an Emergency Use Authorization (EUA). This EUA will remain  in effect (meaning this test can be used) for the duration  of the COVID-19 declaration under Section 564(b)(1) of the Act, 21 U.S.C.section 360bbb-3(b)(1), unless the authorization is terminated  or revoked sooner.       Influenza A by PCR NEGATIVE NEGATIVE Final   Influenza B by PCR NEGATIVE NEGATIVE Final    Comment: (NOTE) The Xpert Xpress SARS-CoV-2/FLU/RSV plus assay is intended as an aid in the diagnosis of influenza from Nasopharyngeal swab specimens and should not be used as a sole basis for treatment. Nasal washings and aspirates are unacceptable for Xpert Xpress SARS-CoV-2/FLU/RSV testing.  Fact Sheet for Patients: EntrepreneurPulse.com.au  Fact Sheet for Healthcare Providers: IncredibleEmployment.be  This test is not yet approved or cleared by the [place] FDA and has been authorized for detection and/or diagnosis of SARS-CoV-2 by FDA under an Emergency Use Authorization (EUA). This EUA will remain in effect (meaning this test can be used) for the duration of the COVID-19 declaration under Section 564(b)(1) of the Act, 21 U.S.C. section 360bbb-3(b)(1), unless the authorization is terminated or revoked.  Performed at [facility], [address] [place], [name] [address]      Radiological Exams on Admission: DG Chest 2 View  Result Date: 11/23/2020 CLINICAL DATA:  Fever EXAM: CHEST - 2 VIEW COMPARISON:  09/06/2020 FINDINGS: Left shoulder replacement. No focal opacity or pleural effusion. Normal cardiomediastinal silhouette. No pneumothorax. IMPRESSION: No active cardiopulmonary disease. Electronically Signed   By: Donavan Foil M.D.   On: 11/23/2020 22:50   CT RENAL STONE STUDY  Result Date: 11/24/2020 CLINICAL DATA:  Flank pain, kidney stone suspected.  Fever, sepsis. EXAM: CT ABDOMEN AND PELVIS WITHOUT CONTRAST TECHNIQUE: Multidetector CT imaging of the abdomen and pelvis was performed following the standard protocol without IV contrast. COMPARISON:  CT abdomen pelvis  10/27/2016, CT chest 10/25/2016 FINDINGS: Lower chest: Subpleural pulmonary nodules within the lung bases bilaterally along the major fissure are unchanged from prior examination and safely considered benign. The visualized lung bases are otherwise clear. Moderate  multi-vessel coronary artery calcification. Global cardiac size within normal limits. No pericardial effusion. Hepatobiliary: No focal liver abnormality is seen. No gallstones, gallbladder wall thickening, or biliary dilatation. Pancreas: Unremarkable Spleen: Unremarkable Adrenals/Urinary Tract: The adrenal glands are unremarkable. The kidneys are normal in position. There is mild asymmetric left renal cortical atrophy, slightly progressive since prior examination. Right kidney is of normal size. Surgical changes of cystectomy and ileal conduit formation with right lower quadrant urostomy are again identified. Mild-to-moderate bilateral hydronephrosis is present, stable on the left and progressive on the right since prior examination. Simple bilateral renal cortical cysts are again identified. Interval development of multiple nonobstructing calculi within the lower pole the right kidney measuring up to 8 mm in greatest dimension. No ureteral calculi. Stomach/Bowel: Severe sigmoid diverticulosis without superimposed acute inflammatory change. Appendectomy has been performed. The stomach, small bowel, and large bowel are otherwise unremarkable. No evidence of obstruction. No free intraperitoneal gas or fluid. Vascular/Lymphatic: Mild aortoiliac atherosclerotic calcification. No aortic aneurysm. No pathologic adenopathy within the abdomen and pelvis. Reproductive: Status post prostatectomy Other: Diastasis of the rectus abdominus musculature again noted. No abdominal wall hernia. Rectum unremarkable. Musculoskeletal: Osseous structures are age-appropriate. No acute bone abnormality. No lytic or blastic bone lesion identified. IMPRESSION: Status post  cystoprostatectomy and ileal conduit formation. Mild-to-moderate bilateral hydronephrosis present, stable on the left and progressive on the right. Interval development of mild asymmetric left renal cortical atrophy. Urologic consultation may be helpful as retrograde ureteral stent placement may be helpful for further management. Mild nonobstructing right nephrolithiasis. Moderate multi-vessel coronary artery calcification. Severe sigmoid diverticulosis without superimposed acute inflammatory change. Aortic Atherosclerosis (ICD10-I70.0). Electronically Signed   By: Fidela Salisbury M.D.   On: 11/24/2020 01:13     All images have been reviewed by me personally.  EKG: Independently reviewed.   Assessment/Plan Active Problems:   Sarcoidosis ([place])   Depression   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[facility])   History of bladder cancer   History of prostate cancer   UTI (urinary tract infection)   Sepsis ([place])   History of ESBL E. coli infection   Hydronephrosis   Assessment Plan  #Sepsis 2/2 UTI # Hx of ESBL UTI # bladder cancer, s/p cystectomy with ileal conduit in April 2018 followed by Dr. 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  Code Status: Full code Family Communication: wife at bedside Consults called: Urology Admission status: inpatient  Status is: Inpatient  Remains  inpatient appropriate because:Ongoing diagnostic testing needed not appropriate for outpatient work up  Dispo: The patient is from: Home              Anticipated d/c is to: Home              Patient currently is not medically stable to  d/c.   Difficult to place patient No       Time Spent: 65 minutes.  >50% of the time was devoted to discussing the patients care, assessment, plan and disposition with other care givers along with counseling the patient about the risks and benefits of treatment.    Charlann Lange MD [facility]  If 7PM-7AM, please contact night-coverage   11/24/2020, 11:13 AM

## 2020-11-24 NOTE — ED Provider Notes (Signed)
[facility] Emergency Department Provider Note MRN:  [id number]  Arrival date & time: 11/24/20     Chief Complaint   Fever   History of Present Illness   Adrian Rios is a 79 y.o. year-old male with a history of CHF, ileal conduit presenting to the ED with chief complaint of fever.  Fever today up to 101.2, associated with shaking chills.  Some cough recently but otherwise no complaints.  No chest pain or shortness of breath, no abdominal pain, no rash.  Has a history of urinary tract infections.  Symptoms mild to moderate, no exacerbating or alleviating factors.  Also endorsing some right flank pain for the past week, mild.  Review of Systems  A complete 10 system review of systems was obtained and all systems are negative except as noted in the HPI and PMH.   Patient's Health History    Past Medical History:  Diagnosis Date   Allergic rhinitis    Arthritis    Benign localized prostatic hyperplasia with lower urinary tract symptoms (LUTS)    Bladder tumor    Borderline glaucoma of right eye    Cancer ([place])    bladder and prostate   Carotid bruit    per duplex 03-11-2016 RICA 1-39%   Chronic throat clearing    COVID-19    DDD (degenerative disc disease), lumbar    Depression    ED (erectile dysfunction)    Elevated PSA    urologist-  dr [name]--- s/p  prostate bx's   GERD (gastroesophageal reflux disease)    Hematuria    History of chronic bronchitis    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    s/p  MOH's nasal --  pre-melanoma   History of squamous cell carcinoma excision    several times   Hyperlipidemia    Pre-diabetes    Premature ventricular contractions (PVCs) (VPCs)    RAD (reactive airway disease)    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  s/p  deep neck lymph node bx's and lung bx's   Sensorineural hearing loss (SNHL) of both ears    Sepsis ([place]) 09/2016   UTI (urinary tract infection) 08/2016    Wears glasses    Wears hearing aid    bilateral    Past Surgical History:  Procedure Laterality Date   APPENDECTOMY  2008   CARDIOVASCULAR STRESS TEST  05/27/2010   normal nuclear study w/ no ischemia/  normal LV function and wall motion , ef 60%   CYSTOSCOPY WITH INJECTION N/A 06/12/2016   Procedure: CYSTOSCOPY WITH INJECTION OF INDOCYANINE GREEN DYE;  Surgeon: Alexis Frock, MD;  Location: [facility];  Service: Urology;  Laterality: N/A;   DEEP NECK LYMPH NODE BIOPSY / EXCISION  1970's   and Bronchoscopy w/ bx's ( dx Sarcoidosis)   ILEOSTOMY     IR NEPHROSTOMY PLACEMENT LEFT  11/18/2016   MOHS SURGERY  2013 approx.    nasal; pre melanoma   PARS PLANA VITRECTOMY Right 2007   repair macular pucker   TONSILLECTOMY AND ADENOIDECTOMY  child   TRANSURETHRAL RESECTION OF BLADDER TUMOR N/A 04/06/2016   Procedure: CYSTOSCOPY TRANSURETHRAL RESECTION OF BLADDER TUMORS (TURBT);  Surgeon: Carolan Clines, MD;  Location: [facility];  Service: Urology;  Laterality: N/A;   TRANSURETHRAL RESECTION OF PROSTATE      Family History  Problem Relation Age of Onset   Stroke Mother        in  her 62s   Breast cancer Sister    Heart disease Maternal Grandmother 78       MI    Breast cancer Paternal Grandmother    Heart disease Paternal Grandfather 69       MI   Stroke Brother    Healthy Father    Diabetes Neg Hx     Social History   Socioeconomic History   Marital status: Married    Spouse name: Not on file   Number of children: Not on file   Years of education: Not on file   Highest education level: Not on file  Occupational History   Not on file  Tobacco Use   Smoking status: Never   Smokeless tobacco: Never  Vaping Use   Vaping Use: Never used  Substance and Sexual Activity   Alcohol use: No   Drug use: No   Sexual activity: Not Currently  Other Topics Concern   Not on file  Social History Narrative   Not on file   Social Determinants of Health   Financial  Resource Strain: Low Risk    Difficulty of Paying Living Expenses: Not hard at all  Food Insecurity: No Food Insecurity   Worried About Charity fundraiser in the Last Year: Never true   [place] in the Last Year: Never true  Transportation Needs: No Transportation Needs   Lack of Transportation (Medical): No   Lack of Transportation (Non-Medical): No  Physical Activity: Sufficiently Active   Days of Exercise per Week: 5 days   Minutes of Exercise per Session: 30 min  Stress: No Stress Concern Present   Feeling of Stress : Not at all  Social Connections: Socially Integrated   Frequency of Communication with Friends and Family: More than three times a week   Frequency of Social Gatherings with Friends and Family: More than three times a week   Attends Religious Services: More than 4 times per year   Active Member of [employer] or Organizations: Yes   Attends Music therapist: More than 4 times per year   Marital Status: Married  Human resources officer Violence: Not At Risk   Fear of Current or Ex-Partner: No   Emotionally Abused: No   Physically Abused: No   Sexually Abused: No     Physical Exam   Vitals:   11/24/20 0000 11/24/20 0100  BP: 116/80 122/78  Pulse: 87 82  Resp: 20 16  Temp:    SpO2: 94% 93%    CONSTITUTIONAL: Well-appearing, NAD NEURO:  Alert and oriented x 3, no focal deficits EYES:  eyes equal and reactive ENT/NECK:  no LAD, no JVD CARDIO: Regular rate, well-perfused, normal S1 and S2 PULM:  CTAB no wheezing or rhonchi GI/GU:  normal bowel sounds, non-distended, non-tender MSK/SPINE:  No gross deformities, no edema SKIN:  no rash, atraumatic PSYCH:  Appropriate speech and behavior  *Additional and/or pertinent findings included in MDM below  Diagnostic and Interventional Summary    EKG Interpretation  Date/Time:    Ventricular Rate:    PR Interval:    QRS Duration:   QT Interval:    QTC Calculation:   R Axis:     Text  Interpretation:         Labs Reviewed  URINALYSIS, COMPLETE (UACMP) WITH MICROSCOPIC - Abnormal; Notable for the following components:      Result Value   APPearance CLOUDY (*)    Hgb urine dipstick MODERATE (*)  Nitrite POSITIVE (*)    Leukocytes,Ua LARGE (*)    Bacteria, UA MANY (*)    All other components within normal limits  COMPREHENSIVE METABOLIC PANEL - Abnormal; Notable for the following components:   Glucose, Bld 121 (*)    Calcium 8.6 (*)    Albumin 3.4 (*)    All other components within normal limits  CBC WITH DIFFERENTIAL/PLATELET - Abnormal; Notable for the following components:   WBC 14.9 (*)    HCT 38.8 (*)    Neutro Abs 11.6 (*)    Monocytes Absolute 1.1 (*)    All other components within normal limits  RESP PANEL BY RT-PCR (FLU A&B, COVID) ARPGX2  CULTURE, BLOOD (SINGLE)  URINE CULTURE  LACTIC ACID, PLASMA    CT RENAL STONE STUDY  Final Result    DG Chest 2 View  Final Result      Medications  meropenem (MERREM) 1 g in sodium chloride 0.9 % 100 mL IVPB (0 g Intravenous Stopped 11/24/20 0011)  sodium chloride 0.9 % bolus 1,000 mL (0 mLs Intravenous Stopped 11/24/20 0011)     Procedures  /  Critical Care Procedures  ED Course and Medical Decision Making  I have reviewed the triage vital signs, the nursing notes, and pertinent available records from the EMR.  Listed above are laboratory and imaging tests that I personally ordered, reviewed, and interpreted and then considered in my medical decision making (see below for details).  Concern for UTI, would be a complicated UTI given patient's ileal conduit.  Urinalysis is showing evidence of infection.  He has a history of ESBL according to records.  Given this and the rigors this evening, suspect patient will need admission.  Will obtain CT renal to exclude signs of stone given the flank pain.     CT scan without evidence of stone, there is some comment on progressive hydronephrosis right greater than  left compared to 2018, this is of unclear significance.  Will admit to medicine for further care.  Barth Kirks. [place] Small, [place] [email or web address]  Final Clinical Impressions(s) / ED Diagnoses     ICD-10-CM   1. History of ESBL E. coli infection  Z86.19     2. Urinary tract infection without hematuria, site unspecified  N39.0       ED Discharge Orders     None        Discharge Instructions Discussed with and Provided to Patient:   Discharge Instructions   None       Maudie Flakes, MD 11/24/20 [phone]

## 2020-11-25 DIAGNOSIS — N3000 Acute cystitis without hematuria: Secondary | ICD-10-CM

## 2020-11-25 LAB — CBC
HCT: 37.3 % — ABNORMAL LOW (ref 39.0–52.0)
Hemoglobin: 12.4 g/dL — ABNORMAL LOW (ref 13.0–17.0)
MCH: 30.2 pg (ref 26.0–34.0)
MCHC: 33.2 g/dL (ref 30.0–36.0)
MCV: 90.8 fL (ref 80.0–100.0)
Platelets: 366 10*3/uL (ref 150–400)
RBC: 4.11 MIL/uL — ABNORMAL LOW (ref 4.22–5.81)
RDW: 12.7 % (ref 11.5–15.5)
WBC: 12.3 10*3/uL — ABNORMAL HIGH (ref 4.0–10.5)
nRBC: 0 % (ref 0.0–0.2)

## 2020-11-25 LAB — BASIC METABOLIC PANEL
Anion gap: 8 (ref 5–15)
BUN: 13 mg/dL (ref 8–23)
CO2: 24 mmol/L (ref 22–32)
Calcium: 8.6 mg/dL — ABNORMAL LOW (ref 8.9–10.3)
Chloride: 103 mmol/L (ref 98–111)
Creatinine, Ser: 0.94 mg/dL (ref 0.61–1.24)
GFR, Estimated: >60 mL/min (ref >60)
Glucose, Bld: 107 mg/dL — ABNORMAL HIGH (ref 70–99)
Potassium: 3.7 mmol/L (ref 3.5–5.1)
Sodium: 135 mmol/L (ref 135–145)

## 2020-11-25 MED ORDER — ENOXAPARIN SODIUM 40 MG/0.4ML IJ SOSY
40.0000 mg | PREFILLED_SYRINGE | Freq: Every day | INTRAMUSCULAR | Status: DC
  Administered 2020-11-25 – 2020-11-26 (×2): 40 mg via SUBCUTANEOUS
  Filled 2020-11-25 (×2): qty 0.4

## 2020-11-25 NOTE — Progress Notes (Signed)
PROGRESS NOTE    Adrian Rios  TDD:[id number] DOB: [birth year] DOA: 11/23/2020 PCP: [name], MD   Chief Complain: Fevers, chills   Brief Narrative: Patient is a 80 year old male with history of bladder cancer status post cystectomy with ileal conduit following with urology at [facility], recurrent ESBL UTI, chronic cough, sarcoidosis, depression who presented with fever and chills from home.  Also reported right flank pain.  On presentation he was mildly febrile, normotensive.  Blood work showed elevated lymphocytes of 14.9, UA showed pyuria.  Chest x-ray was negative.  CT abdomen/pelvis showed mild to moderate bilateral hydronephrosis, stable on the left but progressive on the right.  Patient was started on invanz for suspicion of drug-resistant UTI, urology consulted for possible consideration of nephrostomy tube  placement.  Urology recommended conservative management.    Assessment & Plan:   Active Problems:   Sarcoidosis ([place])   Depression   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HCC)   History of bladder cancer   History of prostate cancer   UTI (urinary tract infection)   Sepsis ([place])   History of ESBL E. coli infection   Hydronephrosis   Suspected sepsis secondary to ESBL UTI: Presented with fever, right flank pain.  UA was sensitive urinary tract infection.  He has history of ESBL UTI in the past.  Started on invanz.  Urine culture, blood culture will be followed.  Currently afebrile.  Leukocytosis improving Will continue current antibiotics for today.  Bilateral hydronephrosis/progressive on the right: Known hydronephrosis bilaterally but imaging showed worsening on the right.  Urology consulted who recommended conservative management without placement of nephrostomy tube.  Kidney function is stable  History of bladder cancer: Status post cystectomy with ilial conduit.  Follows with Dr. Dimas Millin at [facility].  We recommend outpatient urology follow-up.  Chronic cough/history of sarcoidosis: Currently  stable.  Saturating fine on room air.  Not on oxygen at home.  Depression: Continue current medications that he was taking at home.,  On venlafaxine           DVT prophylaxis:Lovenox Code Status: Full Family Communication: Son at bedside Status is: Inpatient  Remains inpatient appropriate because:IV treatments appropriate due to intensity of illness or inability to take PO  Dispo: The patient is from: Home              Anticipated d/c is to: Home              Patient currently Home   Difficult to place patient No      Consultants: Urology  Procedures:None  Antimicrobials:  Anti-infectives (From admission, onward)    Start     Dose/Rate Route Frequency Ordered Stop   11/24/20 1200  ertapenem (INVANZ) 1,000 mg in sodium chloride 0.9 % 100 mL IVPB        1 g 200 mL/hr over 30 Minutes Intravenous Daily 11/24/20 1025     11/23/20 2315  meropenem (MERREM) 1 g in sodium chloride 0.9 % 100 mL IVPB  Status:  Discontinued        1 g 200 mL/hr over 30 Minutes Intravenous Every 8 hours 11/23/20 2311 11/24/20 1025       Subjective:  Patient seen and examined at the bedside this morning.  Hemodynamically stable.  Eager to go home.  Denies any complaints today.   Objective: Vitals:   11/24/20 0750 11/24/20 1355 11/24/20 2027 11/25/20 0443  BP:  127/74 111/72 114/68  Pulse:  88 91 87  Resp:  18 18 18  Temp: 98.6 F (37 C) 99.3 F (37.4 C) 98.5 F (36.9 C)   TempSrc: Oral Oral Oral   SpO2:  93% 93% 94%  Weight:      Height:        Intake/Output Summary (Last 24 hours) at 11/25/2020 0741 Last data filed at 11/25/2020 0600 Gross per 24 hour  Intake 680 ml  Output 875 ml  Net -195 ml   Filed Weights   11/23/20 2136  Weight: 74.8 kg    Examination:  General exam: Overall comfortable, not in distress HEENT: PERRL Respiratory system:  no wheezes or crackles  Cardiovascular system: S1 & S2 heard, RRR.  Gastrointestinal system: Abdomen is nondistended, soft  and nontender.  Urostomy with clear urine Central nervous system: Alert and oriented Extremities: No edema, no clubbing ,no cyanosis Skin: No rashes, no ulcers,no icterus       Data Reviewed: I have personally reviewed following labs and imaging studies  CBC: Recent Labs  Lab 11/23/20 2259 11/25/20 0357  WBC 14.9* 12.3*  NEUTROABS 11.6*  --   HGB 13.1 12.4*  HCT 38.8* 37.3*  MCV 90.0 90.8  PLT 388 607   Basic Metabolic Panel: Recent Labs  Lab 11/23/20 2259 11/25/20 0357  NA 136 135  K 3.9 3.7  CL 106 103  CO2 25 24  GLUCOSE 121* 107*  BUN 16 13  CREATININE 1.09 0.94  CALCIUM 8.6* 8.6*   GFR: Estimated Creatinine Clearance: 58.6 mL/min (by C-G formula based on SCr of 0.94 mg/dL). Liver Function Tests: Recent Labs  Lab 11/23/20 2259  AST 19  ALT 16  ALKPHOS 107  BILITOT 0.7  PROT 7.5  ALBUMIN 3.4*   No results for input(s): LIPASE, AMYLASE in the last 168 hours. No results for input(s): AMMONIA in the last 168 hours. Coagulation Profile: No results for input(s): INR, PROTIME in the last 168 hours. Cardiac Enzymes: No results for input(s): CKTOTAL, CKMB, CKMBINDEX, TROPONINI in the last 168 hours. BNP (last 3 results) Recent Labs    02/16/20 1112  PROBNP 34.0   HbA1C: No results for input(s): HGBA1C in the last 72 hours. CBG: No results for input(s): GLUCAP in the last 168 hours. Lipid Profile: No results for input(s): CHOL, HDL, LDLCALC, TRIG, CHOLHDL, LDLDIRECT in the last 72 hours. Thyroid Function Tests: No results for input(s): TSH, T4TOTAL, FREET4, T3FREE, THYROIDAB in the last 72 hours. Anemia Panel: No results for input(s): VITAMINB12, FOLATE, FERRITIN, TIBC, IRON, RETICCTPCT in the last 72 hours. Sepsis Labs: Recent Labs  Lab 11/23/20 2259  LATICACIDVEN 0.8    Recent Results (from the past 240 hour(s))  Resp Panel by RT-PCR (Flu A&B, Covid) Nasopharyngeal Swab     Status: None   Collection Time: 11/23/20 11:08 PM   Specimen:  Nasopharyngeal Swab; Nasopharyngeal(NP) swabs in vial transport medium  Result Value Ref Range Status   SARS Coronavirus 2 by RT PCR NEGATIVE NEGATIVE Final    Comment: (NOTE) SARS-CoV-2 target nucleic acids are NOT DETECTED.  The SARS-CoV-2 RNA is generally detectable in upper respiratory specimens during the acute phase of infection. The lowest concentration of SARS-CoV-2 viral copies this assay can detect is 138 copies/mL. A negative result does not preclude SARS-Cov-2 infection and should not be used as the sole basis for treatment or other patient management decisions. A negative result may occur with  improper specimen collection/handling, submission of specimen other than nasopharyngeal swab, presence of viral mutation(s) within the areas targeted by this assay, and inadequate number of  viral copies(<138 copies/mL). A negative result must be combined with clinical observations, patient history, and epidemiological information. The expected result is Negative.  Fact Sheet for Patients:  EntrepreneurPulse.com.au  Fact Sheet for Healthcare Providers:  IncredibleEmployment.be  This test is no t yet approved or cleared by the [place] FDA and  has been authorized for detection and/or diagnosis of SARS-CoV-2 by FDA under an Emergency Use Authorization (EUA). This EUA will remain  in effect (meaning this test can be used) for the duration of the COVID-19 declaration under Section 564(b)(1) of the Act, 21 U.S.C.section 360bbb-3(b)(1), unless the authorization is terminated  or revoked sooner.       Influenza A by PCR NEGATIVE NEGATIVE Final   Influenza B by PCR NEGATIVE NEGATIVE Final    Comment: (NOTE) The Xpert Xpress SARS-CoV-2/FLU/RSV plus assay is intended as an aid in the diagnosis of influenza from Nasopharyngeal swab specimens and should not be used as a sole basis for treatment. Nasal washings and aspirates are unacceptable for  Xpert Xpress SARS-CoV-2/FLU/RSV testing.  Fact Sheet for Patients: EntrepreneurPulse.com.au  Fact Sheet for Healthcare Providers: IncredibleEmployment.be  This test is not yet approved or cleared by the [place] FDA and has been authorized for detection and/or diagnosis of SARS-CoV-2 by FDA under an Emergency Use Authorization (EUA). This EUA will remain in effect (meaning this test can be used) for the duration of the COVID-19 declaration under Section 564(b)(1) of the Act, 21 U.S.C. section 360bbb-3(b)(1), unless the authorization is terminated or revoked.  Performed at [facility], [address] [place], [name] [address]          Radiology Studies: DG Chest 2 View  Result Date: 11/23/2020 CLINICAL DATA:  Fever EXAM: CHEST - 2 VIEW COMPARISON:  09/06/2020 FINDINGS: Left shoulder replacement. No focal opacity or pleural effusion. Normal cardiomediastinal silhouette. No pneumothorax. IMPRESSION: No active cardiopulmonary disease. Electronically Signed   By: Donavan Foil M.D.   On: 11/23/2020 22:50   CT RENAL STONE STUDY  Result Date: 11/24/2020 CLINICAL DATA:  Flank pain, kidney stone suspected.  Fever, sepsis. EXAM: CT ABDOMEN AND PELVIS WITHOUT CONTRAST TECHNIQUE: Multidetector CT imaging of the abdomen and pelvis was performed following the standard protocol without IV contrast. COMPARISON:  CT abdomen pelvis 10/27/2016, CT chest 10/25/2016 FINDINGS: Lower chest: Subpleural pulmonary nodules within the lung bases bilaterally along the major fissure are unchanged from prior examination and safely considered benign. The visualized lung bases are otherwise clear. Moderate multi-vessel coronary artery calcification. Global cardiac size within normal limits. No pericardial effusion. Hepatobiliary: No focal liver abnormality is seen. No gallstones, gallbladder wall thickening, or biliary dilatation. Pancreas: Unremarkable Spleen:  Unremarkable Adrenals/Urinary Tract: The adrenal glands are unremarkable. The kidneys are normal in position. There is mild asymmetric left renal cortical atrophy, slightly progressive since prior examination. Right kidney is of normal size. Surgical changes of cystectomy and ileal conduit formation with right lower quadrant urostomy are again identified. Mild-to-moderate bilateral hydronephrosis is present, stable on the left and progressive on the right since prior examination. Simple bilateral renal cortical cysts are again identified. Interval development of multiple nonobstructing calculi within the lower pole the right kidney measuring up to 8 mm in greatest dimension. No ureteral calculi. Stomach/Bowel: Severe sigmoid diverticulosis without superimposed acute inflammatory change. Appendectomy has been performed. The stomach, small bowel, and large bowel are otherwise unremarkable. No evidence of obstruction. No free intraperitoneal gas or fluid. Vascular/Lymphatic: Mild aortoiliac atherosclerotic calcification. No aortic aneurysm. No pathologic  adenopathy within the abdomen and pelvis. Reproductive: Status post prostatectomy Other: Diastasis of the rectus abdominus musculature again noted. No abdominal wall hernia. Rectum unremarkable. Musculoskeletal: Osseous structures are age-appropriate. No acute bone abnormality. No lytic or blastic bone lesion identified. IMPRESSION: Status post cystoprostatectomy and ileal conduit formation. Mild-to-moderate bilateral hydronephrosis present, stable on the left and progressive on the right. Interval development of mild asymmetric left renal cortical atrophy. Urologic consultation may be helpful as retrograde ureteral stent placement may be helpful for further management. Mild nonobstructing right nephrolithiasis. Moderate multi-vessel coronary artery calcification. Severe sigmoid diverticulosis without superimposed acute inflammatory change. Aortic Atherosclerosis  (ICD10-I70.0). Electronically Signed   By: Fidela Salisbury M.D.   On: 11/24/2020 01:13        Scheduled Meds:  latanoprost  1 drop Right Eye QHS   mometasone-formoterol  2 puff Inhalation BID   montelukast  10 mg Oral QHS   triamcinolone  1 spray Nasal QHS   venlafaxine  112.5 mg Oral Daily   Continuous Infusions:  ertapenem 1,000 mg (11/24/20 1200)     LOS: 1 day    Time spent: 35 mins,More than 50% of that time was spent in counseling and/or coordination of care.      Shelly Coss, MD [facility] P9/26/2022, 7:41 AM

## 2020-11-26 LAB — URINE CULTURE: Culture: >=100000 — AB

## 2020-11-26 LAB — CBC WITH DIFFERENTIAL/PLATELET
Abs Immature Granulocytes: 0.02 10*3/uL (ref 0.00–0.07)
Basophils Absolute: 0.1 10*3/uL (ref 0.0–0.1)
Basophils Relative: 1 %
Eosinophils Absolute: 0.3 10*3/uL (ref 0.0–0.5)
Eosinophils Relative: 4 %
HCT: 36.9 % — ABNORMAL LOW (ref 39.0–52.0)
Hemoglobin: 12.2 g/dL — ABNORMAL LOW (ref 13.0–17.0)
Immature Granulocytes: 0 %
Lymphocytes Relative: 28 %
Lymphs Abs: 2.4 10*3/uL (ref 0.7–4.0)
MCH: 30.2 pg (ref 26.0–34.0)
MCHC: 33.1 g/dL (ref 30.0–36.0)
MCV: 91.3 fL (ref 80.0–100.0)
Monocytes Absolute: 0.8 10*3/uL (ref 0.1–1.0)
Monocytes Relative: 9 %
Neutro Abs: 4.9 10*3/uL (ref 1.7–7.7)
Neutrophils Relative %: 58 %
Platelets: 328 10*3/uL (ref 150–400)
RBC: 4.04 MIL/uL — ABNORMAL LOW (ref 4.22–5.81)
RDW: 12.6 % (ref 11.5–15.5)
WBC: 8.4 10*3/uL (ref 4.0–10.5)
nRBC: 0 % (ref 0.0–0.2)

## 2020-11-26 MED ORDER — CEPHALEXIN 500 MG PO CAPS
500.0000 mg | ORAL_CAPSULE | Freq: Three times a day (TID) | ORAL | Status: DC
  Administered 2020-11-26: 500 mg via ORAL
  Filled 2020-11-26: qty 1

## 2020-11-26 MED ORDER — CEPHALEXIN 500 MG PO CAPS: 500.0000 mg | ORAL_CAPSULE | Freq: Three times a day (TID) | ORAL | 0 refills | Status: AC

## 2020-11-26 NOTE — Discharge Summary (Signed)
Physician Discharge Summary  TURRELL SEVERT MVH:[id number] DOB: [birth year] DOA: 11/23/2020  PCP: [name], MD  Admit date: 11/23/2020 Discharge date: 11/26/2020  Admitted From: Home Disposition:  Home  Discharge Condition:Stable CODE STATUS:FULL Diet recommendation:  Regular   Brief/Interim Summary:  Patient is a 80 year old male with history of bladder cancer status post cystectomy with ileal conduit following with urology at [facility], recurrent ESBL UTI, chronic cough, sarcoidosis, depression who presented with fever and chills from home.  Also reported right flank pain.  On presentation he was mildly febrile, normotensive.  Blood work showed elevated lymphocytes of 14.9, UA showed pyuria.  Chest x-ray was negative.  CT abdomen/pelvis showed mild to moderate bilateral hydronephrosis, stable on the left but progressive on the right.  Patient was started on invanz for suspicion of drug-resistant UTI, urology consulted for possible consideration of nephrostomy tube  placement.  Urology recommended conservative management.  He has clinically significantly improved.  Currently hemodynamically stable, afebrile.  Urine culture showed pansensitive Klebsiella pneumonia.  He is medically stable for discharge to home today with oral antibiotics.  Following problems were addressed during his hospitalization:  Suspected sepsis secondary to ESBL UTI: Presented with fever, right flank pain.  UA was suggestive of  urinary tract infection.  He has history of ESBL UTI in the past.  Started on invanz..  Leukocytosis resolved  He has clinically significantly improved.  Currently hemodynamically stable, afebrile.  Urine culture showed pansensitive Klebsiella pneumonia.  Antibiotics changed to Keflex.  Blood culture did not show any growth   Bilateral hydronephrosis/progressive on the right: Known hydronephrosis bilaterally but imaging showed worsening on the right.  Urology consulted who recommended conservative  management without placement of nephrostomy tube.  Kidney function is stable   History of bladder cancer: Status post cystectomy with ilial conduit.  Follows with Dr. Dimas Millin at [facility].  We recommend outpatient urology follow-up.   Chronic cough/history of sarcoidosis: Currently stable.  Saturating fine on room air.  Not on oxygen at home.   Depression: Continue current medications that he was taking at home.,  On venlafaxine       Discharge Diagnoses:  Active Problems:   Sarcoidosis ([place])   Depression   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[facility])   History of bladder cancer   History of prostate cancer   UTI (urinary tract infection)   Sepsis ([place])   History of ESBL E. coli infection   Hydronephrosis    Discharge Instructions  Discharge Instructions     Diet general   Complete by: As directed    Discharge instructions   Complete by: As directed    1)Please take prescribed medications as instructed 2)Follow up with your PCP and urologist as an outpatient.   Increase activity slowly   Complete by: As directed       Allergies as of 11/26/2020       Reactions   Ivp Dye [iodinated Diagnostic Agents] Shortness Of Breath, Itching, Palpitations   "eyes itching,  Heart racing,  Effected breathing" 10-27-16 pt with 13 hr pre-meds for CT without any reaction-kj   Metrizamide Itching, Palpitations, Shortness Of Breath   "eyes itching,  Heart racing,  Effected breathing" "eyes itching,  Heart racing,  Effected breathing" 10-27-16 pt with 13 hr pre-meds for CT without any reaction-kj   Shellfish Allergy Hives, Shortness Of Breath, Itching   Mostly crab   Gabapentin Other (See Comments)   REACTION: dizziness and flushing   Niacin-lovastatin Er Other (See Comments)   Did  not feel good on medication   Gadolinium Hives   Adhesive [tape] Rash   Use paper tape   Other Rash   Use paper tape        Medication List     TAKE these medications    acetaminophen 500 MG tablet Commonly known as:  TYLENOL Take 500 mg by mouth every 6 (six) hours as needed.   Advair Diskus 250-50 MCG/ACT Aepb Generic drug: fluticasone-salmeterol INHALE 1 PUFF INTO THE LUNGS EVERY 12 HOURS What changed: See the new instructions.   albuterol 108 (90 Base) MCG/ACT inhaler Commonly known as: VENTOLIN HFA Inhale 2 puffs into the lungs every 6 (six) hours as needed for wheezing or shortness of breath.   cephALEXin 500 MG capsule Commonly known as: KEFLEX Take 1 capsule (500 mg total) by mouth every 8 (eight) hours for 5 days.   GLUCOSAMINE CHOND DOUBLE STR PO Take 1 tablet by mouth 2 (two) times daily.   latanoprost 0.005 % ophthalmic solution Commonly known as: XALATAN Place 1 drop into the right eye at bedtime.   magnesium oxide 400 MG tablet Commonly known as: MAG-OX Take 400 mg by mouth daily.   montelukast 10 MG tablet Commonly known as: SINGULAIR TAKE ONE TABLET BY MOUTH DAILY What changed: when to take this   triamcinolone 55 MCG/ACT Aero nasal inhaler Commonly known as: NASACORT Place 1 spray into the nose at bedtime.   venlafaxine 75 MG tablet Commonly known as: EFFEXOR Take 1.5 tablets (112.5 mg total) by mouth daily.        Follow-up Information     [name], MD. Schedule an appointment as soon as possible for a visit in 1 week(s).   Specialty: Internal Medicine Contact information: [place] [address] [phone]                Allergies  Allergen Reactions   Ivp Dye [Iodinated Diagnostic Agents] Shortness Of Breath, Itching and Palpitations    "eyes itching,  Heart racing,  Effected breathing" 10-27-16 pt with 13 hr pre-meds for CT without any reaction-kj   Metrizamide Itching, Palpitations and Shortness Of Breath    "eyes itching,  Heart racing,  Effected breathing" "eyes itching,  Heart racing,  Effected breathing" 10-27-16 pt with 13 hr pre-meds for CT without any reaction-kj   Shellfish Allergy Hives, Shortness Of Breath  and Itching    Mostly crab   Gabapentin Other (See Comments)    REACTION: dizziness and flushing   Niacin-Lovastatin Er Other (See Comments)    Did not feel good on medication   Gadolinium Hives   Adhesive [Tape] Rash    Use paper tape   Other Rash    Use paper tape    Consultations: Urology   Procedures/Studies: DG Chest 2 View  Result Date: 11/23/2020 CLINICAL DATA:  Fever EXAM: CHEST - 2 VIEW COMPARISON:  09/06/2020 FINDINGS: Left shoulder replacement. No focal opacity or pleural effusion. Normal cardiomediastinal silhouette. No pneumothorax. IMPRESSION: No active cardiopulmonary disease. Electronically Signed   By: Donavan Foil M.D.   On: 11/23/2020 22:50   CT RENAL STONE STUDY  Result Date: 11/24/2020 CLINICAL DATA:  Flank pain, kidney stone suspected.  Fever, sepsis. EXAM: CT ABDOMEN AND PELVIS WITHOUT CONTRAST TECHNIQUE: Multidetector CT imaging of the abdomen and pelvis was performed following the standard protocol without IV contrast. COMPARISON:  CT abdomen pelvis 10/27/2016, CT chest 10/25/2016 FINDINGS: Lower chest: Subpleural pulmonary nodules within the lung bases bilaterally along the  major fissure are unchanged from prior examination and safely considered benign. The visualized lung bases are otherwise clear. Moderate multi-vessel coronary artery calcification. Global cardiac size within normal limits. No pericardial effusion. Hepatobiliary: No focal liver abnormality is seen. No gallstones, gallbladder wall thickening, or biliary dilatation. Pancreas: Unremarkable Spleen: Unremarkable Adrenals/Urinary Tract: The adrenal glands are unremarkable. The kidneys are normal in position. There is mild asymmetric left renal cortical atrophy, slightly progressive since prior examination. Right kidney is of normal size. Surgical changes of cystectomy and ileal conduit formation with right lower quadrant urostomy are again identified. Mild-to-moderate bilateral hydronephrosis is  present, stable on the left and progressive on the right since prior examination. Simple bilateral renal cortical cysts are again identified. Interval development of multiple nonobstructing calculi within the lower pole the right kidney measuring up to 8 mm in greatest dimension. No ureteral calculi. Stomach/Bowel: Severe sigmoid diverticulosis without superimposed acute inflammatory change. Appendectomy has been performed. The stomach, small bowel, and large bowel are otherwise unremarkable. No evidence of obstruction. No free intraperitoneal gas or fluid. Vascular/Lymphatic: Mild aortoiliac atherosclerotic calcification. No aortic aneurysm. No pathologic adenopathy within the abdomen and pelvis. Reproductive: Status post prostatectomy Other: Diastasis of the rectus abdominus musculature again noted. No abdominal wall hernia. Rectum unremarkable. Musculoskeletal: Osseous structures are age-appropriate. No acute bone abnormality. No lytic or blastic bone lesion identified. IMPRESSION: Status post cystoprostatectomy and ileal conduit formation. Mild-to-moderate bilateral hydronephrosis present, stable on the left and progressive on the right. Interval development of mild asymmetric left renal cortical atrophy. Urologic consultation may be helpful as retrograde ureteral stent placement may be helpful for further management. Mild nonobstructing right nephrolithiasis. Moderate multi-vessel coronary artery calcification. Severe sigmoid diverticulosis without superimposed acute inflammatory change. Aortic Atherosclerosis (ICD10-I70.0). Electronically Signed   By: Fidela Salisbury M.D.   On: 11/24/2020 01:13      Subjective: Patient seen and examined at the bedside this morning.  Hemodynamically stable for discharge.  Discharge Exam: Vitals:   11/26/20 0526 11/26/20 0813  BP: 107/75   Pulse: 72   Resp: 16   Temp: 98 F (36.7 C)   SpO2: 95% 95%   Vitals:   11/25/20 1405 11/25/20 1926 11/26/20 0526 11/26/20  0813  BP: 115/78  107/75   Pulse: 82  72   Resp: 18  16   Temp: 98.2 F (36.8 C)  98 F (36.7 C)   TempSrc: Oral  Oral   SpO2: 95% 96% 95% 95%  Weight:      Height:        General: Pt is alert, awake, not in acute distress Cardiovascular: RRR, S1/S2 +, no rubs, no gallops Respiratory: CTA bilaterally, no wheezing, no rhonchi Abdominal: Soft, NT, ND, bowel sounds +, urostomy Extremities: no edema, no cyanosis    The results of significant diagnostics from this hospitalization (including imaging, microbiology, ancillary and laboratory) are listed below for reference.     Microbiology: Recent Results (from the past 240 hour(s))  Urine Culture     Status: Abnormal   Collection Time: 11/23/20  9:51 PM   Specimen: Urine, Clean Catch  Result Value Ref Range Status   Specimen Description   Final    URINE, CLEAN CATCH Performed at [facility], [address] [place], [place] [address]    Special Requests   Final    NONE Performed at [facility], [address] [place], [name] [address]    Culture >=100,000 COLONIES/mL KLEBSIELLA PNEUMONIAE (A)  Final   Report Status 11/26/2020 FINAL  Final   Organism ID, Bacteria KLEBSIELLA PNEUMONIAE (A)  Final      Susceptibility   Klebsiella pneumoniae - MIC*    AMPICILLIN RESISTANT Resistant     CEFAZOLIN <=4 SENSITIVE Sensitive     CEFEPIME <=0.12 SENSITIVE Sensitive     CEFTRIAXONE <=0.25 SENSITIVE Sensitive     CIPROFLOXACIN <=0.25 SENSITIVE Sensitive     GENTAMICIN <=1 SENSITIVE Sensitive     IMIPENEM <=0.25 SENSITIVE Sensitive     NITROFURANTOIN 32 SENSITIVE Sensitive     TRIMETH/SULFA <=20 SENSITIVE Sensitive     AMPICILLIN/SULBACTAM 4 SENSITIVE Sensitive     PIP/TAZO <=4 SENSITIVE Sensitive     * >=100,000 COLONIES/mL KLEBSIELLA PNEUMONIAE  Culture, blood (single)     Status: None (Preliminary result)   Collection Time: 11/23/20 11:01 PM   Specimen: Right Antecubital; Blood  Result Value Ref Range  Status   Specimen Description   Final    RIGHT ANTECUBITAL Performed at [facility], [address] [place], [name] [address]    Special Requests   Final    BOTTLES DRAWN AEROBIC AND ANAEROBIC Blood Culture adequate volume Performed at [facility], [address] [place], [name] [address]    Culture   Final    NO GROWTH < 24 HOURS Performed at [facility] [address], [place], [place] [address]    Report Status PENDING  Incomplete  Resp Panel by RT-PCR (Flu A&B, Covid) Nasopharyngeal Swab     Status: None   Collection Time: 11/23/20 11:08 PM   Specimen: Nasopharyngeal Swab; Nasopharyngeal(NP) swabs in vial transport medium  Result Value Ref Range Status   SARS Coronavirus 2 by RT PCR NEGATIVE NEGATIVE Final    Comment: (NOTE) SARS-CoV-2 target nucleic acids are NOT DETECTED.  The SARS-CoV-2 RNA is generally detectable in upper respiratory specimens during the acute phase of infection. The lowest concentration of SARS-CoV-2 viral copies this assay can detect is 138 copies/mL. A negative result does not preclude SARS-Cov-2 infection and should not be used as the sole basis for treatment or other patient management decisions. A negative result may occur with  improper specimen collection/handling, submission of specimen other than nasopharyngeal swab, presence of viral mutation(s) within the areas targeted by this assay, and inadequate number of viral copies(<138 copies/mL). A negative result must be combined with clinical observations, patient history, and epidemiological information. The expected result is Negative.  Fact Sheet for Patients:  EntrepreneurPulse.com.au  Fact Sheet for Healthcare Providers:  IncredibleEmployment.be  This test is no t yet approved or cleared by the [place] FDA and  has been authorized for detection and/or diagnosis of SARS-CoV-2 by FDA under an Emergency Use  Authorization (EUA). This EUA will remain  in effect (meaning this test can be used) for the duration of the COVID-19 declaration under Section 564(b)(1) of the Act, 21 U.S.C.section 360bbb-3(b)(1), unless the authorization is terminated  or revoked sooner.       Influenza A by PCR NEGATIVE NEGATIVE Final   Influenza B by PCR NEGATIVE NEGATIVE Final    Comment: (NOTE) The Xpert Xpress SARS-CoV-2/FLU/RSV plus assay is intended as an aid in the diagnosis of influenza from Nasopharyngeal swab specimens and should not be used as a sole basis for treatment. Nasal washings and aspirates are unacceptable for Xpert Xpress SARS-CoV-2/FLU/RSV testing.  Fact Sheet for Patients: EntrepreneurPulse.com.au  Fact Sheet for Healthcare Providers: IncredibleEmployment.be  This test is  not yet approved or cleared by the [place] and has been authorized for detection and/or diagnosis of SARS-CoV-2 by FDA under an Emergency Use Authorization (EUA). This EUA will remain in effect (meaning this test can be used) for the duration of the COVID-19 declaration under Section 564(b)(1) of the Act, 21 U.S.C. section 360bbb-3(b)(1), unless the authorization is terminated or revoked.  Performed at [facility], [address] [place], [name] [address]      Labs: BNP (last 3 results) No results for input(s): BNP in the last 8760 hours. Basic Metabolic Panel: Recent Labs  Lab 11/23/20 2259 11/25/20 0357  NA 136 135  K 3.9 3.7  CL 106 103  CO2 25 24  GLUCOSE 121* 107*  BUN 16 13  CREATININE 1.09 0.94  CALCIUM 8.6* 8.6*   Liver Function Tests: Recent Labs  Lab 11/23/20 2259  AST 19  ALT 16  ALKPHOS 107  BILITOT 0.7  PROT 7.5  ALBUMIN 3.4*   No results for input(s): LIPASE, AMYLASE in the last 168 hours. No results for input(s): AMMONIA in the last 168 hours. CBC: Recent Labs  Lab 11/23/20 2259 11/25/20 0357 11/26/20 0447  WBC  14.9* 12.3* 8.4  NEUTROABS 11.6*  --  4.9  HGB 13.1 12.4* 12.2*  HCT 38.8* 37.3* 36.9*  MCV 90.0 90.8 91.3  PLT 388 366 328   Cardiac Enzymes: No results for input(s): CKTOTAL, CKMB, CKMBINDEX, TROPONINI in the last 168 hours. BNP: Invalid input(s): POCBNP CBG: No results for input(s): GLUCAP in the last 168 hours. D-Dimer No results for input(s): DDIMER in the last 72 hours. Hgb A1c No results for input(s): HGBA1C in the last 72 hours. Lipid Profile No results for input(s): CHOL, HDL, LDLCALC, TRIG, CHOLHDL, LDLDIRECT in the last 72 hours. Thyroid function studies No results for input(s): TSH, T4TOTAL, T3FREE, THYROIDAB in the last 72 hours.  Invalid input(s): FREET3 Anemia work up No results for input(s): VITAMINB12, FOLATE, FERRITIN, TIBC, IRON, RETICCTPCT in the last 72 hours. Urinalysis    Component Value Date/Time   COLORURINE YELLOW 11/23/2020 2151   APPEARANCEUR CLOUDY (A) 11/23/2020 2151   LABSPEC 1.010 11/23/2020 2151   PHURINE 6.0 11/23/2020 2151   GLUCOSEU NEGATIVE 11/23/2020 2151   GLUCOSEU NEGATIVE 08/21/2016 1033   HGBUR MODERATE (A) 11/23/2020 2151   HGBUR negative 07/11/2009 0813   BILIRUBINUR NEGATIVE 11/23/2020 2151   KETONESUR NEGATIVE 11/23/2020 2151   PROTEINUR NEGATIVE 11/23/2020 2151   UROBILINOGEN 0.2 08/21/2016 1033   NITRITE POSITIVE (A) 11/23/2020 2151   LEUKOCYTESUR LARGE (A) 11/23/2020 2151   Sepsis Labs Invalid input(s): PROCALCITONIN,  WBC,  LACTICIDVEN Microbiology Recent Results (from the past 240 hour(s))  Urine Culture     Status: Abnormal   Collection Time: 11/23/20  9:51 PM   Specimen: Urine, Clean Catch  Result Value Ref Range Status   Specimen Description   Final    URINE, CLEAN CATCH Performed at [facility], [address] [place], [place] [address]    Special Requests   Final    NONE Performed at [facility], [address] [place], [name] [address]    Culture >=100,000 COLONIES/mL  KLEBSIELLA PNEUMONIAE (A)  Final   Report Status 11/26/2020 FINAL  Final   Organism ID, Bacteria KLEBSIELLA PNEUMONIAE (A)  Final      Susceptibility   Klebsiella pneumoniae - MIC*    AMPICILLIN RESISTANT Resistant     CEFAZOLIN <=4 SENSITIVE Sensitive  CEFEPIME <=0.12 SENSITIVE Sensitive     CEFTRIAXONE <=0.25 SENSITIVE Sensitive     CIPROFLOXACIN <=0.25 SENSITIVE Sensitive     GENTAMICIN <=1 SENSITIVE Sensitive     IMIPENEM <=0.25 SENSITIVE Sensitive     NITROFURANTOIN 32 SENSITIVE Sensitive     TRIMETH/SULFA <=20 SENSITIVE Sensitive     AMPICILLIN/SULBACTAM 4 SENSITIVE Sensitive     PIP/TAZO <=4 SENSITIVE Sensitive     * >=100,000 COLONIES/mL KLEBSIELLA PNEUMONIAE  Culture, blood (single)     Status: None (Preliminary result)   Collection Time: 11/23/20 11:01 PM   Specimen: Right Antecubital; Blood  Result Value Ref Range Status   Specimen Description   Final    RIGHT ANTECUBITAL Performed at [facility], [address] [place], [name] [address]    Special Requests   Final    BOTTLES DRAWN AEROBIC AND ANAEROBIC Blood Culture adequate volume Performed at [facility], [address] [place], [name] [address]    Culture   Final    NO GROWTH < 24 HOURS Performed at [facility], [address] [place], [place] [address]    Report Status PENDING  Incomplete  Resp Panel by RT-PCR (Flu A&B, Covid) Nasopharyngeal Swab     Status: None   Collection Time: 11/23/20 11:08 PM   Specimen: Nasopharyngeal Swab; Nasopharyngeal(NP) swabs in vial transport medium  Result Value Ref Range Status   SARS Coronavirus 2 by RT PCR NEGATIVE NEGATIVE Final    Comment: (NOTE) SARS-CoV-2 target nucleic acids are NOT DETECTED.  The SARS-CoV-2 RNA is generally detectable in upper respiratory specimens during the acute phase of infection. The lowest concentration of SARS-CoV-2 viral copies this assay can detect is 138 copies/mL. A negative result does not preclude  SARS-Cov-2 infection and should not be used as the sole basis for treatment or other patient management decisions. A negative result may occur with  improper specimen collection/handling, submission of specimen other than nasopharyngeal swab, presence of viral mutation(s) within the areas targeted by this assay, and inadequate number of viral copies(<138 copies/mL). A negative result must be combined with clinical observations, patient history, and epidemiological information. The expected result is Negative.  Fact Sheet for Patients:  EntrepreneurPulse.com.au  Fact Sheet for Healthcare Providers:  IncredibleEmployment.be  This test is no t yet approved or cleared by the [place] FDA and  has been authorized for detection and/or diagnosis of SARS-CoV-2 by FDA under an Emergency Use Authorization (EUA). This EUA will remain  in effect (meaning this test can be used) for the duration of the COVID-19 declaration under Section 564(b)(1) of the Act, 21 U.S.C.section 360bbb-3(b)(1), unless the authorization is terminated  or revoked sooner.       Influenza A by PCR NEGATIVE NEGATIVE Final   Influenza B by PCR NEGATIVE NEGATIVE Final    Comment: (NOTE) The Xpert Xpress SARS-CoV-2/FLU/RSV plus assay is intended as an aid in the diagnosis of influenza from Nasopharyngeal swab specimens and should not be used as a sole basis for treatment. Nasal washings and aspirates are unacceptable for Xpert Xpress SARS-CoV-2/FLU/RSV testing.  Fact Sheet for Patients: EntrepreneurPulse.com.au  Fact Sheet for Healthcare Providers: IncredibleEmployment.be  This test is not yet approved or cleared by the [place] FDA and has been authorized for detection and/or diagnosis of SARS-CoV-2 by FDA under an Emergency Use Authorization (EUA). This EUA will remain in effect (meaning this test can be used) for the duration of  the COVID-19 declaration under Section  564(b)(1) of the Act, 21 U.S.C. section 360bbb-3(b)(1), unless the authorization is terminated or revoked.  Performed at [facility], [address] [place], [place] [address]     Please note: You were cared for by a hospitalist during your hospital stay. Once you are discharged, your primary care physician will handle any further medical issues. Please note that NO REFILLS for any discharge medications will be authorized once you are discharged, as it is imperative that you return to your primary care physician (or establish a relationship with a primary care physician if you do not have one) for your post hospital discharge needs so that they can reassess your need for medications and monitor your lab values.    Time coordinating discharge: 40 minutes  SIGNED:   Shelly Coss, MD  [facility] 11/26/2020, 10:19 AM Pager [phone]  If 7PM-7AM, please contact night-coverage [email or web address] Password [email or web address]

## 2020-11-28 ENCOUNTER — Telehealth: Payer: Self-pay

## 2020-11-28 NOTE — Telephone Encounter (Signed)
Transition Care Management Follow-up Telephone Call Date of discharge and from where: 11/26/2020 from [facility] How have you been since you were released from the hospital? I'm improving; Any questions or concerns? No  Items Reviewed: Did the pt receive and understand the discharge instructions provided? Yes ; Please take prescribed medications as instructed Medications obtained and verified? Yes  Other? No  Any new allergies since your discharge? No  Dietary orders reviewed? No; general diet Do you have support at home? Yes   Home Care and Equipment/Supplies: Were home health services ordered? no If so, what is the name of the agency? N/A  Has the agency set up a time to come to the patient's home? no Were any new equipment or medical supplies ordered?  No What is the name of the medical supply agency? N/A Were you able to get the supplies/equipment? no Do you have any questions related to the use of the equipment or supplies? No  Functional Questionnaire: (I = Independent and D = Dependent) ADLs: I  Bathing/Dressing- I  Meal Prep- I  Eating- I  Maintaining continence- I  Transferring/Ambulation- I  Managing Meds- I  Follow up appointments reviewed:  PCP Hospital f/u appt confirmed? Yes  Scheduled to see Adrian Gosling, MD on 10/06/ 2022@ 10:40 am. [place] [facility] f/u appt confirmed? No   Are transportation arrangements needed? No  If their condition worsens, is the pt aware to call PCP or go to the Emergency Dept.? Yes Was the patient provided with contact information for the PCP's office or ED? Yes Was to pt encouraged to call back with questions or concerns? Yes

## 2020-11-29 LAB — CULTURE, BLOOD (SINGLE)
Culture: NO GROWTH
Special Requests: ADEQUATE

## 2020-12-04 NOTE — Progress Notes (Signed)
Subjective:    Patient ID: Adrian Rios, male    DOB: 04/19/1940, 80 y.o.   MRN: [id number]  This visit occurred during the SARS-CoV-2 public health emergency.  Safety protocols were in place, including screening questions prior to the visit, additional usage of staff PPE, and extensive cleaning of exam room while observing appropriate contact time as indicated for disinfecting solutions.     HPI The patient is here for follow up from the hospital.  Admitted 9/24 - 9/27  Presented with fever/chills and right flank pain.  He was mildly febrile, WBC 14.9, UA with pyuria.  CT Ab/pelvis mld-mod b/l hydronephrosis - stable on left, but progressive on right. Cxr neg.  Started on invanz.  Urology consulted- recommended conservative management.  Clinically improved.  Urine culture showed pansensitive klebsiella pneumonia.  Stable - discharged home on abx.  Discharged home on keflex  bld cx negative. To f/u with urology at [facility].  Chronic medical problems stable.    He sees his urologist in about 2 weeks.    Appetite normal.  Walking the past couple of days.    Medications and allergies reviewed with patient and updated if appropriate.  Patient Active Problem List   Diagnosis Date Noted   UTI (urinary tract infection) 11/24/2020   Sepsis ([place]) 11/24/2020   History of ESBL E. 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/12/2016   Carotid bruit 02/17/2016   Prediabetes  08/20/2015   Glaucoma 02/14/2015   Varicose veins of lower extremities with other complications 78/58/8502   Asthma 10/27/2011   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     ADVAIR DISKUS 250-50 MCG/DOSE AEPB INHALE 1 PUFF INTO THE LUNGS EVERY 12 HOURS (Patient taking differently: Inhale 1 puff into the lungs in the morning and at bedtime.) 60 each 5   albuterol (VENTOLIN HFA) 108 (90 Base) MCG/ACT inhaler Inhale 2 puffs into the lungs every 6 (six) hours as needed for wheezing or shortness of breath. 1 each 8   latanoprost (XALATAN) 0.005 % ophthalmic solution Place 1 drop into the right eye at bedtime.     magnesium oxide (MAG-OX) 400 MG tablet Take 400 mg by mouth daily.     Misc Natural Products (GLUCOSAMINE CHOND DOUBLE STR PO) Take 1 tablet by mouth 2 (two) times daily.     montelukast (SINGULAIR) 10 MG tablet TAKE ONE TABLET BY MOUTH DAILY (Patient taking differently: Take 10 mg by mouth at bedtime.) 90 tablet 0   triamcinolone (NASACORT) 55 MCG/ACT AERO  nasal inhaler Place 1 spray into the nose at bedtime.     venlafaxine (EFFEXOR) 75 MG tablet Take 1.5 tablets (112.5 mg total) by mouth daily. 135 tablet 1   No current facility-administered medications on file prior to visit.    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    s/p  MOH's nasal --  pre-melanoma   History of squamous cell carcinoma excision    several times   Hyperlipidemia    Pre-diabetes    Premature ventricular contractions (PVCs) ([facility])    RAD (reactive airway disease)    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  s/p  deep neck lymph node bx's and lung bx's   Sensorineural hearing loss (SNHL) of both ears    Sepsis ([place]) 09/2016   UTI (urinary tract infection) 08/2016   Wears glasses    Wears hearing aid    bilateral    Past Surgical History:  Procedure Laterality Date   APPENDECTOMY  2008   CARDIOVASCULAR STRESS TEST  05/27/2010   normal nuclear study w/ no ischemia/  normal LV function and wall motion , ef 60%   CYSTOSCOPY WITH INJECTION N/A 06/12/2016   Procedure: CYSTOSCOPY WITH INJECTION OF INDOCYANINE GREEN DYE;  Surgeon: Alexis Frock, MD;  Location: [facility];  Service: Urology;  Laterality: N/A;   DEEP NECK LYMPH NODE BIOPSY / EXCISION  1970's   and Bronchoscopy w/ bx's ( dx Sarcoidosis)   ILEOSTOMY     IR NEPHROSTOMY PLACEMENT LEFT  11/18/2016   MOHS SURGERY  2013 approx.    nasal; pre melanoma   PARS PLANA VITRECTOMY Right 2007   repair macular pucker   TONSILLECTOMY AND ADENOIDECTOMY  child   TRANSURETHRAL RESECTION OF BLADDER TUMOR N/A 04/06/2016   Procedure: CYSTOSCOPY TRANSURETHRAL RESECTION OF BLADDER TUMORS (TURBT);  Surgeon: Carolan Clines, MD;  Location: [facility];  Service: Urology;  Laterality: N/A;   TRANSURETHRAL RESECTION OF PROSTATE      Social History   Socioeconomic History    Marital status: Married    Spouse name: Not on file   Number of children: Not on file   Years of education: Not on file   Highest education level: Not on file  Occupational History   Not on file  Tobacco Use   Smoking status: Never   Smokeless tobacco: Never  Vaping Use   Vaping Use: Never used  Substance and Sexual Activity   Alcohol use: No   Drug use: No   Sexual activity: Not Currently  Other Topics Concern   Not on file  Social History Narrative   Not on file   Social Determinants of Health   Financial Resource Strain: Low Risk    Difficulty of  Paying Living Expenses: Not hard at all  Food Insecurity: No Food Insecurity   Worried About [place] in the Last Year: Never true   Ran Out of Food in the Last Year: Never true  Transportation Needs: No Transportation Needs   Lack of Transportation (Medical): No   Lack of Transportation (Non-Medical): No  Physical Activity: Sufficiently Active   Days of Exercise per Week: 5 days   Minutes of Exercise per Session: 30 min  Stress: No Stress Concern Present   Feeling of Stress : Not at all  Social Connections: Socially Integrated   Frequency of Communication with Friends and Family: More than three times a week   Frequency of Social Gatherings with Friends and Family: More than three times a week   Attends Religious Services: More than 4 times per year   Active Member of [employer] or Organizations: Yes   Attends Music therapist: More than 4 times per year   Marital Status: Married    Family History  Problem Relation Age of Onset   Stroke Mother        in her 46s   Breast cancer Sister    Heart disease Maternal Grandmother 33       MI    Breast cancer Paternal Grandmother    Heart disease Paternal Grandfather 65       MI   Stroke Brother    Healthy Father    Diabetes Neg Hx     Review of Systems  Constitutional:  Positive for fatigue (ok in morning, low energy in afternoon - back to baseline).  Negative for appetite change and fever.  Respiratory:  Negative for shortness of breath.   Cardiovascular:  Negative for chest pain, palpitations and leg swelling.  Gastrointestinal:  Negative for abdominal pain, blood in stool, constipation, diarrhea and nausea.  Genitourinary:  Negative for dysuria and hematuria.  Musculoskeletal:  Positive for back pain (occ).  Neurological:  Positive for light-headedness (occ) and headaches (occ).      Objective:   Vitals:   12/05/20 1053  BP: 110/80  Pulse: 88  Temp: 98.3 F (36.8 C)  SpO2: 95%   BP Readings from Last 3 Encounters:  12/05/20 110/80  11/26/20 107/75  11/01/20 102/70   Wt Readings from Last 3 Encounters:  12/05/20 169 lb (76.7 kg)  11/23/20 165 lb (74.8 kg)  11/01/20 169 lb (76.7 kg)   Body mass index is 26.47 kg/m.   Physical Exam    Constitutional: Appears well-developed and well-nourished. No distress.  HENT:  Head: Normocephalic and atraumatic.  Neck: Neck supple. No tracheal deviation present. No thyromegaly present.  No cervical lymphadenopathy Cardiovascular: Normal rate, regular rhythm and normal heart sounds.   No murmur heard. No carotid bruit .  No edema Pulmonary/Chest: Effort normal and breath sounds normal. No respiratory distress. No has no wheezes. No rales.  Skin: Skin is warm and dry. Not diaphoretic.  Psychiatric: Normal mood and affect. Behavior is normal.   Lab Results  Component Value Date   WBC 8.4 11/26/2020   HGB 12.2 (L) 11/26/2020   HCT 36.9 (L) 11/26/2020   PLT 328 11/26/2020   GLUCOSE 107 (H) 11/25/2020   CHOL 146 08/02/2020   TRIG 130.0 08/02/2020   HDL 41.50 08/02/2020   LDLCALC 79 08/02/2020   ALT 16 11/23/2020   AST 19 11/23/2020   NA 135 11/25/2020   K 3.7 11/25/2020   CL 103 11/25/2020   CREATININE 0.94 11/25/2020  BUN 13 11/25/2020   CO2 24 11/25/2020   TSH 1.17 10/28/2018   PSA 0.29 07/11/2009   INR 0.99 10/25/2016   HGBA1C 5.9 08/02/2020   MICROALBUR 0.3  06/29/2006    CT RENAL STONE STUDY CLINICAL DATA:  Flank pain, kidney stone suspected.  Fever, sepsis.  EXAM: CT ABDOMEN AND PELVIS WITHOUT CONTRAST  TECHNIQUE: Multidetector CT imaging of the abdomen and pelvis was performed following the standard protocol without IV contrast.  COMPARISON:  CT abdomen pelvis 10/27/2016, CT chest 10/25/2016  FINDINGS: Lower chest: Subpleural pulmonary nodules within the lung bases bilaterally along the major fissure are unchanged from prior examination and safely considered benign. The visualized lung bases are otherwise clear. Moderate multi-vessel coronary artery calcification. Global cardiac size within normal limits. No pericardial effusion.  Hepatobiliary: No focal liver abnormality is seen. No gallstones, gallbladder wall thickening, or biliary dilatation.  Pancreas: Unremarkable  Spleen: Unremarkable  Adrenals/Urinary Tract: The adrenal glands are unremarkable. The kidneys are normal in position. There is mild asymmetric left renal cortical atrophy, slightly progressive since prior examination. Right kidney is of normal size. Surgical changes of cystectomy and ileal conduit formation with right lower quadrant urostomy are again identified. Mild-to-moderate bilateral hydronephrosis is present, stable on the left and progressive on the right since prior examination. Simple bilateral renal cortical cysts are again identified. Interval development of multiple nonobstructing calculi within the lower pole the right kidney measuring up to 8 mm in greatest dimension. No ureteral calculi.  Stomach/Bowel: Severe sigmoid diverticulosis without superimposed acute inflammatory change. Appendectomy has been performed. The stomach, small bowel, and large bowel are otherwise unremarkable. No evidence of obstruction. No free intraperitoneal gas or fluid.  Vascular/Lymphatic: Mild aortoiliac atherosclerotic calcification. No aortic aneurysm. No  pathologic adenopathy within the abdomen and pelvis.  Reproductive: Status post prostatectomy  Other: Diastasis of the rectus abdominus musculature again noted. No abdominal wall hernia. Rectum unremarkable.  Musculoskeletal: Osseous structures are age-appropriate. No acute bone abnormality. No lytic or blastic bone lesion identified.  IMPRESSION: Status post cystoprostatectomy and ileal conduit formation. Mild-to-moderate bilateral hydronephrosis present, stable on the left and progressive on the right. Interval development of mild asymmetric left renal cortical atrophy. Urologic consultation may be helpful as retrograde ureteral stent placement may be helpful for further management.  Mild nonobstructing right nephrolithiasis.  Moderate multi-vessel coronary artery calcification.  Severe sigmoid diverticulosis without superimposed acute inflammatory change.  Aortic Atherosclerosis (ICD10-I70.0).  Electronically Signed   By: Fidela Salisbury M.D.   On: 11/24/2020 01:13 DG Chest 2 View CLINICAL DATA:  Fever  EXAM: CHEST - 2 VIEW  COMPARISON:  09/06/2020  FINDINGS: Left shoulder replacement. No focal opacity or pleural effusion. Normal cardiomediastinal silhouette. No pneumothorax.  IMPRESSION: No active cardiopulmonary disease.  Electronically Signed   By: Donavan Foil M.D.   On: 11/23/2020 22:50    Assessment & Plan:    See Problem List for Assessment and Plan of chronic medical problems.

## 2020-12-05 ENCOUNTER — Encounter: Payer: Self-pay | Admitting: Internal Medicine

## 2020-12-05 ENCOUNTER — Other Ambulatory Visit: Payer: Self-pay

## 2020-12-05 ENCOUNTER — Ambulatory Visit (INDEPENDENT_AMBULATORY_CARE_PROVIDER_SITE_OTHER): Payer: Medicare Other | Admitting: Internal Medicine

## 2020-12-05 VITALS — BP 110/80 | HR 88 | Temp 98.3°F | Ht 67.0 in | Wt 169.0 lb

## 2020-12-05 DIAGNOSIS — J4520 Mild intermittent asthma, uncomplicated: Secondary | ICD-10-CM | POA: Diagnosis not present

## 2020-12-05 DIAGNOSIS — J309 Allergic rhinitis, unspecified: Secondary | ICD-10-CM

## 2020-12-05 DIAGNOSIS — N3000 Acute cystitis without hematuria: Secondary | ICD-10-CM | POA: Diagnosis not present

## 2020-12-05 DIAGNOSIS — F3289 Other specified depressive episodes: Secondary | ICD-10-CM | POA: Diagnosis not present

## 2020-12-05 DIAGNOSIS — D869 Sarcoidosis, unspecified: Secondary | ICD-10-CM | POA: Diagnosis not present

## 2020-12-05 MED ORDER — FLUTICASONE-SALMETEROL 250-50 MCG/ACT IN AEPB: 1.0000 | INHALATION_SPRAY | Freq: Two times a day (BID) | RESPIRATORY_TRACT | 2 refills | Status: DC

## 2020-12-05 NOTE — Assessment & Plan Note (Signed)
Acute Received IV antibiotics in the hospital for 3 days and then cephalexin as an outpatient, which he has completed No signs or symptoms of infection at this time-appetite, energy level, sleep are good No concerning urinary symptoms Has follow-up with urology in a couple of weeks Stressed that if he has any symptoms consistent with possible infection he needs his get this checked ASAP

## 2020-12-05 NOTE — Assessment & Plan Note (Signed)
Chronic Controlled Taking Advair 250-50 once daily-this is very expensive and he wonders if there is another cheaper option.  I am unable to tell if there is one or not-we will send the generic version to pharmacy to see if that is cheaper Advised him to look into his formulary to see if there are cheaper options, but they all look like they are tier 3 Continue albuterol as needed

## 2020-12-05 NOTE — Assessment & Plan Note (Signed)
Chronic Controlled, Stable Continue venlafaxine 112.5 mg daily

## 2020-12-05 NOTE — Assessment & Plan Note (Signed)
Chronic Continue Singulair daily

## 2020-12-05 NOTE — Patient Instructions (Addendum)
     Medications changes include :   generic advair  Your prescription(s) have been submitted to your pharmacy. Please take as directed and contact our office if you believe you are having problem(s) with the medication(s).

## 2020-12-18 ENCOUNTER — Encounter: Payer: Self-pay | Admitting: Internal Medicine

## 2020-12-18 DIAGNOSIS — C678 Malignant neoplasm of overlapping sites of bladder: Secondary | ICD-10-CM | POA: Diagnosis not present

## 2020-12-18 DIAGNOSIS — N1330 Unspecified hydronephrosis: Secondary | ICD-10-CM | POA: Diagnosis not present

## 2020-12-22 MED ORDER — FLUTICASONE-SALMETEROL 232-14 MCG/ACT IN AEPB: 1.0000 | INHALATION_SPRAY | Freq: Two times a day (BID) | RESPIRATORY_TRACT | 11 refills | Status: DC

## 2020-12-24 DIAGNOSIS — N179 Acute kidney failure, unspecified: Secondary | ICD-10-CM | POA: Diagnosis not present

## 2020-12-24 DIAGNOSIS — C678 Malignant neoplasm of overlapping sites of bladder: Secondary | ICD-10-CM | POA: Diagnosis not present

## 2021-01-05 ENCOUNTER — Emergency Department (HOSPITAL_BASED_OUTPATIENT_CLINIC_OR_DEPARTMENT_OTHER): Payer: Medicare Other

## 2021-01-05 ENCOUNTER — Encounter (HOSPITAL_BASED_OUTPATIENT_CLINIC_OR_DEPARTMENT_OTHER): Payer: Self-pay | Admitting: Emergency Medicine

## 2021-01-05 ENCOUNTER — Other Ambulatory Visit: Payer: Self-pay

## 2021-01-05 ENCOUNTER — Ambulatory Visit (HOSPITAL_COMMUNITY): Admit: 2021-01-05 | Discharge status: Absent without leave | Payer: Medicare Other

## 2021-01-05 ENCOUNTER — Emergency Department (HOSPITAL_BASED_OUTPATIENT_CLINIC_OR_DEPARTMENT_OTHER)
Admission: EM | Admit: 2021-01-05 | Discharge: 2021-01-05 | Disposition: A | Discharge status: Home / Self Care | Payer: Medicare Other | Attending: Emergency Medicine | Admitting: Emergency Medicine

## 2021-01-05 DIAGNOSIS — N289 Disorder of kidney and ureter, unspecified: Secondary | ICD-10-CM | POA: Diagnosis not present

## 2021-01-05 DIAGNOSIS — N3000 Acute cystitis without hematuria: Secondary | ICD-10-CM | POA: Diagnosis not present

## 2021-01-05 DIAGNOSIS — K5730 Diverticulosis of large intestine without perforation or abscess without bleeding: Secondary | ICD-10-CM | POA: Diagnosis not present

## 2021-01-05 DIAGNOSIS — J45909 Unspecified asthma, uncomplicated: Secondary | ICD-10-CM | POA: Insufficient documentation

## 2021-01-05 DIAGNOSIS — K439 Ventral hernia without obstruction or gangrene: Secondary | ICD-10-CM | POA: Diagnosis not present

## 2021-01-05 DIAGNOSIS — N281 Cyst of kidney, acquired: Secondary | ICD-10-CM | POA: Diagnosis not present

## 2021-01-05 DIAGNOSIS — N132 Hydronephrosis with renal and ureteral calculous obstruction: Secondary | ICD-10-CM | POA: Diagnosis not present

## 2021-01-05 DIAGNOSIS — R109 Unspecified abdominal pain: Secondary | ICD-10-CM | POA: Diagnosis present

## 2021-01-05 LAB — CBC WITH DIFFERENTIAL/PLATELET
Abs Immature Granulocytes: 0.06 10*3/uL (ref 0.00–0.07)
Basophils Absolute: 0.1 10*3/uL (ref 0.0–0.1)
Basophils Relative: 0 %
Eosinophils Absolute: 0.1 10*3/uL (ref 0.0–0.5)
Eosinophils Relative: 0 %
HCT: 40.9 % (ref 39.0–52.0)
Hemoglobin: 13.7 g/dL (ref 13.0–17.0)
Immature Granulocytes: 0 %
Lymphocytes Relative: 10 %
Lymphs Abs: 1.7 10*3/uL (ref 0.7–4.0)
MCH: 31.1 pg (ref 26.0–34.0)
MCHC: 33.5 g/dL (ref 30.0–36.0)
MCV: 92.7 fL (ref 80.0–100.0)
Monocytes Absolute: 1.2 10*3/uL — ABNORMAL HIGH (ref 0.1–1.0)
Monocytes Relative: 8 %
Neutro Abs: 13.3 10*3/uL — ABNORMAL HIGH (ref 1.7–7.7)
Neutrophils Relative %: 82 %
Platelets: 325 10*3/uL (ref 150–400)
RBC: 4.41 MIL/uL (ref 4.22–5.81)
RDW: 13.5 % (ref 11.5–15.5)
WBC: 16.3 10*3/uL — ABNORMAL HIGH (ref 4.0–10.5)
nRBC: 0 % (ref 0.0–0.2)

## 2021-01-05 LAB — COMPREHENSIVE METABOLIC PANEL
ALT: 19 U/L (ref 0–44)
AST: 22 U/L (ref 15–41)
Albumin: 3.4 g/dL — ABNORMAL LOW (ref 3.5–5.0)
Alkaline Phosphatase: 100 U/L (ref 38–126)
Anion gap: 9 (ref 5–15)
BUN: 19 mg/dL (ref 8–23)
CO2: 21 mmol/L — ABNORMAL LOW (ref 22–32)
Calcium: 8.7 mg/dL — ABNORMAL LOW (ref 8.9–10.3)
Chloride: 108 mmol/L (ref 98–111)
Creatinine, Ser: 1.55 mg/dL — ABNORMAL HIGH (ref 0.61–1.24)
GFR, Estimated: 45 mL/min — ABNORMAL LOW (ref >60)
Glucose, Bld: 133 mg/dL — ABNORMAL HIGH (ref 70–99)
Potassium: 4 mmol/L (ref 3.5–5.1)
Sodium: 138 mmol/L (ref 135–145)
Total Bilirubin: 0.6 mg/dL (ref 0.3–1.2)
Total Protein: 6.9 g/dL (ref 6.5–8.1)

## 2021-01-05 LAB — URINALYSIS, ROUTINE W REFLEX MICROSCOPIC
Bilirubin Urine: NEGATIVE
Glucose, UA: NEGATIVE mg/dL
Ketones, ur: NEGATIVE mg/dL
Nitrite: POSITIVE — AB
Protein, ur: NEGATIVE mg/dL
Specific Gravity, Urine: 1.015 (ref 1.005–1.030)
pH: 6.5 (ref 5.0–8.0)

## 2021-01-05 LAB — URINALYSIS, MICROSCOPIC (REFLEX)

## 2021-01-05 MED ORDER — SODIUM CHLORIDE 0.9 % IV BOLUS
500.0000 mL | Freq: Once | INTRAVENOUS | Status: AC
  Administered 2021-01-05: 500 mL via INTRAVENOUS

## 2021-01-05 MED ORDER — CEPHALEXIN 500 MG PO CAPS: 500.0000 mg | ORAL_CAPSULE | Freq: Four times a day (QID) | ORAL | 0 refills | Status: DC

## 2021-01-05 MED ORDER — ONDANSETRON HCL 4 MG/2ML IJ SOLN
4.0000 mg | Freq: Once | INTRAMUSCULAR | Status: AC
  Administered 2021-01-05: 4 mg via INTRAVENOUS
  Filled 2021-01-05: qty 2

## 2021-01-05 MED ORDER — ACETAMINOPHEN 325 MG PO TABS
650.0000 mg | ORAL_TABLET | Freq: Once | ORAL | Status: AC
  Administered 2021-01-05: 650 mg via ORAL
  Filled 2021-01-05: qty 2

## 2021-01-05 MED ORDER — SODIUM CHLORIDE 0.9 % IV SOLN
1.0000 g | Freq: Once | INTRAVENOUS | Status: AC
  Administered 2021-01-05: 1 g via INTRAVENOUS
  Filled 2021-01-05: qty 10

## 2021-01-05 MED ORDER — FENTANYL CITRATE PF 50 MCG/ML IJ SOSY
12.5000 ug | PREFILLED_SYRINGE | Freq: Once | INTRAMUSCULAR | Status: AC
  Administered 2021-01-05: 12.5 ug via INTRAVENOUS
  Filled 2021-01-05: qty 1

## 2021-01-05 MED ORDER — SODIUM CHLORIDE 0.9 % IV SOLN: INTRAVENOUS | Status: DC

## 2021-01-05 NOTE — ED Notes (Signed)
Discharge temp 102.1 discussed with Dr Rogene Houston. Verbal order for Po tylenol received. Pt cleared for discharge

## 2021-01-05 NOTE — ED Triage Notes (Signed)
Pt arrives pov, ambulatory to triage, c/o right flank pain since 0300 today. Pt endorses 1 g tylenol 1 hour pta.

## 2021-01-05 NOTE — ED Provider Notes (Signed)
[facility] EMERGENCY DEPARTMENT Provider Note   CSN: [id number] Arrival date & time: 01/05/21  1506     History Chief Complaint  Patient presents with   Flank Pain    Adrian Rios is a 80 y.o. male.  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  This was done at [facility].  It was done around 2018.  Patient still followed by [facility].  Patient was little bit over a month ago had urinary tract infection.  [facility] was stating that if this gets to be recurrent that they may need to do a little bit of a cleanout procedure.  Patient states that his urostomy bag is been filling up appropriately.  He came in because of the development of right-sided flank pain.  Has had trouble with kidney stones in the past.  His flank pain started about 3 in the morning.  No nausea or vomiting.      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    s/p  MOH's nasal --  pre-melanoma   History of squamous cell carcinoma excision    several times   Hyperlipidemia    Pre-diabetes    Premature ventricular contractions (PVCs) (VPCs)    RAD (reactive airway disease)    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[place]) 06/12/2016   Carotid bruit 02/17/2016   Prediabetes 08/20/2015   Glaucoma 02/14/2015   Varicose veins of lower extremities with other complications 63/78/5885   Asthma 10/27/2011   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[place]), 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   PREMATURE VENTRICULAR CONTRACTIONS 04/29/2010   ERECTILE DYSFUNCTION 04/23/2010   [place] DISEASE, LUMBOSACRAL SPINE 02/06/2010   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  Depression 04/12/2008   Allergic rhinitis 04/12/2008   Hyperlipidemia 10/05/2006   Sarcoidosis ([place]) 07/05/2006    Past Surgical History:  Procedure Laterality Date   APPENDECTOMY  2008   CARDIOVASCULAR STRESS TEST  05/27/2010   normal nuclear study w/ no ischemia/  normal LV function and wall motion , ef 60%   CYSTOSCOPY WITH INJECTION N/A 06/12/2016   Procedure: CYSTOSCOPY WITH INJECTION OF INDOCYANINE GREEN DYE;  Surgeon: Alexis Frock, MD;  Location: [facility];  Service: Urology;  Laterality: N/A;   DEEP NECK LYMPH NODE BIOPSY / EXCISION  1970's   and Bronchoscopy w/ bx's ( dx Sarcoidosis)   ILEOSTOMY     IR NEPHROSTOMY PLACEMENT LEFT  11/18/2016   MOHS SURGERY  2013 approx.    nasal; pre melanoma   PARS PLANA VITRECTOMY Right 2007    repair macular pucker   TONSILLECTOMY AND ADENOIDECTOMY  child   TRANSURETHRAL RESECTION OF BLADDER TUMOR N/A 04/06/2016   Procedure: CYSTOSCOPY TRANSURETHRAL RESECTION OF BLADDER TUMORS (TURBT);  Surgeon: Carolan Clines, MD;  Location: [facility];  Service: Urology;  Laterality: N/A;   TRANSURETHRAL RESECTION OF PROSTATE         Family History  Problem Relation Age of Onset   Stroke Mother        in her 29s   Breast cancer Sister    Heart disease Maternal Grandmother 64       MI    Breast cancer Paternal Grandmother    Heart disease Paternal Grandfather 87       MI   Stroke Brother    Healthy Father    Diabetes Neg Hx     Social History   Tobacco Use   Smoking status: Never   Smokeless tobacco: Never  Vaping Use   Vaping Use: Never used  Substance Use Topics   Alcohol use: No   Drug use: No    Home Medications Prior to Admission medications   Medication Sig Start Date End Date Taking? Authorizing Provider  cephALEXin (KEFLEX) 500 MG capsule Take 1 capsule (500 mg total) by mouth 4 (four) times daily. 01/05/21  Yes Fredia Sorrow, MD  acetaminophen (TYLENOL) 500 MG tablet Take 500 mg by mouth every 6 (six) hours as needed.    [provider]  albuterol (VENTOLIN HFA) 108 (90 Base) MCG/ACT inhaler Inhale 2 puffs into the lungs every 6 (six) hours as needed for wheezing or shortness of breath. 02/01/20   [name], MD  Fluticasone-Salmeterol [id number] MCG/ACT AEPB Inhale 1 puff into the lungs in the morning and at bedtime. 12/22/20   Burns, Claudina Lick, MD  latanoprost (XALATAN) 0.005 % ophthalmic solution Place 1 drop into the right eye at bedtime. 05/06/20   [provider]  magnesium oxide (MAG-OX) 400 MG tablet Take 400 mg by mouth daily.    [provider]  Misc Natural Products (GLUCOSAMINE CHOND DOUBLE STR PO) Take 1 tablet by mouth 2 (two) times daily.    [provider]  montelukast (SINGULAIR) 10 MG tablet TAKE  ONE TABLET BY MOUTH DAILY Patient taking differently: Take 10 mg by mouth at bedtime. 10/22/20   [name], MD  triamcinolone (NASACORT) 55 MCG/ACT AERO nasal inhaler Place 1 spray into the nose at bedtime.    [provider]  venlafaxine (EFFEXOR) 75 MG tablet Take 1.5 tablets (112.5 mg total) by mouth daily. 10/09/20   [name], MD    Allergies    Ivp dye [iodinated  diagnostic agents], Metrizamide, Shellfish allergy, Gabapentin, Niacin-lovastatin er, Gadolinium, Adhesive [tape], and Other  Review of Systems   Review of Systems  Constitutional:  Negative for chills and fever.  HENT:  Negative for ear pain and sore throat.   Eyes:  Negative for pain and visual disturbance.  Respiratory:  Negative for cough and shortness of breath.   Cardiovascular:  Negative for chest pain and palpitations.  Gastrointestinal:  Negative for abdominal pain and vomiting.  Genitourinary:  Positive for flank pain. Negative for dysuria and hematuria.  Musculoskeletal:  Negative for arthralgias and back pain.  Skin:  Negative for color change and rash.  Neurological:  Negative for seizures and syncope.  All other systems reviewed and are negative.  Physical Exam Updated Vital Signs BP 111/64   Pulse 89   Temp 99.1 F (37.3 C) (Oral)   Resp (!) 22   SpO2 93%   Physical Exam Vitals and nursing note reviewed.  Constitutional:      General: He is not in acute distress.    Appearance: Normal appearance. He is well-developed.  HENT:     Head: Normocephalic and atraumatic.  Eyes:     Extraocular Movements: Extraocular movements intact.     Conjunctiva/sclera: Conjunctivae normal.     Pupils: Pupils are equal, round, and reactive to light.  Cardiovascular:     Rate and Rhythm: Normal rate and regular rhythm.     Heart sounds: No murmur heard. Pulmonary:     Effort: Pulmonary effort is normal. No respiratory distress.     Breath sounds: Normal breath sounds.  Abdominal:      Palpations: Abdomen is soft.     Tenderness: There is no abdominal tenderness.     Comments: Urostomy bag with fair amount of clear appearing fluid.  Nonbloody.  The ileal conduit ileostomy looks healthy.  Musculoskeletal:     Cervical back: Normal range of motion and neck supple.  Skin:    General: Skin is warm and dry.  Neurological:     General: No focal deficit present.     Mental Status: He is alert and oriented to person, place, and time.    ED Results / Procedures / Treatments   Labs (all labs ordered are listed, but only abnormal results are displayed) Labs Reviewed  URINALYSIS, ROUTINE W REFLEX MICROSCOPIC - Abnormal; Notable for the following components:      Result Value   APPearance CLOUDY (*)    Hgb urine dipstick TRACE (*)    Nitrite POSITIVE (*)    Leukocytes,Ua LARGE (*)    All other components within normal limits  CBC WITH DIFFERENTIAL/PLATELET - Abnormal; Notable for the following components:   WBC 16.3 (*)    Neutro Abs 13.3 (*)    Monocytes Absolute 1.2 (*)    All other components within normal limits  COMPREHENSIVE METABOLIC PANEL - Abnormal; Notable for the following components:   CO2 21 (*)    Glucose, Bld 133 (*)    Creatinine, Ser 1.55 (*)    Calcium 8.7 (*)    Albumin 3.4 (*)    GFR, Estimated 45 (*)    All other components within normal limits  URINALYSIS, MICROSCOPIC (REFLEX) - Abnormal; Notable for the following components:   Bacteria, UA MANY (*)    All other components within normal limits  URINE CULTURE    EKG None  Radiology CT Renal Stone Study  Result Date: 01/05/2021 CLINICAL DATA:  Right flank pain since 3 a.m. EXAM: CT  ABDOMEN AND PELVIS WITHOUT CONTRAST TECHNIQUE: Multidetector CT imaging of the abdomen and pelvis was performed following the standard protocol without IV contrast. COMPARISON:  November 24, 2020 FINDINGS: Lower chest: Subpleural pulmonary nodules within the lung bases bilaterally in along the major fissure  unchanged from prior examinations and considered benign. No acute abnormality. Hepatobiliary: Unremarkable noncontrast appearance of the hepatic parenchyma. Gallbladder is unremarkable. No biliary ductal dilation. Pancreas: No pancreatic ductal dilation or evidence of acute inflammation. Spleen: Within normal limits. Adrenals/Urinary Tract: Bilateral adrenal glands are unremarkable. Postsurgical changes cystectomy and ileal conduit formation with right lower quadrant urostomy. Similar moderate right greater than left hydroureteronephrosis nonobstructive right renal calculi. No obstructive ureteral or bladder calculi visualized. Similar asymmetric left cortical atrophy. Simple bilateral renal cysts. Stomach/Bowel: No enteric contrast was administered. Stomach is unremarkable for degree of distension. No pathologic dilation of small or large bowel. Severe descending and sigmoid colonic diverticulosis without findings of acute diverticulitis. Appendix is surgically absent. Vascular/Lymphatic: Aorta bi-iliac atherosclerotic calcifications without abdominal aortic aneurysm. No pathologically enlarged abdominal or pelvic lymph nodes. Reproductive: Prostate surgically absent. Other: Rectal diastasis rectus similar prior. Small ventral hernia appears similar to prior containing a nonobstructed loop of small bowel Musculoskeletal: Multilevel degenerative changes spine. No acute osseous abnormality. IMPRESSION: 1. Postsurgical changes of cystoprostatectomy and ileal conduit formation with right lower quadrant urostomy. Similar moderate right greater than left hydroureteronephrosis. No obstructive ureteral or bladder calculi visualized. 2. Nonobstructive right renal calculi. 3. Severe descending and sigmoid colonic diverticulosis without findings of acute diverticulitis. 4. Aortic Atherosclerosis (ICD10-I70.0). Electronically Signed   By: Dahlia Bailiff M.D.   On: 01/05/2021 17:18    Procedures Procedures   Medications  Ordered in ED Medications  0.9 %  sodium chloride infusion ( Intravenous New Bag/Given 01/05/21 1751)  cefTRIAXone (ROCEPHIN) 1 g in sodium chloride 0.9 % 100 mL IVPB (1 g Intravenous New Bag/Given 01/05/21 1919)  fentaNYL (SUBLIMAZE) injection 12.5 mcg (12.5 mcg Intravenous Given 01/05/21 1716)  ondansetron (ZOFRAN) injection 4 mg (4 mg Intravenous Given 01/05/21 1716)  sodium chloride 0.9 % bolus 500 mL (0 mLs Intravenous Stopped 01/05/21 1919)    ED Course  I have reviewed the triage vital signs and the nursing notes.  Pertinent labs & imaging results that were available during my care of the patient were reviewed by me and considered in my medical decision making (see chart for details).    MDM Rules/Calculators/A&P                           Patient is nontoxic no acute distress.  Patient status post a cystoprostatectomy and ileal conduit formation right lower quadrant urostomy.  CT is stating that there is similar moderate right greater than left hydronephrosis but there is no obstructive ureteral or bladder calculi visualized.  There is nonobstructive right renal calculi.  There is evidence of significant diverticulosis without evidence of diverticulitis.  Urinalysis here raises some question of possible urinary tract infection.  Urine culture sent.  Patient's GFR is worse today than in the past.  CT does not give a good explanation for that.  We will treat patient with Rocephin here continue him on Keflex at home.  We will have them call their [facility] urologist tomorrow they may want to see him in follow-up.  Patient needs to have his renal function rechecked within a week either at [facility] or by primary care doctor.  Labs here are significant for creatinine 1.55 GFR  45.  BUN is normal.  Liver function tests normal.  Potassium 4.0 glucose 133.  No anion gap.  Evidence of a leukocytosis with a white blood cell count of 16.3 hemoglobin is 13.7.  Patient without a fever here.  Patient has good urine  flow here I gave him some IV fluids.  And there is good urine flow into the bag.  No evidence of any significant decreased urine output.  To explain the increased GFR.     Final Clinical Impression(s) / ED Diagnoses Final diagnoses:  Acute cystitis without hematuria  Renal insufficiency    Rx / DC Orders ED Discharge Orders          Ordered    cephALEXin (KEFLEX) 500 MG capsule  4 times daily        01/05/21 1931             Fredia Sorrow, MD 01/05/21 1934

## 2021-01-05 NOTE — ED Notes (Signed)
Patient transported to CT 

## 2021-01-05 NOTE — Discharge Instructions (Addendum)
Follow-up with your primary care doctor or [facility] for recheck of your renal function in a week.  Also call your [facility] urologist for follow-up of the urine cultures from today.  We will treat you as if it could be a urinary tract infection.  CT scan showed some bilateral hydronephrosis right greater than left but really unchanged from anything in the past.  No evidence of any obstructing stones.

## 2021-01-07 LAB — URINE CULTURE: Culture: >=100000 — AB

## 2021-01-08 ENCOUNTER — Other Ambulatory Visit: Payer: Self-pay

## 2021-01-08 ENCOUNTER — Telehealth: Payer: Self-pay | Admitting: Internal Medicine

## 2021-01-08 ENCOUNTER — Telehealth: Payer: Self-pay | Admitting: *Deleted

## 2021-01-08 MED ORDER — VENLAFAXINE HCL 75 MG PO TABS: 112.5000 mg | ORAL_TABLET | Freq: Every day | ORAL | 1 refills | Status: DC

## 2021-01-08 NOTE — Progress Notes (Signed)
ED Antimicrobial Stewardship Positive Culture Follow Up   Adrian Rios is an 80 y.o. male who presented to [facility] on 01/05/2021 with a chief complaint of  Chief Complaint  Patient presents with   Flank Pain    Recent Results (from the past 720 hour(s))  Urine Culture     Status: Abnormal   Collection Time: 01/05/21  3:54 PM   Specimen: Urine, Clean Catch  Result Value Ref Range Status   Specimen Description   Final    URINE, CLEAN CATCH Performed at [facility], [address] [place], [place] [address]    Special Requests   Final    NONE Performed at [facility], [address] [place], [name] [address]    Culture (A)  Final    >=100,000 COLONIES/mL ESCHERICHIA COLI Confirmed Extended Spectrum Beta-Lactamase Producer (ESBL).  In bloodstream infections from ESBL organisms, carbapenems are preferred over piperacillin/tazobactam. They are shown to have a lower risk of mortality.    Report Status 01/07/2021 FINAL  Final   Organism ID, Bacteria ESCHERICHIA COLI (A)  Final      Susceptibility   Escherichia coli - MIC*    AMPICILLIN >=32 RESISTANT Resistant     CEFAZOLIN >=64 RESISTANT Resistant     CEFEPIME 16 RESISTANT Resistant     CEFTRIAXONE >=64 RESISTANT Resistant     CIPROFLOXACIN >=4 RESISTANT Resistant     GENTAMICIN <=1 SENSITIVE Sensitive     IMIPENEM <=0.25 SENSITIVE Sensitive     NITROFURANTOIN <=16 SENSITIVE Sensitive     TRIMETH/SULFA >=320 RESISTANT Resistant     AMPICILLIN/SULBACTAM 16 INTERMEDIATE Intermediate     PIP/TAZO 8 SENSITIVE Sensitive     * >=100,000 COLONIES/mL ESCHERICHIA COLI    Plan: Symptom check, if doing well give Fosfomycin 3g x 1, if not doing well either return to ED for evaluation/tx or f/u with his providers at [facility]   ED Provider: Margarita Mail, PA-C   [name] 01/08/2021, 8:10 AM Clinical Pharmacist Monday - Friday phone -  [phone] Saturday - Sunday phone - [phone]

## 2021-01-08 NOTE — Telephone Encounter (Signed)
1.Medication Requested: venlafaxine (EFFEXOR) 75 MG tablet  2. Pharmacy (Name, Brenas): [id number] - [place], [name] - [address]  Phone:  [phone] Fax:  [phone]   3. On Med List: yes  4. Last Visit with PCP: 07.08.22  5. Next visit date with PCP: n/a  **Patient says he is now taking 1 1/2 tablets of this medication.. so he needs a new rx w/ the correct directions sent to pharmacy bc pharmacy is saying its too soon for him to refill w/ curent script they have on file**   Agent: Please be advised that RX refills may take up to 3 business days. We ask that you follow-up with your pharmacy.

## 2021-01-08 NOTE — Telephone Encounter (Signed)
Post ED Visit - Positive Culture Follow-up: Unsuccessful Patient Follow-up  Culture assessed and recommendations reviewed by:  []  Elenor Quinones, Pharm.D. []  Heide Guile, Pharm.D., BCPS AQ-ID []  Parks Neptune, Pharm.D., BCPS []  Alycia Rossetti, Pharm.D., BCPS []  [place], Pharm.D., BCPS, AAHIVP []  Legrand Como, Pharm.D., BCPS, AAHIVP []  Wynell Balloon, PharmD []  Vincenza Hews, PharmD, BCPS  Positive urine culture  []  Patient discharged without antimicrobial prescription and treatment is now indicated [x]  Organism is resistant to prescribed ED discharge antimicrobial []  Patient with positive blood cultures  Plan:  If doing well give Fosfomycin 3g x 1 dose.  If not either F/U with [facility] or return to ED. Margarita Mail, PA-C  Unable to contact patient after 3 attempts, letter will be sent to address on file  Ardeen Fillers 01/08/2021, 8:43 AM

## 2021-01-08 NOTE — Telephone Encounter (Signed)
Faxed in today. 

## 2021-01-10 ENCOUNTER — Emergency Department (HOSPITAL_BASED_OUTPATIENT_CLINIC_OR_DEPARTMENT_OTHER)
Admission: EM | Admit: 2021-01-10 | Discharge: 2021-01-10 | Disposition: A | Discharge status: Home / Self Care | Payer: Medicare Other | Attending: Emergency Medicine | Admitting: Emergency Medicine

## 2021-01-10 ENCOUNTER — Encounter (HOSPITAL_BASED_OUTPATIENT_CLINIC_OR_DEPARTMENT_OTHER): Payer: Self-pay | Admitting: *Deleted

## 2021-01-10 ENCOUNTER — Other Ambulatory Visit: Payer: Self-pay

## 2021-01-10 ENCOUNTER — Emergency Department (HOSPITAL_BASED_OUTPATIENT_CLINIC_OR_DEPARTMENT_OTHER): Payer: Medicare Other

## 2021-01-10 DIAGNOSIS — Z8651 Personal history of combat and operational stress reaction: Secondary | ICD-10-CM | POA: Diagnosis not present

## 2021-01-10 DIAGNOSIS — U071 COVID-19: Secondary | ICD-10-CM | POA: Diagnosis not present

## 2021-01-10 DIAGNOSIS — Z8546 Personal history of malignant neoplasm of prostate: Secondary | ICD-10-CM | POA: Insufficient documentation

## 2021-01-10 DIAGNOSIS — Z8616 Personal history of COVID-19: Secondary | ICD-10-CM | POA: Diagnosis not present

## 2021-01-10 DIAGNOSIS — J45909 Unspecified asthma, uncomplicated: Secondary | ICD-10-CM | POA: Insufficient documentation

## 2021-01-10 DIAGNOSIS — R059 Cough, unspecified: Secondary | ICD-10-CM | POA: Diagnosis not present

## 2021-01-10 DIAGNOSIS — I5032 Chronic diastolic (congestive) heart failure: Secondary | ICD-10-CM | POA: Insufficient documentation

## 2021-01-10 DIAGNOSIS — R509 Fever, unspecified: Secondary | ICD-10-CM | POA: Diagnosis not present

## 2021-01-10 DIAGNOSIS — Z7952 Long term (current) use of systemic steroids: Secondary | ICD-10-CM | POA: Insufficient documentation

## 2021-01-10 LAB — RESP PANEL BY RT-PCR (FLU A&B, COVID) ARPGX2
Influenza A by PCR: NEGATIVE
Influenza B by PCR: NEGATIVE
SARS Coronavirus 2 by RT PCR: POSITIVE — AB

## 2021-01-10 MED ORDER — NIRMATRELVIR/RITONAVIR (PAXLOVID)TABLET
3.0000 | ORAL_TABLET | Freq: Two times a day (BID) | ORAL | 0 refills | Status: AC
Start: 2021-01-10 — End: 2021-01-15

## 2021-01-10 NOTE — Discharge Instructions (Addendum)
Please return if your symptoms worsen or fail to improve.  According to the official recommendations you should hold your fluticasone-salmeterol inhaler while you are taking this antiviral medication.  If you have any questions you can follow-up with your primary care doctor.  Return for further evaluation if your symptoms worsen including worsening shortness of breath, chest pain that you cannot control at home.

## 2021-01-10 NOTE — ED Triage Notes (Addendum)
He had a positive home Covid test today. Fever. He took Tylenol for the fever an hour ago. Fatigue. Cough.

## 2021-01-10 NOTE — ED Provider Notes (Signed)
[facility] EMERGENCY DEPARTMENT Provider Note   CSN: [id number] Arrival date & time: 01/10/21  1700     History No chief complaint on file.   Adrian Rios is a 80 y.o. male with PMH significant for recent acute cystitis still on antibiotics, as well as remote history of sarcoidosis with reactive airway disease who present with new cough, malaise, shortness of breath since yesterday. Positive covid test at home. Patient denies nausea, vomiting, chest pain, diarrhea, headache at this time. Patient has had a covid infection in the past. He does endorse using albuterol, singulair, advair at home for lung issues but no increase in rescue inhaler over the last two days.   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    s/p  MOH's nasal --  pre-melanoma   History of squamous cell carcinoma excision    several times   Hyperlipidemia    Pre-diabetes    Premature ventricular contractions (PVCs) (VPCs)    RAD (reactive airway disease)    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 DISEASE, LUMBOSACRAL SPINE 02/06/2010   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   Depression 04/12/2008   Allergic rhinitis 04/12/2008   Hyperlipidemia 10/05/2006   Sarcoidosis ([place]) 07/05/2006    Past Surgical History:  Procedure Laterality Date   APPENDECTOMY  2008   CARDIOVASCULAR STRESS TEST  05/27/2010   normal nuclear study w/ no ischemia/  normal LV function and wall motion , ef 60%   CYSTOSCOPY WITH INJECTION N/A 06/12/2016   Procedure: CYSTOSCOPY WITH INJECTION OF INDOCYANINE GREEN DYE;  Surgeon: Alexis Frock, MD;  Location: [facility];  Service: Urology;  Laterality: N/A;   DEEP NECK LYMPH NODE BIOPSY / EXCISION  1970's   and Bronchoscopy w/ bx's ( dx Sarcoidosis)   ILEOSTOMY     IR NEPHROSTOMY PLACEMENT LEFT  11/18/2016   MOHS SURGERY  2013 approx.    nasal; pre melanoma   PARS PLANA VITRECTOMY Right 2007   repair macular pucker   TONSILLECTOMY AND ADENOIDECTOMY  child   TRANSURETHRAL RESECTION OF BLADDER TUMOR N/A 04/06/2016   Procedure:  CYSTOSCOPY TRANSURETHRAL RESECTION OF BLADDER TUMORS (TURBT);  Surgeon: Carolan Clines, MD;  Location: [facility];  Service: Urology;  Laterality: N/A;   TRANSURETHRAL RESECTION OF PROSTATE         Family History  Problem Relation Age of Onset   Stroke Mother        in her 88s   Breast cancer Sister    Heart disease Maternal Grandmother 65       MI    Breast cancer Paternal Grandmother    Heart disease Paternal Grandfather 29       MI   Stroke Brother    Healthy Father    Diabetes Neg Hx     Social History   Tobacco Use   Smoking status: Never   Smokeless tobacco: Never  Vaping Use   Vaping Use: Never used  Substance Use Topics   Alcohol use: No   Drug use: No    Home Medications Prior to Admission medications   Medication Sig Start Date End Date Taking? Authorizing Provider  acetaminophen (TYLENOL) 500 MG tablet Take 500 mg by mouth every 6 (six) hours as needed.   Yes [provider]  albuterol (VENTOLIN HFA) 108 (90 Base) MCG/ACT inhaler Inhale 2 puffs into the lungs every 6 (six) hours as needed for wheezing or shortness of breath. 02/01/20  Yes Burns, Claudina Lick, MD  cephALEXin (KEFLEX) 500 MG capsule Take 1 capsule (500 mg total) by mouth 4 (four) times daily. 01/05/21  Yes Fredia Sorrow, MD  Fluticasone-Salmeterol [phone] MCG/ACT AEPB Inhale 1 puff into the lungs in the morning and at bedtime. 12/22/20  Yes Burns, Claudina Lick, MD  latanoprost (XALATAN) 0.005 % ophthalmic solution Place 1 drop into the right eye at bedtime. 05/06/20  Yes [provider]  magnesium oxide (MAG-OX) 400 MG tablet Take 400 mg by mouth daily.   Yes [provider]  montelukast (SINGULAIR) 10 MG tablet TAKE ONE TABLET BY MOUTH DAILY Patient taking differently: Take 10 mg by mouth at bedtime. 10/22/20  Yes Burns, Claudina Lick, MD  nirmatrelvir/ritonavir EUA (PAXLOVID) 20 x 150 MG & 10 x 100MG  TABS Take 3 tablets by mouth 2 (two) times daily for 5 days. Patient  GFR is >60. Take nirmatrelvir (150 mg) two tablets twice daily for 5 days and ritonavir (100 mg) one tablet twice daily for 5 days. 01/10/21 01/15/21 Yes Renata Gambino H, PA-C  triamcinolone (NASACORT) 55 MCG/ACT AERO nasal inhaler Place 1 spray into the nose at bedtime.   Yes [provider]  venlafaxine (EFFEXOR) 75 MG tablet Take 1.5 tablets (112.5 mg total) by mouth daily. 01/08/21  Yes Burns, Claudina Lick, MD  Misc Natural Products (GLUCOSAMINE CHOND DOUBLE STR PO) Take 1 tablet by mouth 2 (two) times daily.    [provider]    Allergies    Ivp dye [iodinated diagnostic agents], Metrizamide, Shellfish allergy, Gabapentin, Niacin-lovastatin er, Gadolinium, Adhesive [tape], and Other  Review of Systems   Review of Systems  Respiratory:  Positive for cough and shortness of breath.   All other systems reviewed and are negative.  Physical Exam Updated Vital Signs BP 115/63 (BP Location: Right Arm)   Pulse 98   Temp 99.4 F (37.4 C) (Oral)   Resp 20   Ht 5\' 7"  (1.702 m)   Wt 76.7 kg   SpO2 96%   BMI 26.48 kg/m   Physical Exam Vitals and nursing note reviewed.  Constitutional:      General: He is not in acute distress.    Appearance: Normal appearance.  HENT:     Head: Normocephalic and atraumatic.     Nose: Congestion present.  Eyes:     General:        Right eye: No discharge.        Left eye: No discharge.  Cardiovascular:     Rate and Rhythm: Normal rate and regular rhythm.  Pulmonary:     Effort: Pulmonary effort is normal. No respiratory distress.  Musculoskeletal:        General: No deformity.  Skin:    General: Skin is warm and dry.  Neurological:     Mental Status: He is alert and oriented to person, place, and time.  Psychiatric:        Mood and Affect: Mood normal.        Behavior: Behavior normal.    ED Results / Procedures / Treatments   Labs (all labs ordered are listed, but only abnormal results are displayed) Labs Reviewed   RESP PANEL BY RT-PCR (FLU A&B, COVID) ARPGX2 - Abnormal; Notable for the following components:      Result Value   SARS Coronavirus 2 by RT PCR POSITIVE (*)    All other components within normal limits    EKG None  Radiology DG Chest Port 1 View  Result Date: 01/10/2021 CLINICAL DATA:  COVID.  Cough and fever. EXAM: PORTABLE CHEST 1 VIEW COMPARISON:  Chest x-ray 02/22/2021. FINDINGS: The heart size and mediastinal contours are within normal limits. Both lungs are clear. Is present. There are no acute fractures. Left shoulder arthroplasty IMPRESSION: No active disease. Electronically Signed   By: Ronney Asters M.D.   On: 01/10/2021 18:09    Procedures Procedures   Medications Ordered in ED Medications - No data to display  ED Course  I have reviewed the triage vital signs and the nursing notes.  Pertinent labs & imaging results that were available during my care of the patient were reviewed by me and considered in my medical decision making (see chart for details).    MDM Rules/Calculators/A&P                         I discussed this case with my attending physician who cosigned this note including patient's presenting symptoms, physical exam, and planned diagnostics and interventions. Attending physician stated agreement with plan or made changes to plan which were implemented.   Overall well appearing patient with minimal congestion, no tachycardia, no adventitious lung sounds. Positive home covid test today. Given history of sarcoidosis and other lung conditions, will obtain chest xray to further evaluate lungs.   This patient is overall well-appearing at this time, fevers controlled with Tylenol x1, normal vital signs, oxygen saturation, and unremarkable  chest x-ray. Will discharge with plan for patient to take paxlovid outpatient, with strict return precautions for worsening shortness of breath, chest pain, fever that does not respond to ibuprofen, Tylenol.  Discussed the  antiviral medication interacts with fluticasone-salmeterol inhalers that he should hold this medication while he is taking the antiviral.  Discussed I do not have strong feelings on whether or not he may get increased benefit continuing the inhaler versus taking the antiviral medication.  Do believe it is okay to travel at this time.  Patient does have adequate GFR function, no other interacting medications.  Discharged in stable condition at this time.  Final Clinical Impression(s) / ED Diagnoses Final diagnoses:  COVID    Rx / DC Orders ED Discharge Orders          Ordered    nirmatrelvir/ritonavir EUA (PAXLOVID) 20 x 150 MG & 10 x 100MG  TABS  2 times daily        01/10/21 1844             Carlisle Enke, Joesph Fillers, PA-C 01/10/21 Tennant, [place], DO 01/10/21 1924

## 2021-01-10 NOTE — ED Notes (Signed)
Patient discharged to home.  All discharge instructions reviewed.  Patient verbalized understanding via teachback method.  VS WDL.  Respirations even and unlabored.  Ambulatory out of ED.   °

## 2021-01-13 ENCOUNTER — Telehealth: Payer: Self-pay | Admitting: Emergency Medicine

## 2021-01-25 ENCOUNTER — Other Ambulatory Visit: Payer: Self-pay | Admitting: Internal Medicine

## 2021-01-27 DIAGNOSIS — N12 Tubulo-interstitial nephritis, not specified as acute or chronic: Secondary | ICD-10-CM | POA: Diagnosis not present

## 2021-01-31 DIAGNOSIS — H2512 Age-related nuclear cataract, left eye: Secondary | ICD-10-CM | POA: Diagnosis not present

## 2021-01-31 DIAGNOSIS — B5801 Toxoplasma chorioretinitis: Secondary | ICD-10-CM | POA: Diagnosis not present

## 2021-01-31 DIAGNOSIS — H35371 Puckering of macula, right eye: Secondary | ICD-10-CM | POA: Diagnosis not present

## 2021-01-31 DIAGNOSIS — H4089 Other specified glaucoma: Secondary | ICD-10-CM | POA: Diagnosis not present

## 2021-01-31 DIAGNOSIS — T8522XD Displacement of intraocular lens, subsequent encounter: Secondary | ICD-10-CM | POA: Diagnosis not present

## 2021-01-31 DIAGNOSIS — D2310 Other benign neoplasm of skin of unspecified eyelid, including canthus: Secondary | ICD-10-CM | POA: Diagnosis not present

## 2021-01-31 DIAGNOSIS — D1809 Hemangioma of other sites: Secondary | ICD-10-CM | POA: Diagnosis not present

## 2021-01-31 DIAGNOSIS — Z1810 Retained metal fragments, unspecified: Secondary | ICD-10-CM | POA: Diagnosis not present

## 2021-02-05 ENCOUNTER — Telehealth: Payer: Self-pay | Admitting: *Deleted

## 2021-02-05 NOTE — Chronic Care Management (AMB) (Signed)
  Chronic Care Management   Note  02/05/2021 Name: Adrian Rios MRN: [id number] DOB: [birth year]  Adrian Rios is a 80 y.o. year old male who is a primary care patient of Burns, Claudina Lick, MD. I reached out to Lelon Frohlich by phone today in response to a referral sent by [name] PCP.  [name] was given information about Chronic Care Management services today including:  CCM service includes personalized support from designated clinical staff supervised by his physician, including individualized plan of care and coordination with other care providers 24/7 contact phone numbers for assistance for urgent and routine care needs. Service will only be billed when office clinical staff spend 20 minutes or more in a month to coordinate care. Only one practitioner may furnish and bill the service in a calendar month. The patient may stop CCM services at any time (effective at the end of the month) by phone call to the office staff. The patient is responsible for co-pay (up to 20% after annual deductible is met) if co-pay is required by the individual health plan.   Patient agreed to services and verbal consent obtained.   Follow up plan: Telephone appointment with care management team member scheduled for:02/13/21  [facility]  Direct Dial: [phone]

## 2021-02-13 ENCOUNTER — Ambulatory Visit (INDEPENDENT_AMBULATORY_CARE_PROVIDER_SITE_OTHER): Payer: Medicare Other | Admitting: *Deleted

## 2021-02-13 DIAGNOSIS — D869 Sarcoidosis, unspecified: Secondary | ICD-10-CM

## 2021-02-13 DIAGNOSIS — Z8551 Personal history of malignant neoplasm of bladder: Secondary | ICD-10-CM

## 2021-02-13 DIAGNOSIS — J4520 Mild intermittent asthma, uncomplicated: Secondary | ICD-10-CM

## 2021-02-13 NOTE — Patient Instructions (Signed)
Visit Winigan, thank you for taking time to talk with me today. Please don't hesitate to contact me if I can be of assistance to you before our next scheduled telephone appointment.  Below are the goals we discussed today:  Patient Self-Care Activities: Patient Adrian Rios will: Take medications as prescribed Attend all scheduled provider appointments Call pharmacy for medication refills Call provider office for new concerns or questions Continue to follow heart healthy, low salt, low cholesterol diet Continue to stay as active as possible; keep up the great work walking for exercise when the weather is nice Continue to take effort to prevent falls If you continue to experience issues with ongoing cough with phlegm that is worse than normal- please consider making an appointment with [name] If you continue to ongoing experience issues with swallowing- please consider making an appointment with [name]  Our next scheduled telephone follow up visit/ appointment with care management team member is scheduled on:  Monday, April 21, 2021 at 11:30 am  If you need to cancel or re-schedule our visit, please call [phone] and our care guide team will be happy to assist you.   I look forward to hearing about your progress.   [name], RN, BSN, [place] [phone]: direct office  If you are experiencing a Mental Health or [place] or need someone to talk to, please  call the Suicide and Crisis Lifeline: 988 call the [place] National Suicide Prevention Lifeline: (223)062-9324 or TTY: 270-015-1897 TTY (619) 530-2197) to talk to a trained counselor call 1-800-273-TALK (toll free, 24 hour hotline) go to [facility] [place] ([phone]) call 911   Dysphagia Dysphagia is trouble swallowing. This condition occurs when solids and liquids stick  in a person's throat on the way down to the stomach, or when food takes longer to get to the stomach than usual. You may have problems swallowing food, liquids, or both. You may also have pain while trying to swallow. It may take you more time and effort to swallow something. What are the causes? This condition may be caused by: Muscle problems. These may make it difficult for you to move food and liquids through the esophagus, which is the tube that connects your mouth to your stomach. Blockages. You may have ulcers, scar tissue, or inflammation that blocks the normal passage of food and liquids. Causes of these problems include: Acid reflux from your stomach into your esophagus (gastroesophageal reflux). Infections. Radiation treatment for cancer. Medicines taken without enough fluids to wash them down into your stomach. Stroke. This can affect the nerves and make it difficult to swallow. Nerve problems. These prevent signals from being sent to the muscles of your esophagus to squeeze (contract) and move what you swallow down to your stomach. Globus pharyngeus. This is a common problem that involves a feeling like something is stuck in your throat or a sense of trouble with swallowing, even though nothing is wrong with the swallowing passages. Certain conditions, such as cerebral palsy or Parkinson's disease. What are the signs or symptoms? Common symptoms of this condition include: A feeling that solids or liquids are stuck in your throat on the way down to the stomach. Pain while swallowing. Coughing or gagging while trying to swallow. Other symptoms include: Food moving back from your stomach to your mouth (regurgitation). Noises coming from your throat. Chest discomfort when swallowing. A feeling of  fullness when swallowing. Drooling, especially when the throat is blocked. Heartburn. How is this diagnosed? This condition may be diagnosed by: Barium swallow X-ray. In this test, you  will swallow a white liquid that sticks to the inside of your esophagus. X-ray images are then taken. Endoscopy. In this test, a flexible telescope is inserted down your throat to look at your esophagus and your stomach. CT scans or an MRI. How is this treated? Treatment for dysphagia depends on the cause of this condition: If the dysphagia is caused by acid reflux or infection, medicines may be used. These may include antibiotics or heartburn medicines. If the dysphagia is caused by problems with the muscles, swallowing therapy may be used to help you strengthen your swallowing muscles. You may have to do specific exercises to strengthen the muscles or stretch them. If the dysphagia is caused by a blockage or mass, procedures to remove the blockage may be done. You may need surgery and a feeding tube. You may need to make diet changes. Ask your health care provider for specific instructions. Follow these instructions at home: Medicines Take over-the-counter and prescription medicines only as told by your health care provider. If you were prescribed an antibiotic medicine, take it as told by your health care provider. Do not stop taking the antibiotic even if you start to feel better. Eating and drinking  Make any diet changes as told by your health care provider. Work with a diet and nutrition specialist (dietitian) to create an eating plan that will help you get the nutrients you need in order to stay healthy. Eat soft foods that are easier to swallow. Cut your food into small pieces and eat slowly. Take small bites. Eat and drink only when you are sitting upright. Do not drink alcohol or caffeine. If you need help quitting, ask your health care provider. General instructions Check your weight every day to make sure you are not losing weight. Do not use any products that contain nicotine or tobacco. These products include cigarettes, chewing tobacco, and vaping devices, such as e-cigarettes.  If you need help quitting, ask your health care provider. Keep all follow-up visits. This is important. Contact a health care provider if: You lose weight because you cannot swallow. You cough when you drink liquids. You cough up partially digested food. Get help right away if: You cannot swallow your saliva. You have shortness of breath, a fever, or both. Your voice is hoarse and you have trouble swallowing. These symptoms may represent a serious problem that is an emergency. Do not wait to see if the symptoms will go away. Get medical help right away. Call your local emergency services (911 in the [place] Do not drive yourself to the hospital. Summary Dysphagia is trouble swallowing. This condition occurs when solids and liquids stick in a person's throat on the way down to the stomach. You may cough or gag while trying to swallow. Dysphagia has many possible causes. Treatment for dysphagia depends on the cause of the condition. Keep all follow-up visits. This is important. This information is not intended to replace advice given to you by your health care provider. Make sure you discuss any questions you have with your health care provider. Document Revised: 10/07/2019 Document Reviewed: 10/07/2019 Elsevier Patient Education  2022 [place] is a copy of your full care plan:  Care Plan : RN Care Manager Plan of Care  Updates made by Knox Royalty, RN since 02/13/2021 12:00 AM  Problem: Chronic Disease Management Needs   Priority: High     Long-Range Goal: Development of plan of care for long term chronic disease management   Start Date: 02/13/2021  Expected End Date: 02/13/2022  Priority: High  Note:   Current Barriers:  Chronic Disease Management support and education needs related to asthma, recurrent UTI  RNCM Clinical Goal(s):  Patient will demonstrate ongoing health management independence as evidenced by adherence to plan of care for self-  management of asthma, recurrent UTI        through collaboration with RN Care manager, provider, and care team.   Interventions: 1:1 collaboration with primary care provider regarding development and update of comprehensive plan of care as evidenced by provider attestation and co-signature Inter-disciplinary care team collaboration (see longitudinal plan of care) Evaluation of current treatment plan related to  self management and patient's adherence to plan as established by provider  Asthma: (Status: New goal.) Long Term Goal  Advised patient to track and manage Asthma triggers;  Provided instruction about proper use of medications used for management of Asthma including inhalers; Advised patient to self assesses Asthma action plan zone and make appointment with provider if in the yellow zone for 48 hours without improvement; Advised patient to engage in light exercise as tolerated 3-5 days a week to aid in the the management of Asthma; Provided education about and advised patient to utilize infection prevention strategies to reduce risk of respiratory infection; Discussed the importance of adequate rest and management of fatigue with Asthma; Screening for signs and symptoms of depression related to chronic disease state;  Assessed social determinant of health barriers;  Discussed with patient recent COVID (November 2022) diagnosis: feels as if he has recovered and is doing "overall well;" he reports ongoing cough with phlegm post-COVID that "seems to be" somewhat worse" than his baseline: discussed options for OTC medications; advised patient to schedule office visit with PCP if this continues and he is concerned that it is worse from his baseline Confirmed patient is adherent to medication, maintenance and rescue inhalers; confirmed he has a good understanding of dosing, use of, and scheduling of medications; confirmed patient independently self-manages medications Confirmed patient attempts to  stay active: reports "walks just about every day" for 30 minutes/ half mile: tolerates well; positive reinforcement provided with encouragement to continue efforts Confirmed patient received flu vaccine for 2022-23 flu/ winter season, received December 05, 2020 during PCP office visit Confirmed patient has had COVID vaccination series Confirmed no chronic pain Confirmed one fall without injury "several months ago;" slid off bed; was able to get self up; fall prevention education discussed; patient does not routinely use assistive devices for ambulation  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 Discussed plans with patient for ongoing care management follow up and provided patient with direct contact information for care management team Reviewed medications with patient and discussed importance of adherence to medication regimen ; Reviewed scheduled/upcoming provider appointments including March 2023- urology provider at [facility]; 08/05/21- PCP; Discussed plans with patient for ongoing care management follow up and provided patient with direct contact information for care management team; Confirmed patient is followed regularly by urology providers at [facility] Confirmed wife assists as indicated/ needed for management of ileal conduit- patient reports managing well Initiated education around signs/  symptoms UTI: patient has good baseline understanding of same; reports action plan of "seeks advice/ care immediately" if he notices signs/ symptoms UTI Confirmed patient attempts to follow low salt diet: for prevention/ management of possible kidney stones  Patient Goals/Self-Care Activities: As evidenced by review of EHR, collaboration with care team, and patient reporting during CCM RN CM outreach, Patient  Adrian Rios will: Take medications as prescribed Attend all scheduled provider appointments Call pharmacy for medication refills Call provider office for new concerns or questions Continue to follow heart healthy, low salt, low cholesterol diet Continue to stay as active as possible; keep up the great work walking for exercise when the weather is nice Continue to take effort to prevent falls If you continue to experience issues with ongoing cough with phlegm that is worse than normal- please consider making an appointment with [name]      Consent to CCM Services: Adrian Rios was given information about Chronic Care Management services 02/05/21 including:  CCM service includes personalized support from designated clinical staff supervised by his physician, including individualized plan of care and coordination with other care providers 24/7 contact phone numbers for assistance for urgent and routine care needs. Service will only be billed when office clinical staff spend 20 minutes or more in a month to coordinate care. Only one practitioner may furnish and bill the service in a calendar month. The patient may stop CCM services at any time (effective at the end of the month) by phone call to the office staff. The patient will be responsible for cost sharing (co-pay) of up to 20% of the service fee (after annual deductible is met).  Patient agreed to services and verbal consent obtained.   Patient verbalizes understanding of instructions provided today and agrees to view in MyChart Telephone follow up appointment with care management team member scheduled for:  Monday, April 21, 2021 at 11:30 am The patient has been provided with contact information for the care management team and has been advised to call with any health related questions or concerns

## 2021-02-13 NOTE — Chronic Care Management (AMB) (Signed)
Chronic Care Management   CCM RN Visit Note  02/13/2021 Name: Adrian Rios MRN: [id number] DOB: May 24, 1940  Subjective: Adrian Rios is a 80 y.o. year old male who is a primary care patient of Burns, Claudina Lick, MD. The care management team was consulted for assistance with disease management and care coordination needs.    Engaged with patient by telephone for initial visit in response to provider referral for case management and/or care coordination services.   Consent to Services:  The patient was given information about Chronic Care Management services, agreed to services, and gave verbal consent 02/05/21 prior to initiation of services.  Please see initial visit note for detailed documentation.  Patient agreed to services and verbal consent obtained.   Assessment: Review of patient past medical history, allergies, medications, health status, including review of consultants reports, laboratory and other test data, was performed as part of comprehensive evaluation and provision of chronic care management services.   SDOH (Social Determinants of Health) assessments and interventions performed:  SDOH Interventions    Flowsheet Row Most Recent Value  SDOH Interventions   Food Insecurity Interventions Intervention Not Indicated  Housing Interventions Intervention Not Indicated  [Single family home x 23 years- 2 stories,  lives on main level]  Transportation Interventions Intervention Not Indicated  [Patient drives self and wife assists as indicated]      CCM Care Plan Allergies  Allergen Reactions   Ivp Dye [Iodinated Diagnostic Agents] Shortness Of Breath, Itching and Palpitations    "eyes itching,  Heart racing,  Effected breathing" 10-27-16 pt with 13 hr pre-meds for CT without any reaction-kj   Metrizamide Itching, Palpitations and Shortness Of Breath    "eyes itching,  Heart racing,  Effected breathing" "eyes itching,  Heart racing,  Effected breathing" 10-27-16 pt with 13 hr  pre-meds for CT without any reaction-kj   Shellfish Allergy Hives, Shortness Of Breath and Itching    Mostly crab   Gabapentin Other (See Comments)    REACTION: dizziness and flushing   Niacin-Lovastatin Er Other (See Comments)    Did not feel good on medication   Gadolinium Hives   Adhesive [Tape] Rash    Use paper tape   Other Rash    Use paper tape   Outpatient Encounter Medications as of 02/13/2021  Medication Sig Note   acetaminophen (TYLENOL) 500 MG tablet Take 500 mg by mouth every 6 (six) hours as needed.    albuterol (VENTOLIN HFA) 108 (90 Base) MCG/ACT inhaler Inhale 2 puffs into the lungs every 6 (six) hours as needed for wheezing or shortness of breath. 02/13/2021: 02/13/21- reports uses approximately "every couple of weeks" at base line   Fluticasone-Salmeterol 232-14 MCG/ACT AEPB Inhale 1 puff into the lungs in the morning and at bedtime. 02/13/2021: 02/13/21- reports uses only qHS   latanoprost (XALATAN) 0.005 % ophthalmic solution Place 1 drop into the right eye at bedtime.    magnesium oxide (MAG-OX) 400 MG tablet Take 400 mg by mouth daily.    Misc Natural Products (GLUCOSAMINE CHOND DOUBLE STR PO) Take 1 tablet by mouth 2 (two) times daily.    montelukast (SINGULAIR) 10 MG tablet Take 1 tablet (10 mg total) by mouth at bedtime.    triamcinolone (NASACORT) 55 MCG/ACT AERO nasal inhaler Place 1 spray into the nose at bedtime.    venlafaxine (EFFEXOR) 75 MG tablet Take 1.5 tablets (112.5 mg total) by mouth daily.    cephALEXin (KEFLEX) 500 MG capsule Take 1 capsule (500  mg total) by mouth 4 (four) times daily. (Patient not taking: Reported on 02/13/2021) 02/13/2021: 02/13/21- Completed as instructed   No facility-administered encounter medications on file as of 02/13/2021.   Patient Active Problem List   Diagnosis Date Noted   UTI (urinary tract infection) 11/24/2020   Sepsis ([place]) 11/24/2020   History of ESBL E. coli infection 11/24/2020   Hydronephrosis 11/24/2020    Cough 09/06/2020   Low back pain 08/03/2020   Bilateral leg edema 02/16/2020   Chronic pain of both shoulders 08/04/2019   COVID-19 02/28/2019   Dizziness 06/21/2018   Gait disorder 01/24/2018   Left hip pain 11/11/2017   Abdominal wall hernia 05/26/2017   Recurrent urinary tract infection 11/13/2016   Chronic diastolic CHF (congestive heart failure) ([place]) 11/12/2016   Fatigue 08/19/2016   History of bladder cancer 06/18/2016   History of prostate cancer 06/18/2016   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[place]) 06/12/2016   Carotid bruit 02/17/2016   Prediabetes 08/20/2015   Glaucoma 02/14/2015   Varicose veins of lower extremities with other complications 72/53/6644   Asthma 10/27/2011   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[place]), 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   PREMATURE VENTRICULAR CONTRACTIONS 04/29/2010   ERECTILE DYSFUNCTION 04/23/2010   [place] DISEASE, LUMBOSACRAL SPINE 02/06/2010   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   Depression 04/12/2008   Allergic rhinitis 04/12/2008   Hyperlipidemia 10/05/2006   Sarcoidosis ([place]) 07/05/2006   Conditions to be addressed/monitored:  Asthma and Recurrent UTI  Care Plan : RN Care Manager Plan of Care  Updates made by Knox Royalty, RN since 02/13/2021 12:00 AM     Problem: Chronic Disease Management Needs   Priority: High     Long-Range Goal: Development of plan of care for long term chronic disease management   Start Date: 02/13/2021  Expected End Date: 02/13/2022  Priority: High  Note:   Current Barriers:  Chronic Disease Management support and education needs related to asthma, recurrent UTI  RNCM Clinical Goal(s):  Patient will demonstrate ongoing health management independence as evidenced by adherence to plan of care for self- management of asthma, recurrent UTI        through collaboration with RN Care manager, provider, and care team.   Interventions: 1:1 collaboration with primary care provider regarding development and update of comprehensive plan of care  as evidenced by provider attestation and co-signature Inter-disciplinary care team collaboration (see longitudinal plan of care) Evaluation of current treatment plan related to  self management and patient's adherence to plan as established by provider  Asthma: (Status: New goal.) Long Term Goal  Advised patient to track and manage Asthma triggers;  Provided instruction about proper use of medications used for management of Asthma including inhalers; Advised patient to self assesses Asthma action plan zone and make appointment with provider if in the yellow zone for 48 hours without improvement; Advised patient to engage in light exercise as tolerated 3-5 days a week to aid in the the management of Asthma; Provided education about and advised patient to utilize infection prevention strategies to reduce risk of respiratory infection; Discussed the importance of adequate rest and management of fatigue with Asthma; Screening for signs and symptoms of depression related to chronic disease state;  Assessed social determinant of health barriers;  Discussed with patient recent COVID (November 2022) diagnosis: feels as if he has recovered and is doing "overall well;" he reports ongoing cough with phlegm post-COVID that "seems to be" somewhat worse" than his baseline: discussed options for OTC medications; advised patient to schedule office visit  with PCP if this continues and he is concerned that it is worse from his baseline Patient brings up that he has experienced recent intermittent issues around swallowing food- denies difficulty with swallowing liquids; states "happens every so often;" reports relieved by taking liquid in to facilitate swallowing process; encouraged to follow up with [name] if this persists Confirmed patient is adherent to medication, maintenance and rescue inhalers; confirmed he has a good understanding of dosing, use of, and scheduling of medications; confirmed patient  independently self-manages medications Confirmed patient attempts to stay active: reports "walks just about every day" for 30 minutes/ half mile: tolerates well; positive reinforcement provided with encouragement to continue efforts Confirmed patient received flu vaccine for 2022-23 flu/ winter season, received December 05, 2020 during PCP office visit Confirmed patient has had COVID vaccination series Confirmed no chronic pain Confirmed one fall without injury "several months ago;" slid off bed; was able to get self up; fall prevention education discussed; patient does not routinely use assistive devices for ambulation  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 Discussed plans with patient for ongoing care management follow up and provided patient with direct contact information for care management team Reviewed medications with patient and discussed importance of adherence to medication regimen ; Reviewed scheduled/upcoming provider appointments including March 2023- urology provider at [facility]; 08/05/21- PCP; Discussed plans with patient for ongoing care management follow up and provided patient with direct contact information for care management team; Confirmed patient is followed regularly by [facility] providers at [facility] Confirmed wife assists as indicated/ needed for management of ileal conduit- patient reports managing well Initiated education around signs/ symptoms UTI: patient has good baseline understanding of same; reports action plan of "seeks advice/ care immediately" if he notices signs/ symptoms UTI Confirmed patient attempts to follow low salt diet: for prevention/ management of possible kidney stones  Patient Goals/Self-Care Activities: As evidenced by review of EHR, collaboration with  care team, and patient reporting during CCM RN CM outreach, Patient Kaydn will: Take medications as prescribed Attend all scheduled provider appointments Call pharmacy for medication refills Call provider office for new concerns or questions Continue to follow heart healthy, low salt, low cholesterol diet Continue to stay as active as possible; keep up the great work walking for exercise when the weather is nice Continue to take effort to prevent falls If you continue to experience issues with ongoing cough with phlegm that is worse than normal- please consider making an appointment with [name] If you continue to ongoing experience issues with swallowing- please consider making an appointment with [name]      Plan: Telephone follow up appointment with care management team member scheduled for:  Monday, April 21, 2021 at 11:30 am The patient has been provided with contact information for the care management team and has been advised to call with any health related questions or concerns  [name], RN, BSN, [place] [phone]: direct office

## 2021-03-06 ENCOUNTER — Other Ambulatory Visit: Payer: Medicare Other

## 2021-03-06 ENCOUNTER — Other Ambulatory Visit: Payer: Self-pay

## 2021-03-06 ENCOUNTER — Telehealth: Payer: Self-pay | Admitting: Internal Medicine

## 2021-03-06 DIAGNOSIS — R300 Dysuria: Secondary | ICD-10-CM

## 2021-03-06 LAB — URINALYSIS, ROUTINE W REFLEX MICROSCOPIC
Bilirubin Urine: NEGATIVE
Hgb urine dipstick: NEGATIVE
Ketones, ur: NEGATIVE
Nitrite: POSITIVE — AB
RBC / HPF: NONE SEEN (ref 0–?)
Specific Gravity, Urine: <=1.005 — AB (ref 1.000–1.030)
Total Protein, Urine: NEGATIVE
Urine Glucose: NEGATIVE
Urobilinogen, UA: 0.2 (ref 0.0–1.0)
pH: 6 (ref 5.0–8.0)

## 2021-03-06 MED ORDER — NITROFURANTOIN MONOHYD MACRO 100 MG PO CAPS: 100.0000 mg | ORAL_CAPSULE | Freq: Two times a day (BID) | ORAL | 0 refills | Status: DC

## 2021-03-06 NOTE — Telephone Encounter (Signed)
Patient's spouse states patient has a fever of 100.7  Caller requesting advice from provider if patient should be prescribed antibiotics or taken to the ed  Please advise

## 2021-03-07 NOTE — Telephone Encounter (Signed)
Spoke with patient today.  Went over lab results for UA and abx picked up this morning.

## 2021-03-09 LAB — CULTURE, URINE COMPREHENSIVE

## 2021-03-15 ENCOUNTER — Encounter: Payer: Self-pay | Admitting: Internal Medicine

## 2021-03-15 DIAGNOSIS — N390 Urinary tract infection, site not specified: Secondary | ICD-10-CM

## 2021-03-16 ENCOUNTER — Inpatient Hospital Stay (HOSPITAL_BASED_OUTPATIENT_CLINIC_OR_DEPARTMENT_OTHER)
Admission: EM | Admit: 2021-03-16 | Discharge: 2021-03-19 | DRG: 871 | Disposition: A | Discharge status: Home / Self Care | Payer: Medicare Other | Attending: Internal Medicine | Admitting: Internal Medicine

## 2021-03-16 ENCOUNTER — Emergency Department (HOSPITAL_BASED_OUTPATIENT_CLINIC_OR_DEPARTMENT_OTHER): Payer: Medicare Other

## 2021-03-16 ENCOUNTER — Other Ambulatory Visit: Payer: Self-pay

## 2021-03-16 ENCOUNTER — Encounter (HOSPITAL_BASED_OUTPATIENT_CLINIC_OR_DEPARTMENT_OTHER): Payer: Self-pay

## 2021-03-16 DIAGNOSIS — N136 Pyonephrosis: Secondary | ICD-10-CM | POA: Diagnosis present

## 2021-03-16 DIAGNOSIS — N182 Chronic kidney disease, stage 2 (mild): Secondary | ICD-10-CM | POA: Diagnosis present

## 2021-03-16 DIAGNOSIS — Z8551 Personal history of malignant neoplasm of bladder: Secondary | ICD-10-CM | POA: Diagnosis not present

## 2021-03-16 DIAGNOSIS — N12 Tubulo-interstitial nephritis, not specified as acute or chronic: Secondary | ICD-10-CM | POA: Diagnosis present

## 2021-03-16 DIAGNOSIS — Z823 Family history of stroke: Secondary | ICD-10-CM

## 2021-03-16 DIAGNOSIS — N183 Chronic kidney disease, stage 3 unspecified: Secondary | ICD-10-CM

## 2021-03-16 DIAGNOSIS — D72829 Elevated white blood cell count, unspecified: Secondary | ICD-10-CM

## 2021-03-16 DIAGNOSIS — N1831 Chronic kidney disease, stage 3a: Secondary | ICD-10-CM

## 2021-03-16 DIAGNOSIS — N170 Acute kidney failure with tubular necrosis: Secondary | ICD-10-CM | POA: Diagnosis present

## 2021-03-16 DIAGNOSIS — N281 Cyst of kidney, acquired: Secondary | ICD-10-CM | POA: Diagnosis not present

## 2021-03-16 DIAGNOSIS — Z803 Family history of malignant neoplasm of breast: Secondary | ICD-10-CM

## 2021-03-16 DIAGNOSIS — K5730 Diverticulosis of large intestine without perforation or abscess without bleeding: Secondary | ICD-10-CM | POA: Diagnosis not present

## 2021-03-16 DIAGNOSIS — E785 Hyperlipidemia, unspecified: Secondary | ICD-10-CM | POA: Diagnosis present

## 2021-03-16 DIAGNOSIS — J150 Pneumonia due to Klebsiella pneumoniae: Secondary | ICD-10-CM | POA: Diagnosis present

## 2021-03-16 DIAGNOSIS — R109 Unspecified abdominal pain: Secondary | ICD-10-CM

## 2021-03-16 DIAGNOSIS — E860 Dehydration: Secondary | ICD-10-CM | POA: Diagnosis present

## 2021-03-16 DIAGNOSIS — K219 Gastro-esophageal reflux disease without esophagitis: Secondary | ICD-10-CM | POA: Diagnosis present

## 2021-03-16 DIAGNOSIS — Z888 Allergy status to other drugs, medicaments and biological substances status: Secondary | ICD-10-CM | POA: Diagnosis not present

## 2021-03-16 DIAGNOSIS — D869 Sarcoidosis, unspecified: Secondary | ICD-10-CM | POA: Diagnosis present

## 2021-03-16 DIAGNOSIS — Z79899 Other long term (current) drug therapy: Secondary | ICD-10-CM | POA: Diagnosis not present

## 2021-03-16 DIAGNOSIS — Z906 Acquired absence of other parts of urinary tract: Secondary | ICD-10-CM

## 2021-03-16 DIAGNOSIS — Z85820 Personal history of malignant melanoma of skin: Secondary | ICD-10-CM | POA: Diagnosis not present

## 2021-03-16 DIAGNOSIS — F419 Anxiety disorder, unspecified: Secondary | ICD-10-CM | POA: Diagnosis present

## 2021-03-16 DIAGNOSIS — N1330 Unspecified hydronephrosis: Secondary | ICD-10-CM | POA: Diagnosis not present

## 2021-03-16 DIAGNOSIS — Z936 Other artificial openings of urinary tract status: Secondary | ICD-10-CM

## 2021-03-16 DIAGNOSIS — Z91041 Radiographic dye allergy status: Secondary | ICD-10-CM | POA: Diagnosis not present

## 2021-03-16 DIAGNOSIS — N200 Calculus of kidney: Secondary | ICD-10-CM | POA: Diagnosis not present

## 2021-03-16 DIAGNOSIS — Z8616 Personal history of COVID-19: Secondary | ICD-10-CM

## 2021-03-16 DIAGNOSIS — A4159 Other Gram-negative sepsis: Secondary | ICD-10-CM | POA: Diagnosis present

## 2021-03-16 DIAGNOSIS — Z91013 Allergy to seafood: Secondary | ICD-10-CM | POA: Diagnosis not present

## 2021-03-16 DIAGNOSIS — Z8546 Personal history of malignant neoplasm of prostate: Secondary | ICD-10-CM

## 2021-03-16 DIAGNOSIS — F32A Depression, unspecified: Secondary | ICD-10-CM | POA: Diagnosis present

## 2021-03-16 DIAGNOSIS — R10A1 Flank pain, right side: Secondary | ICD-10-CM

## 2021-03-16 DIAGNOSIS — B964 Proteus (mirabilis) (morganii) as the cause of diseases classified elsewhere: Secondary | ICD-10-CM | POA: Diagnosis present

## 2021-03-16 DIAGNOSIS — Z20822 Contact with and (suspected) exposure to covid-19: Secondary | ICD-10-CM | POA: Diagnosis present

## 2021-03-16 DIAGNOSIS — Z8249 Family history of ischemic heart disease and other diseases of the circulatory system: Secondary | ICD-10-CM

## 2021-03-16 DIAGNOSIS — R7881 Bacteremia: Secondary | ICD-10-CM | POA: Diagnosis not present

## 2021-03-16 DIAGNOSIS — Z8619 Personal history of other infectious and parasitic diseases: Secondary | ICD-10-CM | POA: Diagnosis present

## 2021-03-16 LAB — COMPREHENSIVE METABOLIC PANEL
ALT: 73 U/L — ABNORMAL HIGH (ref 0–44)
AST: 51 U/L — ABNORMAL HIGH (ref 15–41)
Albumin: 3.2 g/dL — ABNORMAL LOW (ref 3.5–5.0)
Alkaline Phosphatase: 141 U/L — ABNORMAL HIGH (ref 38–126)
Anion gap: 9 (ref 5–15)
BUN: 33 mg/dL — ABNORMAL HIGH (ref 8–23)
CO2: 19 mmol/L — ABNORMAL LOW (ref 22–32)
Calcium: 8.8 mg/dL — ABNORMAL LOW (ref 8.9–10.3)
Chloride: 106 mmol/L (ref 98–111)
Creatinine, Ser: 1.54 mg/dL — ABNORMAL HIGH (ref 0.61–1.24)
GFR, Estimated: 45 mL/min — ABNORMAL LOW (ref >60)
Glucose, Bld: 118 mg/dL — ABNORMAL HIGH (ref 70–99)
Potassium: 3.7 mmol/L (ref 3.5–5.1)
Sodium: 134 mmol/L — ABNORMAL LOW (ref 135–145)
Total Bilirubin: 1 mg/dL (ref 0.3–1.2)
Total Protein: 7.1 g/dL (ref 6.5–8.1)

## 2021-03-16 LAB — CBC WITH DIFFERENTIAL/PLATELET
Abs Immature Granulocytes: 0.1 10*3/uL — ABNORMAL HIGH (ref 0.00–0.07)
Basophils Absolute: 0.1 10*3/uL (ref 0.0–0.1)
Basophils Relative: 0 %
Eosinophils Absolute: 0.1 10*3/uL (ref 0.0–0.5)
Eosinophils Relative: 0 %
HCT: 38.3 % — ABNORMAL LOW (ref 39.0–52.0)
Hemoglobin: 12.9 g/dL — ABNORMAL LOW (ref 13.0–17.0)
Immature Granulocytes: 1 %
Lymphocytes Relative: 8 %
Lymphs Abs: 1.7 10*3/uL (ref 0.7–4.0)
MCH: 30.6 pg (ref 26.0–34.0)
MCHC: 33.7 g/dL (ref 30.0–36.0)
MCV: 90.8 fL (ref 80.0–100.0)
Monocytes Absolute: 1.2 10*3/uL — ABNORMAL HIGH (ref 0.1–1.0)
Monocytes Relative: 6 %
Neutro Abs: 17.9 10*3/uL — ABNORMAL HIGH (ref 1.7–7.7)
Neutrophils Relative %: 85 %
Platelets: 362 10*3/uL (ref 150–400)
RBC: 4.22 MIL/uL (ref 4.22–5.81)
RDW: 13.2 % (ref 11.5–15.5)
WBC: 21 10*3/uL — ABNORMAL HIGH (ref 4.0–10.5)
nRBC: 0 % (ref 0.0–0.2)

## 2021-03-16 LAB — URINALYSIS, MICROSCOPIC (REFLEX): WBC, UA: >50 WBC/hpf (ref 0–5)

## 2021-03-16 LAB — URINALYSIS, ROUTINE W REFLEX MICROSCOPIC
Bilirubin Urine: NEGATIVE
Glucose, UA: NEGATIVE mg/dL
Ketones, ur: NEGATIVE mg/dL
Nitrite: POSITIVE — AB
Protein, ur: 30 mg/dL — AB
Specific Gravity, Urine: 1.015 (ref 1.005–1.030)
pH: 6.5 (ref 5.0–8.0)

## 2021-03-16 LAB — LIPASE, BLOOD: Lipase: 28 U/L (ref 11–51)

## 2021-03-16 LAB — LACTIC ACID, PLASMA: Lactic Acid, Venous: 0.9 mmol/L (ref 0.5–1.9)

## 2021-03-16 MED ORDER — SODIUM CHLORIDE 0.9 % IV SOLN
1.0000 g | Freq: Once | INTRAVENOUS | Status: AC
  Administered 2021-03-16: 1 g via INTRAVENOUS

## 2021-03-16 NOTE — Progress Notes (Addendum)
HOSPITAL MEDICINE ACCEPTANCE NOTE    81 year old male with past medical history of bladder cancer status post cystectomy with ileal conduit (05/2016), following with urology at [facility], recurrent ESBL urinary tract infections/pyelonephritis, sarcoidosis, depression, chronic bilateral hydronephrosis who presents to [place] emergency department with complaints of right flank pain.  According to the emergency department provider, patient was recently treated with a course of pyelonephritis after being diagnosed with a urinary tract infection on 1/5.  At that time, urine culture had grown out ESBL E. coli, sensitive to nitrofurantoin and patient was therefore prescribed nitrofurantoin.  Despite taking this medication patient is continued to experience symptoms of flank pain and had a fever at home of 101.9 F.  Upon evaluation in the emergency department patient has been found to have a substantial leukocytosis of 21.  Patient initiated on intravenous meropenem after ER provider discussed case with pharmacy.  CT imaging of the abdomen without contrast was performed revealing no evidence of an obstructing stone.  Continued evidence of substantial bilateral chronic hydronephrosis however that radiology feels is stable.  Bed request placed for a telemetry bed at [facility].   Vernelle Emerald  MD [facility]

## 2021-03-16 NOTE — ED Triage Notes (Signed)
Pt arrives with c/o pain to right flank area states that happens to him about once a month that he gets a kidney infection. Pt reports taking tylenol around 4 pm today. 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 Pt does have history of bladder cancer and has urology appointment March 7th. Pt does have known kidney stones and antibiotic resistance.

## 2021-03-16 NOTE — ED Notes (Signed)
Food given to pt, wife has gone home has requested RN call and let her know what room he will be admitted to.

## 2021-03-16 NOTE — ED Provider Notes (Signed)
[facility] EMERGENCY DEPARTMENT Provider Note   CSN: [id number] Arrival date & time: 03/16/21  1822     History  Chief Complaint  Patient presents with   Flank Pain    Adrian Rios is a 81 y.o. male.  The history is provided by the patient, medical records and the spouse. No language interpreter was used.  Flank Pain This is a recurrent problem. The current episode started yesterday. The problem occurs constantly. The problem has not changed since onset.Associated symptoms include abdominal pain (r flank). Pertinent negatives include no chest pain, no headaches and no shortness of breath. Nothing aggravates the symptoms. Nothing relieves the symptoms. He has tried nothing for the symptoms. The treatment provided no relief.      Home Medications Prior to Admission medications   Medication Sig Start Date End Date Taking? Authorizing Provider  acetaminophen (TYLENOL) 500 MG tablet Take 500 mg by mouth every 6 (six) hours as needed.    [provider]  albuterol (VENTOLIN HFA) 108 (90 Base) MCG/ACT inhaler Inhale 2 puffs into the lungs every 6 (six) hours as needed for wheezing or shortness of breath. 02/01/20   [name], MD  cephALEXin (KEFLEX) 500 MG capsule Take 1 capsule (500 mg total) by mouth 4 (four) times daily. Patient not taking: Reported on 02/13/2021 01/05/21   Fredia Sorrow, MD  Fluticasone-Salmeterol [phone] MCG/ACT AEPB Inhale 1 puff into the lungs in the morning and at bedtime. 12/22/20   Burns, Claudina Lick, MD  latanoprost (XALATAN) 0.005 % ophthalmic solution Place 1 drop into the right eye at bedtime. 05/06/20   [provider]  magnesium oxide (MAG-OX) 400 MG tablet Take 400 mg by mouth daily.    [provider]  Misc Natural Products (GLUCOSAMINE CHOND DOUBLE STR PO) Take 1 tablet by mouth 2 (two) times daily.    [provider]  montelukast (SINGULAIR) 10 MG tablet Take 1 tablet (10 mg total) by mouth at bedtime.  01/27/21   [name], MD  nitrofurantoin, macrocrystal-monohydrate, (MACROBID) 100 MG capsule Take 1 capsule (100 mg total) by mouth 2 (two) times daily. 03/06/21   [name], MD  triamcinolone (NASACORT) 55 MCG/ACT AERO nasal inhaler Place 1 spray into the nose at bedtime.    [provider]  venlafaxine (EFFEXOR) 75 MG tablet Take 1.5 tablets (112.5 mg total) by mouth daily. 01/08/21   [name], MD      Allergies    Ivp dye [iodinated contrast media], Metrizamide, Shellfish allergy, Gabapentin, Niacin-lovastatin er, Gadolinium, Adhesive [tape], and Other    Review of Systems   Review of Systems  Constitutional:  Positive for chills, fatigue and fever.  HENT:  Negative for congestion.   Respiratory:  Negative for cough, chest tightness, shortness of breath and wheezing.   Cardiovascular:  Negative for chest pain, palpitations and leg swelling.  Gastrointestinal:  Positive for abdominal pain (r flank) and nausea. Negative for constipation, diarrhea and vomiting.  Genitourinary:  Positive for flank pain.  Musculoskeletal:  Positive for back pain (r flank). Negative for neck pain and neck stiffness.  Neurological:  Negative for dizziness and headaches.  Psychiatric/Behavioral:  Negative for agitation.   All other systems reviewed and are negative.  Physical Exam Updated Vital Signs BP 133/65 (BP Location: Right Arm)    Pulse (!) 116    Temp 99.1 F (37.3 C) (Oral)    Resp 16    Ht 5\' 7"  (1.702 m)  Wt 79.4 kg    SpO2 92%    BMI 27.41 kg/m  Physical Exam Vitals and nursing note reviewed.  Constitutional:      General: He is not in acute distress.    Appearance: He is well-developed. He is not ill-appearing, toxic-appearing or diaphoretic.  HENT:     Head: Normocephalic and atraumatic.     Nose: No congestion or rhinorrhea.     Mouth/Throat:     Mouth: Mucous membranes are moist.  Eyes:     Conjunctiva/sclera: Conjunctivae normal.  Cardiovascular:     Rate  and Rhythm: Regular rhythm. Tachycardia present.     Heart sounds: No murmur heard. Pulmonary:     Effort: Pulmonary effort is normal. No respiratory distress.     Breath sounds: Normal breath sounds. No wheezing or rales.  Chest:     Chest wall: No tenderness.  Abdominal:     General: Abdomen is flat.     Palpations: Abdomen is soft.     Tenderness: There is abdominal tenderness (r flank). There is right CVA tenderness. There is no left CVA tenderness.     Comments: Ileostomy in place with no tenderness   Musculoskeletal:        General: Tenderness present. No swelling.     Cervical back: Neck supple. No tenderness.     Right lower leg: No edema.     Left lower leg: No edema.  Skin:    General: Skin is warm and dry.     Capillary Refill: Capillary refill takes less than 2 seconds.     Findings: No erythema.  Neurological:     Mental Status: He is alert.  Psychiatric:        Mood and Affect: Mood normal.    ED Results / Procedures / Treatments   Labs (all labs ordered are listed, but only abnormal results are displayed) Labs Reviewed  URINALYSIS, ROUTINE W REFLEX MICROSCOPIC - Abnormal; Notable for the following components:      Result Value   APPearance CLOUDY (*)    Hgb urine dipstick SMALL (*)    Protein, ur 30 (*)    Nitrite POSITIVE (*)    Leukocytes,Ua LARGE (*)    All other components within normal limits  URINALYSIS, MICROSCOPIC (REFLEX) - Abnormal; Notable for the following components:   Bacteria, UA MANY (*)    All other components within normal limits  CBC WITH DIFFERENTIAL/PLATELET - Abnormal; Notable for the following components:   WBC 21.0 (*)    Hemoglobin 12.9 (*)    HCT 38.3 (*)    Neutro Abs 17.9 (*)    Monocytes Absolute 1.2 (*)    Abs Immature Granulocytes 0.10 (*)    All other components within normal limits  COMPREHENSIVE METABOLIC PANEL - Abnormal; Notable for the following components:   Sodium 134 (*)    CO2 19 (*)    Glucose, Bld 118 (*)     BUN 33 (*)    Creatinine, Ser 1.54 (*)    Calcium 8.8 (*)    Albumin 3.2 (*)    AST 51 (*)    ALT 73 (*)    Alkaline Phosphatase 141 (*)    GFR, Estimated 45 (*)    All other components within normal limits  URINE CULTURE  CULTURE, BLOOD (ROUTINE X 2)  CULTURE, BLOOD (ROUTINE X 2)  LACTIC ACID, PLASMA  LIPASE, BLOOD  LACTIC ACID, PLASMA    EKG None  Radiology CT Renal Stone Study  Result Date: 03/16/2021 CLINICAL DATA:  Right flank pain. EXAM: CT ABDOMEN AND PELVIS WITHOUT CONTRAST TECHNIQUE: Multidetector CT imaging of the abdomen and pelvis was performed following the standard protocol without IV contrast. RADIATION DOSE REDUCTION: This exam was performed according to the departmental dose-optimization program which includes automated exposure control, adjustment of the mA and/or kV according to patient size and/or use of iterative reconstruction technique. COMPARISON:  January 05, 2021 FINDINGS: Lower chest: No acute abnormality. Hepatobiliary: No focal liver abnormality is seen. No gallstones, gallbladder wall thickening, or biliary dilatation. Pancreas: Unremarkable. No pancreatic ductal dilatation or surrounding inflammatory changes. Spleen: Normal in size without focal abnormality. Adrenals/Urinary Tract: Adrenal glands are unremarkable. Kidneys are normal in size. A 3.2 cm cyst is seen along the anterior aspect of the mid to lower left kidney. A 2.1 cm exophytic cyst is seen along the posterior aspect of the mid to lower right kidney. 2 mm and 4 mm nonobstructing renal calculi are seen within the mid and lower right kidney. Moderate to marked severity bilateral hydronephrosis and hydroureter are noted, without evidence of obstructing renal calculi. The urinary bladder is surgically absent. Stomach/Bowel: Stomach is within normal limits. The appendix is surgically absent. Surgically anastomosed bowel is seen within the right lower quadrant. No evidence of bowel dilatation.  Noninflamed diverticula are seen throughout the sigmoid colon. Vascular/Lymphatic: Aortic atherosclerosis. No enlarged abdominal or pelvic lymph nodes. Reproductive: The prostate gland is surgically absent. Other: A right lower quadrant ostomy site is seen. No abdominopelvic ascites. Musculoskeletal: No acute or significant osseous findings. IMPRESSION: 1. Findings consistent with transurethral resection of the urinary bladder, with predominant stable moderate to marked severity bilateral hydronephrosis and hydroureter, in the absence of obstructing renal calculi. 2. Subcentimeter nonobstructing right renal calculi. 3. Sigmoid diverticulosis. Aortic Atherosclerosis (ICD10-I70.0). Electronically Signed   By: Virgina Norfolk M.D.   On: 03/16/2021 21:50    Procedures Procedures    CRITICAL CARE Performed by: Gwenyth Allegra Daylani Deblois Total critical care time: 35 minutes Critical care time was exclusive of separately billable procedures and treating other patients. Critical care was necessary to treat or prevent imminent or life-threatening deterioration. Critical care was time spent personally by me on the following activities: development of treatment plan with patient and/or surrogate as well as nursing, discussions with consultants, evaluation of patient's response to treatment, examination of patient, obtaining history from patient or surrogate, ordering and performing treatments and interventions, ordering and review of laboratory studies, ordering and review of radiographic studies, pulse oximetry and re-evaluation of patient's condition.   Medications Ordered in ED Medications  meropenem (MERREM) 1 g in sodium chloride 0.9 % 100 mL IVPB (has no administration in time range)    ED Course/ Medical Decision Making/ A&P                           Medical Decision Making  BRITT PETRONI is a 81 y.o. 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  According to patient, he completed antibiotics with Macrobid managed by ID several days ago and since yesterday has had recurrent symptoms of severe right flank pain waxing and waning, some nausea, and some urine  cloudiness.  He is concerned that he has been septic before and states that he nearly died and wants to make sure it "does not get to that point".  He has been taking some Tylenol and his fever was just under febrile numbers today but he was still having the waxing and waning flank pains.  He does say he has had kidney stones on that side before.  On exam, right flank is tender to palpation.  Abdomen is otherwise nontender.  No tenderness around his ileal conduit with cloudy urine in the bag.  Lungs otherwise clear and chest nontender.  Moving all extremities.  Patient is tachycardic and warm to the touch.  He reports a temperature of 1-1.9 before arrival and is now 99.1 here orally.  We will check a rectal temp.   Clinically I am concerned that patient has recurrent pyelonephritis that is failed outpatient antibiotics.  I am also concerned with his history of ESBL and other complex resistant infections.  I am also concerned about his previous kidney stones and his waxing and waning severe pain on the right side.  We will get stone study and labs and will get a urine and culture.  Urinalysis does show nitrates, leukocytes, and bacteria concerning for UTI.  We will get the labs and imaging and anticipate he will need IV antibiotics and admission given his fever at home and tachycardia today.  Of note, he had COVID within the last 90 days so we will not retest at this time.  10:40  PM Patient's labs show leukocytosis of 21 which is higher than has been in the past.  Creatinine is similar at 1.54.  Lactic acid not elevated, doubt sepsis at this time however with his tachycardia, fever at home, leukocytosis, and clear urinary tract infection I am concerned about him worsening.  His CT scan does not show evidence of new obstruction or kidney stone and the hydronephrosis appears similar to prior.  We will treat for recurrent pyelonephritis.  Will call pharmacy for best antibiotic choice but will call for admission for further management as he has failed outpatient antibiotics this week and is feeling worse.  Spoke to pharmacy who recommended meropenem.  Patient will be admitted to medicine service for further management of recurrent pyelonephritis.        Final Clinical Impression(s) / ED Diagnoses Final diagnoses:  Pyelonephritis  Right flank pain     Clinical Impression: 1. Pyelonephritis   2. Right flank pain     Disposition: Admit  This note was prepared with assistance of Dragon voice recognition software. Occasional wrong-word or sound-a-like substitutions may have occurred due to the inherent limitations of voice recognition software.     Madisen Ludvigsen, Gwenyth Allegra, MD 03/16/21 2306

## 2021-03-17 ENCOUNTER — Encounter (HOSPITAL_COMMUNITY): Payer: Self-pay | Admitting: Internal Medicine

## 2021-03-17 ENCOUNTER — Encounter: Payer: Self-pay | Admitting: Internal Medicine

## 2021-03-17 DIAGNOSIS — R10A1 Flank pain, right side: Secondary | ICD-10-CM

## 2021-03-17 DIAGNOSIS — N183 Chronic kidney disease, stage 3 unspecified: Secondary | ICD-10-CM

## 2021-03-17 DIAGNOSIS — E785 Hyperlipidemia, unspecified: Secondary | ICD-10-CM | POA: Diagnosis present

## 2021-03-17 DIAGNOSIS — Z91013 Allergy to seafood: Secondary | ICD-10-CM | POA: Diagnosis not present

## 2021-03-17 DIAGNOSIS — F419 Anxiety disorder, unspecified: Secondary | ICD-10-CM | POA: Diagnosis present

## 2021-03-17 DIAGNOSIS — N12 Tubulo-interstitial nephritis, not specified as acute or chronic: Secondary | ICD-10-CM

## 2021-03-17 DIAGNOSIS — Z803 Family history of malignant neoplasm of breast: Secondary | ICD-10-CM | POA: Diagnosis not present

## 2021-03-17 DIAGNOSIS — Z823 Family history of stroke: Secondary | ICD-10-CM | POA: Diagnosis not present

## 2021-03-17 DIAGNOSIS — Z79899 Other long term (current) drug therapy: Secondary | ICD-10-CM | POA: Diagnosis not present

## 2021-03-17 DIAGNOSIS — Z8616 Personal history of COVID-19: Secondary | ICD-10-CM | POA: Diagnosis not present

## 2021-03-17 DIAGNOSIS — B964 Proteus (mirabilis) (morganii) as the cause of diseases classified elsewhere: Secondary | ICD-10-CM | POA: Diagnosis present

## 2021-03-17 DIAGNOSIS — N1831 Chronic kidney disease, stage 3a: Secondary | ICD-10-CM

## 2021-03-17 DIAGNOSIS — Z8546 Personal history of malignant neoplasm of prostate: Secondary | ICD-10-CM | POA: Diagnosis not present

## 2021-03-17 DIAGNOSIS — Z906 Acquired absence of other parts of urinary tract: Secondary | ICD-10-CM | POA: Diagnosis not present

## 2021-03-17 DIAGNOSIS — N182 Chronic kidney disease, stage 2 (mild): Secondary | ICD-10-CM | POA: Diagnosis present

## 2021-03-17 DIAGNOSIS — Z8249 Family history of ischemic heart disease and other diseases of the circulatory system: Secondary | ICD-10-CM | POA: Diagnosis not present

## 2021-03-17 DIAGNOSIS — E860 Dehydration: Secondary | ICD-10-CM | POA: Diagnosis present

## 2021-03-17 DIAGNOSIS — Z888 Allergy status to other drugs, medicaments and biological substances status: Secondary | ICD-10-CM | POA: Diagnosis not present

## 2021-03-17 DIAGNOSIS — Z85820 Personal history of malignant melanoma of skin: Secondary | ICD-10-CM | POA: Diagnosis not present

## 2021-03-17 DIAGNOSIS — F32A Depression, unspecified: Secondary | ICD-10-CM | POA: Diagnosis present

## 2021-03-17 DIAGNOSIS — D72829 Elevated white blood cell count, unspecified: Secondary | ICD-10-CM

## 2021-03-17 DIAGNOSIS — R109 Unspecified abdominal pain: Secondary | ICD-10-CM

## 2021-03-17 DIAGNOSIS — A4159 Other Gram-negative sepsis: Secondary | ICD-10-CM | POA: Diagnosis present

## 2021-03-17 DIAGNOSIS — Z91041 Radiographic dye allergy status: Secondary | ICD-10-CM | POA: Diagnosis not present

## 2021-03-17 DIAGNOSIS — N136 Pyonephrosis: Secondary | ICD-10-CM | POA: Diagnosis present

## 2021-03-17 DIAGNOSIS — K219 Gastro-esophageal reflux disease without esophagitis: Secondary | ICD-10-CM | POA: Diagnosis present

## 2021-03-17 DIAGNOSIS — Z8551 Personal history of malignant neoplasm of bladder: Secondary | ICD-10-CM | POA: Diagnosis not present

## 2021-03-17 DIAGNOSIS — N170 Acute kidney failure with tubular necrosis: Secondary | ICD-10-CM | POA: Diagnosis present

## 2021-03-17 DIAGNOSIS — Z20822 Contact with and (suspected) exposure to covid-19: Secondary | ICD-10-CM | POA: Diagnosis present

## 2021-03-17 DIAGNOSIS — J150 Pneumonia due to Klebsiella pneumoniae: Secondary | ICD-10-CM | POA: Diagnosis present

## 2021-03-17 LAB — BLOOD CULTURE ID PANEL (REFLEXED) - BCID2

## 2021-03-17 LAB — CBC
HCT: 37.8 % — ABNORMAL LOW (ref 39.0–52.0)
Hemoglobin: 12.4 g/dL — ABNORMAL LOW (ref 13.0–17.0)
MCH: 30.6 pg (ref 26.0–34.0)
MCHC: 32.8 g/dL (ref 30.0–36.0)
MCV: 93.3 fL (ref 80.0–100.0)
Platelets: 332 10*3/uL (ref 150–400)
RBC: 4.05 MIL/uL — ABNORMAL LOW (ref 4.22–5.81)
RDW: 13.2 % (ref 11.5–15.5)
WBC: 19.3 10*3/uL — ABNORMAL HIGH (ref 4.0–10.5)
nRBC: 0 % (ref 0.0–0.2)

## 2021-03-17 LAB — RESP PANEL BY RT-PCR (FLU A&B, COVID) ARPGX2
Influenza A by PCR: NEGATIVE
Influenza B by PCR: NEGATIVE
SARS Coronavirus 2 by RT PCR: NEGATIVE

## 2021-03-17 LAB — BASIC METABOLIC PANEL
Anion gap: 7 (ref 5–15)
BUN: 28 mg/dL — ABNORMAL HIGH (ref 8–23)
CO2: 20 mmol/L — ABNORMAL LOW (ref 22–32)
Calcium: 8.8 mg/dL — ABNORMAL LOW (ref 8.9–10.3)
Chloride: 109 mmol/L (ref 98–111)
Creatinine, Ser: 1.52 mg/dL — ABNORMAL HIGH (ref 0.61–1.24)
GFR, Estimated: 46 mL/min — ABNORMAL LOW (ref >60)
Glucose, Bld: 154 mg/dL — ABNORMAL HIGH (ref 70–99)
Potassium: 3.7 mmol/L (ref 3.5–5.1)
Sodium: 136 mmol/L (ref 135–145)

## 2021-03-17 MED ORDER — MOMETASONE FURO-FORMOTEROL FUM 200-5 MCG/ACT IN AERO
2.0000 | INHALATION_SPRAY | Freq: Two times a day (BID) | RESPIRATORY_TRACT | Status: DC
  Administered 2021-03-17 – 2021-03-19 (×5): 2 via RESPIRATORY_TRACT
  Filled 2021-03-17: qty 8.8

## 2021-03-17 MED ORDER — ACETAMINOPHEN 650 MG RE SUPP: 650.0000 mg | Freq: Four times a day (QID) | RECTAL | Status: DC | PRN

## 2021-03-17 MED ORDER — ONDANSETRON HCL 4 MG/2ML IJ SOLN: 4.0000 mg | Freq: Four times a day (QID) | INTRAMUSCULAR | Status: DC | PRN

## 2021-03-17 MED ORDER — VENLAFAXINE HCL 37.5 MG PO TABS
112.5000 mg | ORAL_TABLET | Freq: Every day | ORAL | Status: DC
  Administered 2021-03-17 – 2021-03-18 (×2): 112.5 mg via ORAL
  Filled 2021-03-17 (×3): qty 1

## 2021-03-17 MED ORDER — SODIUM CHLORIDE 0.9 % IV SOLN
1.0000 g | Freq: Two times a day (BID) | INTRAVENOUS | Status: DC
  Administered 2021-03-17 – 2021-03-19 (×5): 1 g via INTRAVENOUS
  Filled 2021-03-17 (×6): qty 1

## 2021-03-17 MED ORDER — LACTATED RINGERS IV SOLN: INTRAVENOUS | Status: DC

## 2021-03-17 MED ORDER — ONDANSETRON HCL 4 MG PO TABS: 4.0000 mg | ORAL_TABLET | Freq: Four times a day (QID) | ORAL | Status: DC | PRN

## 2021-03-17 MED ORDER — LATANOPROST 0.005 % OP SOLN
1.0000 [drp] | Freq: Every day | OPHTHALMIC | Status: DC
  Administered 2021-03-17 – 2021-03-18 (×2): 1 [drp] via OPHTHALMIC
  Filled 2021-03-17: qty 2.5

## 2021-03-17 MED ORDER — ALBUTEROL SULFATE (2.5 MG/3ML) 0.083% IN NEBU: 3.0000 mL | INHALATION_SOLUTION | RESPIRATORY_TRACT | Status: DC | PRN

## 2021-03-17 MED ORDER — ENOXAPARIN SODIUM 40 MG/0.4ML IJ SOSY
40.0000 mg | PREFILLED_SYRINGE | INTRAMUSCULAR | Status: DC
  Administered 2021-03-17 – 2021-03-19 (×3): 40 mg via SUBCUTANEOUS
  Filled 2021-03-17 (×3): qty 0.4

## 2021-03-17 MED ORDER — ACETAMINOPHEN 500 MG PO TABS
1000.0000 mg | ORAL_TABLET | Freq: Once | ORAL | Status: AC
  Administered 2021-03-17: 1000 mg via ORAL
  Filled 2021-03-17: qty 2

## 2021-03-17 MED ORDER — HYDROCODONE-ACETAMINOPHEN 5-325 MG PO TABS: 1.0000 | ORAL_TABLET | Freq: Four times a day (QID) | ORAL | Status: DC | PRN

## 2021-03-17 MED ORDER — ACETAMINOPHEN 325 MG PO TABS
650.0000 mg | ORAL_TABLET | Freq: Four times a day (QID) | ORAL | Status: DC | PRN
  Administered 2021-03-17 – 2021-03-19 (×7): 650 mg via ORAL
  Filled 2021-03-17 (×6): qty 2

## 2021-03-17 MED ORDER — FLUTICASONE-SALMETEROL 232-14 MCG/ACT IN AEPB: 1.0000 | INHALATION_SPRAY | Freq: Every day | RESPIRATORY_TRACT | Status: DC

## 2021-03-17 NOTE — Consult Note (Signed)
[facility] for Infectious Disease       Reason for Consult: pyelonephritis    Referring Physician: Dr. Nevada Crane  Principal Problem:   Pyelonephritis Active Problems:   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HCC)   History of bladder cancer   History of ESBL E. coli infection   Hydronephrosis   Right flank pain   CKD (chronic kidney disease) stage 3, GFR 30-59 ml/min (HCC)   Leukocytosis    enoxaparin (LOVENOX) injection  40 mg Subcutaneous Q24H   latanoprost  1 drop Right Eye QHS   mometasone-formoterol  2 puff Inhalation BID   venlafaxine  112.5 mg Oral Daily    Recommendations:  Continue meropenem for 3 days followed by fosfomycin x 1 dose after discharge  Assessment: He has UTI and some systemic signs with leukocytosis but no fever.  He is much improved.  Urine culture with ESBL.  Can treat as above.   For future infections, a dose of fosfomycin may be effective and can be repeated after 3 days.  For continued fever or other concerns, he will need to be seen in an urgent care/ED.   I had a long discussion with him and his wife, who voiced her frustration at recurrent infections and not understanding why. I discussed the ileo conduit and risk of infection and all questions answered.    I will sign off, thanks for consultation.   Antibiotics: meropenem  HPI: Adrian Rios is a 81 y.o. male with a history of bladder cancer s/p cystectomy and ileal conduit done in April 2018 here with pyelonephritis.  He had issues with hydronephrosis and obstruction with nephrostomy tube placement in September 2018  and had an ileal conduit stricture repair with infection at that time then bilateral distal ureterectomy and redo utero-enteric anastomoses in October 2018.  He has had about 3 years of little issues with infection after that until recently. In September he developed high fever and flank pain and UA with leukocytes and nitrites and culture with Klebsiella resistant only to ampicillin and treated  with IV antibiotics for 3 days followed by cephalexin.  In November he presented to the ED with flank pain and concern for infection and treated with cephalexin again and did well after that.  His culture at that time grew ESBL E coli.  Prior to this infection, he was given a dose of macrobid by his PCP which helped some but infectious symptoms returned.   He is here again with what appears to be pyelonephritis and ID consult requested to discuss ways to prevent further episodes.  Wife wanting answers as to why he keeps getting infections.  He reportedly had fever but Tmax here only 99.8.  WBC elevated.    Review of Systems:  Constitutional: negative for fevers and chills Gastrointestinal: negative for nausea and diarrhea Integument/breast: negative for rash All other systems reviewed and are negative    Past Medical History:  Diagnosis Date   Allergic rhinitis    Arthritis    Benign localized prostatic hyperplasia with lower urinary tract symptoms (LUTS)    Bladder tumor    Borderline glaucoma of right eye    Cancer ([place])    bladder and prostate   Carotid bruit    per duplex 03-11-2016 RICA 1-39%   Chronic throat clearing    COVID-19    DDD (degenerative disc disease), lumbar    Depression    ED (erectile dysfunction)    Elevated PSA    urologist-  dr  tannenbaum--- s/p  prostate bx's   GERD (gastroesophageal reflux disease)    Hematuria    History of chronic bronchitis    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    s/p  MOH's nasal --  pre-melanoma   History of squamous cell carcinoma excision    several times   Hyperlipidemia    Pre-diabetes    Premature ventricular contractions (PVCs) (VPCs)    RAD (reactive airway disease)    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  s/p  deep neck lymph node bx's and lung bx's   Sensorineural hearing loss (SNHL) of both ears    Sepsis ([place]) 09/2016   UTI (urinary tract infection) 08/2016   Wears glasses    Wears  hearing aid    bilateral    Social History   Tobacco Use   Smoking status: Never   Smokeless tobacco: Never  Vaping Use   Vaping Use: Never used  Substance Use Topics   Alcohol use: No   Drug use: No    Family History  Problem Relation Age of Onset   Stroke Mother        in her 53s   Breast cancer Sister    Heart disease Maternal Grandmother 83       MI    Breast cancer Paternal Grandmother    Heart disease Paternal Grandfather 97       MI   Stroke Brother    Healthy Father    Diabetes Neg Hx     Allergies  Allergen Reactions   Ivp Dye [Iodinated Contrast Media] Shortness Of Breath, Itching and Palpitations    "eyes itching,  Heart racing,  Effected breathing" 10-27-16 pt with 13 hr pre-meds for CT without any reaction-kj   Metrizamide Shortness Of Breath, Itching and Palpitations     "eyes itching,  Heart racing,  Effected breathing" 10-27-16 pt with 13 hr pre-meds for CT without any reaction-kj   Shellfish Allergy Hives, Shortness Of Breath and Itching    Mostly crab   Gabapentin Other (See Comments)    REACTION: dizziness and flushing   Niacin-Lovastatin Er Other (See Comments)    Did not feel good on medication   Gadolinium Hives   Adhesive [Tape] Rash    Use paper tape    Physical Exam: Constitutional: in no apparent distress  Vitals:   03/17/21 0658 03/17/21 1034  BP: 117/77 128/74  Pulse: 98 (!) 107  Resp: 20 18  Temp: 98.8 F (37.1 C) 99.1 F (37.3 C)  SpO2: 95% 93%   EYES: anicteric Cardiovascular: Cor RRR Respiratory: clear; Musculoskeletal: no pedal edema noted Skin: negatives: no rash Neuro: non-focal  Lab Results  Component Value Date   WBC 19.3 (H) 03/17/2021   HGB 12.4 (L) 03/17/2021   HCT 37.8 (L) 03/17/2021   MCV 93.3 03/17/2021   PLT 332 03/17/2021    Lab Results  Component Value Date   CREATININE 1.52 (H) 03/17/2021   BUN 28 (H) 03/17/2021   NA 136 03/17/2021   K 3.7 03/17/2021   CL 109 03/17/2021   CO2 20 (L)  03/17/2021    Lab Results  Component Value Date   ALT 73 (H) 03/16/2021   AST 51 (H) 03/16/2021   ALKPHOS 141 (H) 03/16/2021     Microbiology: Recent Results (from the past 240 hour(s))  Resp Panel by RT-PCR (Flu A&B, Covid) Nasopharyngeal Swab     Status: None   Collection Time: 03/17/21  9:34 AM   Specimen: Nasopharyngeal Swab; Nasopharyngeal(NP) swabs in vial transport medium  Result Value Ref Range Status   SARS Coronavirus 2 by RT PCR NEGATIVE NEGATIVE Final    Comment: (NOTE) SARS-CoV-2 target nucleic acids are NOT DETECTED.  The SARS-CoV-2 RNA is generally detectable in upper respiratory specimens during the acute phase of infection. The lowest concentration of SARS-CoV-2 viral copies this assay can detect is 138 copies/mL. A negative result does not preclude SARS-Cov-2 infection and should not be used as the sole basis for treatment or other patient management decisions. A negative result may occur with  improper specimen collection/handling, submission of specimen other than nasopharyngeal swab, presence of viral mutation(s) within the areas targeted by this assay, and inadequate number of viral copies(<138 copies/mL). A negative result must be combined with clinical observations, patient history, and epidemiological information. The expected result is Negative.  Fact Sheet for Patients:  EntrepreneurPulse.com.au  Fact Sheet for Healthcare Providers:  IncredibleEmployment.be  This test is no t yet approved or cleared by the [place] FDA and  has been authorized for detection and/or diagnosis of SARS-CoV-2 by FDA under an Emergency Use Authorization (EUA). This EUA will remain  in effect (meaning this test can be used) for the duration of the COVID-19 declaration under Section 564(b)(1) of the Act, 21 U.S.C.section 360bbb-3(b)(1), unless the authorization is terminated  or revoked sooner.       Influenza A by PCR NEGATIVE  NEGATIVE Final   Influenza B by PCR NEGATIVE NEGATIVE Final    Comment: (NOTE) The Xpert Xpress SARS-CoV-2/FLU/RSV plus assay is intended as an aid in the diagnosis of influenza from Nasopharyngeal swab specimens and should not be used as a sole basis for treatment. Nasal washings and aspirates are unacceptable for Xpert Xpress SARS-CoV-2/FLU/RSV testing.  Fact Sheet for Patients: EntrepreneurPulse.com.au  Fact Sheet for Healthcare Providers: IncredibleEmployment.be  This test is not yet approved or cleared by the [place] FDA and has been authorized for detection and/or diagnosis of SARS-CoV-2 by FDA under an Emergency Use Authorization (EUA). This EUA will remain in effect (meaning this test can be used) for the duration of the COVID-19 declaration under Section 564(b)(1) of the Act, 21 U.S.C. section 360bbb-3(b)(1), unless the authorization is terminated or revoked.  Performed at [facility], [place] [address], [place], [place] [address]     Zyair Russi W Oneal Biglow, MD [facility] [employer] [email or web address] 03/17/2021, 1:48 PM

## 2021-03-17 NOTE — ED Notes (Signed)
Report given to [facility]

## 2021-03-17 NOTE — ED Notes (Addendum)
Report given to [place] at [facility] Wife notified on room number @ [facility]

## 2021-03-17 NOTE — H&P (Signed)
History and Physical    Adrian Rios:[id number] DOB: 12-06-1940 DOA: 03/16/2021  PCP: [name], MD   Patient coming from: Home  Chief Complaint: Right flank pain  HPI: Adrian Rios is a 81 y.o. male with medical history significant for bladder cancer s/p cystectomy with ileal conduit in April 2018, hx of recurrent UTI/pyelonephritis, sarcoidosis, chronic bilateral hydronephrosis, hx of ESBL UTI who presents with fever and right flank pain.  He was treated by his primary physician last week for urinary tract infection that was diagnosed on January 5.  Urine culture at that time showed ESBL E. coli sensitive to nitrofurantoin and he was prescribed nitrofurantoin which he completed a few days ago.  Within 2 days he developed fever again to 101.9 degrees with chills and worsening right flank pain.  He has not had nausea or vomiting.  He has not had rash.  With fever and worsening symptoms he came back to the emergency room.  CT scan showed no obstructing stones with chronic stable hydronephrosis and clinically has pyelonephritis on the right.  ED Course: Right to be hemodynamically stable in the emergency room and with transport to [facility].  He was started on meropenem in the emergency room which will be continued.  Antibiotic action was discussed with pharmacy by the ER prior to initiating it.  Blood work reveals WBC of 21,000 hemoglobin 12.9 hematocrit 38.3 platelets 362,000, lactic acid 0.9 sodium 134 potassium 3.7 chloride 106 bicarb 19 creatinine 1.54 BUN 33 glucose 118 alk phosphatase 141 albumin 3.2 AST 51 ALT 73 lipase 28 bilirubin 1.0 COVID pending.  Hospitalist service was asked to admit for further management  Review of Systems:  General: Reports fever, chills. Denies weight loss, night sweats.  Denies dizziness.  Denies change in appetite HENT: Denies head trauma, headache, denies tinnitus.  Denies nasal bleeding. Denies sore throat Eyes: Denies blurry vision, pain in  eye, drainage.  Denies discoloration of eyes. Neck: Denies pain.  Denies swelling.  Denies pain with movement. Cardiovascular: Denies chest pain, palpitations. Denies edema.  Denies orthopnea Respiratory: Denies shortness of breath, cough. Denies wheezing.  Denies sputum production Gastrointestinal: Denies abdominal pain, swelling. Denies nausea, vomiting, diarrhea. Denies melena.  Denies hematemesis. Musculoskeletal: Denies limitation of movement.  Denies deformity or swelling.   Genitourinary: Denies pelvic pain.  Denies dysuria. Reports right flank pain Skin: Denies rash.  Denies petechiae, purpura, ecchymosis. Neurological: Denies syncope.Denies seizure activity. Denies slurred speech, drooping face.Denies visual change. Psychiatric: Denies depression, anxiety.   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    s/p  MOH's nasal --  pre-melanoma   History of squamous cell carcinoma excision    several times   Hyperlipidemia    Pre-diabetes    Premature ventricular contractions (PVCs) (  VPCs)    RAD (reactive airway disease)    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  s/p  deep neck lymph node bx's and lung bx's   Sensorineural hearing loss (SNHL) of both ears    Sepsis ([place]) 09/2016   UTI (urinary tract infection) 08/2016   Wears glasses    Wears hearing aid    bilateral    Past  Surgical History:  Procedure Laterality Date   APPENDECTOMY  2008   CARDIOVASCULAR STRESS TEST  05/27/2010   normal nuclear study w/ no ischemia/  normal LV function and wall motion , ef 60%   CYSTOSCOPY WITH INJECTION N/A 06/12/2016   Procedure: CYSTOSCOPY WITH INJECTION OF INDOCYANINE GREEN DYE;  Surgeon: Alexis Frock, MD;  Location: [facility];  Service: Urology;  Laterality: N/A;   DEEP NECK LYMPH NODE BIOPSY / EXCISION  1970's   and Bronchoscopy w/ bx's ( dx Sarcoidosis)   ILEOSTOMY     IR NEPHROSTOMY PLACEMENT LEFT  11/18/2016   MOHS SURGERY  2013 approx.    nasal; pre melanoma   PARS PLANA VITRECTOMY Right 2007   repair macular pucker   TONSILLECTOMY AND ADENOIDECTOMY  child   TRANSURETHRAL RESECTION OF BLADDER TUMOR N/A 04/06/2016   Procedure: CYSTOSCOPY TRANSURETHRAL RESECTION OF BLADDER TUMORS (TURBT);  Surgeon: Carolan Clines, MD;  [name];  Service: Urology;  Laterality: N/A;   TRANSURETHRAL RESECTION OF PROSTATE      Social History  reports that he has never smoked. He has never used smokeless tobacco. He reports that he does not drink alcohol and does not use drugs.  Allergies  Allergen Reactions   Ivp Dye [Iodinated Contrast Media] Shortness Of Breath, Itching and Palpitations    "eyes itching,  Heart racing,  Effected breathing" 10-27-16 pt with 13 hr pre-meds for CT without any reaction-kj   Metrizamide Itching, Palpitations and Shortness Of Breath    "eyes itching,  Heart racing,  Effected breathing" "eyes itching,  Heart racing,  Effected breathing" 10-27-16 pt with 13 hr pre-meds for CT without any reaction-kj   Shellfish Allergy Hives, Shortness Of Breath and Itching    Mostly crab   Gabapentin Other (See Comments)    REACTION: dizziness and flushing   Niacin-Lovastatin Er Other (See Comments)    Did not feel good on medication   Gadolinium Hives   Adhesive [Tape] Rash    Use paper tape   Other Rash    Use paper tape     Family History  Problem Relation Age of Onset   Stroke Mother        in her 60s   Breast cancer Sister    Heart disease Maternal Grandmother 75       MI    Breast cancer Paternal Grandmother    Heart disease Paternal Grandfather 50       MI   Stroke Brother    Healthy Father    Diabetes Neg Hx      Prior to Admission medications   Medication Sig Start Date End Date Taking? Authorizing Provider  acetaminophen (TYLENOL) 500 MG tablet Take 500 mg by mouth every 6 (six) hours as needed.    [provider]  albuterol (VENTOLIN HFA) 108 (90 Base) MCG/ACT inhaler Inhale 2 puffs into the lungs every 6 (six) hours as needed for wheezing or shortness of breath. 02/01/20   [name], MD  cephALEXin (KEFLEX) 500 MG capsule Take  1 capsule (500 mg total) by mouth 4 (four) times daily. Patient not taking: Reported on 02/13/2021 01/05/21   Fredia Sorrow, MD  Fluticasone-Salmeterol [phone] MCG/ACT AEPB Inhale 1 puff into the lungs in the morning and at bedtime. 12/22/20   Burns, Claudina Lick, MD  latanoprost (XALATAN) 0.005 % ophthalmic solution Place 1 drop into the right eye at bedtime. 05/06/20   [provider]  magnesium oxide (MAG-OX) 400 MG tablet Take 400 mg by mouth daily.    [provider]  Misc Natural Products (GLUCOSAMINE CHOND DOUBLE STR PO) Take 1 tablet by mouth 2 (two) times daily.    [provider]  montelukast (SINGULAIR) 10 MG tablet Take 1 tablet (10 mg total) by mouth at bedtime. 01/27/21   [name], MD  nitrofurantoin, macrocrystal-monohydrate, (MACROBID) 100 MG capsule Take 1 capsule (100 mg total) by mouth 2 (two) times daily. 03/06/21   [name], MD  triamcinolone (NASACORT) 55 MCG/ACT AERO nasal inhaler Place 1 spray into the nose at bedtime.    [provider]  venlafaxine (EFFEXOR) 75 MG tablet Take 1.5 tablets (112.5 mg total) by mouth daily. 01/08/21   [name], MD    Physical Exam: Vitals:   03/17/21  0000 03/17/21 0128 03/17/21 0229 03/17/21 0231  BP: 128/64   130/77  Pulse: 88   (!) 113  Resp:    20  Temp:  99.8 F (37.7 C) 99.8 F (37.7 C) 99.8 F (37.7 C)  TempSrc:  Oral Oral Oral  SpO2: 98%   95%  Weight:   78.3 kg   Height:   _0  (1.702 m)     Constitutional: NAD, calm, comfortable Vitals:   03/17/21 0000 03/17/21 0128 03/17/21 0229 03/17/21 0231  BP: 128/64   130/77  Pulse: 88   (!) 113  Resp:    20  Temp:  99.8 F (37.7 C) 99.8 F (37.7 C) 99.8 F (37.7 C)  TempSrc:  Oral Oral Oral  SpO2: 98%   95%  Weight:   78.3 kg   Height:   _1  (1.702 m)    General: WDWN, Alert and oriented x3.  Eyes: EOMI, PERRL, conjunctivae normal.  Sclera nonicteric HENT:  [place]/AT, external ears normal. Nares patent without epistasis.  Mucous membranes are moist Neck: Soft, normal range of motion, supple, no masses,Trachea midline Respiratory: clear to auscultation bilaterally, no wheezing, no crackles. Normal respiratory effort.  Cardiovascular:  Regular rate and rhythm. No murmur, rub or gallop. No peripheral edema.  Abdomen: Soft, no tenderness, nondistended, no rebound or guarding.  No masses palpated. Bowel sounds normoactive. Right flank pain to palpation. Urostomy in RLQ. No erythema around urostomy Musculoskeletal: FROM. no cyanosis. No joint deformity upper and lower extremities. Normal muscle tone.  Skin: Warm, dry, intact no rashes, lesions, ulcers. No induration Neurologic: CN 2-12 grossly intact.  Normal speech.  Sensation intact to touch Psychiatric: Normal judgment and insight. Normal mood.    Labs on Admission: I have personally reviewed following labs and imaging studies  CBC: Recent Labs  Lab 03/16/21 2053  WBC 21.0*  NEUTROABS 17.9*  HGB 12.9*  HCT 38.3*  MCV 90.8  PLT 191    Basic Metabolic Panel: Recent Labs  Lab 03/16/21 2053  NA 134*  K 3.7  CL 106  CO2 19*  GLUCOSE 118*  BUN 33*  CREATININE 1.54*  CALCIUM 8.8*    GFR: Estimated  Creatinine Clearance: 35.8 mL/min (A) (by C-G formula based on  SCr of 1.54 mg/dL (H)).  Liver Function Tests: Recent Labs  Lab 03/16/21 2053  AST 51*  ALT 73*  ALKPHOS 141*  BILITOT 1.0  PROT 7.1  ALBUMIN 3.2*    Urine analysis:    Component Value Date/Time   COLORURINE YELLOW 03/16/2021 1846   APPEARANCEUR CLOUDY (A) 03/16/2021 1846   LABSPEC 1.015 03/16/2021 1846   PHURINE 6.5 03/16/2021 1846   GLUCOSEU NEGATIVE 03/16/2021 1846   GLUCOSEU NEGATIVE 03/06/2021 1448   HGBUR SMALL (A) 03/16/2021 1846   HGBUR negative 07/11/2009 0813   BILIRUBINUR NEGATIVE 03/16/2021 1846   KETONESUR NEGATIVE 03/16/2021 1846   PROTEINUR 30 (A) 03/16/2021 1846   UROBILINOGEN 0.2 03/06/2021 1448   NITRITE POSITIVE (A) 03/16/2021 1846   LEUKOCYTESUR LARGE (A) 03/16/2021 1846    Radiological Exams on Admission: CT Renal Stone Study  Result Date: 03/16/2021 CLINICAL DATA:  Right flank pain. EXAM: CT ABDOMEN AND PELVIS WITHOUT CONTRAST TECHNIQUE: Multidetector CT imaging of the abdomen and pelvis was performed following the standard protocol without IV contrast. RADIATION DOSE REDUCTION: This exam was performed according to the departmental dose-optimization program which includes automated exposure control, adjustment of the mA and/or kV according to patient size and/or use of iterative reconstruction technique. COMPARISON:  January 05, 2021 FINDINGS: Lower chest: No acute abnormality. Hepatobiliary: No focal liver abnormality is seen. No gallstones, gallbladder wall thickening, or biliary dilatation. Pancreas: Unremarkable. No pancreatic ductal dilatation or surrounding inflammatory changes. Spleen: Normal in size without focal abnormality. Adrenals/Urinary Tract: Adrenal glands are unremarkable. Kidneys are normal in size. A 3.2 cm cyst is seen along the anterior aspect of the mid to lower left kidney. A 2.1 cm exophytic cyst is seen along the posterior aspect of the mid to lower right kidney. 2 mm and  4 mm nonobstructing renal calculi are seen within the mid and lower right kidney. Moderate to marked severity bilateral hydronephrosis and hydroureter are noted, without evidence of obstructing renal calculi. The urinary bladder is surgically absent. Stomach/Bowel: Stomach is within normal limits. The appendix is surgically absent. Surgically anastomosed bowel is seen within the right lower quadrant. No evidence of bowel dilatation. Noninflamed diverticula are seen throughout the sigmoid colon. Vascular/Lymphatic: Aortic atherosclerosis. No enlarged abdominal or pelvic lymph nodes. Reproductive: The prostate gland is surgically absent. Other: A right lower quadrant ostomy site is seen. No abdominopelvic ascites. Musculoskeletal: No acute or significant osseous findings. IMPRESSION: 1. Findings consistent with transurethral resection of the urinary bladder, with predominant stable moderate to marked severity bilateral hydronephrosis and hydroureter, in the absence of obstructing renal calculi. 2. Subcentimeter nonobstructing right renal calculi. 3. Sigmoid diverticulosis. Aortic Atherosclerosis (ICD10-I70.0). Electronically Signed   By: Virgina Norfolk M.D.   On: 03/16/2021 21:50    Assessment/Plan Principal Problem:   Pyelonephritis Mr. Olmeda is admitted to medical floor.  He is started on meropenem in the emergency room after consultation with pharmacy.  Meropenem will be continued.  He has history of ESBL E. coli and Klebsiella infections in the past.  Microbiology reports are reviewed.  He recently finished a nitrofurantoin antibiotic course as an outpatient but symptoms recurred within 1 to 2 days of stopping that antibiotic course IV fluid hydration with LR at 100 ml/hr Urine culture was obtained in the emergency room and will be monitored.  Active Problems:   Hydronephrosis Patient with chronic hydronephrosis that does not appear to be worsening on CT images per radiology    CKD (chronic kidney  disease) stage 3, GFR 30-59 ml/min  Stable.  Recheck electrolytes and renal function in morning with labs    Leukocytosis WBC is elevated at 21,000.  Recheck CBC in morning.  Patient has been started on antibiotic therapy as above    Right flank pain Secondary to pyelonephritis. Pain medication provided as needed    S/P ileal conduit    History of bladder cancer   History of ESBL E. coli infection   DVT prophylaxis: Lovenox for DVT prophylaxis.   Code Status:   Full Code  Family Communication:  Diagnosis and plan discussed with patient.  Patient verbalized understanding and agrees with plan.  Questions answered.  Further recommendations to follow as clinically indicated Disposition Plan:   Patient is from:  Home  Anticipated DC to:  Home  Anticipated DC date:  Anticipate 2 midnight or more stay to treat acute condition  Time Spent:   75 minutes which includes evaluating patient, examining patient, writing orders, reviewing labs and previous microbiology reports, and radiology studies  Admission status:  Inpatient  Yevonne Aline Ivori Storr MD [facility]  How to contact the [facility] Attending or Consulting provider [place] or covering provider during after hours 7P -7A, for this patient?   Check the care team in [facility] and look for a) attending/consulting [facility] provider listed and b) the [facility] team listed Log into [email or web address] and use 's universal password to access. If you do not have the password, please contact the hospital operator. Locate the [facility] provider you are looking for under [facility] and page to a number that you can be directly reached. If you still have difficulty reaching the provider, please page the [facility] (Director on Call) for the Hospitalists listed on amion for assistance.  03/17/2021, 3:28 AM

## 2021-03-17 NOTE — Progress Notes (Addendum)
Arrived from [facility] med center. VS stable. C/O HA pain and Rt flank pain 3/10. Stated was given Tylenol before transfer and pain has decreased. Has Ileostomy bag RLQ w/ clear yellow urine . A & O to person, place,time,situation. Denies chest pain, dizziness, shortness of breath.  Dr Tonie Griffith at bedside to assess.

## 2021-03-17 NOTE — Plan of Care (Signed)
  Problem: Education: Goal: Knowledge of General Education information will improve Description Including pain rating scale, medication(s)/side effects and non-pharmacologic comfort measures Outcome: Progressing   

## 2021-03-17 NOTE — Progress Notes (Signed)
Pharmacy Antibiotic Note  Adrian Rios is a 81 y.o. male admitted on 03/16/2021 with UTI.  Pharmacy has been consulted for Merrem dosing. Recent ESBL E Coli UTI. WBC elevated. Noted renal dysfunction.   Plan: Merrem 1g IV q12h Trend WBC, temp, renal function  F/U cultures    Height: 5\' 7"  (170.2 cm) Weight: 79.4 kg (175 lb) IBW/kg (Calculated) : 66.1  Temp (24hrs), Avg:99.1 F (37.3 C), Min:99.1 F (37.3 C), Max:99.1 F (37.3 C)  Recent Labs  Lab 03/16/21 2053  WBC 21.0*  CREATININE 1.54*  LATICACIDVEN 0.9    Estimated Creatinine Clearance: 38.6 mL/min (A) (by C-G formula based on SCr of 1.54 mg/dL (H)).    Allergies  Allergen Reactions   Ivp Dye [Iodinated Contrast Media] Shortness Of Breath, Itching and Palpitations    "eyes itching,  Heart racing,  Effected breathing" 10-27-16 pt with 13 hr pre-meds for CT without any reaction-kj   Metrizamide Itching, Palpitations and Shortness Of Breath    "eyes itching,  Heart racing,  Effected breathing" "eyes itching,  Heart racing,  Effected breathing" 10-27-16 pt with 13 hr pre-meds for CT without any reaction-kj   Shellfish Allergy Hives, Shortness Of Breath and Itching    Mostly crab   Gabapentin Other (See Comments)    REACTION: dizziness and flushing   Niacin-Lovastatin Er Other (See Comments)    Did not feel good on medication   Gadolinium Hives   Adhesive [Tape] Rash    Use paper tape   Other Rash    Use paper tape   Narda Bonds, PharmD, BCPS Clinical Pharmacist Phone: [phone]

## 2021-03-17 NOTE — Progress Notes (Addendum)
Adrian Rios is a 81 y.o. male with medical history significant for bladder cancer s/p cystectomy with ileal conduit in April 2018, hx of recurrent UTI/pyelonephritis, sarcoidosis, chronic bilateral hydronephrosis, hx of ESBL UTI who presents with fever and right flank pain.  He was treated by his primary physician last week for urinary tract infection that was diagnosed on January 5.  Urine culture at that time showed ESBL E. coli sensitive to nitrofurantoin and he was prescribed nitrofurantoin which he completed a few days ago.  Within 2 days he developed fever again to 101.9 degrees with chills and worsening right flank pain.  He has not had nausea or vomiting.  He has not had rash.  With fever and worsening symptoms he came back to the emergency room.  CT scan showed no obstructing stones with chronic stable hydronephrosis and clinically has pyelonephritis on the right.   ED Course: Right to be hemodynamically stable in the emergency room and with transport to [facility].  He was started on meropenem in the emergency room which will be continued.  Antibiotic action was discussed with pharmacy by the ER prior to initiating it.  Blood work reveals WBC of 21,000 hemoglobin 12.9 hematocrit 38.3 platelets 362,000, lactic acid 0.9 sodium 134 potassium 3.7 chloride 106 bicarb 19 creatinine 1.54 BUN 33 glucose 118 alk phosphatase 141 albumin 3.2 AST 51 ALT 73 lipase 28 bilirubin 1.0 COVID pending.  Hospitalist service was asked to admit for further management.  03/17/21: Patient was seen and examined at bedside.  States he feels a lot better than prior to admission.  Flank pain is improved.  His wife was present at bedside, she requested urology and ID consult.  ID consulted.  Non obstructive kidney stones on CT scan, will hold off on urology consult for now.  Please refer to H&P, dictated by my partner Dr. Tonie Griffith on 03/17/2021 for further details of the assessment and plan.

## 2021-03-17 NOTE — TOC Initial Note (Signed)
Transition of Care [facility]) - Initial/Assessment Note    Patient Details  Name: Adrian Rios MRN: [id number] Date of Birth: 18-Dec-1940  Transition of [facility]) CM/SW Contact:    Leeroy Cha, RN Phone Number: 03/17/2021, 10:08 AM  Clinical Narrative:                  Transition of Care [facility]) Screening Note   Patient Details  Name: Adrian Rios Date of Birth: 1941-02-08   Transition of Care [facility]) CM/SW Contact:    Leeroy Cha, RN Phone Number: 03/17/2021, 10:09 AM    Transition of Care Department (TOC) has reviewed patient and no TOC needs have been identified at this time. We will continue to monitor patient advancement through interdisciplinary progression rounds. If new patient transition needs arise, please place a TOC consult.    Expected Discharge Plan: Home/Self Care Barriers to Discharge: Continued Medical Work up   Patient Goals and CMS Choice Patient states their goals for this hospitalization and ongoing recovery are:: to go home CMS Medicare.gov Compare Post Acute Care list provided to:: Patient    Expected Discharge Plan and Services Expected Discharge Plan: Home/Self Care   Discharge Planning Services: CM Consult   Living arrangements for the past 2 months: Single Family Home                                      Prior Living Arrangements/Services Living arrangements for the past 2 months: Single Family Home Lives with:: Spouse Patient language and need for interpreter reviewed:: Yes Do you feel safe going back to the place where you live?: Yes            Criminal Activity/Legal Involvement Pertinent to Current Situation/Hospitalization: No - Comment as needed  Activities of Daily Living Home Assistive Devices/Equipment: None ADL Screening (condition at time of admission) Patient's cognitive ability adequate to safely complete daily activities?: Yes Is the patient deaf or have difficulty hearing?: Yes Does the patient have  difficulty seeing, even when wearing glasses/contacts?: No Does the patient have difficulty concentrating, remembering, or making decisions?: No Patient able to express need for assistance with ADLs?: Yes Does the patient have difficulty dressing or bathing?: No Independently performs ADLs?: Yes (appropriate for developmental age) Does the patient have difficulty walking or climbing stairs?: No Weakness of Legs: None Weakness of Arms/Hands: None  Permission Sought/Granted                  Emotional Assessment Appearance:: Appears stated age Attitude/Demeanor/Rapport: Engaged Affect (typically observed): Calm Orientation: : Oriented to Place, Oriented to Self, Oriented to  Time, Oriented to Situation Alcohol / Substance Use: Not Applicable Psych Involvement: No (comment)  Admission diagnosis:  Pyelonephritis [N12] Right flank pain [R10.9] Patient Active Problem List   Diagnosis Date Noted   Right flank pain 03/17/2021   CKD (chronic kidney disease) stage 3, GFR 30-59 ml/min ([place]) 03/17/2021   Leukocytosis 03/17/2021   Pyelonephritis 03/16/2021   UTI (urinary tract infection) 11/24/2020   Sepsis ([place]) 11/24/2020   History of ESBL E. coli infection 11/24/2020   Hydronephrosis 11/24/2020   Cough 09/06/2020   Low back pain 08/03/2020   Bilateral leg edema 02/16/2020   Chronic pain of both shoulders 08/04/2019   COVID-19 02/28/2019   Dizziness 06/21/2018   Gait disorder 01/24/2018   Left hip pain 11/11/2017   Abdominal wall hernia 05/26/2017  Recurrent urinary tract infection 11/13/2016   Chronic diastolic CHF (congestive heart failure) ([place]) 11/12/2016   Fatigue 08/19/2016   History of bladder cancer 06/18/2016   History of prostate cancer 06/18/2016   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[place]) 06/12/2016   Carotid bruit 02/17/2016   Prediabetes 08/20/2015   Glaucoma 02/14/2015   Varicose veins of lower extremities with other complications 16/12/9602   Asthma 10/27/2011   Squamous  carcinoma ([place]), 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   PREMATURE VENTRICULAR CONTRACTIONS 04/29/2010   ERECTILE DYSFUNCTION 04/23/2010   [place] DISEASE, LUMBOSACRAL SPINE 02/06/2010   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   Depression 04/12/2008   Allergic rhinitis 04/12/2008   Hyperlipidemia 10/05/2006   Sarcoidosis ([place]) 07/05/2006   PCP:  [name], MD Pharmacy:   [facility] [id number] - [address], [place] [address] [name] [address] Phone: [phone] Fax: [facility] #[id number] - [address], [address] AT [facility] [address] [name] [address] Phone: [phone] Fax: [phone]     Social Determinants of Health ([place]) Interventions    Readmission Risk Interventions No flowsheet data found.

## 2021-03-17 NOTE — Progress Notes (Signed)
PHARMACY - PHYSICIAN COMMUNICATION CRITICAL VALUE ALERT - BLOOD CULTURE IDENTIFICATION (BCID)  Adrian Rios is an 81 y.o. male who presented to [facility] on 03/16/2021 with pyelonephritis with h/o ESBL E. coli and Klebsiella infections in the past.  Urine culture from 1/15 pending.  Assessment:  BCID + for Klebsiella pneumoniae (no carbapenem resistance) in 1 out of 4 bottles    Name of physician (or Provider) Contacted: Clarene Essex, FNP  Current antibiotics: Meropenem  Changes to prescribed antibiotics recommended:  No change in current antibiotics at this time as patient being treated for + ESBL UTI  Results for orders placed or performed during the hospital encounter of 03/16/21  Blood Culture ID Panel (Reflexed) (Collected: 03/16/2021  9:03 PM)  Result Value Ref Range   Enterococcus faecalis NOT DETECTED NOT DETECTED   Enterococcus Faecium NOT DETECTED NOT DETECTED   Listeria monocytogenes NOT DETECTED NOT DETECTED   Staphylococcus species NOT DETECTED NOT DETECTED   Staphylococcus aureus (BCID) NOT DETECTED NOT DETECTED   Staphylococcus epidermidis NOT DETECTED NOT DETECTED   Staphylococcus lugdunensis NOT DETECTED NOT DETECTED   Streptococcus species NOT DETECTED NOT DETECTED   Streptococcus agalactiae NOT DETECTED NOT DETECTED   Streptococcus pneumoniae NOT DETECTED NOT DETECTED   Streptococcus pyogenes NOT DETECTED NOT DETECTED   A.calcoaceticus-baumannii NOT DETECTED NOT DETECTED   Bacteroides fragilis NOT DETECTED NOT DETECTED   Enterobacterales DETECTED (A) NOT DETECTED   Enterobacter cloacae complex NOT DETECTED NOT DETECTED   Escherichia coli NOT DETECTED NOT DETECTED   Klebsiella aerogenes NOT DETECTED NOT DETECTED   Klebsiella oxytoca NOT DETECTED NOT DETECTED   Klebsiella pneumoniae DETECTED (A) NOT DETECTED   Proteus species NOT DETECTED NOT DETECTED   Salmonella species NOT DETECTED NOT DETECTED   Serratia marcescens NOT DETECTED NOT DETECTED   Haemophilus  influenzae NOT DETECTED NOT DETECTED   Neisseria meningitidis NOT DETECTED NOT DETECTED   Pseudomonas aeruginosa NOT DETECTED NOT DETECTED   Stenotrophomonas maltophilia NOT DETECTED NOT DETECTED   Candida albicans NOT DETECTED NOT DETECTED   Candida auris NOT DETECTED NOT DETECTED   Candida glabrata NOT DETECTED NOT DETECTED   Candida krusei NOT DETECTED NOT DETECTED   Candida parapsilosis NOT DETECTED NOT DETECTED   Candida tropicalis NOT DETECTED NOT DETECTED   Cryptococcus neoformans/gattii NOT DETECTED NOT DETECTED   CTX-M ESBL NOT DETECTED NOT DETECTED   Carbapenem resistance IMP NOT DETECTED NOT DETECTED   Carbapenem resistance KPC NOT DETECTED NOT DETECTED   Carbapenem resistance NDM NOT DETECTED NOT DETECTED   Carbapenem resist OXA 48 LIKE NOT DETECTED NOT DETECTED   Carbapenem resistance VIM NOT DETECTED NOT DETECTED    Everette Rank, PharmD 03/17/2021  11:54 PM

## 2021-03-18 LAB — CBC WITH DIFFERENTIAL/PLATELET
Abs Immature Granulocytes: 0.06 10*3/uL (ref 0.00–0.07)
Basophils Absolute: 0 10*3/uL (ref 0.0–0.1)
Basophils Relative: 0 %
Eosinophils Absolute: 0.1 10*3/uL (ref 0.0–0.5)
Eosinophils Relative: 1 %
HCT: 37.9 % — ABNORMAL LOW (ref 39.0–52.0)
Hemoglobin: 12.4 g/dL — ABNORMAL LOW (ref 13.0–17.0)
Immature Granulocytes: 0 %
Lymphocytes Relative: 17 %
Lymphs Abs: 2.4 10*3/uL (ref 0.7–4.0)
MCH: 30.5 pg (ref 26.0–34.0)
MCHC: 32.7 g/dL (ref 30.0–36.0)
MCV: 93.3 fL (ref 80.0–100.0)
Monocytes Absolute: 1 10*3/uL (ref 0.1–1.0)
Monocytes Relative: 7 %
Neutro Abs: 10.7 10*3/uL — ABNORMAL HIGH (ref 1.7–7.7)
Neutrophils Relative %: 75 %
Platelets: 313 10*3/uL (ref 150–400)
RBC: 4.06 MIL/uL — ABNORMAL LOW (ref 4.22–5.81)
RDW: 13.4 % (ref 11.5–15.5)
WBC: 14.2 10*3/uL — ABNORMAL HIGH (ref 4.0–10.5)
nRBC: 0 % (ref 0.0–0.2)

## 2021-03-18 LAB — COMPREHENSIVE METABOLIC PANEL
ALT: 40 U/L (ref 0–44)
AST: 17 U/L (ref 15–41)
Albumin: 2.8 g/dL — ABNORMAL LOW (ref 3.5–5.0)
Alkaline Phosphatase: 144 U/L — ABNORMAL HIGH (ref 38–126)
Anion gap: 5 (ref 5–15)
BUN: 20 mg/dL (ref 8–23)
CO2: 23 mmol/L (ref 22–32)
Calcium: 8.8 mg/dL — ABNORMAL LOW (ref 8.9–10.3)
Chloride: 110 mmol/L (ref 98–111)
Creatinine, Ser: 1.3 mg/dL — ABNORMAL HIGH (ref 0.61–1.24)
GFR, Estimated: 56 mL/min — ABNORMAL LOW (ref >60)
Glucose, Bld: 108 mg/dL — ABNORMAL HIGH (ref 70–99)
Potassium: 3.5 mmol/L (ref 3.5–5.1)
Sodium: 138 mmol/L (ref 135–145)
Total Bilirubin: 0.8 mg/dL (ref 0.3–1.2)
Total Protein: 6.5 g/dL (ref 6.5–8.1)

## 2021-03-18 MED ORDER — LACTATED RINGERS IV SOLN: INTRAVENOUS | Status: DC

## 2021-03-18 NOTE — Consult Note (Signed)
I have been asked to see the patient by Dr. 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 Tresa Moore.  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  He subsequently went to [facility] where he had revision of his ureteral intestinal anastomosis was and as been doing very well over the following 4 years.    The patient was seen in September 2022 at that time was noted to have right-sided pyelonephritis.  He was treated with IV antibiotics and improved.  He was subsequently seen by Dr. Dimas Millin at [facility] and underwent a Lasix renogram.  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  At that time the patient was noted to have stable or slightly worsening hydronephrosis and normal renal function.  He was also seen by infectious disease, their focus was to work on stone prevention and behavioral changes.    Review of systems: A 12 point comprehensive review of systems was obtained and is negative unless otherwise stated in the history of present illness.  Patient Active Problem List   Diagnosis Date Noted   Right flank pain 03/17/2021   CKD (chronic kidney disease) stage 3, GFR 30-59 ml/min (HCC) 03/17/2021   Leukocytosis 03/17/2021   Pyelonephritis 03/16/2021   UTI (urinary tract infection) 11/24/2020   Sepsis ([place]) 11/24/2020   History of ESBL E. coli infection 11/24/2020   Hydronephrosis 11/24/2020   Cough 09/06/2020   Low back pain 08/03/2020   Bilateral leg edema 02/16/2020   Chronic pain of both shoulders 08/04/2019   COVID-19 02/28/2019    Dizziness 06/21/2018   Gait disorder 01/24/2018   Left hip pain 11/11/2017   Abdominal wall hernia 05/26/2017   Recurrent urinary tract infection 11/13/2016   Chronic diastolic CHF (congestive heart failure) ([place]) 11/12/2016   Fatigue 08/19/2016   History of bladder cancer 06/18/2016   History of prostate cancer 06/18/2016   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[place]) 06/12/2016   Carotid bruit 02/17/2016   Prediabetes 08/20/2015   Glaucoma 02/14/2015   Varicose veins of lower extremities with other complications 03/50/0938   Asthma 10/27/2011   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   Current Outpatient Medications on File Prior to Encounter  Medication Sig Dispense Refill   acetaminophen (TYLENOL) 500 MG tablet Take 1,000 mg by mouth every 6 (six) hours as needed for headache or fever (pain).     albuterol (VENTOLIN HFA) 108 (90 Base) MCG/ACT inhaler Inhale 2 puffs into the lungs every 6 (six) hours as needed for wheezing or shortness of breath. 1 each 8   Fluticasone-Salmeterol 232-14 MCG/ACT AEPB Inhale 1 puff into the lungs in the morning and at bedtime. (Patient taking differently: Inhale 1 puff into the lungs at bedtime.) 1 each 11   latanoprost (XALATAN) 0.005 % ophthalmic solution Place 1 drop into the right eye at bedtime.     magnesium oxide (  MAG-OX) 400 MG tablet Take 400 mg by mouth daily with supper.     Misc Natural Products (GLUCOSAMINE CHOND DOUBLE STR PO) Take 1 tablet by mouth 2 (two) times daily.     montelukast (SINGULAIR) 10 MG tablet Take 1 tablet (10 mg total) by mouth at bedtime. 90 tablet 2   triamcinolone (NASACORT)  55 MCG/ACT AERO nasal inhaler Place 1 spray into the nose at bedtime as needed (congestion/nasal drainage).     venlafaxine (EFFEXOR) 75 MG tablet Take 1.5 tablets (112.5 mg total) by mouth daily. (Patient taking differently: Take 112.5 mg by mouth every morning.) 135 tablet 1   nitrofurantoin, macrocrystal-monohydrate, (MACROBID) 100 MG capsule Take 1 capsule (100 mg total) by mouth 2 (two) times daily. (Patient not taking: Reported on 03/17/2021) 14 capsule 0    Past Medical History:  Diagnosis Date   Allergic rhinitis    Arthritis    Benign localized prostatic hyperplasia with lower urinary tract symptoms (LUTS)    Bladder tumor    Borderline glaucoma of right eye    Cancer ([place])    bladder and prostate   Carotid bruit    per duplex 03-11-2016 RICA 1-39%   Chronic throat clearing    COVID-19    DDD (degenerative disc disease), lumbar    Depression    ED (erectile dysfunction)    Elevated PSA    urologist-  dr [name]--- s/p  prostate bx's   GERD (gastroesophageal reflux disease)    Hematuria    History of chronic bronchitis    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    s/p  MOH's nasal --  pre-melanoma   History of squamous cell carcinoma excision    several times   Hyperlipidemia    Pre-diabetes    Premature ventricular contractions (PVCs) (VPCs)    RAD (reactive airway disease)    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  s/p  deep neck lymph node bx's and lung bx's   Sensorineural hearing loss (SNHL) of both ears    Sepsis ([place]) 09/2016   UTI (urinary tract infection) 08/2016   Wears glasses    Wears hearing aid    bilateral    Past Surgical History:  Procedure Laterality Date   APPENDECTOMY  2008   CARDIOVASCULAR STRESS TEST  05/27/2010   normal nuclear study w/ no ischemia/  normal LV function and wall motion , ef 60%   CYSTOSCOPY WITH INJECTION N/A 06/12/2016   Procedure: CYSTOSCOPY WITH INJECTION OF INDOCYANINE GREEN DYE;  Surgeon:  Alexis Frock, MD;  Location: [facility];  Service: Urology;  Laterality: N/A;   DEEP NECK LYMPH NODE BIOPSY / EXCISION  1970's   and Bronchoscopy w/ bx's ( dx Sarcoidosis)   ILEOSTOMY     IR NEPHROSTOMY PLACEMENT LEFT  11/18/2016   MOHS SURGERY  2013 approx.    nasal; pre melanoma   PARS PLANA VITRECTOMY Right 2007   repair macular pucker   TONSILLECTOMY AND ADENOIDECTOMY  child   TRANSURETHRAL RESECTION OF BLADDER TUMOR N/A 04/06/2016   Procedure: CYSTOSCOPY TRANSURETHRAL RESECTION OF BLADDER TUMORS (TURBT);  Surgeon: Carolan Clines, MD;  Location[facility];  Service: Urology;  Laterality: N/A;   TRANSURETHRAL RESECTION OF PROSTATE      Social History   Tobacco Use   Smoking status: Never   Smokeless tobacco: Never  Vaping Use   Vaping Use: Never used  Substance Use Topics   Alcohol use: No  Drug use: No    Family History  Problem Relation Age of Onset   Stroke Mother        in her 20s   Breast cancer Sister    Heart disease Maternal Grandmother 75       MI    Breast cancer Paternal Grandmother    Heart disease Paternal Grandfather 40       MI   Stroke Brother    Healthy Father    Diabetes Neg Hx     PE: Vitals:   03/17/21 2135 03/18/21 0528 03/18/21 0801 03/18/21 1235  BP: 118/74 120/77  126/79  Pulse: 87 80  79  Resp: 18 20  18   Temp: 98.1 F (36.7 C) 98.6 F (37 C)  98.2 F (36.8 C)  TempSrc: Oral Oral  Oral  SpO2: 94% 93% 96% 95%  Weight:      Height:       Patient appears to be in no acute distress  patient is alert and oriented x3 Atraumatic normocephalic head No cervical or supraclavicular lymphadenopathy appreciated No increased work of breathing, no audible wheezes/rhonchi Regular sinus rhythm/rate Abdomen is soft, nontender, nondistended, right CVA tenderness The patient's ostomy is pink and viable.  The urine is clear yellow. Lower extremities are symmetric without appreciable edema Grossly neurologically intact No  identifiable skin lesions  Recent Labs    03/16/21 2053 03/17/21 0343 03/18/21 0651  WBC 21.0* 19.3* 14.2*  HGB 12.9* 12.4* 12.4*  HCT 38.3* 37.8* 37.9*   Recent Labs    03/16/21 2053 03/17/21 0343 03/18/21 0651  NA 134* 136 138  K 3.7 3.7 3.5  CL 106 109 110  CO2 19* 20* 23  GLUCOSE 118* 154* 108*  BUN 33* 28* 20  CREATININE 1.54* 1.52* 1.30*  CALCIUM 8.8* 8.8* 8.8*   No results for input(s): LABPT, INR in the last 72 hours. No results for input(s): LABURIN in the last 72 hours. Results for orders placed or performed during the hospital encounter of 03/16/21  Urine Culture     Status: Abnormal (Preliminary result)   Collection Time: 03/16/21  6:46 PM   Specimen: Urine, Random  Result Value Ref Range Status   Specimen Description   Final    URINE, RANDOM Performed at [facility], [address] [place], [place] [address]    Special Requests   Final    NONE Performed at [facility], [address] [place], [name] [address]    Culture >=100,000 COLONIES/mL GRAM NEGATIVE RODS (A)  Final   Report Status PENDING  Incomplete  Blood culture (routine x 2)     Status: Abnormal (Preliminary result)   Collection Time: 03/16/21  9:03 PM   Specimen: Right Antecubital; Blood  Result Value Ref Range Status   Specimen Description   Final    RIGHT ANTECUBITAL BLOOD Performed at [facility], [address] [place], [place] [address]    Special Requests   Final    Blood Culture adequate volume BOTTLES DRAWN AEROBIC AND ANAEROBIC Performed at [facility], [address] [place], [name] [address]    Culture  Setup Time   Final    AEROBIC BOTTLE ONLY GRAM NEGATIVE RODS CRITICAL RESULT CALLED TO, READ BACK BY AND VERIFIED WITH: PHARMD LEIGHANN P. 03/17/21@23 :41 BY TW    Culture (A)  Final    KLEBSIELLA PNEUMONIAE SUSCEPTIBILITIES TO FOLLOW Performed at [facility], [place]  [address], [place], [place] [address]     Report Status PENDING  Incomplete  Blood Culture ID Panel (Reflexed)     Status: Abnormal   Collection Time: 03/16/21  9:03 PM  Result Value Ref Range Status   Enterococcus faecalis NOT DETECTED NOT DETECTED Final   Enterococcus Faecium NOT DETECTED NOT DETECTED Final   Listeria monocytogenes NOT DETECTED NOT DETECTED Final   Staphylococcus species NOT DETECTED NOT DETECTED Final   Staphylococcus aureus (BCID) NOT DETECTED NOT DETECTED Final   Staphylococcus epidermidis NOT DETECTED NOT DETECTED Final   Staphylococcus lugdunensis NOT DETECTED NOT DETECTED Final   Streptococcus species NOT DETECTED NOT DETECTED Final   Streptococcus agalactiae NOT DETECTED NOT DETECTED Final   Streptococcus pneumoniae NOT DETECTED NOT DETECTED Final   Streptococcus pyogenes NOT DETECTED NOT DETECTED Final   A.calcoaceticus-baumannii NOT DETECTED NOT DETECTED Final   Bacteroides fragilis NOT DETECTED NOT DETECTED Final   Enterobacterales DETECTED (A) NOT DETECTED Final    Comment: Enterobacterales represent a large order of gram negative bacteria, not a single organism. CRITICAL RESULT CALLED TO, READ BACK BY AND VERIFIED WITH: PHARMD LEIGHANN P. 03/17/21@23 :41 BY TW    Enterobacter cloacae complex NOT DETECTED NOT DETECTED Final   Escherichia coli NOT DETECTED NOT DETECTED Final   Klebsiella aerogenes NOT DETECTED NOT DETECTED Final   Klebsiella oxytoca NOT DETECTED NOT DETECTED Final   Klebsiella pneumoniae DETECTED (A) NOT DETECTED Final    Comment: CRITICAL RESULT CALLED TO, READ BACK BY AND VERIFIED WITH: PHARMD LEIGHANN P. 03/17/21@23 :41 BY TW    Proteus species NOT DETECTED NOT DETECTED Final   Salmonella species NOT DETECTED NOT DETECTED Final   Serratia marcescens NOT DETECTED NOT DETECTED Final   Haemophilus influenzae NOT DETECTED NOT DETECTED Final   Neisseria meningitidis NOT DETECTED NOT DETECTED Final   Pseudomonas aeruginosa NOT DETECTED NOT DETECTED Final   Stenotrophomonas  maltophilia NOT DETECTED NOT DETECTED Final   Candida albicans NOT DETECTED NOT DETECTED Final   Candida auris NOT DETECTED NOT DETECTED Final   Candida glabrata NOT DETECTED NOT DETECTED Final   Candida krusei NOT DETECTED NOT DETECTED Final   Candida parapsilosis NOT DETECTED NOT DETECTED Final   Candida tropicalis NOT DETECTED NOT DETECTED Final   Cryptococcus neoformans/gattii NOT DETECTED NOT DETECTED Final   CTX-M ESBL NOT DETECTED NOT DETECTED Final   Carbapenem resistance IMP NOT DETECTED NOT DETECTED Final   Carbapenem resistance KPC NOT DETECTED NOT DETECTED Final   Carbapenem resistance NDM NOT DETECTED NOT DETECTED Final   Carbapenem resist OXA 48 LIKE NOT DETECTED NOT DETECTED Final   Carbapenem resistance VIM NOT DETECTED NOT DETECTED Final    Comment: Performed at [facility], [address] [place], [place] [address]  Blood culture (routine x 2)     Status: None (Preliminary result)   Collection Time: 03/16/21  9:10 PM   Specimen: Left Antecubital; Blood  Result Value Ref Range Status   Specimen Description   Final    LEFT ANTECUBITAL BLOOD Performed at [facility], [address] [place], [place] [address]    Special Requests   Final    Blood Culture adequate volume BOTTLES DRAWN AEROBIC AND ANAEROBIC Performed at [facility], [address] [place], [name] [address]    Culture  Setup Time   Final    GRAM NEGATIVE RODS AEROBIC BOTTLE ONLY CRITICAL VALUE NOTED.  VALUE IS CONSISTENT WITH PREVIOUSLY REPORTED AND CALLED VALUE.  Culture   Final    NO GROWTH < 24 HOURS Performed at [facility], [place] [address] [place], [place] [address]    Report Status PENDING  Incomplete  Resp Panel by RT-PCR (Flu A&B, Covid) Nasopharyngeal Swab     Status: None   Collection Time: 03/17/21  9:34 AM   Specimen: Nasopharyngeal Swab; Nasopharyngeal(NP) swabs in vial transport medium  Result Value Ref Range Status   SARS Coronavirus 2 by RT  PCR NEGATIVE NEGATIVE Final    Comment: (NOTE) SARS-CoV-2 target nucleic acids are NOT DETECTED.  The SARS-CoV-2 RNA is generally detectable in upper respiratory specimens during the acute phase of infection. The lowest concentration of SARS-CoV-2 viral copies this assay can detect is 138 copies/mL. A negative result does not preclude SARS-Cov-2 infection and should not be used as the sole basis for treatment or other patient management decisions. A negative result may occur with  improper specimen collection/handling, submission of specimen other than nasopharyngeal swab, presence of viral mutation(s) within the areas targeted by this assay, and inadequate number of viral copies(<138 copies/mL). A negative result must be combined with clinical observations, patient history, and epidemiological information. The expected result is Negative.  Fact Sheet for Patients:  EntrepreneurPulse.com.au  Fact Sheet for Healthcare Providers:  IncredibleEmployment.be  This test is no t yet approved or cleared by the [place] FDA and  has been authorized for detection and/or diagnosis of SARS-CoV-2 by FDA under an Emergency Use Authorization (EUA). This EUA will remain  in effect (meaning this test can be used) for the duration of the COVID-19 declaration under Section 564(b)(1) of the Act, 21 U.S.C.section 360bbb-3(b)(1), unless the authorization is terminated  or revoked sooner.       Influenza A by PCR NEGATIVE NEGATIVE Final   Influenza B by PCR NEGATIVE NEGATIVE Final    Comment: (NOTE) The Xpert Xpress SARS-CoV-2/FLU/RSV plus assay is intended as an aid in the diagnosis of influenza from Nasopharyngeal swab specimens and should not be used as a sole basis for treatment. Nasal washings and aspirates are unacceptable for Xpert Xpress SARS-CoV-2/FLU/RSV testing.  Fact Sheet for Patients: EntrepreneurPulse.com.au  Fact Sheet for  Healthcare Providers: IncredibleEmployment.be  This test is not yet approved or cleared by the [place] FDA and has been authorized for detection and/or diagnosis of SARS-CoV-2 by FDA under an Emergency Use Authorization (EUA). This EUA will remain in effect (meaning this test can be used) for the duration of the COVID-19 declaration under Section 564(b)(1) of the Act, 21 U.S.C. section 360bbb-3(b)(1), unless the authorization is terminated or revoked.  Performed at [facility], [place] [address], [place], [place] [address]     Imaging: I dependently reviewed the patient's CT scan discussed with the patient and his wife.  I also reviewed the patient's CT scan from September 2022 as well as the report of his MAG3 Lasix renogram performed in November 2022 at [facility].  Today's scan demonstrates stable amount of hydronephrosis bilaterally.  There are no additional relevant findings to his current circumstance.   Imp: The patient has pyelonephritis, as well as bacteremia.  He has a history of multidrug-resistant infections.  This infection appears to be different, growing Klebsiella and Enterococcus.  The patient's CT scan demonstrates no significant change or progression of his hydronephrosis.  His right kidney does drain based on a Lasix renogram from November.  His left kidney is partially obstructed, but also does drain.  Fortunately his left  kidney is not tender and does not appear to be the proximate cause of his infection.   Recommendations: Again, long discussion with the family.  I spoke to them about the CT scan findings and the fact that there is no significant difference or change.  I reminded them of the findings of the Lasix renogram performed in November.  Given that its the right side the patient is complaining of pain and this is the kidney that seems to be unobstructed I do not see any reason for decompression.  I  suspect the patient will turn around with culture specific antibiotics.  Moving forward, the patient does need to work aggressively on preventing these types of infections.  He is scheduled to see his primary urologist in a couple weeks, and hopefully that discussion will revolve around UTI prevention strategies.  For now, there is nothing acute that needs to happen from a urologic perspective.  Thank you for involving me in this patient's care, please page with any further questions or concerns. Ardis Hughs

## 2021-03-18 NOTE — Plan of Care (Signed)

## 2021-03-18 NOTE — Progress Notes (Signed)
PROGRESS NOTE  Adrian Rios MPN:[id number] DOB: 12/22/1940 DOA: 03/16/2021 PCP: [name], MD  HPI/Recap of past 24 hours:  Adrian Rios is a 81 y.o. male with medical history significant for bladder cancer s/p cystectomy with ileal conduit in April 2018, hx of recurrent UTI/pyelonephritis, sarcoidosis, chronic bilateral hydronephrosis, recent hx of ESBL UTI who presents to [facility] ED from home with fever and right flank pain.  He was treated by his primary physician last week for urinary tract infection that was diagnosed on January 5.  Urine culture at that time showed ESBL E. coli sensitive to nitrofurantoin and he was prescribed nitrofurantoin which he completed a few days ago.  Within 2 days he developed fever again to 101.9 degrees with chills and worsening right flank pain.  He presented to the ED for further evaluation.    Work-up revealed R pyelonephritis, gram-negative rods UTI, Klebsiella pneumonia bacteremia.  Patient has a history of ESBL E. coli and therefore was started on Merrem on 03/17/2021 for presumptive ESBL infection.  CT abdomen and pelvis showed bilateral hydronephrosis, stable from prior imaging.  Infectious disease and Urology following and assisting with the management.  Blood cultures obtained on 03/16/2021 positive for Klebsiella pneumonia, sensitivities are pending.  Repeated blood cultures on 03/18/2021, pending.  03/18/2021: Patient was seen and examined with his wife at his bedside.  He has no new complaints.  Denies flank pain or nausea.  Currently on IV Merrem for presumptive ESBL infection.  Awaiting results of sensitivities.  Assessment/Plan: Principal Problem:   Pyelonephritis Active Problems:   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HCC)   History of bladder cancer   History of ESBL E. coli infection   Hydronephrosis   Right flank pain   CKD (chronic kidney disease) stage 3, GFR 30-59 ml/min (HCC)   Leukocytosis  R Pyelonephritis Presented with right flank pain, fever,  UTI He is started on meropenem in the emergency room after consultation with pharmacy.  Meropenem continued.  History of ESBL E. coli and Klebsiella infections in the past. Recently finished a nitrofurantoin antibiotic course as an outpatient but symptoms recurred within 1 to 2 days of stopping that antibiotic course IV fluid hydration, gentle to avoid volume overload UA positive for pyuria, urine culture growing greater than 100,000 colonies gram-negative rods.  Complicated UTI, gram-negative rods Currently on IV Merrem, continue Follow sensitivities to narrow down antibiotics History of bladder cancer post ileal conduit Bilateral hydronephrosis, chronic Urology consulted  Klebsiella pneumonia bacteremia, POA Continue to follow blood cultures for sensitivities Narrow down antibiotics according to sensitivities. Repeat blood cultures on 03/18/2021, follow  AKI, likely prerenal in the setting of dehydration Baseline creatinine appears to be 0.9 with GFR greater than 60 Presented with creatinine of 1.54 with GFR 45 Received IV fluid hydration with improvement of his creatinine Creatinine is downtrending Continue to avoid nephrotoxic agents, dehydration and hypotension Closely monitor urine output with strict I's and O's and daily weight.  Chronic bilateral hydronephrosis Seen again on CT scan Urology will see in consultation.  Chronic anxiety/depression Stable Continue home Effexor    S/P ileal conduit    History of bladder cancer   History of ESBL E. coli infection  Physical debility PT OT to assess Fall precautions.  Critical care time: 65 minutes.     DVT prophylaxis:      Lovenox for DVT prophylaxis.   Code Status:              Full Code  Family  Communication:       Diagnosis and plan discussed with patient.  Patient verbalized understanding and agrees with plan.  Questions answered.  Further recommendations to follow as clinically indicated Disposition Plan:               Patient is from:                        Home             Anticipated DC to:                   Home             Anticipated DC date:               Anticipate 2 midnight or more stay to treat acute condition             Time Spent:                             75 minutes which includes evaluating patient, examining patient, writing orders, reviewing labs and previous microbiology reports, and radiology studies     Consultants: Infectious disease Urology.    Status is: Inpatient  Patient requires at least 2 midnights for further evaluation and treatment of present condition.      Objective: Vitals:   03/17/21 2135 03/18/21 0528 03/18/21 0801 03/18/21 1235  BP: 118/74 120/77  126/79  Pulse: 87 80  79  Resp: 18 20  18   Temp: 98.1 F (36.7 C) 98.6 F (37 C)  98.2 F (36.8 C)  TempSrc: Oral Oral  Oral  SpO2: 94% 93% 96% 95%  Weight:      Height:        Intake/Output Summary (Last 24 hours) at 03/18/2021 1238 Last data filed at 03/18/2021 3614 Gross per 24 hour  Intake 1250.83 ml  Output 1050 ml  Net 200.83 ml   Filed Weights   03/16/21 1831 03/17/21 0229  Weight: 79.4 kg 78.3 kg    Exam:  General: 81 y.o. year-old male well developed well nourished in no acute distress.  Alert and oriented x3. Cardiovascular: Regular rate and rhythm with no rubs or gallops.  No thyromegaly or JVD noted.   Respiratory: Clear to auscultation with no wheezes or rales. Good inspiratory effort. Abdomen: Soft nontender nondistended with normal bowel sounds x4 quadrants. Musculoskeletal: No lower extremity edema. 2/4 pulses in all 4 extremities. Skin: No ulcerative lesions noted or rashes, Psychiatry: Mood is appropriate for condition and setting Neuro: Moves all 4 extremities freely.   Data Reviewed: CBC: Recent Labs  Lab 03/16/21 2053 03/17/21 0343 03/18/21 0651  WBC 21.0* 19.3* 14.2*  NEUTROABS 17.9*  --  10.7*  HGB 12.9* 12.4* 12.4*  HCT 38.3* 37.8* 37.9*  MCV 90.8 93.3  93.3  PLT 362 332 431   Basic Metabolic Panel: Recent Labs  Lab 03/16/21 2053 03/17/21 0343 03/18/21 0651  NA 134* 136 138  K 3.7 3.7 3.5  CL 106 109 110  CO2 19* 20* 23  GLUCOSE 118* 154* 108*  BUN 33* 28* 20  CREATININE 1.54* 1.52* 1.30*  CALCIUM 8.8* 8.8* 8.8*   GFR: Estimated Creatinine Clearance: 42.4 mL/min (A) (by C-G formula based on SCr of 1.3 mg/dL (H)). Liver Function Tests: Recent Labs  Lab 03/16/21 2053 03/18/21 0651  AST 51* 17  ALT 73* 40  ALKPHOS 141*  144*  BILITOT 1.0 0.8  PROT 7.1 6.5  ALBUMIN 3.2* 2.8*   Recent Labs  Lab 03/16/21 2053  LIPASE 28   No results for input(s): AMMONIA in the last 168 hours. Coagulation Profile: No results for input(s): INR, PROTIME in the last 168 hours. Cardiac Enzymes: No results for input(s): CKTOTAL, CKMB, CKMBINDEX, TROPONINI in the last 168 hours. BNP (last 3 results) No results for input(s): PROBNP in the last 8760 hours. HbA1C: No results for input(s): HGBA1C in the last 72 hours. CBG: No results for input(s): GLUCAP in the last 168 hours. Lipid Profile: No results for input(s): CHOL, HDL, LDLCALC, TRIG, CHOLHDL, LDLDIRECT in the last 72 hours. Thyroid Function Tests: No results for input(s): TSH, T4TOTAL, FREET4, T3FREE, THYROIDAB in the last 72 hours. Anemia Panel: No results for input(s): VITAMINB12, FOLATE, FERRITIN, TIBC, IRON, RETICCTPCT in the last 72 hours. Urine analysis:    Component Value Date/Time   COLORURINE YELLOW 03/16/2021 1846   APPEARANCEUR CLOUDY (A) 03/16/2021 1846   LABSPEC 1.015 03/16/2021 1846   PHURINE 6.5 03/16/2021 1846   GLUCOSEU NEGATIVE 03/16/2021 1846   GLUCOSEU NEGATIVE 03/06/2021 1448   HGBUR SMALL (A) 03/16/2021 1846   HGBUR negative 07/11/2009 0813   BILIRUBINUR NEGATIVE 03/16/2021 1846   KETONESUR NEGATIVE 03/16/2021 1846   PROTEINUR 30 (A) 03/16/2021 1846   UROBILINOGEN 0.2 03/06/2021 1448   NITRITE POSITIVE (A) 03/16/2021 1846   LEUKOCYTESUR LARGE (A)  03/16/2021 1846   Sepsis Labs: @LABRCNTIP (procalcitonin:4,lacticidven:4)  ) Recent Results (from the past 240 hour(s))  Urine Culture     Status: Abnormal (Preliminary result)   Collection Time: 03/16/21  6:46 PM   Specimen: Urine, Random  Result Value Ref Range Status   Specimen Description   Final    URINE, RANDOM Performed at [facility], [address] [place], [place] [address]    Special Requests   Final    NONE Performed at [facility], [address] [place], [name] [address]    Culture >=100,000 COLONIES/mL GRAM NEGATIVE RODS (A)  Final   Report Status PENDING  Incomplete  Blood culture (routine x 2)     Status: Abnormal (Preliminary result)   Collection Time: 03/16/21  9:03 PM   Specimen: Right Antecubital; Blood  Result Value Ref Range Status   Specimen Description   Final    RIGHT ANTECUBITAL BLOOD Performed at [facility], [address] [place], [place] [address]    Special Requests   Final    Blood Culture adequate volume BOTTLES DRAWN AEROBIC AND ANAEROBIC Performed at [facility], [address] [place], [name] [address]    Culture  Setup Time   Final    AEROBIC BOTTLE ONLY GRAM NEGATIVE RODS CRITICAL RESULT CALLED TO, READ BACK BY AND VERIFIED WITH: PHARMD LEIGHANN P. 03/17/21@23 :41 BY TW    Culture (A)  Final    KLEBSIELLA PNEUMONIAE SUSCEPTIBILITIES TO FOLLOW Performed at [facility], [place] [address] [place], [place] [address]    Report Status PENDING  Incomplete  Blood Culture ID Panel (Reflexed)     Status: Abnormal   Collection Time: 03/16/21  9:03 PM  Result Value Ref Range Status   Enterococcus faecalis NOT DETECTED NOT DETECTED Final   Enterococcus Faecium NOT DETECTED NOT DETECTED Final   Listeria monocytogenes NOT DETECTED NOT DETECTED Final   Staphylococcus species NOT DETECTED NOT DETECTED Final   Staphylococcus aureus (BCID) NOT DETECTED NOT  DETECTED Final   Staphylococcus  epidermidis NOT DETECTED NOT DETECTED Final   Staphylococcus lugdunensis NOT DETECTED NOT DETECTED Final   Streptococcus species NOT DETECTED NOT DETECTED Final   Streptococcus agalactiae NOT DETECTED NOT DETECTED Final   Streptococcus pneumoniae NOT DETECTED NOT DETECTED Final   Streptococcus pyogenes NOT DETECTED NOT DETECTED Final   A.calcoaceticus-baumannii NOT DETECTED NOT DETECTED Final   Bacteroides fragilis NOT DETECTED NOT DETECTED Final   Enterobacterales DETECTED (A) NOT DETECTED Final    Comment: Enterobacterales represent a large order of gram negative bacteria, not a single organism. CRITICAL RESULT CALLED TO, READ BACK BY AND VERIFIED WITH: PHARMD LEIGHANN P. 03/17/21@23 :41 BY TW    Enterobacter cloacae complex NOT DETECTED NOT DETECTED Final   Escherichia coli NOT DETECTED NOT DETECTED Final   Klebsiella aerogenes NOT DETECTED NOT DETECTED Final   Klebsiella oxytoca NOT DETECTED NOT DETECTED Final   Klebsiella pneumoniae DETECTED (A) NOT DETECTED Final    Comment: CRITICAL RESULT CALLED TO, READ BACK BY AND VERIFIED WITH: PHARMD LEIGHANN P. 03/17/21@23 :41 BY TW    Proteus species NOT DETECTED NOT DETECTED Final   Salmonella species NOT DETECTED NOT DETECTED Final   Serratia marcescens NOT DETECTED NOT DETECTED Final   Haemophilus influenzae NOT DETECTED NOT DETECTED Final   Neisseria meningitidis NOT DETECTED NOT DETECTED Final   Pseudomonas aeruginosa NOT DETECTED NOT DETECTED Final   Stenotrophomonas maltophilia NOT DETECTED NOT DETECTED Final   Candida albicans NOT DETECTED NOT DETECTED Final   Candida auris NOT DETECTED NOT DETECTED Final   Candida glabrata NOT DETECTED NOT DETECTED Final   Candida krusei NOT DETECTED NOT DETECTED Final   Candida parapsilosis NOT DETECTED NOT DETECTED Final   Candida tropicalis NOT DETECTED NOT DETECTED Final   Cryptococcus neoformans/gattii NOT DETECTED NOT DETECTED Final   CTX-M ESBL NOT DETECTED NOT DETECTED Final   Carbapenem  resistance IMP NOT DETECTED NOT DETECTED Final   Carbapenem resistance KPC NOT DETECTED NOT DETECTED Final   Carbapenem resistance NDM NOT DETECTED NOT DETECTED Final   Carbapenem resist OXA 48 LIKE NOT DETECTED NOT DETECTED Final   Carbapenem resistance VIM NOT DETECTED NOT DETECTED Final    Comment: Performed at [facility], [address], [place], [place] [address]  Blood culture (routine x 2)     Status: None (Preliminary result)   Collection Time: 03/16/21  9:10 PM   Specimen: Left Antecubital; Blood  Result Value Ref Range Status   Specimen Description   Final    LEFT ANTECUBITAL BLOOD Performed at [facility], [address] [place], [place] [address]    Special Requests   Final    Blood Culture adequate volume BOTTLES DRAWN AEROBIC AND ANAEROBIC Performed at [facility], [address] [place], [name] [address]    Culture  Setup Time   Final    GRAM NEGATIVE RODS AEROBIC BOTTLE ONLY CRITICAL VALUE NOTED.  VALUE IS CONSISTENT WITH PREVIOUSLY REPORTED AND CALLED VALUE.    Culture   Final    NO GROWTH < 24 HOURS Performed at [facility], [place] [address] [place], [place] [address]    Report Status PENDING  Incomplete  Resp Panel by RT-PCR (Flu A&B, Covid) Nasopharyngeal Swab     Status: None   Collection Time: 03/17/21  9:34 AM   Specimen: Nasopharyngeal Swab; Nasopharyngeal(NP) swabs in vial transport medium  Result Value Ref Range Status   SARS Coronavirus 2 by RT PCR  NEGATIVE NEGATIVE Final    Comment: (NOTE) SARS-CoV-2 target nucleic acids are NOT DETECTED.  The SARS-CoV-2 RNA is generally detectable in upper respiratory specimens during the acute phase of infection. The lowest concentration of SARS-CoV-2 viral copies this assay can detect is 138 copies/mL. A negative result does not preclude SARS-Cov-2 infection and should not be used as the sole basis for treatment or other patient management decisions. A negative result may  occur with  improper specimen collection/handling, submission of specimen other than nasopharyngeal swab, presence of viral mutation(s) within the areas targeted by this assay, and inadequate number of viral copies(<138 copies/mL). A negative result must be combined with clinical observations, patient history, and epidemiological information. The expected result is Negative.  Fact Sheet for Patients:  EntrepreneurPulse.com.au  Fact Sheet for Healthcare Providers:  IncredibleEmployment.be  This test is no t yet approved or cleared by the [place] FDA and  has been authorized for detection and/or diagnosis of SARS-CoV-2 by FDA under an Emergency Use Authorization (EUA). This EUA will remain  in effect (meaning this test can be used) for the duration of the COVID-19 declaration under Section 564(b)(1) of the Act, 21 U.S.C.section 360bbb-3(b)(1), unless the authorization is terminated  or revoked sooner.       Influenza A by PCR NEGATIVE NEGATIVE Final   Influenza B by PCR NEGATIVE NEGATIVE Final    Comment: (NOTE) The Xpert Xpress SARS-CoV-2/FLU/RSV plus assay is intended as an aid in the diagnosis of influenza from Nasopharyngeal swab specimens and should not be used as a sole basis for treatment. Nasal washings and aspirates are unacceptable for Xpert Xpress SARS-CoV-2/FLU/RSV testing.  Fact Sheet for Patients: EntrepreneurPulse.com.au  Fact Sheet for Healthcare Providers: IncredibleEmployment.be  This test is not yet approved or cleared by the [place] FDA and has been authorized for detection and/or diagnosis of SARS-CoV-2 by FDA under an Emergency Use Authorization (EUA). This EUA will remain in effect (meaning this test can be used) for the duration of the COVID-19 declaration under Section 564(b)(1) of the Act, 21 U.S.C. section 360bbb-3(b)(1), unless the authorization is terminated  or revoked.  Performed at [facility], [place] [address] [place], [place] [address]       Studies: No results found.  Scheduled Meds:  enoxaparin (LOVENOX) injection  40 mg Subcutaneous Q24H   latanoprost  1 drop Right Eye QHS   mometasone-formoterol  2 puff Inhalation BID   venlafaxine  112.5 mg Oral Daily    Continuous Infusions:  lactated ringers 100 mL/hr at 03/18/21 1143   meropenem (MERREM) IV 1 g (03/18/21 1051)     LOS: 1 day     Kayleen Memos, MD [facility] Pager [phone]  If 7PM-7AM, please contact night-coverage [email or web address] Password [facility] 03/18/2021, 12:38 PM

## 2021-03-19 LAB — CBC WITH DIFFERENTIAL/PLATELET
Abs Immature Granulocytes: 0.03 10*3/uL (ref 0.00–0.07)
Basophils Absolute: 0.1 10*3/uL (ref 0.0–0.1)
Basophils Relative: 0 %
Eosinophils Absolute: 0.2 10*3/uL (ref 0.0–0.5)
Eosinophils Relative: 2 %
HCT: 36.4 % — ABNORMAL LOW (ref 39.0–52.0)
Hemoglobin: 12 g/dL — ABNORMAL LOW (ref 13.0–17.0)
Immature Granulocytes: 0 %
Lymphocytes Relative: 18 %
Lymphs Abs: 2.1 10*3/uL (ref 0.7–4.0)
MCH: 31.2 pg (ref 26.0–34.0)
MCHC: 33 g/dL (ref 30.0–36.0)
MCV: 94.5 fL (ref 80.0–100.0)
Monocytes Absolute: 0.8 10*3/uL (ref 0.1–1.0)
Monocytes Relative: 7 %
Neutro Abs: 8.4 10*3/uL — ABNORMAL HIGH (ref 1.7–7.7)
Neutrophils Relative %: 73 %
Platelets: 299 10*3/uL (ref 150–400)
RBC: 3.85 MIL/uL — ABNORMAL LOW (ref 4.22–5.81)
RDW: 13.2 % (ref 11.5–15.5)
WBC: 11.5 10*3/uL — ABNORMAL HIGH (ref 4.0–10.5)
nRBC: 0 % (ref 0.0–0.2)

## 2021-03-19 LAB — COMPREHENSIVE METABOLIC PANEL
ALT: 37 U/L (ref 0–44)
AST: 25 U/L (ref 15–41)
Albumin: 2.8 g/dL — ABNORMAL LOW (ref 3.5–5.0)
Alkaline Phosphatase: 145 U/L — ABNORMAL HIGH (ref 38–126)
Anion gap: 5 (ref 5–15)
BUN: 18 mg/dL (ref 8–23)
CO2: 23 mmol/L (ref 22–32)
Calcium: 8.7 mg/dL — ABNORMAL LOW (ref 8.9–10.3)
Chloride: 109 mmol/L (ref 98–111)
Creatinine, Ser: 1.17 mg/dL (ref 0.61–1.24)
GFR, Estimated: >60 mL/min (ref >60)
Glucose, Bld: 113 mg/dL — ABNORMAL HIGH (ref 70–99)
Potassium: 3.8 mmol/L (ref 3.5–5.1)
Sodium: 137 mmol/L (ref 135–145)
Total Bilirubin: 0.5 mg/dL (ref 0.3–1.2)
Total Protein: 6.5 g/dL (ref 6.5–8.1)

## 2021-03-19 LAB — CULTURE, BLOOD (ROUTINE X 2): Special Requests: ADEQUATE

## 2021-03-19 LAB — MAGNESIUM: Magnesium: 1.8 mg/dL (ref 1.7–2.4)

## 2021-03-19 LAB — PHOSPHORUS: Phosphorus: 2.9 mg/dL (ref 2.5–4.6)

## 2021-03-19 MED ORDER — CIPROFLOXACIN HCL 500 MG PO TABS
500.0000 mg | ORAL_TABLET | Freq: Two times a day (BID) | ORAL | 0 refills | Status: DC
Start: 2021-03-20 — End: 2021-03-26

## 2021-03-19 MED ORDER — SODIUM CHLORIDE 0.9 % IV SOLN: INTRAVENOUS | Status: DC | PRN

## 2021-03-19 MED ORDER — FOSFOMYCIN TROMETHAMINE 3 G PO PACK: 3.0000 g | PACK | Freq: Once | ORAL | 0 refills | Status: AC

## 2021-03-19 NOTE — Progress Notes (Signed)
PT Cancellation Note  Patient Details Name: Adrian Rios MRN: [id number] DOB: Dec 07, 1940   Cancelled Treatment:    Reason Eval/Treat Not Completed: PT screened, no needs identified, will sign off  Verner Mould, DPT Acute Rehabilitation Services Office [phone] Pager [phone]

## 2021-03-19 NOTE — TOC Transition Note (Signed)
Transition of Care [facility]) - CM/SW Discharge Note   Patient Details  Name: Adrian Rios MRN: [id number] Date of Birth: 04-Jul-1940  Transition of [facility]) CM/SW Contact:  Leeroy Cha, RN Phone Number: 03/19/2021, 1:01 PM   Clinical Narrative:    Dcd to home with no toc needs present.   Final next level of care: Home/Self Care Barriers to Discharge: Barriers Resolved   Patient Goals and CMS Choice Patient states their goals for this hospitalization and ongoing recovery are:: to go home CMS Medicare.gov Compare Post Acute Care list provided to:: Patient    Discharge Placement                       Discharge Plan and Services   Discharge Planning Services: CM Consult                                 Social Determinants of Health (SDOH) Interventions     Readmission Risk Interventions No flowsheet data found.

## 2021-03-19 NOTE — Discharge Summary (Signed)
Physician Discharge Summary  Adrian Rios DXI:[id number] DOB: 12/06/1940 DOA: 03/16/2021  PCP: [name], MD  Admit date: 03/16/2021 Discharge date: 03/19/2021  Admitted From: Home Disposition: Home  Recommendations for Outpatient Follow-up:  Follow up with PCP in 1-2 weeks Follow-up with [facility] and infectious disease in 2 weeks Discharging on ciprofloxacin to complete antibiotic course for septicemia/UTI Follow-up urine culture susceptibilities for Proteus which was pending at time of discharge.  Home Health: No Equipment/Devices: None  Discharge Condition: Stable CODE STATUS: Full code Diet recommendation: Regular diet  History of present illness:  Adrian Rios is an 81 year old male with past medical history significant for bladder cancer s/p cystectomy with ileal conduit April 2018, history of recurrent UTIs/pyelonephritis, sarcoidosis, chronic bilateral hydronephrosis, recent history of ESBL UTI who presented to [facility] ED on 1/15 with fever and right flank pain.  Recently treated by his primary physician last week for urinary tract infection-diagnosed on January 5.  Urine culture at that time notable for ESBL E. coli sensitive to nitrofurantoin and completed course outpatient.  2 days after treatment, developed fever again to 101.9 with chills and worsening right flank pain.  In the ED, WBC 21, hemoglobin 12.9, platelets 362, lactic acid 0.9, sodium 134, potassium 3.7, chloride 106, bicarb 19, creatinine 1.54, BUN 33, glucose 118.  Lipase 28, total bilirubin 1.0.  CT abdomen/pelvis with chronic stable hydronephrosis with no obstructing stones and right pyelonephritis.  Patient was started on meropenem.  Hospital service was consulted for further evaluation and management.  Hospital course:  Klebsiella septicemia, POA Klebsiella pneumonia/Proteus mirabilis UTI Pyelonephritis, right Patient presenting to ED with recurrent right flank pain, fever with history of recurrent  UTI/pyelonephritis.  WBC elevated to 21.0 on admission. Urinalysis with Klebsiella pneumonia and Proteus and blood cultures positive for Klebsiella pneumonia.  Patient was initially started on meropenem and completed 3-day course while inpatient.  Infectious disease and urology were consulted during hospital course.  Will receive additional dose of fosfomycin following discharge and continue ciprofloxacin 500 mg p.o. twice daily for 7 additional days.  Outpatient follow-up with [facility]/infectious disease.  Follow-up finalized urine culture which Proteus susceptibilities were pending at time of discharge.  Acute renal failure Creatinine on admission 1.54, baseline 0.9.  Etiology likely secondary to prerenal azotemia in the setting of dehydration versus ATN from infectious etiology as above.  Patient was started on IV antibiotics and IV fluids with improvement of creatinine to 1.17 at time of discharge.  Chronic bilateral hydronephrosis Hx cystectomy with ileal conduit Patient follows with [place] [facility], Dr. Dimas Millin.  Was seen by urology while inpatient, Dr. Louis Meckel on 03/18/2021 with no acute recommendations at this time; and recommend outpatient follow-up with his primary urologist in a few weeks.  Anxiety/depression: Continue home Effexor   Discharge Diagnoses:  Principal Problem:   Pyelonephritis Active Problems:   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HCC)   History of bladder cancer   History of ESBL E. coli infection   Hydronephrosis   Right flank pain   CKD (chronic kidney disease) stage 3, GFR 30-59 ml/min (HCC)   Leukocytosis    Discharge Instructions  Discharge Instructions     Call MD for:  difficulty breathing, headache or visual disturbances   Complete by: As directed    Call MD for:  extreme fatigue   Complete by: As directed    Call MD for:  persistant dizziness or light-headedness   Complete by: As directed    Call MD for:  persistant nausea  and vomiting   Complete by: As directed     Call MD for:  severe uncontrolled pain   Complete by: As directed    Call MD for:  temperature >100.4   Complete by: As directed    Diet - low sodium heart healthy   Complete by: As directed    Increase activity slowly   Complete by: As directed       Allergies as of 03/19/2021       Reactions   Ivp Dye [iodinated Contrast Media] Shortness Of Breath, Itching, Palpitations   "eyes itching,  Heart racing,  Effected breathing" 10-27-16 pt with 13 hr pre-meds for CT without any reaction-kj   Metrizamide Shortness Of Breath, Itching, Palpitations   "eyes itching,  Heart racing,  Effected breathing" 10-27-16 pt with 13 hr pre-meds for CT without any reaction-kj   Shellfish Allergy Hives, Shortness Of Breath, Itching   Mostly crab   Gabapentin Other (See Comments)   REACTION: dizziness and flushing   Niacin-lovastatin Er Other (See Comments)   Did not feel good on medication   Gadolinium Hives   Adhesive [tape] Rash   Use paper tape        Medication List     STOP taking these medications    nitrofurantoin (macrocrystal-monohydrate) 100 MG capsule Commonly known as: Macrobid       TAKE these medications    acetaminophen 500 MG tablet Commonly known as: TYLENOL Take 1,000 mg by mouth every 6 (six) hours as needed for headache or fever (pain).   albuterol 108 (90 Base) MCG/ACT inhaler Commonly known as: VENTOLIN HFA Inhale 2 puffs into the lungs every 6 (six) hours as needed for wheezing or shortness of breath.   ciprofloxacin 500 MG tablet Commonly known as: Cipro Take 1 tablet (500 mg total) by mouth 2 (two) times daily for 7 days. Start taking on: March 20, 2021   Fluticasone-Salmeterol 232-14 MCG/ACT Aepb Inhale 1 puff into the lungs in the morning and at bedtime. What changed: when to take this   fosfomycin 3 g Pack Commonly known as: MONUROL Take 3 g by mouth once for 1 dose. Start taking on: March 20, 2021   GLUCOSAMINE CHOND DOUBLE STR PO Take 1  tablet by mouth 2 (two) times daily.   latanoprost 0.005 % ophthalmic solution Commonly known as: XALATAN Place 1 drop into the right eye at bedtime.   magnesium oxide 400 MG tablet Commonly known as: MAG-OX Take 400 mg by mouth daily with supper.   montelukast 10 MG tablet Commonly known as: SINGULAIR Take 1 tablet (10 mg total) by mouth at bedtime.   triamcinolone 55 MCG/ACT Aero nasal inhaler Commonly known as: NASACORT Place 1 spray into the nose at bedtime as needed (congestion/nasal drainage).   venlafaxine 75 MG tablet Commonly known as: EFFEXOR Take 1.5 tablets (112.5 mg total) by mouth daily. What changed: when to take this        Follow-up Information     Burns, Claudina Lick, MD. Schedule an appointment as soon as possible for a visit in 1 week(s).   Specialty: Internal Medicine Contact information: [name] [address] [phone]         Janalyn Harder, MD Follow up.   Specialty: Urology Contact information: [facility] [place] [address] [phone]         Ruthine Dose, MD Follow up.   Specialty: Infectious Diseases Contact information: [address]  [name] [address] [phone]                Allergies  Allergen Reactions   Ivp Dye [Iodinated Contrast Media] Shortness Of Breath, Itching and Palpitations    "eyes itching,  Heart racing,  Effected breathing" 10-27-16 pt with 13 hr pre-meds for CT without any reaction-kj   Metrizamide Shortness Of Breath, Itching and Palpitations     "eyes itching,  Heart racing,  Effected breathing" 10-27-16 pt with 13 hr pre-meds for CT without any reaction-kj   Shellfish Allergy Hives, Shortness Of Breath and Itching    Mostly crab   Gabapentin Other (See Comments)    REACTION: dizziness and flushing   Niacin-Lovastatin Er Other (See Comments)    Did not feel good on medication   Gadolinium Hives   Adhesive [Tape] Rash    Use  paper tape    Consultations: Urology, Dr. Louis Meckel Infectious disease, Dr. Alton Revere   Procedures/Studies: CT Renal Stone Study  Result Date: 03/16/2021 CLINICAL DATA:  Right flank pain. EXAM: CT ABDOMEN AND PELVIS WITHOUT CONTRAST TECHNIQUE: Multidetector CT imaging of the abdomen and pelvis was performed following the standard protocol without IV contrast. RADIATION DOSE REDUCTION: This exam was performed according to the departmental dose-optimization program which includes automated exposure control, adjustment of the mA and/or kV according to patient size and/or use of iterative reconstruction technique. COMPARISON:  January 05, 2021 FINDINGS: Lower chest: No acute abnormality. Hepatobiliary: No focal liver abnormality is seen. No gallstones, gallbladder wall thickening, or biliary dilatation. Pancreas: Unremarkable. No pancreatic ductal dilatation or surrounding inflammatory changes. Spleen: Normal in size without focal abnormality. Adrenals/Urinary Tract: Adrenal glands are unremarkable. Kidneys are normal in size. A 3.2 cm cyst is seen along the anterior aspect of the mid to lower left kidney. A 2.1 cm exophytic cyst is seen along the posterior aspect of the mid to lower right kidney. 2 mm and 4 mm nonobstructing renal calculi are seen within the mid and lower right kidney. Moderate to marked severity bilateral hydronephrosis and hydroureter are noted, without evidence of obstructing renal calculi. The urinary bladder is surgically absent. Stomach/Bowel: Stomach is within normal limits. The appendix is surgically absent. Surgically anastomosed bowel is seen within the right lower quadrant. No evidence of bowel dilatation. Noninflamed diverticula are seen throughout the sigmoid colon. Vascular/Lymphatic: Aortic atherosclerosis. No enlarged abdominal or pelvic lymph nodes. Reproductive: The prostate gland is surgically absent. Other: A right lower quadrant ostomy site is seen. No abdominopelvic ascites.  Musculoskeletal: No acute or significant osseous findings. IMPRESSION: 1. Findings consistent with transurethral resection of the urinary bladder, with predominant stable moderate to marked severity bilateral hydronephrosis and hydroureter, in the absence of obstructing renal calculi. 2. Subcentimeter nonobstructing right renal calculi. 3. Sigmoid diverticulosis. Aortic Atherosclerosis (ICD10-I70.0). Electronically Signed   By: Virgina Norfolk M.D.   On: 03/16/2021 21:50     Subjective: Patient seen examined bedside, resting comfortably.  Spouse present.  No specific complaints this morning.  Ready for discharge home.  Discussed continue antibiotics following discharge and needs close follow-up with his primary urologist which is scheduled in early February.  No other questions or concerns at this time.  Denies headache, no fever/chills/night sweats, no nausea/vomiting/diarrhea, no chest pain, no palpitations, no shortness of breath, no abdominal pain, no weakness, no fatigue, no paresthesias.  No acute events overnight per nursing staff.  Discharge Exam: Vitals:   03/19/21 0202 03/19/21 0800  BP:  122/72  Pulse:  68  Resp: 19 12  Temp:  98.8 F (37.1 C)  SpO2:     Vitals:   03/18/21 2042 03/19/21 0102 03/19/21 0202 03/19/21 0800  BP:    122/72  Pulse:    68  Resp:  18 19 12   Temp:    98.8 F (37.1 C)  TempSrc:    Oral  SpO2: 94%     Weight:      Height:        General: Pt is alert, awake, not in acute distress Cardiovascular: RRR, S1/S2 +, no rubs, no gallops Respiratory: CTA bilaterally, no wheezing, no rhonchi Abdominal: Soft, NT, ND, bowel sounds +, noted ostomy Extremities: no edema, no cyanosis    The results of significant diagnostics from this hospitalization (including imaging, microbiology, ancillary and laboratory) are listed below for reference.     Microbiology: Recent Results (from the past 240 hour(s))  Urine Culture     Status: Abnormal (Preliminary result)    Collection Time: 03/16/21  6:46 PM   Specimen: Urine, Random  Result Value Ref Range Status   Specimen Description   Final    URINE, RANDOM Performed at [facility], [address] [place], [place] [address]    Special Requests   Final    NONE Performed at [facility], [address] [place], [name] [address]    Culture (A)  Final    >=100,000 COLONIES/mL KLEBSIELLA PNEUMONIAE 60,000 COLONIES/mL PROTEUS MIRABILIS SUSCEPTIBILITIES TO FOLLOW Performed at [facility], [place] [address], [place], [place] [address]    Report Status PENDING  Incomplete   Organism ID, Bacteria KLEBSIELLA PNEUMONIAE (A)  Final      Susceptibility   Klebsiella pneumoniae - MIC*    AMPICILLIN >=32 RESISTANT Resistant     CEFAZOLIN <=4 SENSITIVE Sensitive     CEFEPIME <=0.12 SENSITIVE Sensitive     CEFTRIAXONE <=0.25 SENSITIVE Sensitive     CIPROFLOXACIN <=0.25 SENSITIVE Sensitive     GENTAMICIN <=1 SENSITIVE Sensitive     IMIPENEM <=0.25 SENSITIVE Sensitive     NITROFURANTOIN 64 INTERMEDIATE Intermediate     TRIMETH/SULFA <=20 SENSITIVE Sensitive     AMPICILLIN/SULBACTAM 4 SENSITIVE Sensitive     PIP/TAZO <=4 SENSITIVE Sensitive     * >=100,000 COLONIES/mL KLEBSIELLA PNEUMONIAE  Blood culture (routine x 2)     Status: Abnormal   Collection Time: 03/16/21  9:03 PM   Specimen: Right Antecubital; Blood  Result Value Ref Range Status   Specimen Description   Final    RIGHT ANTECUBITAL BLOOD Performed at [facility], [address] [place], [place] [address]    Special Requests   Final    Blood Culture adequate volume BOTTLES DRAWN AEROBIC AND ANAEROBIC Performed at [facility], [address] [place], [name] [address]    Culture  Setup Time   Final    AEROBIC BOTTLE ONLY GRAM NEGATIVE RODS CRITICAL RESULT CALLED TO, READ BACK BY AND VERIFIED WITH: PHARMD LEIGHANN P. 03/17/21@23 :41 BY TW Performed at [facility], [place] [address], [place], [place] [address]    Culture KLEBSIELLA PNEUMONIAE (A)  Final   Report Status 03/19/2021 FINAL  Final   Organism ID, Bacteria KLEBSIELLA PNEUMONIAE  Final      Susceptibility   Klebsiella pneumoniae - MIC*    AMPICILLIN >=32 RESISTANT Resistant     CEFAZOLIN <=4 SENSITIVE Sensitive     CEFEPIME <=0.12 SENSITIVE Sensitive  CEFTAZIDIME <=1 SENSITIVE Sensitive     CEFTRIAXONE <=0.25 SENSITIVE Sensitive     CIPROFLOXACIN <=0.25 SENSITIVE Sensitive     GENTAMICIN <=1 SENSITIVE Sensitive     IMIPENEM <=0.25 SENSITIVE Sensitive     TRIMETH/SULFA <=20 SENSITIVE Sensitive     AMPICILLIN/SULBACTAM 4 SENSITIVE Sensitive     PIP/TAZO <=4 SENSITIVE Sensitive     * KLEBSIELLA PNEUMONIAE  Blood Culture ID Panel (Reflexed)     Status: Abnormal   Collection Time: 03/16/21  9:03 PM  Result Value Ref Range Status   Enterococcus faecalis NOT DETECTED NOT DETECTED Final   Enterococcus Faecium NOT DETECTED NOT DETECTED Final   Listeria monocytogenes NOT DETECTED NOT DETECTED Final   Staphylococcus species NOT DETECTED NOT DETECTED Final   Staphylococcus aureus (BCID) NOT DETECTED NOT DETECTED Final   Staphylococcus epidermidis NOT DETECTED NOT DETECTED Final   Staphylococcus lugdunensis NOT DETECTED NOT DETECTED Final   Streptococcus species NOT DETECTED NOT DETECTED Final   Streptococcus agalactiae NOT DETECTED NOT DETECTED Final   Streptococcus pneumoniae NOT DETECTED NOT DETECTED Final   Streptococcus pyogenes NOT DETECTED NOT DETECTED Final   A.calcoaceticus-baumannii NOT DETECTED NOT DETECTED Final   Bacteroides fragilis NOT DETECTED NOT DETECTED Final   Enterobacterales DETECTED (A) NOT DETECTED Final    Comment: Enterobacterales represent a large order of gram negative bacteria, not a single organism. CRITICAL RESULT CALLED TO, READ BACK BY AND VERIFIED WITH: PHARMD LEIGHANN P. 03/17/21@23 :41 BY TW    Enterobacter cloacae complex NOT DETECTED NOT DETECTED Final   Escherichia coli  NOT DETECTED NOT DETECTED Final   Klebsiella aerogenes NOT DETECTED NOT DETECTED Final   Klebsiella oxytoca NOT DETECTED NOT DETECTED Final   Klebsiella pneumoniae DETECTED (A) NOT DETECTED Final    Comment: CRITICAL RESULT CALLED TO, READ BACK BY AND VERIFIED WITH: PHARMD LEIGHANN P. 03/17/21@23 :41 BY TW    Proteus species NOT DETECTED NOT DETECTED Final   Salmonella species NOT DETECTED NOT DETECTED Final   Serratia marcescens NOT DETECTED NOT DETECTED Final   Haemophilus influenzae NOT DETECTED NOT DETECTED Final   Neisseria meningitidis NOT DETECTED NOT DETECTED Final   Pseudomonas aeruginosa NOT DETECTED NOT DETECTED Final   Stenotrophomonas maltophilia NOT DETECTED NOT DETECTED Final   Candida albicans NOT DETECTED NOT DETECTED Final   Candida auris NOT DETECTED NOT DETECTED Final   Candida glabrata NOT DETECTED NOT DETECTED Final   Candida krusei NOT DETECTED NOT DETECTED Final   Candida parapsilosis NOT DETECTED NOT DETECTED Final   Candida tropicalis NOT DETECTED NOT DETECTED Final   Cryptococcus neoformans/gattii NOT DETECTED NOT DETECTED Final   CTX-M ESBL NOT DETECTED NOT DETECTED Final   Carbapenem resistance IMP NOT DETECTED NOT DETECTED Final   Carbapenem resistance KPC NOT DETECTED NOT DETECTED Final   Carbapenem resistance NDM NOT DETECTED NOT DETECTED Final   Carbapenem resist OXA 48 LIKE NOT DETECTED NOT DETECTED Final   Carbapenem resistance VIM NOT DETECTED NOT DETECTED Final    Comment: Performed at [facility], [address], [place], [place] [address]  Blood culture (routine x 2)     Status: Abnormal   Collection Time: 03/16/21  9:10 PM   Specimen: Left Antecubital; Blood  Result Value Ref Range Status   Specimen Description   Final    LEFT ANTECUBITAL BLOOD Performed at [facility], [address] [place], [name] [address]    Special Requests   Final    Blood Culture adequate volume BOTTLES DRAWN  AEROBIC AND ANAEROBIC Performed at  [facility], [address] [place], [name] [address]    Culture  Setup Time   Final    GRAM NEGATIVE RODS AEROBIC BOTTLE ONLY CRITICAL VALUE NOTED.  VALUE IS CONSISTENT WITH PREVIOUSLY REPORTED AND CALLED VALUE.    Culture (A)  Final    KLEBSIELLA PNEUMONIAE SUSCEPTIBILITIES PERFORMED ON PREVIOUS CULTURE WITHIN THE LAST 5 DAYS. Performed at [facility], [place] [address], [place], [place] [address]    Report Status 03/19/2021 FINAL  Final  Resp Panel by RT-PCR (Flu A&B, Covid) Nasopharyngeal Swab     Status: None   Collection Time: 03/17/21  9:34 AM   Specimen: Nasopharyngeal Swab; Nasopharyngeal(NP) swabs in vial transport medium  Result Value Ref Range Status   SARS Coronavirus 2 by RT PCR NEGATIVE NEGATIVE Final    Comment: (NOTE) SARS-CoV-2 target nucleic acids are NOT DETECTED.  The SARS-CoV-2 RNA is generally detectable in upper respiratory specimens during the acute phase of infection. The lowest concentration of SARS-CoV-2 viral copies this assay can detect is 138 copies/mL. A negative result does not preclude SARS-Cov-2 infection and should not be used as the sole basis for treatment or other patient management decisions. A negative result may occur with  improper specimen collection/handling, submission of specimen other than nasopharyngeal swab, presence of viral mutation(s) within the areas targeted by this assay, and inadequate number of viral copies(<138 copies/mL). A negative result must be combined with clinical observations, patient history, and epidemiological information. The expected result is Negative.  Fact Sheet for Patients:  EntrepreneurPulse.com.au  Fact Sheet for Healthcare Providers:  IncredibleEmployment.be  This test is no t yet approved or cleared by the [place] FDA and  has been authorized for detection and/or diagnosis of SARS-CoV-2 by FDA under an Emergency Use Authorization (EUA).  This EUA will remain  in effect (meaning this test can be used) for the duration of the COVID-19 declaration under Section 564(b)(1) of the Act, 21 U.S.C.section 360bbb-3(b)(1), unless the authorization is terminated  or revoked sooner.       Influenza A by PCR NEGATIVE NEGATIVE Final   Influenza B by PCR NEGATIVE NEGATIVE Final    Comment: (NOTE) The Xpert Xpress SARS-CoV-2/FLU/RSV plus assay is intended as an aid in the diagnosis of influenza from Nasopharyngeal swab specimens and should not be used as a sole basis for treatment. Nasal washings and aspirates are unacceptable for Xpert Xpress SARS-CoV-2/FLU/RSV testing.  Fact Sheet for Patients: EntrepreneurPulse.com.au  Fact Sheet for Healthcare Providers: IncredibleEmployment.be  This test is not yet approved or cleared by the [place] FDA and has been authorized for detection and/or diagnosis of SARS-CoV-2 by FDA under an Emergency Use Authorization (EUA). This EUA will remain in effect (meaning this test can be used) for the duration of the COVID-19 declaration under Section 564(b)(1) of the Act, 21 U.S.C. section 360bbb-3(b)(1), unless the authorization is terminated or revoked.  Performed at [facility], [place] [address], [place], [place] [address]   Culture, blood (routine x 2)     Status: None (Preliminary result)   Collection Time: 03/18/21 11:04 AM   Specimen: BLOOD  Result Value Ref Range Status   Specimen Description   Final    BLOOD BLOOD RIGHT HAND Performed at [facility] [address] [place], [place] [address]    Special Requests   Final    BOTTLES DRAWN AEROBIC AND ANAEROBIC Blood Culture adequate volume Performed at  [facility], [place] [address], [place], [place] [address]    Culture   Final    NO GROWTH < 24 HOURS Performed at [facility] [address] [place], [place] [address]    Report  Status PENDING  Incomplete  Culture, blood (routine x 2)     Status: None (Preliminary result)   Collection Time: 03/18/21 11:09 AM   Specimen: BLOOD  Result Value Ref Range Status   Specimen Description   Final    BLOOD BLOOD LEFT HAND Performed at [facility] [address], [place], [place] [address]    Special Requests   Final    BOTTLES DRAWN AEROBIC AND ANAEROBIC Blood Culture adequate volume Performed at [facility] [address], [place], [place] [address]    Culture   Final    NO GROWTH < 24 HOURS Performed at [facility] [address] [place], [place] [address]    Report Status PENDING  Incomplete     Labs: BNP (last 3 results) No results for input(s): BNP in the last 8760 hours. Basic Metabolic Panel: Recent Labs  Lab 03/16/21 2053 03/17/21 0343 03/18/21 0651 03/19/21 0348  NA 134* 136 138 137  K 3.7 3.7 3.5 3.8  CL 106 109 110 109  CO2 19* 20* 23 23  GLUCOSE 118* 154* 108* 113*  BUN 33* 28* 20 18  CREATININE 1.54* 1.52* 1.30* 1.17  CALCIUM 8.8* 8.8* 8.8* 8.7*  MG  --   --   --  1.8  PHOS  --   --   --  2.9   Liver Function Tests: Recent Labs  Lab 03/16/21 2053 03/18/21 0651 03/19/21 0348  AST 51* 17 25  ALT 73* 40 37  ALKPHOS 141* 144* 145*  BILITOT 1.0 0.8 0.5  PROT 7.1 6.5 6.5  ALBUMIN 3.2* 2.8* 2.8*   Recent Labs  Lab 03/16/21 2053  LIPASE 28   No results for input(s): AMMONIA in the last 168 hours. CBC: Recent Labs  Lab 03/16/21 2053 03/17/21 0343 03/18/21 0651 03/19/21 0348  WBC 21.0* 19.3* 14.2* 11.5*  NEUTROABS 17.9*  --  10.7* 8.4*  HGB 12.9* 12.4* 12.4* 12.0*  HCT 38.3* 37.8* 37.9* 36.4*  MCV 90.8 93.3 93.3 94.5  PLT 362 332 313 299   Cardiac Enzymes: No results for input(s): CKTOTAL, CKMB, CKMBINDEX, TROPONINI in the last 168 hours. BNP: Invalid input(s): POCBNP CBG: No results for input(s): GLUCAP in the last 168 hours. D-Dimer No results for input(s): DDIMER in  the last 72 hours. Hgb A1c No results for input(s): HGBA1C in the last 72 hours. Lipid Profile No results for input(s): CHOL, HDL, LDLCALC, TRIG, CHOLHDL, LDLDIRECT in the last 72 hours. Thyroid function studies No results for input(s): TSH, T4TOTAL, T3FREE, THYROIDAB in the last 72 hours.  Invalid input(s): FREET3 Anemia work up No results for input(s): VITAMINB12, FOLATE, FERRITIN, TIBC, IRON, RETICCTPCT in the last 72 hours. Urinalysis    Component Value Date/Time   COLORURINE YELLOW 03/16/2021 1846   APPEARANCEUR CLOUDY (A) 03/16/2021 1846   LABSPEC 1.015 03/16/2021 1846   PHURINE 6.5 03/16/2021 1846   GLUCOSEU NEGATIVE 03/16/2021 1846   GLUCOSEU NEGATIVE 03/06/2021 1448   HGBUR SMALL (A) 03/16/2021 1846   HGBUR negative 07/11/2009 0813   BILIRUBINUR NEGATIVE 03/16/2021 1846   KETONESUR NEGATIVE 03/16/2021 1846   PROTEINUR 30 (A) 03/16/2021 1846   UROBILINOGEN 0.2 03/06/2021 1448   NITRITE POSITIVE (A) 03/16/2021 1846  LEUKOCYTESUR LARGE (A) 03/16/2021 1846   Sepsis Labs Invalid input(s): PROCALCITONIN,  WBC,  LACTICIDVEN Microbiology Recent Results (from the past 240 hour(s))  Urine Culture     Status: Abnormal (Preliminary result)   Collection Time: 03/16/21  6:46 PM   Specimen: Urine, Random  Result Value Ref Range Status   Specimen Description   Final    URINE, RANDOM Performed at [facility], [address] [place], [place] [address]    Special Requests   Final    NONE Performed at [facility], [address] [place], [name] [address]    Culture (A)  Final    >=100,000 COLONIES/mL KLEBSIELLA PNEUMONIAE 60,000 COLONIES/mL PROTEUS MIRABILIS SUSCEPTIBILITIES TO FOLLOW Performed at [facility], [place] [address], [place], [place] [address]    Report Status PENDING  Incomplete   Organism ID, Bacteria KLEBSIELLA PNEUMONIAE (A)  Final      Susceptibility   Klebsiella pneumoniae - MIC*    AMPICILLIN >=32 RESISTANT Resistant      CEFAZOLIN <=4 SENSITIVE Sensitive     CEFEPIME <=0.12 SENSITIVE Sensitive     CEFTRIAXONE <=0.25 SENSITIVE Sensitive     CIPROFLOXACIN <=0.25 SENSITIVE Sensitive     GENTAMICIN <=1 SENSITIVE Sensitive     IMIPENEM <=0.25 SENSITIVE Sensitive     NITROFURANTOIN 64 INTERMEDIATE Intermediate     TRIMETH/SULFA <=20 SENSITIVE Sensitive     AMPICILLIN/SULBACTAM 4 SENSITIVE Sensitive     PIP/TAZO <=4 SENSITIVE Sensitive     * >=100,000 COLONIES/mL KLEBSIELLA PNEUMONIAE  Blood culture (routine x 2)     Status: Abnormal   Collection Time: 03/16/21  9:03 PM   Specimen: Right Antecubital; Blood  Result Value Ref Range Status   Specimen Description   Final    RIGHT ANTECUBITAL BLOOD Performed at [facility], [address] [place], [place] [address]    Special Requests   Final    Blood Culture adequate volume BOTTLES DRAWN AEROBIC AND ANAEROBIC Performed at [facility], [address] [place], [name] [address]    Culture  Setup Time   Final    AEROBIC BOTTLE ONLY GRAM NEGATIVE RODS CRITICAL RESULT CALLED TO, READ BACK BY AND VERIFIED WITH: PHARMD LEIGHANN P. 03/17/21@23 :41 BY TW Performed at [facility], [place] [address] [place], [place] [address]    Culture KLEBSIELLA PNEUMONIAE (A)  Final   Report Status 03/19/2021 FINAL  Final   Organism ID, Bacteria KLEBSIELLA PNEUMONIAE  Final      Susceptibility   Klebsiella pneumoniae - MIC*    AMPICILLIN >=32 RESISTANT Resistant     CEFAZOLIN <=4 SENSITIVE Sensitive     CEFEPIME <=0.12 SENSITIVE Sensitive     CEFTAZIDIME <=1 SENSITIVE Sensitive     CEFTRIAXONE <=0.25 SENSITIVE Sensitive     CIPROFLOXACIN <=0.25 SENSITIVE Sensitive     GENTAMICIN <=1 SENSITIVE Sensitive     IMIPENEM <=0.25 SENSITIVE Sensitive     TRIMETH/SULFA <=20 SENSITIVE Sensitive     AMPICILLIN/SULBACTAM 4 SENSITIVE Sensitive     PIP/TAZO <=4 SENSITIVE Sensitive     * KLEBSIELLA PNEUMONIAE  Blood Culture ID Panel (Reflexed)     Status:  Abnormal   Collection Time: 03/16/21  9:03 PM  Result Value Ref Range Status   Enterococcus faecalis NOT DETECTED NOT DETECTED Final   Enterococcus Faecium NOT DETECTED NOT DETECTED Final   Listeria monocytogenes NOT DETECTED NOT DETECTED Final   Staphylococcus species NOT DETECTED  NOT DETECTED Final   Staphylococcus aureus (BCID) NOT DETECTED NOT DETECTED Final   Staphylococcus epidermidis NOT DETECTED NOT DETECTED Final   Staphylococcus lugdunensis NOT DETECTED NOT DETECTED Final   Streptococcus species NOT DETECTED NOT DETECTED Final   Streptococcus agalactiae NOT DETECTED NOT DETECTED Final   Streptococcus pneumoniae NOT DETECTED NOT DETECTED Final   Streptococcus pyogenes NOT DETECTED NOT DETECTED Final   A.calcoaceticus-baumannii NOT DETECTED NOT DETECTED Final   Bacteroides fragilis NOT DETECTED NOT DETECTED Final   Enterobacterales DETECTED (A) NOT DETECTED Final    Comment: Enterobacterales represent a large order of gram negative bacteria, not a single organism. CRITICAL RESULT CALLED TO, READ BACK BY AND VERIFIED WITH: PHARMD LEIGHANN P. 03/17/21@23 :41 BY TW    Enterobacter cloacae complex NOT DETECTED NOT DETECTED Final   Escherichia coli NOT DETECTED NOT DETECTED Final   Klebsiella aerogenes NOT DETECTED NOT DETECTED Final   Klebsiella oxytoca NOT DETECTED NOT DETECTED Final   Klebsiella pneumoniae DETECTED (A) NOT DETECTED Final    Comment: CRITICAL RESULT CALLED TO, READ BACK BY AND VERIFIED WITH: PHARMD LEIGHANN P. 03/17/21@23 :41 BY TW    Proteus species NOT DETECTED NOT DETECTED Final   Salmonella species NOT DETECTED NOT DETECTED Final   Serratia marcescens NOT DETECTED NOT DETECTED Final   Haemophilus influenzae NOT DETECTED NOT DETECTED Final   Neisseria meningitidis NOT DETECTED NOT DETECTED Final   Pseudomonas aeruginosa NOT DETECTED NOT DETECTED Final   Stenotrophomonas maltophilia NOT DETECTED NOT DETECTED Final   Candida albicans NOT DETECTED NOT DETECTED  Final   Candida auris NOT DETECTED NOT DETECTED Final   Candida glabrata NOT DETECTED NOT DETECTED Final   Candida krusei NOT DETECTED NOT DETECTED Final   Candida parapsilosis NOT DETECTED NOT DETECTED Final   Candida tropicalis NOT DETECTED NOT DETECTED Final   Cryptococcus neoformans/gattii NOT DETECTED NOT DETECTED Final   CTX-M ESBL NOT DETECTED NOT DETECTED Final   Carbapenem resistance IMP NOT DETECTED NOT DETECTED Final   Carbapenem resistance KPC NOT DETECTED NOT DETECTED Final   Carbapenem resistance NDM NOT DETECTED NOT DETECTED Final   Carbapenem resist OXA 48 LIKE NOT DETECTED NOT DETECTED Final   Carbapenem resistance VIM NOT DETECTED NOT DETECTED Final    Comment: Performed at [facility], [address] [place], [place] [address]  Blood culture (routine x 2)     Status: Abnormal   Collection Time: 03/16/21  9:10 PM   Specimen: Left Antecubital; Blood  Result Value Ref Range Status   Specimen Description   Final    LEFT ANTECUBITAL BLOOD Performed at [facility] [place] [facility], [address] [place], [name] [address]    Special Requests   Final    Blood Culture adequate volume BOTTLES DRAWN AEROBIC AND ANAEROBIC Performed at [facility], [address] [place], [name] [address]    Culture  Setup Time   Final    GRAM NEGATIVE RODS AEROBIC BOTTLE ONLY CRITICAL VALUE NOTED.  VALUE IS CONSISTENT WITH PREVIOUSLY REPORTED AND CALLED VALUE.    Culture (A)  Final    KLEBSIELLA PNEUMONIAE SUSCEPTIBILITIES PERFORMED ON PREVIOUS CULTURE WITHIN THE LAST 5 DAYS. Performed at [facility], [place] [address], [place], [place] [address]    Report Status 03/19/2021 FINAL  Final  Resp Panel by RT-PCR (Flu A&B, Covid) Nasopharyngeal Swab     Status: None   Collection Time: 03/17/21  9:34 AM   Specimen: Nasopharyngeal Swab; Nasopharyngeal(NP) swabs in vial  transport medium  Result Value Ref Range Status   SARS Coronavirus 2 by RT PCR NEGATIVE NEGATIVE  Final    Comment: (NOTE) SARS-CoV-2 target nucleic acids are NOT DETECTED.  The SARS-CoV-2 RNA is generally detectable in upper respiratory specimens during the acute phase of infection. The lowest concentration of SARS-CoV-2 viral copies this assay can detect is 138 copies/mL. A negative result does not preclude SARS-Cov-2 infection and should not be used as the sole basis for treatment or other patient management decisions. A negative result may occur with  improper specimen collection/handling, submission of specimen other than nasopharyngeal swab, presence of viral mutation(s) within the areas targeted by this assay, and inadequate number of viral copies(<138 copies/mL). A negative result must be combined with clinical observations, patient history, and epidemiological information. The expected result is Negative.  Fact Sheet for Patients:  EntrepreneurPulse.com.au  Fact Sheet for Healthcare Providers:  IncredibleEmployment.be  This test is no t yet approved or cleared by the [place] FDA and  has been authorized for detection and/or diagnosis of SARS-CoV-2 by FDA under an Emergency Use Authorization (EUA). This EUA will remain  in effect (meaning this test can be used) for the duration of the COVID-19 declaration under Section 564(b)(1) of the Act, 21 U.S.C.section 360bbb-3(b)(1), unless the authorization is terminated  or revoked sooner.       Influenza A by PCR NEGATIVE NEGATIVE Final   Influenza B by PCR NEGATIVE NEGATIVE Final    Comment: (NOTE) The Xpert Xpress SARS-CoV-2/FLU/RSV plus assay is intended as an aid in the diagnosis of influenza from Nasopharyngeal swab specimens and should not be used as a sole basis for treatment. Nasal washings and aspirates are unacceptable for Xpert Xpress SARS-CoV-2/FLU/RSV testing.  Fact Sheet for Patients: EntrepreneurPulse.com.au  Fact Sheet for Healthcare  Providers: IncredibleEmployment.be  This test is not yet approved or cleared by the [place] FDA and has been authorized for detection and/or diagnosis of SARS-CoV-2 by FDA under an Emergency Use Authorization (EUA). This EUA will remain in effect (meaning this test can be used) for the duration of the COVID-19 declaration under Section 564(b)(1) of the Act, 21 U.S.C. section 360bbb-3(b)(1), unless the authorization is terminated or revoked.  Performed at [facility], [place] [address], [place], [place] [address]   Culture, blood (routine x 2)     Status: None (Preliminary result)   Collection Time: 03/18/21 11:04 AM   Specimen: BLOOD  Result Value Ref Range Status   Specimen Description   Final    BLOOD BLOOD RIGHT HAND Performed at [facility] [address], [place], [place] [address]    Special Requests   Final    BOTTLES DRAWN AEROBIC AND ANAEROBIC Blood Culture adequate volume Performed at [facility] [address], [place], [place] [address]    Culture   Final    NO GROWTH < 24 HOURS Performed at [facility] [address] [place], [place] [address]    Report Status PENDING  Incomplete  Culture, blood (routine x 2)     Status: None (Preliminary result)   Collection Time: 03/18/21 11:09 AM   Specimen: BLOOD  Result Value Ref Range Status   Specimen Description   Final    BLOOD BLOOD LEFT HAND Performed at [facility] [address] [place], [place] [address]    Special Requests   Final    BOTTLES DRAWN AEROBIC AND ANAEROBIC Blood Culture adequate volume Performed  at [facility], [place] [address] [place], [place] [address]    Culture   Final    NO GROWTH < 24 HOURS Performed at [facility] [address] [place], [place] [address]    Report Status PENDING  Incomplete     Time coordinating discharge: Over 30  minutes  SIGNED:   Taiven Greenley J [place] DO  [facility] 03/19/2021, 12:02 PM

## 2021-03-19 NOTE — Plan of Care (Signed)

## 2021-03-19 NOTE — Evaluation (Signed)
Occupational Therapy Evaluation Patient Details Name: Adrian Rios MRN: [id number] DOB: 08/30/1940 Today's Date: 03/19/2021   History of Present Illness Patient is an 81 year old male presents to [facility] ED from home with fever and right flank pain. treated by his primary physician last week for urinary tract infection that was diagnosed on January 5. Urine culture at that time showed ESBL E. coli. PMH includes bladder cancer s/p cystectomy with ileal conduit in April 2018, hx of recurrent UTI/pyelonephritis, sarcoidosis, chronic bilateral hydronephrosis   Clinical Impression   Patient lives at home with spouse, stays on main level of house. Patient is independent at baseline and does not present with any self care deficits at this time. No acute OT needed, will sign off.       Recommendations for follow up therapy are one component of a multi-disciplinary discharge planning process, led by the attending physician.  Recommendations may be updated based on patient status, additional functional criteria and insurance authorization.   Follow Up Recommendations  No OT follow up    Assistance Recommended at Discharge None     Functional Status Assessment  Patient has not had a recent decline in their functional status  Equipment Recommendations  None recommended by OT       Precautions / Restrictions Precautions Precautions: None Restrictions Weight Bearing Restrictions: No      Mobility Bed Mobility Overal bed mobility: Modified Independent                  Transfers Overall transfer level: Independent Equipment used: None                      Balance Overall balance assessment: No apparent balance deficits (not formally assessed)                                         ADL either performed or assessed with clinical judgement   ADL Overall ADL's : Independent;At baseline                                       General ADL  Comments: Patient able to participate in self care tasks assessed functional ambulation, trasnfer to chair and LB Dressing without any physical assistance.      Pertinent Vitals/Pain Pain Assessment Pain Assessment: No/denies pain     Hand Dominance  (did not specify)   Extremity/Trunk Assessment Upper Extremity Assessment Upper Extremity Assessment: Overall WFL for tasks assessed   Lower Extremity Assessment Lower Extremity Assessment: Defer to PT evaluation   Cervical / Trunk Assessment Cervical / Trunk Assessment: Normal   Communication Communication Communication: No difficulties   Cognition Arousal/Alertness: Awake/alert Behavior During Therapy: WFL for tasks assessed/performed Overall Cognitive Status: Within Functional Limits for tasks assessed                                                  Home Living Family/patient expects to be discharged to:: Private residence Living Arrangements: Spouse/significant other Available Help at Discharge: Family Type of Home: House Home Access: Ramped entrance     Home Layout: Two level;Able to live on main level with bedroom/bathroom  Bathroom Shower/Tub: Gaffer;Tub/shower unit   [employer]: Standard     Home Equipment: Grab bars - tub/shower          Prior Functioning/Environment Prior Level of Function : Independent/Modified Independent                        OT Problem List: Decreased activity tolerance         OT Goals(Current goals can be found in the care plan section) Acute Rehab OT Goals Patient Stated Goal: Home today OT Goal Formulation: All assessment and education complete, DC therapy   AM-PAC OT "6 Clicks" Daily Activity     Outcome Measure Help from another person eating meals?: None Help from another person taking care of personal grooming?: None Help from another person toileting, which includes using toliet, bedpan, or urinal?: None Help from  another person bathing (including washing, rinsing, drying)?: None Help from another person to put on and taking off regular upper body clothing?: None Help from another person to put on and taking off regular lower body clothing?: None 6 Click Score: 24   End of Session Nurse Communication: Mobility status  Activity Tolerance: Patient tolerated treatment well Patient left: in chair;with call bell/phone within reach;with family/visitor present  OT Visit Diagnosis: Other abnormalities of gait and mobility (R26.89)                Time: 3244-0102 OT Time Calculation (min): 12 min Charges:  OT General Charges $OT Visit: 1 Visit OT Evaluation $OT Eval Low Complexity: 1 Low  Delbert Phenix OT OT pager: [place] 03/19/2021, 12:24 PM

## 2021-03-20 ENCOUNTER — Telehealth: Payer: Self-pay

## 2021-03-20 LAB — URINE CULTURE: Culture: >=100000 — AB

## 2021-03-20 NOTE — Telephone Encounter (Signed)
Transition Care Management Unsuccessful Follow-up Telephone Call  Date of discharge and from where: [facility] 03-19-21 Dx: pyelonephritis   Attempts:  1st Attempt  Reason for unsuccessful TCM follow-up call:  No answer/busy- unable to leave vm   Transition Care Management Follow-up Telephone Call Date of discharge and from where: [facility] 03-19-21 Dx: pyelonephritis How have you been since you were released from the hospital? Doing really good but wake and tired  Any questions or concerns? No  Items Reviewed: Did the pt receive and understand the discharge instructions provided? Yes  Medications obtained and verified? Yes  Other? No  Any new allergies since your discharge? No  Dietary orders reviewed? Yes Do you have support at home? Yes   Home Care and Equipment/Supplies: Were home health services ordered? no If so, what is the name of the agency? na  Has the agency set up a time to come to the patient's home? not applicable Were any new equipment or medical supplies ordered?  No What is the name of the medical supply agency? na Were you able to get the supplies/equipment? no Do you have any questions related to the use of the equipment or supplies? No  Functional Questionnaire: (I = Independent and D = Dependent) ADLs: I  Bathing/Dressing- I  Meal Prep- I  Eating- I  Maintaining continence- I  Transferring/Ambulation- I  Managing Meds- I  Follow up appointments reviewed:  PCP Hospital f/u appt confirmed? Yes  Scheduled to see Dr Quay Burow on 03-26-21 @ 140pm. [facility] f/u appt confirmed? Yes  Scheduled to see Dr Dimas Millin on 04-02-21 @ 1030am. Are transportation arrangements needed? No  If their condition worsens, is the pt aware to call PCP or go to the Emergency Dept.? Yes Was the patient provided with contact information for the PCP's office or ED? Yes Was to pt encouraged to call back with questions or concerns? Yes

## 2021-03-23 ENCOUNTER — Encounter: Payer: Self-pay | Admitting: Internal Medicine

## 2021-03-23 LAB — CULTURE, BLOOD (ROUTINE X 2)
Culture: NO GROWTH
Culture: NO GROWTH
Special Requests: ADEQUATE
Special Requests: ADEQUATE
Special Requests: ADEQUATE

## 2021-03-23 NOTE — Progress Notes (Signed)
Subjective:    Patient ID: Adrian Rios, male    DOB: 1940/08/18, 81 y.o.   MRN: [id number]  This visit occurred during the SARS-CoV-2 public health emergency.  Safety protocols were in place, including screening questions prior to the visit, additional usage of staff PPE, and extensive cleaning of exam room while observing appropriate contact time as indicated for disinfecting solutions.     HPI The patient is here for follow up from the hospital.    Admitted 1/15 - 1/18 for pyelonephritis  Recommendations for Outpatient Follow-up:  Follow up with PCP in 1-2 weeks Follow-up with [facility] and infectious disease in 2 weeks Discharging on ciprofloxacin to complete antibiotic course for septicemia/UTI Follow-up urine culture susceptibilities for Proteus which was pending at time of discharge.  H/o bladder cancer s/p cystectomy w/ ileal conduit 2018 with recurrent UTIs.   He was on nitrofurantoin as an outpatient but two days after completing abx went to the ED with fever and right flank pain.    WBC 21, lactic acid 0.9, Cr 1.54, BUn 33, Ct abd/pelvis stable hydronephrosis w/ no obstructing stones and right pyelonephritis.  He was started on meropenem.    Klebsiella septicemia, Klebsiella pneumonia/proteus UTI, right pyelonephritis: Ucx pos Klebsiella pneumonia/proteus  Blood cx positive for Klebsiella on 1/15, repeat cultures from 1/17 neg Initially started on meropenem - completed 3 days ID consulted, urology consulted Dose of fosfomycin following discharge Discharged on cipro 500 mg BID x 7 additional days Outpt f/u w/ [facility]/ID  Acute renal failure -  Cr elevated on admission - 1.54 - baseline is 0.9 Likely prerenal azotemia in setting of dehydration vs ATN from UTI Cr improved to 1.17 with abx  Chronic b/l hydronephrosis, h/o cystectomy w/ ileal conduit, recurrent UTI: To f/u with [place] [facility] - Dr Dimas Millin and [facility]  Anxiety, depression: Continued on  effexor  The first 2 days he was home from the hospital he felt good.  Today he feels a little bit more tired.  Today is his last day of antibiotics.  He denies any fevers.  His urine looks clear.  He is not having any abdominal pain.  His biggest concern on is trying to prevent these bladder infections.  He and his wife.  He is doing fine and now just concerned about how to prevent this.  They are unsure if they should see the urologist first for infectious disease.  He has an appointment with infectious disease on Monday and he has an appointment early next month with his urologist.   Medications and allergies reviewed with patient and updated if appropriate.  Patient Active Problem List   Diagnosis Date Noted   Right flank pain 03/17/2021   CKD (chronic kidney disease) stage 3, GFR 30-59 ml/min (HCC) 03/17/2021   Leukocytosis 03/17/2021   Pyelonephritis 03/16/2021   UTI (urinary tract infection) 11/24/2020   Sepsis ([place]) 11/24/2020   History of ESBL E. coli infection 11/24/2020   Hydronephrosis 11/24/2020   Cough 09/06/2020   Low back pain 08/03/2020   Bilateral leg edema 02/16/2020   Chronic pain of both shoulders 08/04/2019   COVID-19 02/28/2019   Dizziness 06/21/2018   Gait disorder 01/24/2018   Left hip pain 11/11/2017   Abdominal wall hernia 05/26/2017   Recurrent urinary tract infection 11/13/2016   Chronic diastolic CHF (congestive heart failure) ([place]) 11/12/2016   Fatigue 08/19/2016   History of bladder cancer 06/18/2016   History of prostate cancer 06/18/2016  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[place]) 06/12/2016   Carotid bruit 02/17/2016   Prediabetes 08/20/2015   Glaucoma 02/14/2015   Varicose veins of lower extremities with other complications 04/54/0981   Asthma 10/27/2011   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     ADVAIR DISKUS 250-50 MCG/ACT AEPB Inhale 1 puff into the lungs 2 (two) times daily.     albuterol (VENTOLIN HFA) 108 (90 Base) MCG/ACT inhaler Inhale 2 puffs into the lungs every 6 (six) hours as needed for wheezing or shortness of breath. 1 each 8   ciprofloxacin (CIPRO) 500 MG tablet Take 1 tablet (500 mg total) by mouth 2 (two) times daily for 7 days. 14 tablet 0   Fluticasone-Salmeterol 232-14 MCG/ACT AEPB Inhale 1 puff into the lungs in the morning and at bedtime. (Patient taking differently: Inhale 1 puff into the lungs at bedtime.) 1 each 11   latanoprost (XALATAN) 0.005 % ophthalmic solution Place 1 drop into the right eye at bedtime.     magnesium oxide (MAG-OX) 400 MG tablet Take 400 mg by mouth daily with supper.     Misc Natural Products (GLUCOSAMINE CHOND DOUBLE STR PO) Take 1 tablet by mouth 2 (two) times daily.     montelukast (SINGULAIR) 10 MG tablet Take 1 tablet (10 mg total) by mouth at bedtime. 90 tablet 2   triamcinolone (NASACORT) 55 MCG/ACT AERO nasal inhaler Place 1 spray into the nose at bedtime as needed (congestion/nasal drainage).     venlafaxine (EFFEXOR) 75 MG tablet Take 1.5 tablets (112.5 mg total) by mouth daily. (Patient taking differently: Take 112.5 mg by mouth every morning.) 135 tablet 1   No current facility-administered medications on file prior to visit.    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    s/p  MOH's nasal --  pre-melanoma   History of squamous cell carcinoma excision    several times   Hyperlipidemia    Pre-diabetes    Premature ventricular contractions (PVCs) ([facility])    RAD (reactive airway disease)    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  s/p  deep neck lymph node bx's and lung bx's   Sensorineural hearing loss (SNHL) of both ears    Sepsis ([place]) 09/2016   UTI (urinary tract infection)  08/2016   Wears glasses    Wears hearing aid    bilateral    Past Surgical History:  Procedure Laterality Date   APPENDECTOMY  2008   CARDIOVASCULAR STRESS TEST  05/27/2010   normal nuclear study w/ no ischemia/  normal LV function and wall motion , ef 60%   CYSTOSCOPY WITH INJECTION N/A 06/12/2016   Procedure: CYSTOSCOPY WITH INJECTION OF INDOCYANINE GREEN DYE;  Surgeon: Alexis Frock, MD;  Location: [facility];  Service: Urology;  Laterality: N/A;   DEEP NECK LYMPH NODE BIOPSY / EXCISION  1970's   and Bronchoscopy w/ bx's ( dx Sarcoidosis)   ILEOSTOMY     IR NEPHROSTOMY PLACEMENT LEFT  11/18/2016   MOHS SURGERY  2013 approx.    nasal; pre melanoma   PARS PLANA VITRECTOMY Right 2007   repair macular pucker   TONSILLECTOMY AND ADENOIDECTOMY  child   TRANSURETHRAL RESECTION OF BLADDER TUMOR N/A 04/06/2016   Procedure: CYSTOSCOPY TRANSURETHRAL RESECTION OF BLADDER TUMORS (TURBT);  Surgeon: Carolan Clines, MD;  Location: [facility];  Service: Urology;  Laterality: N/A;   TRANSURETHRAL RESECTION OF PROSTATE      Social History   Socioeconomic History   Marital status:  Married    Spouse name: Not on file   Number of children: Not on file   Years of education: Not on file   Highest education level: Not on file  Occupational History   Not on file  Tobacco Use   Smoking status: Never   Smokeless tobacco: Never  Vaping Use   Vaping Use: Never used  Substance and Sexual Activity   Alcohol use: No   Drug use: No   Sexual activity: Not Currently  Other Topics Concern   Not on file  Social History Narrative   Not on file   Social Determinants of Health   Financial Resource Strain: Low Risk    Difficulty of Paying Living Expenses: Not hard at all  Food Insecurity: No Food Insecurity   Worried About Charity fundraiser in the Last Year: Never true   [place] in the Last Year: Never true  Transportation Needs: No Transportation Needs   Lack of Transportation (Medical): No   Lack of Transportation (Non-Medical): No  Physical Activity: Sufficiently Active   Days of Exercise per Week: 5 days   Minutes of Exercise per Session: 30 min  Stress: No Stress Concern Present   Feeling of Stress : Not at all  Social Connections: Socially Integrated   Frequency of Communication with Friends and Family: More than three times a week   Frequency of Social Gatherings with Friends and Family: More than three times a week   Attends Religious Services: More than 4 times per year   Active Member of [employer] or Organizations: Yes   Attends Music therapist: More than 4 times per year   Marital Status: Married    Family History  Problem Relation Age of Onset   Stroke Mother        in her 63s   Breast cancer Sister    Heart disease Maternal Grandmother 12       MI    Breast cancer Paternal Grandmother    Heart disease Paternal Grandfather 38       MI   Stroke Brother    Healthy Father    Diabetes Neg Hx     Review of Systems  Constitutional:  Positive for fatigue. Negative for fever.  Gastrointestinal:  Negative for abdominal pain and  nausea.  Genitourinary:  Negative for hematuria.       Urine looks clear  Neurological:  Negative for light-headedness and headaches.      Objective:   Vitals:   03/26/21 1340  BP: 102/60  Pulse: 86  Temp: 98.1 F (36.7 C)  SpO2: 96%   BP Readings from Last 3 Encounters:  03/26/21 102/60  03/19/21 122/72  01/10/21 115/63   Wt Readings from Last 3 Encounters:  03/26/21 167 lb (75.8 kg)  03/17/21 172 lb 9.9 oz (78.3 kg)  01/10/21 169 lb 1.5 oz (76.7 kg)   Body mass index is 26.16 kg/m.   Physical Exam    Constitutional: Appears well-developed and well-nourished. No distress.  HENT:  Head: Normocephalic and atraumatic.  Skin: Skin is warm and dry. Not diaphoretic.  Psychiatric: Normal mood and affect. Behavior is normal.   Lab Results  Component Value Date   WBC 11.5 (H) 03/19/2021   HGB 12.0 (L) 03/19/2021   HCT 36.4 (L) 03/19/2021   PLT 299 03/19/2021   GLUCOSE 113 (H) 03/19/2021   CHOL 146 08/02/2020   TRIG 130.0 08/02/2020   HDL 41.50 08/02/2020   LDLCALC 79 08/02/2020   ALT 37 03/19/2021   AST 25 03/19/2021   NA 137 03/19/2021   K 3.8 03/19/2021   CL 109 03/19/2021   CREATININE 1.17 03/19/2021   BUN 18 03/19/2021   CO2 23 03/19/2021   TSH 1.17 10/28/2018   PSA 0.29 07/11/2009   INR 0.99 10/25/2016   HGBA1C 5.9 08/02/2020   MICROALBUR 0.3 06/29/2006    CT Renal Stone Study CLINICAL DATA:  Right flank pain.  EXAM: CT ABDOMEN AND PELVIS WITHOUT CONTRAST  TECHNIQUE: Multidetector CT imaging of the abdomen and pelvis was performed following the standard protocol without IV contrast.  RADIATION DOSE REDUCTION: This exam was performed according to the departmental dose-optimization program which includes automated exposure control, adjustment of the mA and/or kV according to patient size and/or use of iterative reconstruction technique.  COMPARISON:  January 05, 2021  FINDINGS: Lower chest: No acute abnormality.  Hepatobiliary: No focal  liver abnormality is seen. No gallstones, gallbladder wall thickening, or biliary dilatation.  Pancreas: Unremarkable. No pancreatic ductal dilatation or surrounding inflammatory changes.  Spleen: Normal in size without focal abnormality.  Adrenals/Urinary Tract: Adrenal glands are unremarkable. Kidneys are normal in size. A 3.2 cm cyst is seen along the anterior aspect of the mid to lower left kidney. A 2.1 cm exophytic cyst is seen along the posterior aspect of the mid to lower right kidney. 2 mm and 4 mm nonobstructing renal calculi are seen within the mid and lower right kidney. Moderate to marked severity bilateral hydronephrosis and hydroureter are noted, without evidence of obstructing renal calculi. The urinary bladder is surgically absent.  Stomach/Bowel: Stomach is within normal limits. The appendix is surgically absent. Surgically anastomosed bowel is seen within the right lower quadrant. No evidence of bowel dilatation. Noninflamed diverticula are seen throughout the sigmoid colon.  Vascular/Lymphatic: Aortic atherosclerosis. No enlarged abdominal or pelvic lymph nodes.  Reproductive: The prostate gland is surgically absent.  Other: A right lower quadrant ostomy site is seen.  No abdominopelvic ascites.  Musculoskeletal: No acute or significant osseous findings.  IMPRESSION: 1. Findings consistent with transurethral resection of the urinary bladder, with predominant stable moderate to marked severity bilateral hydronephrosis and hydroureter, in the absence of obstructing renal calculi. 2. Subcentimeter nonobstructing right renal calculi. 3. Sigmoid  diverticulosis.  Aortic Atherosclerosis (ICD10-I70.0).  Electronically Signed   By: Virgina Norfolk M.D.   On: 03/16/2021 21:50     Assessment & Plan:    Pyelonephritis: Today was his last day of antibiotics No urinary symptoms to suggest persistent infection   Recurrent urinary tract infections,  status post ileal conduit: Has been experiencing recurrent urinary tract infections, likely result of the ileal conduit His main focus is prevention of the infections To see urology next month To see infectious disease early next week He is interested in considering low-dose daily medication for prevention and will discuss with both of them   Anxiety, depression: Chronic Continue Effexor 112 mcg daily  See Problem List for Assessment and Plan of chronic medical problems.

## 2021-03-26 ENCOUNTER — Ambulatory Visit (INDEPENDENT_AMBULATORY_CARE_PROVIDER_SITE_OTHER): Payer: Medicare Other | Admitting: Internal Medicine

## 2021-03-26 ENCOUNTER — Other Ambulatory Visit: Payer: Self-pay

## 2021-03-26 VITALS — BP 102/60 | HR 86 | Temp 98.1°F | Ht 67.0 in | Wt 167.0 lb

## 2021-03-26 DIAGNOSIS — N12 Tubulo-interstitial nephritis, not specified as acute or chronic: Secondary | ICD-10-CM | POA: Diagnosis not present

## 2021-03-26 DIAGNOSIS — F419 Anxiety disorder, unspecified: Secondary | ICD-10-CM | POA: Diagnosis not present

## 2021-03-26 DIAGNOSIS — N390 Urinary tract infection, site not specified: Secondary | ICD-10-CM

## 2021-03-26 DIAGNOSIS — F32A Depression, unspecified: Secondary | ICD-10-CM | POA: Diagnosis not present

## 2021-03-31 DIAGNOSIS — N10 Acute tubulo-interstitial nephritis: Secondary | ICD-10-CM | POA: Diagnosis not present

## 2021-04-01 ENCOUNTER — Encounter: Payer: Self-pay | Admitting: Internal Medicine

## 2021-04-02 DIAGNOSIS — N12 Tubulo-interstitial nephritis, not specified as acute or chronic: Secondary | ICD-10-CM | POA: Diagnosis not present

## 2021-04-02 DIAGNOSIS — Z9049 Acquired absence of other specified parts of digestive tract: Secondary | ICD-10-CM | POA: Diagnosis not present

## 2021-04-02 DIAGNOSIS — N136 Pyonephrosis: Secondary | ICD-10-CM | POA: Diagnosis not present

## 2021-04-02 DIAGNOSIS — C678 Malignant neoplasm of overlapping sites of bladder: Secondary | ICD-10-CM | POA: Diagnosis not present

## 2021-04-02 DIAGNOSIS — Z87440 Personal history of urinary (tract) infections: Secondary | ICD-10-CM | POA: Diagnosis not present

## 2021-04-02 DIAGNOSIS — C61 Malignant neoplasm of prostate: Secondary | ICD-10-CM | POA: Diagnosis not present

## 2021-04-02 DIAGNOSIS — N5232 Erectile dysfunction following radical cystectomy: Secondary | ICD-10-CM | POA: Diagnosis not present

## 2021-04-02 DIAGNOSIS — L24B3 Irritant contact dermatitis related to fecal or urinary stoma or fistula: Secondary | ICD-10-CM | POA: Diagnosis not present

## 2021-04-21 ENCOUNTER — Ambulatory Visit (INDEPENDENT_AMBULATORY_CARE_PROVIDER_SITE_OTHER): Payer: Medicare Other | Admitting: *Deleted

## 2021-04-21 DIAGNOSIS — N390 Urinary tract infection, site not specified: Secondary | ICD-10-CM

## 2021-04-21 DIAGNOSIS — J4520 Mild intermittent asthma, uncomplicated: Secondary | ICD-10-CM

## 2021-04-21 DIAGNOSIS — Z8551 Personal history of malignant neoplasm of bladder: Secondary | ICD-10-CM

## 2021-04-21 NOTE — Patient Instructions (Signed)
Visit [place], thank you for taking time to talk with me today. Please don't hesitate to contact me if I can be of assistance to you before our next scheduled telephone appointment.  Below are the goals we discussed today:  Patient Self-Care Activities: Patient Adrian Rios will: Take medications as prescribed Attend all scheduled provider appointments Call pharmacy for medication refills Call provider office for new concerns or questions Continue to follow heart healthy, low salt, low cholesterol diet Continue to stay as active as possible; keep up the great work walking for exercise when the weather is nice Continue to take effort to prevent falls Please read over the attached information about use of probiotics when you are taking maintenance (daily) antibiotics  Our next scheduled telephone follow up visit/ appointment with care management team member is scheduled on:   Thursday, August 07, 2021 at 11:15 am- This is a PHONE [place] appointment  If you need to cancel or re-schedule our visit, please call [phone] and our care guide team will be happy to assist you.   I look forward to hearing about your progress.   [name], RN, BSN, [place] [phone]: direct office  If you are experiencing a Mental Health or [place] or need someone to talk to, please  call the Suicide and Crisis Lifeline: 988 call the [place] National Suicide Prevention Lifeline: (684)790-5237 or TTY: 267-447-8185 TTY 505-056-8994) to talk to a trained counselor call 1-800-273-TALK (toll free, 24 hour hotline) go to [facility] [address], [place] [phone]) call 911   The patient verbalized understanding of instructions, educational materials, and care plan provided today and agreed to receive a mailed copy of patient instructions, educational materials, and care plan    Probiotics Probiotics are the good bacteria and yeasts that live in your body and keep your digestive system healthy. Probiotics also help your body's defense system (immune system) and protect your body against the growth of harmful bacteria. Your health care provider may recommend taking a probiotic if you are taking antibiotics or have certain medical conditions, such as: Diarrhea. Constipation. Irritable bowel syndrome. Lung infections. Yeast infections. Acne, eczema, and other skin conditions. Frequent urinary tract infections. What affects the balance of bacteria in my body? The balance of good bacteria in your body can be affected by: Antibiotic medicines. These medicines treat infections caused by bacteria. Unfortunately, they may kill the good bacteria in your body as well as the bad bacteria. Certain medical conditions. Conditions related to an imbalance of bacteria include: Stomach and intestine (gastrointestinal) infections. Lung infections. Skin infections. Vaginal infections. Inflammatory bowel diseases. Stomach ulcers (gastric ulcers). Tooth decay and gum disease (periodontal disease). Stress. Poor diet. What type of probiotic is right for me? Probiotics contain different types of bacteria (strains). Strains commonly found in probiotics include: Lactobacillus. Saccharomyces. Bifidobacterium. Specific strains have been shown to be more effective for certain health conditions. Ask your health care provider which strain or strains you should use and how often. Probiotics come in many different forms, strain combinations, and strengths. Some may need to be refrigerated. Always read the label for storage and usage instructions. Certain foods, such as yogurt, contain probiotics. Probiotics can also be bought as a supplement at a pharmacy, health food store, or grocery store. Talk to your health care provider before starting any supplement. What are the side effects of  probiotics? Some people have side effects  when taking probiotics. Side effects are usually temporary and may include: Gas. Bloating. Cramping. Serious side effects are rare. Follow these instructions at home:  If you are taking probiotics with antibiotics: Wait at least 2 hours between taking your medicine and the probiotic. Eat foods high in fiber, such as whole grains, beans, and vegetables. These foods can help good bacteria grow. Avoid certain foods as told by your health care provider. Summary Probiotics are the good bacteria and yeasts that live in your body and keep you and your digestive system healthy. Certain foods, such as yogurt, contain probiotics. Probiotics can be taken as supplements. They can be bought at a pharmacy, health food store, or grocery store. They come in many different forms, strain combinations, and strengths. Be sure to talk with your health care provider before taking a probiotic supplement. This information is not intended to replace advice given to you by your health care provider. Make sure you discuss any questions you have with your health care provider. Document Revised: 11/05/2017 Document Reviewed: 03/03/2017 Elsevier Patient Education  2022 [employer].  Preventing MDRO Infections Multidrug-resistant organisms (MDRO) are bacteria that have become resistant to antibiotic medicines. This means that antibiotics cannot stop the bacteria from growing. Types of MDRO include: Methicillin/oxacillin-resistant Staphylococcus aureus (MRSA). Vancomycin-resistant enterococci (VRE). Extended-spectrum beta-lactamases (ESBLs). Clostridioides difficile (C. difficile or C. diff). Multidrug-resistant tuberculosis (MDR TB). Penicillin-resistant Streptococcus pneumoniae (PRSP). Carbapenem-resistant Enterobacterales (CRE). Drug-resistant Streptococcus pneumoniae (DRSP). How can this condition affect me? Everyone has good and bad bacteria in his or her body, such  as in the stomach or on the skin. Good bacteria help protect the body from infection. However, when you take an antibiotic medicine, it may kill both the good and bad bacteria. This allows medicine-resistant bacteria to grow. Infections caused by MDRO can be difficult to treat. It is important to follow certain safety measures to prevent the spread of MDRO. What increases the risk? You are more likely to develop MDRO infections if: You were treated with an antibiotic medicine for a long time. You have been in the hospital for a long time. You recently had major surgery, such as chest or abdominal surgery. You have a weakened disease-fighting system (immune system). This may be caused by an illness, a long-term (chronic) condition, or a medical treatment. You have a catheter that has stayed in for a long time, such as a urinary catheter or vascular access device. MDRO are usually spread through hands that have the germs on them (are contaminated). MDRO may also be spread through: Medical equipment that was not cleaned properly. Shared personal items, such as razors or towels. Contaminated surfaces, such as a bathroom counter or sink. Undercooked or raw meat. Animals treated with antibiotics may have medicine-resistant bacteria. Water or vegetables contaminated with animal feces. How is this treated? MDRO infections are usually treated with antibiotic medicines. Treatment depends on the type of MDRO you have. MDRO infections are difficult to treat, and you may need to be hospitalized. Depending on how severe your infection is, you may need other treatments, such as: IV antibiotics. High-dose antibiotics. More than one antibiotic. Antibiotics that you breathe in (inhaled antibiotics), if you have pneumonia. A machine to help you breathe (ventilator). What actions can I take to prevent this? Medicines Take antibiotic medicines only when needed. Do not take antibiotic medicines for viral  infections such as the common cold. If you were prescribed an antibiotic medicine, take it as told by your health care provider. Do  not stop taking the antibiotic even if you start to feel better. Do not share antibiotic medicines with others. Food handling Practice safe food handling. This includes: Washing all fruits and vegetables. Washing all utensils that have come in contact with raw meat. Keeping a separate cutting board for raw meat. Cooking meat thoroughly. All poultry (including chicken and [place]) should be cooked to at least 165F (74C). Ground beef, pork, or lamb should be cooked to at least 160F (71C), and whole beef, pork, or lamb should be cooked to at least 145F (63C). Washing your hands with soap and warm water for at least 20 seconds before and after cooking, especially after handling raw meat. General information  Do not share personal items, such as bath towels or razors. Wash your hands regularly with soap and warm water for at least 20 seconds. If soap and water are not available, use hand sanitizer. Clean and disinfect surfaces that are touched often. Use solutions or products that contain bleach. Do this on a regular basis. Keep all wounds clean and dry. Follow your health care provider's instructions about how to care for any wounds you have. If you have a catheter, care for it as told by your health care provider. What hospitals are doing: Encouraging staff, patients, and visitors to wash their hands often with soap and warm water and to use hand sanitizer when soap and water are not available. Taking extra steps to prevent infection (contact precautions) with patients who are infected with MDRO. Contact precautions include: Having all health care workers and visitors wash their hands before and after leaving the room. Having all health care workers and visitors wear a gown and gloves while in the room, and asking them to throw away the gown and gloves before  leaving the room. Prescribing antibiotic medicines only when they are needed. Overprescribing antibiotic medicines can help the spread of MDRO. Keeping patients with MDRO in a room by themselves (isolation) or with other patients who are already infected with MDRO. Carefully cleaning and disinfecting hospital rooms and equipment. Improving communication about which patients are infected with MDRO or which patients have been infected in the past. Closely monitoring and tracking MDRO infections. Educating staff and patients about the signs of infection. Removing catheters or vascular access as soon as they are no longer necessary. What visitors can do: Although it is rare, visitors can be infected with MDRO. To prevent the spread, visitors should: Wash their hands with soap and warm water for at least 20 seconds before and after visiting you. If soap and water are not available, they can use hand sanitizer. Ask your health care provider if visitors need to wear gloves and gowns when they visit you. If they do need to wear gloves and gowns, make sure they throw them away before leaving your room. Where to find more information Centers for Disease Control and Prevention: [employer]cy Summary Multidrug-resistant organisms (MDRO) are bacteria that have become resistant to antibiotic medicines. You are more likely to be infected with a MDRO if you have been taking an antibiotic medicine for a long time or have been hospitalized for a long time. If you were prescribed an antibiotic medicine, take it exactly as told by your health care provider. Wash your hands regularly with soap and warm water for at least 20 seconds. If soap and water are not available, use hand sanitizer. Ask your health care provider whether your visitors need to wear gloves and gowns when visiting you. This information  is not intended to replace advice given to you by your health care provider. Make sure you discuss any questions you  have with your health care provider. Document Revised: 02/21/2020 Document Reviewed: 02/21/2020 Elsevier Patient Education  2022 [employer].

## 2021-04-21 NOTE — Chronic Care Management (AMB) (Signed)
Chronic Care Management   CCM RN Visit Note  04/21/2021 Name: Adrian Rios MRN: [id number] DOB: 1940-03-29  Subjective: Adrian Rios is a 81 y.o. year old male who is a primary care patient of Burns, Claudina Lick, MD. The care management team was consulted for assistance with disease management and care coordination needs.    Engaged with patient by telephone for follow up visit in response to provider referral for case management and/or care coordination services.   Consent to Services:  The patient was given information about Chronic Care Management services, agreed to services, and gave verbal consent prior to initiation of services.  Please see initial visit note for detailed documentation.  Patient agreed to services and verbal consent obtained.   Assessment: Review of patient past medical history, allergies, medications, health status, including review of consultants reports, laboratory and other test data, was performed as part of comprehensive evaluation and provision of chronic care management services.   SDOH (Social Determinants of Health) assessments and interventions performed:  SDOH Interventions    Flowsheet Row Most Recent Value  SDOH Interventions   Food Insecurity Interventions Intervention Not Indicated  [Denies food insecurity]  Housing Interventions Intervention Not Indicated  Transportation Interventions Intervention Not Indicated  [Patient reports he continues to drive self]     CCM Care Plan  Allergies  Allergen Reactions   Ivp Dye [Iodinated Contrast Media] Shortness Of Breath, Itching and Palpitations    "eyes itching,  Heart racing,  Effected breathing" 10-27-16 pt with 13 hr pre-meds for CT without any reaction-kj   Metrizamide Shortness Of Breath, Itching and Palpitations     "eyes itching,  Heart racing,  Effected breathing" 10-27-16 pt with 13 hr pre-meds for CT without any reaction-kj   Shellfish Allergy Hives, Shortness Of Breath and Itching    Mostly  crab   Gabapentin Other (See Comments)    REACTION: dizziness and flushing   Niacin-Lovastatin Er Other (See Comments)    Did not feel good on medication   Gadolinium Hives   Adhesive [Tape] Rash    Use paper tape   Outpatient Encounter Medications as of 04/21/2021  Medication Sig   methenamine (HIPREX) 1 g tablet Take 1 g by mouth 2 (two) times daily with a meal.   acetaminophen (TYLENOL) 500 MG tablet Take 1,000 mg by mouth every 6 (six) hours as needed for headache or fever (pain).   ADVAIR DISKUS 250-50 MCG/ACT AEPB Inhale 1 puff into the lungs 2 (two) times daily.   albuterol (VENTOLIN HFA) 108 (90 Base) MCG/ACT inhaler Inhale 2 puffs into the lungs every 6 (six) hours as needed for wheezing or shortness of breath.   Fluticasone-Salmeterol 232-14 MCG/ACT AEPB Inhale 1 puff into the lungs in the morning and at bedtime. (Patient taking differently: Inhale 1 puff into the lungs at bedtime.)   latanoprost (XALATAN) 0.005 % ophthalmic solution Place 1 drop into the right eye at bedtime.   magnesium oxide (MAG-OX) 400 MG tablet Take 400 mg by mouth daily with supper.   Misc Natural Products (GLUCOSAMINE CHOND DOUBLE STR PO) Take 1 tablet by mouth 2 (two) times daily.   montelukast (SINGULAIR) 10 MG tablet Take 1 tablet (10 mg total) by mouth at bedtime.   triamcinolone (NASACORT) 55 MCG/ACT AERO nasal inhaler Place 1 spray into the nose at bedtime as needed (congestion/nasal drainage).   venlafaxine (EFFEXOR) 75 MG tablet Take 1.5 tablets (112.5 mg total) by mouth daily. (Patient taking differently: Take 112.5 mg by  mouth every morning.)   No facility-administered encounter medications on file as of 04/21/2021.   Patient Active Problem List   Diagnosis Date Noted   Right flank pain 03/17/2021   CKD (chronic kidney disease) stage 3, GFR 30-59 ml/min (HCC) 03/17/2021   Pyelonephritis 03/16/2021   UTI (urinary tract infection) 11/24/2020   Sepsis ([place]) 11/24/2020   History of ESBL E. 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   PREMATURE VENTRICULAR CONTRACTIONS 04/29/2010   ERECTILE DYSFUNCTION 04/23/2010   [place] DISEASE, LUMBOSACRAL SPINE 02/06/2010   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   Depression 04/12/2008   Allergic rhinitis 04/12/2008   Hyperlipidemia 10/05/2006   Sarcoidosis ([place]) 07/05/2006   Conditions to be addressed/monitored:  Asthma and bladder CA with recurrent UTI in setting of ileal conduit  Care Plan : RN Care Manager Plan of Care  Updates made by Adrian Royalty, RN since 04/21/2021 12:00 AM     Problem: Chronic Disease Management Needs   Priority: High     Long-Range Goal: Ongoing adherence to established plan of care for long term chronic disease management   Start Date: 02/13/2021  Expected End Date: 02/13/2022  Priority: High  Note:   Current Barriers:  Chronic Disease Management support and education needs related to asthma, recurrent UTI Recent hospital visit for recurrent UTI in setting of bladder CA with ilieal conduit, January 15-18, 2023  RNCM Clinical Goal(s):  Patient will demonstrate ongoing health management independence as evidenced by adherence to plan of care for self- management of asthma,  recurrent UTI        through collaboration with RN Care manager, provider, and care team.   Interventions: 1:1 collaboration with primary care provider regarding development and update of comprehensive plan of care as evidenced by provider attestation and co-signature Inter-disciplinary care team collaboration (see longitudinal plan of care) Evaluation of current treatment plan related to  self management and patient's adherence to plan as established by provider 04/21/21: Reviewed recent hospitalization with patient: he reports doing "much better;" states has not had any signs/ symptoms recurrent UTI; reinforced previously provided education around signs/ symptoms UTI development along with corresponding action plan: patient verbalizes good understanding of same Reviewed recent PCP office visit 03/26/21 and ([facility]) ID provider office visit 03/31/21: patient verbalizes good understanding of post-office visit instructions; confirms he is taking recently prescribed maintenance antibiotic- methenamine 1 G QD; patient is asking if he should continue taking OTC pro-biotics-- provided education around purpose of pro-biotic in setting of daily maintenance antibiotic, in prevention of complications from same- he states he will continue taking-- printed educational information also placed in mail to patient; newly prescribed maintenance antibiotic added to patient's medication list according to patient report today SDOH updated- no new/ unmet concerns identified Pain  assessment updated: denies acute/ chronic pain Falls assessment updated: denies new/ recent falls, reports feels "steady on feet" with ambulation, confirms he does not need to use assistive devices  Asthma: (Status: 04/21/21: Goal on Track (progressing): YES.) Long Term Goal  Advised patient to track and manage Asthma triggers;  Advised patient to self assesses Asthma action plan zone and make appointment with provider if in the yellow zone for 48  hours without improvement; Provided education about and advised patient to utilize infection prevention strategies to reduce risk of respiratory infection; Discussed the importance of adequate rest and management of fatigue with Asthma; Discussed with patient current clinical condition/ breathing status: he denies problems, concerns, issues around breathing status; states "breathing is fine" Denies issues/ concerns around medications, reports ongoing adherence to all prescribed medications Denies issues today with previously reported swallowing issues: reports "appetite is fine, swallowing seems okay" Confirmed patient continues to try and stay as active "as possible" tolerates well; positive reinforcement provided with encouragement to continue efforts  Recurrent UTI in setting of history of bladder CA with ileal conduit  (Status: 04/21/21: Goal on Track (progressing): YES.) Long Term Goal  Evaluation of current treatment plan related to  Recurrent UTI ,  self-management and patient's adherence to plan as established by provider. Discussed plans with patient for ongoing care management follow up and provided patient with direct contact information for care management team Confirmed patient is followed regularly by urology and infection disease providers at [facility]-- reports he is also scheduled to see "kidney stone specialist" at [facility] on [facility]/06/21 Confirmed wife assists as indicated/ needed for management of ileal conduit- patient reports managing well Initiated education around signs/ symptoms UTI: patient has good baseline understanding of same; reports action plan of "seeks advice/ care immediately" if he notices signs/ symptoms UTI; he adds today that he has discussed with PCP new plan of action to immediately take urine sample to office if he starts to develop signs/ symptoms UTI Confirmed patient attempts to follow low salt diet: for prevention/ management of possible kidney stones  Patient  Goals/Self-Care Activities: As evidenced by review of EHR, collaboration with care team, and patient reporting during CCM RN CM outreach,  Patient [place] will: Take medications as prescribed Attend all scheduled provider appointments Call pharmacy for medication refills Call provider office for new concerns or questions Continue to follow heart healthy, low salt, low cholesterol diet Continue to stay as active as possible; keep up the great work walking for exercise when the weather is nice Continue to take effort to prevent falls Please read over the attached information about use of probiotics when you are taking maintenance (daily) antibiotics    Plan: Telephone follow up appointment with care management team member scheduled for:  Thursday, August 07, 2021 at 11:15 am The patient has been provided with contact information for the care management team and has been advised to call with any health related questions or concerns  [name], RN, BSN, [place] [phone]: direct office

## 2021-04-30 ENCOUNTER — Encounter: Payer: Self-pay | Admitting: Internal Medicine

## 2021-04-30 ENCOUNTER — Telehealth: Payer: Self-pay | Admitting: Internal Medicine

## 2021-04-30 DIAGNOSIS — M1990 Unspecified osteoarthritis, unspecified site: Secondary | ICD-10-CM | POA: Diagnosis not present

## 2021-04-30 DIAGNOSIS — F32A Depression, unspecified: Secondary | ICD-10-CM | POA: Diagnosis not present

## 2021-04-30 DIAGNOSIS — J45909 Unspecified asthma, uncomplicated: Secondary | ICD-10-CM | POA: Diagnosis not present

## 2021-04-30 NOTE — Telephone Encounter (Signed)
Appointment made

## 2021-04-30 NOTE — Progress Notes (Signed)
? ? ?Subjective:  ? ? Patient ID: Adrian Rios, male    DOB: 1940/05/22, 81 y.o.   MRN: [id number] ? ?This visit occurred during the SARS-CoV-2 public health emergency.  Safety protocols were in place, including screening questions prior to the visit, additional usage of staff PPE, and extensive cleaning of exam room while observing appropriate contact time as indicated for disinfecting solutions. ? ? ? ?HPI ?Adrian Rios is here for  ?Chief Complaint  ?Patient presents with  ? Shoulder Pain  ?  Left shoulder pain  ? ? ? ?Left shoulder pain -  he had reverse total shoulder arthroplasty at [facility] about one year ago.  The pain started about 10 days after trying to fix his shower head.  When he was doing this he had both of his arms up and he was advised he should not do that too much.  The pain was bad initially. The pain is better - it is intermittent.  Now he hears an intermittent grinding or clicking sound.  He denies swelling or bruising. No radiating pain or N/T.  He is taking Tylenol and that is helping.  ? ? ? ?Medications and allergies reviewed with patient and updated if appropriate. ? ?Current Outpatient Medications on File Prior to Visit  ?Medication Sig Dispense Refill  ? acetaminophen (TYLENOL) 500 MG tablet Take 1,000 mg by mouth every 6 (six) hours as needed for headache or fever (pain).    ? ADVAIR DISKUS 250-50 MCG/ACT AEPB Inhale 1 puff into the lungs 2 (two) times daily.    ? albuterol (VENTOLIN HFA) 108 (90 Base) MCG/ACT inhaler Inhale 2 puffs into the lungs every 6 (six) hours as needed for wheezing or shortness of breath. 1 each 8  ? Fluticasone-Salmeterol 232-14 MCG/ACT AEPB Inhale 1 puff into the lungs in the morning and at bedtime. (Patient taking differently: Inhale 1 puff into the lungs at bedtime.) 1 each 11  ? latanoprost (XALATAN) 0.005 % ophthalmic solution Place 1 drop into the right eye at bedtime.    ? magnesium oxide (MAG-OX) 400 MG tablet Take 400 mg by mouth daily with supper.    ?  methenamine (HIPREX) 1 g tablet Take 1 g by mouth 2 (two) times daily with a meal.    ? Misc Natural Products (GLUCOSAMINE CHOND DOUBLE STR PO) Take 1 tablet by mouth 2 (two) times daily.    ? montelukast (SINGULAIR) 10 MG tablet Take 1 tablet (10 mg total) by mouth at bedtime. 90 tablet 2  ? triamcinolone (NASACORT) 55 MCG/ACT AERO nasal inhaler Place 1 spray into the nose at bedtime as needed (congestion/nasal drainage).    ? venlafaxine (EFFEXOR) 75 MG tablet Take 1.5 tablets (112.5 mg total) by mouth daily. (Patient taking differently: Take 112.5 mg by mouth every morning.) 135 tablet 1  ? ?No current facility-administered medications on file prior to visit.  ? ? ?Review of Systems ?Per HPI ?   ?Objective:  ? ?Vitals:  ? 05/01/21 0754  ?BP: 104/78  ?Pulse: 80  ?Temp: 98.3 ?F (36.8 ?C)  ?SpO2: 98%  ? ?BP Readings from Last 3 Encounters:  ?05/01/21 104/78  ?03/26/21 102/60  ?03/19/21 122/72  ? ?Wt Readings from Last 3 Encounters:  ?05/01/21 170 lb (77.1 kg)  ?03/26/21 167 lb (75.8 kg)  ?03/17/21 172 lb 9.9 oz (78.3 kg)  ? ?Body mass index is 26.63 kg/m?. ? ?  ?Physical Exam ?   ?A left Shoulder exam was performed.  ? ?SWELLING: none  ?EFFUSION:  no  ?WARMTH: no warmth  ?TENDERNESS: Some tenderness with palpation of left upper arm, anterior shoulder and posterior shoulder.  Discomfort with moving around anterior-posterior ?ROM: full ROM with pain ?NEUROLOGICAL EXAM: normal sensation and strength  ?PULSES: normal  ? ? ?Last shoulder xray -  ? ?Study: XR SHOULDER COMPLETE LEFT MINIMUM 2 VIEWS  ? ?Views: 2  ? 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  ? ?Comparisons: May 09, 2020  ? ?Findings/Impression:  ? ?Hardware: Status post left reverse total shoulder arthroplasty. The  ?hardware is intact and the alignment is unchanged. No perihardware lucency  ?to suggest loosening.  ? ?Alignment: No dislocation.  ? ?Bones: Diffuse bony demineralization. No displaced fracture.  ? ?Joint spaces: Mild  degenerative changes of the acromioclavicular joint.  ? ?Soft tissue: No acute soft tissue abnormality.  ? ?Electronically Signed by:  Madie Reno, DO, [facility]  ?Electronically Signed on:  05/31/2020 12:00 PM ? ? ?Assessment & Plan:  ? ? ?See Problem List for Assessment and Plan of chronic medical problems.  ? ? ? ? ? ?

## 2021-04-30 NOTE — Telephone Encounter (Signed)
Pt states he had surgery on his surgery and now is experiencing pain at the site ? ?Pt requesting an order for an xray, offered pt an appt, pt declined ? ?Pt requesting a c/b ?

## 2021-05-01 ENCOUNTER — Ambulatory Visit (INDEPENDENT_AMBULATORY_CARE_PROVIDER_SITE_OTHER): Payer: Medicare Other

## 2021-05-01 ENCOUNTER — Ambulatory Visit (INDEPENDENT_AMBULATORY_CARE_PROVIDER_SITE_OTHER): Payer: Medicare Other | Admitting: Internal Medicine

## 2021-05-01 VITALS — BP 104/78 | HR 80 | Temp 98.3°F | Ht 67.0 in | Wt 170.0 lb

## 2021-05-01 DIAGNOSIS — M25512 Pain in left shoulder: Secondary | ICD-10-CM | POA: Diagnosis not present

## 2021-05-01 NOTE — Assessment & Plan Note (Signed)
Acute ?Started about 10 days ago after having his arms above his head for an extended period of time fixing his showerhead ?S/p reverse total shoulder replacement about 1 year ago at [facility] ?Pain was initially more severe and now has improved and is intermittent and tolerable ?No radiation of pain, no numbness or tingling, no swelling ?Pain controlled with Tylenol ?X-ray of shoulder today ?We will follow-up with [facility] depending on x-ray results and if pain is not improving ?

## 2021-05-01 NOTE — Patient Instructions (Addendum)
? ? ?  Have an xray downstairs.   ? ? ? ?Medications changes include :   none ? ? ? ?

## 2021-05-02 ENCOUNTER — Telehealth: Payer: Self-pay | Admitting: Internal Medicine

## 2021-05-02 NOTE — Telephone Encounter (Signed)
It is very unusual that it has not been resulted yet by the radiologist.  I will send him a note over the weekend through [facility]. ?

## 2021-05-02 NOTE — Telephone Encounter (Signed)
Pt checking status of 05-01-2021 xray result ? ?Provider has not resulted results as of yet, pt requesting a c/b ?

## 2021-05-05 NOTE — Telephone Encounter (Signed)
Copy faxed to Dr. Helaine Chess today and patient notified. ?

## 2021-05-06 DIAGNOSIS — N200 Calculus of kidney: Secondary | ICD-10-CM | POA: Diagnosis not present

## 2021-05-23 ENCOUNTER — Emergency Department (HOSPITAL_BASED_OUTPATIENT_CLINIC_OR_DEPARTMENT_OTHER)
Admission: EM | Admit: 2021-05-23 | Discharge: 2021-05-23 | Disposition: A | Discharge status: Home / Self Care | Payer: Medicare Other | Attending: Emergency Medicine | Admitting: Emergency Medicine

## 2021-05-23 ENCOUNTER — Encounter (HOSPITAL_BASED_OUTPATIENT_CLINIC_OR_DEPARTMENT_OTHER): Payer: Self-pay

## 2021-05-23 ENCOUNTER — Other Ambulatory Visit: Payer: Self-pay

## 2021-05-23 DIAGNOSIS — Z5321 Procedure and treatment not carried out due to patient leaving prior to being seen by health care provider: Secondary | ICD-10-CM | POA: Diagnosis not present

## 2021-05-23 DIAGNOSIS — Z23 Encounter for immunization: Secondary | ICD-10-CM | POA: Diagnosis not present

## 2021-05-23 DIAGNOSIS — S6990XA Unspecified injury of unspecified wrist, hand and finger(s), initial encounter: Secondary | ICD-10-CM | POA: Diagnosis not present

## 2021-05-23 DIAGNOSIS — S60351A Superficial foreign body of right thumb, initial encounter: Secondary | ICD-10-CM | POA: Insufficient documentation

## 2021-05-23 DIAGNOSIS — W458XXA Other foreign body or object entering through skin, initial encounter: Secondary | ICD-10-CM | POA: Diagnosis not present

## 2021-05-23 NOTE — ED Triage Notes (Signed)
Pt with fish hook right thumb ~1hour-states he attempted to remove PTA-NAD-steady gait ?

## 2021-05-28 DIAGNOSIS — R918 Other nonspecific abnormal finding of lung field: Secondary | ICD-10-CM | POA: Diagnosis not present

## 2021-05-28 DIAGNOSIS — Z79899 Other long term (current) drug therapy: Secondary | ICD-10-CM | POA: Diagnosis not present

## 2021-05-28 DIAGNOSIS — C678 Malignant neoplasm of overlapping sites of bladder: Secondary | ICD-10-CM | POA: Diagnosis not present

## 2021-05-28 DIAGNOSIS — Z8551 Personal history of malignant neoplasm of bladder: Secondary | ICD-10-CM | POA: Diagnosis not present

## 2021-05-28 DIAGNOSIS — N12 Tubulo-interstitial nephritis, not specified as acute or chronic: Secondary | ICD-10-CM | POA: Diagnosis not present

## 2021-05-28 DIAGNOSIS — C61 Malignant neoplasm of prostate: Secondary | ICD-10-CM | POA: Diagnosis not present

## 2021-05-28 DIAGNOSIS — Z96612 Presence of left artificial shoulder joint: Secondary | ICD-10-CM | POA: Diagnosis not present

## 2021-06-04 ENCOUNTER — Telehealth: Payer: Self-pay | Admitting: Internal Medicine

## 2021-06-04 DIAGNOSIS — Z85828 Personal history of other malignant neoplasm of skin: Secondary | ICD-10-CM

## 2021-06-04 NOTE — Telephone Encounter (Signed)
Referral ordered-not sure if he needs a call back ?

## 2021-06-04 NOTE — Telephone Encounter (Signed)
PT calls today in regards to a referral to a dermatologist. PT's current dermatologist has retired and he is seeking care at [facility] under the care of Dr. Leary Roca. [place] .  ? ?Phone for [facility] ?: [phone] ?Fax: [phone] ? ?CB for patient: [phone] ?

## 2021-06-13 ENCOUNTER — Encounter (HOSPITAL_BASED_OUTPATIENT_CLINIC_OR_DEPARTMENT_OTHER): Payer: Self-pay | Admitting: *Deleted

## 2021-06-13 ENCOUNTER — Emergency Department (HOSPITAL_BASED_OUTPATIENT_CLINIC_OR_DEPARTMENT_OTHER): Payer: Medicare Other

## 2021-06-13 ENCOUNTER — Other Ambulatory Visit: Payer: Self-pay

## 2021-06-13 ENCOUNTER — Inpatient Hospital Stay (HOSPITAL_BASED_OUTPATIENT_CLINIC_OR_DEPARTMENT_OTHER)
Admission: EM | Admit: 2021-06-13 | Discharge: 2021-06-16 | DRG: 872 | Disposition: A | Discharge status: Home / Self Care | Payer: Medicare Other | Attending: Student | Admitting: Student

## 2021-06-13 DIAGNOSIS — Z823 Family history of stroke: Secondary | ICD-10-CM

## 2021-06-13 DIAGNOSIS — Z8551 Personal history of malignant neoplasm of bladder: Secondary | ICD-10-CM

## 2021-06-13 DIAGNOSIS — Z85820 Personal history of malignant melanoma of skin: Secondary | ICD-10-CM

## 2021-06-13 DIAGNOSIS — K219 Gastro-esophageal reflux disease without esophagitis: Secondary | ICD-10-CM | POA: Diagnosis present

## 2021-06-13 DIAGNOSIS — I700 Atherosclerosis of aorta: Secondary | ICD-10-CM | POA: Diagnosis present

## 2021-06-13 DIAGNOSIS — Z91013 Allergy to seafood: Secondary | ICD-10-CM | POA: Diagnosis not present

## 2021-06-13 DIAGNOSIS — N132 Hydronephrosis with renal and ureteral calculous obstruction: Secondary | ICD-10-CM | POA: Diagnosis not present

## 2021-06-13 DIAGNOSIS — Z906 Acquired absence of other parts of urinary tract: Secondary | ICD-10-CM | POA: Diagnosis not present

## 2021-06-13 DIAGNOSIS — Z91048 Other nonmedicinal substance allergy status: Secondary | ICD-10-CM

## 2021-06-13 DIAGNOSIS — N400 Enlarged prostate without lower urinary tract symptoms: Secondary | ICD-10-CM | POA: Diagnosis present

## 2021-06-13 DIAGNOSIS — J9811 Atelectasis: Secondary | ICD-10-CM | POA: Diagnosis not present

## 2021-06-13 DIAGNOSIS — Z8546 Personal history of malignant neoplasm of prostate: Secondary | ICD-10-CM

## 2021-06-13 DIAGNOSIS — A419 Sepsis, unspecified organism: Secondary | ICD-10-CM | POA: Diagnosis not present

## 2021-06-13 DIAGNOSIS — Z888 Allergy status to other drugs, medicaments and biological substances status: Secondary | ICD-10-CM

## 2021-06-13 DIAGNOSIS — E785 Hyperlipidemia, unspecified: Secondary | ICD-10-CM | POA: Diagnosis present

## 2021-06-13 DIAGNOSIS — M47816 Spondylosis without myelopathy or radiculopathy, lumbar region: Secondary | ICD-10-CM | POA: Diagnosis not present

## 2021-06-13 DIAGNOSIS — Z8616 Personal history of COVID-19: Secondary | ICD-10-CM | POA: Diagnosis not present

## 2021-06-13 DIAGNOSIS — Z79899 Other long term (current) drug therapy: Secondary | ICD-10-CM | POA: Diagnosis not present

## 2021-06-13 DIAGNOSIS — F32A Depression, unspecified: Secondary | ICD-10-CM | POA: Diagnosis present

## 2021-06-13 DIAGNOSIS — D869 Sarcoidosis, unspecified: Secondary | ICD-10-CM | POA: Diagnosis present

## 2021-06-13 DIAGNOSIS — Z87440 Personal history of urinary (tract) infections: Secondary | ICD-10-CM | POA: Diagnosis not present

## 2021-06-13 DIAGNOSIS — Z91041 Radiographic dye allergy status: Secondary | ICD-10-CM

## 2021-06-13 DIAGNOSIS — Z20822 Contact with and (suspected) exposure to covid-19: Secondary | ICD-10-CM | POA: Diagnosis not present

## 2021-06-13 DIAGNOSIS — N390 Urinary tract infection, site not specified: Secondary | ICD-10-CM | POA: Diagnosis not present

## 2021-06-13 DIAGNOSIS — J45909 Unspecified asthma, uncomplicated: Secondary | ICD-10-CM | POA: Diagnosis not present

## 2021-06-13 DIAGNOSIS — K5730 Diverticulosis of large intestine without perforation or abscess without bleeding: Secondary | ICD-10-CM | POA: Diagnosis not present

## 2021-06-13 DIAGNOSIS — N136 Pyonephrosis: Secondary | ICD-10-CM | POA: Diagnosis not present

## 2021-06-13 DIAGNOSIS — R7303 Prediabetes: Secondary | ICD-10-CM | POA: Diagnosis present

## 2021-06-13 DIAGNOSIS — Z8249 Family history of ischemic heart disease and other diseases of the circulatory system: Secondary | ICD-10-CM

## 2021-06-13 DIAGNOSIS — Z803 Family history of malignant neoplasm of breast: Secondary | ICD-10-CM

## 2021-06-13 DIAGNOSIS — F3289 Other specified depressive episodes: Secondary | ICD-10-CM | POA: Diagnosis not present

## 2021-06-13 DIAGNOSIS — N3000 Acute cystitis without hematuria: Secondary | ICD-10-CM | POA: Diagnosis not present

## 2021-06-13 DIAGNOSIS — J4520 Mild intermittent asthma, uncomplicated: Secondary | ICD-10-CM | POA: Diagnosis not present

## 2021-06-13 NOTE — ED Triage Notes (Signed)
Pt. Reports he has had a fever today with c/o poss. UTI that started  2 days ago.  Pt. Has felt really bad x 2 days. Reports today he got worse with fever today and took 1 gram of tylenol tonight at 10:00.  Pt. Has Hx. Of bladder CA with Urostomy bag.  No c/o nausea or vomiting per Pt. ?

## 2021-06-13 NOTE — ED Notes (Signed)
Patient transported to X-ray 

## 2021-06-14 ENCOUNTER — Emergency Department (HOSPITAL_BASED_OUTPATIENT_CLINIC_OR_DEPARTMENT_OTHER): Payer: Medicare Other

## 2021-06-14 DIAGNOSIS — Z8249 Family history of ischemic heart disease and other diseases of the circulatory system: Secondary | ICD-10-CM | POA: Diagnosis not present

## 2021-06-14 DIAGNOSIS — Z91048 Other nonmedicinal substance allergy status: Secondary | ICD-10-CM | POA: Diagnosis not present

## 2021-06-14 DIAGNOSIS — K5730 Diverticulosis of large intestine without perforation or abscess without bleeding: Secondary | ICD-10-CM | POA: Diagnosis not present

## 2021-06-14 DIAGNOSIS — D869 Sarcoidosis, unspecified: Secondary | ICD-10-CM | POA: Diagnosis present

## 2021-06-14 DIAGNOSIS — Z823 Family history of stroke: Secondary | ICD-10-CM | POA: Diagnosis not present

## 2021-06-14 DIAGNOSIS — Z8616 Personal history of COVID-19: Secondary | ICD-10-CM | POA: Diagnosis not present

## 2021-06-14 DIAGNOSIS — Z87440 Personal history of urinary (tract) infections: Secondary | ICD-10-CM | POA: Diagnosis not present

## 2021-06-14 DIAGNOSIS — Z85820 Personal history of malignant melanoma of skin: Secondary | ICD-10-CM | POA: Diagnosis not present

## 2021-06-14 DIAGNOSIS — F3289 Other specified depressive episodes: Secondary | ICD-10-CM | POA: Diagnosis not present

## 2021-06-14 DIAGNOSIS — K219 Gastro-esophageal reflux disease without esophagitis: Secondary | ICD-10-CM | POA: Diagnosis present

## 2021-06-14 DIAGNOSIS — N136 Pyonephrosis: Secondary | ICD-10-CM | POA: Diagnosis present

## 2021-06-14 DIAGNOSIS — Z8546 Personal history of malignant neoplasm of prostate: Secondary | ICD-10-CM | POA: Diagnosis not present

## 2021-06-14 DIAGNOSIS — Z888 Allergy status to other drugs, medicaments and biological substances status: Secondary | ICD-10-CM | POA: Diagnosis not present

## 2021-06-14 DIAGNOSIS — Z91013 Allergy to seafood: Secondary | ICD-10-CM | POA: Diagnosis not present

## 2021-06-14 DIAGNOSIS — N3000 Acute cystitis without hematuria: Secondary | ICD-10-CM

## 2021-06-14 DIAGNOSIS — Z803 Family history of malignant neoplasm of breast: Secondary | ICD-10-CM | POA: Diagnosis not present

## 2021-06-14 DIAGNOSIS — J4520 Mild intermittent asthma, uncomplicated: Secondary | ICD-10-CM | POA: Diagnosis not present

## 2021-06-14 DIAGNOSIS — Z20822 Contact with and (suspected) exposure to covid-19: Secondary | ICD-10-CM | POA: Diagnosis present

## 2021-06-14 DIAGNOSIS — Z91041 Radiographic dye allergy status: Secondary | ICD-10-CM | POA: Diagnosis not present

## 2021-06-14 DIAGNOSIS — N132 Hydronephrosis with renal and ureteral calculous obstruction: Secondary | ICD-10-CM | POA: Diagnosis not present

## 2021-06-14 DIAGNOSIS — J9811 Atelectasis: Secondary | ICD-10-CM | POA: Diagnosis not present

## 2021-06-14 DIAGNOSIS — N400 Enlarged prostate without lower urinary tract symptoms: Secondary | ICD-10-CM | POA: Diagnosis present

## 2021-06-14 DIAGNOSIS — F32A Depression, unspecified: Secondary | ICD-10-CM | POA: Diagnosis present

## 2021-06-14 DIAGNOSIS — Z906 Acquired absence of other parts of urinary tract: Secondary | ICD-10-CM | POA: Diagnosis not present

## 2021-06-14 DIAGNOSIS — Z8551 Personal history of malignant neoplasm of bladder: Secondary | ICD-10-CM | POA: Diagnosis not present

## 2021-06-14 DIAGNOSIS — Z79899 Other long term (current) drug therapy: Secondary | ICD-10-CM | POA: Diagnosis not present

## 2021-06-14 DIAGNOSIS — M47816 Spondylosis without myelopathy or radiculopathy, lumbar region: Secondary | ICD-10-CM | POA: Diagnosis not present

## 2021-06-14 DIAGNOSIS — A419 Sepsis, unspecified organism: Secondary | ICD-10-CM | POA: Diagnosis present

## 2021-06-14 DIAGNOSIS — N390 Urinary tract infection, site not specified: Secondary | ICD-10-CM | POA: Diagnosis present

## 2021-06-14 DIAGNOSIS — E785 Hyperlipidemia, unspecified: Secondary | ICD-10-CM | POA: Diagnosis present

## 2021-06-14 DIAGNOSIS — J45909 Unspecified asthma, uncomplicated: Secondary | ICD-10-CM | POA: Diagnosis present

## 2021-06-14 DIAGNOSIS — I700 Atherosclerosis of aorta: Secondary | ICD-10-CM | POA: Diagnosis present

## 2021-06-14 LAB — PROTIME-INR
INR: 1.1 (ref 0.8–1.2)
Prothrombin Time: 13.9 seconds (ref 11.4–15.2)

## 2021-06-14 LAB — URINALYSIS, MICROSCOPIC (REFLEX)

## 2021-06-14 LAB — CBC WITH DIFFERENTIAL/PLATELET
Abs Immature Granulocytes: 0.04 10*3/uL (ref 0.00–0.07)
Basophils Absolute: 0.1 10*3/uL (ref 0.0–0.1)
Basophils Relative: 1 %
Eosinophils Absolute: 0.2 10*3/uL (ref 0.0–0.5)
Eosinophils Relative: 1 %
HCT: 37.7 % — ABNORMAL LOW (ref 39.0–52.0)
Hemoglobin: 12.9 g/dL — ABNORMAL LOW (ref 13.0–17.0)
Immature Granulocytes: 0 %
Lymphocytes Relative: 16 %
Lymphs Abs: 2 10*3/uL (ref 0.7–4.0)
MCH: 31.1 pg (ref 26.0–34.0)
MCHC: 34.2 g/dL (ref 30.0–36.0)
MCV: 90.8 fL (ref 80.0–100.0)
Monocytes Absolute: 1 10*3/uL (ref 0.1–1.0)
Monocytes Relative: 8 %
Neutro Abs: 9.1 10*3/uL — ABNORMAL HIGH (ref 1.7–7.7)
Neutrophils Relative %: 74 %
Platelets: 349 10*3/uL (ref 150–400)
RBC: 4.15 MIL/uL — ABNORMAL LOW (ref 4.22–5.81)
RDW: 13.4 % (ref 11.5–15.5)
WBC: 12.3 10*3/uL — ABNORMAL HIGH (ref 4.0–10.5)
nRBC: 0 % (ref 0.0–0.2)

## 2021-06-14 LAB — URINALYSIS, ROUTINE W REFLEX MICROSCOPIC
Bilirubin Urine: NEGATIVE
Glucose, UA: NEGATIVE mg/dL
Ketones, ur: NEGATIVE mg/dL
Nitrite: POSITIVE — AB
Protein, ur: NEGATIVE mg/dL
Specific Gravity, Urine: 1.02 (ref 1.005–1.030)
pH: 6 (ref 5.0–8.0)

## 2021-06-14 LAB — COMPREHENSIVE METABOLIC PANEL
ALT: 15 U/L (ref 0–44)
AST: 16 U/L (ref 15–41)
Albumin: 3.4 g/dL — ABNORMAL LOW (ref 3.5–5.0)
Alkaline Phosphatase: 93 U/L (ref 38–126)
Anion gap: 8 (ref 5–15)
BUN: 22 mg/dL (ref 8–23)
CO2: 21 mmol/L — ABNORMAL LOW (ref 22–32)
Calcium: 8.8 mg/dL — ABNORMAL LOW (ref 8.9–10.3)
Chloride: 109 mmol/L (ref 98–111)
Creatinine, Ser: 1.13 mg/dL (ref 0.61–1.24)
GFR, Estimated: >60 mL/min (ref >60)
Glucose, Bld: 119 mg/dL — ABNORMAL HIGH (ref 70–99)
Potassium: 3.7 mmol/L (ref 3.5–5.1)
Sodium: 138 mmol/L (ref 135–145)
Total Bilirubin: 0.6 mg/dL (ref 0.3–1.2)
Total Protein: 7.6 g/dL (ref 6.5–8.1)

## 2021-06-14 LAB — RESP PANEL BY RT-PCR (FLU A&B, COVID) ARPGX2
Influenza A by PCR: NEGATIVE
Influenza B by PCR: NEGATIVE
SARS Coronavirus 2 by RT PCR: NEGATIVE

## 2021-06-14 LAB — LACTIC ACID, PLASMA
Lactic Acid, Venous: 0.8 mmol/L (ref 0.5–1.9)
Lactic Acid, Venous: 0.8 mmol/L (ref 0.5–1.9)

## 2021-06-14 LAB — APTT: aPTT: 34 seconds (ref 24–36)

## 2021-06-14 MED ORDER — SODIUM CHLORIDE 0.9 % IV SOLN
1.0000 g | Freq: Three times a day (TID) | INTRAVENOUS | Status: DC
  Administered 2021-06-14 – 2021-06-16 (×7): 1 g via INTRAVENOUS
  Filled 2021-06-14: qty 1
  Filled 2021-06-14 (×4): qty 20
  Filled 2021-06-14 (×2): qty 1
  Filled 2021-06-14: qty 20

## 2021-06-14 MED ORDER — VENLAFAXINE HCL 75 MG PO TABS
112.5000 mg | ORAL_TABLET | Freq: Every day | ORAL | Status: DC
  Administered 2021-06-14 – 2021-06-16 (×3): 112.5 mg via ORAL
  Filled 2021-06-14 (×3): qty 1.5

## 2021-06-14 MED ORDER — ACETAMINOPHEN 650 MG RE SUPP: 650.0000 mg | Freq: Four times a day (QID) | RECTAL | Status: DC | PRN

## 2021-06-14 MED ORDER — ONDANSETRON HCL 4 MG PO TABS: 4.0000 mg | ORAL_TABLET | Freq: Four times a day (QID) | ORAL | Status: DC | PRN

## 2021-06-14 MED ORDER — LACTATED RINGERS IV SOLN: INTRAVENOUS | Status: AC

## 2021-06-14 MED ORDER — VENLAFAXINE HCL 75 MG PO TABS
112.5000 mg | ORAL_TABLET | Freq: Every morning | ORAL | Status: DC
  Filled 2021-06-14: qty 1

## 2021-06-14 MED ORDER — LATANOPROST 0.005 % OP SOLN
1.0000 [drp] | Freq: Every day | OPHTHALMIC | Status: DC
  Administered 2021-06-14 – 2021-06-15 (×2): 1 [drp] via OPHTHALMIC
  Filled 2021-06-14: qty 2.5

## 2021-06-14 MED ORDER — ALBUTEROL SULFATE (2.5 MG/3ML) 0.083% IN NEBU: 2.5000 mg | INHALATION_SOLUTION | Freq: Four times a day (QID) | RESPIRATORY_TRACT | Status: DC | PRN

## 2021-06-14 MED ORDER — ALBUTEROL SULFATE HFA 108 (90 BASE) MCG/ACT IN AERS: 2.0000 | INHALATION_SPRAY | Freq: Four times a day (QID) | RESPIRATORY_TRACT | Status: DC | PRN

## 2021-06-14 MED ORDER — METHENAMINE HIPPURATE 1 G PO TABS: 1.0000 g | ORAL_TABLET | Freq: Two times a day (BID) | ORAL | Status: DC

## 2021-06-14 MED ORDER — MOMETASONE FURO-FORMOTEROL FUM 200-5 MCG/ACT IN AERO
2.0000 | INHALATION_SPRAY | Freq: Two times a day (BID) | RESPIRATORY_TRACT | Status: DC
Start: 2021-06-14 — End: 2021-06-16
  Administered 2021-06-14 – 2021-06-16 (×4): 2 via RESPIRATORY_TRACT
  Filled 2021-06-14: qty 8.8

## 2021-06-14 MED ORDER — TRIAMCINOLONE ACETONIDE 55 MCG/ACT NA AERO
1.0000 | INHALATION_SPRAY | Freq: Every day | NASAL | Status: DC
  Administered 2021-06-14 – 2021-06-15 (×2): 1 via NASAL
  Filled 2021-06-14: qty 10.8

## 2021-06-14 MED ORDER — ENOXAPARIN SODIUM 40 MG/0.4ML IJ SOSY
40.0000 mg | PREFILLED_SYRINGE | INTRAMUSCULAR | Status: DC
  Administered 2021-06-14 – 2021-06-16 (×3): 40 mg via SUBCUTANEOUS
  Filled 2021-06-14 (×3): qty 0.4

## 2021-06-14 MED ORDER — MONTELUKAST SODIUM 10 MG PO TABS
10.0000 mg | ORAL_TABLET | Freq: Every day | ORAL | Status: DC
  Administered 2021-06-14 – 2021-06-15 (×2): 10 mg via ORAL
  Filled 2021-06-14 (×2): qty 1

## 2021-06-14 MED ORDER — SODIUM CHLORIDE 0.9 % IV SOLN
1.0000 g | Freq: Once | INTRAVENOUS | Status: AC
  Administered 2021-06-14: 1 g via INTRAVENOUS

## 2021-06-14 MED ORDER — ONDANSETRON HCL 4 MG/2ML IJ SOLN: 4.0000 mg | Freq: Four times a day (QID) | INTRAMUSCULAR | Status: DC | PRN

## 2021-06-14 MED ORDER — LACTATED RINGERS IV BOLUS (SEPSIS)
1000.0000 mL | Freq: Once | INTRAVENOUS | Status: AC
  Administered 2021-06-14: 1000 mL via INTRAVENOUS

## 2021-06-14 MED ORDER — ACETAMINOPHEN 325 MG PO TABS
650.0000 mg | ORAL_TABLET | Freq: Four times a day (QID) | ORAL | Status: DC | PRN
  Administered 2021-06-15 – 2021-06-16 (×4): 650 mg via ORAL
  Filled 2021-06-14 (×4): qty 2

## 2021-06-14 MED ORDER — MAGNESIUM OXIDE -MG SUPPLEMENT 400 (240 MG) MG PO TABS
400.0000 mg | ORAL_TABLET | Freq: Every day | ORAL | Status: DC
  Administered 2021-06-14 – 2021-06-15 (×2): 400 mg via ORAL
  Filled 2021-06-14 (×2): qty 1

## 2021-06-14 MED ORDER — ACETAMINOPHEN 325 MG PO TABS
650.0000 mg | ORAL_TABLET | Freq: Once | ORAL | Status: AC
  Administered 2021-06-14: 650 mg via ORAL
  Filled 2021-06-14: qty 2

## 2021-06-14 NOTE — Plan of Care (Signed)
?  Problem: Clinical Measurements: ?Goal: Diagnostic test results will improve ?Outcome: Progressing ?  ?Problem: Clinical Measurements: ?Goal: Respiratory complications will improve ?Outcome: Progressing ?  ?Problem: Activity: ?Goal: Risk for activity intolerance will decrease ?Outcome: Progressing ?  ?Problem: Nutrition: ?Goal: Adequate nutrition will be maintained ?Outcome: Progressing ?  ?Problem: Pain Managment: ?Goal: General experience of comfort will improve ?Outcome: Progressing ?  ?Problem: Safety: ?Goal: Ability to remain free from injury will improve ?Outcome: Progressing ?  ?

## 2021-06-14 NOTE — ED Provider Notes (Signed)
?[facility] EM[facility] DEPARTMENT ?Provider Note ? ? ?CSN: [id number] ?Arrival date & time: 06/13/21  2313 ? ?  ? ?History ? ?Chief Complaint  ?Patient presents with  ? Urinary Retention  ? Fever  ? ? ?Adrian Rios is a 81 y.o. male. ? ?Adrian Rios is an 81 year old male with past medical history significant for bladder cancer s/p cystectomy with ileal conduit April 2018, history of recurrent UTIs/pyelonephritis, sarcoidosis, chronic bilateral hydronephrosis, recent history of ESBL UTI.  Presenting with concern for UTI.  States for the past 2 days has had right-sided flank pain with subjective fever and did have temperature up to 101.  Has had constant right flank pain with nausea but no vomiting.  Still normal amounts of urine in his urostomy.  Urine has been cloudy and sediment appearing.  No chest pain or shortness of breath.  No significant abdominal pain most of the pain is to his right flank.  Feels similar to previous UTIs.  Also has kidney stones in the past. ? ?The history is provided by the patient and the spouse.  ?Fever ?Associated symptoms: dysuria   ?Associated symptoms: no chest pain, no congestion, no cough, no headaches, no nausea, no rash, no rhinorrhea and no vomiting   ? ?  ? ?Home Medications ?Prior to Admission medications   ?Medication Sig Start Date End Date Taking? Authorizing Provider  ?acetaminophen (TYLENOL) 500 MG tablet Take 1,000 mg by mouth every 6 (six) hours as needed for headache or fever (pain).    [provider]  ?ADVAIR DISKUS 250-50 MCG/ACT AEPB Inhale 1 puff into the lungs 2 (two) times daily. 02/10/21   [provider]  ?albuterol (VENTOLIN HFA) 108 (90 Base) MCG/ACT inhaler Inhale 2 puffs into the lungs every 6 (six) hours as needed for wheezing or shortness of breath. 02/01/20   [name], MD  ?Fluticasone-Salmeterol 232-14 MCG/ACT AEPB Inhale 1 puff into the lungs in the morning and at bedtime. ?Patient taking differently: Inhale 1 puff  into the lungs at bedtime. 12/22/20   [name], MD  ?latanoprost (XALATAN) 0.005 % ophthalmic solution Place 1 drop into the right eye at bedtime. 05/06/20   [provider]  ?magnesium oxide (MAG-OX) 400 MG tablet Take 400 mg by mouth daily with supper.    [provider]  ?methenamine (HIPREX) 1 g tablet Take 1 g by mouth 2 (two) times daily with a meal.    Burns, Claudina Lick, MD  ?Misc Natural Products (GLUCOSAMINE CHOND DOUBLE STR PO) Take 1 tablet by mouth 2 (two) times daily.    [provider]  ?montelukast (SINGULAIR) 10 MG tablet Take 1 tablet (10 mg total) by mouth at bedtime. 01/27/21   [name], MD  ?triamcinolone (NASACORT) 55 MCG/ACT AERO nasal inhaler Place 1 spray into the nose at bedtime as needed (congestion/nasal drainage).    [provider]  ?venlafaxine (EFFEXOR) 75 MG tablet Take 1.5 tablets (112.5 mg total) by mouth daily. ?Patient taking differently: Take 112.5 mg by mouth every morning. 01/08/21   [name], MD  ?   ? ?Allergies    ?Ivp dye [iodinated contrast media], Metrizamide, Shellfish allergy, Gabapentin, Niacin-lovastatin er, Gadolinium, and Adhesive [tape]   ? ?Review of Systems   ?Review of Systems  ?Constitutional:  Positive for activity change, appetite change and fever.  ?HENT:  Negative for congestion and rhinorrhea.   ?Respiratory:  Negative for cough, chest tightness and shortness of breath.   ?  Cardiovascular:  Negative for chest pain.  ?Gastrointestinal:  Positive for abdominal pain. Negative for nausea and vomiting.  ?Genitourinary:  Positive for dysuria and urgency. Negative for hematuria.  ?Musculoskeletal:  Positive for back pain.  ?Skin:  Negative for rash.  ?Neurological:  Negative for dizziness, weakness and headaches.  ? all other systems are negative except as noted in the HPI and PMH.  ? ?Physical Exam ?Updated Vital Signs ?BP 120/73   Pulse 87   Temp 99.3 ?F (37.4 ?C) (Oral)   Resp 18   SpO2 94%  ?Physical  Exam ?Vitals and nursing note reviewed.  ?Constitutional:   ?   General: He is not in acute distress. ?   Appearance: He is well-developed. He is not ill-appearing.  ?HENT:  ?   Head: Normocephalic and atraumatic.  ?   Mouth/Throat:  ?   Pharynx: No oropharyngeal exudate.  ?Eyes:  ?   Conjunctiva/sclera: Conjunctivae normal.  ?   Pupils: Pupils are equal, round, and reactive to light.  ?Neck:  ?   Comments: No meningismus. ?Cardiovascular:  ?   Rate and Rhythm: Normal rate and regular rhythm.  ?   Heart sounds: Normal heart sounds. No murmur heard. ?Pulmonary:  ?   Effort: Pulmonary effort is normal. No respiratory distress.  ?   Breath sounds: Normal breath sounds.  ?Chest:  ?   Chest wall: No tenderness.  ?Abdominal:  ?   Palpations: Abdomen is soft.  ?   Tenderness: There is no abdominal tenderness. There is no guarding or rebound.  ?   Comments: Right-sided urostomy  ?Musculoskeletal:     ?   General: Tenderness present. Normal range of motion.  ?   Cervical back: Normal range of motion and neck supple.  ?   Comments: R CVAT  ?Skin: ?   General: Skin is warm.  ?Neurological:  ?   Mental Status: He is alert and oriented to person, place, and time.  ?   Cranial Nerves: No cranial nerve deficit.  ?   Motor: No abnormal muscle tone.  ?   Coordination: Coordination normal.  ?   Comments:  5/5 strength throughout. CN 2-12 intact.Equal grip strength.   ?Psychiatric:     ?   Behavior: Behavior normal.  ? ? ?ED Results / Procedures / Treatments   ?Labs ?(all labs ordered are listed, but only abnormal results are displayed) ?Labs Reviewed  ?COMPREHENSIVE METABOLIC PANEL - Abnormal; Notable for the following components:  ?    Result Value  ? CO2 21 (*)   ? Glucose, Bld 119 (*)   ? Calcium 8.8 (*)   ? Albumin 3.4 (*)   ? All other components within normal limits  ?CBC WITH DIFFERENTIAL/PLATELET - Abnormal; Notable for the following components:  ? WBC 12.3 (*)   ? RBC 4.15 (*)   ? Hemoglobin 12.9 (*)   ? HCT 37.7 (*)   ?  Neutro Abs 9.1 (*)   ? All other components within normal limits  ?URINALYSIS, ROUTINE W REFLEX MICROSCOPIC - Abnormal; Notable for the following components:  ? APPearance CLOUDY (*)   ? Hgb urine dipstick TRACE (*)   ? Nitrite POSITIVE (*)   ? Leukocytes,Ua MODERATE (*)   ? All other components within normal limits  ?URINALYSIS, MICROSCOPIC (REFLEX) - Abnormal; Notable for the following components:  ? Bacteria, UA MANY (*)   ? All other components within normal limits  ?RESP PANEL BY RT-PCR (FLU A&B, COVID) [id number]  ?CULTURE,  BLOOD (ROUTINE X 2)  ?CULTURE, BLOOD (ROUTINE X 2)  ?URINE CULTURE  ?LACTIC ACID, PLASMA  ?PROTIME-INR  ?APTT  ?LACTIC ACID, PLASMA  ? ? ?EKG ?EKG Interpretation ? ?Date/Time:  Saturday June 14 2021 00:58:59 EDT ?Ventricular Rate:  85 ?PR Interval:  160 ?QRS Duration: 90 ?QT Interval:  350 ?QTC Calculation: 417 ?R Axis:   49 ?Text Interpretation: Sinus rhythm No significant change was found Confirmed by Ezequiel Essex [phone]) on 06/14/2021 5:24:07 AM ? ?Radiology ?DG Chest 2 View ? ?Result Date: 06/14/2021 ?CLINICAL DATA:  Sepsis EXAM: CHEST - 2 VIEW COMPARISON:  01/10/2021 FINDINGS: Mild right basilar atelectasis. Lungs are otherwise clear. No pneumothorax or pleural effusion. Cardiac size within normal limits. Pulmonary vascularity is normal. Left total shoulder arthroplasty has been performed. IMPRESSION: No active cardiopulmonary disease. Electronically Signed   By: Fidela Salisbury M.D.   On: 06/14/2021 00:18  ? ?CT Renal Stone Study ? ?Result Date: 06/14/2021 ?CLINICAL DATA:  Flank pain. EXAM: CT ABDOMEN AND PELVIS WITHOUT CONTRAST TECHNIQUE: Multidetector CT imaging of the abdomen and pelvis was performed following the standard protocol without IV contrast. RADIATION DOSE REDUCTION: This exam was performed according to the departmental dose-optimization program which includes automated exposure control, adjustment of the mA and/or kV according to patient size and/or use of iterative  reconstruction technique. COMPARISON:  March 16, 2021 FINDINGS: Lower chest: No acute abnormality. Hepatobiliary: No focal liver abnormality is seen. No gallstones, gallbladder wall thickening, or biliary dilatation. Panc

## 2021-06-14 NOTE — H&P (Signed)
?History and Physical  ? ? ?Patient: Adrian Rios:[id number] DOB: [birth year] ?DOA: 06/13/2021 ?DOS: the patient was seen and examined on 06/14/2021 ?PCP: [name], MD  ?Patient coming from: Home ? ?Chief Complaint:  ?Chief Complaint  ?Patient presents with  ? Urinary Retention  ? Fever  ? ?HPI: Adrian Rios is a 81 y.o. male with medical history significant of prostate cancer, bladder cancer, asthma, recurrent UTI. Presenting with fevers and aches. He was in his normal state of health until 2 days ago. He started feeling generally weak and achy. He tried some APAP but that didn't help so much. His symptoms seem to worsen yesterday. He had fevers of 100.4. He notices the urine output from his ostomy had sediment in it. His wife became concerned and brought him to the ED for evaluation. He denies any other aggravating or alleviating factors.  ? ?Review of Systems: As mentioned in the history of present illness. All other systems reviewed and are negative. ?Past Medical History:  ?Diagnosis Date  ? Allergic rhinitis   ? Arthritis   ? Benign localized prostatic hyperplasia with lower urinary tract symptoms (LUTS)   ? Bladder tumor   ? Borderline glaucoma of right eye   ? Cancer [facility])   ? bladder and prostate  ? Carotid bruit   ? per duplex 03-11-2016 RICA 1-39%  ? Chronic throat clearing   ? COVID-19   ? DDD (degenerative disc disease), lumbar   ? Depression   ? ED (erectile dysfunction)   ? Elevated PSA   ? urologist-  dr [name]--- s/p  prostate bx's  ? GERD (gastroesophageal reflux disease)   ? Hematuria   ? History of chronic bronchitis   ? 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 s/p  MOH's nasal --  pre-melanoma  ? History of squamous cell carcinoma excision   ? several times  ? Hyperlipidemia   ? Pre-diabetes   ? Premature ventricular contractions (PVCs) (VPCs)   ? RAD (reactive airway disease)   ? 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 dx 1970's  s/p  deep neck lymph node bx's and lung  bx's  ? Sensorineural hearing loss (SNHL) of both ears   ? Sepsis ([place]) 09/2016  ? UTI (urinary tract infection) 08/2016  ? Wears glasses   ? Wears hearing aid   ? bilateral  ? ?Past Surgical History:  ?Procedure Laterality Date  ? APPENDECTOMY  2008  ? CARDIOVASCULAR STRESS TEST  05/27/2010  ? normal nuclear study w/ no ischemia/  normal LV function and wall motion , ef 60%  ? CYSTOSCOPY WITH INJECTION N/A 06/12/2016  ? Procedure: CYSTOSCOPY WITH INJECTION OF INDOCYANINE GREEN DYE;  Surgeon: Alexis Frock, MD;  Location: [facility];  Service: Urology;  Laterality: N/A;  ? DEEP NECK LYMPH NODE BIOPSY / EXCISION  1970's  ? and Bronchoscopy w/ bx's ( dx Sarcoidosis)  ? ILEOSTOMY    ? IR NEPHROSTOMY PLACEMENT LEFT  11/18/2016  ? MOHS SURGERY  2013 approx.  ?  nasal; pre melanoma  ? PARS PLANA VITRECTOMY Right 2007  ? repair macular pucker  ? TONSILLECTOMY AND ADENOIDECTOMY  child  ? TRANSURETHRAL RESECTION OF BLADDER TUMOR N/A 04/06/2016  ? Procedure: CYSTOSCOPY TRANSURETHRAL RESECTION OF BLADDER TUMORS (TURBT);  Surgeon: Carolan Clines, MD;  Location[facility];  Service: Urology;  Laterality: N/A;  ? TRANSURETHRAL RESECTION OF PROSTATE    ? ?Social History:  reports that he has  never smoked. He has never used smokeless tobacco. He reports that he does not drink alcohol and does not use drugs. ? ?Allergies  ?Allergen Reactions  ? Ivp Dye [Iodinated Contrast Media] Shortness Of Breath, Itching and Palpitations  ?  "eyes itching,  Heart racing,  Effected breathing" ?10-27-16 pt with 13 hr pre-meds for CT without any reaction-kj  ? Metrizamide Shortness Of Breath, Itching and Palpitations  ?   ?"eyes itching,  Heart racing,  Effected breathing" ?10-27-16 pt with 13 hr pre-meds for CT without any reaction-kj  ? Shellfish Allergy Hives, Shortness Of Breath and Itching  ?  Mostly crab  ? Gabapentin Other (See Comments)  ?  REACTION: dizziness and flushing  ? Niacin-Lovastatin Er Other (See Comments)  ?  Did not  feel good on medication  ? Gadolinium Hives  ? Adhesive [Tape] Rash  ?  Use paper tape  ? ? ?Family History  ?Problem Relation Age of Onset  ? Stroke Mother   ?     in her 80s  ? Breast cancer Sister   ? Heart disease Maternal Grandmother 63  ?     MI   ? Breast cancer Paternal Grandmother   ? Heart disease Paternal Grandfather 26  ?     MI  ? Stroke Brother   ? Healthy Father   ? Diabetes Neg Hx   ? ? ?Prior to Admission medications   ?Medication Sig Start Date End Date Taking? Authorizing Provider  ?acetaminophen (TYLENOL) 500 MG tablet Take 1,000 mg by mouth every 6 (six) hours as needed for headache or fever (pain).    [provider]  ?ADVAIR DISKUS 250-50 MCG/ACT AEPB Inhale 1 puff into the lungs 2 (two) times daily. 02/10/21   [provider]  ?albuterol (VENTOLIN HFA) 108 (90 Base) MCG/ACT inhaler Inhale 2 puffs into the lungs every 6 (six) hours as needed for wheezing or shortness of breath. 02/01/20   [name], MD  ?Fluticasone-Salmeterol 232-14 MCG/ACT AEPB Inhale 1 puff into the lungs in the morning and at bedtime. ?Patient taking differently: Inhale 1 puff into the lungs at bedtime. 12/22/20   [name], MD  ?latanoprost (XALATAN) 0.005 % ophthalmic solution Place 1 drop into the right eye at bedtime. 05/06/20   [provider]  ?magnesium oxide (MAG-OX) 400 MG tablet Take 400 mg by mouth daily with supper.    [provider]  ?methenamine (HIPREX) 1 g tablet Take 1 g by mouth 2 (two) times daily with a meal.    Burns, Claudina Lick, MD  ?Misc Natural Products (GLUCOSAMINE CHOND DOUBLE STR PO) Take 1 tablet by mouth 2 (two) times daily.    [provider]  ?montelukast (SINGULAIR) 10 MG tablet Take 1 tablet (10 mg total) by mouth at bedtime. 01/27/21   [name], MD  ?triamcinolone (NASACORT) 55 MCG/ACT AERO nasal inhaler Place 1 spray into the nose at bedtime as needed (congestion/nasal drainage).    [provider]  ?venlafaxine (EFFEXOR) 75  MG tablet Take 1.5 tablets (112.5 mg total) by mouth daily. ?Patient taking differently: Take 112.5 mg by mouth every morning. 01/08/21   [name], MD  ? ? ?Physical Exam: ?Vitals:  ? 06/14/21 0145 06/14/21 0507 06/14/21 0508 06/14/21 0602  ?BP: 129/79  117/74 124/89  ?Pulse: 85 91 91 93  ?Resp: (!) '23 17 20 16  '$ ?Temp:    98.1 ?F (36.7 ?C)  ?TempSrc:    Oral  ?SpO2: 95%  93% 91% 94%  ?Weight:    78.1 kg  ?Height:    '5\' 7"'$  (1.702 m)  ? ?General: 81 y.o. male resting in bed in NAD ?Eyes: PERRL, normal sclera ?ENMT: Nares patent w/o discharge, orophaynx clear, dentition normal, ears w/o discharge/lesions/ulcers ?Neck: Supple, trachea midline ?Cardiovascular: RRR, +S1, S2, no m/g/r, equal pulses throughout ?Respiratory: CTABL, no w/r/r, normal WOB ?GI: BS+, NDNT, no masses noted, no organomegaly noted, ostomy noted ?MSK: No e/c/c ?Neuro: A&O x 3, no focal deficits ?Psyc: Appropriate interaction and affect, calm/cooperative ? ?Data Reviewed: ? ?CO2  21 ?WBC  12.3 ?Hgb  12.9 ?UA positive ? ?CT renal stone: ?IMPRESSION: ?1. Stable findings consistent with prior transurethral resection of ?the urinary bladder and prior prostatectomy. ?2. Subcentimeter nonobstructing right renal calculi with stable ?bilateral hydronephrosis and hydroureter. ?3. Colonic diverticulosis. ?4. Aortic atherosclerosis. ? ?CXR: No active cardiopulmonary disease. ? ?Assessment and Plan: ?No notes have been filed under this hospital service. ?Service: Hospitalist ?UTI ?    - placed in med-surg obs; change to inpt ?    - has history of ESBL UTI; continue merrem for now; deescalate as soon as able ?    - follow Ucx, Bld Cx ?    - fluids, APAP PRN ?  ?Hx of prostate cancer ?Hx of of bladder cancer ?    - continue follow up with [facility] ? ?Depression ?    - continue home regimen ? ?Asthma ?    - continue home regimen ? ?Advance Care Planning:   Code Status: FULL ? ?Consults: None ? ?Family Communication: w/ wife at bedside ? ?Severity of Illness: ?The  appropriate patient status for this patient is INPATIENT. Inpatient status is judged to be reasonable and necessary in order to provide the required intensity of service to ensure the patient's safety.

## 2021-06-14 NOTE — Sepsis Progress Note (Signed)
Elink following Code Sepsis. 

## 2021-06-14 NOTE — ED Notes (Signed)
Per wife's request, she called and updated at this time.  ?

## 2021-06-14 NOTE — Progress Notes (Signed)
Pharmacy Antibiotic Note ? ?Adrian Rios is a 81 y.o. male admitted on 06/13/2021 with sepsis likely urinary source.  Pharmacy has been consulted for meropenem dosing. ? ?Plan: ?Meropenem 1g IV q8h ?Follow up renal function & cultures ?De-escalate coverage if ESBL if ruled out ? ?Height: '5\' 7"'$  (170.2 cm) ?Weight: 78.1 kg (172 lb 2.9 oz) ?IBW/kg (Calculated) : 66.1 ? ?Temp (24hrs), Avg:98.7 ?F (37.1 ?C), Min:98.1 ?F (36.7 ?C), Max:99.3 ?F (37.4 ?C) ? ?Recent Labs  ?Lab 06/13/21 ?2348  ?WBC 12.3*  ?CREATININE 1.13  ?LATICACIDVEN 0.8  ?  ?Estimated Creatinine Clearance: 48.7 mL/min (by C-G formula based on SCr of 1.13 mg/dL).   ? ?Allergies  ?Allergen Reactions  ? Ivp Dye [Iodinated Contrast Media] Shortness Of Breath, Itching and Palpitations  ?  "eyes itching,  Heart racing,  Effected breathing" ?10-27-16 pt with 13 hr pre-meds for CT without any reaction-kj  ? Metrizamide Shortness Of Breath, Itching and Palpitations  ?   ?"eyes itching,  Heart racing,  Effected breathing" ?10-27-16 pt with 13 hr pre-meds for CT without any reaction-kj  ? Shellfish Allergy Hives, Shortness Of Breath and Itching  ?  Mostly crab  ? Gabapentin Other (See Comments)  ?  REACTION: dizziness and flushing  ? Niacin-Lovastatin Er Other (See Comments)  ?  Did not feel good on medication  ? Gadolinium Hives  ? Adhesive [Tape] Rash  ?  Use paper tape  ? ? ?Antimicrobials this admission:  ?4/15 [facility] >> ? ?Dose adjustments this admission:  ? ?Microbiology results:  ?4/15 BCx: ?4/15 UCx: ? ?Previous: ?03/16/21 BCx: K. Pneumo (no carb resistance) in 1/4 bottles - R amp only ?03/16/21 UCx: >100k Kleb pneumo (R amp, I nitro) ?60k Proteus ?01/05/21 UCx: E.coli ESBL ?11/23/20 UCx: K.pneumo R amp only ? ?Thank you for allowing pharmacy to be a part of this patient?s care. ? ?Peggyann Juba, PharmD, BCPS ?Pharmacy: [phone] ?06/14/2021 9:49 AM ? ?

## 2021-06-15 ENCOUNTER — Encounter: Payer: Self-pay | Admitting: Internal Medicine

## 2021-06-15 DIAGNOSIS — Z8546 Personal history of malignant neoplasm of prostate: Secondary | ICD-10-CM

## 2021-06-15 DIAGNOSIS — Z8551 Personal history of malignant neoplasm of bladder: Secondary | ICD-10-CM

## 2021-06-15 DIAGNOSIS — N390 Urinary tract infection, site not specified: Secondary | ICD-10-CM | POA: Diagnosis not present

## 2021-06-15 DIAGNOSIS — J4520 Mild intermittent asthma, uncomplicated: Secondary | ICD-10-CM | POA: Diagnosis not present

## 2021-06-15 DIAGNOSIS — F3289 Other specified depressive episodes: Secondary | ICD-10-CM

## 2021-06-15 DIAGNOSIS — A419 Sepsis, unspecified organism: Secondary | ICD-10-CM

## 2021-06-15 LAB — COMPREHENSIVE METABOLIC PANEL
ALT: 14 U/L (ref 0–44)
AST: 15 U/L (ref 15–41)
Albumin: 3 g/dL — ABNORMAL LOW (ref 3.5–5.0)
Alkaline Phosphatase: 78 U/L (ref 38–126)
Anion gap: 7 (ref 5–15)
BUN: 16 mg/dL (ref 8–23)
CO2: 25 mmol/L (ref 22–32)
Calcium: 8.7 mg/dL — ABNORMAL LOW (ref 8.9–10.3)
Chloride: 109 mmol/L (ref 98–111)
Creatinine, Ser: 1.07 mg/dL (ref 0.61–1.24)
GFR, Estimated: >60 mL/min (ref >60)
Glucose, Bld: 144 mg/dL — ABNORMAL HIGH (ref 70–99)
Potassium: 3.6 mmol/L (ref 3.5–5.1)
Sodium: 141 mmol/L (ref 135–145)
Total Bilirubin: 0.7 mg/dL (ref 0.3–1.2)
Total Protein: 6.4 g/dL — ABNORMAL LOW (ref 6.5–8.1)

## 2021-06-15 LAB — CBC
HCT: 35.5 % — ABNORMAL LOW (ref 39.0–52.0)
Hemoglobin: 11.6 g/dL — ABNORMAL LOW (ref 13.0–17.0)
MCH: 30.9 pg (ref 26.0–34.0)
MCHC: 32.7 g/dL (ref 30.0–36.0)
MCV: 94.4 fL (ref 80.0–100.0)
Platelets: 314 10*3/uL (ref 150–400)
RBC: 3.76 MIL/uL — ABNORMAL LOW (ref 4.22–5.81)
RDW: 13.3 % (ref 11.5–15.5)
WBC: 8.9 10*3/uL (ref 4.0–10.5)
nRBC: 0 % (ref 0.0–0.2)

## 2021-06-15 NOTE — Assessment & Plan Note (Signed)
Continue home Effexor. ?

## 2021-06-15 NOTE — Assessment & Plan Note (Signed)
Management as above °

## 2021-06-15 NOTE — TOC Initial Note (Signed)
Transition of Care (TOC) - Initial/Assessment Note  ? ? ?Patient Details  ?Name: Adrian Rios ?MRN: [id number] ?Date of Birth: 1940-10-15 ? ?Transition of Care (TOC) CM/SW Contact:    ?Tawanna Cooler, RN ?Phone Number: ?06/15/2021, 2:26 PM ? ?Clinical Narrative:                 ? ?Patient from home with wife.  Has a urostomy.  TOC following for any discharge needs.  ? ? ?Expected Discharge Plan: Home/Self Care ?Barriers to Discharge: Continued Medical Work up ? ? ? ?Expected Discharge Plan and Services ?Expected Discharge Plan: Home/Self Care ?  ?  ?  ?Living arrangements for the past 2 months: [place] ?                ? ?Prior Living Arrangements/Services ?Living arrangements for the past 2 months: [place] ?Lives with:: Spouse ?Patient language and need for interpreter reviewed:: Yes ?       ?Need for Family Participation in Patient Care: Yes (Comment) ?Care giver support system in place?: Yes (comment) ?  ?Criminal Activity/Legal Involvement Pertinent to Current Situation/Hospitalization: No - Comment as needed ? ?Activities of Daily Living ?Home Assistive Devices/Equipment: Hearing aid ?ADL Screening (condition at time of admission) ?Patient's cognitive ability adequate to safely complete daily activities?: Yes ?Is the patient deaf or have difficulty hearing?: Yes ?Does the patient have difficulty seeing, even when wearing glasses/contacts?: No ?Does the patient have difficulty concentrating, remembering, or making decisions?: No ?Patient able to express need for assistance with ADLs?: Yes ?Does the patient have difficulty dressing or bathing?: No ?Independently performs ADLs?: Yes (appropriate for developmental age) ?Does the patient have difficulty walking or climbing stairs?: No ?Weakness of Legs: None ?Weakness of Arms/Hands: None ? ?Emotional Assessment ?  ?Orientation: : Oriented to Self, Oriented to Place, Oriented to  Time, Oriented to Situation ?Alcohol / Substance Use: Not  Applicable ?Psych Involvement: No (comment) ? ?Admission diagnosis:  UTI (urinary tract infection) [N39.0] ?Complicated UTI (urinary tract infection) [N39.0] ?Sepsis with acute organ dysfunction without septic shock, due to unspecified organism, unspecified type ([place]) [A41.9, R65.20] ?Patient Active Problem List  ? Diagnosis Date Noted  ? Left shoulder pain 05/01/2021  ? Right flank pain 03/17/2021  ? CKD (chronic kidney disease) stage 3, GFR 30-59 ml/min (HCC) 03/17/2021  ? Pyelonephritis 03/16/2021  ? Complicated UTI (urinary tract infection) 11/24/2020  ? Sepsis due to complicated UTI and patient with history of ESBL UTI 11/24/2020  ? History of ESBL E. coli infection 11/24/2020  ? Hydronephrosis 11/24/2020  ? Cough 09/06/2020  ? Low back pain 08/03/2020  ? Bilateral leg edema 02/16/2020  ? Chronic pain of both shoulders 08/04/2019  ? COVID-19 02/28/2019  ? Dizziness 06/21/2018  ? Gait disorder 01/24/2018  ? Left hip pain 11/11/2017  ? Abdominal wall hernia 05/26/2017  ? Recurrent urinary tract infection 11/13/2016  ? Chronic diastolic CHF (congestive heart failure) ([place]) 11/12/2016  ? Fatigue 08/19/2016  ? History of bladder cancer 06/18/2016  ? History of prostate cancer 06/18/2016  ? 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 Carotid bruit 02/17/2016  ? Prediabetes 08/20/2015  ? Glaucoma 02/14/2015  ? Varicose veins of lower extremities with other complications 00/37/0488  ? Asthma 10/27/2011  ? 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[place]), 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  ? PREMATURE VENTRICULAR CONTRACTIONS 04/29/2010  ? ERECTILE DYSFUNCTION 04/23/2010  ? [place] DISEASE, LUMBOSACRAL SPINE 02/06/2010  ? 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  ? Depression 04/12/2008  ? Allergic rhinitis 04/12/2008  ?  Hyperlipidemia 10/05/2006  ? Sarcoidosis ([place]) 07/05/2006  ? ?PCP:  [name], MD ?Pharmacy:   ?[id number] - [place], [place] ?[address] ?[name] [address] ?Phone: [phone] Fax:  [phone] ? ? ?

## 2021-06-15 NOTE — Assessment & Plan Note (Addendum)
S/p robotic cystoprostatectomy and ileal conduit in 05/2016.  Followed at [facility].  CT renal stone study with stable bilateral hydronephrosis.  Clear urine in urostomy bag. ?-Outpatient follow-up ?

## 2021-06-15 NOTE — Plan of Care (Signed)
  Problem: Pain Managment: Goal: General experience of comfort will improve Outcome: Progressing   Problem: Elimination: Goal: Will not experience complications related to bowel motility Outcome: Progressing Goal: Will not experience complications related to urinary retention Outcome: Progressing   

## 2021-06-15 NOTE — Plan of Care (Signed)
?  Problem: Clinical Measurements: ?Goal: Will remain free from infection ?Outcome: Progressing ?  ?Problem: Clinical Measurements: ?Goal: Diagnostic test results will improve ?Outcome: Progressing ?  ?Problem: Clinical Measurements: ?Goal: Respiratory complications will improve ?Outcome: Progressing ?  ?Problem: Nutrition: ?Goal: Adequate nutrition will be maintained ?Outcome: Progressing ?  ?Problem: Safety: ?Goal: Ability to remain free from injury will improve ?Outcome: Progressing ?  ?

## 2021-06-15 NOTE — Assessment & Plan Note (Addendum)
Received IV meropenem and first dose of fosfomycin in house and discharged on additional dose of fosfomycin on 06/19/2021 per ID recommendation.  ID to arrange outpatient follow-up. ?

## 2021-06-15 NOTE — Assessment & Plan Note (Signed)
Continue home inhalers 

## 2021-06-15 NOTE — Assessment & Plan Note (Signed)
S/p cystoprostatectomy at [facility] in 06/2026. 

## 2021-06-15 NOTE — Progress Notes (Signed)
?PROGRESS NOTE ? ?Adrian Rios JYN:[id number] DOB: October 27, 1940  ? ?PCP: [name], MD ? ?Patient is from: Home.  Lives with his wife. ? ?DOA: 06/13/2021 LOS: 1 ? ?Chief complaints ?Chief Complaint  ?Patient presents with  ? Urinary Retention  ? Fever  ?  ? ?Brief Narrative / Interim history: ?81 year old M with PMH of prostate and bladder cancer s/p s/p robotic cystoprostatectomy and ileal conduit in 05/2016,  recurrent UTI with ESBL, asthma and depression presenting with body aches, generalized weakness and fever to 100.4, and admitted for complicated UTI.  He had mild leukocytosis with tachypnea.  Lactic acid within normal.  UA with moderate LE, positive nitrite and many bacteria.  CT abdomen and pelvis with stable finding consistent with prior transurethral resection of the urinary bladder and prior prostatectomy, subcentimeter nonobstructing right renal colliculi with stable bilateral hydronephrosis and hydroureter.  Urine and blood cultures obtained.  Started on meropenem ? ?Urine culture with GNR.  Sepsis physiology resolved.  Feels well.  Remains on IV meropenem pending urine culture speciation and sensitivity  ? ?Subjective: ?Seen and examined earlier this morning.  No major events overnight of this morning.  Feels well.  Eager to go home.  He denies pain, body aches, nausea and vomiting.  He reports good and clean urine output. ? ?Objective: ?Vitals:  ? 06/14/21 1840 06/14/21 2036 06/15/21 8657 06/15/21 0911  ?BP:  126/70 124/82   ?Pulse:  75 79   ?Resp:  20 18   ?Temp:  98.3 ?F (36.8 ?C) 98.8 ?F (37.1 ?C)   ?TempSrc:  Oral Oral   ?SpO2: 95% 93% 95% 95%  ?Weight:      ?Height:      ? ? ?Examination: ? ?GENERAL: No apparent distress.  Nontoxic. ?HEENT: MMM.  Vision and hearing grossly intact.  ?NECK: Supple.  No apparent JVD.  ?RESP:  No IWOB.  Fair aeration bilaterally. ?CVS:  RRR. Heart sounds normal.  ?ABD/GI/GU: BS+. Abd soft, NTND.  Urostomy with clear urine. ?MSK/EXT:  Moves extremities. No apparent  deformity. No edema.  ?SKIN: no apparent skin lesion or wound ?NEURO: Awake, alert and oriented appropriately.  No apparent focal neuro deficit. ?PSYCH: Calm. Normal affect.  ? ?Procedures:  ?None ? ?Microbiology summarized: ?COVID-19 and influenza PCR nonreactive. ?Blood cultures NGTD. ?Urine culture GNR. ? ?Assessment and Plan: ?* Sepsis due to complicated UTI and patient with history of ESBL UTI ?POA.  Presents with fever, generalized body pain and weakness.  Had leukocytosis and tachypnea on presentation.  UA consistent with UTI.   CT renal stone study without acute finding but chronic bilateral hydronephrosis and surgical change from cystectomy and prostatectomy.  Sepsis physiology resolved.  Feels well.  Blood cultures NGTD.  Urine culture with GNR. ?-Continue IV meropenem pending urine culture speciation and sensitivity ? ?Complicated UTI (urinary tract infection) ?Management as above. ? ?History of prostate cancer ?S/p cystoprostatectomy at [facility] in 06/2026. ? ?History of bladder cancer ?S/p robotic cystoprostatectomy and ileal conduit in 05/2016.  Followed at [facility].  CT renal stone study with stable bilateral hydronephrosis.  Clear urine in urostomy bag. ?-Outpatient follow-up ? ?Asthma ?Continue home inhalers. ? ?Depression ?Continue home Effexor. ? ? ? ?DVT prophylaxis:  ?enoxaparin (LOVENOX) injection 40 mg Start: 06/14/21 1330 ? ?Code Status: Full code ?Family Communication: Updated patient's wife at bedside. ?Level of care: Med-Surg ?Status is: Inpatient ?Remains inpatient appropriate because: Sepsis due to complicated UTI in patient with history of ESBL UTI requiring IV meropenem. ? ? ?Final  disposition: Home once medically cleared. ?Consultants:  ?None ? ?Sch Meds:  ?Scheduled Meds: ? enoxaparin (LOVENOX) injection  40 mg Subcutaneous Q24H  ? latanoprost  1 drop Right Eye QHS  ? magnesium oxide  400 mg Oral Q supper  ? mometasone-formoterol  2 puff Inhalation BID  ? montelukast  10 mg Oral QHS  ?  triamcinolone  1 spray Nasal QHS  ? venlafaxine  112.5 mg Oral Daily  ? ?Continuous Infusions: ? meropenem (MERREM) IV 200 mL/hr at 06/15/21 1017  ? ?PRN Meds:.acetaminophen **OR** acetaminophen, albuterol, ondansetron **OR** ondansetron (ZOFRAN) IV ? ?Antimicrobials: ?Anti-infectives (From admission, onward)  ? ? Start     Dose/Rate Route Frequency Ordered Stop  ? 06/14/21 1700  methenamine (HIPREX) tablet 1 g  Status:  Discontinued       ? 1 g Oral 2 times daily with meals 06/14/21 1242 06/14/21 1300  ? 06/14/21 1030  meropenem (MERREM) 1 g in sodium chloride 0.9 % 100 mL IVPB       ? 1 g ?200 mL/hr over 30 Minutes Intravenous Every 8 hours 06/14/21 0938    ? 06/14/21 0130  meropenem (MERREM) 1 g in sodium chloride 0.9 % 100 mL IVPB       ? 1 g ?200 mL/hr over 30 Minutes Intravenous  Once 06/14/21 0118 06/14/21 0255  ? ?  ? ? ? ?I have personally reviewed the following labs and images: ?CBC: ?Recent Labs  ?Lab 06/13/21 ?2348 06/15/21 ?0330  ?WBC 12.3* 8.9  ?NEUTROABS 9.1*  --   ?HGB 12.9* 11.6*  ?HCT 37.7* 35.5*  ?MCV 90.8 94.4  ?PLT 349 314  ? ?BMP &GFR ?Recent Labs  ?Lab 06/13/21 ?2348 06/15/21 ?0330  ?NA 138 141  ?K 3.7 3.6  ?CL 109 109  ?CO2 21* 25  ?GLUCOSE 119* 144*  ?BUN 22 16  ?CREATININE 1.13 1.07  ?CALCIUM 8.8* 8.7*  ? ?Estimated Creatinine Clearance: 51.5 mL/min (by C-G formula based on SCr of 1.07 mg/dL). ?Liver & Pancreas: ?Recent Labs  ?Lab 06/13/21 ?2348 06/15/21 ?0330  ?AST 16 15  ?ALT 15 14  ?ALKPHOS 93 78  ?BILITOT 0.6 0.7  ?PROT 7.6 6.4*  ?ALBUMIN 3.4* 3.0*  ? ?No results for input(s): LIPASE, AMYLASE in the last 168 hours. ?No results for input(s): AMMONIA in the last 168 hours. ?Diabetic: ?No results for input(s): HGBA1C in the last 72 hours. ?No results for input(s): GLUCAP in the last 168 hours. ?Cardiac Enzymes: ?No results for input(s): CKTOTAL, CKMB, CKMBINDEX, TROPONINI in the last 168 hours. ?No results for input(s): PROBNP in the last 8760 hours. ?Coagulation Profile: ?Recent Labs   ?Lab 06/13/21 ?2349  ?INR 1.1  ? ?Thyroid Function Tests: ?No results for input(s): TSH, T4TOTAL, FREET4, T3FREE, THYROIDAB in the last 72 hours. ?Lipid Profile: ?No results for input(s): CHOL, HDL, LDLCALC, TRIG, CHOLHDL, LDLDIRECT in the last 72 hours. ?Anemia Panel: ?No results for input(s): VITAMINB12, FOLATE, FERRITIN, TIBC, IRON, RETICCTPCT in the last 72 hours. ?Urine analysis: ?   ?Component Value Date/Time  ? [place] YELLOW 06/14/2021 0052  ? APPEARANCEUR CLOUDY (A) 06/14/2021 0052  ? LABSPEC 1.020 06/14/2021 0052  ? PHURINE 6.0 06/14/2021 0052  ? GLUCOSEU NEGATIVE 06/14/2021 0052  ? GLUCOSEU NEGATIVE 03/06/2021 1448  ? HGBUR TRACE (A) 06/14/2021 0052  ? HGBUR negative 07/11/2009 0813  ? [place] NEGATIVE 06/14/2021 0052  ? [place] NEGATIVE 06/14/2021 0052  ? [place] NEGATIVE 06/14/2021 0052  ? UROBILINOGEN 0.2 03/06/2021 1448  ? NITRITE POSITIVE (A) 06/14/2021 0052  ?  LEUKOCYTESUR MODERATE (A) 06/14/2021 0052  ? ?Sepsis Labs: ?Invalid input(s): PROCALCITONIN, LACTICIDVEN ? ?Microbiology: ?Recent Results (from the past 240 hour(s))  ?Culture, blood (Routine x 2)     Status: None (Preliminary result)  ? Collection Time: 06/13/21 11:40 PM  ? Specimen: BLOOD LEFT HAND  ?Result Value Ref Range Status  ? Specimen Description   Final  ?  BLOOD LEFT HAND ?Performed at [facility], [address], [place], [place] [address] ?  ? Special Requests   Final  ?  BOTTLES DRAWN AEROBIC AND ANAEROBIC Blood Culture adequate volume ?Performed at [facility], [address] [place], [place] [address] ?  ? Culture   Final  ?  NO GROWTH < 24 HOURS ?Performed at [facility], [place] [address], [place], [place] [address] ?  ? Report Status PENDING  Incomplete  ?Urine Culture     Status: Abnormal (Preliminary result)  ? Collection Time: 06/14/21 12:52 AM  ? Specimen: In/Out Cath Urine  ?Result Value Ref Range Status  ? Specimen Description   Final  ?  IN/OUT CATH URINE ?Performed at [facility], [address], [place], [place] [address] ?  ? Special Requests   Final  ?  NONE ?Performed at [facility], [address] [place], [place] [id number] ?  ? Culture >=100,000 COLONIES

## 2021-06-15 NOTE — Hospital Course (Addendum)
81 year old M with PMH of prostate and bladder cancer s/p s/p robotic cystoprostatectomy and ileal conduit in 05/2016,  recurrent UTI with ESBL, asthma and depression presenting with body aches, generalized weakness and fever to 100.4, and admitted for complicated UTI.  He had mild leukocytosis with tachypnea.  Lactic acid within normal.  UA with moderate LE, positive nitrite and many bacteria.  CT abdomen and pelvis with stable finding consistent with prior transurethral resection of the urinary bladder and prior prostatectomy, subcentimeter nonobstructing right renal colliculi with stable bilateral hydronephrosis and hydroureter.  Urine and blood cultures obtained.  Started on meropenem ? ?The next day, patient felt well.  He was continued on IV meropenem. ? ?On the day of discharge, patient continued to do well.  Urine culture with ESBL E. coli and Enterococcus faecalis.  Infectious disease consulted, and changed antibiotic to fosfomycin.  He received his first dose prior to discharge.  He is discharged on additional dose of fosfomycin on 06/19/2021.  Prior authorization completed by ID pharmacy.  ID to arrange outpatient follow-up.  Patient to resume his methenamine in about 5 days. ?

## 2021-06-16 ENCOUNTER — Other Ambulatory Visit (HOSPITAL_COMMUNITY): Payer: Self-pay

## 2021-06-16 DIAGNOSIS — J4520 Mild intermittent asthma, uncomplicated: Secondary | ICD-10-CM | POA: Diagnosis not present

## 2021-06-16 DIAGNOSIS — F3289 Other specified depressive episodes: Secondary | ICD-10-CM | POA: Diagnosis not present

## 2021-06-16 DIAGNOSIS — A419 Sepsis, unspecified organism: Secondary | ICD-10-CM | POA: Diagnosis not present

## 2021-06-16 DIAGNOSIS — N390 Urinary tract infection, site not specified: Secondary | ICD-10-CM | POA: Diagnosis not present

## 2021-06-16 LAB — URINE CULTURE: Culture: >=100000 — AB

## 2021-06-16 LAB — RENAL FUNCTION PANEL
Albumin: 3 g/dL — ABNORMAL LOW (ref 3.5–5.0)
Anion gap: 7 (ref 5–15)
BUN: 17 mg/dL (ref 8–23)
CO2: 25 mmol/L (ref 22–32)
Calcium: 8.6 mg/dL — ABNORMAL LOW (ref 8.9–10.3)
Chloride: 111 mmol/L (ref 98–111)
Creatinine, Ser: 0.8 mg/dL (ref 0.61–1.24)
GFR, Estimated: >60 mL/min (ref >60)
Glucose, Bld: 109 mg/dL — ABNORMAL HIGH (ref 70–99)
Phosphorus: 3 mg/dL (ref 2.5–4.6)
Potassium: 3.8 mmol/L (ref 3.5–5.1)
Sodium: 143 mmol/L (ref 135–145)

## 2021-06-16 LAB — CBC
HCT: 36.2 % — ABNORMAL LOW (ref 39.0–52.0)
Hemoglobin: 11.7 g/dL — ABNORMAL LOW (ref 13.0–17.0)
MCH: 30.4 pg (ref 26.0–34.0)
MCHC: 32.3 g/dL (ref 30.0–36.0)
MCV: 94 fL (ref 80.0–100.0)
Platelets: 327 10*3/uL (ref 150–400)
RBC: 3.85 MIL/uL — ABNORMAL LOW (ref 4.22–5.81)
RDW: 13.1 % (ref 11.5–15.5)
WBC: 8 10*3/uL (ref 4.0–10.5)
nRBC: 0 % (ref 0.0–0.2)

## 2021-06-16 LAB — MAGNESIUM: Magnesium: 2.2 mg/dL (ref 1.7–2.4)

## 2021-06-16 MED ORDER — METHENAMINE HIPPURATE 1 G PO TABS: 1.0000 g | ORAL_TABLET | Freq: Two times a day (BID) | ORAL | Status: AC

## 2021-06-16 MED ORDER — FOSFOMYCIN TROMETHAMINE 3 G PO PACK: 3.0000 g | PACK | Freq: Once | ORAL | 0 refills | Status: AC

## 2021-06-16 MED ORDER — FOSFOMYCIN TROMETHAMINE 3 G PO PACK
3.0000 g | PACK | ORAL | Status: DC
  Administered 2021-06-16: 3 g via ORAL
  Filled 2021-06-16: qty 3

## 2021-06-16 MED ORDER — FOSFOMYCIN TROMETHAMINE 3 G PO PACK
3.0000 g | PACK | Freq: Once | ORAL | Status: DC
  Filled 2021-06-16: qty 3

## 2021-06-16 NOTE — Plan of Care (Signed)
?  Problem: Coping: ?Goal: Level of anxiety will decrease ?Outcome: Progressing ?  ?Problem: Safety: ?Goal: Ability to remain free from injury will improve ?Outcome: Progressing ?  ?Problem: Pain Managment: ?Goal: General experience of comfort will improve ?Outcome: Progressing ?  ?Problem: Elimination: ?Goal: Will not experience complications related to bowel motility ?Outcome: Progressing ?Goal: Will not experience complications related to urinary retention ?Outcome: Progressing ?  ?

## 2021-06-16 NOTE — Plan of Care (Signed)
Patient discharged home with wife via private vehicle following receipt of Fosfomycin directions from Infectious Disease and verification that Rx sent to [facility]. ?Ivan Anchors, RN ?06/16/21 ?6:15 PM ? ?

## 2021-06-16 NOTE — Plan of Care (Signed)
Problem: Education: ?Goal: Knowledge of General Education information will improve ?Description: Including pain rating scale, medication(s)/side effects and non-pharmacologic comfort measures ?Outcome: Progressing ?  ?Problem: Clinical Measurements: ?Goal: Ability to maintain clinical measurements within normal limits will improve ?Outcome: Progressing ?  ?Problem: Activity: ?Goal: Risk for activity intolerance will decrease ?Outcome: Progressing ?  ?Ivan Anchors, RN ?06/16/21 ?8:35 AM ? ?

## 2021-06-16 NOTE — Progress Notes (Signed)
PT Cancellation Note ? ?Patient Details ?Name: Adrian Rios ?MRN: [id number] ?DOB: Apr 18, 1940 ? ? ?Cancelled Treatment:    Reason Eval/Treat Not Completed: PT screened, no needs identified, will sign off; amb independently per OT notes and mobility progression ? ? ?[name] ?06/16/2021, 2:33 PM ?

## 2021-06-16 NOTE — TOC Benefit Eligibility Note (Signed)
Patient Advocate Encounter ? ?Prior Authorization for Fosfomycin Tromethamine 3GM packets has been approved.   ? ? ?Effective dates: 06/16/2021 through 06/17/2022 ? ? ? ? ? ?Lyndel Safe, CPhT ?Pharmacy Patient Advocate Specialist ?[facility] Patient Advocate Team ?Direct Number: [phone]  Fax: [phone]  ?

## 2021-06-16 NOTE — Consult Note (Signed)
?   ? ? ? ? ?[facility]   ? ?Date of Admission:  06/13/2021    ? ?Reason for Consult: sepsis/uti    ?Referring Provider: Cyndia Skeeters ? ? ?Abx: ?4/14-c meropenem      ? ? ?Assessment: ?81 yo male with hx bladder/prostate cancer s/p transurethral resection of both, and ileal conduit creation, recurrent uti, admitted with sepsis in setting of likely another complicated uti  ? ?Bcx negative ?Ucx with esbl ecoli (R bactrim/cipro), and e faecalis ? ?Done well/sepsis resolved on meropenem empirically ? ?Agree likely complicated uti. Suspect ecoli main pathogenic organism. Often e faecalis is a by product of multiple/long abx course ? ?Given clinical improvement, he can finish course of tx with fosfomycin ? ?He has been on methenamine for prophylaxis and can continue that ? ?Plan: ?He can have 2 more doses of fosfomycin to finish abx course for complicated uti. One dose today and another in 72 hours ?Continue methenamine ?Would defer prophylactic abx at this time ?No need for id clinic follow up ?Discussed with dr Cyndia Skeeters  ? ? ? ? ?------------------------------------------------ ?Principal Problem: ?  Sepsis due to complicated UTI and patient with history of ESBL UTI ?Active Problems: ?  Depression ?  Asthma ?  History of bladder cancer ?  History of prostate cancer ?  Complicated UTI (urinary tract infection) ? ? ? ?HPI: Adrian Rios is a 81 y.o. male with hx bladder/prostate cancer s/p transurethral resection of both, and ileal conduit creation, recurrent uti, admitted with sepsis in setting of likely another complicated uti  ? ?Patient said he has had several bouts of uti in the past. Sx include body ache, malaise, fever/chill. He doesn't produce urine from below, but everything comes out of ileal conduit. He denies flank pain in past or now ? ?He had a fever 100.4 at home ? ?On admission has mild leukocytosis. Afebrile here. Urine "in and out" with esbl ecoli and e faecalis ?Bcx negative ? ?His  sepsis had resolved with empiric meropenem ?He has no diarrhea, n/v/abd pain, rash, joint pain, cough, chest pain, sob ? ?He is ready to go home ? ?Family History  ?Problem Relation Age of Onset  ? Stroke Mother   ?     in her 2s  ? Breast cancer Sister   ? Heart disease Maternal Grandmother 27  ?     MI   ? Breast cancer Paternal Grandmother   ? Heart disease Paternal Grandfather 78  ?     MI  ? Stroke Brother   ? Healthy Father   ? Diabetes Neg Hx   ? ? ?Social History  ? ?Tobacco Use  ? Smoking status: Never  ? Smokeless tobacco: Never  ?Vaping Use  ? Vaping Use: Never used  ?Substance Use Topics  ? Alcohol use: No  ? Drug use: No  ? ? ?Allergies  ?Allergen Reactions  ? Ivp Dye [Iodinated Contrast Media] Shortness Of Breath, Itching and Palpitations  ?  "eyes itching,  Heart racing,  Effected breathing" ?10-27-16 pt with 13 hr pre-meds for CT without any reaction-kj  ? Metrizamide Shortness Of Breath, Itching and Palpitations  ?   ?"eyes itching,  Heart racing,  Effected breathing" ?10-27-16 pt with 13 hr pre-meds for CT without any reaction-kj  ? Shellfish Allergy Hives, Shortness Of Breath and Itching  ?  Mostly crab  ? Gabapentin Other (See Comments)  ?  REACTION: dizziness and flushing  ? Niacin-Lovastatin Er Other (See  Comments)  ?  Did not feel good on medication  ? Gadolinium Hives  ? Adhesive [Tape] Rash  ?  Use paper tape  ? ? ?Review of Systems: ?ROS ?All Other ROS was negative, except mentioned above ? ? ?Past Medical History:  ?Diagnosis Date  ? Allergic rhinitis   ? Arthritis   ? Benign localized prostatic hyperplasia with lower urinary tract symptoms (LUTS)   ? Bladder tumor   ? Borderline glaucoma of right eye   ? [facility])   ? bladder and prostate  ? Carotid bruit   ? per duplex 03-11-2016 RICA 1-39%  ? Chronic throat clearing   ? COVID-19   ? DDD (degenerative disc disease), lumbar   ? Depression   ? ED (erectile dysfunction)   ? Elevated PSA   ? urologist-  dr [name]--- s/p  prostate bx's   ? GERD (gastroesophageal reflux disease)   ? Hematuria   ? History of chronic bronchitis   ? 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 s/p  MOH's nasal --  pre-melanoma  ? History of squamous cell carcinoma excision   ? several times  ? Hyperlipidemia   ? Pre-diabetes   ? Premature ventricular contractions (PVCs) (VPCs)   ? RAD (reactive airway disease)   ? 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 dx 1970's  s/p  deep neck lymph node bx's and lung bx's  ? Sensorineural hearing loss (SNHL) of both ears   ? Sepsis ([place]) 09/2016  ? UTI (urinary tract infection) 08/2016  ? Wears glasses   ? Wears hearing aid   ? bilateral  ? ? ? ? ? ?Scheduled Meds: ? enoxaparin (LOVENOX) injection  40 mg Subcutaneous Q24H  ? fosfomycin  3 g Oral Q72H  ? latanoprost  1 drop Right Eye QHS  ? magnesium oxide  400 mg Oral Q supper  ? mometasone-formoterol  2 puff Inhalation BID  ? montelukast  10 mg Oral QHS  ? triamcinolone  1 spray Nasal QHS  ? venlafaxine  112.5 mg Oral Daily  ? ?Continuous Infusions: ?PRN Meds:.acetaminophen **OR** acetaminophen, albuterol, ondansetron **OR** ondansetron (ZOFRAN) IV ? ? ?OBJECTIVE: ?Blood pressure 117/83, pulse 73, temperature 97.9 ?F (36.6 ?C), resp. rate 16, height '5\' 7"'$  (1.702 m), weight 78.1 kg, SpO2 94 %. ? ?Physical Exam ? ?General/constitutional: no distress, pleasant ?HEENT: Normocephalic, PER, Conj Clear, EOMI, Oropharynx clear ?Neck supple ?CV: rrr no mrg ?Lungs: clear to auscultation, normal respiratory effort ?Abd: Soft, Nontender ?Ext: no edema ?Skin: No Rash ?Neuro: nonfocal ?Gu: right sided abdominal ileal-conduit stomy with clear yellow urine ?MSK: no peripheral joint swelling/tenderness/warmth; back spines nontender ?Psych: alert/oriented ? ? ?Lab Results ?Lab Results  ?Component Value Date  ? WBC 8.0 06/16/2021  ? HGB 11.7 (L) 06/16/2021  ? HCT 36.2 (L) 06/16/2021  ? MCV 94.0 06/16/2021  ? PLT 327 06/16/2021  ?  ?Lab Results  ?Component Value Date  ? CREATININE 0.80  06/16/2021  ? BUN 17 06/16/2021  ? NA 143 06/16/2021  ? K 3.8 06/16/2021  ? CL 111 06/16/2021  ? CO2 25 06/16/2021  ?  ?Lab Results  ?Component Value Date  ? ALT 14 06/15/2021  ? AST 15 06/15/2021  ? ALKPHOS 78 06/15/2021  ? BILITOT 0.7 06/15/2021  ?  ? ? ?Microbiology: ?Recent Results (from the past 240 hour(s))  ?Culture, blood (Routine x 2)     Status: None (Preliminary result)  ? Collection Time: 06/13/21 11:40 PM  ?  Specimen: BLOOD LEFT HAND  ?Result Value Ref Range Status  ? Specimen Description   Final  ?  BLOOD LEFT HAND ?Performed at [facility], [address] [place], [place] [address] ?  ? Special Requests   Final  ?  BOTTLES DRAWN AEROBIC AND ANAEROBIC Blood Culture adequate volume ?Performed at [facility], [address] [place], [place] [address] ?  ? Culture   Final  ?  NO GROWTH 2 DAYS ?Performed at [facility], [place] [address], [place], [place] [address] ?  ? Report Status PENDING  Incomplete  ?Urine Culture     Status: Abnormal  ? Collection Time: 06/14/21 12:52 AM  ? Specimen: In/Out Cath Urine  ?Result Value Ref Range Status  ? Specimen Description   Final  ?  IN/OUT CATH URINE ?Performed at [facility], [address], [place], [place] [address] ?  ? Special Requests   Final  ?  NONE ?Performed at [facility], [address] [place], [place] [address] ?  ? Culture (A)  Final  ?  >=100,000 COLONIES/mL ESCHERICHIA COLI ?30,000 COLONIES/mL ENTEROCOCCUS FAECALIS ?Confirmed Extended Spectrum Beta-Lactamase Producer (ESBL).  In bloodstream infections from ESBL organisms, carbapenems are preferred over piperacillin/tazobactam. They are shown to have a lower risk of mortality. ?  ? Report Status 06/16/2021 FINAL  Final  ? Organism ID, Bacteria ESCHERICHIA COLI (A)  Final  ? Organism ID, Bacteria ENTEROCOCCUS FAECALIS (A)  Final  ?    Susceptibility  ? Escherichia coli - MIC*  ?  AMPICILLIN >=32 RESISTANT Resistant   ?  CEFAZOLIN >=64 RESISTANT  Resistant   ?  CEFEPIME 16 RESISTANT Resistant   ?  CEFTRIAXONE >=64 RESISTANT Resistant   ?  CIPROFLOXACIN >=4 RESISTANT Resistant   ?  GENTAMICIN <=1 SENSITIVE Sensitive   ?  IMIPENEM <=0.25 SENSITIVE Sensit

## 2021-06-16 NOTE — Discharge Summary (Signed)
? ?Physician Discharge Summary  ?DOYNE MICKE IPJ:[id number] DOB: 1940/11/11 DOA: 06/13/2021 ? ?PCP: [name], MD ? ?Admit date: 06/13/2021 ?Discharge date: 06/16/2021 ?Admitted From: Home ?Disposition: Home ?Recommendations for Outpatient Follow-up:  ?Follow ups as below. ?ID to arrange outpatient follow-up. ?Please obtain CBC/BMP/Mag at follow up ?Please follow up on the following pending results: None ? ?Home Health: Not indicated ?Equipment/Devices: Not indicated ? ?Discharge Condition: Stable ?CODE STATUS: Full code ? Follow-up Information   ? ? [name], MD. Schedule an appointment as soon as possible for a visit in 1 week(s).   ?Specialty: Internal Medicine ?Contact information: ?[name] [address] ?[phone] ? ? ?  ?  ? ?  ?  ? ?  ? ? ?Hospital course ?81 year old M with PMH of prostate and bladder cancer s/p s/p robotic cystoprostatectomy and ileal conduit in 05/2016,  recurrent UTI with ESBL, asthma and depression presenting with body aches, generalized weakness and fever to 100.4, and admitted for complicated UTI.  He had mild leukocytosis with tachypnea.  Lactic acid within normal.  UA with moderate LE, positive nitrite and many bacteria.  CT abdomen and pelvis with stable finding consistent with prior transurethral resection of the urinary bladder and prior prostatectomy, subcentimeter nonobstructing right renal colliculi with stable bilateral hydronephrosis and hydroureter.  Urine and blood cultures obtained.  Started on meropenem ? ?Urine culture with GNR.  Sepsis physiology resolved.  Feels well.  Remains on IV meropenem pending urine culture speciation and sensitivity  ? ?See individual problem list below for more on hospital course. ? ?Problems addressed during this hospitalization ?Problem  ?Sepsis due to complicated UTI and patient with history of ESBL UTI  ?Complicated Uti (Urinary Tract Infection)  ?History of Bladder Cancer  ? 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  ? ?  ?History of Prostate Cancer  ? 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 ? ?  ?Asthma  ? Onset after diagnosis of Sarcoidosis @ 32 ? ?  ?Depression  ? Onset ? 06/01/1999; trigger was father's death ?Prozac ineffective ?Venlafaxine effective ?No FH depression ?Alcoholism MG uncle ? ?  ?  ?Assessment and Plan: ?* Sepsis due to complicated UTI and patient with history of ESBL UTI ?POA.  Presents with fever, generalized body pain and weakness.  Had leukocytosis and tachypnea on presentation.  UA consistent with UTI.   CT renal stone study without acute finding but chronic bilateral hydronephrosis and surgical change from cystectomy and prostatectomy.  Sepsis physiology resolved.  Feels well.  Blood cultures NGTD.  Urine culture with GNR. ?-Continue IV meropenem pending urine culture speciation and sensitivity ? ?Complicated UTI (urinary tract infection) ?Management as above. ? ?History of prostate cancer ?S/p [facility] in 06/[facility]. ? ?History of bladder cancer ?S/p robotic cystoprostatectomy and ileal conduit in 05/2016.  Followed at [facility].  CT renal stone study with stable bilateral hydronephrosis.  Clear urine in urostomy bag. ?-Outpatient follow-up ? ?Asthma ?Continue home inhalers. ? ?Depression ?Continue home Effexor. ? ? ? ? ?  ?  ?  ?  ? ?  ? ?Vital signs ?Vitals:  ? 06/15/21 2030 06/15/21 2211 06/16/21 0631 06/16/21 1349  ?BP:  119/62 117/83 123/86  ?Pulse:  81 73 79  ?Temp:  98.3 ?F (36.8 ?C) 97.9 ?F (36.6 ?C) 97.9 ?F (36.6 ?C)  ?Resp:  $'16 16 20  'P$ ?Height:      ?Weight:      ?SpO2: 95% 95% 94% 96%  ?TempSrc:    Oral  ?BMI (Calculated):      ?  ? ?Discharge exam ? ?GENERAL: No apparent distress.  Nontoxic. ?HEENT: MMM.  Vision and hearing grossly intact.  ?NECK: Supple.  No apparent  JVD.  ?RESP:  No IWOB.  Fair aeration bilaterally. ?CVS:  RRR. Heart sounds normal.  ?ABD/GI/GU: BS+. Abd soft, NTND.  Urostomy bag with clear looking urine.  No CVA tenderness. ?MSK/EXT:  Moves extremities. No apparent deformity. No edema.  ?SKIN: no apparent skin lesion or wound ?NEURO: Awake and alert. Oriented appropriately.  No apparent focal neuro deficit. ?PSYCH: Calm. Normal affect.  ? ?Discharge Instructions ?Discharge Instructions   ? ? Diet - low sodium heart healthy   Complete by: As directed ?  ? Discharge instructions   Complete by: As directed ?  ? It has been a pleasure taking care of you! ? ?You were hospitalized due to urinary tract infection for which you have been treated with IV antibiotics.  You are discharged on fosfomycin per recommendation by infectious disease.  You have already received the first dose.  You will take your second dose in 3 days to complete treatment course.  You may resume your methanamine in 5 days. ? ?Infectious disease will arrange outpatient follow-up.  You may follow-up with urologist as well. ? ? ?Take care,  ? Increase activity slowly   Complete by: As directed ?  ? ?  ? ?Allergies as of 06/16/2021   ? ?   Reactions  ? Ivp Dye [iodinated Contrast Media] Shortness Of Breath, Itching, Palpitations  ? "eyes itching,  Heart racing,  Effected breathing" ?10-27-16 pt with 13 hr pre-meds for CT without any reaction-kj  ? Metrizamide Shortness Of Breath, Itching, Palpitations  ? "eyes itching,  Heart racing,  Effected breathing" ?10-27-16 pt with 13 hr pre-meds for CT without any reaction-kj  ? Shellfish Allergy Hives, Shortness Of Breath, Itching  ? Mostly crab  ? Gabapentin Other (See Comments)  ? REACTION: dizziness and flushing  ? Niacin-lovastatin Er Other (See Comments)  ? Did not feel good on medication  ? Gadolinium Hives  ? Adhesive [tape] Rash  ? Use paper tape  ? ?  ? ?  ?Medication List  ?  ? ?TAKE these medications   ? ?acetaminophen 500 MG tablet ?Commonly known  as: TYLENOL ?Take 1,000 mg by mouth every 6 (six) hours as needed for headache or fever (pain). ?  ?Advair Diskus 250-50 MCG/ACT Aepb ?Generic drug: fluticasone-salmeterol ?Inhale 1 puff into the lungs 2 (two) times daily. ?  ?albuterol 108 (90 Base) MCG/ACT inhaler ?Commonly known as: VENTOLIN HFA ?Inhale 2 puffs into the lungs every 6 (six) hours as needed for wheezing or shortness of breath. ?  ?fosfomycin 3 g Pack ?Commonly known as: MONUROL ?Take 3 g by mouth once for 1 dose. ?Start taking on: June 19, 2021 ?  ?GLUCOSAMINE CHOND DOUBLE STR PO ?Take 1 tablet by mouth 2 (two) times daily. ?  ?latanoprost 0.005 % ophthalmic solution ?Commonly known as: XALATAN ?Place 1 drop into the right eye at bedtime. ?  ?magnesium oxide 400 MG tablet ?Commonly known as: MAG-OX ?Take 400 mg by mouth daily with supper. ?  ?methenamine 1 g tablet ?Commonly known as: HIPREX ?Take 1 tablet (1 g total) by mouth 2 (two) times daily with a meal. ?Start taking on: June 21, 2021 ?What changed: These instructions start on June 21, 2021. If you are unsure what to do until then, ask your doctor or other care provider. ?  ?montelukast 10 MG tablet ?Commonly known as: SINGULAIR ?Take 1 tablet (10 mg total) by mouth at bedtime. ?  ?triamcinolone 55 MCG/ACT Aero nasal inhaler ?Commonly known as: NASACORT ?Place 1 spray into the nose at bedtime. ?  ?venlafaxine 75 MG tablet ?Commonly known as: EFFEXOR ?Take 1.5 tablets (112.5 mg total) by mouth daily. ?What changed: when to take this ?  ? ?  ? ? ?Consultations: ?Infectious disease ? ?Procedures/Studies: ? ? ?DG Chest 2 View ? ?Result Date: 06/14/2021 ?CLINICAL DATA:  Sepsis EXAM: CHEST - 2 VIEW COMPARISON:  01/10/2021 FINDINGS: Mild right basilar atelectasis. Lungs are otherwise clear. No pneumothorax or pleural effusion. Cardiac size within normal limits. Pulmonary vascularity is normal. Left total shoulder arthroplasty has been performed. IMPRESSION: No active cardiopulmonary disease.  Electronically Signed   By: Fidela Salisbury M.D.   On: 06/14/2021 00:18  ? ?CT Renal Stone Study ? ?Result Date: 06/14/2021 ?CLINICAL DATA:  Flank pain. EXAM: CT ABDOMEN AND PELVIS WITHOUT CONTRAST TECHNIQUE: Multidetector

## 2021-06-16 NOTE — TOC Benefit Eligibility Note (Signed)
Patient Advocate Encounter ?  ?Received notification fthat prior authorization for Fosfomycin Tromethamine 3GM packets is required. ?  ?PA submitted on 06/16/2021 ?Key [id number] ?Status is pending ?   ? ? ? ?Lyndel Safe, CPhT ?Pharmacy Patient Advocate Specialist ?[place] Patient Advocate Team ?Direct Number: [phone]  Fax: [phone]  ?

## 2021-06-16 NOTE — TOC Benefit Eligibility Note (Signed)
Patient Advocate Encounter ? ?Insurance verification completed.   ? ?The patient is currently admitted and upon discharge could be taking fosfomycin (Monurol) 3 g pack. ? ?Product Not on Formulary ? ?The patient is insured through [facility] Part D  ? ? ? ?Lyndel Safe, CPhT ?Pharmacy Patient Advocate Specialist ?[place] Patient Advocate Team ?Direct Number: [phone]  Fax: [phone] ? ? ? ? ? ?  ?

## 2021-06-16 NOTE — Evaluation (Signed)
Occupational Therapy Evaluation ?Patient Details ?Name: Adrian Rios ?MRN: [id number] ?DOB: 08/10/1940 ?Today's Date: 06/16/2021 ? ? ?History of Present Illness Patient is a 81 year old male who presented to the hosptial with body aches, fever and general weakness. patient was found to have sepsis with complicated UTI. PMH: ESBL UTI, asthma, depression amd bladder CA  ? ?Clinical Impression ?  ?Patient evaluated by Occupational Therapy with no further acute OT needs identified. All education has been completed and the patient has no further questions. Patient endorses being at baseline of MI for ADLs. Patient was educated on ECT.  See below for any follow-up Occupational Therapy or equipment needs. OT is signing off. Thank you for this referral. ?  ?   ? ?Recommendations for follow up therapy are one component of a multi-disciplinary discharge planning process, led by the attending physician.  Recommendations may be updated based on patient status, additional functional criteria and insurance authorization.  ? ?Follow Up Recommendations ? No OT follow up  ?  ?Assistance Recommended at Discharge PRN  ?Patient can return home with the following Assistance with cooking/housework ? ?  ?Functional Status Assessment ? Patient has not had a recent decline in their functional status  ?Equipment Recommendations ? None recommended by OT  ?  ?Recommendations for Other Services   ? ? ?  ?Precautions / Restrictions Restrictions ?Weight Bearing Restrictions: No  ? ?  ? ?Mobility Bed Mobility ?Overal bed mobility: Modified Independent ?  ?  ?  ?  ?  ?  ?  ?  ? ?Transfers ?  ?  ?  ?  ?  ?  ?  ?  ?  ?  ?  ? ?  ?Balance Overall balance assessment: Modified Independent ?  ?  ?  ?  ?  ?  ?  ?  ?  ?  ?  ?  ?  ?  ?  ?  ?  ?  ?   ? ?ADL either performed or assessed with clinical judgement  ? ?ADL Overall ADL's : Modified independent ?  ?  ?  ?  ?  ?  ?  ?  ?  ?  ?  ?  ?  ?  ?  ?  ?  ?  ?  ?General ADL Comments: patient is at prior level for  ADLs at this time. patient was able to complete lB dressing tasks, standing balance challanages, functional mobility in and out of room with no AD with MI on this date. patient was educated on ECT. patient verbalzied understanding. patient endorses being at baseline  ? ? ? ?Vision Baseline Vision/History: 1 Wears glasses ?Patient Visual Report: No change from baseline ?   ?   ?Perception   ?  ?Praxis   ?  ? ?Pertinent Vitals/Pain Pain Assessment ?Pain Assessment: No/denies pain  ? ? ? ?Hand Dominance Right ?  ?Extremity/Trunk Assessment Upper Extremity Assessment ?Upper Extremity Assessment: Overall WFL for tasks assessed ?  ?Lower Extremity Assessment ?Lower Extremity Assessment: Defer to PT evaluation ?  ?Cervical / Trunk Assessment ?Cervical / Trunk Assessment: Normal ?  ?Communication Communication ?Communication: No difficulties ?  ?Cognition Arousal/Alertness: Awake/alert ?Behavior During Therapy: [facility] for tasks assessed/performed ?Overall Cognitive Status: Within Functional Limits for tasks assessed ?  ?  ?  ?  ?  ?  ?  ?  ?  ?  ?  ?  ?  ?  ?  ?  ?  ?  ?  ?  General Comments    ? ?  ?Exercises   ?  ?Shoulder Instructions    ? ? ?Home Living Family/patient expects to be discharged to:: Private residence ?Living Arrangements: Spouse/significant other ?Available Help at Discharge: Family ?Type of Home: House ?Home Access: Ramped entrance ?  ?  ?Home Layout: Two level;Able to live on main level with bedroom/bathroom ?  ?  ?Bathroom Shower/Tub: Gaffer;Tub/shower unit ?  ?Bathroom Toilet: Standard ?  ?  ?Home Equipment: Grab bars - tub/shower ?  ?  ?  ? ?  ?Prior Functioning/Environment Prior Level of Function : Independent/Modified Independent ?  ?  ?  ?  ?  ?  ?  ?  ?  ? ?  ?  ?OT Problem List:   ?  ?   ?OT Treatment/Interventions:    ?  ?OT Goals(Current goals can be found in the care plan section) Acute Rehab OT Goals ?OT Goal Formulation: All assessment and education complete, DC therapy  ?OT Frequency:    ?  ? ?Co-evaluation   ?  ?  ?  ?  ? ?  ?AM-PAC OT "6 Clicks" Daily Activity     ?Outcome Measure Help from another person eating meals?: None ?Help from another person taking care of personal grooming?: None ?Help from another person toileting, which includes using toliet, bedpan, or urinal?: None ?Help from another person bathing (including washing, rinsing, drying)?: None ?Help from another person to put on and taking off regular upper body clothing?: None ?Help from another person to put on and taking off regular lower body clothing?: None ?6 Click Score: 24 ?  ?End of Session Nurse Communication: Other (comment) (ok to participate in session) ? ?Activity Tolerance: Patient tolerated treatment well ?Patient left: in bed;with call bell/phone within reach ? ?OT Visit Diagnosis: Unsteadiness on feet (R26.81)  ?              ?Time: 1050-1108 ?OT Time Calculation (min): 18 min ?Charges:  OT General Charges ?$OT Visit: 1 Visit ?OT Evaluation ?$OT Eval Low Complexity: 1 Low ? ?Tabithia Stroder OTR/L, MS ?Acute Rehabilitation Department ?Office# [phone] ?Pager# [phone] ? ? ?[place] ?06/16/2021, 12:09 PM ?

## 2021-06-19 LAB — CULTURE, BLOOD (ROUTINE X 2)
Culture: NO GROWTH
Culture: NO GROWTH
Special Requests: ADEQUATE
Special Requests: ADEQUATE

## 2021-06-24 ENCOUNTER — Encounter: Payer: Self-pay | Admitting: Internal Medicine

## 2021-06-24 NOTE — Progress Notes (Signed)
? ? ? ? ?Subjective:  ? ? Patient ID: Adrian Rios, male    DOB: [birth year], 81 y.o.   MRN: [id number] ? ?This visit occurred during the SARS-CoV-2 public health emergency.  Safety protocols were in place, including screening questions prior to the visit, additional usage of staff PPE, and extensive cleaning of exam room while observing appropriate contact time as indicated for disinfecting solutions.   ? ? ?HPI ?Adrian Rios is here for follow up from the hospital.  He is here with his wife. ? ? ?Admitted 4/14-4/17 for urosepsis ? ?Outpatient follow-up:  ?Follow-up with infectious disease at [facility] ?Obtain CBC, BMP, mag ? ?He has a history of prostate and bladder cancer status post robotic cystoprostatectomy and ileal conduit in 05/2016, recurrent UTI with ESBL who presents with body aches, generalized weakness and fever of 100.4.  He was admitted for complicated UTI.  Had leukocytosis and tachypnea.  UA with moderate leuk esterase, positive nitrite and many bacteria.  CT abdomen pelvis with stable finding consistent with prior transurethral resection of the urinary bladder and prior prostatectomy, subcentimeter nonobstructing right renal calculi with stable bilateral hydronephrosis and hydroureter.  Started on meropenem.  Urine culture with gram-negative rods.  Sepsis resolved.  Remained on IV Pentam until urine cultures returned.  Urine culture showed more than 100,000 E. coli, 30,000 Enterococcus faecalis.  Blood cultures were negative ? ?He was discharged with fosfomycin 3 g packet x1-advised to take in 4/20.  His methenamine was held and he was advised to start this on 4/22. ? ?He is feeling better.  His energy level is continuing to improve, but is variable depending on the day.  He denies any fevers.  His appetite is good.  He has no other concerning symptoms.  His biggest concern is how to prevent these recurring infections. ? ?He would like to see an infectious disease doctor at [facility] that specializes  in this. ? ?He is taking all of his medications as prescribed.   ? ? ?Medications and allergies reviewed with patient and updated if appropriate. ? ?Current Outpatient Medications on File Prior to Visit  ?Medication Sig Dispense Refill  ? acetaminophen (TYLENOL) 500 MG tablet Take 1,000 mg by mouth every 6 (six) hours as needed for headache or fever (pain).    ? ADVAIR DISKUS 250-50 MCG/ACT AEPB Inhale 1 puff into the lungs 2 (two) times daily.    ? albuterol (VENTOLIN HFA) 108 (90 Base) MCG/ACT inhaler Inhale 2 puffs into the lungs every 6 (six) hours as needed for wheezing or shortness of breath. 1 each 8  ? latanoprost (XALATAN) 0.005 % ophthalmic solution Place 1 drop into the right eye at bedtime.    ? magnesium oxide (MAG-OX) 400 MG tablet Take 400 mg by mouth daily with supper.    ? methenamine (HIPREX) 1 g tablet Take 1 tablet (1 g total) by mouth 2 (two) times daily with a meal.    ? Misc Natural Products (GLUCOSAMINE CHOND DOUBLE STR PO) Take 1 tablet by mouth 2 (two) times daily.    ? montelukast (SINGULAIR) 10 MG tablet Take 1 tablet (10 mg total) by mouth at bedtime. 90 tablet 2  ? triamcinolone (NASACORT) 55 MCG/ACT AERO nasal inhaler Place 1 spray into the nose at bedtime.    ? venlafaxine (EFFEXOR) 75 MG tablet Take 1.5 tablets (112.5 mg total) by mouth daily. (Patient taking differently: Take 112.5 mg by mouth every morning.) 135 tablet 1  ? ?No current facility-administered medications  on file prior to visit.  ? ? ? ?Review of Systems  ?Constitutional:  Positive for fatigue. Negative for appetite change and fever.  ?Respiratory:  Negative for shortness of breath.   ?Cardiovascular:  Negative for chest pain.  ?Gastrointestinal:  Negative for abdominal pain and nausea.  ?Neurological:  Negative for light-headedness and headaches.  ? ?   ?Objective:  ? ?Vitals:  ? 06/25/21 1304  ?BP: 108/72  ?Pulse: 89  ?Temp: 98.1 ?F (36.7 ?C)  ?SpO2: 96%  ? ?BP Readings from Last 3 Encounters:  ?06/25/21 108/72   ?06/16/21 123/86  ?05/23/21 114/73  ? ?Wt Readings from Last 3 Encounters:  ?06/25/21 172 lb (78 kg)  ?06/14/21 172 lb 2.9 oz (78.1 kg)  ?05/23/21 170 lb (77.1 kg)  ? ?Body mass index is 26.94 kg/m?. ? ?  ?Physical Exam ?Constitutional:   ?   General: He is not in acute distress. ?   Appearance: Normal appearance. He is not ill-appearing.  ?HENT:  ?   Head: Normocephalic and atraumatic.  ?Eyes:  ?   Conjunctiva/sclera: Conjunctivae normal.  ?Cardiovascular:  ?   Rate and Rhythm: Normal rate and regular rhythm.  ?   Heart sounds: Normal heart sounds. No murmur heard. ?Pulmonary:  ?   Effort: Pulmonary effort is normal. No respiratory distress.  ?   Breath sounds: Normal breath sounds. No wheezing or rales.  ?Musculoskeletal:  ?   Right lower leg: No edema.  ?   Left lower leg: No edema.  ?Skin: ?   General: Skin is warm and dry.  ?   Findings: No rash.  ?Neurological:  ?   Mental Status: He is alert. Mental status is at baseline.  ?Psychiatric:     ?   Mood and Affect: Mood normal.  ? ?   ? ?Lab Results  ?Component Value Date  ? WBC 8.0 06/16/2021  ? HGB 11.7 (L) 06/16/2021  ? HCT 36.2 (L) 06/16/2021  ? PLT 327 06/16/2021  ? GLUCOSE 109 (H) 06/16/2021  ? CHOL 146 08/02/2020  ? TRIG 130.0 08/02/2020  ? HDL 41.50 08/02/2020  ? [place] 79 08/02/2020  ? ALT 14 06/15/2021  ? AST 15 06/15/2021  ? NA 143 06/16/2021  ? K 3.8 06/16/2021  ? CL 111 06/16/2021  ? CREATININE 0.80 06/16/2021  ? BUN 17 06/16/2021  ? CO2 25 06/16/2021  ? TSH 1.17 10/28/2018  ? PSA 0.29 07/11/2009  ? INR 1.1 06/13/2021  ? HGBA1C 5.9 08/02/2020  ? MICROALBUR 0.3 06/29/2006  ? ? ? ?Assessment & Plan:  ? ? ?See Problem List for Assessment and Plan of chronic medical problems.  ? ? ?

## 2021-06-24 NOTE — Patient Instructions (Addendum)
? ? ? ?  Blood work was ordered.   ? ? ?Medications changes include :  none  ? ? ?A referral was ordered for Dr Tye Savoy.   ? ?

## 2021-06-25 ENCOUNTER — Ambulatory Visit (INDEPENDENT_AMBULATORY_CARE_PROVIDER_SITE_OTHER): Payer: Medicare Other | Admitting: Internal Medicine

## 2021-06-25 VITALS — BP 108/72 | HR 89 | Temp 98.1°F | Ht 67.0 in | Wt 172.0 lb

## 2021-06-25 DIAGNOSIS — N390 Urinary tract infection, site not specified: Secondary | ICD-10-CM

## 2021-06-25 DIAGNOSIS — R7303 Prediabetes: Secondary | ICD-10-CM

## 2021-06-25 DIAGNOSIS — N183 Chronic kidney disease, stage 3 unspecified: Secondary | ICD-10-CM

## 2021-06-25 DIAGNOSIS — F3289 Other specified depressive episodes: Secondary | ICD-10-CM

## 2021-06-25 LAB — HEMOGLOBIN A1C: Hgb A1c MFr Bld: 5.7 % (ref 4.6–6.5)

## 2021-06-25 LAB — BASIC METABOLIC PANEL
BUN: 23 mg/dL (ref 6–23)
CO2: 25 mEq/L (ref 19–32)
Calcium: 9.3 mg/dL (ref 8.4–10.5)
Chloride: 106 mEq/L (ref 96–112)
Creatinine, Ser: 1.09 mg/dL (ref 0.40–1.50)
GFR: 63.89 mL/min (ref >60.00)
Glucose, Bld: 129 mg/dL — ABNORMAL HIGH (ref 70–99)
Potassium: 4.1 mEq/L (ref 3.5–5.1)
Sodium: 139 mEq/L (ref 135–145)

## 2021-06-25 LAB — CBC WITH DIFFERENTIAL/PLATELET
Basophils Absolute: 0.1 10*3/uL (ref 0.0–0.1)
Basophils Relative: 0.7 % (ref 0.0–3.0)
Eosinophils Absolute: 0.3 10*3/uL (ref 0.0–0.7)
Eosinophils Relative: 3.6 % (ref 0.0–5.0)
HCT: 37.7 % — ABNORMAL LOW (ref 39.0–52.0)
Hemoglobin: 12.6 g/dL — ABNORMAL LOW (ref 13.0–17.0)
Lymphocytes Relative: 27.9 % (ref 12.0–46.0)
Lymphs Abs: 2.2 10*3/uL (ref 0.7–4.0)
MCHC: 33.3 g/dL (ref 30.0–36.0)
MCV: 92.2 fl (ref 78.0–100.0)
Monocytes Absolute: 0.5 10*3/uL (ref 0.1–1.0)
Monocytes Relative: 6.8 % (ref 3.0–12.0)
Neutro Abs: 4.8 10*3/uL (ref 1.4–7.7)
Neutrophils Relative %: 61 % (ref 43.0–77.0)
Platelets: 427 10*3/uL — ABNORMAL HIGH (ref 150.0–400.0)
RBC: 4.09 Mil/uL — ABNORMAL LOW (ref 4.22–5.81)
RDW: 13.6 % (ref 11.5–15.5)
WBC: 7.8 10*3/uL (ref 4.0–10.5)

## 2021-06-25 LAB — MAGNESIUM: Magnesium: 2.1 mg/dL (ref 1.5–2.5)

## 2021-06-25 NOTE — Assessment & Plan Note (Addendum)
Recent complicated UTI with urosepsis ?Recurrent UTI with sepsis ?Treated with IV meropenem in the hospital, discharged with dose of fosfomycin ?No clinical symptoms of urinary tract infection ?Interested in seeing Dr. Renelda Mom infectious disease at [facility] ordered ?Check CBC, BMP ?

## 2021-06-25 NOTE — Assessment & Plan Note (Signed)
Chronic ?BMP ?

## 2021-06-25 NOTE — Assessment & Plan Note (Signed)
Chronic ?Controlled, Stable ?Continue venlafaxine 112.5 mg daily ?

## 2021-06-25 NOTE — Assessment & Plan Note (Signed)
Chronic ?Has had recurrent complicated urinary tract infections with sepsis ?Recently discharged ?Has had more than 1 type of bacteria and different bacteria's with different infections ?Has seen infectious disease, but infections recur ?Referral ordered for Dr. Renelda Mom [facility] infectious disease for further evaluation and help with prevention ?

## 2021-06-25 NOTE — Assessment & Plan Note (Signed)
Chronic ?Taking magnesium ?Check magnesium level ?

## 2021-06-25 NOTE — Assessment & Plan Note (Signed)
Chronic Check A1c 

## 2021-06-30 ENCOUNTER — Other Ambulatory Visit: Payer: Self-pay | Admitting: Internal Medicine

## 2021-07-08 DIAGNOSIS — N390 Urinary tract infection, site not specified: Secondary | ICD-10-CM | POA: Diagnosis not present

## 2021-07-08 DIAGNOSIS — Z79899 Other long term (current) drug therapy: Secondary | ICD-10-CM | POA: Diagnosis not present

## 2021-07-08 DIAGNOSIS — Z9889 Other specified postprocedural states: Secondary | ICD-10-CM | POA: Diagnosis not present

## 2021-07-08 DIAGNOSIS — Z8619 Personal history of other infectious and parasitic diseases: Secondary | ICD-10-CM | POA: Diagnosis not present

## 2021-07-08 DIAGNOSIS — Z87440 Personal history of urinary (tract) infections: Secondary | ICD-10-CM | POA: Diagnosis not present

## 2021-07-08 DIAGNOSIS — Z888 Allergy status to other drugs, medicaments and biological substances status: Secondary | ICD-10-CM | POA: Diagnosis not present

## 2021-07-14 ENCOUNTER — Other Ambulatory Visit: Payer: Self-pay | Admitting: Internal Medicine

## 2021-07-23 ENCOUNTER — Emergency Department (HOSPITAL_BASED_OUTPATIENT_CLINIC_OR_DEPARTMENT_OTHER)
Admission: EM | Admit: 2021-07-23 | Discharge: 2021-07-23 | Disposition: A | Discharge status: Home / Self Care | Payer: Medicare Other | Attending: Emergency Medicine | Admitting: Emergency Medicine

## 2021-07-23 ENCOUNTER — Other Ambulatory Visit: Payer: Self-pay

## 2021-07-23 ENCOUNTER — Other Ambulatory Visit (INDEPENDENT_AMBULATORY_CARE_PROVIDER_SITE_OTHER): Payer: Medicare Other

## 2021-07-23 ENCOUNTER — Emergency Department (HOSPITAL_BASED_OUTPATIENT_CLINIC_OR_DEPARTMENT_OTHER): Payer: Medicare Other

## 2021-07-23 ENCOUNTER — Encounter (HOSPITAL_BASED_OUTPATIENT_CLINIC_OR_DEPARTMENT_OTHER): Payer: Self-pay | Admitting: Emergency Medicine

## 2021-07-23 DIAGNOSIS — R300 Dysuria: Secondary | ICD-10-CM | POA: Diagnosis not present

## 2021-07-23 DIAGNOSIS — R1031 Right lower quadrant pain: Secondary | ICD-10-CM | POA: Diagnosis present

## 2021-07-23 DIAGNOSIS — N3090 Cystitis, unspecified without hematuria: Secondary | ICD-10-CM | POA: Diagnosis not present

## 2021-07-23 DIAGNOSIS — N1330 Unspecified hydronephrosis: Secondary | ICD-10-CM | POA: Diagnosis not present

## 2021-07-23 DIAGNOSIS — N200 Calculus of kidney: Secondary | ICD-10-CM | POA: Diagnosis not present

## 2021-07-23 DIAGNOSIS — N390 Urinary tract infection, site not specified: Secondary | ICD-10-CM | POA: Diagnosis not present

## 2021-07-23 DIAGNOSIS — D72829 Elevated white blood cell count, unspecified: Secondary | ICD-10-CM | POA: Insufficient documentation

## 2021-07-23 DIAGNOSIS — N281 Cyst of kidney, acquired: Secondary | ICD-10-CM | POA: Diagnosis not present

## 2021-07-23 DIAGNOSIS — N261 Atrophy of kidney (terminal): Secondary | ICD-10-CM | POA: Diagnosis not present

## 2021-07-23 LAB — CBC WITH DIFFERENTIAL/PLATELET
Abs Immature Granulocytes: 0.04 10*3/uL (ref 0.00–0.07)
Basophils Absolute: 0.1 10*3/uL (ref 0.0–0.1)
Basophils Relative: 1 %
Eosinophils Absolute: 0.2 10*3/uL (ref 0.0–0.5)
Eosinophils Relative: 2 %
HCT: 38.5 % — ABNORMAL LOW (ref 39.0–52.0)
Hemoglobin: 12.9 g/dL — ABNORMAL LOW (ref 13.0–17.0)
Immature Granulocytes: 0 %
Lymphocytes Relative: 18 %
Lymphs Abs: 2.1 10*3/uL (ref 0.7–4.0)
MCH: 30.7 pg (ref 26.0–34.0)
MCHC: 33.5 g/dL (ref 30.0–36.0)
MCV: 91.7 fL (ref 80.0–100.0)
Monocytes Absolute: 0.8 10*3/uL (ref 0.1–1.0)
Monocytes Relative: 7 %
Neutro Abs: 8.8 10*3/uL — ABNORMAL HIGH (ref 1.7–7.7)
Neutrophils Relative %: 72 %
Platelets: 328 10*3/uL (ref 150–400)
RBC: 4.2 MIL/uL — ABNORMAL LOW (ref 4.22–5.81)
RDW: 12.7 % (ref 11.5–15.5)
WBC: 12 10*3/uL — ABNORMAL HIGH (ref 4.0–10.5)
nRBC: 0 % (ref 0.0–0.2)

## 2021-07-23 LAB — URINALYSIS, ROUTINE W REFLEX MICROSCOPIC
Bilirubin Urine: NEGATIVE
Ketones, ur: NEGATIVE
Nitrite: POSITIVE — AB
Specific Gravity, Urine: 1.01 (ref 1.000–1.030)
Total Protein, Urine: NEGATIVE
Urine Glucose: NEGATIVE
Urobilinogen, UA: 0.2 (ref 0.0–1.0)
pH: 6 (ref 5.0–8.0)

## 2021-07-23 LAB — COMPREHENSIVE METABOLIC PANEL
ALT: 15 U/L (ref 0–44)
AST: 21 U/L (ref 15–41)
Albumin: 3.6 g/dL (ref 3.5–5.0)
Alkaline Phosphatase: 91 U/L (ref 38–126)
Anion gap: 10 (ref 5–15)
BUN: 18 mg/dL (ref 8–23)
CO2: 21 mmol/L — ABNORMAL LOW (ref 22–32)
Calcium: 8.7 mg/dL — ABNORMAL LOW (ref 8.9–10.3)
Chloride: 106 mmol/L (ref 98–111)
Creatinine, Ser: 1.31 mg/dL — ABNORMAL HIGH (ref 0.61–1.24)
GFR, Estimated: 55 mL/min — ABNORMAL LOW (ref >60)
Glucose, Bld: 137 mg/dL — ABNORMAL HIGH (ref 70–99)
Potassium: 4 mmol/L (ref 3.5–5.1)
Sodium: 137 mmol/L (ref 135–145)
Total Bilirubin: 0.4 mg/dL (ref 0.3–1.2)
Total Protein: 7.5 g/dL (ref 6.5–8.1)

## 2021-07-23 LAB — LACTIC ACID, PLASMA: Lactic Acid, Venous: 1.1 mmol/L (ref 0.5–1.9)

## 2021-07-23 MED ORDER — SODIUM CHLORIDE 0.9 % IV SOLN
1.0000 g | Freq: Once | INTRAVENOUS | Status: AC
  Administered 2021-07-23: 1 g via INTRAVENOUS

## 2021-07-23 MED ORDER — SODIUM CHLORIDE 0.9 % IV SOLN: 1.0000 g | Freq: Two times a day (BID) | INTRAVENOUS | Status: DC

## 2021-07-23 MED ORDER — SODIUM CHLORIDE 0.9 % IV BOLUS
1000.0000 mL | Freq: Once | INTRAVENOUS | Status: AC
  Administered 2021-07-23: 1000 mL via INTRAVENOUS

## 2021-07-23 MED ORDER — FOSFOMYCIN TROMETHAMINE 3 G PO PACK: 3.0000 g | PACK | ORAL | 0 refills | Status: DC

## 2021-07-23 NOTE — ED Triage Notes (Signed)
Pt has been feeling general malaise today; hx frequent UTIs; saw PCP today and urine did show infection; was referred here

## 2021-07-23 NOTE — Discharge Instructions (Signed)
You have a urinary tract infection  We sent off a urine culture and blood culture and you will be called if you have another resistant organism  Please take fosfomycin on 5/26 and 5/28   See your doctor for follow-up.    You need to return to the ER immediately if you have fever and severe pain and vomiting

## 2021-07-23 NOTE — Progress Notes (Signed)
Pharmacy Antibiotic Note  Adrian Rios is a 81 y.o. male admitted on 07/23/2021 with ESBL UTI.  Pharmacy has been consulted for meropenem dosing.  Patient has history of frequent UTIs and saw PCP today. Urine showed infection per chart review. UA nitrite positive, 21-50 leukocytes, many bacteria, few squamous cells.   Patient has history of ESBL E. Coli UTIs per chart review.   Baseline Scr 0.8-1.0, afebrile  Plan: Meropenem 1g q12h F/u cultures, ability to de-escalate, renal function  Height: '5\' 7"'$  (170.2 cm) Weight: 78 kg (172 lb) IBW/kg (Calculated) : 66.1  Temp (24hrs), Avg:98.7 F (37.1 C), Min:98.7 F (37.1 C), Max:98.7 F (37.1 C)  No results for input(s): WBC, CREATININE, LATICACIDVEN, VANCOTROUGH, VANCOPEAK, VANCORANDOM, GENTTROUGH, GENTPEAK, GENTRANDOM, TOBRATROUGH, TOBRAPEAK, TOBRARND, AMIKACINPEAK, AMIKACINTROU, AMIKACIN in the last 168 hours.  CrCl cannot be calculated (Patient's most recent lab result is older than the maximum 21 days allowed.).    Allergies  Allergen Reactions   Ivp Dye [Iodinated Contrast Media] Shortness Of Breath, Itching and Palpitations    "eyes itching,  Heart racing,  Effected breathing" 10-27-16 pt with 13 hr pre-meds for CT without any reaction-kj   Metrizamide Shortness Of Breath, Itching and Palpitations     "eyes itching,  Heart racing,  Effected breathing" 10-27-16 pt with 13 hr pre-meds for CT without any reaction-kj   Shellfish Allergy Hives, Shortness Of Breath and Itching    Mostly crab   Gabapentin Other (See Comments)    REACTION: dizziness and flushing   Niacin-Lovastatin Er Other (See Comments)    Did not feel good on medication   Gadolinium Hives   Adhesive [Tape] Rash    Use paper tape    Antimicrobials this admission: 5/24 meropenem >>   Dose adjustments this admission: N/A  Microbiology results: 5/24 BCx: sent 5/24 UCx: (from outpatient PCP)   Thank you for allowing pharmacy to be a part of this patient's  care.  Adrian Rios Adrian Rios 07/23/2021 7:12 PM

## 2021-07-23 NOTE — ED Provider Notes (Signed)
[facility] Provider Note   CSN: [id number] Arrival date & time: 07/23/21  1802     History  Chief Complaint  Patient presents with   Urinary Tract Infection    Adrian Rios is a 81 y.o. male history of ESBL UTI, here presenting with possible UTI.  Patient has some malaise and diffuse weakness and right flank pain for several days.  He went to PCP today and had a urinalysis that showed UTI.  Patient was admitted for ESBL UTI about a month ago.  Patient finished several days of meropenem and was discharged home with fosfomycin.  Patient denies any fever this time.  The history is provided by the patient.      Home Medications Prior to Admission medications   Medication Sig Start Date End Date Taking? Authorizing Provider  acetaminophen (TYLENOL) 500 MG tablet Take 1,000 mg by mouth every 6 (six) hours as needed for headache or fever (pain).    [provider]  ADVAIR DISKUS 250-50 MCG/ACT AEPB INHALE 1 PUFF INTO THE LUNGS EVERY 12 HOURS 07/01/21   Burns, Claudina Lick, MD  albuterol (VENTOLIN HFA) 108 (90 Base) MCG/ACT inhaler Inhale 2 puffs into the lungs every 6 (six) hours as needed for wheezing or shortness of breath. 02/01/20   Burns, Claudina Lick, MD  latanoprost (XALATAN) 0.005 % ophthalmic solution Place 1 drop into the right eye at bedtime. 05/06/20   [provider]  magnesium oxide (MAG-OX) 400 MG tablet Take 400 mg by mouth daily with supper.    [provider]  methenamine (HIPREX) 1 g tablet Take 1 tablet (1 g total) by mouth 2 (two) times daily with a meal. 06/21/21   Gonfa, Charlesetta Ivory, MD  Misc Natural Products (GLUCOSAMINE CHOND DOUBLE STR PO) Take 1 tablet by mouth 2 (two) times daily.    [provider]  montelukast (SINGULAIR) 10 MG tablet Take 1 tablet (10 mg total) by mouth at bedtime. 01/27/21   [name], MD  triamcinolone (NASACORT) 55 MCG/ACT AERO nasal inhaler Place 1 spray into the nose at bedtime.     [provider]  venlafaxine (EFFEXOR) 75 MG tablet TAKE 1.5 TABLETS BY MOUTH DAILY 07/17/21   [name], MD      Allergies    Ivp dye [iodinated contrast media], Metrizamide, Shellfish allergy, Gabapentin, Niacin-lovastatin er, Gadolinium, and Adhesive [tape]    Review of Systems   Review of Systems  Gastrointestinal:        Right flank pain  All other systems reviewed and are negative.  Physical Exam Updated Vital Signs BP 112/73 (BP Location: Right Arm)   Pulse 95   Temp 98.7 F (37.1 C) (Oral)   Resp 20   Ht '5\' 7"'$  (1.702 m)   Wt 78 kg   SpO2 96%   BMI 26.94 kg/m  Physical Exam Vitals and nursing note reviewed.  Constitutional:      Appearance: Normal appearance.     Comments: Chronically ill but not acutely ill  HENT:     Head: Normocephalic.     Nose: Nose normal.     Mouth/Throat:     Mouth: Mucous membranes are moist.  Eyes:     Extraocular Movements: Extraocular movements intact.     Pupils: Pupils are equal, round, and reactive to light.  Cardiovascular:     Rate and Rhythm: Normal rate and regular rhythm.     Pulses: Normal pulses.     Heart  sounds: Normal heart sounds.  Pulmonary:     Effort: Pulmonary effort is normal.     Breath sounds: Normal breath sounds.  Abdominal:     Comments: + R CVAT   Musculoskeletal:        General: Normal range of motion.     Cervical back: Normal range of motion and neck supple.  Skin:    General: Skin is warm.     Capillary Refill: Capillary refill takes less than 2 seconds.  Neurological:     General: No focal deficit present.     Mental Status: He is alert and oriented to person, place, and time.  Psychiatric:        Mood and Affect: Mood normal.        Behavior: Behavior normal.    ED Results / Procedures / Treatments   Labs (all labs ordered are listed, but only abnormal results are displayed) Labs Reviewed  COMPREHENSIVE METABOLIC PANEL - Abnormal; Notable for the following components:       Result Value   CO2 21 (*)    Glucose, Bld 137 (*)    Creatinine, Ser 1.31 (*)    Calcium 8.7 (*)    GFR, Estimated 55 (*)    All other components within normal limits  CBC WITH DIFFERENTIAL/PLATELET - Abnormal; Notable for the following components:   WBC 12.0 (*)    RBC 4.20 (*)    Hemoglobin 12.9 (*)    HCT 38.5 (*)    Neutro Abs 8.8 (*)    All other components within normal limits  CULTURE, BLOOD (ROUTINE X 2)  CULTURE, BLOOD (ROUTINE X 2)  LACTIC ACID, PLASMA  LACTIC ACID, PLASMA    EKG None  Radiology CT Renal Stone Study  Result Date: 07/23/2021 CLINICAL DATA:  Flank pain EXAM: CT ABDOMEN AND PELVIS WITHOUT CONTRAST TECHNIQUE: Multidetector CT imaging of the abdomen and pelvis was performed following the standard protocol without IV contrast. RADIATION DOSE REDUCTION: This exam was performed according to the departmental dose-optimization program which includes automated exposure control, adjustment of the mA and/or kV according to patient size and/or use of iterative reconstruction technique. COMPARISON:  CT 06/14/2021 trauma 10/27/2016, 03/16/2021, 01/05/2021 FINDINGS: Lower chest: Lung bases demonstrate no acute airspace disease. Hepatobiliary: No focal liver abnormality is seen. No gallstones, gallbladder wall thickening, or biliary dilatation. Pancreas: Unremarkable. No pancreatic ductal dilatation or surrounding inflammatory changes. Spleen: Normal in size without focal abnormality. Adrenals/Urinary Tract: Adrenal glands are normal. Mild bilateral cortical atrophy. Bilateral renal cysts, no follow-up imaging recommended. Moderate left and moderate severe right hydronephrosis and hydroureter without substantial change compared to most recent priors. Status post cystoprostatectomy with right lower quadrant ileal conduit. Intrarenal stone lower pole right kidney measuring 5 mm. Stomach/Bowel: The stomach is nonenlarged. No dilated small bowel. No acute bowel wall thickening.  Diverticular disease of the colon. History of appendectomy Vascular/Lymphatic: Moderate aortic atherosclerosis. No aneurysm. No suspicious lymph nodes Reproductive: Prostate is surgically absent Other: Negative for pelvic effusion or free air. Small fat containing left inguinal hernia Musculoskeletal: No acute or suspicious osseous findings IMPRESSION: 1. Overall stable appearance of the abdomen and pelvis since prior CT. Changes of prior cystoprostatectomy and right lower quadrant ileal conduit. Moderate severe right and moderate left hydronephrosis without substantial change as compared with the recent priors. There is mild bilateral renal cortical atrophy. Right kidney stone. 2. Diverticular disease of the colon without acute wall thickening. Electronically Signed   By: Madie Reno.D.  On: 07/23/2021 19:36    Procedures Procedures    Medications Ordered in ED Medications  meropenem (MERREM) 1 g in sodium chloride 0.9 % 100 mL IVPB (has no administration in time range)  sodium chloride 0.9 % bolus 1,000 mL (1,000 mLs Intravenous New Bag/Given 07/23/21 2039)  meropenem (MERREM) 1 g in sodium chloride 0.9 % 100 mL IVPB (1 g Intravenous New Bag/Given 07/23/21 2040)    ED Course/ Medical Decision Making/ A&P                           Medical Decision Making LATHAN GIESELMAN is a 81 y.o. male here presenting with right flank pain and malaise.  No fever at this time.  Patient was recently admitted for ESBL UTI.  I want to make sure he is not septic from it. Plan to get CBC and CMP and lactate and cultures and UA and urine culture.  We will also get CT renal stone.  We will give a dose of Meropenem and reassess  9:26 PM I reviewed patient's labs and independently interpreted patient's imaging.  White blood cell count is 12.  Patient's creatinine is 1.3.  I reviewed his urinalysis from the clinic and that showed UTI.  CT showed stable hydronephrosis with no obvious obstructing stone.  Patient was given  meropenem in the ED.  I discussed with patient regarding admission for IV antibiotics versus discharge on fosfomycin (hospitalist that that during last admission).  Given that his lactate is normal and he is not running a fever, patient wants to just go home at this point.  We will give 2 doses of fosfomycin  Problems Addressed: Urinary tract infection without hematuria, site unspecified: acute illness or injury  Amount and/or Complexity of Data Reviewed Labs: ordered. Decision-making details documented in ED Course. Radiology: ordered and independent interpretation performed. Decision-making details documented in ED Course.  Risk Prescription drug management.    Final Clinical Impression(s) / ED Diagnoses Final diagnoses:  None    Rx / DC Orders ED Discharge Orders     None         Drenda Freeze, MD 07/23/21 2128

## 2021-07-25 ENCOUNTER — Encounter: Payer: Self-pay | Admitting: Internal Medicine

## 2021-07-25 LAB — CULTURE, URINE COMPREHENSIVE

## 2021-07-28 LAB — CULTURE, BLOOD (ROUTINE X 2)
Culture: NO GROWTH
Culture: NO GROWTH
Special Requests: ADEQUATE
Special Requests: ADEQUATE

## 2021-07-29 ENCOUNTER — Telehealth: Payer: Self-pay | Admitting: *Deleted

## 2021-07-29 NOTE — Chronic Care Management (AMB) (Signed)
  Chronic Care Management Note  07/29/2021 Name: ERVEY FALLIN MRN: [id number] DOB: 1940-11-27  HULET EHRMANN is a 81 y.o. year old male who is a primary care patient of Quay Burow, Claudina Lick, MD and is actively engaged with the care management team. I reached out to Lelon Frohlich by phone today to assist with re-scheduling a follow up visit with the RN Case Manager  Follow up plan: Unsuccessful telephone outreach attempt made. A HIPAA compliant phone message was left for the patient providing contact information and requesting a return call.  The care management team will reach out to the patient again over the next 7 days.  If patient returns call to provider office, please advise to call [place] at [phone].  [place] Management  Direct Dial: [phone]

## 2021-08-05 ENCOUNTER — Ambulatory Visit: Payer: Medicare Other | Admitting: Internal Medicine

## 2021-08-05 DIAGNOSIS — L814 Other melanin hyperpigmentation: Secondary | ICD-10-CM | POA: Diagnosis not present

## 2021-08-05 DIAGNOSIS — L821 Other seborrheic keratosis: Secondary | ICD-10-CM | POA: Diagnosis not present

## 2021-08-05 DIAGNOSIS — Z85828 Personal history of other malignant neoplasm of skin: Secondary | ICD-10-CM | POA: Diagnosis not present

## 2021-08-05 DIAGNOSIS — L245 Irritant contact dermatitis due to other chemical products: Secondary | ICD-10-CM | POA: Diagnosis not present

## 2021-08-06 NOTE — Chronic Care Management (AMB) (Signed)
  Chronic Care Management Note  08/06/2021 Name: ALEXSANDRO SALEK MRN: [id number] DOB: January 04, 1941  DAMARIE SCHOOLFIELD is a 81 y.o. year old male who is a primary care patient of Quay Burow, Claudina Lick, MD and is actively engaged with the care management team. I reached out to Lelon Frohlich by phone today to assist with re-scheduling a follow up visit with the RN Case Manager  Follow up plan: Telephone appointment with care management team member scheduled for:08/28/21  [place] [place] [facility]  Direct Dial: [phone]

## 2021-08-07 ENCOUNTER — Telehealth: Payer: Medicare Other

## 2021-08-28 ENCOUNTER — Ambulatory Visit (INDEPENDENT_AMBULATORY_CARE_PROVIDER_SITE_OTHER): Payer: Medicare Other | Admitting: *Deleted

## 2021-08-28 DIAGNOSIS — N390 Urinary tract infection, site not specified: Secondary | ICD-10-CM

## 2021-08-28 DIAGNOSIS — J4520 Mild intermittent asthma, uncomplicated: Secondary | ICD-10-CM

## 2021-08-28 NOTE — Patient Instructions (Signed)
Visit [place], thank you for taking time to talk with me today. Please don't hesitate to contact me if I can be of assistance to you before our next scheduled telephone appointment  As we discussed today, I have made [name] aware that you have requested a prescription for Fosfomycin 3 G be called in to your outpatient pharmacy-- please listen for follow up from the outpatient pharmacy or from [name]' office staff  Below are the goals we discussed today:  Patient Self-Care Activities: Patient Adrian Rios will: Take medications as prescribed Attend all scheduled provider appointments Call pharmacy for medication refills Call provider office for new concerns or questions Continue to follow heart healthy, low salt, low cholesterol diet Continue to stay as active as possible; keep up the great work walking for exercise when the weather is nice Continue to take effort to prevent falls  Our next scheduled telephone follow up visit/ appointment is scheduled on: Wednesday, September 24, 2021 at 2:15 pm- This is a PHONE [place] appointment  If you need to cancel or re-schedule our visit, please call [phone] and our care guide team will be happy to assist you.   I look forward to hearing about your progress.   [name], RN, BSN, [place] [phone]: direct office  If you are experiencing a Mental Health or [place] or need someone to talk to, please  call the Suicide and Crisis Lifeline: 988 call the [place] National Suicide Prevention Lifeline: 616 688 0714 or TTY: 5670242678 TTY 321-201-9435) to talk to a trained counselor call 1-800-273-TALK (toll free, 24 hour hotline) go to [facility] [address], [place] [phone]) call 911   Patient verbalizes understanding of instructions and care plan provided today and agrees to view in [place]. Active  MyChart status and patient understanding of how to access instructions and care plan via MyChart confirmed with patient  Urinary Tract Infection, Adult  A urinary tract infection (UTI) is an infection of any part of the urinary tract. The urinary tract includes the kidneys, ureters, bladder, and urethra. These organs make, store, and get rid of urine in the body. An upper UTI affects the ureters and kidneys. A lower UTI affects the bladder and urethra. What are the causes? Most urinary tract infections are caused by bacteria in your genital area around your urethra, where urine leaves your body. These bacteria grow and cause inflammation of your urinary tract. What increases the risk? You are more likely to develop this condition if: You have a urinary catheter that stays in place. You are not able to control when you urinate or have a bowel movement (incontinence). You are male and you: Use a spermicide or diaphragm for birth control. Have low estrogen levels. Are pregnant. You have certain genes that increase your risk. You are sexually active. You take antibiotic medicines. You have a condition that causes your flow of urine to slow down, such as: An enlarged prostate, if you are male. Blockage in your urethra. A kidney stone. A nerve condition that affects your bladder control (neurogenic bladder). Not getting enough to drink, or not urinating often. You have certain medical conditions, such as: Diabetes. A weak disease-fighting system (immunesystem). Sickle cell disease. Gout. Spinal cord injury. What are the signs or symptoms? Symptoms of this condition include: Needing to urinate right away (urgency). Frequent urination. This may include small amounts of urine each time you urinate.  Pain or burning with urination. Blood in the urine. Urine that smells bad or unusual. Trouble urinating. Cloudy urine. Vaginal discharge, if you are male. Pain in the abdomen or the  lower back. You may also have: Vomiting or a decreased appetite. Confusion. Irritability or tiredness. A fever or chills. Diarrhea. The first symptom in older adults may be confusion. In some cases, they may not have any symptoms until the infection has worsened. How is this diagnosed? This condition is diagnosed based on your medical history and a physical exam. You may also have other tests, including: Urine tests. Blood tests. Tests for STIs (sexually transmitted infections). If you have had more than one UTI, a cystoscopy or imaging studies may be done to determine the cause of the infections. How is this treated? Treatment for this condition includes: Antibiotic medicine. Over-the-counter medicines to treat discomfort. Drinking enough water to stay hydrated. If you have frequent infections or have other conditions such as a kidney stone, you may need to see a health care provider who specializes in the urinary tract (urologist). In rare cases, urinary tract infections can cause sepsis. Sepsis is a life-threatening condition that occurs when the body responds to an infection. Sepsis is treated in the hospital with IV antibiotics, fluids, and other medicines. Follow these instructions at home:  Medicines Take over-the-counter and prescription medicines only as told by your health care provider. If you were prescribed an antibiotic medicine, take it as told by your health care provider. Do not stop using the antibiotic even if you start to feel better. General instructions Make sure you: Empty your bladder often and completely. Do not hold urine for long periods of time. Empty your bladder after sex. Wipe from front to back after urinating or having a bowel movement if you are male. Use each tissue only one time when you wipe. Drink enough fluid to keep your urine pale yellow. Keep all follow-up visits. This is important. Contact a health care provider if: Your symptoms do not  get better after 1-2 days. Your symptoms go away and then return. Get help right away if: You have severe pain in your back or your lower abdomen. You have a fever or chills. You have nausea or vomiting. Summary A urinary tract infection (UTI) is an infection of any part of the urinary tract, which includes the kidneys, ureters, bladder, and urethra. Most urinary tract infections are caused by bacteria in your genital area. Treatment for this condition often includes antibiotic medicines. If you were prescribed an antibiotic medicine, take it as told by your health care provider. Do not stop using the antibiotic even if you start to feel better. Keep all follow-up visits. This is important. This information is not intended to replace advice given to you by your health care provider. Make sure you discuss any questions you have with your health care provider. Document Revised: 09/29/2019 Document Reviewed: 09/29/2019 Elsevier Patient Education  [place].

## 2021-08-28 NOTE — Chronic Care Management (AMB) (Signed)
Chronic Care Management   CCM Adrian Rios Visit Note  08/28/2021 Name: Adrian Rios MRN: [id number] DOB: 1940/04/24  Subjective: Adrian Rios is a 81 y.o. year old male who is a primary care patient of Burns, Claudina Lick, MD. The care management team was consulted for assistance with disease management and care coordination needs.    Engaged with patient by telephone for follow up visit in response to provider referral for case management and/or care coordination services.   Consent to Services:  The patient was given information about Chronic Care Management services, agreed to services, and gave verbal consent prior to initiation of services.  Please see initial visit note for detailed documentation.  Patient agreed to services and verbal consent obtained.   Assessment: Review of patient past medical history, allergies, medications, health status, including review of consultants reports, laboratory and other test data, was performed as part of comprehensive evaluation and provision of chronic care management services.   SDOH (Social Determinants of Health) assessments and interventions performed:  SDOH Interventions    Flowsheet Row Most Recent Value  SDOH Interventions   Food Insecurity Interventions Intervention Not Indicated  [continues to deny food insecurity]  Transportation Interventions Intervention Not Indicated  [Patient continues to drive self]     CCM Care Plan  Allergies  Allergen Reactions   Ivp Dye [Iodinated Contrast Media] Shortness Of Breath, Itching and Palpitations    "eyes itching,  Heart racing,  Effected breathing" 10-27-16 pt with 13 hr pre-meds for CT without any reaction-kj   Metrizamide Shortness Of Breath, Itching and Palpitations     "eyes itching,  Heart racing,  Effected breathing" 10-27-16 pt with 13 hr pre-meds for CT without any reaction-kj   Shellfish Allergy Hives, Shortness Of Breath and Itching    Mostly crab   Gabapentin Other (See Comments)     REACTION: dizziness and flushing   Niacin-Lovastatin Er Other (See Comments)    Did not feel good on medication   Gadolinium Hives   Adhesive [Tape] Rash    Use paper tape   Outpatient Encounter Medications as of 08/28/2021  Medication Sig   acetaminophen (TYLENOL) 500 MG tablet Take 1,000 mg by mouth every 6 (six) hours as needed for headache or fever (pain).   ADVAIR DISKUS 250-50 MCG/ACT AEPB INHALE 1 PUFF INTO THE LUNGS EVERY 12 HOURS   albuterol (VENTOLIN HFA) 108 (90 Base) MCG/ACT inhaler Inhale 2 puffs into the lungs every 6 (six) hours as needed for wheezing or shortness of breath.   fosfomycin (MONUROL) 3 g PACK Take 3 g by mouth every other day.   latanoprost (XALATAN) 0.005 % ophthalmic solution Place 1 drop into the right eye at bedtime.   magnesium oxide (MAG-OX) 400 MG tablet Take 400 mg by mouth daily with supper.   methenamine (HIPREX) 1 g tablet Take 1 tablet (1 g total) by mouth 2 (two) times daily with a meal.   Misc Natural Products (GLUCOSAMINE CHOND DOUBLE STR PO) Take 1 tablet by mouth 2 (two) times daily.   montelukast (SINGULAIR) 10 MG tablet Take 1 tablet (10 mg total) by mouth at bedtime.   triamcinolone (NASACORT) 55 MCG/ACT AERO nasal inhaler Place 1 spray into the nose at bedtime.   venlafaxine (EFFEXOR) 75 MG tablet TAKE 1.5 TABLETS BY MOUTH DAILY   No facility-administered encounter medications on file as of 08/28/2021.   Patient Active Problem List   Diagnosis Date Noted   Hypomagnesemia 06/25/2021   Left shoulder pain 05/01/2021  Right flank pain 03/17/2021   CKD (chronic kidney disease) stage 3, GFR 30-59 ml/min (HCC) 03/17/2021   Pyelonephritis 74/94/4967   Complicated UTI (urinary tract infection) 11/24/2020   Sepsis due to complicated UTI and patient with history of ESBL UTI 11/24/2020   History of ESBL E. 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   Carotid bruit 02/17/2016   Prediabetes 08/20/2015   Glaucoma 02/14/2015   Varicose veins of lower extremities with other complications 65/99/3570   Asthma 10/27/2011   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[place]), 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   PREMATURE VENTRICULAR CONTRACTIONS 04/29/2010   ERECTILE DYSFUNCTION 04/23/2010   [place] DISEASE, LUMBOSACRAL SPINE 02/06/2010   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   Depression 04/12/2008   Allergic rhinitis 04/12/2008   Hyperlipidemia 10/05/2006   Sarcoidosis ([place]) 07/05/2006   Conditions to be addressed/monitored:  Asthma and recurrent UTI in setting of ileal conduit/ history bladder CA  Care Plan : Adrian Rios Care Manager Plan of Care  Updates made by Adrian Royalty, Adrian Rios since 08/28/2021 12:00 AM     Problem: Chronic Disease Management Needs   Priority: High     Long-Range Goal: Ongoing adherence to established plan of care for long term chronic disease management   Start Date: 02/13/2021  Expected End Date: 02/13/2022  Priority: High  Note:   Current Barriers:  Chronic Disease Management support and education needs related to asthma, recurrent UTI Multiple hospitalizations for recurrent UTI in setting of bladder CA with ilieal conduit,  January 15-18, 2023 April 14-17, 2023 ED visits with discharge home: 05/23/21; 07/23/21  RNCM Clinical Goal(s):  Patient will demonstrate ongoing health management independence as evidenced by adherence to plan of care for self- management of asthma, recurrent UTI        through collaboration with Adrian Rios Care manager,  provider, and care team.   Interventions: 1:1 collaboration with primary care provider regarding development and update of comprehensive plan of care as evidenced by provider attestation and co-signature Inter-disciplinary care team collaboration (see longitudinal plan of care) Evaluation of current treatment plan related to  self management and patient's adherence to plan as established by provider Review of patient status, including review of consultants reports, relevant laboratory and other test results, and medications completed SDOH updated: no new/ unmet concerns identified Pain assessment updated: denies pain today Falls assessment updated: continues to report 2 falls without serious injury x 12 months: Oct 2022 and April 2023- currently not using assistive devices;  encouragement provided to continue efforts at fall prevention; previously provided education around fall risks/ prevention reinforced Medications discussed: reports continues to independently self-manage and denies current concerns/ issues/ questions around medications; endorses adherence to taking all medications as prescribed Patient requests that I ask [name] on his behalf for a prescription for "Fosfomycin 3 G, q 48 hours" for prophylaxis "just in case" he has signs/ symptoms UTI while he is out of town for 10 days- reports he is leaving on Thursday 09/11/21 for church camp meeting and  will not be near reliable health care facility; he wants to "be prepared" just in case he develops symptoms-- I will place request from PCP as he has requested Reviewed recent hospitalizations and ED visits since our last outreach- he reports doing well now; denies clinical concerns today and denies questions Reviewed upcoming scheduled provider appointments: none- confirmed that patient continues to be followed regularly by [facility] and [facility] Discussed plans with patient for ongoing care management follow up and provided patient  with direct contact information for care management team     Asthma: (Status: 08/28/21: Goal on Track (progressing): YES.) Long Term Goal  Advised patient to self assesses Asthma action plan zone and make appointment with provider if in the yellow zone for 48 hours without improvement; Discussed the importance of adequate rest and management of fatigue with Asthma; Discussed with patient current clinical condition/ breathing status: he denies problems, concerns, issues around breathing status; states "breathing has been fine lately" Denies issues/ concerns around medications, reports ongoing adherence to all prescribed medications Denies issues again today with previously reported swallowing issues: reports "appetite is fine, swallowing has been okay" Confirmed patient continues to try and stay as active "as possible" tolerates well; positive reinforcement provided with encouragement to continue efforts  Recurrent UTI in setting of history of bladder CA with ileal conduit  (Status: 08/28/21: Goal on Track (progressing): YES.) Long Term Goal  Evaluation of current treatment plan related to  Recurrent UTI ,  self-management and patient's adherence to plan as established by provider. Confirmed patient is followed regularly by urology and infection disease providers at [facility]-- reports he has maintained visits with [facility]; continues taking methenamine 1 G BID, along with vitamin C, which he believes is helping to decrease/ prevent frequency of UTI occurrence Confirmed wife assists as indicated/ needed for management of ileal conduit- patient reports continues managing well Reinforced previously provided education around signs/ symptoms UTI: patient has ongoing good baseline understanding of same; reports action plan of "seeks advice/ care immediately" if he notices signs/ symptoms UTI; he adds that he continues to understand to immediately take urine sample to office if he starts to develop signs/  symptoms UTI  Patient Goals/Self-Care Activities: As evidenced by review of EHR, collaboration with care team, and patient reporting during CCM Adrian Rios CM outreach,  Patient [place] will: Take medications as prescribed Attend all scheduled provider appointments Call pharmacy for medication refills Call provider office for new concerns or questions Continue to follow heart healthy, low salt, low cholesterol diet Continue to stay as active as possible; keep up the great work walking for exercise when the weather is nice Continue to take effort to prevent falls    Plan: Telephone follow up appointment with care management team member scheduled for:  Wednesday September 24, 2021 at 2:15 pm The patient has been provided with contact information for the care management team and has been advised to call with any health related questions or concerns  [name], Adrian Rios, BSN, [place] [phone]: direct office

## 2021-08-29 DIAGNOSIS — J4520 Mild intermittent asthma, uncomplicated: Secondary | ICD-10-CM

## 2021-09-02 ENCOUNTER — Encounter: Payer: Self-pay | Admitting: Internal Medicine

## 2021-09-24 ENCOUNTER — Ambulatory Visit: Payer: Medicare Other | Admitting: *Deleted

## 2021-09-24 DIAGNOSIS — N390 Urinary tract infection, site not specified: Secondary | ICD-10-CM

## 2021-09-24 DIAGNOSIS — J4520 Mild intermittent asthma, uncomplicated: Secondary | ICD-10-CM

## 2021-09-24 NOTE — Chronic Care Management (AMB) (Signed)
Care Management    RN Visit Note  09/24/2021 Name: Adrian Rios MRN: [id number] DOB: Aug 10, 1940  Subjective: Adrian Rios is a 81 y.o. year old male who is a primary care patient of Burns, Claudina Lick, MD. The care management team was consulted for assistance with disease management and care coordination needs.    Engaged with patient by telephone for follow up visit/ RN CM case closure in response to provider referral for case management and/or care coordination services.   Consent to Services:   Adrian Rios was given information about Care Management services 02/05/21 including:  Care Management services includes personalized support from designated clinical staff supervised by his physician, including individualized plan of care and coordination with other care providers 24/7 contact phone numbers for assistance for urgent and routine care needs. The patient may stop case management services at any time by phone call to the office staff.  Patient agreed to services and consent obtained.   Assessment: Review of patient past medical history, allergies, medications, health status, including review of consultants reports, laboratory and other test data, was performed as part of comprehensive evaluation and provision of chronic care management services.   SDOH (Social Determinants of Health) assessments and interventions performed:  SDOH Interventions    Flowsheet Row Most Recent Value  SDOH Interventions   Food Insecurity Interventions Intervention Not Indicated  [continues to deny food insecurity]  Transportation Interventions Intervention Not Indicated  [patient continues driving self,  spouse assists as/ if indicated]     Care Plan  Allergies  Allergen Reactions   Ivp Dye [Iodinated Contrast Media] Shortness Of Breath, Itching and Palpitations    "eyes itching,  Heart racing,  Effected breathing" 10-27-16 pt with 13 hr pre-meds for CT without any reaction-kj   Metrizamide Shortness  Of Breath, Itching and Palpitations     "eyes itching,  Heart racing,  Effected breathing" 10-27-16 pt with 13 hr pre-meds for CT without any reaction-kj   Shellfish Allergy Hives, Shortness Of Breath and Itching    Mostly crab   Gabapentin Other (See Comments)    REACTION: dizziness and flushing   Niacin-Lovastatin Er Other (See Comments)    Did not feel good on medication   Gadolinium Hives   Adhesive [Tape] Rash    Use paper tape   Outpatient Encounter Medications as of 09/24/2021  Medication Sig   acetaminophen (TYLENOL) 500 MG tablet Take 1,000 mg by mouth every 6 (six) hours as needed for headache or fever (pain).   ADVAIR DISKUS 250-50 MCG/ACT AEPB INHALE 1 PUFF INTO THE LUNGS EVERY 12 HOURS   albuterol (VENTOLIN HFA) 108 (90 Base) MCG/ACT inhaler Inhale 2 puffs into the lungs every 6 (six) hours as needed for wheezing or shortness of breath.   fosfomycin (MONUROL) 3 g PACK Take 3 g by mouth every other day.   latanoprost (XALATAN) 0.005 % ophthalmic solution Place 1 drop into the right eye at bedtime.   magnesium oxide (MAG-OX) 400 MG tablet Take 400 mg by mouth daily with supper.   methenamine (HIPREX) 1 g tablet Take 1 tablet (1 g total) by mouth 2 (two) times daily with a meal.   Misc Natural Products (GLUCOSAMINE CHOND DOUBLE STR PO) Take 1 tablet by mouth 2 (two) times daily.   montelukast (SINGULAIR) 10 MG tablet Take 1 tablet (10 mg total) by mouth at bedtime.   triamcinolone (NASACORT) 55 MCG/ACT AERO nasal inhaler Place 1 spray into the nose at bedtime.  venlafaxine (EFFEXOR) 75 MG tablet TAKE 1.5 TABLETS BY MOUTH DAILY   No facility-administered encounter medications on file as of 09/24/2021.   Patient Active Problem List   Diagnosis Date Noted   Hypomagnesemia 06/25/2021   Left shoulder pain 05/01/2021   Right flank pain 03/17/2021   CKD (chronic kidney disease) stage 3, GFR 30-59 ml/min ([place]) 03/17/2021   Pyelonephritis 01/75/1025   Complicated UTI (urinary  tract infection) 11/24/2020   Sepsis due to complicated UTI and patient with history of ESBL UTI 11/24/2020   History of ESBL E. 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[place]) 06/12/2016   Carotid bruit 02/17/2016   Prediabetes 08/20/2015   Glaucoma 02/14/2015   Varicose veins of lower extremities with other complications 23/53/6144   Asthma 10/27/2011   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[place]), 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   PREMATURE VENTRICULAR CONTRACTIONS 04/29/2010   ERECTILE DYSFUNCTION 04/23/2010   [place] DISEASE, LUMBOSACRAL SPINE 02/06/2010   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   Depression 04/12/2008   Allergic rhinitis 04/12/2008   Hyperlipidemia 10/05/2006   Sarcoidosis ([place]) 07/05/2006   Conditions to be addressed/monitored: Asthma and recurrent UTI  Care Plan : RN Care Manager Plan of Care  Updates made by Adrian Royalty, RN since 09/24/2021 12:00 AM     Problem: Chronic Disease Management Needs   Priority: High     Long-Range Goal: Ongoing adherence to established plan of care for long term chronic disease management   Start Date: 02/13/2021  Expected End Date: 02/13/2022  Priority: High  Note:   Current Barriers:  Chronic Disease Management support and education needs related to asthma, recurrent UTI Multiple hospitalizations for recurrent UTI in setting of bladder CA with ilieal conduit,  January 15-18, 2023 April 14-17, 2023 ED visits with discharge home: 05/23/21; 07/23/21 09/24/21:  verified no recent hospitalizations since last outreach 08/28/21  RNCM Clinical Goal(s):  Patient will demonstrate ongoing health management independence as evidenced by adherence to plan of care for self- management of asthma, recurrent UTI        through collaboration with RN Care manager, provider, and care team.   Interventions: 1:1 collaboration with primary care provider regarding development and update of comprehensive plan of care as evidenced by provider attestation and co-signature Inter-disciplinary care team collaboration (see longitudinal plan of care) Evaluation of current treatment plan related to  self management and patient's adherence to plan as established by provider Review of patient status, including review of consultants reports, relevant laboratory and other test results, and medications completed SDOH updated: no new/ unmet concerns identified Falls assessment updated: continues to deny new/ recent falls, since last fall without injury in April 2023- currently not using assistive devices;  encouragement provided to continue efforts at fall prevention; previously provided education around fall risks/ prevention reinforced Medications discussed: reports continues to independently self-manage and denies current concerns/ issues/ questions around medications; endorses adherence to taking all medications as prescribed Reviewed upcoming scheduled provider appointments: none- confirmed that patient continues to be followed regularly  by [facility] and [facility]; he continues to verbalize plan to immediately go to PCP lab for urinalysis promptly should signs/ symptoms UTI develop Discussed plans with patient for ongoing care management follow up- patient denies current care coordination/ care management needs and is agreeable to CCM RN CM case closure today; verbalizes understanding to contact PCP or other care providers for any needs that arise in the future, and confirms he has  contact information for all care providers     Asthma: (Status: 09/24/21: Goal Met.) Long Term Goal  Advised patient to self assesses Asthma action plan zone and make appointment with provider if in the yellow zone for 48 hours without improvement; Discussed the importance of adequate rest and management of fatigue with Asthma; Discussed with patient current clinical condition/ breathing status: he denies problems, concerns, issues around breathing status; states "breathing has been fine lately;" confirms he continues using maintenance inhaler QD as prescribed; reports has not needed rescue inhaler, "in quite a long time" Denies issues/ concerns around medications, reports ongoing adherence to all prescribed medications Confirmed patient continues to try and stay as active "as possible" tolerates well; positive reinforcement provided with encouragement to continue efforts  Recurrent UTI in setting of history of bladder CA with ileal conduit  (Status: 09/24/21: Goal Met.) Long Term Goal  Evaluation of current treatment plan related to  Recurrent UTI ,  self-management and patient's adherence to plan as established by provider. Confirmed patient is followed regularly by urology and infection disease providers at [facility]-- reports he has maintained visits with [facility]; continues taking methenamine 1 G BID, along with vitamin C, which he believes is helping to decrease/ prevent frequency of UTI occurrence Confirmed wife assists as indicated/ needed for management of ileal conduit- patient reports continues managing well and denies recent issues/ concerns, problems Reinforced previously provided education around signs/ symptoms UTI: patient has ongoing good baseline understanding of same; reports action plan of "seeks advice/ care immediately" if he notices signs/ symptoms UTI; he adds that he continues to understand to immediately take urine sample to PCP office if he starts to develop signs/ symptoms  UTI     Plan:  No further follow up required: patient denies current care coordination/ care management needs and is agreeable to RN CM case closure today; case closure accordingly     [name], RN, BSN, [place] [phone]: direct office

## 2021-09-29 DIAGNOSIS — H2512 Age-related nuclear cataract, left eye: Secondary | ICD-10-CM | POA: Diagnosis not present

## 2021-09-29 DIAGNOSIS — D2310 Other benign neoplasm of skin of unspecified eyelid, including canthus: Secondary | ICD-10-CM | POA: Diagnosis not present

## 2021-09-29 DIAGNOSIS — S0551XD Penetrating wound with foreign body of right eyeball, subsequent encounter: Secondary | ICD-10-CM | POA: Diagnosis not present

## 2021-09-29 DIAGNOSIS — H4089 Other specified glaucoma: Secondary | ICD-10-CM | POA: Diagnosis not present

## 2021-09-29 DIAGNOSIS — Z961 Presence of intraocular lens: Secondary | ICD-10-CM | POA: Diagnosis not present

## 2021-10-09 ENCOUNTER — Other Ambulatory Visit: Payer: Self-pay

## 2021-10-09 ENCOUNTER — Encounter (HOSPITAL_BASED_OUTPATIENT_CLINIC_OR_DEPARTMENT_OTHER): Payer: Self-pay | Admitting: Emergency Medicine

## 2021-10-09 ENCOUNTER — Encounter (HOSPITAL_COMMUNITY): Payer: Self-pay

## 2021-10-09 ENCOUNTER — Other Ambulatory Visit (INDEPENDENT_AMBULATORY_CARE_PROVIDER_SITE_OTHER): Payer: Medicare Other

## 2021-10-09 ENCOUNTER — Emergency Department (HOSPITAL_BASED_OUTPATIENT_CLINIC_OR_DEPARTMENT_OTHER): Payer: Medicare Other

## 2021-10-09 ENCOUNTER — Observation Stay (HOSPITAL_BASED_OUTPATIENT_CLINIC_OR_DEPARTMENT_OTHER)
Admission: EM | Admit: 2021-10-09 | Discharge: 2021-10-11 | Disposition: A | Discharge status: Home / Self Care | Payer: Medicare Other | Attending: Internal Medicine | Admitting: Internal Medicine

## 2021-10-09 DIAGNOSIS — Z8616 Personal history of COVID-19: Secondary | ICD-10-CM | POA: Diagnosis not present

## 2021-10-09 DIAGNOSIS — F32A Depression, unspecified: Secondary | ICD-10-CM | POA: Diagnosis present

## 2021-10-09 DIAGNOSIS — J45909 Unspecified asthma, uncomplicated: Secondary | ICD-10-CM | POA: Diagnosis not present

## 2021-10-09 DIAGNOSIS — Z85828 Personal history of other malignant neoplasm of skin: Secondary | ICD-10-CM | POA: Insufficient documentation

## 2021-10-09 DIAGNOSIS — A419 Sepsis, unspecified organism: Secondary | ICD-10-CM | POA: Diagnosis present

## 2021-10-09 DIAGNOSIS — Z8546 Personal history of malignant neoplasm of prostate: Secondary | ICD-10-CM | POA: Insufficient documentation

## 2021-10-09 DIAGNOSIS — R109 Unspecified abdominal pain: Secondary | ICD-10-CM | POA: Diagnosis present

## 2021-10-09 DIAGNOSIS — A4151 Sepsis due to Escherichia coli [E. coli]: Principal | ICD-10-CM | POA: Insufficient documentation

## 2021-10-09 DIAGNOSIS — N1330 Unspecified hydronephrosis: Secondary | ICD-10-CM | POA: Diagnosis not present

## 2021-10-09 DIAGNOSIS — R300 Dysuria: Secondary | ICD-10-CM | POA: Diagnosis not present

## 2021-10-09 DIAGNOSIS — Z8551 Personal history of malignant neoplasm of bladder: Secondary | ICD-10-CM | POA: Insufficient documentation

## 2021-10-09 DIAGNOSIS — Z79899 Other long term (current) drug therapy: Secondary | ICD-10-CM | POA: Diagnosis not present

## 2021-10-09 DIAGNOSIS — K5730 Diverticulosis of large intestine without perforation or abscess without bleeding: Secondary | ICD-10-CM | POA: Diagnosis not present

## 2021-10-09 DIAGNOSIS — N281 Cyst of kidney, acquired: Secondary | ICD-10-CM | POA: Diagnosis not present

## 2021-10-09 DIAGNOSIS — N390 Urinary tract infection, site not specified: Secondary | ICD-10-CM | POA: Diagnosis not present

## 2021-10-09 DIAGNOSIS — N200 Calculus of kidney: Secondary | ICD-10-CM | POA: Diagnosis not present

## 2021-10-09 LAB — COMPREHENSIVE METABOLIC PANEL
ALT: 13 U/L (ref 0–44)
AST: 17 U/L (ref 15–41)
Albumin: 3.5 g/dL (ref 3.5–5.0)
Alkaline Phosphatase: 94 U/L (ref 38–126)
Anion gap: 8 (ref 5–15)
BUN: 18 mg/dL (ref 8–23)
CO2: 20 mmol/L — ABNORMAL LOW (ref 22–32)
Calcium: 8.4 mg/dL — ABNORMAL LOW (ref 8.9–10.3)
Chloride: 108 mmol/L (ref 98–111)
Creatinine, Ser: 1.16 mg/dL (ref 0.61–1.24)
GFR, Estimated: >60 mL/min (ref >60)
Glucose, Bld: 120 mg/dL — ABNORMAL HIGH (ref 70–99)
Potassium: 3.6 mmol/L (ref 3.5–5.1)
Sodium: 136 mmol/L (ref 135–145)
Total Bilirubin: 0.5 mg/dL (ref 0.3–1.2)
Total Protein: 7.2 g/dL (ref 6.5–8.1)

## 2021-10-09 LAB — URINALYSIS, ROUTINE W REFLEX MICROSCOPIC
Bilirubin Urine: NEGATIVE
Bilirubin Urine: NEGATIVE
Glucose, UA: NEGATIVE mg/dL
Ketones, ur: NEGATIVE
Ketones, ur: NEGATIVE mg/dL
Nitrite: POSITIVE — AB
Nitrite: POSITIVE — AB
Protein, ur: NEGATIVE mg/dL
Specific Gravity, Urine: <=1.005 — AB (ref 1.000–1.030)
Specific Gravity, Urine: 1.015 (ref 1.005–1.030)
Total Protein, Urine: NEGATIVE
Urine Glucose: NEGATIVE
Urobilinogen, UA: 0.2 (ref 0.0–1.0)
pH: 6.5 (ref 5.0–8.0)
pH: 6.5 (ref 5.0–8.0)

## 2021-10-09 LAB — CBC WITH DIFFERENTIAL/PLATELET
Abs Immature Granulocytes: 0.05 10*3/uL (ref 0.00–0.07)
Basophils Absolute: 0.1 10*3/uL (ref 0.0–0.1)
Basophils Relative: 0 %
Eosinophils Absolute: 0.1 10*3/uL (ref 0.0–0.5)
Eosinophils Relative: 1 %
HCT: 37.5 % — ABNORMAL LOW (ref 39.0–52.0)
Hemoglobin: 12.5 g/dL — ABNORMAL LOW (ref 13.0–17.0)
Immature Granulocytes: 0 %
Lymphocytes Relative: 13 %
Lymphs Abs: 1.8 10*3/uL (ref 0.7–4.0)
MCH: 30.4 pg (ref 26.0–34.0)
MCHC: 33.3 g/dL (ref 30.0–36.0)
MCV: 91.2 fL (ref 80.0–100.0)
Monocytes Absolute: 1 10*3/uL (ref 0.1–1.0)
Monocytes Relative: 7 %
Neutro Abs: 10.9 10*3/uL — ABNORMAL HIGH (ref 1.7–7.7)
Neutrophils Relative %: 79 %
Platelets: 347 10*3/uL (ref 150–400)
RBC: 4.11 MIL/uL — ABNORMAL LOW (ref 4.22–5.81)
RDW: 13.2 % (ref 11.5–15.5)
WBC: 13.9 10*3/uL — ABNORMAL HIGH (ref 4.0–10.5)
nRBC: 0 % (ref 0.0–0.2)

## 2021-10-09 LAB — LACTIC ACID, PLASMA: Lactic Acid, Venous: 1.1 mmol/L (ref 0.5–1.9)

## 2021-10-09 LAB — URINALYSIS, MICROSCOPIC (REFLEX)

## 2021-10-09 LAB — LIPASE, BLOOD: Lipase: 35 U/L (ref 11–51)

## 2021-10-09 MED ORDER — SODIUM CHLORIDE 0.9 % IV SOLN
1.0000 g | Freq: Two times a day (BID) | INTRAVENOUS | Status: AC
  Administered 2021-10-10 (×2): 1 g via INTRAVENOUS
  Filled 2021-10-09 (×2): qty 20

## 2021-10-09 MED ORDER — SODIUM CHLORIDE 0.9 % IV SOLN
1.0000 g | Freq: Once | INTRAVENOUS | Status: AC
  Administered 2021-10-09: 1 g via INTRAVENOUS

## 2021-10-09 MED ORDER — SODIUM CHLORIDE 0.9 % IV BOLUS
1000.0000 mL | Freq: Once | INTRAVENOUS | Status: AC
  Administered 2021-10-09: 1000 mL via INTRAVENOUS

## 2021-10-09 MED ORDER — ACETAMINOPHEN 325 MG PO TABS
650.0000 mg | ORAL_TABLET | Freq: Once | ORAL | Status: AC
  Administered 2021-10-09: 650 mg via ORAL
  Filled 2021-10-09: qty 2

## 2021-10-09 MED ORDER — SODIUM CHLORIDE 0.9 % IV SOLN: 1.0000 g | Freq: Three times a day (TID) | INTRAVENOUS | Status: DC

## 2021-10-09 NOTE — ED Notes (Signed)
EDP Rancour made aware of pts elevated temp of 101.1

## 2021-10-09 NOTE — ED Notes (Signed)
Pt reports taking 3g sachet of fosfomycin prior to arrival to ED.  Pt also requesting tylenol for headache/backache 4/10. EDP Rancour made aware.

## 2021-10-09 NOTE — ED Notes (Signed)
Pts O2 noted to be 89% on RA.  Pt denies ShOB, denies wearing CPAP at night.  RT made aware. EDP Rancour made aware.

## 2021-10-09 NOTE — ED Provider Notes (Signed)
[facility] EMERGENCY DEPARTMENT Provider Note   CSN: [id number] Arrival date & time: 10/09/21  1947     History  Chief Complaint  Patient presents with   Flank Pain    Adrian Rios is a 81 y.o. male.  Adrian Rios is an 81 year old male with past medical history significant for bladder cancer s/p cystectomy with ileal conduit April 2018, history of recurrent UTIs/pyelonephritis, sarcoidosis, chronic bilateral hydronephrosis, recent history of ESBL UTI.  Presents with concern for recurrent UTI.  States he woke up overnight with chills, aches and subjective fever and feeling "like he had the flu".  Feels similar to when he had a previous kidney infection.  Went to his PCPs office where they told him his urinalysis was infected.  Sent to the ED.  Complains of cloudiness and sediment to his urostomy bag.  He denies any chest pain or difficulty breathing.  Has mild diffuse crampy abdominal pain.  No chest pain.  No cough, runny nose or sore throat.  Breathing is at baseline  The history is provided by the patient and the spouse.  Flank Pain Pertinent negatives include no shortness of breath.       Home Medications Prior to Admission medications   Medication Sig Start Date End Date Taking? Authorizing Provider  acetaminophen (TYLENOL) 500 MG tablet Take 1,000 mg by mouth every 6 (six) hours as needed for headache or fever (pain).    [provider]  ADVAIR DISKUS 250-50 MCG/ACT AEPB INHALE 1 PUFF INTO THE LUNGS EVERY 12 HOURS 07/01/21   Burns, Claudina Lick, MD  albuterol (VENTOLIN HFA) 108 (90 Base) MCG/ACT inhaler Inhale 2 puffs into the lungs every 6 (six) hours as needed for wheezing or shortness of breath. 02/01/20   [name], MD  fosfomycin (MONUROL) 3 g PACK Take 3 g by mouth every other day. 07/23/21   Drenda Freeze, MD  latanoprost (XALATAN) 0.005 % ophthalmic solution Place 1 drop into the right eye at bedtime. 05/06/20   [provider]  magnesium  oxide (MAG-OX) 400 MG tablet Take 400 mg by mouth daily with supper.    [provider]  methenamine (HIPREX) 1 g tablet Take 1 tablet (1 g total) by mouth 2 (two) times daily with a meal. 06/21/21   Gonfa, Charlesetta Ivory, MD  Misc Natural Products (GLUCOSAMINE CHOND DOUBLE STR PO) Take 1 tablet by mouth 2 (two) times daily.    [provider]  montelukast (SINGULAIR) 10 MG tablet Take 1 tablet (10 mg total) by mouth at bedtime. 01/27/21   [name], MD  triamcinolone (NASACORT) 55 MCG/ACT AERO nasal inhaler Place 1 spray into the nose at bedtime.    [provider]  venlafaxine (EFFEXOR) 75 MG tablet TAKE 1.5 TABLETS BY MOUTH DAILY 07/17/21   [name], MD      Allergies    Ivp dye [iodinated contrast media], Metrizamide, Shellfish allergy, Gabapentin, Niacin-lovastatin er, Gadolinium, and Adhesive [tape]    Review of Systems   Review of Systems  Constitutional:  Positive for activity change and appetite change.  Respiratory:  Negative for cough, chest tightness and shortness of breath.   Genitourinary:  Positive for dysuria and flank pain.   all other systems are negative except as noted in the HPI and PMH.    Physical Exam Updated Vital Signs BP 120/81   Pulse 100   Temp 98.8 F (37.1 C) (Oral)   Resp 20   Ht  $'5\' 7"'P$  (1.702 m)   Wt 78 kg   SpO2 93%   BMI 26.94 kg/m  Physical Exam Vitals and nursing note reviewed.  Constitutional:      General: He is not in acute distress.    Appearance: He is well-developed.  HENT:     Head: Normocephalic and atraumatic.     Mouth/Throat:     Pharynx: No oropharyngeal exudate.  Eyes:     Conjunctiva/sclera: Conjunctivae normal.     Pupils: Pupils are equal, round, and reactive to light.  Neck:     Comments: No meningismus. Cardiovascular:     Rate and Rhythm: Normal rate and regular rhythm.     Heart sounds: Normal heart sounds. No murmur heard. Pulmonary:     Effort: Pulmonary effort is normal. No  respiratory distress.     Breath sounds: Normal breath sounds.  Abdominal:     Palpations: Abdomen is soft.     Tenderness: There is no abdominal tenderness. There is no guarding or rebound.     Comments: Ileal conduit in place, cloudy yellow urine in bag.  Abdomen soft without peritoneal signs  Musculoskeletal:        General: No tenderness. Normal range of motion.     Cervical back: Normal range of motion and neck supple.  Skin:    General: Skin is warm.  Neurological:     Mental Status: He is alert and oriented to person, place, and time.     Cranial Nerves: No cranial nerve deficit.     Motor: No abnormal muscle tone.     Coordination: Coordination normal.     Comments:  5/5 strength throughout. CN 2-12 intact.Equal grip strength.   Psychiatric:        Behavior: Behavior normal.     ED Results / Procedures / Treatments   Labs (all labs ordered are listed, but only abnormal results are displayed) Labs Reviewed  URINALYSIS, ROUTINE W REFLEX MICROSCOPIC - Abnormal; Notable for the following components:      Result Value   Hgb urine dipstick TRACE (*)    Nitrite POSITIVE (*)    Leukocytes,Ua SMALL (*)    All other components within normal limits  CBC WITH DIFFERENTIAL/PLATELET - Abnormal; Notable for the following components:   WBC 13.9 (*)    RBC 4.11 (*)    Hemoglobin 12.5 (*)    HCT 37.5 (*)    Neutro Abs 10.9 (*)    All other components within normal limits  COMPREHENSIVE METABOLIC PANEL - Abnormal; Notable for the following components:   CO2 20 (*)    Glucose, Bld 120 (*)    Calcium 8.4 (*)    All other components within normal limits  URINALYSIS, MICROSCOPIC (REFLEX) - Abnormal; Notable for the following components:   Bacteria, UA FEW (*)    All other components within normal limits  CULTURE, BLOOD (ROUTINE X 2)  CULTURE, BLOOD (ROUTINE X 2)  URINE CULTURE  LACTIC ACID, PLASMA  LIPASE, BLOOD  LACTIC ACID, PLASMA    EKG None  Radiology CT Renal Stone  Study  Result Date: 10/09/2021 CLINICAL DATA:  Flank pain, kidney stone suspected Patient reports fever and history of kidney infection. Stick to me with spondylitic. EXAM: CT ABDOMEN AND PELVIS WITHOUT CONTRAST TECHNIQUE: Multidetector CT imaging of the abdomen and pelvis was performed following the standard protocol without IV contrast. RADIATION DOSE REDUCTION: This exam was performed according to the departmental dose-optimization program which includes automated exposure control, adjustment of  the mA and/or kV according to patient size and/or use of iterative reconstruction technique. COMPARISON:  Most recent CT 07/23/2021 FINDINGS: Lower chest: Right greater than left lower lobe atelectasis. No pleural effusion. Hepatobiliary: No focal liver abnormality is seen. No gallstones, gallbladder wall thickening, or biliary dilatation. Pancreas: No ductal dilatation or inflammation. Spleen: Normal in size without focal abnormality. Adrenals/Urinary Tract: Normal adrenal glands. There is chronic bilateral hydroureteronephrosis. This has improved from prior exam. The ureters are dilated to the ileal conduit. Conduit is nondistended. No peristomal hernia. There is no evidence of renal inflammation. Nonobstructing stone in the lower right kidney. Cortical scarring in the upper left kidney. Simple bilateral renal cysts. These need no further follow-up. Prior cystectomy. Stomach/Bowel: Unremarkable appearance of the stomach. No small bowel obstruction or inflammation. Enteric sutures in the distal ileum. Small bowel adjacent to the enteric sutures is patulous but noninflamed or obstructed. Moderate colonic diverticulosis. No focal diverticulitis or acute colonic inflammation. Prior appendectomy Vascular/Lymphatic: Moderate aortic atherosclerosis. No aneurysm. No bulky abdominopelvic adenopathy Reproductive: Prostatectomy. Other: No ascites, free air or focal fluid collection. Small fat containing left inguinal hernia.  Musculoskeletal: Stable sclerotic focus within L4 vertebral body, dissolved vague sclerosis involving inferior L3 vertebral body multilevel degenerative disc disease and facet hypertrophy. No acute osseous findings. IMPRESSION: 1. Chronic but improved bilateral hydroureteronephrosis. The ureters are dilated to the ileal conduit. No evidence of renal inflammation. 2. Nonobstructing right renal stone. 3. Colonic diverticulosis without acute inflammation. Aortic Atherosclerosis (ICD10-I70.0). Electronically Signed   By: Keith Rake M.D.   On: 10/09/2021 21:53    Procedures .Critical Care  Performed by: Ezequiel Essex, MD Authorized by: Ezequiel Essex, MD   Critical care provider statement:    Critical care time (minutes):  35   Critical care time was exclusive of:  Separately billable procedures and treating other patients   Critical care was necessary to treat or prevent imminent or life-threatening deterioration of the following conditions:  Sepsis   Critical care was time spent personally by me on the following activities:  Development of treatment plan with patient or surrogate, discussions with consultants, evaluation of patient's response to treatment, examination of patient, ordering and review of laboratory studies, ordering and review of radiographic studies, ordering and performing treatments and interventions, pulse oximetry, re-evaluation of patient's condition and review of old charts   I assumed direction of critical care for this patient from another provider in my specialty: no     Care discussed with: admitting provider       Medications Ordered in ED Medications - No data to display  ED Course/ Medical Decision Making/ A&P                           Medical Decision Making Amount and/or Complexity of Data Reviewed Labs: ordered. Decision-making details documented in ED Course. Radiology: ordered and independent interpretation performed. Decision-making details  documented in ED Course. ECG/medicine tests: ordered and independent interpretation performed. Decision-making details documented in ED Course.  Risk OTC drugs. Decision regarding hospitalization.   Concern for recurrent and complicated UTI.  History of multidrug-resistant organisms in the past.  Subjective fever here with bodyaches and chills.  Septic workup pursued.  Patient initiated on IV fluids and IV antibiotics after cultures were obtained.  Given history of multidrug-resistant organisms will initiate meropenem  Concern for recurrent UTIs with history of urostomy and ESBL infection.  Febrile on arrival with infected appearing urine.  Code sepsis  activated   Patient given IV fluids and antibiotics after cultures were obtained. He does have diffuse abdominal pain and flank pain and history of kidney stones will need imaging to rule out infected kidney stone. Meropenem empirically started given his history of ESBL.  Urine culture from January 2023 grew Proteus as well as Klebsiella.  CT scan today shows chronic but improving hydronephrosis.  No obstructing stone seen.  Creatinine is at baseline.  Febrile in the ED 101.1.  No obstructing renal calculi.  Blood pressure mental status remained stable throughout ED course.  Given his sepsis with history of complicated UTIs and resistant bacteria will admit for IV antibiotics.  Discussed with Dr. [place]         Final Clinical Impression(s) / ED Diagnoses Final diagnoses:  Complicated UTI (urinary tract infection)    Rx / DC Orders ED Discharge Orders     None         Sony Schlarb, Annie Main, MD 10/09/21 2330

## 2021-10-09 NOTE — ED Triage Notes (Signed)
Pt states Kidney infection. Had fever and Hx of same. Feels same. Per family  has a conduit with no bladder.

## 2021-10-09 NOTE — Plan of Care (Signed)
Originating Facility: [facility] Requesting Physician/APP: Rancour, MD  History: Adrian Rios is an 81 year old male with past medical history significant for bladder cancer s/p ileal conduit and history of ESBL UTI who presented to [facility] with fever, chills, abdominal pain, flank pain and was found to have an elevated WBC count of 13.9; with concerns of recurrent UTI.  Lactic acid normal.  CT renal stone study with no nephrolithiasis with improved chronic bilateral hydroureteronephrosis.  Patient received meropenem, 1 L NS bolus, Tylenol.  Urinalysis and urine culture ordered.  Plan of Care:  -- Admit to observation, med/surg; Adrian Rios  [facility] will assume care on arrival to accepting facility. Until arrival, care as per EDP. However, TRH available 24/7 for questions and assistance.   Please page [place] and Consults [phone]) as soon as the patient arrives to the hospital.   Shery [place] DO

## 2021-10-09 NOTE — ED Notes (Signed)
EDP Rancour made aware of pts elevated temp.

## 2021-10-09 NOTE — ED Notes (Signed)
Pt transported to CT ?

## 2021-10-09 NOTE — Progress Notes (Signed)
Pharmacy Antibiotic Note  Adrian Rios is a 81 y.o. male for which pharmacy has been consulted for meropenem dosing for UTI.  Patient with a history of bladder cancer s/p cystectomy, recurrent UTIs/pyelonephritis, sarcoidosis, chronic bilateral hydronephrosis, ESBL UTI in April 2023. Patient presenting with flank pain.  SCr 1.16  WBC 13.9; LA 1.1; T 98.8 F; HR 100; RR 20  Plan: Meropenem 1g q12h Trend WBC, Fever, Renal function, & Clinical course F/u cultures, clinical course, WBC, fever De-escalate when able  Height: '5\' 7"'$  (170.2 cm) Weight: 78 kg (172 lb) IBW/kg (Calculated) : 66.1  Temp (24hrs), Avg:98.8 F (37.1 C), Min:98.8 F (37.1 C), Max:98.8 F (37.1 C)  No results for input(s): "WBC", "CREATININE", "LATICACIDVEN", "VANCOTROUGH", "VANCOPEAK", "VANCORANDOM", "GENTTROUGH", "GENTPEAK", "GENTRANDOM", "TOBRATROUGH", "TOBRAPEAK", "TOBRARND", "AMIKACINPEAK", "AMIKACINTROU", "AMIKACIN" in the last 168 hours.  CrCl cannot be calculated (Patient's most recent lab result is older than the maximum 21 days allowed.).    Allergies  Allergen Reactions   Ivp Dye [Iodinated Contrast Media] Shortness Of Breath, Itching and Palpitations    "eyes itching,  Heart racing,  Effected breathing" 10-27-16 pt with 13 hr pre-meds for CT without any reaction-kj   Metrizamide Shortness Of Breath, Itching and Palpitations     "eyes itching,  Heart racing,  Effected breathing" 10-27-16 pt with 13 hr pre-meds for CT without any reaction-kj   Shellfish Allergy Hives, Shortness Of Breath and Itching    Mostly crab   Gabapentin Other (See Comments)    REACTION: dizziness and flushing   Niacin-Lovastatin Er Other (See Comments)    Did not feel good on medication   Gadolinium Hives   Adhesive [Tape] Rash    Use paper tape    Antimicrobials this admission: Meropenem 8/10 >>   Microbiology results: Pending  Thank you for allowing pharmacy to be a part of this patient's care.  Lorelei Pont,  PharmD, BCPS 10/09/2021 9:00 PM ED Clinical Pharmacist -  [phone]

## 2021-10-10 DIAGNOSIS — J4520 Mild intermittent asthma, uncomplicated: Secondary | ICD-10-CM | POA: Diagnosis not present

## 2021-10-10 DIAGNOSIS — A4151 Sepsis due to Escherichia coli [E. coli]: Secondary | ICD-10-CM | POA: Diagnosis not present

## 2021-10-10 DIAGNOSIS — J45909 Unspecified asthma, uncomplicated: Secondary | ICD-10-CM | POA: Diagnosis not present

## 2021-10-10 DIAGNOSIS — Z8551 Personal history of malignant neoplasm of bladder: Secondary | ICD-10-CM

## 2021-10-10 DIAGNOSIS — Z8546 Personal history of malignant neoplasm of prostate: Secondary | ICD-10-CM | POA: Diagnosis not present

## 2021-10-10 DIAGNOSIS — N390 Urinary tract infection, site not specified: Secondary | ICD-10-CM

## 2021-10-10 DIAGNOSIS — Z85828 Personal history of other malignant neoplasm of skin: Secondary | ICD-10-CM | POA: Diagnosis not present

## 2021-10-10 DIAGNOSIS — A419 Sepsis, unspecified organism: Secondary | ICD-10-CM | POA: Diagnosis not present

## 2021-10-10 DIAGNOSIS — F3289 Other specified depressive episodes: Secondary | ICD-10-CM | POA: Diagnosis not present

## 2021-10-10 DIAGNOSIS — Z8616 Personal history of COVID-19: Secondary | ICD-10-CM | POA: Diagnosis not present

## 2021-10-10 DIAGNOSIS — R109 Unspecified abdominal pain: Secondary | ICD-10-CM | POA: Diagnosis present

## 2021-10-10 DIAGNOSIS — Z79899 Other long term (current) drug therapy: Secondary | ICD-10-CM | POA: Diagnosis not present

## 2021-10-10 LAB — BASIC METABOLIC PANEL
Anion gap: 5 (ref 5–15)
BUN: 15 mg/dL (ref 8–23)
CO2: 22 mmol/L (ref 22–32)
Calcium: 8 mg/dL — ABNORMAL LOW (ref 8.9–10.3)
Chloride: 115 mmol/L — ABNORMAL HIGH (ref 98–111)
Creatinine, Ser: 1.17 mg/dL (ref 0.61–1.24)
GFR, Estimated: >60 mL/min (ref >60)
Glucose, Bld: 109 mg/dL — ABNORMAL HIGH (ref 70–99)
Potassium: 3.5 mmol/L (ref 3.5–5.1)
Sodium: 142 mmol/L (ref 135–145)

## 2021-10-10 LAB — CBC
HCT: 36.5 % — ABNORMAL LOW (ref 39.0–52.0)
Hemoglobin: 11.7 g/dL — ABNORMAL LOW (ref 13.0–17.0)
MCH: 30.2 pg (ref 26.0–34.0)
MCHC: 32.1 g/dL (ref 30.0–36.0)
MCV: 94.1 fL (ref 80.0–100.0)
Platelets: 300 10*3/uL (ref 150–400)
RBC: 3.88 MIL/uL — ABNORMAL LOW (ref 4.22–5.81)
RDW: 13.2 % (ref 11.5–15.5)
WBC: 11.3 10*3/uL — ABNORMAL HIGH (ref 4.0–10.5)
nRBC: 0 % (ref 0.0–0.2)

## 2021-10-10 LAB — MAGNESIUM: Magnesium: 2.1 mg/dL (ref 1.7–2.4)

## 2021-10-10 MED ORDER — ACETAMINOPHEN 325 MG PO TABS
650.0000 mg | ORAL_TABLET | Freq: Four times a day (QID) | ORAL | Status: DC | PRN
  Administered 2021-10-10 – 2021-10-11 (×3): 650 mg via ORAL
  Filled 2021-10-10 (×3): qty 2

## 2021-10-10 MED ORDER — IBUPROFEN 400 MG PO TABS: 600.0000 mg | ORAL_TABLET | Freq: Every day | ORAL | Status: DC | PRN

## 2021-10-10 MED ORDER — MAGNESIUM OXIDE 400 MG PO TABS
400.0000 mg | ORAL_TABLET | Freq: Every day | ORAL | Status: DC
  Administered 2021-10-10: 400 mg via ORAL
  Filled 2021-10-10: qty 1

## 2021-10-10 MED ORDER — VENLAFAXINE HCL 37.5 MG PO TABS
112.5000 mg | ORAL_TABLET | Freq: Every day | ORAL | Status: DC
  Administered 2021-10-10 – 2021-10-11 (×2): 112.5 mg via ORAL
  Filled 2021-10-10 (×2): qty 1

## 2021-10-10 MED ORDER — METHENAMINE MANDELATE 0.5 G PO TABS
1000.0000 mg | ORAL_TABLET | Freq: Two times a day (BID) | ORAL | Status: DC
  Administered 2021-10-10 – 2021-10-11 (×2): 1000 mg via ORAL
  Filled 2021-10-10 (×2): qty 2

## 2021-10-10 MED ORDER — MONTELUKAST SODIUM 10 MG PO TABS
10.0000 mg | ORAL_TABLET | Freq: Every day | ORAL | Status: DC
Start: 2021-10-10 — End: 2021-10-11
  Administered 2021-10-10: 10 mg via ORAL
  Filled 2021-10-10: qty 1

## 2021-10-10 MED ORDER — SODIUM CHLORIDE 0.9 % IV SOLN
1.0000 g | Freq: Once | INTRAVENOUS | Status: AC
  Administered 2021-10-11: 1000 mg via INTRAVENOUS
  Filled 2021-10-10: qty 1

## 2021-10-10 MED ORDER — FOSFOMYCIN TROMETHAMINE 3 G PO PACK: 3.0000 g | PACK | ORAL | 0 refills | Status: DC

## 2021-10-10 MED ORDER — SODIUM CHLORIDE 0.9 % IV SOLN: INTRAVENOUS | Status: DC

## 2021-10-10 MED ORDER — FLUTICASONE PROPIONATE 50 MCG/ACT NA SUSP
2.0000 | Freq: Every day | NASAL | Status: DC
  Administered 2021-10-10: 2 via NASAL
  Filled 2021-10-10: qty 16

## 2021-10-10 MED ORDER — ENOXAPARIN SODIUM 40 MG/0.4ML IJ SOSY
40.0000 mg | PREFILLED_SYRINGE | INTRAMUSCULAR | Status: DC
  Administered 2021-10-10 – 2021-10-11 (×2): 40 mg via SUBCUTANEOUS
  Filled 2021-10-10 (×2): qty 0.4

## 2021-10-10 MED ORDER — METHENAMINE HIPPURATE 1 G PO TABS: 1.0000 g | ORAL_TABLET | Freq: Two times a day (BID) | ORAL | Status: DC

## 2021-10-10 MED ORDER — TRIAMCINOLONE ACETONIDE 55 MCG/ACT NA AERO: 2.0000 | INHALATION_SPRAY | Freq: Every day | NASAL | Status: DC

## 2021-10-10 NOTE — Progress Notes (Signed)
Transition of Care [facility]) Screening Note  Patient Details  Name: Adrian Rios Date of Birth: 12/13/1940  Transition of Care [facility]) CM/SW Contact:    Sherie Don, LCSW Phone Number: 10/10/2021, 10:11 AM  Transition of Care Department [facility]) has reviewed patient and no TOC needs have been identified at this time. We will continue to monitor patient advancement through interdisciplinary progression rounds. If new patient transition needs arise, please place a TOC consult.

## 2021-10-10 NOTE — Plan of Care (Signed)
  Problem: Education: Goal: Knowledge of General Education information will improve Description: Including pain rating scale, medication(s)/side effects and non-pharmacologic comfort measures Outcome: Progressing   Problem: Pain Managment: Goal: General experience of comfort will improve Outcome: Progressing   

## 2021-10-10 NOTE — Assessment & Plan Note (Signed)
S/p cystoprostatectomy at [facility] in 06/2026.

## 2021-10-10 NOTE — Progress Notes (Signed)
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  On presentation he was febrile with temperature of 101.1 F.  Lab work showed leukocytosis.  UA was positive for nitrite, small leukocyte.  Lactate was normal.  CT renal study showed chronic but improved bilateral ureteral hydronephrosis.  Since patient had history of previous ESBL E. coli, started on meropenem, blood cultures, urine culture sent.ID also consulted today.  Assessment and plan:  Sepsis secondary to complicated UTI/ESBL UTI: Presented with fever, leukocytosis.  On prophylactic methenamine and follows with [facility] urology/ID.  Started on meropenem empirically, sent blood cultures, urine cultures.  Leukocytosis improving.  This morning patient is afebrile. ID also following  History of prostate cancer: Status post cystoprostatectomy at [facility] on 4/28.  Follows with urology.  Also also has ileal conduit.  CT renal studies showed stable bilateral hydronephrosis  History of asthma: Currently not in exacerbation.  On presentation he was saturating at 88% on room air.  CT  scan showed right greater than left lower lobe atelectasis.  Now on room air  History of depression: Continue home medications

## 2021-10-10 NOTE — Assessment & Plan Note (Signed)
S/p robotic cystoprostatectomy and ileal conduit in 05/2016.  Followed at [facility].   -CT renal stone study with stable bilateral hydronephrosis.

## 2021-10-10 NOTE — H&P (Signed)
History and Physical    Patient: Adrian Rios ZOX:[id number] DOB: September 12, 1940 DOA: 10/09/2021 DOS: the patient was seen and examined on 10/10/2021 PCP: [name], MD  Patient coming from: [place] ED  Chief Complaint:  Chief Complaint  Patient presents with   Flank Pain   HPI: Adrian Rios is a 81 y.o. male with medical history significant of prostate and bladder cancer s/p cystoprostatectomy and ileal conduit in 2018, recurrent UTI with ESBL, asthma and depression presents with fevers, abdominal and flank pain.   He woke up around 3 AM and had diffuse abdominal and flank pain.  He took a Tylenol and went back to bed but continued to feel worse when he woke up this morning and decided to present to the ED.  In the ED, he was febrile up to 101.36F with leukocytosis of 13.9 K.  UA was positive for nitrite, small leukocyte and few bacteria. Lactate 1.1.   CT renal study showed chronic but improved bilateral hydroureteronephrosis.  Ureters are dilated to the ileal conduit.  Nonobstructing right renal stone.  He was started on IV meropenem with blood and urine culture pending and transfer request was made here to [facility].  Review of Systems: As mentioned in the history of present illness. All other systems reviewed and are negative. 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    s/p  MOH's nasal --   pre-melanoma   History of squamous cell carcinoma excision    several times   Hyperlipidemia    Pre-diabetes    Premature ventricular contractions (PVCs) (VPCs)    RAD (reactive airway disease)    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  s/p  deep neck lymph node bx's and lung bx's   Sensorineural hearing loss (SNHL) of both ears    Sepsis ([place]) 09/2016   UTI (urinary tract infection) 08/2016   Wears glasses    Wears hearing aid    bilateral   Past Surgical History:  Procedure Laterality Date   APPENDECTOMY  2008   CARDIOVASCULAR STRESS TEST  05/27/2010   normal nuclear study w/ no ischemia/  normal LV function and wall motion , ef 60%   CYSTOSCOPY WITH INJECTION N/A 06/12/2016   Procedure: CYSTOSCOPY WITH INJECTION OF INDOCYANINE GREEN DYE;  Surgeon: Alexis Frock, MD;  Location: [facility];  Service: Urology;  Laterality: N/A;   DEEP NECK LYMPH NODE BIOPSY / EXCISION  1970's   and Bronchoscopy w/ bx's ( dx Sarcoidosis)   ILEOSTOMY     IR NEPHROSTOMY PLACEMENT LEFT  11/18/2016   MOHS SURGERY  2013 approx.    nasal; pre melanoma   PARS PLANA VITRECTOMY Right 2007   repair macular pucker   TONSILLECTOMY  AND ADENOIDECTOMY  child   TRANSURETHRAL RESECTION OF BLADDER TUMOR N/A 04/06/2016   Procedure: CYSTOSCOPY TRANSURETHRAL RESECTION OF BLADDER TUMORS (TURBT);  Surgeon: Carolan Clines, MD;  Location: [facility];  Service: Urology;  Laterality: N/A;   TRANSURETHRAL RESECTION OF PROSTATE     Social History:  reports that he has never smoked. He has never used smokeless tobacco. He reports that he does not drink alcohol and does not use drugs.  Allergies  Allergen Reactions   Ivp Dye [Iodinated Contrast Media] Shortness Of Breath, Itching and Palpitations    "eyes itching,  Heart racing,  Effected breathing" 10-27-16 pt with 13 hr pre-meds for CT without any reaction-kj   Metrizamide Shortness Of Breath, Itching and Palpitations      "eyes itching,  Heart racing,  Effected breathing" 10-27-16 pt with 13 hr pre-meds for CT without any reaction-kj   Shellfish Allergy Hives, Shortness Of Breath and Itching    Mostly crab   Gabapentin Other (See Comments)    REACTION: dizziness and flushing   Niacin-Lovastatin Er Other (See Comments)    Did not feel good on medication   Gadolinium Hives   Adhesive [Tape] Rash    Use paper tape    Family History  Problem Relation Age of Onset   Stroke Mother        in her 72s   Breast cancer Sister    Heart disease Maternal Grandmother 52       MI    Breast cancer Paternal Grandmother    Heart disease Paternal Grandfather 41       MI   Stroke Brother    Healthy Father    Diabetes Neg Hx     Prior to Admission medications   Medication Sig Start Date End Date Taking? Authorizing Provider  acetaminophen (TYLENOL) 500 MG tablet Take 1,000 mg by mouth every 6 (six) hours as needed for headache or fever (pain).    [provider]  ADVAIR DISKUS 250-50 MCG/ACT AEPB INHALE 1 PUFF INTO THE LUNGS EVERY 12 HOURS 07/01/21   Burns, Claudina Lick, MD  albuterol (VENTOLIN HFA) 108 (90 Base) MCG/ACT inhaler Inhale 2 puffs into the lungs every 6 (six) hours as needed for wheezing or shortness of breath. 02/01/20   [name], MD  fosfomycin (MONUROL) 3 g PACK Take 3 g by mouth every other day. 07/23/21   Drenda Freeze, MD  latanoprost (XALATAN) 0.005 % ophthalmic solution Place 1 drop into the right eye at bedtime. 05/06/20   [provider]  magnesium oxide (MAG-OX) 400 MG tablet Take 400 mg by mouth daily with supper.    [provider]  methenamine (HIPREX) 1 g tablet Take 1 tablet (1 g total) by mouth 2 (two) times daily with a meal. 06/21/21   Gonfa, Charlesetta Ivory, MD  Misc Natural Products (GLUCOSAMINE CHOND DOUBLE STR PO) Take 1 tablet by mouth 2 (two) times daily.    [provider]  montelukast (SINGULAIR) 10 MG tablet Take 1 tablet (10 mg total) by mouth at  bedtime. 01/27/21   [name], MD  triamcinolone (NASACORT) 55 MCG/ACT AERO nasal inhaler Place 1 spray into the nose at bedtime.    [provider]  venlafaxine (EFFEXOR) 75 MG tablet TAKE 1.5 TABLETS BY MOUTH DAILY 07/17/21   [name], MD    Physical Exam: Vitals:   10/09/21 2204 10/09/21 2254 10/09/21 2310 10/10/21 0046  BP:    121/80  Pulse:  98  Resp:   20 18  Temp: (!) 100.4 F (38 C) (!) 101.1 F (38.4 C)  99.7 F (37.6 C)  TempSrc: Oral Oral  Oral  SpO2:   97% 96%  Weight:      Height:       Constitutional: NAD, calm, comfortable well-appearing elderly gentleman appearing younger than stated age laying in bed Eyes: lids and conjunctivae normal ENMT: Mucous membranes are moist. Neck: normal, supple Respiratory: clear to auscultation bilaterally, no wheezing, no crackles. Normal respiratory effort. No accessory muscle use.  On 2 L via nasal cannula Cardiovascular: Regular rate and rhythm, no murmurs / rubs / gallops. No extremity edema. .  Abdomen: Soft, nondistended, nontender, urine bag in place with clear urine and normal viable stoma, bowel sounds positive.  Musculoskeletal: no clubbing / cyanosis. No joint deformity upper and lower extremities. Good ROM, no contractures. Normal muscle tone.  Skin: no rashes, lesions, ulcers.  Neurologic: CN 2-12 grossly intact.  Strength 5/5 in all 4.  Psychiatric: Normal judgment and insight. Alert and oriented x 3. Normal mood. Data Reviewed:  See HPI  Assessment and Plan: Sepsis due to complicated UTI and patient with history of ESBL UTI Pt presented with fever and leukocytosis with positive UA -He is on prophylactic methamine and follows at [facility] Urology and infectious disease -continue on IV meropenem pending UA and blood culture -continuous IV fluid overnight  History of prostate cancer S/p cystoprostatectomy at [facility] in 06/2026.  History of bladder cancer S/p robotic cystoprostatectomy and ileal  conduit in 05/2016.  Followed at [facility].   -CT renal stone study with stable bilateral hydronephrosis.     Asthma Pt had hypoxia into 88% but could be due to atelectasis and hx of sarcoidosis. Not in acute exacerbation. -Monitor and wean as tolerated  Depression Continue home meds pending med rec      Advance Care Planning:   Code Status: Full Code Full  Consults: none  Family Communication: None at bedside  Severity of Illness: The appropriate patient status for this patient is OBSERVATION. Observation status is judged to be reasonable and necessary in order to provide the required intensity of service to ensure the patient's safety. The patient's presenting symptoms, physical exam findings, and initial radiographic and laboratory data in the context of their medical condition is felt to place them at decreased risk for further clinical deterioration. Furthermore, it is anticipated that the patient will be medically stable for discharge from the hospital within 2 midnights of admission.   Author: Orene Desanctis, DO 10/10/2021 1:14 AM  For on call review [email or web address].

## 2021-10-10 NOTE — Assessment & Plan Note (Signed)
Pt had hypoxia into 88% but could be due to atelectasis and hx of sarcoidosis. Not in acute exacerbation. -Monitor and wean as tolerated

## 2021-10-10 NOTE — Assessment & Plan Note (Signed)
Continue home meds pending med rec 

## 2021-10-10 NOTE — Progress Notes (Signed)
Pt arrived to floor from [place] ED. Pt A&O x4. Denies c/o pain. Skin intact. Has urostomy to RLQ with cloudy urine in bag. VSS. Page admitting MD.Pt oriented to room and call bell.

## 2021-10-10 NOTE — Assessment & Plan Note (Addendum)
Pt presented with fever and leukocytosis with positive UA -He is on prophylactic methamine and follows at [facility] Urology and infectious disease -continue on IV meropenem pending UA and blood culture -continuous IV fluid overnight

## 2021-10-10 NOTE — Consult Note (Signed)
[name] for Infectious Disease    Date of Admission:  10/09/2021     Reason for Consult: UTI     Referring Physician: Dr Tawanna Solo  Current antibiotics: Meropenem  ASSESSMENT & RECCS:    81 y.o. male admitted with:  Patient presenting with sepsis due to recurrent UTI with history of prior Klebsiella and ESBL E coli in setting of ileal conduit surgery in 2018.  His cultures are pending.  He appears improved this morning on Meropenem and states he is feeling better.  They would like to discharge when reasonably safe to do so to avoid prolonged admission.  I think this will be okay and we discussed that if blood cultures turn positive, he would likely need to come back to the ED and they expressed understanding.    Will continue Meropenem today dosed for renal function.  Tomorrow, will give a dose of ertapenem and then he can be discharged.  Will then recommend fosfomycin on Sunday 8/13 and a 2nd dose 72 hours later to complete antibiotics for complicated UTI.   Will sign off please call as needed.     Active Problems:   Depression   Asthma   History of bladder cancer   History of prostate cancer   Sepsis due to complicated UTI and patient with history of ESBL UTI   MEDICATIONS:    Scheduled Meds:  enoxaparin (LOVENOX) injection  40 mg Subcutaneous Q24H   Continuous Infusions:  sodium chloride 75 mL/hr at 10/10/21 0131   meropenem (MERREM) IV 1 g (10/10/21 0954)   PRN Meds:.acetaminophen  HPI:    Adrian Rios is a 81 y.o. male with PMHx including prostate and bladder cancer status post cystoprostatectomy and ileal conduit in 2018, recurrent ESBL UTI presenting with fevers, flank and abdominal pain starting overnight prior to admission.  He went to PCP office and submitted a urine specimen where he was advised his urine looked infected.  He continued to feel worse and presented to ED.  Febrile in ED.  WBC up.  UA = pos nitrites, many bacteria.  Started on Meropenem.   CT renal study = chronic but improved bilateral hydroureteronephrosis.  Ureters are dilated to the ileal conduit.  Nonobstructing right renal stone.  Transferred to [facility].  Follows at [facility]. Seen by Dr Johnny Bridge last admission.  Seen by [facility] on 07/08/21 as well as ID at [facility] in February.  On prophy methamine.  Abx prophy was discouraged at both visits as to avoid further resistance. Consulted by [facility].  Tmax 101.1.  WBC improved.  Reports has been drinking more fluids lately to avoid UTI and has done well since last UTI episodes in April and May.    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  s/p  MOH's nasal --  pre-melanoma   History of squamous cell carcinoma excision    several times   Hyperlipidemia    Pre-diabetes    Premature ventricular contractions (PVCs) (VPCs)    RAD (reactive airway disease)    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  s/p  deep neck lymph node bx's and lung bx's   Sensorineural hearing loss (SNHL) of both ears    Sepsis ([place]) 09/2016   UTI (urinary tract infection) 08/2016   Wears glasses    Wears hearing aid    bilateral    Social History   Tobacco Use   Smoking status: Never   Smokeless tobacco: Never  Vaping Use   Vaping Use: Never used  Substance Use Topics   Alcohol use: No   Drug use: No    Family History  Problem Relation Age of Onset    Stroke Mother        in her 57s   Breast cancer Sister    Heart disease Maternal Grandmother 60       MI    Breast cancer Paternal Grandmother    Heart disease Paternal Grandfather 28       MI   Stroke Brother    Healthy Father    Diabetes Neg Hx     Allergies  Allergen Reactions   Ivp Dye [Iodinated Contrast Media] Hives, Shortness Of Breath, Itching and Palpitations    "eyes itching,  Heart racing,  Effected breathing" 10-27-16 pt with 13 hr pre-meds for CT without any reaction-kj   Metrizamide Shortness Of Breath, Itching and Palpitations     "eyes itching,  Heart racing,  Effected breathing" 10-27-16 pt with 13 hr pre-meds for CT without any reaction-kj   Shellfish Allergy Hives, Shortness Of Breath and Itching    Mostly crab   Gabapentin Other (See Comments)    dizziness and flushing   Niacin-Lovastatin Er Other (See Comments)    Did not feel good on medication   Gadolinium Hives   Adhesive [Tape] Itching and Rash    Use paper tape    Review of Systems  All other systems reviewed and are negative.  Except as otherwise noted above in HPI.   OBJECTIVE:   Blood pressure 133/74, pulse 96, temperature 97.8 F (36.6 C), temperature source Oral, resp. rate 18, height '5\' 7"'$  (1.702 m), weight 78 kg, SpO2 95 %. Body mass index is 26.94 kg/m.  Physical Exam Constitutional:      General: He is not in acute distress.    Appearance: Normal appearance.  HENT:     Head: Normocephalic and atraumatic.  Eyes:     Extraocular Movements: Extraocular movements intact.     Conjunctiva/sclera: Conjunctivae normal.  Pulmonary:     Effort: Pulmonary effort is normal. No respiratory distress.  Abdominal:     General: There is no distension.     Palpations: Abdomen is soft.  Musculoskeletal:        General: Normal range of motion.     Cervical back: Normal range of motion and neck supple.  Skin:    General: Skin is warm and dry.     Findings: No rash.  Neurological:      General: No focal deficit present.     Mental Status: He is alert and oriented to person, place, and time.  Psychiatric:        Mood and Affect: Mood normal.  Behavior: Behavior normal.      Lab Results: Lab Results  Component Value Date   WBC 11.3 (H) 10/10/2021   HGB 11.7 (L) 10/10/2021   HCT 36.5 (L) 10/10/2021   MCV 94.1 10/10/2021   PLT 300 10/10/2021    Lab Results  Component Value Date   NA 142 10/10/2021   K 3.5 10/10/2021   CO2 22 10/10/2021   GLUCOSE 109 (H) 10/10/2021   BUN 15 10/10/2021   CREATININE 1.17 10/10/2021   CALCIUM 8.0 (L) 10/10/2021   GFRNONAA >60 10/10/2021   GFRAA >60 11/15/2016    Lab Results  Component Value Date   ALT 13 10/09/2021   AST 17 10/09/2021   ALKPHOS 94 10/09/2021   BILITOT 0.5 10/09/2021    No results found for: "CRP"  No results found for: "ESRSEDRATE"  I have reviewed the micro and lab results in Epic.  Imaging: CT Renal Stone Study  Result Date: 10/09/2021 CLINICAL DATA:  Flank pain, kidney stone suspected Patient reports fever and history of kidney infection. Stick to me with spondylitic. EXAM: CT ABDOMEN AND PELVIS WITHOUT CONTRAST TECHNIQUE: Multidetector CT imaging of the abdomen and pelvis was performed following the standard protocol without IV contrast. RADIATION DOSE REDUCTION: This exam was performed according to the departmental dose-optimization program which includes automated exposure control, adjustment of the mA and/or kV according to patient size and/or use of iterative reconstruction technique. COMPARISON:  Most recent CT 07/23/2021 FINDINGS: Lower chest: Right greater than left lower lobe atelectasis. No pleural effusion. Hepatobiliary: No focal liver abnormality is seen. No gallstones, gallbladder wall thickening, or biliary dilatation. Pancreas: No ductal dilatation or inflammation. Spleen: Normal in size without focal abnormality. Adrenals/Urinary Tract: Normal adrenal glands. There is chronic  bilateral hydroureteronephrosis. This has improved from prior exam. The ureters are dilated to the ileal conduit. Conduit is nondistended. No peristomal hernia. There is no evidence of renal inflammation. Nonobstructing stone in the lower right kidney. Cortical scarring in the upper left kidney. Simple bilateral renal cysts. These need no further follow-up. Prior cystectomy. Stomach/Bowel: Unremarkable appearance of the stomach. No small bowel obstruction or inflammation. Enteric sutures in the distal ileum. Small bowel adjacent to the enteric sutures is patulous but noninflamed or obstructed. Moderate colonic diverticulosis. No focal diverticulitis or acute colonic inflammation. Prior appendectomy Vascular/Lymphatic: Moderate aortic atherosclerosis. No aneurysm. No bulky abdominopelvic adenopathy Reproductive: Prostatectomy. Other: No ascites, free air or focal fluid collection. Small fat containing left inguinal hernia. Musculoskeletal: Stable sclerotic focus within L4 vertebral body, dissolved vague sclerosis involving inferior L3 vertebral body multilevel degenerative disc disease and facet hypertrophy. No acute osseous findings. IMPRESSION: 1. Chronic but improved bilateral hydroureteronephrosis. The ureters are dilated to the ileal conduit. No evidence of renal inflammation. 2. Nonobstructing right renal stone. 3. Colonic diverticulosis without acute inflammation. Aortic Atherosclerosis (ICD10-I70.0). Electronically Signed   By: Keith Rake M.D.   On: 10/09/2021 21:53     Imaging independently reviewed in Epic.  Raynelle High[facility] for Infectious Disease [facility] [phone] pager 10/10/2021, 10:47 AM

## 2021-10-11 DIAGNOSIS — A419 Sepsis, unspecified organism: Secondary | ICD-10-CM | POA: Diagnosis not present

## 2021-10-11 DIAGNOSIS — A4151 Sepsis due to Escherichia coli [E. coli]: Secondary | ICD-10-CM | POA: Diagnosis not present

## 2021-10-11 LAB — CBC
HCT: 36.4 % — ABNORMAL LOW (ref 39.0–52.0)
Hemoglobin: 11.8 g/dL — ABNORMAL LOW (ref 13.0–17.0)
MCH: 30.5 pg (ref 26.0–34.0)
MCHC: 32.4 g/dL (ref 30.0–36.0)
MCV: 94.1 fL (ref 80.0–100.0)
Platelets: 290 10*3/uL (ref 150–400)
RBC: 3.87 MIL/uL — ABNORMAL LOW (ref 4.22–5.81)
RDW: 13.2 % (ref 11.5–15.5)
WBC: 10.5 10*3/uL (ref 4.0–10.5)
nRBC: 0 % (ref 0.0–0.2)

## 2021-10-11 LAB — URINE CULTURE: Culture: <10000 — AB

## 2021-10-11 LAB — BASIC METABOLIC PANEL
Anion gap: 5 (ref 5–15)
BUN: 13 mg/dL (ref 8–23)
CO2: 24 mmol/L (ref 22–32)
Calcium: 8.6 mg/dL — ABNORMAL LOW (ref 8.9–10.3)
Chloride: 114 mmol/L — ABNORMAL HIGH (ref 98–111)
Creatinine, Ser: 0.97 mg/dL (ref 0.61–1.24)
GFR, Estimated: >60 mL/min (ref >60)
Glucose, Bld: 106 mg/dL — ABNORMAL HIGH (ref 70–99)
Potassium: 3.8 mmol/L (ref 3.5–5.1)
Sodium: 143 mmol/L (ref 135–145)

## 2021-10-11 MED ORDER — FOSFOMYCIN TROMETHAMINE 3 G PO PACK: 3.0000 g | PACK | Freq: Once | ORAL | Status: DC

## 2021-10-11 MED ORDER — FOSFOMYCIN TROMETHAMINE 3 G PO PACK
3.0000 g | PACK | Freq: Once | ORAL | Status: DC
Start: 2021-10-15 — End: 2021-10-11

## 2021-10-11 NOTE — Progress Notes (Signed)
Reviewed written d/c instructions w pt and his wife and all questions answered. They both verbalized understanding. D/C via w/c w all belongings in stable condition. 

## 2021-10-11 NOTE — Discharge Summary (Signed)
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  On presentation he was febrile with temperature of 101.1 F.  Lab work showed leukocytosis.  UA was positive for nitrite, small leukocyte.  Lactate was normal.  CT renal study showed chronic but improved bilateral ureteral hydronephrosis.  Since patient had history of previous ESBL E. coli, started on meropenem, blood cultures, urine culture sent.ID also consulted.  Patient hospital course remarkable stable.  He was febrile during the hospital stay.  Blood culture did not show any growth, urine culture showed insignificant growth.  I did recommend 1 more dose of ertapenem on 8/12 and discharge with 2 doses of fosfomycin.  Medically stable for discharge home today.  Following problems were addressed during his hospitalization:   Sepsis secondary to complicated UTI/ESBL UTI: Presented with fever, leukocytosis.  On prophylactic methenamine and follows with [facility]/ID.  Started on meropenem empirically then changed to ertapenem, sent blood cultures, urine cultures.  Leukocytosis improved.  This morning patient is afebrile.  Urine culture showed insignificant growth.  He will be discharged on 2 doses of fosfomycin to be taken 3 days apart.   History of prostate cancer: Status post cystoprostatectomy at [facility] on 4/28.  Follows with urology.  Also also has ileal conduit.  CT renal studies showed stable bilateral hydronephrosis   History of asthma:  Currently not in exacerbation.  On presentation he was saturating at 88% on room air.  CT  scan showed right greater than left lower lobe atelectasis.  Now on room air   History of depression: Continue home medications     Discharge Diagnoses:  Active Problems:   Sepsis due to complicated UTI and patient with history of ESBL UTI   History of bladder cancer   History of prostate cancer   Depression   Asthma    Discharge Instructions  Discharge Instructions     Diet general   Complete by: As directed    Discharge instructions   Complete by: As directed    1)Please take prescribed medication as instructed 2)Follow up with your PCP in a week   Increase activity slowly   Complete by: As directed       Allergies as of 10/11/2021       Reactions   Ivp Dye [iodinated Contrast Media] Hives, Shortness Of Breath, Itching, Palpitations   "eyes itching,  Heart racing,  Effected breathing" 10-27-16 pt with 13 hr pre-meds for CT without any reaction-kj   Metrizamide Shortness Of Breath, Itching, Palpitations   "eyes itching,  Heart racing,  Effected breathing" 10-27-16 pt with 13 hr pre-meds for CT without any reaction-kj   Shellfish Allergy Hives, Shortness Of Breath, Itching   Mostly crab   Gabapentin Other (See Comments)   dizziness and flushing   Niacin-lovastatin Er Other (See Comments)   Did not feel good on medication   Gadolinium Hives   Adhesive [tape] Itching, Rash   Use paper tape        Medication List     STOP taking these medications    fluticasone 50  MCG/ACT nasal spray Commonly known as: FLONASE   triamcinolone cream 0.1 % Commonly known as: KENALOG       TAKE these medications    acetaminophen 500 MG tablet Commonly known as: TYLENOL Take 1,000 mg by mouth as needed for headache (pain).   Advair Diskus 250-50 MCG/ACT Aepb Generic drug: fluticasone-salmeterol INHALE 1 PUFF INTO THE LUNGS EVERY 12 HOURS What changed: See the new  instructions.   albuterol 108 (90 Base) MCG/ACT inhaler Commonly known as: VENTOLIN HFA Inhale 2 puffs into the lungs every 6 (six) hours as needed for wheezing or shortness of breath. What changed:  how much to take when to take this   fosfomycin 3 g Pack Commonly known as: MONUROL Take 3 g by mouth every 3 (three) days. Take one packet on Sunday 8/13 and one packet on Wednesday 8/16. Start taking on: October 12, 2021 What changed:  when to take this additional instructions   GLUCOSAMINE CHOND DOUBLE STR PO Take 1 tablet by mouth 2 (two) times daily.   ibuprofen 200 MG tablet Commonly known as: ADVIL Take 600 mg by mouth daily as needed for headache (pain).   latanoprost 0.005 % ophthalmic solution Commonly known as: XALATAN Place 1 drop into the right eye at bedtime.   magnesium oxide 400 MG tablet Commonly known as: MAG-OX Take 400 mg by mouth daily with supper.   methenamine 1 g tablet Commonly known as: HIPREX Take 1 tablet (1 g total) by mouth 2 (two) times daily with a meal.   montelukast 10 MG tablet Commonly known as: SINGULAIR Take 1 tablet (10 mg total) by mouth at bedtime.   triamcinolone 55 MCG/ACT Aero nasal inhaler Commonly known as: NASACORT Place 2 sprays into the nose at bedtime.   venlafaxine 75 MG tablet Commonly known as: EFFEXOR TAKE 1.5 TABLETS BY MOUTH DAILY What changed: See the new instructions.   vitamin C 1000 MG tablet Take 1,000 mg by mouth daily.        Follow-up Information     [name], MD. Schedule an appointment as soon as possible for a visit in 1 week(s).   Specialty: Internal Medicine Contact information: [place] [address] [phone]                Allergies  Allergen Reactions   Ivp Dye [Iodinated Contrast Media] Hives, Shortness Of Breath, Itching and Palpitations    "eyes itching,  Heart racing,  Effected breathing" 10-27-16 pt with 13 hr pre-meds for CT without any  reaction-kj   Metrizamide Shortness Of Breath, Itching and Palpitations     "eyes itching,  Heart racing,  Effected breathing" 10-27-16 pt with 13 hr pre-meds for CT without any reaction-kj   Shellfish Allergy Hives, Shortness Of Breath and Itching    Mostly crab   Gabapentin Other (See Comments)    dizziness and flushing   Niacin-Lovastatin Er Other (See Comments)    Did not feel good on medication   Gadolinium Hives   Adhesive [Tape] Itching and Rash    Use paper tape    Consultations: ID   Procedures/Studies: CT Renal Stone Study  Result Date: 10/09/2021 CLINICAL DATA:  Flank pain, kidney stone suspected Patient reports fever and history of kidney infection. Stick to me with spondylitic. EXAM: CT ABDOMEN AND PELVIS WITHOUT CONTRAST TECHNIQUE: Multidetector CT imaging of the abdomen and pelvis was performed following the standard protocol without IV contrast. RADIATION DOSE REDUCTION: This exam was performed according  to the departmental dose-optimization program which includes automated exposure control, adjustment of the mA and/or kV according to patient size and/or use of iterative reconstruction technique. COMPARISON:  Most recent CT 07/23/2021 FINDINGS: Lower chest: Right greater than left lower lobe atelectasis. No pleural effusion. Hepatobiliary: No focal liver abnormality is seen. No gallstones, gallbladder wall thickening, or biliary dilatation. Pancreas: No ductal dilatation or inflammation. Spleen: Normal in size without focal abnormality. Adrenals/Urinary Tract: Normal adrenal glands. There is chronic bilateral hydroureteronephrosis. This has improved from prior exam. The ureters are dilated to the ileal conduit. Conduit is nondistended. No peristomal hernia. There is no evidence of renal inflammation. Nonobstructing stone in the lower right kidney. Cortical scarring in the upper left kidney. Simple bilateral renal cysts. These need no further follow-up. Prior cystectomy.  Stomach/Bowel: Unremarkable appearance of the stomach. No small bowel obstruction or inflammation. Enteric sutures in the distal ileum. Small bowel adjacent to the enteric sutures is patulous but noninflamed or obstructed. Moderate colonic diverticulosis. No focal diverticulitis or acute colonic inflammation. Prior appendectomy Vascular/Lymphatic: Moderate aortic atherosclerosis. No aneurysm. No bulky abdominopelvic adenopathy Reproductive: Prostatectomy. Other: No ascites, free air or focal fluid collection. Small fat containing left inguinal hernia. Musculoskeletal: Stable sclerotic focus within L4 vertebral body, dissolved vague sclerosis involving inferior L3 vertebral body multilevel degenerative disc disease and facet hypertrophy. No acute osseous findings. IMPRESSION: 1. Chronic but improved bilateral hydroureteronephrosis. The ureters are dilated to the ileal conduit. No evidence of renal inflammation. 2. Nonobstructing right renal stone. 3. Colonic diverticulosis without acute inflammation. Aortic Atherosclerosis (ICD10-I70.0). Electronically Signed   By: Keith Rake M.D.   On: 10/09/2021 21:53      Subjective: Patient seen and examined at the bedside this morning hemodynamically stable.  Comfortable.  Afebrile without any complaints.  Eager to go home.  Discharge Exam: Vitals:   10/10/21 2223 10/11/21 0646  BP: 119/77 127/72  Pulse: 69 73  Resp: 18 18  Temp: 98.7 F (37.1 C) 98.4 F (36.9 C)  SpO2: 96% 93%   Vitals:   10/10/21 0833 10/10/21 1224 10/10/21 2223 10/11/21 0646  BP: 133/74 99/66 119/77 127/72  Pulse: 96 81 69 73  Resp: '18 18 18 18  '$ Temp: 97.8 F (36.6 C) 98.8 F (37.1 C) 98.7 F (37.1 C) 98.4 F (36.9 C)  TempSrc: Oral Oral Oral Oral  SpO2: 95% 95% 96% 93%  Weight:      Height:        General: Pt is alert, awake, not in acute distress Cardiovascular: RRR, S1/S2 +, no rubs, no gallops Respiratory: CTA bilaterally, no wheezing, no rhonchi Abdominal: Soft,  NT, ND, bowel sounds +, ileal conduit Extremities: no edema, no cyanosis    The results of significant diagnostics from this hospitalization (including imaging, microbiology, ancillary and laboratory) are listed below for reference.     Microbiology: Recent Results (from the past 240 hour(s))  Blood culture (routine x 2)     Status: None (Preliminary result)   Collection Time: 10/09/21  9:00 PM   Specimen: BLOOD  Result Value Ref Range Status   Specimen Description   Final    BLOOD RIGHT ANTECUBITAL Performed at [facility], [address] [place], [place] [address]    Special Requests   Final    BOTTLES DRAWN AEROBIC AND ANAEROBIC Blood Culture adequate volume Performed at [facility], [address] [place],  [address]    Culture   Final    NO GROWTH  2 DAYS Performed at [facility], [place] [address] [place], [place] [address]    Report Status PENDING  Incomplete  Blood culture (routine x 2)     Status: None (Preliminary result)   Collection Time: 10/09/21  9:06 PM   Specimen: BLOOD  Result Value Ref Range Status   Specimen Description   Final    BLOOD LEFT ANTECUBITAL Performed at [facility], [address] [place], [place] [address]    Special Requests   Final    BOTTLES DRAWN AEROBIC AND ANAEROBIC Blood Culture adequate volume Performed at [facility], [address] [place], [name] [address]    Culture   Final    NO GROWTH 2 DAYS Performed at [facility], [place] [address] [place], [place] [address]    Report Status PENDING  Incomplete  Urine Culture     Status: Abnormal   Collection Time: 10/09/21 10:49 PM   Specimen: Urine, Clean Catch  Result Value Ref Range Status   Specimen Description   Final    URINE, CLEAN CATCH Performed at [facility], [address] [place], [place] [address]    Special Requests   Final    NONE Performed at [facility], [address] [place], [name] [address]    Culture (A)  Final    <10,000 COLONIES/mL INSIGNIFICANT GROWTH Performed at [facility], [place] [address], [place], [place] [address]    Report Status 10/11/2021 FINAL  Final     Labs: BNP (last 3 results) No results for input(s): "BNP" in the last 8760 hours. Basic Metabolic Panel: Recent Labs  Lab 10/09/21 2030 10/10/21 0505 10/11/21 0420  NA 136 142 143  K 3.6 3.5 3.8  CL 108 115* 114*  CO2 20* 22 24  GLUCOSE 120* 109* 106*  BUN '18 15 13  '$ CREATININE 1.16 1.17 0.97  CALCIUM 8.4* 8.0* 8.6*  MG  --  2.1  --    Liver Function Tests: Recent Labs  Lab 10/09/21 2030  AST 17  ALT 13  ALKPHOS 94  BILITOT 0.5  PROT 7.2  ALBUMIN 3.5   Recent Labs  Lab 10/09/21 2030  LIPASE 35   No results for input(s): "AMMONIA" in the last 168 hours. CBC: Recent Labs  Lab 10/09/21 2030 10/10/21 0505 10/11/21 0420  WBC 13.9* 11.3* 10.5  NEUTROABS 10.9*  --   --   HGB 12.5* 11.7* 11.8*  HCT 37.5* 36.5* 36.4*  MCV 91.2 94.1 94.1  PLT 347 300 290   Cardiac Enzymes: No results for input(s): "CKTOTAL", "CKMB", "CKMBINDEX", "TROPONINI" in the last 168 hours. BNP: Invalid input(s): "POCBNP" CBG: No results for input(s): "GLUCAP" in the last 168 hours. D-Dimer No results for input(s): "DDIMER" in the last 72 hours. Hgb A1c No results for input(s): "HGBA1C" in the last 72 hours. Lipid Profile No results for input(s): "CHOL", "HDL", "LDLCALC", "TRIG", "CHOLHDL", "LDLDIRECT" in the last 72 hours. Thyroid function studies No results for input(s): "TSH", "T4TOTAL", "T3FREE", "THYROIDAB" in the last 72 hours.  Invalid input(s): "FREET3" Anemia work up No results for input(s): "VITAMINB12", "FOLATE", "FERRITIN", "TIBC", "IRON", "RETICCTPCT" in the last 72 hours. Urinalysis    Component Value Date/Time   COLORURINE YELLOW 10/09/2021 2249   APPEARANCEUR CLEAR 10/09/2021 2249   LABSPEC 1.015 10/09/2021 2249   PHURINE 6.5 10/09/2021 2249    GLUCOSEU NEGATIVE 10/09/2021 2249   GLUCOSEU NEGATIVE 10/09/2021  [place] (A) 10/09/2021 2249   HGBUR negative 07/11/2009 0813   BILIRUBINUR NEGATIVE 10/09/2021 2249   KETONESUR NEGATIVE 10/09/2021 2249   PROTEINUR NEGATIVE 10/09/2021 2249   UROBILINOGEN 0.2 10/09/2021 1626   NITRITE POSITIVE (A) 10/09/2021 2249   LEUKOCYTESUR SMALL (A) 10/09/2021 2249   Sepsis Labs Recent Labs  Lab 10/09/21 2030 10/10/21 0505 10/11/21 0420  WBC 13.9* 11.3* 10.5   Microbiology Recent Results (from the past 240 hour(s))  Blood culture (routine x 2)     Status: None (Preliminary result)   Collection Time: 10/09/21  9:00 PM   Specimen: BLOOD  Result Value Ref Range Status   Specimen Description   Final    BLOOD RIGHT ANTECUBITAL Performed at [facility], [address] [place], [place] [address]    Special Requests   Final    BOTTLES DRAWN AEROBIC AND ANAEROBIC Blood Culture adequate volume Performed at [facility], [address] [place], [name] [address]    Culture   Final    NO GROWTH 2 DAYS Performed at [facility], [place] [address], [place], [place] [address]    Report Status PENDING  Incomplete  Blood culture (routine x 2)     Status: None (Preliminary result)   Collection Time: 10/09/21  9:06 PM   Specimen: BLOOD  Result Value Ref Range Status   Specimen Description   Final    BLOOD LEFT ANTECUBITAL Performed at [facility], [address] [place], [place] [address]    Special Requests   Final    BOTTLES DRAWN AEROBIC AND ANAEROBIC Blood Culture adequate volume Performed at [facility], [address] [place], [name] [address]    Culture   Final    NO GROWTH 2 DAYS Performed at [facility], [place] [address], [place], [place] [address]    Report Status PENDING  Incomplete  Urine Culture     Status: Abnormal   Collection Time: 10/09/21 10:49 PM   Specimen: Urine, Clean Catch  Result Value Ref Range  Status   Specimen Description   Final    URINE, CLEAN CATCH Performed at [facility], [address] [place], [place] [address]    Special Requests   Final    NONE Performed at [facility], [address] [place], [name] [address]    Culture (A)  Final    <10,000 COLONIES/mL INSIGNIFICANT GROWTH Performed at [facility], [place] [address], [place],  [address]    Report Status 10/11/2021 FINAL  Final    Please note: You were cared for by a hospitalist during your hospital stay. Once you are discharged, your primary care physician will handle any further medical issues. Please note that NO REFILLS for any discharge medications will be authorized once you are discharged, as it is imperative that you return to your primary care physician (or establish a relationship with a primary care physician if you do not have one) for your post hospital discharge needs so that they can reassess your need for medications and monitor your lab values.    Time coordinating discharge: 40 minutes  SIGNED:   Shelly Coss, MD  [facility] 10/11/2021, 10:41 AM Pager [phone]  If 7PM-7AM, please contact night-coverage [email or web address] Password [email or web address]

## 2021-10-13 ENCOUNTER — Encounter: Payer: Self-pay | Admitting: Internal Medicine

## 2021-10-14 LAB — CULTURE, BLOOD (ROUTINE X 2)
Culture: NO GROWTH
Special Requests: ADEQUATE

## 2021-10-14 NOTE — Progress Notes (Incomplete)
Received a page from pharmacy tonight regarding one anaerobic bottle being positive for gram +rods. Pt was just d/c on 10/11/21 for ESBL UTI. I attempted his home phone, cellphone and his wife's phone number in the chart with no answer.  I left voicemail advising them to present to ED for evaluation. Unclear of pt's current symptoms therefore hard to know whether it is likely contaminant.

## 2021-10-14 NOTE — Progress Notes (Signed)
Received a page from pharmacy tonight regarding one anaerobic bottle being positive for gram +rods. Pt was just d/c on 10/11/21 for ESBL UTI.  Made wife aware that this is most likely a contaminant and for pt to continue follow up with PCP. She understands and reports that he has been improving since discharge.

## 2021-10-16 LAB — CULTURE, BLOOD (ROUTINE X 2): Special Requests: ADEQUATE

## 2021-10-21 NOTE — Progress Notes (Unsigned)
Subjective:    Patient ID: Adrian Rios, male    DOB: May 01, 1940, 81 y.o.   MRN: [id number]     HPI Adrian Rios is here for follow up from the hospital.  Admitted 8/11 - 8/12 for UTI with sepsis  Presented to the ED with flank pain, abdominal pain and fevers.  He has a significant history of prostate and bladder cancer s/p cystoprostatectomy with ileal conduit in 2018, recurrent UTI resulting in multiple hospitalizations.  He woke up that morning with the symptoms.  The symptoms progressively worsened throughout the day.  He did have a UA here in the office that confirmed UTI.  In the ED, fever 101.15F, leukocytosis 13.9 K, UA positive for nitrate, leukocyte and few bacteria, lactate 1.1.  CT renal study showed chronic but improved bilateral hydroureteronephrosis.  Ureters were dilated due to the ileal conduit.  Nonobstructing right renal stone.  Started on IV meropenem.  This was later changed to ertapenem.  ID consulted.  Blood cultures were negative.  Urine culture showed insignificant growth.  Clinically improved.  He was discharged home with 2 doses of fosfomycin.  At home he did experience diarrhea.  That has resolved.  He does follow with [facility] and infectious disease.  Wonders about local infectious disease doctor.  Medications and allergies reviewed with patient and updated if appropriate.  Current Outpatient Medications on File Prior to Visit  Medication Sig Dispense Refill   acetaminophen (TYLENOL) 500 MG tablet Take 1,000 mg by mouth as needed for headache (pain).     ADVAIR DISKUS 250-50 MCG/ACT AEPB INHALE 1 PUFF INTO THE LUNGS EVERY 12 HOURS (Patient taking differently: Inhale 1 puff into the lungs daily.) 60 each 2   albuterol (VENTOLIN HFA) 108 (90 Base) MCG/ACT inhaler Inhale 2 puffs into the lungs every 6 (six) hours as needed for wheezing or shortness of breath. (Patient taking differently: Inhale 1-2 puffs into the lungs as needed for wheezing or shortness of  breath.) 1 each 8   Ascorbic Acid (VITAMIN C) 1000 MG tablet Take 1,000 mg by mouth daily.     fosfomycin (MONUROL) 3 g PACK Take 3 g by mouth every 3 (three) days. Take one packet on Sunday 8/13 and one packet on Wednesday 8/16. 6 g 0   ibuprofen (ADVIL) 200 MG tablet Take 600 mg by mouth daily as needed for headache (pain).     latanoprost (XALATAN) 0.005 % ophthalmic solution Place 1 drop into the right eye at bedtime.     magnesium oxide (MAG-OX) 400 MG tablet Take 400 mg by mouth daily with supper.     methenamine (HIPREX) 1 g tablet Take 1 tablet (1 g total) by mouth 2 (two) times daily with a meal.     Misc Natural Products (GLUCOSAMINE CHOND DOUBLE STR PO) Take 1 tablet by mouth 2 (two) times daily.     montelukast (SINGULAIR) 10 MG tablet Take 1 tablet (10 mg total) by mouth at bedtime. 90 tablet 2   triamcinolone (NASACORT) 55 MCG/ACT AERO nasal inhaler Place 2 sprays into the nose at bedtime.     venlafaxine (EFFEXOR) 75 MG tablet TAKE 1.5 TABLETS BY MOUTH DAILY (Patient taking differently: Take 112.5 mg by mouth daily.) 135 tablet 1   No current facility-administered medications on file prior to visit.     Review of Systems     Objective:  There were no vitals filed for this visit. BP Readings from Last 3 Encounters:  10/11/21  127/72  07/23/21 139/82  06/25/21 108/72   Wt Readings from Last 3 Encounters:  10/09/21 172 lb (78 kg)  07/23/21 172 lb (78 kg)  06/25/21 172 lb (78 kg)   There is no height or weight on file to calculate BMI.    Physical Exam     Lab Results  Component Value Date   WBC 10.5 10/11/2021   HGB 11.8 (L) 10/11/2021   HCT 36.4 (L) 10/11/2021   PLT 290 10/11/2021   GLUCOSE 106 (H) 10/11/2021   CHOL 146 08/02/2020   TRIG 130.0 08/02/2020   HDL 41.50 08/02/2020   LDLCALC 79 08/02/2020   ALT 13 10/09/2021   AST 17 10/09/2021   NA 143 10/11/2021   K 3.8 10/11/2021   CL 114 (H) 10/11/2021   CREATININE 0.97 10/11/2021   BUN 13 10/11/2021    CO2 24 10/11/2021   TSH 1.17 10/28/2018   PSA 0.29 07/11/2009   INR 1.1 06/13/2021   HGBA1C 5.7 06/25/2021   MICROALBUR 0.3 06/29/2006     Assessment & Plan:    See Problem List for Assessment and Plan of chronic medical problems.

## 2021-10-22 ENCOUNTER — Ambulatory Visit (INDEPENDENT_AMBULATORY_CARE_PROVIDER_SITE_OTHER): Payer: Medicare Other | Admitting: Internal Medicine

## 2021-10-22 ENCOUNTER — Encounter: Payer: Self-pay | Admitting: Internal Medicine

## 2021-10-22 VITALS — BP 112/60 | HR 80 | Temp 98.0°F | Ht 67.0 in | Wt 177.0 lb

## 2021-10-22 DIAGNOSIS — F3289 Other specified depressive episodes: Secondary | ICD-10-CM | POA: Diagnosis not present

## 2021-10-22 DIAGNOSIS — N390 Urinary tract infection, site not specified: Secondary | ICD-10-CM | POA: Diagnosis not present

## 2021-10-22 NOTE — Assessment & Plan Note (Signed)
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 Juleen China with infectious disease to establish care-referral ordered

## 2021-10-22 NOTE — Assessment & Plan Note (Signed)
Recent brief hospitalization for complicated UTI was Referral ordered for Dr. Juleen China

## 2021-10-22 NOTE — Assessment & Plan Note (Signed)
Chronic Controlled, Stable Continue Effexor 112.5 mg daily

## 2021-10-22 NOTE — Patient Instructions (Signed)
    A referral was ordered for Dr Juleen China.     Someone from that office will call you to schedule an appointment.

## 2021-10-30 ENCOUNTER — Other Ambulatory Visit: Payer: Self-pay | Admitting: Internal Medicine

## 2021-11-07 ENCOUNTER — Ambulatory Visit: Payer: Medicare Other

## 2021-11-11 ENCOUNTER — Ambulatory Visit (INDEPENDENT_AMBULATORY_CARE_PROVIDER_SITE_OTHER): Payer: Medicare Other | Admitting: Internal Medicine

## 2021-11-11 ENCOUNTER — Other Ambulatory Visit: Payer: Self-pay

## 2021-11-11 ENCOUNTER — Encounter: Payer: Self-pay | Admitting: Internal Medicine

## 2021-11-11 VITALS — BP 120/77 | HR 87 | Temp 97.5°F | Wt 178.0 lb

## 2021-11-11 DIAGNOSIS — N390 Urinary tract infection, site not specified: Secondary | ICD-10-CM | POA: Diagnosis not present

## 2021-11-11 MED ORDER — FOSFOMYCIN TROMETHAMINE 3 G PO PACK: 3.0000 g | PACK | Freq: Once | ORAL | 0 refills | Status: AC

## 2021-11-11 NOTE — Progress Notes (Signed)
[facility]  Reason for Consult: Recurrent UTI  Referring Provider: Dr Billey Gosling   HPI:    Adrian Rios is a 81 y.o. male with PMHx as below who presents to the clinic for recurrent UTI.   Patient is here today as a new consultation for recurrent urinary tract infections.  He was referred by his primary care physician, Dr. Billey Gosling.  Patient was admitted at [facility] last month on August 10 for a urinary tract infection in the setting of a history of ESBL.  He presented with fever and leukocytosis.  He was treated with carbapenem therapy.  He clinically improved.  He subsequently was discharged on fosfomycin x2 doses.  I saw him during that hospitalization.  He had a follow-up appointment with [name] on 8/23.  At that visit he was doing well with no concerning symptoms to indicate a recurrent infection.  He asked for referral to a local ID doctor here and presents today for follow-up.  Patient has also followed with ID previously at [facility] in May 2023 as well as [facility] in February 2023.  He is on prophylactic methenamine.  During these ID visits, antibiotic prophylaxis was discouraged to avoid further resistance.  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  Prior to this most recent hospitalization he had a urinalysis obtained by his PCP 10/09/21 which showed positive nitrites, pyuria, and many bacteria with urine culture yielding greater than 100,000 colonies of Klebsiella pneumonia with retained susceptibility to many antibiotic options.  Patient's Medications  New Prescriptions   FOSFOMYCIN (MONUROL) 3 G PACK    Take 3 g by mouth once for 1 dose.  Previous Medications   ACETAMINOPHEN (TYLENOL) 500 MG TABLET    Take 1,000 mg by mouth as needed for headache (pain).   ADVAIR  DISKUS 250-50 MCG/ACT AEPB    INHALE 1 PUFF INTO THE LUNGS EVERY 12 HOURS   ALBUTEROL (VENTOLIN HFA) 108 (90 BASE) MCG/ACT INHALER    Inhale 2 puffs into the lungs every 6 (six) hours as needed for wheezing or shortness of breath.   ASCORBIC ACID (VITAMIN C) 1000 MG TABLET    Take 1,000 mg by mouth daily.   IBUPROFEN (ADVIL) 200 MG TABLET    Take 600 mg by mouth daily as needed for headache (pain).   LATANOPROST (XALATAN) 0.005 % OPHTHALMIC SOLUTION    Place 1 drop into the right eye at bedtime.   MAGNESIUM OXIDE (MAG-OX) 400 MG TABLET    Take 400 mg by mouth daily with supper.   METHENAMINE (HIPREX) 1 G TABLET    Take 1 tablet (1 g total) by mouth 2 (two) times daily with a meal.   MISC NATURAL PRODUCTS (GLUCOSAMINE CHOND DOUBLE STR PO)    Take 1 tablet by mouth 2 (two) times daily.   MONTELUKAST (SINGULAIR) 10 MG TABLET    TAKE ONE TABLET BY MOUTH EVERY NIGHT AT BEDTIME   TRIAMCINOLONE (NASACORT) 55 MCG/ACT AERO NASAL INHALER    Place 2 sprays into the nose at bedtime.   VENLAFAXINE (EFFEXOR) 75 MG TABLET    TAKE 1.5 TABLETS BY MOUTH DAILY  Modified Medications   No medications on file  Discontinued Medications   No medications on file      Past Medical History:  Diagnosis Date  Allergic rhinitis    Arthritis    Benign localized prostatic hyperplasia with lower urinary tract symptoms (LUTS)    Bladder tumor    Borderline glaucoma of right eye    Cancer ([place])    bladder and prostate   Carotid bruit    per duplex 03-11-2016 RICA 1-39%   Chronic throat clearing    COVID-19    DDD (degenerative disc disease), lumbar    Depression    ED (erectile dysfunction)    Elevated PSA    urologist-  dr [name]--- s/p  prostate bx's   GERD (gastroesophageal reflux disease)    Hematuria    History of chronic bronchitis    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    s/p  MOH's nasal --  pre-melanoma   History of squamous cell carcinoma excision    several times   Hyperlipidemia    Pre-diabetes     Premature ventricular contractions (PVCs) (VPCs)    RAD (reactive airway disease)    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  s/p  deep neck lymph node bx's and lung bx's   Sensorineural hearing loss (SNHL) of both ears    Sepsis ([place]) 09/2016   UTI (urinary tract infection) 08/2016   Wears glasses    Wears hearing aid    bilateral    Social History   Tobacco Use   Smoking status: Never   Smokeless tobacco: Never  Vaping Use   Vaping Use: Never used  Substance Use Topics   Alcohol use: No   Drug use: No    Family History  Problem Relation Age of Onset   Stroke Mother        in her 52s   Breast cancer Sister    Heart disease Maternal Grandmother 55       MI    Breast cancer Paternal Grandmother    Heart disease Paternal Grandfather 7       MI   Stroke Brother    Healthy Father    Diabetes Neg Hx     Allergies  Allergen Reactions   Ivp Dye [Iodinated Contrast Media] Hives, Shortness Of Breath, Itching and Palpitations    "eyes itching,  Heart racing,  Effected breathing" 10-27-16 pt with 13 hr pre-meds for CT without any reaction-kj   Metrizamide Shortness Of Breath, Itching and Palpitations     "eyes itching,  Heart racing,  Effected breathing" 10-27-16 pt with 13 hr pre-meds for CT without any reaction-kj   Shellfish Allergy Hives, Shortness Of Breath and Itching    Mostly crab   Gabapentin Other (See Comments)    dizziness and flushing   Niacin-Lovastatin Er Other (See Comments)    Did not feel good on medication   Gadolinium Hives   Adhesive [Tape] Itching and Rash    Use paper tape    Review of Systems  Constitutional: Negative.   Genitourinary: Negative.       OBJECTIVE:    Vitals:   11/11/21 1401  BP: 120/77  Pulse: 87  Temp: (!) 97.5 F (36.4 C)  TempSrc: Oral  SpO2: 96%  Weight: 178 lb (80.7 kg)     Body mass index is 27.88 kg/m.  Physical Exam Constitutional:      Appearance: Normal appearance.   HENT:     Head: Normocephalic and atraumatic.  Eyes:     Extraocular Movements: Extraocular movements intact.     Conjunctiva/sclera: Conjunctivae normal.  Pulmonary:  Effort: Pulmonary effort is normal. No respiratory distress.  Musculoskeletal:        General: Normal range of motion.  Skin:    General: Skin is warm and dry.  Neurological:     General: No focal deficit present.     Mental Status: He is alert. Mental status is at baseline.  Psychiatric:        Mood and Affect: Mood normal.        Behavior: Behavior normal.      Labs and Microbiology:     Latest Ref Rng & Units 10/11/2021    4:20 AM 10/10/2021    5:05 AM 10/09/2021    8:30 PM  CBC  WBC 4.0 - 10.5 K/uL 10.5  11.3  13.9   Hemoglobin 13.0 - 17.0 g/dL 11.8  11.7  12.5   Hematocrit 39.0 - 52.0 % 36.4  36.5  37.5   Platelets 150 - 400 K/uL 290  300  347       Latest Ref Rng & Units 10/11/2021    4:20 AM 10/10/2021    5:05 AM 10/09/2021    8:30 PM  CMP  Glucose 70 - 99 mg/dL 106  109  120   BUN 8 - 23 mg/dL '13  15  18   '$ Creatinine 0.61 - 1.24 mg/dL 0.97  1.17  1.16   Sodium 135 - 145 mmol/L 143  142  136   Potassium 3.5 - 5.1 mmol/L 3.8  3.5  3.6   Chloride 98 - 111 mmol/L 114  115  108   CO2 22 - 32 mmol/L '24  22  20   '$ Calcium 8.9 - 10.3 mg/dL 8.6  8.0  8.4   Total Protein 6.5 - 8.1 g/dL   7.2   Total Bilirubin 0.3 - 1.2 mg/dL   0.5   Alkaline Phos 38 - 126 U/L   94   AST 15 - 41 U/L   17   ALT 0 - 44 U/L   13       ASSESSMENT & PLAN:    Recurrent UTI Patient here today with history of recurrent urinary tract infections in the setting of prior urologic surgery with 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  He is clinically doing well today.  He continues on prophylactic methenamine and vitamin C.  Discussed antibiotic prophylaxis as well and would discourage this at this time so as to not contribute to further antimicrobial resistance.  Will  plan to have patient monitor for signs/symptoms of typical UTI and submit UA with culture at any onset of symptoms.  Future orders placed to have available.  Of note, urine studies will have to be interpreted with caution in the setting of ileal conduit, and that the full course of therapy would need to determined based on culture results and clinical course. Will also provide patient with a "pill in pocket" approach so that he has antibiotic on hand to start after submitting urine sample with fosfomycin 3gm x1 dose with plan to schedule expedited follow up in our clinic for clinical re-evaluation and to determine if further doses may be needed.  Fortunately, more recent cultures have not grown ESBL but he has grown this as recently as April 2023.  Encouraged continued hydration and to continue with non antibiotic preventive measures.    Orders Placed This Encounter  Procedures   Urine Culture    Standing Status:   Future  Standing Expiration Date:   11/12/2022   Urinalysis, Routine w reflex microscopic    Standing Status:   Future    Standing Expiration Date:   11/12/2022         [facility]ious [facility] 11/11/2021, 2:48 PM   I have personally spent 40 minutes involved in face-to-face and non-face-to-face activities for this patient on the day of the visit. Professional time spent includes the following activities: Preparing to see the patient (review of tests), Obtaining and/or reviewing separately obtained history (admission/discharge record), Performing a medically appropriate examination and/or evaluation , Ordering medications/tests/procedures, referring and communicating with other health care professionals, Documenting clinical information in the EMR, Independently interpreting results (not separately reported), Communicating results to the patient/family/caregiver, Counseling and educating the patient/family/caregiver and Care  coordination (not separately reported).

## 2021-11-11 NOTE — Assessment & Plan Note (Addendum)
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  He is clinically doing well today.  He continues on prophylactic methenamine and vitamin C.  Discussed antibiotic prophylaxis as well and would discourage this at this time so as to not contribute to further antimicrobial resistance.  Will plan to have patient monitor for signs/symptoms of typical UTI and submit UA with culture at any onset of symptoms.  Future orders placed to have available.  Of note, urine studies will have to be interpreted with caution in the setting of ileal conduit, and that the full course of therapy would need to determined based on culture results and clinical course. Will also provide patient with a "pill in pocket" approach so that he has antibiotic on hand to start after submitting urine sample with fosfomycin 3gm x1 dose with plan to schedule expedited follow up in our clinic for clinical re-evaluation and to determine if further doses may be needed.  Fortunately, more recent cultures have not grown ESBL but he has grown this as recently as April 2023.  Encouraged continued hydration and to continue with non antibiotic preventive measures.

## 2021-11-11 NOTE — Patient Instructions (Signed)
Thank you for coming to see me today. It was a pleasure seeing you.  To Do: If you develop symptoms of UTI, please call and submit a urine sample to our lab Then take 1 time dose of fosfomycin and try to get scheduled with me or my partners in the 24-48 hours following this If you were to worsen following taking fosfomycin, you may need to go to the ED  If you have any questions or concerns, please do not hesitate to call the office at [phone].  Take Care,   Jule Ser

## 2021-11-14 DIAGNOSIS — N200 Calculus of kidney: Secondary | ICD-10-CM | POA: Diagnosis not present

## 2021-11-16 ENCOUNTER — Telehealth: Payer: Self-pay | Admitting: Internal Medicine

## 2021-11-17 ENCOUNTER — Encounter: Payer: Self-pay | Admitting: Physician Assistant

## 2021-11-20 DIAGNOSIS — N200 Calculus of kidney: Secondary | ICD-10-CM | POA: Diagnosis not present

## 2021-12-11 ENCOUNTER — Other Ambulatory Visit: Payer: Self-pay | Admitting: Internal Medicine

## 2021-12-15 ENCOUNTER — Encounter: Payer: Self-pay | Admitting: Physician Assistant

## 2021-12-15 ENCOUNTER — Ambulatory Visit (INDEPENDENT_AMBULATORY_CARE_PROVIDER_SITE_OTHER): Payer: Medicare Other | Admitting: Physician Assistant

## 2021-12-15 VITALS — BP 122/82 | HR 68 | Ht 67.0 in | Wt 181.0 lb

## 2021-12-15 DIAGNOSIS — K219 Gastro-esophageal reflux disease without esophagitis: Secondary | ICD-10-CM | POA: Diagnosis not present

## 2021-12-15 DIAGNOSIS — R1310 Dysphagia, unspecified: Secondary | ICD-10-CM | POA: Diagnosis not present

## 2021-12-15 MED ORDER — OMEPRAZOLE 40 MG PO CPDR: 40.0000 mg | DELAYED_RELEASE_CAPSULE | Freq: Every day | ORAL | 5 refills | Status: DC

## 2021-12-15 NOTE — Patient Instructions (Signed)
You will be contacted by [facility] in the next 2 days to arrange a Barium Swallow with tablet.  The number on your caller ID will be [phone], please answer when they call.  If you have not heard from them in 2 days please call [phone] to schedule.   You have been scheduled for a Barium Esophogram at [facility] (1st floor of the hospital) on ____________________ at __________________. Please arrive 30 minutes prior to your appointment for registration. Make certain not to have anything to eat or drink 3 hours prior to your test. If you need to reschedule for any reason, please contact radiology at [phone] to do so. __________________________________________________________________ A barium swallow is an examination that concentrates on views of the esophagus. This tends to be a double contrast exam (barium and two liquids which, when combined, create a gas to distend the wall of the oesophagus) or single contrast (non-ionic iodine based). The study is usually tailored to your symptoms so a good history is essential. Attention is paid during the study to the form, structure and configuration of the esophagus, looking for functional disorders (such as aspiration, dysphagia, achalasia, motility and reflux) EXAMINATION You may be asked to change into a gown, depending on the type of swallow being performed. A radiologist and radiographer will perform the procedure. The radiologist will advise you of the type of contrast selected for your procedure and direct you during the exam. You will be asked to stand, sit or lie in several different positions and to hold a small amount of fluid in your mouth before being asked to swallow while the imaging is performed .In some instances you may be asked to swallow barium coated marshmallows to assess the motility of a solid food bolus. The exam can be recorded as a digital or video fluoroscopy procedure. POST PROCEDURE It will  take 1-2 days for the barium to pass through your system. To facilitate this, it is important, unless otherwise directed, to increase your fluids for the next 24-48hrs and to resume your normal diet.  This test typically takes about 30 minutes to perform. __________________________________________________________________________________     We have sent the following medications to your pharmacy for you to pick up at your convenience: Omeprazole  I appreciate the opportunity to care for you. Ellouise Newer PA-C

## 2021-12-15 NOTE — Telephone Encounter (Signed)
error 

## 2021-12-15 NOTE — Progress Notes (Signed)
Chief Complaint: Dysphagia and GERD  HPI:    Adrian Rios is an 81 year old male with a past medical history as listed below including bladder cancer status post robotic cystoprostatectomy with ileal conduit complicated by recurrent UTI and admissions for urosepsis, sarcoidosis, arthritis and multiple others, known to Dr.Pyrtle who was referred to me by [name], MD for a complaint of dysphagia and GERD.       11/04/2016 office visit with Dr. Hilarie Fredrickson for change in bowel habits and some blood in stool.  Recommended colonoscopy.  Does not look like this was ever done.    10/09/2021 CT renal stone study with chronic but improved bilateral hydroureteronephrosis, nonobstructing right renal stone and colonic diverticulosis without acute inflammation.    10/11/2021 CBC with hemoglobin of 11.8 (around baseline).  BMP with a glucose of 106.    Today, the patient tells me over the past 3 to 4 months he feels like he is getting strangled very easily when drinking water and choked.  Also tells me that he notices this most when he is taking his pills and it feels like they get hung up.  Has also noticed an increase in heartburn and reflux for which he is currently taking 3 Rolaids 3 times a day, more so at night as this is when it tends to bother him.  Associated symptoms include a change in his voice.    Denies fever, chills, abdominal pain, change in bowel habits or symptoms that awaken him from sleep.     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    s/p  MOH's nasal --  pre-melanoma   History of squamous cell carcinoma excision    several times   Hyperlipidemia    Pre-diabetes    Premature ventricular contractions (PVCs) (VPCs)    RAD (reactive airway disease)    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  s/p  deep neck lymph node bx's and lung bx's   Sensorineural hearing loss (SNHL) of both ears    Sepsis ([place]) 09/2016   UTI (urinary tract infection) 08/2016   Wears glasses    Wears hearing aid    bilateral    Past Surgical History:  Procedure Laterality Date   APPENDECTOMY  2008   CARDIOVASCULAR STRESS TEST  05/27/2010   normal nuclear study w/ no ischemia/  normal LV function and wall motion , ef 60%   CYSTOSCOPY WITH INJECTION N/A 06/12/2016   Procedure: CYSTOSCOPY WITH INJECTION OF INDOCYANINE GREEN DYE;  Surgeon: Alexis Frock, MD;  Location: [facility];  Service: Urology;  Laterality: N/A;   DEEP NECK LYMPH NODE BIOPSY / EXCISION  1970's   and Bronchoscopy w/ bx's ( dx Sarcoidosis)   ILEOSTOMY  IR NEPHROSTOMY PLACEMENT LEFT  11/18/2016   MOHS SURGERY  2013 approx.    nasal; pre melanoma   PARS PLANA VITRECTOMY Right 2007   repair macular pucker   TONSILLECTOMY AND ADENOIDECTOMY  child   TRANSURETHRAL RESECTION OF BLADDER TUMOR N/A 04/06/2016   Procedure: CYSTOSCOPY TRANSURETHRAL RESECTION OF BLADDER TUMORS (TURBT);  Surgeon: Carolan Clines, MD;  Location: [facility];  Service: Urology;  Laterality: N/A;   TRANSURETHRAL RESECTION OF PROSTATE      Current Outpatient Medications  Medication Sig Dispense Refill   acetaminophen (TYLENOL) 500 MG tablet Take 1,000 mg by mouth as needed for headache (pain).     ADVAIR DISKUS 250-50 MCG/ACT AEPB INHALE 1 PUFF INTO THE LUNGS EVERY 12 HOURS 60 each 2   albuterol (VENTOLIN HFA) 108 (90 Base) MCG/ACT inhaler  Inhale 2 puffs into the lungs every 6 (six) hours as needed for wheezing or shortness of breath. (Patient taking differently: Inhale 1-2 puffs into the lungs as needed for wheezing or shortness of breath.) 1 each 8   Ascorbic Acid (VITAMIN C) 1000 MG tablet Take 1,000 mg by mouth daily.     latanoprost (XALATAN) 0.005 % ophthalmic solution Place 1 drop into the right eye at bedtime.     magnesium oxide (MAG-OX) 400 MG tablet Take 400 mg by mouth daily with supper.     methenamine (HIPREX) 1 g tablet Take 1 tablet (1 g total) by mouth 2 (two) times daily with a meal.     Misc Natural Products (GLUCOSAMINE CHOND DOUBLE STR PO) Take 1 tablet by mouth 2 (two) times daily.     montelukast (SINGULAIR) 10 MG tablet TAKE ONE TABLET BY MOUTH EVERY NIGHT AT BEDTIME 90 tablet 2   triamcinolone (NASACORT) 55 MCG/ACT AERO nasal inhaler Place 2 sprays into the nose at bedtime.     venlafaxine (EFFEXOR) 75 MG tablet TAKE 1.5 TABLETS BY MOUTH DAILY (Patient taking differently: Take 112.5 mg by mouth daily.) 135 tablet 1   ibuprofen (ADVIL) 200 MG tablet Take 600 mg by mouth daily as needed for headache (pain). (Patient not taking: Reported on 12/15/2021)     No current facility-administered medications for this visit.    Allergies as of 12/15/2021 - Review Complete 12/15/2021  Allergen Reaction Noted   Ivp dye [iodinated contrast media] Hives, Shortness Of Breath, Itching, and Palpitations 04/02/2016   Metrizamide Shortness Of Breath, Itching, and Palpitations 04/02/2016   Shellfish allergy Hives, Shortness Of Breath, and Itching 04/02/2016   Gabapentin Other (See Comments) 04/28/2016   Niacin-lovastatin er Other (See Comments) 10/05/2006   Gadolinium Hives 04/08/2020   Adhesive [tape] Itching and Rash 04/02/2016    Family History  Problem Relation Age of Onset   Stroke Mother        in her 42s   Breast cancer Sister    Heart disease Maternal Grandmother 39       MI    Breast cancer Paternal  Grandmother    Heart disease Paternal Grandfather 66       MI   Stroke Brother    Healthy Father    Diabetes Neg Hx     Social History   Socioeconomic History   Marital status: Married    Spouse name: Not on file   Number of children: 2   Years of education: Not on file   Highest education level: Not on file  Occupational History   Occupation: retired  Tobacco Use   Smoking status: Never  Smokeless tobacco: Never  Vaping Use   Vaping Use: Never used  Substance and Sexual Activity   Alcohol use: No   Drug use: No   Sexual activity: Not Currently  Other Topics Concern   Not on file  Social History Narrative   Not on file   Social Determinants of Health   Financial Resource Strain: Low Risk  (11/01/2020)   Overall Financial Resource Strain (CARDIA)    Difficulty of Paying Living Expenses: Not hard at all  Food Insecurity: No Food Insecurity (09/24/2021)   Hunger Vital Sign    Worried About Running Out of Food in the Last Year: Never true    Ran Out of Food in the Last Year: Never true  Transportation Needs: No Transportation Needs (09/24/2021)   PRAPARE - Hydrologist (Medical): No    Lack of Transportation (Non-Medical): No  Physical Activity: Sufficiently Active (11/01/2020)   Exercise Vital Sign    Days of Exercise per Week: 5 days    Minutes of Exercise per Session: 30 min  Stress: No Stress Concern Present (11/01/2020)   [place]    Feeling of Stress : Not at all  Social Connections: Socially Integrated (11/01/2020)   Social Connection and Isolation Panel [NHANES]    Frequency of Communication with Friends and Family: More than three times a week    Frequency of Social Gatherings with Friends and Family: More than three times a week    Attends Religious Services: More than 4 times per year    Active Member of [employer] or Organizations: Yes    Attends Arts administrator: More than 4 times per year    Marital Status: Married  Human resources officer Violence: Not At Risk (11/01/2020)   Humiliation, Afraid, Rape, and Kick questionnaire    Fear of Current or Ex-Partner: No    Emotionally Abused: No    Physically Abused: No    Sexually Abused: No    Review of Systems:    Constitutional: No weight loss, fever or chills Skin: No rash  Cardiovascular: No chest pain Respiratory: No SOB  Gastrointestinal: See HPI and otherwise negative Genitourinary: No dysuria  Neurological: No headache, dizziness or syncope Musculoskeletal: No new muscle or joint pain Hematologic: No bleeding  Psychiatric: No history of depression or anxiety   Physical Exam:  Vital signs: BP 122/82   Pulse 68   Ht '5\' 7"'$  (1.702 m)   Wt 181 lb (82.1 kg)   BMI 28.35 kg/m   Constitutional:   Pleasant Elderly Caucasian male appears to be in NAD, Well developed, Well nourished, alert and cooperative Head:  Normocephalic and atraumatic. Eyes:   PEERL, EOMI. No icterus. Conjunctiva pink. Ears:  +hearing aids Neck:  Supple Throat: Oral cavity and pharynx without inflammation, swelling or lesion.  Respiratory: Respirations even and unlabored. Lungs clear to auscultation bilaterally.   No wheezes, crackles, or rhonchi.  Cardiovascular: Normal S1, S2. No MRG. Regular rate and rhythm. No peripheral edema, cyanosis or pallor.  Gastrointestinal:  Soft, nondistended, nontender. No rebound or guarding. Normal bowel sounds. No appreciable masses or hepatomegaly. +colostomy Rectal:  Not performed.  Msk:  Symmetrical without gross deformities. Without edema, no deformity or joint abnormality.  Neurologic:  Alert and  oriented x4;  grossly normal neurologically.  Skin:   Dry and intact without significant lesions or rashes. Psychiatric:  Demonstrates good judgement and reason without abnormal affect or behaviors.  RELEVANT LABS AND IMAGING: CBC    Component Value Date/Time   WBC 10.5 10/11/2021  0420   RBC 3.87 (L) 10/11/2021 0420   HGB 11.8 (L) 10/11/2021 0420   HCT 36.4 (L) 10/11/2021 0420   PLT 290 10/11/2021 0420   MCV 94.1 10/11/2021 0420   MCH 30.5 10/11/2021 0420   MCHC 32.4 10/11/2021 0420   RDW 13.2 10/11/2021 0420   LYMPHSABS 1.8 10/09/2021 2030   MONOABS 1.0 10/09/2021 2030   EOSABS 0.1 10/09/2021 2030   BASOSABS 0.1 10/09/2021 2030    CMP     Component Value Date/Time   NA 143 10/11/2021 0420   K 3.8 10/11/2021 0420   CL 114 (H) 10/11/2021 0420   CO2 24 10/11/2021 0420   GLUCOSE 106 (H) 10/11/2021 0420   BUN 13 10/11/2021 0420   CREATININE 0.97 10/11/2021 0420   CREATININE 1.11 01/11/2017 1651   CALCIUM 8.6 (L) 10/11/2021 0420   PROT 7.2 10/09/2021 2030   ALBUMIN 3.5 10/09/2021 2030   AST 17 10/09/2021 2030   ALT 13 10/09/2021 2030   ALKPHOS 94 10/09/2021 2030   BILITOT 0.5 10/09/2021 2030   GFRNONAA >60 10/11/2021 0420   GFRAA >60 11/15/2016 0737    Assessment: 1.  Dysphagia: Worse over the past 3 to 4 months, typically with pills and sometimes food, feels like he is strangled on water, history of sarcoidosis and GERD; consider esophagitis +/- stricture versus dysmotility 2.  GERD: With above  Plan: 1.  Scheduled patient for barium esophagram with tablet for further evaluation of dysphagia. 2.  Started the patient on Omeprazole 40 mg every morning, 30 minutes before breakfast.  #30 with 5 refills. 3.  Reviewed anti-dysphagia and antireflux measures. 4.  Patient to follow in clinic per recommendations after imaging above.  Ellouise Newer, PA-C [facility] 12/15/2021, 2:28 PM  Cc: [name], MD

## 2021-12-17 ENCOUNTER — Telehealth: Payer: Self-pay

## 2021-12-17 NOTE — Telephone Encounter (Signed)
Patient returned phone call. °

## 2021-12-17 NOTE — Patient Outreach (Signed)
  Care Coordination   12/17/2021 Name: Adrian Rios MRN: [id number] DOB: 1940-11-10   Care Coordination Outreach Attempts:  An unsuccessful telephone outreach was attempted today to offer the patient information about available care coordination services as a benefit of their health plan.   Follow Up Plan:  Additional outreach attempts will be made to offer the patient care coordination information and services.   Encounter Outcome:  No Answer  Care Coordination Interventions Activated:  No   Care Coordination Interventions:  No, not indicated    Thea Silversmith, RN, MSN, BSN, [place] Coordinator [phone]

## 2021-12-18 NOTE — Progress Notes (Signed)
Addendum: Reviewed and agree with assessment and management plan. EGD may be recommended after review of barium esophagram. Tiani Stanbery, Lajuan Lines, MD

## 2021-12-22 ENCOUNTER — Encounter: Payer: Self-pay | Admitting: Internal Medicine

## 2021-12-25 ENCOUNTER — Ambulatory Visit (HOSPITAL_COMMUNITY)
Admission: RE | Admit: 2021-12-25 | Discharge: 2021-12-25 | Disposition: A | Discharge status: Home / Self Care | Payer: Medicare Other | Source: Ambulatory Visit | Attending: Physician Assistant | Admitting: Physician Assistant

## 2021-12-25 DIAGNOSIS — K224 Dyskinesia of esophagus: Secondary | ICD-10-CM | POA: Diagnosis not present

## 2021-12-25 DIAGNOSIS — R1310 Dysphagia, unspecified: Secondary | ICD-10-CM | POA: Diagnosis not present

## 2021-12-25 DIAGNOSIS — K219 Gastro-esophageal reflux disease without esophagitis: Secondary | ICD-10-CM | POA: Diagnosis not present

## 2021-12-26 ENCOUNTER — Telehealth: Payer: Self-pay

## 2021-12-26 NOTE — Telephone Encounter (Signed)
Lm on mobile vm for patient to return call to discuss barium esophagram results and recommendations.

## 2021-12-26 NOTE — Telephone Encounter (Signed)
-----   Message from Levin Erp, [place] sent at 12/26/2021  9:17 AM EDT ----- Regarding: FW: egd? Please discuss recs per Dr. Hilarie Fredrickson. Thanks-JLL ----- Message ----- From: Jerene Bears, MD Sent: 12/25/2021   5:48 PM EDT To: Levin Erp, PA Subject: RE: egd?                                       Upper endoscopy with empiric dilation would not be wrong but we should be sure he understands that this appears to be predominantly dysmotility.  Some patients do respond and have improvement in swallowing with dilation even without a definitive stricture being seen If after he receives this information he wishes to proceed with upper endoscopy this can be scheduled Thanks JMP  ----- Message ----- From: Levin Erp, PA Sent: 12/25/2021   3:28 PM EDT To: Jerene Bears, MD Subject: egd?                                           EGD?  Thanks- JLL ----- Message ----- From: Interface, Rad Results In Sent: 12/25/2021  12:06 PM EDT To: Levin Erp, PA

## 2021-12-31 NOTE — Telephone Encounter (Signed)
Called and spoke with patient regarding results and recommendations as outlined below. Pt would like to take some time to think about this. He will call back if he decides to proceed with EGD with dilation. Pt verbalized understanding of the information and had no concerns at the end of the call.

## 2022-01-02 DIAGNOSIS — Z23 Encounter for immunization: Secondary | ICD-10-CM | POA: Diagnosis not present

## 2022-01-13 ENCOUNTER — Other Ambulatory Visit: Payer: Self-pay | Admitting: Internal Medicine

## 2022-01-15 ENCOUNTER — Telehealth: Payer: Self-pay

## 2022-01-15 NOTE — Patient Outreach (Signed)
  Care Coordination   Initial Visit Note   01/15/2022 Name: EDEL RIVERO MRN: [id number] DOB: March 07, 1940  Harvel Ricks Crotteau is a 81 y.o. year old male who sees Burns, Claudina Lick, MD for primary care. I spoke with  Lelon Frohlich by phone today.  What matters to the patients health and wellness today?  Patient denies any care coordination, disease management or resource needs at this time. He reports the only problems he has had is recurrent UTI, but states his providers are managing and denies any additional education/support. Patient states he will contact primary care provider if needs change.     Goals Addressed             This Visit's Progress    COMPLETED: Care Coordination Activities       Care Coordination Interventions: Discussed care coordination program Discussed signs/symptoms of UTI and importance of notifying provider at first sign of infection Informed patient he can contact RNCM or Primary Care Provider if care coordination needs in the future        SDOH assessments and interventions completed:  Yes  SDOH Interventions Today    Flowsheet Row Most Recent Value  SDOH Interventions   Housing Interventions Intervention Not Indicated  Utilities Interventions Intervention Not Indicated     Care Coordination Interventions Activated:  Yes  Care Coordination Interventions:  Yes, provided   Follow up plan: No further intervention required.   Encounter Outcome:  Pt. Visit Completed   Thea Silversmith, RN, MSN, BSN, [place] Coordinator [phone]

## 2022-03-26 ENCOUNTER — Encounter: Payer: Self-pay | Admitting: Internal Medicine

## 2022-03-26 DIAGNOSIS — N132 Hydronephrosis with renal and ureteral calculous obstruction: Secondary | ICD-10-CM

## 2022-04-10 DIAGNOSIS — H43812 Vitreous degeneration, left eye: Secondary | ICD-10-CM | POA: Diagnosis not present

## 2022-04-10 DIAGNOSIS — B5801 Toxoplasma chorioretinitis: Secondary | ICD-10-CM | POA: Diagnosis not present

## 2022-04-10 DIAGNOSIS — H401134 Primary open-angle glaucoma, bilateral, indeterminate stage: Secondary | ICD-10-CM | POA: Diagnosis not present

## 2022-04-10 DIAGNOSIS — Z961 Presence of intraocular lens: Secondary | ICD-10-CM | POA: Diagnosis not present

## 2022-04-10 DIAGNOSIS — H4051X4 Glaucoma secondary to other eye disorders, right eye, indeterminate stage: Secondary | ICD-10-CM | POA: Diagnosis not present

## 2022-04-10 DIAGNOSIS — Z91041 Radiographic dye allergy status: Secondary | ICD-10-CM | POA: Diagnosis not present

## 2022-04-10 DIAGNOSIS — H02886 Meibomian gland dysfunction of left eye, unspecified eyelid: Secondary | ICD-10-CM | POA: Diagnosis not present

## 2022-04-10 DIAGNOSIS — H2512 Age-related nuclear cataract, left eye: Secondary | ICD-10-CM | POA: Diagnosis not present

## 2022-04-10 DIAGNOSIS — D2310 Other benign neoplasm of skin of unspecified eyelid, including canthus: Secondary | ICD-10-CM | POA: Diagnosis not present

## 2022-04-10 DIAGNOSIS — H02823 Cysts of right eye, unspecified eyelid: Secondary | ICD-10-CM | POA: Diagnosis not present

## 2022-04-10 DIAGNOSIS — H16143 Punctate keratitis, bilateral: Secondary | ICD-10-CM | POA: Diagnosis not present

## 2022-04-10 DIAGNOSIS — Z888 Allergy status to other drugs, medicaments and biological substances status: Secondary | ICD-10-CM | POA: Diagnosis not present

## 2022-04-18 DIAGNOSIS — J42 Unspecified chronic bronchitis: Secondary | ICD-10-CM | POA: Diagnosis present

## 2022-04-18 DIAGNOSIS — F32A Depression, unspecified: Secondary | ICD-10-CM | POA: Diagnosis present

## 2022-04-18 DIAGNOSIS — J9601 Acute respiratory failure with hypoxia: Secondary | ICD-10-CM | POA: Diagnosis present

## 2022-04-18 DIAGNOSIS — Z803 Family history of malignant neoplasm of breast: Secondary | ICD-10-CM | POA: Diagnosis not present

## 2022-04-18 DIAGNOSIS — A4159 Other Gram-negative sepsis: Principal | ICD-10-CM | POA: Diagnosis present

## 2022-04-18 DIAGNOSIS — E8770 Fluid overload, unspecified: Secondary | ICD-10-CM | POA: Diagnosis present

## 2022-04-18 DIAGNOSIS — R0902 Hypoxemia: Secondary | ICD-10-CM | POA: Diagnosis not present

## 2022-04-18 DIAGNOSIS — N1330 Unspecified hydronephrosis: Secondary | ICD-10-CM | POA: Diagnosis not present

## 2022-04-18 DIAGNOSIS — D638 Anemia in other chronic diseases classified elsewhere: Secondary | ICD-10-CM | POA: Diagnosis present

## 2022-04-18 DIAGNOSIS — N390 Urinary tract infection, site not specified: Secondary | ICD-10-CM | POA: Diagnosis not present

## 2022-04-18 DIAGNOSIS — K5730 Diverticulosis of large intestine without perforation or abscess without bleeding: Secondary | ICD-10-CM | POA: Diagnosis not present

## 2022-04-18 DIAGNOSIS — Z1612 Extended spectrum beta lactamase (ESBL) resistance: Secondary | ICD-10-CM | POA: Diagnosis present

## 2022-04-18 DIAGNOSIS — Z8551 Personal history of malignant neoplasm of bladder: Secondary | ICD-10-CM

## 2022-04-18 DIAGNOSIS — I959 Hypotension, unspecified: Secondary | ICD-10-CM | POA: Diagnosis not present

## 2022-04-18 DIAGNOSIS — N201 Calculus of ureter: Secondary | ICD-10-CM | POA: Diagnosis not present

## 2022-04-18 DIAGNOSIS — K219 Gastro-esophageal reflux disease without esophagitis: Secondary | ICD-10-CM | POA: Diagnosis present

## 2022-04-18 DIAGNOSIS — E861 Hypovolemia: Secondary | ICD-10-CM | POA: Diagnosis present

## 2022-04-18 DIAGNOSIS — K5900 Constipation, unspecified: Secondary | ICD-10-CM | POA: Diagnosis present

## 2022-04-18 DIAGNOSIS — Z91048 Other nonmedicinal substance allergy status: Secondary | ICD-10-CM

## 2022-04-18 DIAGNOSIS — N10 Acute tubulo-interstitial nephritis: Secondary | ICD-10-CM | POA: Diagnosis not present

## 2022-04-18 DIAGNOSIS — Z936 Other artificial openings of urinary tract status: Secondary | ICD-10-CM

## 2022-04-18 DIAGNOSIS — Z91041 Radiographic dye allergy status: Secondary | ICD-10-CM

## 2022-04-18 DIAGNOSIS — M25512 Pain in left shoulder: Secondary | ICD-10-CM | POA: Diagnosis not present

## 2022-04-18 DIAGNOSIS — Z91013 Allergy to seafood: Secondary | ICD-10-CM

## 2022-04-18 DIAGNOSIS — Z8616 Personal history of COVID-19: Secondary | ICD-10-CM | POA: Diagnosis not present

## 2022-04-18 DIAGNOSIS — E860 Dehydration: Secondary | ICD-10-CM | POA: Diagnosis present

## 2022-04-18 DIAGNOSIS — R6520 Severe sepsis without septic shock: Secondary | ICD-10-CM | POA: Diagnosis not present

## 2022-04-18 DIAGNOSIS — Z888 Allergy status to other drugs, medicaments and biological substances status: Secondary | ICD-10-CM

## 2022-04-18 DIAGNOSIS — G8929 Other chronic pain: Secondary | ICD-10-CM | POA: Diagnosis not present

## 2022-04-18 DIAGNOSIS — Z1152 Encounter for screening for COVID-19: Secondary | ICD-10-CM | POA: Diagnosis not present

## 2022-04-18 DIAGNOSIS — Z8249 Family history of ischemic heart disease and other diseases of the circulatory system: Secondary | ICD-10-CM | POA: Diagnosis not present

## 2022-04-18 DIAGNOSIS — R109 Unspecified abdominal pain: Secondary | ICD-10-CM | POA: Diagnosis not present

## 2022-04-18 DIAGNOSIS — B961 Klebsiella pneumoniae [K. pneumoniae] as the cause of diseases classified elsewhere: Secondary | ICD-10-CM | POA: Diagnosis not present

## 2022-04-18 DIAGNOSIS — N400 Enlarged prostate without lower urinary tract symptoms: Secondary | ICD-10-CM | POA: Diagnosis present

## 2022-04-18 DIAGNOSIS — E785 Hyperlipidemia, unspecified: Secondary | ICD-10-CM | POA: Diagnosis present

## 2022-04-18 DIAGNOSIS — N281 Cyst of kidney, acquired: Secondary | ICD-10-CM | POA: Diagnosis not present

## 2022-04-18 DIAGNOSIS — R519 Headache, unspecified: Secondary | ICD-10-CM | POA: Diagnosis not present

## 2022-04-18 DIAGNOSIS — N132 Hydronephrosis with renal and ureteral calculous obstruction: Secondary | ICD-10-CM | POA: Diagnosis not present

## 2022-04-18 DIAGNOSIS — Z79899 Other long term (current) drug therapy: Secondary | ICD-10-CM

## 2022-04-18 DIAGNOSIS — R079 Chest pain, unspecified: Secondary | ICD-10-CM | POA: Diagnosis not present

## 2022-04-18 DIAGNOSIS — R000 Tachycardia, unspecified: Secondary | ICD-10-CM | POA: Diagnosis not present

## 2022-04-18 DIAGNOSIS — M47816 Spondylosis without myelopathy or radiculopathy, lumbar region: Secondary | ICD-10-CM | POA: Diagnosis not present

## 2022-04-18 DIAGNOSIS — A419 Sepsis, unspecified organism: Secondary | ICD-10-CM | POA: Diagnosis not present

## 2022-04-18 DIAGNOSIS — N136 Pyonephrosis: Secondary | ICD-10-CM | POA: Diagnosis present

## 2022-04-18 DIAGNOSIS — Z823 Family history of stroke: Secondary | ICD-10-CM | POA: Diagnosis not present

## 2022-04-18 DIAGNOSIS — M25511 Pain in right shoulder: Secondary | ICD-10-CM | POA: Diagnosis not present

## 2022-04-18 DIAGNOSIS — N179 Acute kidney failure, unspecified: Secondary | ICD-10-CM | POA: Diagnosis not present

## 2022-04-18 DIAGNOSIS — D649 Anemia, unspecified: Secondary | ICD-10-CM | POA: Diagnosis not present

## 2022-04-18 DIAGNOSIS — Z7951 Long term (current) use of inhaled steroids: Secondary | ICD-10-CM

## 2022-04-19 ENCOUNTER — Encounter (HOSPITAL_BASED_OUTPATIENT_CLINIC_OR_DEPARTMENT_OTHER): Payer: Self-pay | Admitting: Emergency Medicine

## 2022-04-19 ENCOUNTER — Other Ambulatory Visit: Payer: Self-pay

## 2022-04-19 ENCOUNTER — Inpatient Hospital Stay (HOSPITAL_COMMUNITY): Payer: Medicare Other

## 2022-04-19 ENCOUNTER — Emergency Department (HOSPITAL_BASED_OUTPATIENT_CLINIC_OR_DEPARTMENT_OTHER): Payer: Medicare Other

## 2022-04-19 ENCOUNTER — Inpatient Hospital Stay (HOSPITAL_BASED_OUTPATIENT_CLINIC_OR_DEPARTMENT_OTHER)
Admission: EM | Admit: 2022-04-19 | Discharge: 2022-04-22 | DRG: 871 | Disposition: A | Discharge status: Home / Self Care | Payer: Medicare Other | Attending: Internal Medicine | Admitting: Internal Medicine

## 2022-04-19 ENCOUNTER — Emergency Department (HOSPITAL_COMMUNITY): Payer: Medicare Other

## 2022-04-19 DIAGNOSIS — J9601 Acute respiratory failure with hypoxia: Secondary | ICD-10-CM | POA: Diagnosis not present

## 2022-04-19 DIAGNOSIS — R6520 Severe sepsis without septic shock: Secondary | ICD-10-CM | POA: Diagnosis present

## 2022-04-19 DIAGNOSIS — Z823 Family history of stroke: Secondary | ICD-10-CM | POA: Diagnosis not present

## 2022-04-19 DIAGNOSIS — G8929 Other chronic pain: Secondary | ICD-10-CM | POA: Diagnosis present

## 2022-04-19 DIAGNOSIS — N201 Calculus of ureter: Secondary | ICD-10-CM | POA: Diagnosis present

## 2022-04-19 DIAGNOSIS — N179 Acute kidney failure, unspecified: Secondary | ICD-10-CM | POA: Diagnosis present

## 2022-04-19 DIAGNOSIS — N10 Acute tubulo-interstitial nephritis: Secondary | ICD-10-CM | POA: Diagnosis present

## 2022-04-19 DIAGNOSIS — E860 Dehydration: Secondary | ICD-10-CM | POA: Diagnosis present

## 2022-04-19 DIAGNOSIS — E861 Hypovolemia: Secondary | ICD-10-CM | POA: Diagnosis present

## 2022-04-19 DIAGNOSIS — Z8616 Personal history of COVID-19: Secondary | ICD-10-CM | POA: Diagnosis not present

## 2022-04-19 DIAGNOSIS — R7881 Bacteremia: Secondary | ICD-10-CM | POA: Diagnosis present

## 2022-04-19 DIAGNOSIS — Z8551 Personal history of malignant neoplasm of bladder: Secondary | ICD-10-CM

## 2022-04-19 DIAGNOSIS — Z936 Other artificial openings of urinary tract status: Secondary | ICD-10-CM

## 2022-04-19 DIAGNOSIS — N136 Pyonephrosis: Secondary | ICD-10-CM | POA: Diagnosis present

## 2022-04-19 DIAGNOSIS — M25512 Pain in left shoulder: Secondary | ICD-10-CM | POA: Diagnosis not present

## 2022-04-19 DIAGNOSIS — R519 Headache, unspecified: Secondary | ICD-10-CM | POA: Diagnosis not present

## 2022-04-19 DIAGNOSIS — D649 Anemia, unspecified: Secondary | ICD-10-CM | POA: Clinically undetermined

## 2022-04-19 DIAGNOSIS — D638 Anemia in other chronic diseases classified elsewhere: Secondary | ICD-10-CM | POA: Diagnosis present

## 2022-04-19 DIAGNOSIS — A419 Sepsis, unspecified organism: Secondary | ICD-10-CM | POA: Diagnosis present

## 2022-04-19 DIAGNOSIS — F32A Depression, unspecified: Secondary | ICD-10-CM | POA: Diagnosis present

## 2022-04-19 DIAGNOSIS — A4159 Other Gram-negative sepsis: Secondary | ICD-10-CM | POA: Diagnosis present

## 2022-04-19 DIAGNOSIS — R079 Chest pain, unspecified: Secondary | ICD-10-CM | POA: Diagnosis not present

## 2022-04-19 DIAGNOSIS — N281 Cyst of kidney, acquired: Secondary | ICD-10-CM | POA: Diagnosis not present

## 2022-04-19 DIAGNOSIS — R0902 Hypoxemia: Secondary | ICD-10-CM | POA: Diagnosis not present

## 2022-04-19 DIAGNOSIS — E8770 Fluid overload, unspecified: Secondary | ICD-10-CM | POA: Diagnosis present

## 2022-04-19 DIAGNOSIS — N390 Urinary tract infection, site not specified: Secondary | ICD-10-CM | POA: Diagnosis present

## 2022-04-19 DIAGNOSIS — N400 Enlarged prostate without lower urinary tract symptoms: Secondary | ICD-10-CM | POA: Diagnosis present

## 2022-04-19 DIAGNOSIS — N1330 Unspecified hydronephrosis: Secondary | ICD-10-CM | POA: Diagnosis not present

## 2022-04-19 DIAGNOSIS — N132 Hydronephrosis with renal and ureteral calculous obstruction: Secondary | ICD-10-CM | POA: Diagnosis not present

## 2022-04-19 DIAGNOSIS — Z1152 Encounter for screening for COVID-19: Secondary | ICD-10-CM | POA: Diagnosis not present

## 2022-04-19 DIAGNOSIS — K219 Gastro-esophageal reflux disease without esophagitis: Secondary | ICD-10-CM | POA: Diagnosis present

## 2022-04-19 DIAGNOSIS — Z1612 Extended spectrum beta lactamase (ESBL) resistance: Secondary | ICD-10-CM | POA: Diagnosis present

## 2022-04-19 DIAGNOSIS — J42 Unspecified chronic bronchitis: Secondary | ICD-10-CM | POA: Diagnosis present

## 2022-04-19 DIAGNOSIS — Z91041 Radiographic dye allergy status: Secondary | ICD-10-CM | POA: Diagnosis not present

## 2022-04-19 DIAGNOSIS — K5900 Constipation, unspecified: Secondary | ICD-10-CM | POA: Diagnosis present

## 2022-04-19 DIAGNOSIS — Z803 Family history of malignant neoplasm of breast: Secondary | ICD-10-CM | POA: Diagnosis not present

## 2022-04-19 DIAGNOSIS — R109 Unspecified abdominal pain: Secondary | ICD-10-CM | POA: Diagnosis not present

## 2022-04-19 DIAGNOSIS — I959 Hypotension, unspecified: Secondary | ICD-10-CM | POA: Diagnosis not present

## 2022-04-19 DIAGNOSIS — M25511 Pain in right shoulder: Secondary | ICD-10-CM | POA: Diagnosis not present

## 2022-04-19 DIAGNOSIS — M47816 Spondylosis without myelopathy or radiculopathy, lumbar region: Secondary | ICD-10-CM | POA: Diagnosis not present

## 2022-04-19 DIAGNOSIS — Z8249 Family history of ischemic heart disease and other diseases of the circulatory system: Secondary | ICD-10-CM | POA: Diagnosis not present

## 2022-04-19 DIAGNOSIS — B961 Klebsiella pneumoniae [K. pneumoniae] as the cause of diseases classified elsewhere: Secondary | ICD-10-CM | POA: Diagnosis not present

## 2022-04-19 DIAGNOSIS — K5730 Diverticulosis of large intestine without perforation or abscess without bleeding: Secondary | ICD-10-CM | POA: Diagnosis not present

## 2022-04-19 HISTORY — DX: Calculus of ureter: N20.1

## 2022-04-19 HISTORY — PX: IR NEPHROSTOMY PLACEMENT RIGHT: IMG6064

## 2022-04-19 LAB — COMPREHENSIVE METABOLIC PANEL
ALT: 28 U/L (ref 0–44)
AST: 30 U/L (ref 15–41)
Albumin: 3.4 g/dL — ABNORMAL LOW (ref 3.5–5.0)
Alkaline Phosphatase: 108 U/L (ref 38–126)
Anion gap: 5 (ref 5–15)
BUN: 23 mg/dL (ref 8–23)
CO2: 20 mmol/L — ABNORMAL LOW (ref 22–32)
Calcium: 8.5 mg/dL — ABNORMAL LOW (ref 8.9–10.3)
Chloride: 110 mmol/L (ref 98–111)
Creatinine, Ser: 1.88 mg/dL — ABNORMAL HIGH (ref 0.61–1.24)
GFR, Estimated: 35 mL/min — ABNORMAL LOW (ref >60)
Glucose, Bld: 151 mg/dL — ABNORMAL HIGH (ref 70–99)
Potassium: 3.9 mmol/L (ref 3.5–5.1)
Sodium: 135 mmol/L (ref 135–145)
Total Bilirubin: 0.5 mg/dL (ref 0.3–1.2)
Total Protein: 7 g/dL (ref 6.5–8.1)

## 2022-04-19 LAB — URINALYSIS, W/ REFLEX TO CULTURE (INFECTION SUSPECTED)
Bilirubin Urine: NEGATIVE
Glucose, UA: NEGATIVE mg/dL
Ketones, ur: NEGATIVE mg/dL
Nitrite: POSITIVE — AB
Protein, ur: NEGATIVE mg/dL
Specific Gravity, Urine: 1.01 (ref 1.005–1.030)
pH: 6.5 (ref 5.0–8.0)

## 2022-04-19 LAB — CBC WITH DIFFERENTIAL/PLATELET
Abs Immature Granulocytes: 0.04 10*3/uL (ref 0.00–0.07)
Basophils Absolute: 0 10*3/uL (ref 0.0–0.1)
Basophils Relative: 0 %
Eosinophils Absolute: 0.1 10*3/uL (ref 0.0–0.5)
Eosinophils Relative: 0 %
HCT: 39.1 % (ref 39.0–52.0)
Hemoglobin: 13 g/dL (ref 13.0–17.0)
Immature Granulocytes: 0 %
Lymphocytes Relative: 8 %
Lymphs Abs: 0.9 10*3/uL (ref 0.7–4.0)
MCH: 30.8 pg (ref 26.0–34.0)
MCHC: 33.2 g/dL (ref 30.0–36.0)
MCV: 92.7 fL (ref 80.0–100.0)
Monocytes Absolute: 0.7 10*3/uL (ref 0.1–1.0)
Monocytes Relative: 6 %
Neutro Abs: 9.6 10*3/uL — ABNORMAL HIGH (ref 1.7–7.7)
Neutrophils Relative %: 86 %
Platelets: 280 10*3/uL (ref 150–400)
RBC: 4.22 MIL/uL (ref 4.22–5.81)
RDW: 12.9 % (ref 11.5–15.5)
WBC: 11.2 10*3/uL — ABNORMAL HIGH (ref 4.0–10.5)
nRBC: 0 % (ref 0.0–0.2)

## 2022-04-19 LAB — RESP PANEL BY RT-PCR (RSV, FLU A&B, COVID)  RVPGX2
Influenza A by PCR: NEGATIVE
Influenza B by PCR: NEGATIVE
Resp Syncytial Virus by PCR: NEGATIVE
SARS Coronavirus 2 by RT PCR: NEGATIVE

## 2022-04-19 LAB — CREATININE, SERUM
Creatinine, Ser: 1.6 mg/dL — ABNORMAL HIGH (ref 0.61–1.24)
GFR, Estimated: 43 mL/min — ABNORMAL LOW (ref >60)

## 2022-04-19 LAB — CBC
HCT: 28.4 % — ABNORMAL LOW (ref 39.0–52.0)
Hemoglobin: 9 g/dL — ABNORMAL LOW (ref 13.0–17.0)
MCH: 31.4 pg (ref 26.0–34.0)
MCHC: 31.7 g/dL (ref 30.0–36.0)
MCV: 99 fL (ref 80.0–100.0)
Platelets: 178 10*3/uL (ref 150–400)
RBC: 2.87 MIL/uL — ABNORMAL LOW (ref 4.22–5.81)
RDW: 13 % (ref 11.5–15.5)
WBC: 13.1 10*3/uL — ABNORMAL HIGH (ref 4.0–10.5)
nRBC: 0 % (ref 0.0–0.2)

## 2022-04-19 LAB — APTT: aPTT: 35 seconds (ref 24–36)

## 2022-04-19 LAB — PROTIME-INR
INR: 1.1 (ref 0.8–1.2)
Prothrombin Time: 14 seconds (ref 11.4–15.2)

## 2022-04-19 LAB — MRSA NEXT GEN BY PCR, NASAL: MRSA by PCR Next Gen: NOT DETECTED

## 2022-04-19 LAB — LACTIC ACID, PLASMA: Lactic Acid, Venous: 1.8 mmol/L (ref 0.5–1.9)

## 2022-04-19 MED ORDER — LACTATED RINGERS IV BOLUS (SEPSIS)
500.0000 mL | Freq: Once | INTRAVENOUS | Status: AC
  Administered 2022-04-19: 500 mL via INTRAVENOUS

## 2022-04-19 MED ORDER — IBUPROFEN 400 MG PO TABS
400.0000 mg | ORAL_TABLET | Freq: Once | ORAL | Status: AC
  Administered 2022-04-19: 400 mg via ORAL
  Filled 2022-04-19: qty 1

## 2022-04-19 MED ORDER — ALBUTEROL SULFATE (2.5 MG/3ML) 0.083% IN NEBU: 2.5000 mg | INHALATION_SOLUTION | Freq: Four times a day (QID) | RESPIRATORY_TRACT | Status: DC

## 2022-04-19 MED ORDER — OXYCODONE HCL 5 MG PO TABS
5.0000 mg | ORAL_TABLET | ORAL | Status: DC | PRN
  Administered 2022-04-19 – 2022-04-21 (×4): 5 mg via ORAL
  Filled 2022-04-19 (×4): qty 1

## 2022-04-19 MED ORDER — DIPHENHYDRAMINE HCL 50 MG/ML IJ SOLN
INTRAMUSCULAR | Status: AC | PRN
  Administered 2022-04-19: 50 mg via INTRAVENOUS

## 2022-04-19 MED ORDER — SODIUM CHLORIDE 0.9 % IV SOLN
2.0000 g | Freq: Once | INTRAVENOUS | Status: AC
  Administered 2022-04-19: 2 g via INTRAVENOUS
  Filled 2022-04-19: qty 12.5

## 2022-04-19 MED ORDER — VANCOMYCIN HCL 1750 MG/350ML IV SOLN
1750.0000 mg | Freq: Once | INTRAVENOUS | Status: AC
  Administered 2022-04-19: 1750 mg via INTRAVENOUS
  Filled 2022-04-19: qty 350

## 2022-04-19 MED ORDER — SODIUM CHLORIDE 0.9 % IV SOLN
INTRAVENOUS | Status: DC | PRN
  Administered 2022-04-19: 10 mL/h via INTRAVENOUS

## 2022-04-19 MED ORDER — MAGNESIUM OXIDE -MG SUPPLEMENT 400 (240 MG) MG PO TABS
400.0000 mg | ORAL_TABLET | Freq: Every day | ORAL | Status: DC
  Administered 2022-04-19 – 2022-04-21 (×3): 400 mg via ORAL
  Filled 2022-04-19 (×3): qty 1

## 2022-04-19 MED ORDER — CHLORHEXIDINE GLUCONATE CLOTH 2 % EX PADS
6.0000 | MEDICATED_PAD | Freq: Every day | CUTANEOUS | Status: DC
  Administered 2022-04-19 – 2022-04-22 (×4): 6 via TOPICAL

## 2022-04-19 MED ORDER — LACTATED RINGERS IV SOLN: INTRAVENOUS | Status: DC

## 2022-04-19 MED ORDER — METOCLOPRAMIDE HCL 5 MG/ML IJ SOLN: 10.0000 mg | Freq: Once | INTRAMUSCULAR | Status: DC

## 2022-04-19 MED ORDER — IPRATROPIUM-ALBUTEROL 0.5-2.5 (3) MG/3ML IN SOLN: 3.0000 mL | RESPIRATORY_TRACT | Status: DC | PRN

## 2022-04-19 MED ORDER — VENLAFAXINE HCL 75 MG PO TABS
112.5000 mg | ORAL_TABLET | Freq: Every day | ORAL | Status: DC
  Administered 2022-04-19 – 2022-04-22 (×4): 112.5 mg via ORAL
  Filled 2022-04-19 (×4): qty 1

## 2022-04-19 MED ORDER — ORAL CARE MOUTH RINSE: 15.0000 mL | OROMUCOSAL | Status: DC | PRN

## 2022-04-19 MED ORDER — FUROSEMIDE 10 MG/ML IJ SOLN
20.0000 mg | Freq: Once | INTRAMUSCULAR | Status: AC
  Administered 2022-04-19: 20 mg via INTRAVENOUS
  Filled 2022-04-19: qty 4

## 2022-04-19 MED ORDER — LATANOPROST 0.005 % OP SOLN
1.0000 [drp] | Freq: Every day | OPHTHALMIC | Status: DC
  Administered 2022-04-19 – 2022-04-21 (×3): 1 [drp] via OPHTHALMIC
  Filled 2022-04-19: qty 2.5

## 2022-04-19 MED ORDER — ACETAMINOPHEN 325 MG PO TABS
650.0000 mg | ORAL_TABLET | Freq: Four times a day (QID) | ORAL | Status: DC | PRN
  Administered 2022-04-19 – 2022-04-22 (×6): 650 mg via ORAL
  Filled 2022-04-19 (×6): qty 2

## 2022-04-19 MED ORDER — MIDAZOLAM HCL 2 MG/2ML IJ SOLN
INTRAMUSCULAR | Status: AC
  Filled 2022-04-19: qty 2

## 2022-04-19 MED ORDER — ONDANSETRON HCL 4 MG PO TABS: 4.0000 mg | ORAL_TABLET | Freq: Four times a day (QID) | ORAL | Status: DC | PRN

## 2022-04-19 MED ORDER — ONDANSETRON HCL 4 MG/2ML IJ SOLN
INTRAMUSCULAR | Status: AC | PRN
  Administered 2022-04-19: 4 mg via INTRAVENOUS

## 2022-04-19 MED ORDER — IOHEXOL 300 MG/ML  SOLN: 50.0000 mL | Freq: Once | INTRAMUSCULAR | Status: DC | PRN

## 2022-04-19 MED ORDER — HYDROMORPHONE HCL 1 MG/ML IJ SOLN
1.0000 mg | Freq: Once | INTRAMUSCULAR | Status: AC
  Administered 2022-04-19: 1 mg via INTRAVENOUS
  Filled 2022-04-19: qty 1

## 2022-04-19 MED ORDER — ARFORMOTEROL TARTRATE 15 MCG/2ML IN NEBU
15.0000 ug | INHALATION_SOLUTION | Freq: Two times a day (BID) | RESPIRATORY_TRACT | Status: DC
  Administered 2022-04-19 – 2022-04-22 (×7): 15 ug via RESPIRATORY_TRACT
  Filled 2022-04-19 (×7): qty 2

## 2022-04-19 MED ORDER — ACETAMINOPHEN 500 MG PO TABS
1000.0000 mg | ORAL_TABLET | Freq: Once | ORAL | Status: AC
  Administered 2022-04-19: 1000 mg via ORAL
  Filled 2022-04-19: qty 2

## 2022-04-19 MED ORDER — ENOXAPARIN SODIUM 40 MG/0.4ML IJ SOSY
40.0000 mg | PREFILLED_SYRINGE | INTRAMUSCULAR | Status: DC
  Administered 2022-04-19 – 2022-04-22 (×4): 40 mg via SUBCUTANEOUS
  Filled 2022-04-19 (×4): qty 0.4

## 2022-04-19 MED ORDER — PANTOPRAZOLE SODIUM 40 MG PO TBEC
40.0000 mg | DELAYED_RELEASE_TABLET | Freq: Every day | ORAL | Status: DC
  Administered 2022-04-19 – 2022-04-22 (×4): 40 mg via ORAL
  Filled 2022-04-19 (×4): qty 1

## 2022-04-19 MED ORDER — ACETAMINOPHEN 650 MG RE SUPP: 650.0000 mg | Freq: Four times a day (QID) | RECTAL | Status: DC | PRN

## 2022-04-19 MED ORDER — ALBUMIN HUMAN 25 % IV SOLN
25.0000 g | Freq: Four times a day (QID) | INTRAVENOUS | Status: DC
  Administered 2022-04-19 (×2): 25 g via INTRAVENOUS
  Filled 2022-04-19 (×2): qty 100

## 2022-04-19 MED ORDER — ONDANSETRON HCL 4 MG/2ML IJ SOLN
INTRAMUSCULAR | Status: AC
  Filled 2022-04-19: qty 2

## 2022-04-19 MED ORDER — IPRATROPIUM-ALBUTEROL 0.5-2.5 (3) MG/3ML IN SOLN
3.0000 mL | RESPIRATORY_TRACT | Status: DC
  Administered 2022-04-19: 3 mL via RESPIRATORY_TRACT
  Filled 2022-04-19: qty 3

## 2022-04-19 MED ORDER — FENTANYL CITRATE (PF) 100 MCG/2ML IJ SOLN
INTRAMUSCULAR | Status: AC
  Filled 2022-04-19: qty 2

## 2022-04-19 MED ORDER — LACTATED RINGERS IV BOLUS (SEPSIS)
1000.0000 mL | Freq: Once | INTRAVENOUS | Status: AC
  Administered 2022-04-19: 1000 mL via INTRAVENOUS

## 2022-04-19 MED ORDER — LIDOCAINE HCL 1 % IJ SOLN
INTRAMUSCULAR | Status: AC
  Administered 2022-04-19: 10 mL
  Filled 2022-04-19: qty 20

## 2022-04-19 MED ORDER — SODIUM CHLORIDE 0.9 % IV SOLN
1.0000 g | Freq: Two times a day (BID) | INTRAVENOUS | Status: AC
  Administered 2022-04-19 – 2022-04-20 (×4): 1 g via INTRAVENOUS
  Filled 2022-04-19 (×4): qty 20

## 2022-04-19 MED ORDER — VANCOMYCIN HCL 1250 MG/250ML IV SOLN: 1250.0000 mg | INTRAVENOUS | Status: DC

## 2022-04-19 MED ORDER — MIDAZOLAM HCL 2 MG/2ML IJ SOLN
INTRAMUSCULAR | Status: AC | PRN
  Administered 2022-04-19: .5 mg via INTRAVENOUS

## 2022-04-19 MED ORDER — BUDESONIDE 0.25 MG/2ML IN SUSP
0.2500 mg | Freq: Two times a day (BID) | RESPIRATORY_TRACT | Status: DC
  Administered 2022-04-19 – 2022-04-22 (×7): 0.25 mg via RESPIRATORY_TRACT
  Filled 2022-04-19 (×7): qty 2

## 2022-04-19 MED ORDER — ONDANSETRON HCL 4 MG/2ML IJ SOLN: 4.0000 mg | Freq: Four times a day (QID) | INTRAMUSCULAR | Status: DC | PRN

## 2022-04-19 MED ORDER — MONTELUKAST SODIUM 10 MG PO TABS
10.0000 mg | ORAL_TABLET | Freq: Every day | ORAL | Status: DC
  Administered 2022-04-19 – 2022-04-21 (×3): 10 mg via ORAL
  Filled 2022-04-19 (×3): qty 1

## 2022-04-19 MED ORDER — FENTANYL CITRATE (PF) 100 MCG/2ML IJ SOLN
INTRAMUSCULAR | Status: AC | PRN
  Administered 2022-04-19: 12.5 ug via INTRAVENOUS

## 2022-04-19 MED ORDER — DIPHENHYDRAMINE HCL 50 MG/ML IJ SOLN
INTRAMUSCULAR | Status: AC
  Filled 2022-04-19: qty 1

## 2022-04-19 NOTE — Progress Notes (Addendum)
Pharmacy Antibiotic Note  Adrian Rios is a 82 y.o. male admitted on 04/19/2022 with sepsis from infected kidney stone.  H/O ESBL infection. Patient received Cefepime 2gm IV x 1 @ [facility] ED.  Pharmacy has been consulted for Meropenem and Vancomycin dosing.  Plan: Meropenem 1gm IV q12h Vancomycin 1752m IV x 1 followed by 1250 mg IV Q 36 hrs. Goal AUC 400-550.  Expected AUC: 501  SCr used: 1.88 F/u culture results and sensitivities Follow renal function   Height: 5' 7"$  (170.2 cm) Weight: 81.6 kg (180 lb) IBW/kg (Calculated) : 66.1  Temp (24hrs), Avg:101.1 F (38.4 C), Min:99.6 F (37.6 C), Max:103 F (39.4 C)  Recent Labs  Lab 04/19/22 0112  WBC 11.2*  CREATININE 1.88*  LATICACIDVEN 1.8    Estimated Creatinine Clearance: 31.5 mL/min (A) (by C-G formula based on SCr of 1.88 mg/dL (H)).    Allergies  Allergen Reactions   Ivp Dye [Iodinated Contrast Media] Hives, Shortness Of Breath, Itching and Palpitations    "eyes itching,  Heart racing,  Effected breathing" 10-27-16 pt with 13 hr pre-meds for CT without any reaction-kj   Metrizamide Shortness Of Breath, Itching and Palpitations     "eyes itching,  Heart racing,  Effected breathing" 10-27-16 pt with 13 hr pre-meds for CT without any reaction-kj   Shellfish Allergy Hives, Shortness Of Breath and Itching    Mostly crab   Gabapentin Other (See Comments)    dizziness and flushing   Niacin-Lovastatin Er Other (See Comments)    Did not feel good on medication   Gadolinium Hives   Adhesive [Tape] Itching and Rash    Use paper tape    Antimicrobials this admission: 2/18 Cefepime x 1 2/18 Meropenem >>   2/18 Vancomycin >>  Dose adjustments this admission:    Microbiology results: 2/18 BCx:   2/18 UCx:       Thank you for allowing pharmacy to be a part of this patient's care.  PEverette Rank PharmD 04/19/2022 5:18 AM

## 2022-04-19 NOTE — Procedures (Signed)
Interventional Radiology Procedure Note  Date of Procedure: 04/19/2022  Procedure: IR PCN placement right   Findings:  1. PCN placement right side 10 Fr to bag drain    Complications: No immediate complications noted.   Estimated Blood Loss: minimal  Follow-up and Recommendations: 1. Per Urology    Albin Felling, MD  Vascular & Interventional Radiology  04/19/2022 12:58 PM

## 2022-04-19 NOTE — ED Provider Notes (Signed)
Patient transferred from [facility] with sepsis from infected kidney stone.  Does have a history of ESBL infections as well as ileal conduit.  Needs hospitalist admission and urology evaluation.  On arrival patient is stable but hypoxic on 3 L nasal cannula oxygen.  Does not wear oxygen at home.  He received 2.5 L of fluid at [facility] and then 900 mL in route.  Will obtain chest x-ray and begin high flow oxygen.  Did receive cefepime.  Will escalate to meropenem as this is what he received previously.  Patient improving on high flow nasal cannula of 8 L of oxygen.  Chest x-ray does show mild interstitial edema.  Dr. Alyson Ingles notified of patient's arrival via secure chat.  Hemodynamics are improving but remains tachycardic. Dr. Annie Paras to admit patient.  .Critical Care  Performed by: Ezequiel Essex, MD Authorized by: Ezequiel Essex, MD   Critical care provider statement:    Critical care time (minutes):  35   Critical care time was exclusive of:  Separately billable procedures and treating other patients   Critical care was necessary to treat or prevent imminent or life-threatening deterioration of the following conditions:  Respiratory failure and sepsis   Critical care was time spent personally by me on the following activities:  Development of treatment plan with patient or surrogate, discussions with consultants, evaluation of patient's response to treatment, examination of patient, ordering and review of laboratory studies, ordering and review of radiographic studies, ordering and performing treatments and interventions, pulse oximetry, re-evaluation of patient's condition, review of old charts, blood draw for specimens and obtaining history from patient or surrogate   I assumed direction of critical care for this patient from another provider in my specialty: no     Care discussed with: admitting provider       Ezequiel Essex, MD 04/19/22 [phone]

## 2022-04-19 NOTE — Sedation Documentation (Addendum)
Pt was given 53m of diphenhydramine per MD to counter any allergic reaction to IV dye. He appears to be sedated at this time and will monitor during this procedure for the need of additional sedative at which I will provide.

## 2022-04-19 NOTE — Progress Notes (Signed)
On 04/19/22 we received an order for this patient to have a right Nephrostomy tube placement. Per the chart, he is allergic to iodinated contrast media and gadolinium. Myself and Heide Guile spoke with Dr. Denna Haggard and made him aware of both the allergies. Dr. Denna Haggard instructed our nurse Estill Bamberg to give 23 of IV Bendaryl prior to the procedure and to continue with giving gadolinium during the procedure for the placement of a right nephrostomy tube. We were unable to document the gadolinium in our system and were unable to facility search it as well. We gave 36m of Gadavist (gadobutrol/gadolinium) 134ml/ml for the placement of the right nephrostomy tube. Patient tolerated this well. JeSherre LainT-R

## 2022-04-19 NOTE — Progress Notes (Signed)
RT placed patient on 8L salter per MD verbal order

## 2022-04-19 NOTE — ED Notes (Signed)
Pt with IR for procedure.

## 2022-04-19 NOTE — Progress Notes (Signed)
PROGRESS NOTE    Adrian Rios  [id number] DOB: Mar 26, 1940 DOA: 04/19/2022 PCP: [name], MD    Chief Complaint  Patient presents with   Flank Pain    Brief Narrative:  Patient 82 year old gentleman history of bladder cancer status post ileal conduit, chronic bronchitis unknown etiology, history of ESBL, depression presented to the ED with fever right flank pain with temperature as high as 103.  Patient seen in the ED admitted early on this morning and on presentation noted to be tachycardic with heart rates in the 130s, hypotensive with maps in the 60s, placed on volume resuscitation.  Noted to be in acute renal failure, noted to have a leukocytosis.  CT abdomen and pelvis done with a 5 mm stone in the distal right ureter with hydronephrosis.  Urinalysis done concerning for UTI.  Patient pancultured.  Patient placed empirically on IV meropenem.  Urology consulted.   Assessment & Plan:   Principal Problem:   Sepsis associated hypotension (HCC) Active Problems:   Acute pyelonephritis   Acute respiratory failure with hypoxia (HCC)   History of bladder cancer   Complicated UTI (urinary tract infection)   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HCC)   Chronic pain of both shoulders   Severe sepsis with acute organ dysfunction (HCC)   Ureteral stone with hydronephrosis   ARF (acute renal failure) (HCC)   Anemia  #1 severe sepsis secondary to acute pyelonephritis/probable ESBL UTI with some associated hypotension, POA -Patient presented with right flank pain, fever temp of 103, CBC done with a leukocytosis white count as high as 13.1 this morning.  Patient noted to be tachycardic on presentation with heart rate in the 130s.   -Urinalysis done with moderate leukocytes, positive nitrite, many bacteria, 11-20 WBCs. -Blood cultures, urine cultures pending. -Patient initially given aggressive fluid resuscitation due to hypotension with improvement with blood pressure however patient developed acute  respiratory failure with hypoxia and as such IV fluids have been discontinued and patient received a dose of Lasix. -Systolic blood pressure in the mid 90s. -Will change bed requisition to stepdown unit for closer monitoring. -Keep NPO. -IV albumin every 6 hours x 24 hours for hypotension/soft blood pressure. -Continue empiric IV Merrem, IV vancomycin. -Urology consulted and currently seen patient.  2.  Acute hypoxic respiratory failure secondary to hypovolemia in the setting of chronic bronchitis, POA -Noted in the ED to have increased O2 requirements as high as 8 L O2 after volume resuscitation per admitting physician patient looks hypervolemic on examination. -IV fluids discontinued patient given a dose of Lasix 20 mg IV x 1. -Patient also started on Pulmicort and Brovana nebs as well as DuoNebs. -Currently on 2 L nasal cannula with sats greater than 92%. -Continue empiric IV antibiotics.  3.  GERD -PPI.  4.  Depression -Continue home regimen Effexor.  5.  Acute renal failure -Likely multifactorial secondary to postrenal azotemia secondary to ureteral stone with hydronephrosis in the setting of dehydration as patient noted to be hypotensive on presentation. -Urinalysis with moderate leukocytes, positive nitrites, many bacteria, negative for protein, negative for glucose, WBC 11-20. -Patient aggressively hydrated with IV fluid however went to acute hypoxic respiratory failure and as such IV fluids discontinued and patient received a dose of Lasix. -Urology consultation pending for hydronephrosis. -Monitor renal function.  6.  Right ureteral stone with hydronephrosis -Patient presented right flank pain, fever chills, CT renal stone protocol with right ureteral stone with hydronephrosis. -Urology consultation pending.  7.  Anemia -Likely dilutional. -Patient  denies any overt bleeding. -Check an anemia panel. -Follow H&H. -Transfusion threshold hemoglobin < 8  8.  History of  bladder cancer status post ileal conduit -Outpatient follow-up with urology.   DVT prophylaxis: Lovenox Code Status: Full Family Communication: Updated patient and wife at bedside. Disposition: Likely home when clinically improved and cleared by urology.  Status is: Inpatient Remains inpatient appropriate because: Severity of illness   Consultants:  Urology: Dr. Alyson Ingles 04/19/2022  Procedures: CT renal stone protocol 04/19/2022 Chest x-ray 04/19/2022   Antimicrobials: IV cefepime 04/19/2022 x 1 IV Merrem 04/19/2022>>>> IV vancomycin 04/19/2022>>>>>   Subjective: Patient laying in [place].  Wife at bedside.  Patient states feels a little bit better than on admission.  Improved right CVA tenderness to palpation.  Less chills today.  Shortness of breath improved after receiving IV Lasix early on.  No chest pain.  No significant abdominal pain.  Currently n.p.o.  Systolic blood pressures in the 90s on telemetry.  Objective: Vitals:   04/19/22 0715 04/19/22 0806 04/19/22 0820 04/19/22 1033  BP: 101/68     Pulse: (!) 107     Resp: (!) 23     Temp:    98.6 F (37 C)  TempSrc:    Oral  SpO2: 96% 96% 96%   Weight:      Height:        Intake/Output Summary (Last 24 hours) at 04/19/2022 1037 Last data filed at 04/19/2022 1002 Gross per 24 hour  Intake 2600 ml  Output 600 ml  Net 2000 ml   Filed Weights   04/19/22 0015  Weight: 81.6 kg    Examination:  General exam: Appears calm and comfortable  Respiratory system: Clear to auscultation.  No wheezes, no rhonchi, no use of accessory muscles of respiration, speaking in full sentences. Cardiovascular system: S1 & S2 heard, RRR. No JVD, murmurs, rubs, gallops or clicks. No pedal edema. Gastrointestinal system: Abdomen is nondistended, soft and nontender. No organomegaly or masses felt. Normal bowel sounds heard.  Some right CVA TTP.  No left CVA TTP.  Ileostomy with clear urine noted. Central nervous system: Alert and oriented.  No focal neurological deficits. Extremities: Symmetric 5 x 5 power. Skin: No rashes, lesions or ulcers Psychiatry: Judgement and insight appear normal. Mood & affect appropriate.     Data Reviewed: I have personally reviewed following labs and imaging studies  CBC: Recent Labs  Lab 04/19/22 0112 04/19/22 0535  WBC 11.2* 13.1*  NEUTROABS 9.6*  --   HGB 13.0 9.0*  HCT 39.1 28.4*  MCV 92.7 99.0  PLT 280 0000000    Basic Metabolic Panel: Recent Labs  Lab 04/19/22 0112 04/19/22 0535  NA 135  --   K 3.9  --   CL 110  --   CO2 20*  --   GLUCOSE 151*  --   BUN 23  --   CREATININE 1.88* 1.60*  CALCIUM 8.5*  --     GFR: Estimated Creatinine Clearance: 37 mL/min (A) (by C-G formula based on SCr of 1.6 mg/dL (H)).  Liver Function Tests: Recent Labs  Lab 04/19/22 0112  AST 30  ALT 28  ALKPHOS 108  BILITOT 0.5  PROT 7.0  ALBUMIN 3.4*    CBG: No results for input(s): "GLUCAP" in the last 168 hours.   Recent Results (from the past 240 hour(s))  Blood Culture (routine x 2)     Status: None (Preliminary result)   Collection Time: 04/19/22  1:00 AM   Specimen: Right  Antecubital; Blood  Result Value Ref Range Status   Specimen Description   Final    RIGHT ANTECUBITAL BLOOD Performed at [facility] [address], [place], [place] [address]    Special Requests   Final    BOTTLES DRAWN AEROBIC AND ANAEROBIC Blood Culture adequate volume Performed at [facility], [address] [place], [name] [address]    Culture PENDING  Incomplete   Report Status PENDING  Incomplete  Resp panel by RT-PCR (RSV, Flu A&B, Covid) Anterior Nasal Swab     Status: None   Collection Time: 04/19/22  1:13 AM   Specimen: Anterior Nasal Swab  Result Value Ref Range Status   SARS Coronavirus 2 by RT PCR NEGATIVE NEGATIVE Final    Comment: (NOTE) SARS-CoV-2 target nucleic acids are NOT DETECTED.  The SARS-CoV-2 RNA is generally detectable in upper respiratory specimens  during the acute phase of infection. The lowest concentration of SARS-CoV-2 viral copies this assay can detect is 138 copies/mL. A negative result does not preclude SARS-Cov-2 infection and should not be used as the sole basis for treatment or other patient management decisions. A negative result may occur with  improper specimen collection/handling, submission of specimen other than nasopharyngeal swab, presence of viral mutation(s) within the areas targeted by this assay, and inadequate number of viral copies(<138 copies/mL). A negative result must be combined with clinical observations, patient history, and epidemiological information. The expected result is Negative.  Fact Sheet for Patients:  EntrepreneurPulse.com.au  Fact Sheet for Healthcare Providers:  IncredibleEmployment.be  This test is no t yet approved or cleared by the [place] FDA and  has been authorized for detection and/or diagnosis of SARS-CoV-2 by FDA under an Emergency Use Authorization (EUA). This EUA will remain  in effect (meaning this test can be used) for the duration of the COVID-19 declaration under Section 564(b)(1) of the Act, 21 U.S.C.section 360bbb-3(b)(1), unless the authorization is terminated  or revoked sooner.       Influenza A by PCR NEGATIVE NEGATIVE Final   Influenza B by PCR NEGATIVE NEGATIVE Final    Comment: (NOTE) The Xpert Xpress SARS-CoV-2/FLU/RSV plus assay is intended as an aid in the diagnosis of influenza from Nasopharyngeal swab specimens and should not be used as a sole basis for treatment. Nasal washings and aspirates are unacceptable for Xpert Xpress SARS-CoV-2/FLU/RSV testing.  Fact Sheet for Patients: EntrepreneurPulse.com.au  Fact Sheet for Healthcare Providers: IncredibleEmployment.be  This test is not yet approved or cleared by the [place] FDA and has been authorized for detection  and/or diagnosis of SARS-CoV-2 by FDA under an Emergency Use Authorization (EUA). This EUA will remain in effect (meaning this test can be used) for the duration of the COVID-19 declaration under Section 564(b)(1) of the Act, 21 U.S.C. section 360bbb-3(b)(1), unless the authorization is terminated or revoked.     Resp Syncytial Virus by PCR NEGATIVE NEGATIVE Final    Comment: (NOTE) Fact Sheet for Patients: EntrepreneurPulse.com.au  Fact Sheet for Healthcare Providers: IncredibleEmployment.be  This test is not yet approved or cleared by the [place] FDA and has been authorized for detection and/or diagnosis of SARS-CoV-2 by FDA under an Emergency Use Authorization (EUA). This EUA will remain in effect (meaning this test can be used) for the duration of the COVID-19 declaration under Section 564(b)(1) of the Act, 21 U.S.C. section 360bbb-3(b)(1), unless the authorization is terminated or revoked.  Performed at [facility], [address] [place], [place] [address]          Radiology Studies: DG Chest Portable 1 View  Result Date: 04/19/2022 CLINICAL DATA:  Hypoxia.  Right flank pain. EXAM: PORTABLE CHEST 1 VIEW COMPARISON:  06/13/2021. FINDINGS: Stable cardiomediastinal contours. Lung volumes are low. No pleural effusion or edema. No airspace opacities. Previous left shoulder arthroplasty. IMPRESSION: Low lung volumes.  No acute abnormality. Electronically Signed   By: Kerby Moors M.D.   On: 04/19/2022 06:00   CT Renal Stone Study  Result Date: 04/19/2022 CLINICAL DATA:  Flank pain on the right for 2 days, initial encounter EXAM: CT ABDOMEN AND PELVIS WITHOUT CONTRAST TECHNIQUE: Multidetector CT imaging of the abdomen and pelvis was performed following the standard protocol without IV contrast. RADIATION DOSE REDUCTION: This exam was performed according to the departmental dose-optimization program which includes automated  exposure control, adjustment of the mA and/or kV according to patient size and/or use of iterative reconstruction technique. COMPARISON:  10/09/2021 FINDINGS: Lower chest: No acute abnormality. Hepatobiliary: No focal liver abnormality is seen. No gallstones, gallbladder wall thickening, or biliary dilatation. Pancreas: Unremarkable. No pancreatic ductal dilatation or surrounding inflammatory changes. Spleen: Normal in size without focal abnormality. Adrenals/Urinary Tract: Adrenal glands are within normal limits. Kidneys are well visualized bilaterally. Stable left renal cyst is noted which is simple in nature. No follow-up is recommended. Bilateral hydronephrosis is noted right greater than left. The left hydronephrosis is stable from the prior study. Previously seen nonobstructing right renal stone has now migrated into the more distal ureter. This is best seen on image number 55 of series 2 and 49 of series 5. This accounts for the increased hydronephrosis and right-sided flank pain. No obstructive changes are noted on the left. Ileal conduit is noted in the right lower quadrant. The bladder has been surgically removed. Stomach/Bowel: Scattered diverticular changes noted in the colon without evidence of diverticulitis. The appendix has been surgically removed. No obstructive or inflammatory changes of the small bowel are noted. Stomach is within normal limits. Vascular/Lymphatic: Aortic atherosclerosis. No enlarged abdominal or pelvic lymph nodes. Reproductive: Prostate has been surgically removed as well. Other: No abdominal wall hernia or abnormality. No abdominopelvic ascites. Musculoskeletal: Degenerative changes of lumbar spine are noted. IMPRESSION: Interval migration of a 5 mm stone from the right kidney into the distal right ureter with increased hydronephrosis. Diverticulosis without diverticulitis. Changes of prior cystectomy with ileal conduit placement Electronically Signed   By: Inez Catalina M.D.    On: 04/19/2022 00:55        Scheduled Meds:  arformoterol  15 mcg Nebulization BID   budesonide (PULMICORT) nebulizer solution  0.25 mg Nebulization BID   enoxaparin (LOVENOX) injection  40 mg Subcutaneous Q24H   latanoprost  1 drop Right Eye QHS   magnesium oxide  400 mg Oral Q supper   montelukast  10 mg Oral QHS   pantoprazole  40 mg Oral Daily   venlafaxine  112.5 mg Oral Daily   Continuous Infusions:  albumin human     meropenem (MERREM) IV Stopped (04/19/22 0900)   [START ON 04/20/2022] vancomycin       LOS: 0 days    Time spent: 40 minutes    Irine Seal, MD [facility]   To contact the attending provider between 7A-7P or the covering provider during after hours 7P-7A, please log into the web site [email or web address] and access using universal [place] password for that web site. If you do not have the password, please call  the hospital operator.  04/19/2022, 10:37 AM

## 2022-04-19 NOTE — H&P (Signed)
History and Physical    Adrian Rios [id number] DOB: March 06, 1940 DOA: 04/19/2022  PCP: [name], MD   Chief Complaint: flank pain, fever  HPI: Adrian Rios is a 82 y.o. male with medical history significant of bladder cancer status post ileal conduit, chronic bronchitis unknown etiology the, history of ESBL, depression who presented to the emergency department with flank pain and fever.  Patient states that symptoms started around 8 PM last night when he developed right flank pain and fever up to 103.  He presented to the emergency department for further assessment.  At presentation he was found to be tachycardic in the 130s and hypotensive with maps in the 60s.  He was given volume resuscitation and started on broad-spectrum antibiotics.  Labs were obtained which were notable for lactic acid 1.8, creatinine 1.8 baseline around 1, bicarb 20, WBC 11.2, hemoglobin 13, INR 1.1, respiratory viral panel negative, urinalysis concerning for infection.  CT abdomen pelvis without contrast demonstrated 5 mm stone in the distal right ureter with hydronephrosis.  Chest x-ray showed low lung volumes with no acute abnormalities.  Patient was admitted for further workup.  On admission patient states that he had a cystoscopy prostatectomy in 2018.  He had issues with recurrent UTI and his ileal conduit and takes methenamine to prevent infection.  Prior ESBL UTIs required treatment with ertapenem and meropenem.   Review of Systems: Review of Systems  All other systems reviewed and are negative.    As per HPI otherwise 10 point review of systems negative.   Allergies  Allergen Reactions   Ivp Dye [Iodinated Contrast Media] Hives, Shortness Of Breath, Itching and Palpitations    "eyes itching,  Heart racing,  Effected breathing" 10-27-16 pt with 13 hr pre-meds for CT without any reaction-kj   Metrizamide Shortness Of Breath, Itching and Palpitations     "eyes itching,  Heart racing,  Effected  breathing" 10-27-16 pt with 13 hr pre-meds for CT without any reaction-kj   Shellfish Allergy Hives, Shortness Of Breath and Itching    Mostly crab   Gabapentin Other (See Comments)    dizziness and flushing   Niacin-Lovastatin Er Other (See Comments)    Did not feel good on medication   Gadolinium Hives   Adhesive [Tape] Itching and Rash    Use paper tape    Past Medical History:  Diagnosis Date   Allergic rhinitis    Arthritis    Benign localized prostatic hyperplasia with lower urinary tract symptoms (LUTS)    Bladder tumor    Borderline glaucoma of right eye    Cancer (HCC)    bladder and prostate   Carotid bruit    per duplex 03-11-2016 RICA 1-39%   Chronic throat clearing    COVID-19    DDD (degenerative disc disease), lumbar    Depression    ED (erectile dysfunction)    Elevated PSA    urologist-  dr [name]--- s/p  prostate bx's   GERD (gastroesophageal reflux disease)    Hematuria    History of chronic bronchitis    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    s/p  MOH's nasal --  pre-melanoma   History of squamous cell carcinoma excision    several times   Hyperlipidemia    Pre-diabetes    Premature ventricular contractions (PVCs) (VPCs)    RAD (reactive airway disease)    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[place])    dx 1970's  s/p  deep  neck lymph node bx's and lung bx's   Sensorineural hearing loss (SNHL) of both ears    Sepsis ([place]) 09/2016   Ureterolithiasis    UTI (urinary tract infection) 08/2016   Wears glasses    Wears hearing aid    bilateral    Past Surgical History:  Procedure Laterality Date   APPENDECTOMY  2008   CARDIOVASCULAR STRESS TEST  05/27/2010   normal nuclear study w/ no ischemia/  normal LV function and wall motion , ef 60%   CYSTOSCOPY WITH INJECTION N/A 06/12/2016   Procedure: CYSTOSCOPY WITH INJECTION OF INDOCYANINE GREEN DYE;  Surgeon: Alexis Frock, MD;  Location: [facility];  Service: Urology;  Laterality: N/A;   DEEP  NECK LYMPH NODE BIOPSY / EXCISION  1970's   and Bronchoscopy w/ bx's ( dx Sarcoidosis)   ILEOSTOMY     IR NEPHROSTOMY PLACEMENT LEFT  11/18/2016   MOHS SURGERY  2013 approx.    nasal; pre melanoma   PARS PLANA VITRECTOMY Right 2007   repair macular pucker   TONSILLECTOMY AND ADENOIDECTOMY  child   TRANSURETHRAL RESECTION OF BLADDER TUMOR N/A 04/06/2016   Procedure: CYSTOSCOPY TRANSURETHRAL RESECTION OF BLADDER TUMORS (TURBT);  Surgeon: Carolan Clines, MD;  Location: [facility];  Service: Urology;  Laterality: N/A;   TRANSURETHRAL RESECTION OF PROSTATE       reports that he has never smoked. He has never used smokeless tobacco. He reports that he does not drink alcohol and does not use drugs.  Family History  Problem Relation Age of Onset   Stroke Mother        in her 32s   Breast cancer Sister    Heart disease Maternal Grandmother 86       MI    Breast cancer Paternal Grandmother    Heart disease Paternal Grandfather 26       MI   Stroke Brother    Healthy Father    Diabetes Neg Hx     Prior to Admission medications   Medication Sig Start Date End Date Taking? Authorizing Provider  acetaminophen (TYLENOL) 500 MG tablet Take 1,000 mg by mouth as needed for headache (pain).   Yes [provider]  ADVAIR DISKUS 250-50 MCG/ACT AEPB INHALE 1 PUFF INTO THE LUNGS EVERY 12 HOURS Patient taking differently: Inhale 1 puff into the lungs daily. 12/11/21  Yes Burns, Claudina Lick, MD  albuterol (VENTOLIN HFA) 108 (90 Base) MCG/ACT inhaler Inhale 2 puffs into the lungs every 6 (six) hours as needed for wheezing or shortness of breath. Patient taking differently: Inhale 1-2 puffs into the lungs every 6 (six) hours as needed for wheezing or shortness of breath. 02/01/20  Yes Burns, Claudina Lick, MD  Ascorbic Acid (VITAMIN C) 1000 MG tablet Take 1,000 mg by mouth daily.   Yes [provider]  cholecalciferol (VITAMIN D3) 25 MCG (1000 UNIT) tablet Take 1,000 Units by  mouth daily.   Yes [provider]  Cinnamon 500 MG capsule Take 500 mg by mouth daily.   Yes [provider]  latanoprost (XALATAN) 0.005 % ophthalmic solution Place 1 drop into the right eye at bedtime. 05/06/20  Yes [provider]  magnesium oxide (MAG-OX) 400 MG tablet Take 400 mg by mouth daily with supper.   Yes [provider]  methenamine (HIPREX) 1 g tablet Take 1 tablet (1 g total) by mouth 2 (two) times daily with a meal. 06/21/21  Yes Mercy Riding, MD  Misc Natural Products (  GLUCOSAMINE CHOND DOUBLE STR PO) Take 1 tablet by mouth 2 (two) times daily.   Yes [provider]  montelukast (SINGULAIR) 10 MG tablet TAKE ONE TABLET BY MOUTH EVERY NIGHT AT BEDTIME 10/30/21  Yes Burns, Claudina Lick, MD  triamcinolone (NASACORT) 55 MCG/ACT AERO nasal inhaler Place 2 sprays into the nose at bedtime as needed (allergies).   Yes [provider]  venlafaxine (EFFEXOR) 75 MG tablet TAKE 1 AND 1/2 TABLET BY MOUTH DAILY 01/14/22  Yes Burns, Claudina Lick, MD  omeprazole (PRILOSEC) 40 MG capsule Take 1 capsule (40 mg total) by mouth daily before breakfast. Patient not taking: Reported on 04/19/2022 12/15/21   Levin Erp, [place]    Physical Exam: Vitals:   04/19/22 0451 04/19/22 0452 04/19/22 0456 04/19/22 0500  BP: 108/72 108/72  116/71  Pulse: (!) 108 (!) 110  (!) 110  Resp: 18   (!) 21  Temp:   99.6 F (37.6 C)   TempSrc:   Oral   SpO2: 96%   (!) 89%  Weight:      Height:       Physical Exam Vitals reviewed.  Constitutional:      Appearance: He is normal weight.  HENT:     Head: Normocephalic.     Nose: Nose normal.     Mouth/Throat:     Mouth: Mucous membranes are moist.  Eyes:     Pupils: Pupils are equal, round, and reactive to light.  Cardiovascular:     Rate and Rhythm: Regular rhythm. Tachycardia present.     Pulses: Normal pulses.     Heart sounds: Normal heart sounds.  Pulmonary:     Effort: Pulmonary effort is normal.      Breath sounds: Normal breath sounds.  Abdominal:     General: Abdomen is flat. Bowel sounds are normal.  Musculoskeletal:        General: Normal range of motion.     Cervical back: Normal range of motion.  Skin:    General: Skin is warm.     Capillary Refill: Capillary refill takes less than 2 seconds.  Neurological:     General: No focal deficit present.     Mental Status: He is alert. Mental status is at baseline.       Labs on Admission: I have personally reviewed the patients's labs and imaging studies.  Assessment/Plan Principal Problem:   Sepsis associated hypotension (HCC)   # Severe sepsis secondary to ESBL UTI, POA, active # Acute kidney injury secondary to UTI -Prior cultures show resistance to cefepime and Zosyn - Patient presented with fever, leukocytosis and acute kidney injury Plan: Vancomycin and meropenem Trend creatinine Urology consulted for hydronephrosis for possible cystoscopy versus percutaneous nephrostomy tube  #Acute hypoxic respiratory failure secondary to hypervolemia and/or chronic underlying bronchitis, POA, active - Patient requiring 8 L of oxygen after volume resuscitation - Appears hypervolemic on exam Plan: Diuresis with 20 mg IV Lasix Continue broad-spectrum antibiotics Start Pulmicort and Brovana as well as DuoNebs  Depression-continue Effexor  GERD-continue Protonix  Admit in intermediate care due to severe spsis and new oxygen requiremnt up to 8 L  Critical care time: 75 min.   Admission status: Inpatient Progressive  Certification: The appropriate patient status for this patient is INPATIENT. Inpatient status is judged to be reasonable and necessary in order to provide the required intensity of service to ensure the patient's safety. The patient's presenting symptoms, physical exam findings, and initial radiographic and laboratory data in the  context of their chronic comorbidities is felt to place them at high risk for further  clinical deterioration. Furthermore, it is not anticipated that the patient will be medically stable for discharge from the hospital within 2 midnights of admission.   * I certify that at the point of admission it is my clinical judgment that the patient will require inpatient hospital care spanning beyond 2 midnights from the point of admission due to high intensity of service, high risk for further deterioration and high frequency of surveillance required.Emilee Hero MD [facility] If 7PM-7AM, please contact night-coverage [email or web address]  04/19/2022, 6:17 AM

## 2022-04-19 NOTE — Consult Note (Signed)
Chief Complaint: Patient was seen in consultation today for  Chief Complaint  Patient presents with   Flank Pain   Referring Physician(s): Dr. Alyson Ingles   Supervising Physician: Juliet Rude  Patient Status: [facility] - ED  History of Present Illness: Adrian Rios is an 82 y.o. male with a medical history significant for bladder cancer s/p radical cystectomy, radical prostatectomy and open ileal conduit diversion (2018), chronic bronchitis, recurrent ESBL UTIs, depression and prior placement of a left percutaneous nephrostomy tube in IR 11/18/2016 for left hydronephrosis. He presented to the [facility] ED 04/19/22 with complaints of flank pain and fever. He was found to be tachycardic, hypotensive and febrile. Imaging showed a 5 mm kidney stone in the distal right ureter with hydronephrosis.    CT Renal Stone Study 04/19/22 Adrenals/Urinary Tract: Adrenal glands are within normal limits. Kidneys are well visualized bilaterally. Stable left renal cyst is noted which is simple in nature. No follow-up is recommended. Bilateral hydronephrosis is noted right greater than left. The left hydronephrosis is stable from the prior study. Previously seen nonobstructing right renal stone has now migrated into the more distal ureter. This is best seen on image number 55 of series 2 and 49 of series 5. This accounts for the increased hydronephrosis and right-sided flank pain. No obstructive changes are noted on the left. Ileal conduit is noted in the right lower quadrant. The bladder has been surgically removed. IMPRESSION:  1. Interval migration of a 5 mm stone from the right kidney into the distal right ureter with increased hydronephrosis. 2. Diverticulosis without diverticulitis. 3. Changes of prior cystectomy with ileal conduit placement  Urology was consulted and the recommendation was made for right percutaneous nephrostomy tube placement for renal decompression and potential surgical access for  definitive stone treatment.   Interventional Radiology has been asked to evaluate this patient for an image-guided right percutaneous nephrostomy tube. Imaging reviewed and procedure approved by Dr. Denna Haggard.   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    s/p  MOH's nasal --  pre-melanoma   History of squamous cell carcinoma excision    several times   Hyperlipidemia    Pre-diabetes    Premature ventricular contractions (PVCs) (VPCs)    RAD (reactive airway disease)    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  s/p  deep neck lymph node bx's and lung bx's   Sensorineural hearing loss (SNHL) of both ears    Sepsis ([place]) 09/2016   Ureterolithiasis    UTI (urinary tract infection) 08/2016   Wears glasses    Wears hearing aid    bilateral    Past Surgical History:  Procedure Laterality Date   APPENDECTOMY  2008   CARDIOVASCULAR STRESS TEST  05/27/2010   normal nuclear study w/ no ischemia/  normal LV function and wall motion , ef 60%   CYSTOSCOPY WITH INJECTION N/A 06/12/2016   Procedure: CYSTOSCOPY WITH INJECTION OF INDOCYANINE GREEN DYE;  Surgeon: Alexis Frock, MD;  Location: [facility];  Service: Urology;  Laterality: N/A;   DEEP NECK LYMPH NODE BIOPSY / EXCISION  1970's   and Bronchoscopy w/ bx's ( dx Sarcoidosis)   ILEOSTOMY     IR NEPHROSTOMY PLACEMENT LEFT  11/18/2016   MOHS  SURGERY  2013 approx.    nasal; pre melanoma   PARS PLANA VITRECTOMY Right 2007   repair macular pucker   TONSILLECTOMY AND ADENOIDECTOMY  child   TRANSURETHRAL RESECTION OF BLADDER TUMOR N/A 04/06/2016   Procedure: CYSTOSCOPY TRANSURETHRAL RESECTION OF BLADDER TUMORS (TURBT);  Surgeon: Carolan Clines, MD;  Location: [facility];  Service: Urology;  Laterality: N/A;   TRANSURETHRAL RESECTION OF PROSTATE      Allergies: Ivp dye [iodinated contrast media], Metrizamide, Shellfish allergy, Gabapentin, Niacin-lovastatin er, Gadolinium, and Adhesive [tape]  Medications: Prior to Admission medications   Medication Sig Start Date End Date Taking? Authorizing Provider  acetaminophen (TYLENOL) 500 MG tablet Take 1,000 mg by mouth as needed for headache (pain).   Yes [provider]  ADVAIR DISKUS 250-50 MCG/ACT AEPB INHALE 1 PUFF INTO THE LUNGS EVERY 12 HOURS Patient taking differently: Inhale 1 puff into the lungs daily. 12/11/21  Yes Burns, Claudina Lick, MD  albuterol (VENTOLIN HFA) 108 (90 Base) MCG/ACT inhaler Inhale 2 puffs into the lungs every 6 (six) hours as needed for wheezing or shortness of breath. Patient taking differently: Inhale 1-2 puffs into the lungs every 6 (six) hours as needed for wheezing or shortness of breath. 02/01/20  Yes Burns, Claudina Lick, MD  Ascorbic Acid (VITAMIN C) 1000 MG tablet Take 1,000 mg by mouth daily.   Yes [provider]  cholecalciferol (VITAMIN D3) 25 MCG (1000 UNIT) tablet Take 1,000 Units by mouth daily.   Yes [provider]  Cinnamon 500 MG capsule Take 500 mg by mouth daily.   Yes [provider]  latanoprost (XALATAN) 0.005 % ophthalmic solution Place 1 drop into the right eye at bedtime. 05/06/20  Yes [provider]  magnesium oxide (MAG-OX) 400 MG tablet Take 400 mg by mouth daily with supper.   Yes [provider]  methenamine (HIPREX) 1 g tablet Take 1 tablet (1 g total) by mouth 2 (two)  times daily with a meal. 06/21/21  Yes Gonfa, Charlesetta Ivory, MD  Misc Natural Products (GLUCOSAMINE CHOND DOUBLE STR PO) Take 1 tablet by mouth 2 (two) times daily.   Yes [provider]  montelukast (SINGULAIR) 10 MG tablet TAKE ONE TABLET BY MOUTH EVERY NIGHT AT BEDTIME 10/30/21  Yes Burns, Claudina Lick, MD  triamcinolone (NASACORT) 55 MCG/ACT AERO nasal inhaler Place 2 sprays into the nose at bedtime as needed (allergies).   Yes [provider]  venlafaxine (EFFEXOR) 75 MG tablet TAKE 1 AND 1/2 TABLET BY MOUTH DAILY 01/14/22  Yes Burns, Claudina Lick, MD  omeprazole (PRILOSEC) 40 MG capsule Take 1 capsule (40 mg total) by mouth daily before breakfast. Patient not taking: Reported on 04/19/2022 12/15/21   Levin Erp, PA     Family History  Problem Relation Age of Onset   Stroke Mother        in her 67s   Breast cancer Sister    Heart disease Maternal Grandmother 52  MI    Breast cancer Paternal Grandmother    Heart disease Paternal Grandfather 55       MI   Stroke Brother    Healthy Father    Diabetes Neg Hx     Social History   Socioeconomic History   Marital status: Married    Spouse name: Not on file   Number of children: 2   Years of education: Not on file   Highest education level: Not on file  Occupational History   Occupation: retired  Tobacco Use   Smoking status: Never   Smokeless tobacco: Never  Vaping Use   Vaping Use: Never used  Substance and Sexual Activity   Alcohol use: No   Drug use: No   Sexual activity: Not Currently  Other Topics Concern   Not on file  Social History Narrative   Not on file   Social Determinants of Health   Financial Resource Strain: Low Risk  (11/01/2020)   Overall Financial Resource Strain (CARDIA)    Difficulty of Paying Living Expenses: Not hard at all  Food Insecurity: No Food Insecurity (09/24/2021)   Hunger Vital Sign    Worried About Running Out of Food in the Last Year: Never true    [place] in  the Last Year: Never true  Transportation Needs: No Transportation Needs (09/24/2021)   PRAPARE - Hydrologist (Medical): No    Lack of Transportation (Non-Medical): No  Physical Activity: Sufficiently Active (11/01/2020)   Exercise Vital Sign    Days of Exercise per Week: 5 days    Minutes of Exercise per Session: 30 min  Stress: No Stress Concern Present (11/01/2020)   [place]    Feeling of Stress : Not at all  Social Connections: Socially Integrated (11/01/2020)   Social Connection and Isolation Panel [NHANES]    Frequency of Communication with Friends and Family: More than three times a week    Frequency of Social Gatherings with Friends and Family: More than three times a week    Attends Religious Services: More than 4 times per year    Active Member of [employer] or Organizations: Yes    Attends Music therapist: More than 4 times per year    Marital Status: Married    Review of Systems: A 12 point ROS discussed and pertinent positives are indicated in the HPI above.  All other systems are negative.  Review of Systems  Constitutional:  Positive for fatigue. Negative for appetite change.  Respiratory:  Negative for cough and shortness of breath.   Cardiovascular:  Negative for chest pain and leg swelling.  Gastrointestinal:  Negative for diarrhea, nausea and vomiting.  Genitourinary:  Negative for flank pain.  Musculoskeletal:  Negative for back pain.  Neurological:  Positive for headaches. Negative for dizziness.    Vital Signs: BP 101/68   Pulse (!) 107   Temp 98.6 F (37 C) (Oral)   Resp (!) 23   Ht 5' 7"$  (1.702 m)   Wt 180 lb (81.6 kg)   SpO2 96%   BMI 28.19 kg/m   Physical Exam Constitutional:      General: He is not in acute distress.    Appearance: He is not ill-appearing.  HENT:     Mouth/Throat:     Mouth: Mucous membranes are moist.     Pharynx:  Oropharynx is clear.  Cardiovascular:     Rate  and Rhythm: Regular rhythm. Tachycardia present.     Pulses: Normal pulses.     Heart sounds: Normal heart sounds.  Pulmonary:     Effort: Pulmonary effort is normal.     Breath sounds: Normal breath sounds.  Abdominal:     General: Bowel sounds are normal.     Palpations: Abdomen is soft.     Tenderness: There is no abdominal tenderness.  Genitourinary:    Comments: Ileal conduit with clear yellow urine in ostomy bag.  Musculoskeletal:     Right lower leg: No edema.     Left lower leg: No edema.  Skin:    General: Skin is warm and dry.  Neurological:     Mental Status: He is alert and oriented to person, place, and time.  Psychiatric:        Mood and Affect: Mood normal.        Behavior: Behavior normal.        Thought Content: Thought content normal.        Judgment: Judgment normal.     Imaging: DG Chest Portable 1 View  Result Date: 04/19/2022 CLINICAL DATA:  Hypoxia.  Right flank pain. EXAM: PORTABLE CHEST 1 VIEW COMPARISON:  06/13/2021. FINDINGS: Stable cardiomediastinal contours. Lung volumes are low. No pleural effusion or edema. No airspace opacities. Previous left shoulder arthroplasty. IMPRESSION: Low lung volumes.  No acute abnormality. Electronically Signed   By: Kerby Moors M.D.   On: 04/19/2022 06:00   CT Renal Stone Study  Result Date: 04/19/2022 CLINICAL DATA:  Flank pain on the right for 2 days, initial encounter EXAM: CT ABDOMEN AND PELVIS WITHOUT CONTRAST TECHNIQUE: Multidetector CT imaging of the abdomen and pelvis was performed following the standard protocol without IV contrast. RADIATION DOSE REDUCTION: This exam was performed according to the departmental dose-optimization program which includes automated exposure control, adjustment of the mA and/or kV according to patient size and/or use of iterative reconstruction technique. COMPARISON:  10/09/2021 FINDINGS: Lower chest: No acute abnormality.  Hepatobiliary: No focal liver abnormality is seen. No gallstones, gallbladder wall thickening, or biliary dilatation. Pancreas: Unremarkable. No pancreatic ductal dilatation or surrounding inflammatory changes. Spleen: Normal in size without focal abnormality. Adrenals/Urinary Tract: Adrenal glands are within normal limits. Kidneys are well visualized bilaterally. Stable left renal cyst is noted which is simple in nature. No follow-up is recommended. Bilateral hydronephrosis is noted right greater than left. The left hydronephrosis is stable from the prior study. Previously seen nonobstructing right renal stone has now migrated into the more distal ureter. This is best seen on image number 55 of series 2 and 49 of series 5. This accounts for the increased hydronephrosis and right-sided flank pain. No obstructive changes are noted on the left. Ileal conduit is noted in the right lower quadrant. The bladder has been surgically removed. Stomach/Bowel: Scattered diverticular changes noted in the colon without evidence of diverticulitis. The appendix has been surgically removed. No obstructive or inflammatory changes of the small bowel are noted. Stomach is within normal limits. Vascular/Lymphatic: Aortic atherosclerosis. No enlarged abdominal or pelvic lymph nodes. Reproductive: Prostate has been surgically removed as well. Other: No abdominal wall hernia or abnormality. No abdominopelvic ascites. Musculoskeletal: Degenerative changes of lumbar spine are noted. IMPRESSION: Interval migration of a 5 mm stone from the right kidney into the distal right ureter with increased hydronephrosis. Diverticulosis without diverticulitis. Changes of prior cystectomy with ileal conduit placement Electronically Signed   By: Inez Catalina M.D.   On: 04/19/2022 00:55  Labs:  CBC: Recent Labs    10/10/21 0505 10/11/21 0420 04/19/22 0112 04/19/22 0535  WBC 11.3* 10.5 11.2* 13.1*  HGB 11.7* 11.8* 13.0 9.0*  HCT 36.5* 36.4*  39.1 28.4*  PLT 300 290 280 178    COAGS: Recent Labs    06/13/21 2349 04/19/22 0112  INR 1.1 1.1  APTT 34 35    BMP: Recent Labs    10/09/21 2030 10/10/21 0505 10/11/21 0420 04/19/22 0112 04/19/22 0535  NA 136 142 143 135  --   K 3.6 3.5 3.8 3.9  --   CL 108 115* 114* 110  --   CO2 20* 22 24 20*  --   GLUCOSE 120* 109* 106* 151*  --   BUN 18 15 13 23  $ --   CALCIUM 8.4* 8.0* 8.6* 8.5*  --   CREATININE 1.16 1.17 0.97 1.88* 1.60*  GFRNONAA >60 >60 >60 35* 43*    LIVER FUNCTION TESTS: Recent Labs    06/15/21 0330 06/16/21 0325 07/23/21 1832 10/09/21 2030 04/19/22 0112  BILITOT 0.7  --  0.4 0.5 0.5  AST 15  --  21 17 30  $ ALT 14  --  15 13 28  $ ALKPHOS 78  --  91 94 108  PROT 6.4*  --  7.5 7.2 7.0  ALBUMIN 3.0* 3.0* 3.6 3.5 3.4*    TUMOR MARKERS: No results for input(s): "AFPTM", "CEA", "CA199", "CHROMGRNA" in the last 8760 hours.  Assessment and Plan:  Right renal calculi with right hydronephrosis: Adrian Rios, 82 year old male, presents today to the [facility] department for an image-guided right percutaneous nephrostomy tube. The procedure was discussed with the patient and his daughter in the ED.   Risks and benefits of Right PCN placement were discussed with the patient including, but not limited to, infection, bleeding, significant bleeding causing loss or decrease in renal function or damage to adjacent structures.   All of the patient's questions were answered, patient is agreeable to proceed. He has been NPO. Last dose of subcutaneous 40 mg lovenox was today at 1010. Patient is a full code.   Consent signed and in chart and in IR   Thank you for this interesting consult.  I greatly enjoyed meeting Adrian Rios and look forward to participating in their care.  A copy of this report was sent to the requesting provider on this date.  Electronically Signed: Soyla Dryer, AGACNP-BC [phone] 04/19/2022, 11:24 AM   I  spent a total of 20 Minutes    in face to face in clinical consultation, greater than 50% of which was counseling/coordinating care for right PCN.

## 2022-04-19 NOTE — Sepsis Progress Note (Signed)
[facility] following code sepsis °

## 2022-04-19 NOTE — ED Notes (Signed)
[facility] called for transport to [facility] ED.

## 2022-04-19 NOTE — Consult Note (Signed)
Urology Consult  Referring physician: Dr. Grandville Silos Reason for referral: right ureteral calculus, sepsis  Chief Complaint: Right flank pain  History of Present Illness: Adrian Rios is a 82yo with a history of bladder cancer treated with radical cystectomy and nephrolithiasis who presented to the ER this morning with a 1 day history of right flank pain and fevers over 101. He is followup at [facility] for his bladder cancer and nephrolithiasis. CT scan obtained in the ER shows a 5-89m right distal ureteral calculus with moderate hydronephrosis. WBC count 13.1, creatinine 1.6. Temperature 103. He is tachycardic and hypotensive. Currently he denies nay flank pain after he received narcotics. NO other associated symptoms. No exacerbating/alleviating events.   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    s/p  MOH's nasal --  pre-melanoma   History of squamous cell carcinoma excision    several times   Hyperlipidemia    Pre-diabetes    Premature ventricular contractions (PVCs) (VPCs)    RAD (reactive airway disease)    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  s/p  deep neck lymph node bx's and lung bx's   Sensorineural hearing loss (SNHL) of both ears    [place]   Ureterolithiasis    UTI (urinary tract infection) 08/2016   Wears glasses    Wears hearing aid    bilateral   Past Surgical  History:  Procedure Laterality Date   APPENDECTOMY  2008   CARDIOVASCULAR STRESS TEST  05/27/2010   normal nuclear study w/ no ischemia/  normal LV function and wall motion , ef 60%   CYSTOSCOPY WITH INJECTION N/A 06/12/2016   Procedure: CYSTOSCOPY WITH INJECTION OF INDOCYANINE GREEN DYE;  Surgeon: TAlexis Frock MD;  Location: [facility];  Service: Urology;  Laterality: N/A;   DEEP NECK LYMPH NODE BIOPSY / EXCISION  1970's   and Bronchoscopy w/ bx's ( dx Sarcoidosis)   ILEOSTOMY     IR NEPHROSTOMY PLACEMENT LEFT  11/18/2016   IR NEPHROSTOMY PLACEMENT RIGHT  04/19/2022   MOHS SURGERY  2013 approx.    nasal; pre melanoma   PARS PLANA VITRECTOMY Right 2007   repair macular pucker   TONSILLECTOMY AND ADENOIDECTOMY  child   TRANSURETHRAL RESECTION OF BLADDER TUMOR N/A 04/06/2016   Procedure: CYSTOSCOPY TRANSURETHRAL RESECTION OF BLADDER TUMORS (TURBT);  Surgeon: SCarolan Clines MD;  [facility]  Service: Urology;  Laterality: N/A;   TRANSURETHRAL RESECTION OF PROSTATE      Medications: I have reviewed the patient's current medications. Allergies:  Allergies  Allergen Reactions   Ivp Dye [Iodinated Contrast Media] Hives, Shortness Of Breath, Itching and Palpitations    "eyes itching,  Heart racing,  Effected breathing" 10-27-16 pt with 13 hr pre-meds  for CT without any reaction-kj   Metrizamide Shortness Of Breath, Itching and Palpitations     "eyes itching,  Heart racing,  Effected breathing" 10-27-16 pt with 13 hr pre-meds for CT without any reaction-kj   Shellfish Allergy Hives, Shortness Of Breath and Itching    Mostly crab   Gabapentin Other (See Comments)    dizziness and flushing   Niacin-Lovastatin Er Other (See Comments)    Did not feel good on medication   Gadolinium Hives   Adhesive [Tape] Itching and Rash    Use paper tape    Family History  Problem Relation Age of Onset   Stroke Mother        in her 31s   Breast cancer Sister    Heart  disease Maternal Grandmother 1       MI    Breast cancer Paternal Grandmother    Heart disease Paternal Grandfather 21       MI   Stroke Brother    Healthy Father    Diabetes Neg Hx    Social History:  reports that he has never smoked. He has never used smokeless tobacco. He reports that he does not drink alcohol and does not use drugs.  Review of Systems  Constitutional:  Positive for fever.  Genitourinary:  Positive for flank pain.  All other systems reviewed and are negative.   Physical Exam:  Vital signs in last 24 hours: Temp:  [98.3 F (36.8 C)-103 F (39.4 C)] 98.3 F (36.8 C) (02/18 1337) Pulse Rate:  [80-131] 83 (02/18 1337) Resp:  [12-33] 12 (02/18 1337) BP: (99-138)/(47-83) 109/55 (02/18 1337) SpO2:  [89 %-98 %] 98 % (02/18 1337) Weight:  [81.6 kg] 81.6 kg (02/18 1337) Physical Exam Vitals reviewed.  Constitutional:      Appearance: Normal appearance.  HENT:     Head: Normocephalic and atraumatic.     Nose: Nose normal. No congestion.     Mouth/Throat:     Mouth: Mucous membranes are dry.  Eyes:     Extraocular Movements: Extraocular movements intact.     Pupils: Pupils are equal, round, and reactive to light.  Cardiovascular:     Rate and Rhythm: Tachycardia present.  Pulmonary:     Effort: Pulmonary effort is normal. No respiratory distress.  Abdominal:     General: Abdomen is flat. There is no distension.  Musculoskeletal:     Cervical back: Normal range of motion and neck supple.  Skin:    General: Skin is warm and dry.  Neurological:     General: No focal deficit present.     Mental Status: He is alert and oriented to person, place, and time.  Psychiatric:        Mood and Affect: Mood normal.        Behavior: Behavior normal.        Thought Content: Thought content normal.        Judgment: Judgment normal.     Laboratory Data:  Results for orders placed or performed during the hospital encounter of 04/19/22 (from the past 72 hour(s))   Blood Culture (routine x 2)     Status: None (Preliminary result)   Collection Time: 04/19/22  1:00 AM   Specimen: Right Antecubital; Blood  Result Value Ref Range   Specimen Description      RIGHT ANTECUBITAL BLOOD Performed at [facility] [address] [place], [place] [address]    Special Requests      BOTTLES  DRAWN AEROBIC AND ANAEROBIC Blood Culture adequate volume Performed at [facility], [address] [place], [name] [address]    Culture PENDING    Report Status PENDING   Lactic acid, plasma     Status: None   Collection Time: 04/19/22  1:12 AM  Result Value Ref Range   Lactic Acid, Venous 1.8 0.5 - 1.9 mmol/L    Comment: Performed at [facility], [address] [place], [name] [address]  Comprehensive metabolic panel     Status: Abnormal   Collection Time: 04/19/22  1:12 AM  Result Value Ref Range   Sodium 135 135 - 145 mmol/L   Potassium 3.9 3.5 - 5.1 mmol/L   Chloride 110 98 - 111 mmol/L   CO2 20 (L) 22 - 32 mmol/L   Glucose, Bld 151 (H) 70 - 99 mg/dL    Comment: Glucose reference range applies only to samples taken after fasting for at least 8 hours.   BUN 23 8 - 23 mg/dL   Creatinine, Ser 1.88 (H) 0.61 - 1.24 mg/dL   Calcium 8.5 (L) 8.9 - 10.3 mg/dL   Total Protein 7.0 6.5 - 8.1 g/dL   Albumin 3.4 (L) 3.5 - 5.0 g/dL   AST 30 15 - 41 U/L   ALT 28 0 - 44 U/L   Alkaline Phosphatase 108 38 - 126 U/L   Total Bilirubin 0.5 0.3 - 1.2 mg/dL   GFR, Estimated 35 (L) >60 mL/min    Comment: (NOTE) Calculated using the CKD-EPI Creatinine Equation (2021)    Anion gap 5 5 - 15    Comment: Performed at [facility], [address] [place], [name] [address]  CBC with Differential     Status: Abnormal   Collection Time: 04/19/22  1:12 AM  Result Value Ref Range   WBC 11.2 (H) 4.0 - 10.5 K/uL   RBC 4.22 4.22 - 5.81 MIL/uL   Hemoglobin 13.0 13.0 - 17.0 g/dL   HCT 39.1 39.0 - 52.0 %   MCV 92.7 80.0 - 100.0 fL   MCH 30.8  26.0 - 34.0 pg   MCHC 33.2 30.0 - 36.0 g/dL   RDW 12.9 11.5 - 15.5 %   Platelets 280 150 - 400 K/uL   nRBC 0.0 0.0 - 0.2 %   Neutrophils Relative % 86 %   Neutro Abs 9.6 (H) 1.7 - 7.7 K/uL   Lymphocytes Relative 8 %   Lymphs Abs 0.9 0.7 - 4.0 K/uL   Monocytes Relative 6 %   Monocytes Absolute 0.7 0.1 - 1.0 K/uL   Eosinophils Relative 0 %   Eosinophils Absolute 0.1 0.0 - 0.5 K/uL   Basophils Relative 0 %   Basophils Absolute 0.0 0.0 - 0.1 K/uL   Immature Granulocytes 0 %   Abs Immature Granulocytes 0.04 0.00 - 0.07 K/uL    Comment: Performed at [facility], [address] [place], [name] [address]  Protime-INR     Status: None   Collection Time: 04/19/22  1:12 AM  Result Value Ref Range   Prothrombin Time 14.0 11.4 - 15.2 seconds   INR 1.1 0.8 - 1.2    Comment: (NOTE) INR goal varies based on device and disease states. Performed at [facility], [address] [place], [name] [address]   APTT     Status: None   Collection Time: 04/19/22  1:12 AM  Result Value Ref Range  aPTT 35 24 - 36 seconds    Comment: Performed at [facility], [address] [place], [name] [address]  Resp panel by RT-PCR (RSV, Flu A&B, Covid) Anterior Nasal Swab     Status: None   Collection Time: 04/19/22  1:13 AM   Specimen: Anterior Nasal Swab  Result Value Ref Range   SARS Coronavirus 2 by RT PCR NEGATIVE NEGATIVE    Comment: (NOTE) SARS-CoV-2 target nucleic acids are NOT DETECTED.  The SARS-CoV-2 RNA is generally detectable in upper respiratory specimens during the acute phase of infection. The lowest concentration of SARS-CoV-2 viral copies this assay can detect is 138 copies/mL. A negative result does not preclude SARS-Cov-2 infection and should not be used as the sole basis for treatment or other patient management decisions. A negative result may occur with  improper specimen collection/handling, submission of specimen other than nasopharyngeal  swab, presence of viral mutation(s) within the areas targeted by this assay, and inadequate number of viral copies(<138 copies/mL). A negative result must be combined with clinical observations, patient history, and epidemiological information. The expected result is Negative.  Fact Sheet for Patients:  EntrepreneurPulse.com.au  Fact Sheet for Healthcare Providers:  IncredibleEmployment.be  This test is no t yet approved or cleared by the [place] FDA and  has been authorized for detection and/or diagnosis of SARS-CoV-2 by FDA under an Emergency Use Authorization (EUA). This EUA will remain  in effect (meaning this test can be used) for the duration of the COVID-19 declaration under Section 564(b)(1) of the Act, 21 U.S.C.section 360bbb-3(b)(1), unless the authorization is terminated  or revoked sooner.       Influenza A by PCR NEGATIVE NEGATIVE   Influenza B by PCR NEGATIVE NEGATIVE    Comment: (NOTE) The Xpert Xpress SARS-CoV-2/FLU/RSV plus assay is intended as an aid in the diagnosis of influenza from Nasopharyngeal swab specimens and should not be used as a sole basis for treatment. Nasal washings and aspirates are unacceptable for Xpert Xpress SARS-CoV-2/FLU/RSV testing.  Fact Sheet for Patients: EntrepreneurPulse.com.au  Fact Sheet for Healthcare Providers: IncredibleEmployment.be  This test is not yet approved or cleared by the [place] FDA and has been authorized for detection and/or diagnosis of SARS-CoV-2 by FDA under an Emergency Use Authorization (EUA). This EUA will remain in effect (meaning this test can be used) for the duration of the COVID-19 declaration under Section 564(b)(1) of the Act, 21 U.S.C. section 360bbb-3(b)(1), unless the authorization is terminated or revoked.     Resp Syncytial Virus by PCR NEGATIVE NEGATIVE    Comment: (NOTE) Fact Sheet for  Patients: EntrepreneurPulse.com.au  Fact Sheet for Healthcare Providers: IncredibleEmployment.be  This test is not yet approved or cleared by the [place] FDA and has been authorized for detection and/or diagnosis of SARS-CoV-2 by FDA under an Emergency Use Authorization (EUA). This EUA will remain in effect (meaning this test can be used) for the duration of the COVID-19 declaration under Section 564(b)(1) of the Act, 21 U.S.C. section 360bbb-3(b)(1), unless the authorization is terminated or revoked.  Performed at [facility], [address] [place], [name] [address]   Urinalysis, w/ Reflex to Culture (Infection Suspected) -Urine, Unspecified Source     Status: Abnormal   Collection Time: 04/19/22  1:13 AM  Result Value Ref Range   Specimen Source URINE, UNSPE    Color, Urine YELLOW YELLOW   APPearance TURBID (A) CLEAR   Specific Gravity, Urine 1.010 1.005 - 1.030  pH 6.5 5.0 - 8.0   Glucose, UA NEGATIVE NEGATIVE mg/dL   Hgb urine dipstick SMALL (A) NEGATIVE   Bilirubin Urine NEGATIVE NEGATIVE   Ketones, ur NEGATIVE NEGATIVE mg/dL   Protein, ur NEGATIVE NEGATIVE mg/dL   Nitrite POSITIVE (A) NEGATIVE   Leukocytes,Ua MODERATE (A) NEGATIVE   Squamous Epithelial / HPF 6-10 0 - 5 /HPF   WBC, UA 11-20 0 - 5 WBC/hpf    Comment: Reflex urine culture not performed if WBC <=10, OR if Squamous epithelial cells >5. If Squamous epithelial cells >5, suggest recollection.   RBC / HPF 0-5 0 - 5 RBC/hpf   Bacteria, UA MANY (A) NONE SEEN    Comment: Performed at [facility], [address] [place], [name] [address]  CBC     Status: Abnormal   Collection Time: 04/19/22  5:35 AM  Result Value Ref Range   WBC 13.1 (H) 4.0 - 10.5 K/uL   RBC 2.87 (L) 4.22 - 5.81 MIL/uL   Hemoglobin 9.0 (L) 13.0 - 17.0 g/dL   HCT 28.4 (L) 39.0 - 52.0 %   MCV 99.0 80.0 - 100.0 fL   MCH 31.4 26.0 - 34.0 pg   MCHC 31.7 30.0 - 36.0 g/dL   RDW  13.0 11.5 - 15.5 %   Platelets 178 150 - 400 K/uL   nRBC 0.0 0.0 - 0.2 %    Comment: Performed at [facility], [place] [address], [place], [place] [address]  Creatinine, serum     Status: Abnormal   Collection Time: 04/19/22  5:35 AM  Result Value Ref Range   Creatinine, Ser 1.60 (H) 0.61 - 1.24 mg/dL   GFR, Estimated 43 (L) >60 mL/min    Comment: (NOTE) Calculated using the CKD-EPI Creatinine Equation (2021) Performed at [facility], [place] [address], [place], [place] [address]    Recent Results (from the past 240 hour(s))  Blood Culture (routine x 2)     Status: None (Preliminary result)   Collection Time: 04/19/22  1:00 AM   Specimen: Right Antecubital; Blood  Result Value Ref Range Status   Specimen Description   Final    RIGHT ANTECUBITAL BLOOD Performed at [facility], [place] [address] [place], [place] [address]    Special Requests   Final    BOTTLES DRAWN AEROBIC AND ANAEROBIC Blood Culture adequate volume Performed at [facility], [address] [place], [name] [address]    Culture PENDING  Incomplete   Report Status PENDING  Incomplete  Resp panel by RT-PCR (RSV, Flu A&B, Covid) Anterior Nasal Swab     Status: None   Collection Time: 04/19/22  1:13 AM   Specimen: Anterior Nasal Swab  Result Value Ref Range Status   SARS Coronavirus 2 by RT PCR NEGATIVE NEGATIVE Final    Comment: (NOTE) SARS-CoV-2 target nucleic acids are NOT DETECTED.  The SARS-CoV-2 RNA is generally detectable in upper respiratory specimens during the acute phase of infection. The lowest concentration of SARS-CoV-2 viral copies this assay can detect is 138 copies/mL. A negative result does not preclude SARS-Cov-2 infection and should not be used as the sole basis for treatment or other patient management decisions. A negative result may occur with  improper specimen collection/handling, submission of specimen other than nasopharyngeal swab,  presence of viral mutation(s) within the areas targeted by this assay, and inadequate number of viral copies(<138 copies/mL). A negative result must be combined with clinical observations,  patient history, and epidemiological information. The expected result is Negative.  Fact Sheet for Patients:  EntrepreneurPulse.com.au  Fact Sheet for Healthcare Providers:  IncredibleEmployment.be  This test is no t yet approved or cleared by the [place] FDA and  has been authorized for detection and/or diagnosis of SARS-CoV-2 by FDA under an Emergency Use Authorization (EUA). This EUA will remain  in effect (meaning this test can be used) for the duration of the COVID-19 declaration under Section 564(b)(1) of the Act, 21 U.S.C.section 360bbb-3(b)(1), unless the authorization is terminated  or revoked sooner.       Influenza A by PCR NEGATIVE NEGATIVE Final   Influenza B by PCR NEGATIVE NEGATIVE Final    Comment: (NOTE) The Xpert Xpress SARS-CoV-2/FLU/RSV plus assay is intended as an aid in the diagnosis of influenza from Nasopharyngeal swab specimens and should not be used as a sole basis for treatment. Nasal washings and aspirates are unacceptable for Xpert Xpress SARS-CoV-2/FLU/RSV testing.  Fact Sheet for Patients: EntrepreneurPulse.com.au  Fact Sheet for Healthcare Providers: IncredibleEmployment.be  This test is not yet approved or cleared by the [place] FDA and has been authorized for detection and/or diagnosis of SARS-CoV-2 by FDA under an Emergency Use Authorization (EUA). This EUA will remain in effect (meaning this test can be used) for the duration of the COVID-19 declaration under Section 564(b)(1) of the Act, 21 U.S.C. section 360bbb-3(b)(1), unless the authorization is terminated or revoked.     Resp Syncytial Virus by PCR NEGATIVE NEGATIVE Final    Comment: (NOTE) Fact Sheet for  Patients: EntrepreneurPulse.com.au  Fact Sheet for Healthcare Providers: IncredibleEmployment.be  This test is not yet approved or cleared by the [place] FDA and has been authorized for detection and/or diagnosis of SARS-CoV-2 by FDA under an Emergency Use Authorization (EUA). This EUA will remain in effect (meaning this test can be used) for the duration of the COVID-19 declaration under Section 564(b)(1) of the Act, 21 U.S.C. section 360bbb-3(b)(1), unless the authorization is terminated or revoked.  Performed at [facility], [address], [place], [name] [address]    Creatinine: Recent Labs    04/19/22 0112 04/19/22 0535  CREATININE 1.88* 1.60*   Baseline Creatinine: 1.2  Impression/Assessment:  81yo with right ureteral calculus, sepsis from a urinary source  Plan:  I discussed the management of ureteral calculi in patients with an ileal conduit. The patient is not a candidate for cystoscopy and ureteral stent placement due to the ileal conduit. The patient will require right nephrostomy tube placement. I have discussed the case with Interventional Radiology and he is scheduled for right nephrostomy tube placement this afternoon. He will require antegrade ureteroscopy after 2 weeks. The patient will contact [facility] to arrange treatment  Nicolette Bang 04/19/2022, 2:16 PM

## 2022-04-19 NOTE — ED Triage Notes (Signed)
Patient transferred from [place]. Patient was seen for urinary pain and fever.

## 2022-04-19 NOTE — ED Triage Notes (Signed)
Right flank pain that started yesterday and got serious this afternoon. Fever tonight. Hx of kidney stones.

## 2022-04-19 NOTE — ED Provider Notes (Addendum)
[place] DEPT [facility] Provider Note: Adrian Spurling, MD, FACEP  CSN: [id number] MRN: [id number] ARRIVAL: 04/18/22 at 2355 ROOM: WA23/WA23   CHIEF COMPLAINT  Flank Pain   HISTORY OF PRESENT ILLNESS  04/19/22 12:20 AM Adrian Rios is a 82 y.o. male with a history of kidney stones and a urostomy for bladder cancer.  He is here with right flank pain that started about 12 hours ago.  He rates the pain as a 9 out of 10, sharp in nature.  It does not worse with movement or palpation.  He has had an associated fever and was noted to have a temperature of 101.6 on arrival.  He denies respiratory symptoms.  He denies nausea, vomiting or diarrhea.   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    s/p  MOH's nasal --  pre-melanoma   History of squamous cell carcinoma excision    several times   Hyperlipidemia    Pre-diabetes    Premature ventricular contractions (PVCs) (VPCs)    RAD (reactive airway disease)    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  s/p  deep neck lymph node bx's and lung bx's   Sensorineural hearing loss (SNHL) of both ears    Sepsis ([place]) 09/2016   Ureterolithiasis    UTI (urinary tract infection) 08/2016   Wears glasses    Wears hearing aid    bilateral    Past Surgical History:  Procedure Laterality Date   APPENDECTOMY  2008   CARDIOVASCULAR STRESS TEST   05/27/2010   normal nuclear study w/ no ischemia/  normal LV function and wall motion , ef 60%   CYSTOSCOPY WITH INJECTION N/A 06/12/2016   Procedure: CYSTOSCOPY WITH INJECTION OF INDOCYANINE GREEN DYE;  Surgeon: Alexis Frock, MD;  Location: [facility];  Service: Urology;  Laterality: N/A;   DEEP NECK LYMPH NODE BIOPSY / EXCISION  1970's   and Bronchoscopy w/ bx's ( dx Sarcoidosis)   ILEOSTOMY     IR NEPHROSTOMY PLACEMENT LEFT  11/18/2016   MOHS SURGERY  2013 approx.    nasal; pre melanoma   PARS PLANA VITRECTOMY Right 2007   repair macular pucker   TONSILLECTOMY AND ADENOIDECTOMY  child   TRANSURETHRAL RESECTION OF BLADDER TUMOR N/A 04/06/2016   Procedure: CYSTOSCOPY TRANSURETHRAL RESECTION OF BLADDER TUMORS (TURBT);  Surgeon: Carolan Clines, MD;  Location: [facility];  Service: Urology;  Laterality: N/A;   TRANSURETHRAL RESECTION OF PROSTATE      Family History  Problem Relation Age of Onset   Stroke Mother        in her 15s   Breast cancer Sister    Heart disease Maternal Grandmother 84       MI  Breast cancer Paternal Grandmother    Heart disease Paternal Grandfather 81       MI   Stroke Brother    Healthy Father    Diabetes Neg Hx     Social History   Tobacco Use   Smoking status: Never   Smokeless tobacco: Never  Vaping Use   Vaping Use: Never used  Substance Use Topics   Alcohol use: No   Drug use: No    Prior to Admission medications   Medication Sig Start Date End Date Taking? Authorizing Provider  acetaminophen (TYLENOL) 500 MG tablet Take 1,000 mg by mouth as needed for headache (pain).    [provider]  ADVAIR DISKUS 250-50 MCG/ACT AEPB INHALE 1 PUFF INTO THE LUNGS EVERY 12 HOURS 12/11/21   Burns, Claudina Lick, MD  albuterol (VENTOLIN HFA) 108 (90 Base) MCG/ACT inhaler Inhale 2 puffs into the lungs every 6 (six) hours as needed for wheezing or shortness of breath. Patient taking differently: Inhale 1-2 puffs into the lungs as needed  for wheezing or shortness of breath. 02/01/20   [name], MD  Ascorbic Acid (VITAMIN C) 1000 MG tablet Take 1,000 mg by mouth daily.    [provider]  ibuprofen (ADVIL) 200 MG tablet Take 600 mg by mouth daily as needed for headache (pain). Patient not taking: Reported on 12/15/2021    [provider]  latanoprost (XALATAN) 0.005 % ophthalmic solution Place 1 drop into the right eye at bedtime. 05/06/20   [provider]  magnesium oxide (MAG-OX) 400 MG tablet Take 400 mg by mouth daily with supper.    [provider]  methenamine (HIPREX) 1 g tablet Take 1 tablet (1 g total) by mouth 2 (two) times daily with a meal. 06/21/21   Gonfa, Charlesetta Ivory, MD  Misc Natural Products (GLUCOSAMINE CHOND DOUBLE STR PO) Take 1 tablet by mouth 2 (two) times daily.    [provider]  montelukast (SINGULAIR) 10 MG tablet TAKE ONE TABLET BY MOUTH EVERY NIGHT AT BEDTIME 10/30/21   [name], MD  omeprazole (PRILOSEC) 40 MG capsule Take 1 capsule (40 mg total) by mouth daily before breakfast. 12/15/21   Levin Erp, PA  triamcinolone (NASACORT) 55 MCG/ACT AERO nasal inhaler Place 2 sprays into the nose at bedtime.    [provider]  venlafaxine (EFFEXOR) 75 MG tablet TAKE 1 AND 1/2 TABLET BY MOUTH DAILY 01/14/22   [name], MD    Allergies Ivp dye [iodinated contrast media], Metrizamide, Shellfish allergy, Gabapentin, Niacin-lovastatin er, Gadolinium, and Adhesive [tape]   REVIEW OF SYSTEMS  Negative except as noted here or in the History of Present Illness.   PHYSICAL EXAMINATION  Initial Vital Signs Blood pressure 138/83, pulse (!) 114, temperature (!) 101.6 F (38.7 C), temperature source Oral, resp. rate 20, height 5' 7"$  (1.702 m), weight 81.6 kg, SpO2 93 %.  Examination General: Well-developed, well-nourished male in no acute distress; appearance consistent with age of record HENT: normocephalic; atraumatic Eyes: Normal  appearance Neck: supple Heart: regular rate and rhythm; tachycardia Lungs: clear to auscultation bilaterally Abdomen: soft; nondistended; nontender; urostomy bag draining yellow urine; bowel sounds present Extremities: No deformity; full range of motion; pulses normal Neurologic: Awake, alert and oriented; motor function intact in all extremities and symmetric; no facial droop Skin: Warm and dry Psychiatric: Normal mood and affect   RESULTS  Summary of this visit's results, reviewed and interpreted by myself:   EKG Interpretation  Date/Time:  Sunday April 19 2022 01:16:41 EST Ventricular Rate:  110 PR Interval:  173 QRS Duration: 97 QT Interval:  340 QTC Calculation: 460 R Axis:   72 Text Interpretation: Sinus tachycardia Probable left atrial enlargement Rate is faster Confirmed by Raunak Antuna [id number]) on 04/19/2022 1:21:49 AM       Laboratory Studies: Results for orders placed or performed during the hospital encounter of 04/19/22 (from the past 24 hour(s))  Lactic acid, plasma     Status: None   Collection Time: 04/19/22  1:12 AM  Result Value Ref Range   Lactic Acid, Venous 1.8 0.5 - 1.9 mmol/L  Comprehensive metabolic panel     Status: Abnormal   Collection Time: 04/19/22  1:12 AM  Result Value Ref Range   Sodium 135 135 - 145 mmol/L   Potassium 3.9 3.5 - 5.1 mmol/L   Chloride 110 98 - 111 mmol/L   CO2 20 (L) 22 - 32 mmol/L   Glucose, Bld 151 (H) 70 - 99 mg/dL   BUN 23 8 - 23 mg/dL   Creatinine, Ser 1.88 (H) 0.61 - 1.24 mg/dL   Calcium 8.5 (L) 8.9 - 10.3 mg/dL   Total Protein 7.0 6.5 - 8.1 g/dL   Albumin 3.4 (L) 3.5 - 5.0 g/dL   AST 30 15 - 41 U/L   ALT 28 0 - 44 U/L   Alkaline Phosphatase 108 38 - 126 U/L   Total Bilirubin 0.5 0.3 - 1.2 mg/dL   GFR, Estimated 35 (L) >60 mL/min   Anion gap 5 5 - 15  CBC with Differential     Status: Abnormal   Collection Time: 04/19/22  1:12 AM  Result Value Ref Range   WBC 11.2 (H) 4.0 - 10.5 K/uL   RBC 4.22 4.22 - 5.81  MIL/uL   Hemoglobin 13.0 13.0 - 17.0 g/dL   HCT 39.1 39.0 - 52.0 %   MCV 92.7 80.0 - 100.0 fL   MCH 30.8 26.0 - 34.0 pg   MCHC 33.2 30.0 - 36.0 g/dL   RDW 12.9 11.5 - 15.5 %   Platelets 280 150 - 400 K/uL   nRBC 0.0 0.0 - 0.2 %   Neutrophils Relative % 86 %   Neutro Abs 9.6 (H) 1.7 - 7.7 K/uL   Lymphocytes Relative 8 %   Lymphs Abs 0.9 0.7 - 4.0 K/uL   Monocytes Relative 6 %   Monocytes Absolute 0.7 0.1 - 1.0 K/uL   Eosinophils Relative 0 %   Eosinophils Absolute 0.1 0.0 - 0.5 K/uL   Basophils Relative 0 %   Basophils Absolute 0.0 0.0 - 0.1 K/uL   Immature Granulocytes 0 %   Abs Immature Granulocytes 0.04 0.00 - 0.07 K/uL  Protime-INR     Status: None   Collection Time: 04/19/22  1:12 AM  Result Value Ref Range   Prothrombin Time 14.0 11.4 - 15.2 seconds   INR 1.1 0.8 - 1.2  APTT     Status: None   Collection Time: 04/19/22  1:12 AM  Result Value Ref Range   aPTT 35 24 - 36 seconds  Resp panel by RT-PCR (RSV, Flu A&B, Covid) Anterior Nasal Swab     Status: None   Collection Time: 04/19/22  1:13 AM   Specimen: Anterior Nasal Swab  Result Value Ref Range   SARS Coronavirus 2 by RT PCR NEGATIVE NEGATIVE   Influenza A by PCR NEGATIVE NEGATIVE   Influenza B by PCR NEGATIVE NEGATIVE   Resp Syncytial Virus  by PCR NEGATIVE NEGATIVE  Urinalysis, w/ Reflex to Culture (Infection Suspected) -Urine, Unspecified Source     Status: Abnormal   Collection Time: 04/19/22  1:13 AM  Result Value Ref Range   Specimen Source URINE, UNSPE    Color, Urine YELLOW YELLOW   APPearance TURBID (A) CLEAR   Specific Gravity, Urine 1.010 1.005 - 1.030   pH 6.5 5.0 - 8.0   Glucose, UA NEGATIVE NEGATIVE mg/dL   Hgb urine dipstick SMALL (A) NEGATIVE   Bilirubin Urine NEGATIVE NEGATIVE   Ketones, ur NEGATIVE NEGATIVE mg/dL   Protein, ur NEGATIVE NEGATIVE mg/dL   Nitrite POSITIVE (A) NEGATIVE   Leukocytes,Ua MODERATE (A) NEGATIVE   Squamous Epithelial / HPF 6-10 0 - 5 /HPF   WBC, UA 11-20 0 - 5  WBC/hpf   RBC / HPF 0-5 0 - 5 RBC/hpf   Bacteria, UA MANY (A) NONE SEEN   Imaging Studies: CT Renal Stone Study  Result Date: 04/19/2022 CLINICAL DATA:  Flank pain on the right for 2 days, initial encounter EXAM: CT ABDOMEN AND PELVIS WITHOUT CONTRAST TECHNIQUE: Multidetector CT imaging of the abdomen and pelvis was performed following the standard protocol without IV contrast. RADIATION DOSE REDUCTION: This exam was performed according to the departmental dose-optimization program which includes automated exposure control, adjustment of the mA and/or kV according to patient size and/or use of iterative reconstruction technique. COMPARISON:  10/09/2021 FINDINGS: Lower chest: No acute abnormality. Hepatobiliary: No focal liver abnormality is seen. No gallstones, gallbladder wall thickening, or biliary dilatation. Pancreas: Unremarkable. No pancreatic ductal dilatation or surrounding inflammatory changes. Spleen: Normal in size without focal abnormality. Adrenals/Urinary Tract: Adrenal glands are within normal limits. Kidneys are well visualized bilaterally. Stable left renal cyst is noted which is simple in nature. No follow-up is recommended. Bilateral hydronephrosis is noted right greater than left. The left hydronephrosis is stable from the prior study. Previously seen nonobstructing right renal stone has now migrated into the more distal ureter. This is best seen on image number 55 of series 2 and 49 of series 5. This accounts for the increased hydronephrosis and right-sided flank pain. No obstructive changes are noted on the left. Ileal conduit is noted in the right lower quadrant. The bladder has been surgically removed. Stomach/Bowel: Scattered diverticular changes noted in the colon without evidence of diverticulitis. The appendix has been surgically removed. No obstructive or inflammatory changes of the small bowel are noted. Stomach is within normal limits. Vascular/Lymphatic: Aortic atherosclerosis.  No enlarged abdominal or pelvic lymph nodes. Reproductive: Prostate has been surgically removed as well. Other: No abdominal wall hernia or abnormality. No abdominopelvic ascites. Musculoskeletal: Degenerative changes of lumbar spine are noted. IMPRESSION: Interval migration of a 5 mm stone from the right kidney into the distal right ureter with increased hydronephrosis. Diverticulosis without diverticulitis. Changes of prior cystectomy with ileal conduit placement Electronically Signed   By: Inez Catalina M.D.   On: 04/19/2022 00:55    ED COURSE and MDM  Nursing notes, initial and subsequent vitals signs, including pulse oximetry, reviewed and interpreted by myself.  Vitals:   04/19/22 0330 04/19/22 0400 04/19/22 0449 04/19/22 0451  BP: (!) 108/50 (!) 99/47    Pulse: (!) 131 (!) 126    Resp: (!) 32 (!) 29    Temp:      TempSrc:      SpO2: 94% 95% 96% 96%  Weight:      Height:       Medications  lactated ringers infusion (  Intravenous New Bag/Given 04/19/22 0056)  lactated ringers bolus 1,000 mL (0 mLs Intravenous Stopped 04/19/22 0212)    And  lactated ringers bolus 1,000 mL (0 mLs Intravenous Stopped 04/19/22 0212)    And  lactated ringers bolus 500 mL (0 mLs Intravenous Stopped 04/19/22 0259)  ceFEPIme (MAXIPIME) 2 g in sodium chloride 0.9 % 100 mL IVPB (0 g Intravenous Stopped 04/19/22 0157)  acetaminophen (TYLENOL) tablet 1,000 mg (1,000 mg Oral Given 04/19/22 0047)  HYDROmorphone (DILAUDID) injection 1 mg (1 mg Intravenous Given 04/19/22 0228)  ibuprofen (ADVIL) tablet 400 mg (400 mg Oral Given 04/19/22 0301)   Sepsis protocol initiated.  Given the high suspicion for urinary source 2 g of Maxipime were ordered.  Urine specimen and culture obtained from urostomy bag.  1:59 AM Discussed with Dr. Noah Delaine of urology.  He would like the [facility] ED.  He would like the patient admitted to the hospitalist service and they will consult.  He anticipates a right  nephrostomy tube placement by IR later today.  Dr. Wyvonnia Dusky is accepting EDP.   PROCEDURES  Procedures CRITICAL CARE Performed by: Karen Chafe Vidyuth Belsito Total critical care time: 30 minutes Critical care time was exclusive of separately billable procedures and treating other patients. Critical care was necessary to treat or prevent imminent or life-threatening deterioration. Critical care was time spent personally by me on the following activities: development of treatment plan with patient and/or surrogate as well as nursing, discussions with consultants, evaluation of patient's response to treatment, examination of patient, obtaining history from patient or surrogate, ordering and performing treatments and interventions, ordering and review of laboratory studies, ordering and review of radiographic studies, pulse oximetry and re-evaluation of patient's condition.   ED DIAGNOSES     ICD-10-CM   1. Sepsis due to urinary tract infection (HCC)  A41.9    N39.0     2. Ureterolithiasis  N20.1          Cleburn Maiolo, MD 04/19/22 0220    Shanon Rosser, MD 04/19/22 [phone]

## 2022-04-19 NOTE — ED Notes (Signed)
Respiratory paged for [place] per MD

## 2022-04-20 DIAGNOSIS — A419 Sepsis, unspecified organism: Secondary | ICD-10-CM

## 2022-04-20 DIAGNOSIS — D649 Anemia, unspecified: Secondary | ICD-10-CM | POA: Diagnosis not present

## 2022-04-20 DIAGNOSIS — Z936 Other artificial openings of urinary tract status: Secondary | ICD-10-CM

## 2022-04-20 DIAGNOSIS — B961 Klebsiella pneumoniae [K. pneumoniae] as the cause of diseases classified elsewhere: Secondary | ICD-10-CM

## 2022-04-20 DIAGNOSIS — N10 Acute tubulo-interstitial nephritis: Secondary | ICD-10-CM | POA: Diagnosis not present

## 2022-04-20 DIAGNOSIS — I959 Hypotension, unspecified: Secondary | ICD-10-CM | POA: Diagnosis not present

## 2022-04-20 DIAGNOSIS — R7881 Bacteremia: Secondary | ICD-10-CM | POA: Diagnosis present

## 2022-04-20 DIAGNOSIS — N132 Hydronephrosis with renal and ureteral calculous obstruction: Secondary | ICD-10-CM

## 2022-04-20 DIAGNOSIS — R519 Headache, unspecified: Secondary | ICD-10-CM

## 2022-04-20 DIAGNOSIS — J9601 Acute respiratory failure with hypoxia: Secondary | ICD-10-CM | POA: Diagnosis not present

## 2022-04-20 LAB — BLOOD CULTURE ID PANEL (REFLEXED) - BCID2

## 2022-04-20 LAB — CBC WITH DIFFERENTIAL/PLATELET
Abs Immature Granulocytes: 0.06 10*3/uL (ref 0.00–0.07)
Basophils Absolute: 0 10*3/uL (ref 0.0–0.1)
Basophils Relative: 0 %
Eosinophils Absolute: 0 10*3/uL (ref 0.0–0.5)
Eosinophils Relative: 0 %
HCT: 34 % — ABNORMAL LOW (ref 39.0–52.0)
Hemoglobin: 11 g/dL — ABNORMAL LOW (ref 13.0–17.0)
Immature Granulocytes: 0 %
Lymphocytes Relative: 8 %
Lymphs Abs: 1.1 10*3/uL (ref 0.7–4.0)
MCH: 31.5 pg (ref 26.0–34.0)
MCHC: 32.4 g/dL (ref 30.0–36.0)
MCV: 97.4 fL (ref 80.0–100.0)
Monocytes Absolute: 1.1 10*3/uL — ABNORMAL HIGH (ref 0.1–1.0)
Monocytes Relative: 8 %
Neutro Abs: 11.2 10*3/uL — ABNORMAL HIGH (ref 1.7–7.7)
Neutrophils Relative %: 84 %
Platelets: 225 10*3/uL (ref 150–400)
RBC: 3.49 MIL/uL — ABNORMAL LOW (ref 4.22–5.81)
RDW: 13.4 % (ref 11.5–15.5)
WBC: 13.5 10*3/uL — ABNORMAL HIGH (ref 4.0–10.5)
nRBC: 0 % (ref 0.0–0.2)

## 2022-04-20 LAB — VITAMIN B12: Vitamin B-12: 415 pg/mL (ref 180–914)

## 2022-04-20 LAB — COMPREHENSIVE METABOLIC PANEL
ALT: 27 U/L (ref 0–44)
AST: 26 U/L (ref 15–41)
Albumin: 3.3 g/dL — ABNORMAL LOW (ref 3.5–5.0)
Alkaline Phosphatase: 72 U/L (ref 38–126)
Anion gap: 9 (ref 5–15)
BUN: 24 mg/dL — ABNORMAL HIGH (ref 8–23)
CO2: 19 mmol/L — ABNORMAL LOW (ref 22–32)
Calcium: 8.5 mg/dL — ABNORMAL LOW (ref 8.9–10.3)
Chloride: 109 mmol/L (ref 98–111)
Creatinine, Ser: 1.83 mg/dL — ABNORMAL HIGH (ref 0.61–1.24)
GFR, Estimated: 37 mL/min — ABNORMAL LOW (ref >60)
Glucose, Bld: 135 mg/dL — ABNORMAL HIGH (ref 70–99)
Potassium: 3.9 mmol/L (ref 3.5–5.1)
Sodium: 137 mmol/L (ref 135–145)
Total Bilirubin: 0.7 mg/dL (ref 0.3–1.2)
Total Protein: 6.2 g/dL — ABNORMAL LOW (ref 6.5–8.1)

## 2022-04-20 LAB — FOLATE: Folate: 15.3 ng/mL (ref >5.9)

## 2022-04-20 LAB — IRON AND TIBC
Iron: 19 ug/dL — ABNORMAL LOW (ref 45–182)
Saturation Ratios: 11 % — ABNORMAL LOW (ref 17.9–39.5)
TIBC: 174 ug/dL — ABNORMAL LOW (ref 250–450)
UIBC: 155 ug/dL

## 2022-04-20 LAB — MAGNESIUM: Magnesium: 2 mg/dL (ref 1.7–2.4)

## 2022-04-20 LAB — FERRITIN: Ferritin: 156 ng/mL (ref 24–336)

## 2022-04-20 MED ORDER — HYDROMORPHONE HCL 1 MG/ML IJ SOLN
0.5000 mg | Freq: Once | INTRAMUSCULAR | Status: AC
  Administered 2022-04-20: 0.5 mg via INTRAVENOUS
  Filled 2022-04-20: qty 1

## 2022-04-20 MED ORDER — POLYETHYLENE GLYCOL 3350 17 G PO PACK: 17.0000 g | PACK | Freq: Every day | ORAL | Status: DC

## 2022-04-20 MED ORDER — SODIUM CHLORIDE 0.9 % IV SOLN
2.0000 g | INTRAVENOUS | Status: AC
  Administered 2022-04-21 – 2022-04-22 (×2): 2 g via INTRAVENOUS
  Filled 2022-04-20 (×2): qty 20

## 2022-04-20 MED ORDER — METOCLOPRAMIDE HCL 5 MG/ML IJ SOLN
5.0000 mg | Freq: Once | INTRAMUSCULAR | Status: AC
  Administered 2022-04-20: 5 mg via INTRAVENOUS
  Filled 2022-04-20: qty 2

## 2022-04-20 MED ORDER — SODIUM CHLORIDE 0.9 % IV SOLN: INTRAVENOUS | Status: DC

## 2022-04-20 MED ORDER — HYDROMORPHONE HCL 1 MG/ML IJ SOLN
0.5000 mg | INTRAMUSCULAR | Status: DC | PRN
  Administered 2022-04-20 (×2): 0.5 mg via INTRAVENOUS
  Filled 2022-04-20 (×2): qty 1

## 2022-04-20 MED ORDER — SENNOSIDES-DOCUSATE SODIUM 8.6-50 MG PO TABS
1.0000 | ORAL_TABLET | Freq: Two times a day (BID) | ORAL | Status: DC
  Administered 2022-04-20 – 2022-04-22 (×3): 1 via ORAL
  Filled 2022-04-20 (×4): qty 1

## 2022-04-20 NOTE — TOC Initial Note (Addendum)
Transition of Care [facility]) - Initial/Assessment Note    Patient Details  Name: Adrian Rios MRN: [id number] Date of Birth: 13-Dec-1940  Transition of [facility]) CM/SW Contact:    Roseanne Kaufman, RN Phone Number: 04/20/2022, 1:53 PM  Clinical Narrative:     Per chart review patient has history of bladder cancer, percutaneous nephrostomy tube placed by IR on 04/19/22 and ID is following the patient. This RNCM spoke with patient's wife Dianah Field who advised prior to admission patient did not have any HH or DME services. No current TOC needs.  Transportation at discharge: Nickolos Loughran (wife)                TOC will continue to follow for needs.  Expected Discharge Plan: Home/Self Care Barriers to Discharge: Continued Medical Work up   Patient Goals and CMS Choice Patient states their goals for this hospitalization and ongoing recovery are:: return home CMS Medicare.gov Compare Post Acute Care list provided to:: Patient Represenative (must comment) (Wife: [name]) Choice offered to / list presented to : [place] ownership interest in [facility].provided to:: Spouse    Expected Discharge Plan and Services In-house Referral: NA Discharge Planning Services: CM Consult   Living arrangements for the past 2 months: Single Family Home                 DME Arranged: N/A DME Agency: NA       HH Arranged: NA HH Agency: NA        Prior Living Arrangements/Services Living arrangements for the past 2 months: Single Family Home Lives with:: Spouse Patient language and need for interpreter reviewed:: Yes Do you feel safe going back to the place where you live?: Yes      Need for Family Participation in Patient Care: No (Comment) Care giver support system in place?: Yes (comment) Current home services:  (None) Criminal Activity/Legal Involvement Pertinent to Current Situation/Hospitalization: No - Comment as needed  Activities of Daily Living Home Assistive  Devices/Equipment: None ADL Screening (condition at time of admission) Patient's cognitive ability adequate to safely complete daily activities?: Yes Is the patient deaf or have difficulty hearing?: Yes Does the patient have difficulty seeing, even when wearing glasses/contacts?: No Does the patient have difficulty concentrating, remembering, or making decisions?: No Patient able to express need for assistance with ADLs?: Yes Does the patient have difficulty dressing or bathing?: No Independently performs ADLs?: Yes (appropriate for developmental age) Does the patient have difficulty walking or climbing stairs?: No Weakness of Legs: None Weakness of Arms/Hands: None  Permission Sought/Granted Permission sought to share information with : Case Manager Permission granted to share information with : Yes, Verbal Permission Granted  Share Information with NAME: Case Manager           Emotional Assessment Appearance:: Appears stated age Attitude/Demeanor/Rapport: Gracious Affect (typically observed): Accepting Orientation: : Oriented to Self, Oriented to Place, Oriented to  Time Alcohol / Substance Use: Not Applicable Psych Involvement: No (comment)  Admission diagnosis:  Ureterolithiasis [N20.1] Sepsis associated hypotension (HCC) [A41.9, I95.9] Sepsis due to urinary tract infection ([place]) [A41.9, N39.0] Severe sepsis with acute organ dysfunction ([place]) [A41.9, R65.20] Patient Active Problem List   Diagnosis Date Noted   Bacteremia due to Klebsiella pneumoniae 04/20/2022   Acute nonintractable headache 04/20/2022   Sepsis associated hypotension ([place]) 04/19/2022   Severe sepsis with acute organ dysfunction ([place]) 04/19/2022   Acute pyelonephritis 04/19/2022   Acute respiratory failure with hypoxia ([place]) 04/19/2022  Ureteral stone with hydronephrosis 04/19/2022   ARF (acute renal failure) ([place]) 04/19/2022   Anemia 04/19/2022   Sepsis due to urinary tract infection ([place])     Recurrent UTI 11/11/2021   Hypomagnesemia 06/25/2021   Left shoulder pain 05/01/2021   Right flank pain 03/17/2021   CKD (chronic kidney disease) stage 3, GFR 30-59 ml/min (HCC) 03/17/2021   Pyelonephritis [id number]   Complicated UTI (urinary tract infection) 11/24/2020   Sepsis due to complicated UTI and patient with history of ESBL UTI 11/24/2020   History of ESBL E. 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[place]) 06/12/2016   Carotid bruit 02/17/2016   Prediabetes 08/20/2015   Glaucoma 02/14/2015   Varicose veins of lower extremities with other complications 123456   Asthma 10/27/2011   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[place]), 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   PREMATURE VENTRICULAR CONTRACTIONS 04/29/2010   ERECTILE DYSFUNCTION 04/23/2010   [place] DISEASE, LUMBOSACRAL SPINE 02/06/2010   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   Depression 04/12/2008   Allergic rhinitis 04/12/2008   Hyperlipidemia 10/05/2006   Sarcoidosis ([place]) 07/05/2006   PCP:  [name], MD Pharmacy:   [place] [id number] - [place], [place] [address] [name] [address] Phone: [phone] Fax: [place] [phone] - [place], [address] AT [facility] [address] [name] [address] Phone: [phone] Fax: [phone]     Social Determinants of Health (SDOH) Social History: SDOH Screenings   Food Insecurity: No Food Insecurity (04/19/2022)   Housing: Low Risk  (04/19/2022)  Transportation Needs: No Transportation Needs (04/19/2022)  Utilities: Not At Risk (04/19/2022)  Alcohol Screen: Low Risk  (11/01/2020)  Depression (PHQ2-9): Low Risk  (10/22/2021)  Financial Resource Strain: Low Risk  (11/01/2020)  Physical Activity: Sufficiently Active (11/01/2020)  Social Connections: Socially Integrated (11/01/2020)  Stress: No Stress Concern Present (11/01/2020)  Tobacco Use: Low Risk  (04/19/2022)   SDOH Interventions:     Readmission Risk Interventions     No data to display

## 2022-04-20 NOTE — Consult Note (Signed)
Date of Admission:  04/19/2022          Reason for Consult: Klebsiella pneumonia bacteremia with sepsis them urinary source    Referring Provider: Irine Seal, MD   Assessment:  Klebsiella pneumonia bacteremia with Sepsis from  Urinary source with  History of bladder cancer with radical cystectomy and ileal conduit problems with chronic nephrolithiasis now on this admission with Migration of 5 mm stone from right kidney into the distal ureter with increased hydronephrosis now status post right IR guided nephrostomy tube placement with plans for Anterograde ureteroscopy at [facility] urology after 2 weeks of treatment  Plan:  Will continue Merrem for today but switch to Ceftriaxone tomorrow--organism is NOT showing CTX ESBL on BCID Repeat blood cultures If organism is S to oral antibiotics will step  down to them and use to bridge him to his procedure at [facility] [place] Problem:   Sepsis associated hypotension ([place]) Active Problems:   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HCC)   History of bladder cancer   Chronic pain of both shoulders   Complicated UTI (urinary tract infection)   Severe sepsis with acute organ dysfunction (HCC)   Acute pyelonephritis   Acute respiratory failure with hypoxia (HCC)   Ureteral stone with hydronephrosis   ARF (acute renal failure) (HCC)   Anemia   Bacteremia due to Klebsiella pneumoniae   Scheduled Meds:  arformoterol  15 mcg Nebulization BID   budesonide (PULMICORT) nebulizer solution  0.25 mg Nebulization BID   Chlorhexidine Gluconate Cloth  6 each Topical Daily   enoxaparin (LOVENOX) injection  40 mg Subcutaneous Q24H    HYDROmorphone (DILAUDID) injection  0.5 mg Intravenous Once   latanoprost  1 drop Right Eye QHS   magnesium oxide  400 mg Oral Q supper   metoCLOPramide (REGLAN) injection  5 mg Intravenous Once   montelukast  10 mg Oral QHS   pantoprazole  40 mg Oral Daily   venlafaxine  112.5 mg Oral Daily   Continuous Infusions:   sodium chloride 10 mL/hr (04/19/22 1758)   sodium chloride     [START ON 04/21/2022] cefTRIAXone (ROCEPHIN)  IV     meropenem (MERREM) IV Stopped (04/20/22 0648)   PRN Meds:.sodium chloride, acetaminophen **OR** acetaminophen, HYDROmorphone (DILAUDID) injection, ipratropium-albuterol, ondansetron **OR** ondansetron (ZOFRAN) IV, mouth rinse, oxyCODONE  HPI: Adrian Rios is a 82 y.o. male past medical history significant for bladder cancer status post ileal conduit after radical cystectomy with history of recurrent kidney stones and problems with recurrent infections including with ESBL E. coli.  The patient has been doing well and and Saturday afternoon even went out to lunch and felt fine.  Unfortunately around 8:00 he began to develop right flank pain with fevers to 103 degrees.  He had rapid progression and came to the ER was found to be tachycardic and hypotensive.  Blood cultures were taken and he was given broad-spectrum antibiotics CT of the abdomen pelvis was done which showed a 5 mm stone in the distal right ureter that had migrated and was causing worsening hydronephrosis.  Blood cultures of subsequently yielded Klebsiella pneumonia from 1 of 2 sites sampled with [facility] ID not showing ESBL.  Urine culture is still pending  Seen by urology and the recommendation IR is placed a right nephrostomy tube.  He will be reviewed evaluated at [facility] for possible antegrade cystoscopy.  Continuing meropenem today but will switch to dapsone tomorrow.  Hopefully there is an oral antibiotic we  could use to stepdown to and to bridge him to his procedure at [facility]  I spent 82 minutes with the patient including than 50% of the time in face to face counseling of the patient regarding his problems with recurrent infections related to stones in the context of his ileal conduit personally reviewing the abdomen pelvis along with review of medical records in preparation for the visit and during the visit and in  coordination of his care.    Review of Systems: Review of Systems  Constitutional:  Positive for fever and malaise/fatigue. Negative for chills and weight loss.  HENT:  Negative for congestion and sore throat.   Eyes:  Negative for blurred vision and photophobia.  Respiratory:  Negative for cough, shortness of breath and wheezing.   Cardiovascular:  Negative for chest pain, palpitations and leg swelling.  Gastrointestinal:  Negative for abdominal pain, blood in stool, constipation, diarrhea, heartburn, melena, nausea and vomiting.  Genitourinary:  Negative for dysuria, flank pain and hematuria.  Musculoskeletal:  Negative for back pain, falls, joint pain and myalgias.  Skin:  Negative for itching and rash.  Neurological:  Positive for weakness. Negative for dizziness, focal weakness, loss of consciousness and headaches.  Endo/Heme/Allergies:  Does not bruise/bleed easily.  Psychiatric/Behavioral:  Negative for depression and suicidal ideas. The patient does not have insomnia.     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    s/p  MOH's nasal --  pre-melanoma   History of squamous cell carcinoma excision    several times   Hyperlipidemia    Pre-diabetes    Premature ventricular contractions (PVCs) (VPCs)    RAD (reactive airway disease)    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   s/p  deep neck lymph node bx's and lung bx's   Sensorineural hearing loss (SNHL) of both ears    Sepsis ([place]) 09/2016   Ureterolithiasis    UTI (urinary tract infection) 08/2016   Wears glasses    Wears hearing aid    bilateral    Social History   Tobacco Use   Smoking status: Never   Smokeless tobacco: Never  Vaping Use   Vaping Use: Never used  Substance Use Topics   Alcohol use: No   Drug use: No    Family History  Problem Relation Age of Onset   Stroke Mother        in her 15s   Breast cancer Sister    Heart disease Maternal Grandmother 42       MI    Breast cancer Paternal Grandmother    Heart disease Paternal Grandfather 31       MI   Stroke Brother    Healthy Father    Diabetes Neg  Hx    Allergies  Allergen Reactions   Ivp Dye [Iodinated Contrast Media] Hives, Shortness Of Breath, Itching and Palpitations    "eyes itching,  Heart racing,  Effected breathing" 10-27-16 pt with 13 hr pre-meds for CT without any reaction-kj   Metrizamide Shortness Of Breath, Itching and Palpitations     "eyes itching,  Heart racing,  Effected breathing" 10-27-16 pt with 13 hr pre-meds for CT without any reaction-kj   Shellfish Allergy Hives, Shortness Of Breath and Itching    Mostly crab   Gabapentin Other (See Comments)    dizziness and flushing   Niacin-Lovastatin Er Other (See Comments)    Did not feel good on medication   Gadolinium Hives   Adhesive [Tape] Itching and Rash    Use paper tape    OBJECTIVE: Blood pressure (!) 102/54, pulse 74, temperature 99 F (37.2 C), temperature source Oral, resp. rate 18, height 5' 7"$  (1.702 m), weight 81.6 kg, SpO2 96 %.  Physical Exam Constitutional:      Appearance: He is well-developed.  HENT:     Head: Normocephalic and atraumatic.  Eyes:     Conjunctiva/sclera: Conjunctivae normal.  Cardiovascular:     Rate and Rhythm: Normal rate and regular rhythm.  Pulmonary:     Effort: Pulmonary effort is normal. No respiratory  distress.     Breath sounds: No wheezing.  Abdominal:     Palpations: Abdomen is soft.     Comments: Ileal conduit is clean he has right-sided nephrostomy tube in place  Musculoskeletal:        General: No tenderness. Normal range of motion.     Cervical back: Normal range of motion and neck supple.  Skin:    General: Skin is warm and dry.     Coloration: Skin is not pale.     Findings: No erythema or rash.  Neurological:     General: No focal deficit present.     Mental Status: He is alert and oriented to person, place, and time.  Psychiatric:        Mood and Affect: Mood normal.        Behavior: Behavior normal.        Thought Content: Thought content normal.        Judgment: Judgment normal.     Lab Results Lab Results  Component Value Date   WBC 13.5 (H) 04/20/2022   HGB 11.0 (L) 04/20/2022   HCT 34.0 (L) 04/20/2022   MCV 97.4 04/20/2022   PLT 225 04/20/2022    Lab Results  Component Value Date   CREATININE 1.83 (H) 04/20/2022   BUN 24 (H) 04/20/2022   NA 137 04/20/2022   K 3.9 04/20/2022   CL 109 04/20/2022   CO2 19 (L) 04/20/2022    Lab Results  Component Value Date   ALT 27 04/20/2022   AST 26 04/20/2022   ALKPHOS 72 04/20/2022   BILITOT 0.7 04/20/2022     Microbiology: Recent Results (from the past 240 hour(s))  Blood Culture (routine x 2)     Status: None (Preliminary result)   Collection Time: 04/19/22 12:55 AM   Specimen: BLOOD LEFT FOREARM  Result Value Ref Range Status   Specimen Description   Final    BLOOD LEFT FOREARM Performed at [facility], [address] [place], [name] [address]    Special Requests   Final    BOTTLES DRAWN AEROBIC AND ANAEROBIC Blood Culture adequate volume Performed at [facility], [address] [place], [name] [address]    Culture   Final    NO GROWTH < 24 HOURS Performed at [facility], [place] [address], [place], [place] [address]    Report Status PENDING  Incomplete  Blood  Culture (routine x 2)     Status: Abnormal (Preliminary result)   Collection Time: 04/19/22  1:00 AM   Specimen: Right Antecubital; Blood  Result Value Ref Range Status   Specimen Description   Final    RIGHT ANTECUBITAL BLOOD Performed at [facility], [place] [address], [place], [place] [address]    Special Requests   Final    BOTTLES DRAWN AEROBIC AND ANAEROBIC Blood Culture adequate volume Performed at [facility], [address] [place], [name] [address]    Culture  Setup Time   Final    GRAM NEGATIVE RODS IN BOTH AEROBIC AND ANAEROBIC BOTTLES CRITICAL RESULT CALLED TO, READ BACK BY AND VERIFIED WITH: L POINDEXTER,PHARMD@0243$  04/20/22 [place]    Culture (A)  Final    KLEBSIELLA PNEUMONIAE SUSCEPTIBILITIES TO FOLLOW Performed at [facility], [place] [address] [place], [place] [address]    Report Status PENDING  Incomplete  Blood Culture ID Panel (Reflexed)     Status: Abnormal   Collection Time: 04/19/22  1:00 AM  Result Value Ref Range Status   Enterococcus faecalis NOT DETECTED NOT DETECTED Final   Enterococcus Faecium NOT DETECTED NOT DETECTED Final   Listeria monocytogenes NOT DETECTED NOT DETECTED Final   Staphylococcus species NOT DETECTED NOT DETECTED Final   Staphylococcus aureus (BCID) NOT DETECTED NOT DETECTED Final   Staphylococcus epidermidis NOT DETECTED NOT DETECTED Final   Staphylococcus lugdunensis NOT DETECTED NOT DETECTED Final   Streptococcus species NOT DETECTED NOT DETECTED Final   Streptococcus agalactiae NOT DETECTED NOT DETECTED Final   Streptococcus pneumoniae NOT DETECTED NOT DETECTED Final   Streptococcus pyogenes NOT DETECTED NOT DETECTED Final   A.calcoaceticus-baumannii NOT DETECTED NOT DETECTED Final   Bacteroides fragilis NOT DETECTED NOT DETECTED Final   Enterobacterales DETECTED (A) NOT DETECTED Final    Comment: Enterobacterales represent a large order of gram negative bacteria, not a single organism. CRITICAL RESULT  CALLED TO, READ BACK BY AND VERIFIED WITH: L POINDEXTER,PHARMD@0245$  04/20/22 [place]    Enterobacter cloacae complex NOT DETECTED NOT DETECTED Final   Escherichia coli NOT DETECTED NOT DETECTED Final   Klebsiella aerogenes NOT DETECTED NOT DETECTED Final   Klebsiella oxytoca NOT DETECTED NOT DETECTED Final   Klebsiella pneumoniae DETECTED (A) NOT DETECTED Final    Comment: CRITICAL RESULT CALLED TO, READ BACK BY AND VERIFIED WITH: L POINDEXTER,PHARMD@0244$  04/20/22 [place]    Proteus species NOT DETECTED NOT DETECTED Final   Salmonella species NOT DETECTED NOT DETECTED Final   Serratia marcescens NOT DETECTED NOT DETECTED Final   Haemophilus influenzae NOT DETECTED NOT DETECTED Final   Neisseria meningitidis NOT DETECTED NOT DETECTED Final   Pseudomonas aeruginosa NOT DETECTED NOT DETECTED Final   Stenotrophomonas maltophilia NOT DETECTED NOT DETECTED Final   Candida albicans NOT DETECTED NOT DETECTED Final   Candida auris NOT DETECTED NOT DETECTED Final   Candida glabrata NOT DETECTED NOT DETECTED Final   Candida krusei NOT DETECTED NOT DETECTED Final   Candida parapsilosis NOT DETECTED NOT DETECTED Final   Candida tropicalis NOT DETECTED NOT DETECTED Final   Cryptococcus neoformans/gattii NOT DETECTED NOT DETECTED Final   CTX-M ESBL NOT DETECTED NOT DETECTED Final  Carbapenem resistance IMP NOT DETECTED NOT DETECTED Final   Carbapenem resistance KPC NOT DETECTED NOT DETECTED Final   Carbapenem resistance NDM NOT DETECTED NOT DETECTED Final   Carbapenem resist OXA 48 LIKE NOT DETECTED NOT DETECTED Final   Carbapenem resistance VIM NOT DETECTED NOT DETECTED Final    Comment: Performed at [facility], [place] [address] [place], [place] [address]  Resp panel by RT-PCR (RSV, Flu A&B, Covid) Anterior Nasal Swab     Status: None   Collection Time: 04/19/22  1:13 AM   Specimen: Anterior Nasal Swab  Result Value Ref Range Status   SARS Coronavirus 2 by RT PCR NEGATIVE NEGATIVE Final     Comment: (NOTE) SARS-CoV-2 target nucleic acids are NOT DETECTED.  The SARS-CoV-2 RNA is generally detectable in upper respiratory specimens during the acute phase of infection. The lowest concentration of SARS-CoV-2 viral copies this assay can detect is 138 copies/mL. A negative result does not preclude SARS-Cov-2 infection and should not be used as the sole basis for treatment or other patient management decisions. A negative result may occur with  improper specimen collection/handling, submission of specimen other than nasopharyngeal swab, presence of viral mutation(s) within the areas targeted by this assay, and inadequate number of viral copies(<138 copies/mL). A negative result must be combined with clinical observations, patient history, and epidemiological information. The expected result is Negative.  Fact Sheet for Patients:  EntrepreneurPulse.com.au  Fact Sheet for Healthcare Providers:  IncredibleEmployment.be  This test is no t yet approved or cleared by the [place] FDA and  has been authorized for detection and/or diagnosis of SARS-CoV-2 by FDA under an Emergency Use Authorization (EUA). This EUA will remain  in effect (meaning this test can be used) for the duration of the COVID-19 declaration under Section 564(b)(1) of the Act, 21 U.S.C.section 360bbb-3(b)(1), unless the authorization is terminated  or revoked sooner.       Influenza A by PCR NEGATIVE NEGATIVE Final   Influenza B by PCR NEGATIVE NEGATIVE Final    Comment: (NOTE) The Xpert Xpress SARS-CoV-2/FLU/RSV plus assay is intended as an aid in the diagnosis of influenza from Nasopharyngeal swab specimens and should not be used as a sole basis for treatment. Nasal washings and aspirates are unacceptable for Xpert Xpress SARS-CoV-2/FLU/RSV testing.  Fact Sheet for Patients: EntrepreneurPulse.com.au  Fact Sheet for Healthcare  Providers: IncredibleEmployment.be  This test is not yet approved or cleared by the [place] FDA and has been authorized for detection and/or diagnosis of SARS-CoV-2 by FDA under an Emergency Use Authorization (EUA). This EUA will remain in effect (meaning this test can be used) for the duration of the COVID-19 declaration under Section 564(b)(1) of the Act, 21 U.S.C. section 360bbb-3(b)(1), unless the authorization is terminated or revoked.     Resp Syncytial Virus by PCR NEGATIVE NEGATIVE Final    Comment: (NOTE) Fact Sheet for Patients: EntrepreneurPulse.com.au  Fact Sheet for Healthcare Providers: IncredibleEmployment.be  This test is not yet approved or cleared by the [place] FDA and has been authorized for detection and/or diagnosis of SARS-CoV-2 by FDA under an Emergency Use Authorization (EUA). This EUA will remain in effect (meaning this test can be used) for the duration of the COVID-19 declaration under Section 564(b)(1) of the Act, 21 U.S.C. section 360bbb-3(b)(1), unless the authorization is terminated or revoked.  Performed at [facility], [address] [place], [name] [address]   MRSA Next Gen by PCR, Nasal  Status: None   Collection Time: 04/19/22  1:26 PM   Specimen: Nasal Mucosa; Nasal Swab  Result Value Ref Range Status   MRSA by PCR Next Gen NOT DETECTED NOT DETECTED Final    Comment: (NOTE) The GeneXpert MRSA Assay (FDA approved for NASAL specimens only), is one component of a comprehensive MRSA colonization surveillance program. It is not intended to diagnose MRSA infection nor to guide or monitor treatment for MRSA infections. Test performance is not FDA approved in patients less than 47 years old. Performed at [facility], [place] [address], [place], [place] [address]     Alcide Evener, [place] [facility] [place] [facility] [phone] pager  04/20/2022, 9:50 AM

## 2022-04-20 NOTE — Progress Notes (Signed)
PHARMACY - PHYSICIAN COMMUNICATION CRITICAL VALUE ALERT - BLOOD CULTURE IDENTIFICATION (BCID)  Adrian Rios is an 82 y.o. male who presented to [facility] on 04/19/2022 with sepsis, acute pyelonephritis, and concern for ESBL UTI.  PMH significant for recurrent UTI and prior ESBL UTI.    Assessment: BCID + Klebsiella pneumoniae (no resistance detected) in 1/4 bottles  Name of physician (or Provider) Contacted: Gershon Cull, FNP  Current antibiotics: Meropenem, Vancomycin  Changes to prescribed antibiotics recommended:  No change at current time.  Awaiting final results of urine culture before modifying antibiotics.  Results for orders placed or performed during the hospital encounter of 04/19/22  Blood Culture ID Panel (Reflexed) (Collected: 04/19/2022  1:00 AM)  Result Value Ref Range   Enterococcus faecalis NOT DETECTED NOT DETECTED   Enterococcus Faecium NOT DETECTED NOT DETECTED   Listeria monocytogenes NOT DETECTED NOT DETECTED   Staphylococcus species NOT DETECTED NOT DETECTED   Staphylococcus aureus (BCID) NOT DETECTED NOT DETECTED   Staphylococcus epidermidis NOT DETECTED NOT DETECTED   Staphylococcus lugdunensis NOT DETECTED NOT DETECTED   Streptococcus species NOT DETECTED NOT DETECTED   Streptococcus agalactiae NOT DETECTED NOT DETECTED   Streptococcus pneumoniae NOT DETECTED NOT DETECTED   Streptococcus pyogenes NOT DETECTED NOT DETECTED   A.calcoaceticus-baumannii NOT DETECTED NOT DETECTED   Bacteroides fragilis NOT DETECTED NOT DETECTED   Enterobacterales DETECTED (A) NOT DETECTED   Enterobacter cloacae complex NOT DETECTED NOT DETECTED   Escherichia coli NOT DETECTED NOT DETECTED   Klebsiella aerogenes NOT DETECTED NOT DETECTED   Klebsiella oxytoca NOT DETECTED NOT DETECTED   Klebsiella pneumoniae DETECTED (A) NOT DETECTED   Proteus species NOT DETECTED NOT DETECTED   Salmonella species NOT DETECTED NOT DETECTED   Serratia marcescens NOT DETECTED NOT DETECTED    Haemophilus influenzae NOT DETECTED NOT DETECTED   Neisseria meningitidis NOT DETECTED NOT DETECTED   Pseudomonas aeruginosa NOT DETECTED NOT DETECTED   Stenotrophomonas maltophilia NOT DETECTED NOT DETECTED   Candida albicans NOT DETECTED NOT DETECTED   Candida auris NOT DETECTED NOT DETECTED   Candida glabrata NOT DETECTED NOT DETECTED   Candida krusei NOT DETECTED NOT DETECTED   Candida parapsilosis NOT DETECTED NOT DETECTED   Candida tropicalis NOT DETECTED NOT DETECTED   Cryptococcus neoformans/gattii NOT DETECTED NOT DETECTED   CTX-M ESBL NOT DETECTED NOT DETECTED   Carbapenem resistance IMP NOT DETECTED NOT DETECTED   Carbapenem resistance KPC NOT DETECTED NOT DETECTED   Carbapenem resistance NDM NOT DETECTED NOT DETECTED   Carbapenem resist OXA 48 LIKE NOT DETECTED NOT DETECTED   Carbapenem resistance VIM NOT DETECTED NOT DETECTED    Everette Rank, PharmD 04/20/2022  3:10 AM

## 2022-04-20 NOTE — Progress Notes (Signed)
Referring Physician(s): [place]  Supervising Physician: Jacqulynn Cadet  Patient Status:  [facility] - In-pt  Chief Complaint: Right flank pain, obstructive right ureteral stone   Subjective: Pt resting quietly, has some mild rt flank discomfort; denies fever, N/V   Allergies: Ivp dye [iodinated contrast media], Metrizamide, Shellfish allergy, Gabapentin, Niacin-lovastatin er, Gadolinium, and Adhesive [tape]  Medications: Prior to Admission medications   Medication Sig Start Date End Date Taking? Authorizing Provider  acetaminophen (TYLENOL) 500 MG tablet Take 1,000 mg by mouth as needed for headache (pain).   Yes [provider]  ADVAIR DISKUS 250-50 MCG/ACT AEPB INHALE 1 PUFF INTO THE LUNGS EVERY 12 HOURS Patient taking differently: Inhale 1 puff into the lungs daily. 12/11/21  Yes Burns, Claudina Lick, MD  albuterol (VENTOLIN HFA) 108 (90 Base) MCG/ACT inhaler Inhale 2 puffs into the lungs every 6 (six) hours as needed for wheezing or shortness of breath. Patient taking differently: Inhale 1-2 puffs into the lungs every 6 (six) hours as needed for wheezing or shortness of breath. 02/01/20  Yes Burns, Claudina Lick, MD  Ascorbic Acid (VITAMIN C) 1000 MG tablet Take 1,000 mg by mouth daily.   Yes [provider]  cholecalciferol (VITAMIN D3) 25 MCG (1000 UNIT) tablet Take 1,000 Units by mouth daily.   Yes [provider]  Cinnamon 500 MG capsule Take 500 mg by mouth daily.   Yes [provider]  latanoprost (XALATAN) 0.005 % ophthalmic solution Place 1 drop into the right eye at bedtime. 05/06/20  Yes [provider]  magnesium oxide (MAG-OX) 400 MG tablet Take 400 mg by mouth daily with supper.   Yes [provider]  methenamine (HIPREX) 1 g tablet Take 1 tablet (1 g total) by mouth 2 (two) times daily with a meal. 06/21/21  Yes Gonfa, Charlesetta Ivory, MD  Misc Natural Products (GLUCOSAMINE CHOND DOUBLE STR PO) Take 1 tablet by mouth 2 (two) times  daily.   Yes [provider]  montelukast (SINGULAIR) 10 MG tablet TAKE ONE TABLET BY MOUTH EVERY NIGHT AT BEDTIME 10/30/21  Yes Burns, Claudina Lick, MD  triamcinolone (NASACORT) 55 MCG/ACT AERO nasal inhaler Place 2 sprays into the nose at bedtime as needed (allergies).   Yes [provider]  venlafaxine (EFFEXOR) 75 MG tablet TAKE 1 AND 1/2 TABLET BY MOUTH DAILY 01/14/22  Yes Burns, Claudina Lick, MD  omeprazole (PRILOSEC) 40 MG capsule Take 1 capsule (40 mg total) by mouth daily before breakfast. Patient not taking: Reported on 04/19/2022 12/15/21   Levin Erp, PA     Vital Signs: BP (!) 104/59   Pulse 90   Temp 98.9 F (37.2 C)   Resp 20   Ht 5' 7"$  (1.702 m)   Wt 179 lb 14.3 oz (81.6 kg)   SpO2 96%   BMI 28.18 kg/m   Physical Exam awake/alert; right PCN intact, insertion site ok, mildly tender, OP 940 cc yellow urine, drain flushes ok  Imaging: IR NEPHROSTOMY PLACEMENT RIGHT  Result Date: 04/19/2022 INDICATION: Obstructive right ureteral stone, history of ileal conduit with revision EXAM: Placement of right percutaneous nephrostomy tube using ultrasound and fluoroscopic guidance COMPARISON:  None Available. MEDICATIONS: Documented in the EMR ANESTHESIA/SEDATION: Moderate (conscious) sedation was employed during this procedure. A total of versed 0.5 mg and Fentanyl 25 mcg was administered intravenously. Moderate Sedation Time: 18 minutes. The patient's level of consciousness and vital signs were monitored continuously by radiology nursing throughout the procedure under my direct supervision. CONTRAST:  7.5 mL Gadavist-administered into the collecting system(s); patient was premedicated with 50 mg Benadryl IV FLUOROSCOPY TIME:  Fluoroscopy Time: 1.6 minutes (10 mGy) COMPLICATIONS: None immediate. PROCEDURE: Informed written consent was obtained from the patient after a thorough discussion of the procedural risks, benefits and alternatives. All questions were addressed.  Maximal Sterile Barrier Technique was utilized including caps, mask, sterile gowns, sterile gloves, sterile drape, hand hygiene and skin antiseptic. A timeout was performed prior to the initiation of the procedure. The patient was placed prone on the exam table. The right flank was prepped and draped in the standard sterile fashion. Ultrasound was used to evaluate the right kidney, which demonstrated hydronephrosis. Skin entry site was marked, and local analgesia was obtained with 1% lidocaine. Using ultrasound guidance, an appropriate lower pole posterior calyx was punctured using a 21-gauge Chiba needle. Entry into the collecting system was confirmed with return of urine, and gentle injection of contrast material under fluoroscopy opacifying the proximal collecting system. An 018 wire was advanced through the needle into the renal pelvis and proximal ureter, followed by placement of a transition dilator. An antegrade nephrostogram was then performed, which demonstrated hydronephrosis. Over an 035 Amplatz wire, the percutaneous tract was serially dilated, and a 10.2 French nephrostomy tube was advanced with the loop formed in the renal pelvis. Appropriate positioning of the nephrostomy tube was confirmed with injection of contrast material. The nephrostomy tube was secured to skin using silk suture and a dressing. It was attached to bag drainage. The patient tolerated the procedure well without immediate complication. IMPRESSION: Successful placement of a right-sided 10 French percutaneous nephrostomy tube. Nephrostomy tube placed to bag drainage. Electronically Signed   By: Albin Felling M.D.   On: 04/19/2022 13:06   DG Chest Portable 1 View  Result Date: 04/19/2022 CLINICAL DATA:  Hypoxia.  Right flank pain. EXAM: PORTABLE CHEST 1 VIEW COMPARISON:  06/13/2021. FINDINGS: Stable cardiomediastinal contours. Lung volumes are low. No pleural effusion or edema. No airspace opacities. Previous left shoulder  arthroplasty. IMPRESSION: Low lung volumes.  No acute abnormality. Electronically Signed   By: Kerby Moors M.D.   On: 04/19/2022 06:00   CT Renal Stone Study  Result Date: 04/19/2022 CLINICAL DATA:  Flank pain on the right for 2 days, initial encounter EXAM: CT ABDOMEN AND PELVIS WITHOUT CONTRAST TECHNIQUE: Multidetector CT imaging of the abdomen and pelvis was performed following the standard protocol without IV contrast. RADIATION DOSE REDUCTION: This exam was performed according to the departmental dose-optimization program which includes automated exposure control, adjustment of the mA and/or kV according to patient size and/or use of iterative reconstruction technique. COMPARISON:  10/09/2021 FINDINGS: Lower chest: No acute abnormality. Hepatobiliary: No focal liver abnormality is seen. No gallstones, gallbladder wall thickening, or biliary dilatation. Pancreas: Unremarkable. No pancreatic ductal dilatation or surrounding inflammatory changes. Spleen: Normal in size without focal abnormality. Adrenals/Urinary Tract: Adrenal glands are within normal limits. Kidneys are well visualized bilaterally. Stable left renal cyst is noted which is simple in nature. No follow-up is recommended. Bilateral hydronephrosis is noted right greater than left. The left hydronephrosis is stable from the prior study. Previously seen nonobstructing right renal stone has now migrated into the more distal ureter. This is best seen on image number 55 of series 2 and 49 of series 5. This accounts for the increased hydronephrosis and right-sided flank pain. No obstructive changes are noted on the left. Ileal conduit is noted in the right lower quadrant. The bladder has been surgically removed. Stomach/Bowel:  Scattered diverticular changes noted in the colon without evidence of diverticulitis. The appendix has been surgically removed. No obstructive or inflammatory changes of the small bowel are noted. Stomach is within normal  limits. Vascular/Lymphatic: Aortic atherosclerosis. No enlarged abdominal or pelvic lymph nodes. Reproductive: Prostate has been surgically removed as well. Other: No abdominal wall hernia or abnormality. No abdominopelvic ascites. Musculoskeletal: Degenerative changes of lumbar spine are noted. IMPRESSION: Interval migration of a 5 mm stone from the right kidney into the distal right ureter with increased hydronephrosis. Diverticulosis without diverticulitis. Changes of prior cystectomy with ileal conduit placement Electronically Signed   By: Inez Catalina M.D.   On: 04/19/2022 00:55    Labs:  CBC: Recent Labs    10/11/21 0420 04/19/22 0112 04/19/22 0535 04/20/22 0307  WBC 10.5 11.2* 13.1* 13.5*  HGB 11.8* 13.0 9.0* 11.0*  HCT 36.4* 39.1 28.4* 34.0*  PLT 290 280 178 225    COAGS: Recent Labs    06/13/21 2349 04/19/22 0112  INR 1.1 1.1  APTT 34 35    BMP: Recent Labs    10/10/21 0505 10/11/21 0420 04/19/22 0112 04/19/22 0535 04/20/22 0307  NA 142 143 135  --  137  K 3.5 3.8 3.9  --  3.9  CL 115* 114* 110  --  109  CO2 22 24 20*  --  19*  GLUCOSE 109* 106* 151*  --  135*  BUN 15 13 23  $ --  24*  CALCIUM 8.0* 8.6* 8.5*  --  8.5*  CREATININE 1.17 0.97 1.88* 1.60* 1.83*  GFRNONAA >60 >60 35* 43* 37*    LIVER FUNCTION TESTS: Recent Labs    07/23/21 1832 10/09/21 2030 04/19/22 0112 04/20/22 0307  BILITOT 0.4 0.5 0.5 0.7  AST 21 17 30 26  $ ALT 15 13 28 27  $ ALKPHOS 91 94 108 72  PROT 7.5 7.2 7.0 6.2*  ALBUMIN 3.6 3.5 3.4* 3.3*    Assessment and Plan: Pt with hx bladder ca with prior cystectomy/ileal conduit, obstructive rt ureteral stone/sepsis; s/p rt PCN 2/18; afebrile, WBC 13.5(13.1), hgb 11(9), creat 1.83(1.60), urine cx- klebsiella; cont current tx; further plans as outlined by urology   Electronically Signed: D. Rowe Robert, PA-C 04/20/2022, 2:47 PM   I spent a total of 15 Minutes at the the patient's bedside AND on the patient's hospital floor or unit,  greater than 50% of which was counseling/coordinating care for right nephrostomy    Patient ID: CRUIZ IMPERIAL, male   DOB: 1940-12-07, 82 y.o.   MRN: [id number]

## 2022-04-20 NOTE — Progress Notes (Signed)
PROGRESS NOTE    Adrian Rios  [id number] DOB: [birth year] DOA: 04/19/2022 PCP: [name], MD    Chief Complaint  Patient presents with   Flank Pain    Brief Narrative:  Patient 82 year old gentleman history of bladder cancer status post ileal conduit, chronic bronchitis unknown etiology, history of ESBL, depression presented to the ED with fever right flank pain with temperature as high as 103.  Patient seen in the ED admitted early on this morning and on presentation noted to be tachycardic with heart rates in the 130s, hypotensive with maps in the 60s, placed on volume resuscitation.  Noted to be in acute renal failure, noted to have a leukocytosis.  CT abdomen and pelvis done with a 5 mm stone in the distal right ureter with hydronephrosis.  Urinalysis done concerning for UTI.  Patient pancultured.  Patient placed empirically on IV meropenem.  Urology consulted.  Urology recommended percutaneous nephrostomy tube placement by IR which was done 04/19/2022.  Blood cultures now consistent with a Klebsiella pneumonia bacteremia.  ID consulted.   Assessment & Plan:   Principal Problem:   Sepsis associated hypotension (HCC) Active Problems:   Acute pyelonephritis   Acute respiratory failure with hypoxia (HCC)   History of bladder cancer   Complicated UTI (urinary tract infection)   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HCC)   Chronic pain of both shoulders   Severe sepsis with acute organ dysfunction (HCC)   Ureteral stone with hydronephrosis   ARF (acute renal failure) (HCC)   Anemia   Bacteremia due to Klebsiella pneumoniae   Acute nonintractable headache  #1 severe sepsis secondary to acute pyelonephritis/probable ESBL UTI with some associated hypotension, klebsiella paneumoniae bacteremia POA -Patient presented with right flank pain, fever temp of 103, CBC done with a leukocytosis white count as high as 13.1 this morning.  Patient noted to be tachycardic on presentation with heart rate in the  130s.   -Urinalysis done with moderate leukocytes, positive nitrite, many bacteria, 11-20 WBCs. -Blood cultures, urine cultures pending. -Patient initially given aggressive fluid resuscitation due to hypotension with improvement with blood pressure however patient developed acute respiratory failure with hypoxia and as such IV fluids have been discontinued and patient received a dose of Lasix. -Systolic blood pressure in the low 100s. -2/4 blood cultures positive for Klebsiella pneumonie. -Urine cultures pending. -BP with some improvement status post IV albumin every 6 hours x 1 day. -Continue empiric IV Merrem, IV vancomycin. -Patient was seen in consultation by urology who recommended percutaneous nephrostomy tube placement by IR which was done on 04/19/2022 without any complications. -Due to complex urinary history, right ureteral stone with hydronephrosis, prior history of ESBL UTI, now with a bacteremia which likely seeded from urine will consult with ID for further evaluation and management. -Urology following and appreciate input and recommendations.  2.  Acute hypoxic respiratory failure secondary to hypovolemia in the setting of chronic bronchitis, POA -Noted in the ED to have increased O2 requirements as high as 8 L O2 after volume resuscitation per admitting physician patient looked hypervolemic on examination. -IV fluids discontinued patient given a dose of Lasix 20 mg IV x 1. -Patient also started on Pulmicort and Brovana nebs as well as DuoNebs. -Currently on 2.5 L nasal cannula with sats of 96%.. -Continue empiric IV antibiotics. -Monitor closely with gentle hydration.  3.  GERD -Continue PPI.   4.  Depression -Continue home regimen Effexor.  5.  Acute renal failure -Likely multifactorial secondary to postrenal azotemia  secondary to ureteral stone with hydronephrosis in the setting of dehydration as patient noted to be hypotensive on presentation. -Urinalysis with moderate  leukocytes, positive nitrites, many bacteria, negative for protein, negative for glucose, WBC 11-20. -Patient aggressively hydrated with IV fluid however went to acute hypoxic respiratory failure and as such IV fluids discontinued and patient received a dose of Lasix. -Urology assessed patient in consultation and recommended percutaneous nephrostomy tube placement which was done per IR on 04/19/2022.   -Urine output of 1.820 L over the past 24 hours.  -Creatinine still elevated at 1.83 post percutaneous nephrostomy placement.  -Place on gentle hydration.  -Monitor renal function.  6.  Right ureteral stone with hydronephrosis -Patient presented right flank pain, fever chills, CT renal stone protocol with right ureteral stone with hydronephrosis. -Patient seen in consultation by urology who recommended percutaneous nephrostomy tube placement per IR which was done 04/19/2022.   7.  Anemia -Likely dilutional. -Patient denies any overt bleeding. -Anemia panel consistent with anemia of chronic disease, iron of 19, TIBC of 174, ferritin 156, folate 15.3, vitamin B12 415.  -Hemoglobin currently at 11.0.  -Transfusion threshold hemoglobin < 8  8.  History of bladder cancer status post ileal conduit -Outpatient follow-up with urology. -Urology following.  9.  Headache -Oxycodone as needed however per RN patient still with complaints of headache. -IV Reglan x 1. -IV Dilaudid 0.5 mg x 1.   DVT prophylaxis: Lovenox Code Status: Full Family Communication: Updated patient and wife at bedside. Disposition: Remain in stepdown unit today.  Likely home when clinically improved and cleared by urology.  Status is: Inpatient Remains inpatient appropriate because: Severity of illness   Consultants:  Urology: Dr. Alyson Ingles 04/19/2022 Interventional radiology: Dr. Denna Haggard 04/19/2022  Procedures: CT renal stone protocol 04/19/2022 Chest x-ray 04/19/2022 PCN placement right side 10 French tube bag drain per  IR 04/19/2022  Antimicrobials: IV cefepime 04/19/2022 x 1 IV Merrem 04/19/2022>>>> IV vancomycin 04/19/2022>>>>>   Subjective: Patient laying in bed.  Overall states he feels better than on admission however still with some right flank pain/discomfort.  Status post percutaneous nephrostomy placement on the right 04/19/2022.  Denies any chest pain.  Denies any shortness of breath.  No nausea or vomiting.  Complaining of a headache which she states is a 5/10 at this time.  Per RN oxycodone not helping manage headache.  Wife at bedside.    Objective: Vitals:   04/20/22 0500 04/20/22 0600 04/20/22 0747 04/20/22 0800  BP: (!) 118/59 101/63  (!) 102/54  Pulse: 78 79  74  Resp: (!) 22 (!) 24  18  Temp:    99 F (37.2 C)  TempSrc:    Oral  SpO2: 96% 95% 94% 96%  Weight:      Height:        Intake/Output Summary (Last 24 hours) at 04/20/2022 0957 Last data filed at 04/19/2022 2300 Gross per 24 hour  Intake --  Output 1820 ml  Net -1820 ml   Filed Weights   04/19/22 0015 04/19/22 0937 04/19/22 1337  Weight: 81.6 kg 81.6 kg 81.6 kg    Examination:  General exam: NAD. Respiratory system: CTAB.  No wheezes, no crackles, no rhonchi.  Fair air movement.  Speaking in full sentences.   Cardiovascular system: RRR no murmurs rubs or gallops.  No JVD.  No lower extremity edema.   Gastrointestinal system: Abdomen is soft, nondistended, nontender, positive bowel sounds.  Decreased right CVA TTP.  Percutaneous nephrostomy tube noted on the right.  Ileostomy with clear urine noted. Central nervous system: Alert and oriented. No focal neurological deficits. Extremities: Symmetric 5 x 5 power. Skin: No rashes, lesions or ulcers Psychiatry: Judgement and insight appear normal. Mood & affect appropriate.     Data Reviewed: I have personally reviewed following labs and imaging studies  CBC: Recent Labs  Lab 04/19/22 0112 04/19/22 0535 04/20/22 0307  WBC 11.2* 13.1* 13.5*  NEUTROABS 9.6*  --   11.2*  HGB 13.0 9.0* 11.0*  HCT 39.1 28.4* 34.0*  MCV 92.7 99.0 97.4  PLT 280 178 [id number]    Basic Metabolic Panel: Recent Labs  Lab 04/19/22 0112 04/19/22 0535 04/20/22 0307  NA 135  --  137  K 3.9  --  3.9  CL 110  --  109  CO2 20*  --  19*  GLUCOSE 151*  --  135*  BUN 23  --  24*  CREATININE 1.88* 1.60* 1.83*  CALCIUM 8.5*  --  8.5*  MG  --   --  2.0    GFR: Estimated Creatinine Clearance: 32.4 mL/min (A) (by C-G formula based on SCr of 1.83 mg/dL (H)).  Liver Function Tests: Recent Labs  Lab 04/19/22 0112 04/20/22 0307  AST 30 26  ALT 28 27  ALKPHOS 108 72  BILITOT 0.5 0.7  PROT 7.0 6.2*  ALBUMIN 3.4* 3.3*    CBG: No results for input(s): "GLUCAP" in the last 168 hours.   Recent Results (from the past 240 hour(s))  Blood Culture (routine x 2)     Status: None (Preliminary result)   Collection Time: 04/19/22 12:55 AM   Specimen: BLOOD LEFT FOREARM  Result Value Ref Range Status   Specimen Description   Final    BLOOD LEFT FOREARM Performed at [facility], [address] [place], [name] [address]    Special Requests   Final    BOTTLES DRAWN AEROBIC AND ANAEROBIC Blood Culture adequate volume Performed at [facility], [address] [place], [name] [address]    Culture   Final    NO GROWTH < 24 HOURS Performed at [facility], [place] [address] [place], [place] [address]    Report Status PENDING  Incomplete  Blood Culture (routine x 2)     Status: Abnormal (Preliminary result)   Collection Time: 04/19/22  1:00 AM   Specimen: Right Antecubital; Blood  Result Value Ref Range Status   Specimen Description   Final    RIGHT ANTECUBITAL BLOOD Performed at [facility], [place] [address], [place], [place] [address]    Special Requests   Final    BOTTLES DRAWN AEROBIC AND ANAEROBIC Blood Culture adequate volume Performed at [facility], [address] [place], [name] [address]    Culture  Setup Time    Final    GRAM NEGATIVE RODS IN BOTH AEROBIC AND ANAEROBIC BOTTLES CRITICAL RESULT CALLED TO, READ BACK BY AND VERIFIED WITH: L POINDEXTER,PHARMD@0243$  04/20/22 [place]    Culture (A)  Final    KLEBSIELLA PNEUMONIAE SUSCEPTIBILITIES TO FOLLOW Performed at [facility], [place] [address] [place], [place] [address]    Report Status PENDING  Incomplete  Blood Culture ID Panel (Reflexed)     Status: Abnormal   Collection Time: 04/19/22  1:00 AM  Result Value Ref Range Status   Enterococcus faecalis NOT DETECTED NOT DETECTED Final   Enterococcus Faecium NOT DETECTED NOT DETECTED Final   Listeria monocytogenes  NOT DETECTED NOT DETECTED Final   Staphylococcus species NOT DETECTED NOT DETECTED Final   Staphylococcus aureus (BCID) NOT DETECTED NOT DETECTED Final   Staphylococcus epidermidis NOT DETECTED NOT DETECTED Final   Staphylococcus lugdunensis NOT DETECTED NOT DETECTED Final   Streptococcus species NOT DETECTED NOT DETECTED Final   Streptococcus agalactiae NOT DETECTED NOT DETECTED Final   Streptococcus pneumoniae NOT DETECTED NOT DETECTED Final   Streptococcus pyogenes NOT DETECTED NOT DETECTED Final   A.calcoaceticus-baumannii NOT DETECTED NOT DETECTED Final   Bacteroides fragilis NOT DETECTED NOT DETECTED Final   Enterobacterales DETECTED (A) NOT DETECTED Final    Comment: Enterobacterales represent a large order of gram negative bacteria, not a single organism. CRITICAL RESULT CALLED TO, READ BACK BY AND VERIFIED WITH: L POINDEXTER,PHARMD@0245$  04/20/22 [place]    Enterobacter cloacae complex NOT DETECTED NOT DETECTED Final   Escherichia coli NOT DETECTED NOT DETECTED Final   Klebsiella aerogenes NOT DETECTED NOT DETECTED Final   Klebsiella oxytoca NOT DETECTED NOT DETECTED Final   Klebsiella pneumoniae DETECTED (A) NOT DETECTED Final    Comment: CRITICAL RESULT CALLED TO, READ BACK BY AND VERIFIED WITH: L POINDEXTER,PHARMD@0244$  04/20/22 [place]    Proteus species NOT DETECTED NOT  DETECTED Final   Salmonella species NOT DETECTED NOT DETECTED Final   Serratia marcescens NOT DETECTED NOT DETECTED Final   Haemophilus influenzae NOT DETECTED NOT DETECTED Final   Neisseria meningitidis NOT DETECTED NOT DETECTED Final   Pseudomonas aeruginosa NOT DETECTED NOT DETECTED Final   Stenotrophomonas maltophilia NOT DETECTED NOT DETECTED Final   Candida albicans NOT DETECTED NOT DETECTED Final   Candida auris NOT DETECTED NOT DETECTED Final   Candida glabrata NOT DETECTED NOT DETECTED Final   Candida krusei NOT DETECTED NOT DETECTED Final   Candida parapsilosis NOT DETECTED NOT DETECTED Final   Candida tropicalis NOT DETECTED NOT DETECTED Final   Cryptococcus neoformans/gattii NOT DETECTED NOT DETECTED Final   CTX-M ESBL NOT DETECTED NOT DETECTED Final   Carbapenem resistance IMP NOT DETECTED NOT DETECTED Final   Carbapenem resistance KPC NOT DETECTED NOT DETECTED Final   Carbapenem resistance NDM NOT DETECTED NOT DETECTED Final   Carbapenem resist OXA 48 LIKE NOT DETECTED NOT DETECTED Final   Carbapenem resistance VIM NOT DETECTED NOT DETECTED Final    Comment: Performed at [facility], [address] [place], [place] [address]  Resp panel by RT-PCR (RSV, Flu A&B, Covid) Anterior Nasal Swab     Status: None   Collection Time: 04/19/22  1:13 AM   Specimen: Anterior Nasal Swab  Result Value Ref Range Status   SARS Coronavirus 2 by RT PCR NEGATIVE NEGATIVE Final    Comment: (NOTE) SARS-CoV-2 target nucleic acids are NOT DETECTED.  The SARS-CoV-2 RNA is generally detectable in upper respiratory specimens during the acute phase of infection. The lowest concentration of SARS-CoV-2 viral copies this assay can detect is 138 copies/mL. A negative result does not preclude SARS-Cov-2 infection and should not be used as the sole basis for treatment or other patient management decisions. A negative result may occur with  improper specimen collection/handling, submission of  specimen other than nasopharyngeal swab, presence of viral mutation(s) within the areas targeted by this assay, and inadequate number of viral copies(<138 copies/mL). A negative result must be combined with clinical observations, patient history, and epidemiological information. The expected result is Negative.  Fact Sheet for Patients:  EntrepreneurPulse.com.au  Fact Sheet for Healthcare Providers:  IncredibleEmployment.be  This test is no t yet approved or  cleared by the [place] and  has been authorized for detection and/or diagnosis of SARS-CoV-2 by FDA under an Emergency Use Authorization (EUA). This EUA will remain  in effect (meaning this test can be used) for the duration of the COVID-19 declaration under Section 564(b)(1) of the Act, 21 U.S.C.section 360bbb-3(b)(1), unless the authorization is terminated  or revoked sooner.       Influenza A by PCR NEGATIVE NEGATIVE Final   Influenza B by PCR NEGATIVE NEGATIVE Final    Comment: (NOTE) The Xpert Xpress SARS-CoV-2/FLU/RSV plus assay is intended as an aid in the diagnosis of influenza from Nasopharyngeal swab specimens and should not be used as a sole basis for treatment. Nasal washings and aspirates are unacceptable for Xpert Xpress SARS-CoV-2/FLU/RSV testing.  Fact Sheet for Patients: EntrepreneurPulse.com.au  Fact Sheet for Healthcare Providers: IncredibleEmployment.be  This test is not yet approved or cleared by the [place] FDA and has been authorized for detection and/or diagnosis of SARS-CoV-2 by FDA under an Emergency Use Authorization (EUA). This EUA will remain in effect (meaning this test can be used) for the duration of the COVID-19 declaration under Section 564(b)(1) of the Act, 21 U.S.C. section 360bbb-3(b)(1), unless the authorization is terminated or revoked.     Resp Syncytial Virus by PCR NEGATIVE NEGATIVE Final     Comment: (NOTE) Fact Sheet for Patients: EntrepreneurPulse.com.au  Fact Sheet for Healthcare Providers: IncredibleEmployment.be  This test is not yet approved or cleared by the [place] FDA and has been authorized for detection and/or diagnosis of SARS-CoV-2 by FDA under an Emergency Use Authorization (EUA). This EUA will remain in effect (meaning this test can be used) for the duration of the COVID-19 declaration under Section 564(b)(1) of the Act, 21 U.S.C. section 360bbb-3(b)(1), unless the authorization is terminated or revoked.  Performed at [facility], [address] [place], [name] [address]   MRSA Next Gen by PCR, Nasal     Status: None   Collection Time: 04/19/22  1:26 PM   Specimen: Nasal Mucosa; Nasal Swab  Result Value Ref Range Status   MRSA by PCR Next Gen NOT DETECTED NOT DETECTED Final    Comment: (NOTE) The GeneXpert MRSA Assay (FDA approved for NASAL specimens only), is one component of a comprehensive MRSA colonization surveillance program. It is not intended to diagnose MRSA infection nor to guide or monitor treatment for MRSA infections. Test performance is not FDA approved in patients less than 30 years old. Performed at [facility], [place] [address], [place], [place] [address]          Radiology Studies: IR NEPHROSTOMY PLACEMENT RIGHT  Result Date: 04/19/2022 INDICATION: Obstructive right ureteral stone, history of ileal conduit with revision EXAM: Placement of right percutaneous nephrostomy tube using ultrasound and fluoroscopic guidance COMPARISON:  None Available. MEDICATIONS: Documented in the EMR ANESTHESIA/SEDATION: Moderate (conscious) sedation was employed during this procedure. A total of versed 0.5 mg and Fentanyl 25 mcg was administered intravenously. Moderate Sedation Time: 18 minutes. The patient's level of consciousness and vital signs were monitored continuously  by radiology nursing throughout the procedure under my direct supervision. CONTRAST:  7.5 mL Gadavist-administered into the collecting system(s); patient was premedicated with 50 mg Benadryl IV FLUOROSCOPY TIME:  Fluoroscopy Time: 1.6 minutes (10 mGy) COMPLICATIONS: None immediate. PROCEDURE: Informed written consent was obtained from the patient after a thorough discussion of the procedural risks, benefits and alternatives. All questions were addressed. Maximal Sterile Barrier Technique was utilized  including caps, mask, sterile gowns, sterile gloves, sterile drape, hand hygiene and skin antiseptic. A timeout was performed prior to the initiation of the procedure. The patient was placed prone on the exam table. The right flank was prepped and draped in the standard sterile fashion. Ultrasound was used to evaluate the right kidney, which demonstrated hydronephrosis. Skin entry site was marked, and local analgesia was obtained with 1% lidocaine. Using ultrasound guidance, an appropriate lower pole posterior calyx was punctured using a 21-gauge Chiba needle. Entry into the collecting system was confirmed with return of urine, and gentle injection of contrast material under fluoroscopy opacifying the proximal collecting system. An 018 wire was advanced through the needle into the renal pelvis and proximal ureter, followed by placement of a transition dilator. An antegrade nephrostogram was then performed, which demonstrated hydronephrosis. Over an 035 Amplatz wire, the percutaneous tract was serially dilated, and a 10.2 French nephrostomy tube was advanced with the loop formed in the renal pelvis. Appropriate positioning of the nephrostomy tube was confirmed with injection of contrast material. The nephrostomy tube was secured to skin using silk suture and a dressing. It was attached to bag drainage. The patient tolerated the procedure well without immediate complication. IMPRESSION: Successful placement of a  right-sided 10 French percutaneous nephrostomy tube. Nephrostomy tube placed to bag drainage. Electronically Signed   By: Albin Felling M.D.   On: 04/19/2022 13:06   DG Chest Portable 1 View  Result Date: 04/19/2022 CLINICAL DATA:  Hypoxia.  Right flank pain. EXAM: PORTABLE CHEST 1 VIEW COMPARISON:  06/13/2021. FINDINGS: Stable cardiomediastinal contours. Lung volumes are low. No pleural effusion or edema. No airspace opacities. Previous left shoulder arthroplasty. IMPRESSION: Low lung volumes.  No acute abnormality. Electronically Signed   By: Kerby Moors M.D.   On: 04/19/2022 06:00   CT Renal Stone Study  Result Date: 04/19/2022 CLINICAL DATA:  Flank pain on the right for 2 days, initial encounter EXAM: CT ABDOMEN AND PELVIS WITHOUT CONTRAST TECHNIQUE: Multidetector CT imaging of the abdomen and pelvis was performed following the standard protocol without IV contrast. RADIATION DOSE REDUCTION: This exam was performed according to the departmental dose-optimization program which includes automated exposure control, adjustment of the mA and/or kV according to patient size and/or use of iterative reconstruction technique. COMPARISON:  10/09/2021 FINDINGS: Lower chest: No acute abnormality. Hepatobiliary: No focal liver abnormality is seen. No gallstones, gallbladder wall thickening, or biliary dilatation. Pancreas: Unremarkable. No pancreatic ductal dilatation or surrounding inflammatory changes. Spleen: Normal in size without focal abnormality. Adrenals/Urinary Tract: Adrenal glands are within normal limits. Kidneys are well visualized bilaterally. Stable left renal cyst is noted which is simple in nature. No follow-up is recommended. Bilateral hydronephrosis is noted right greater than left. The left hydronephrosis is stable from the prior study. Previously seen nonobstructing right renal stone has now migrated into the more distal ureter. This is best seen on image number 55 of series 2 and 49 of  series 5. This accounts for the increased hydronephrosis and right-sided flank pain. No obstructive changes are noted on the left. Ileal conduit is noted in the right lower quadrant. The bladder has been surgically removed. Stomach/Bowel: Scattered diverticular changes noted in the colon without evidence of diverticulitis. The appendix has been surgically removed. No obstructive or inflammatory changes of the small bowel are noted. Stomach is within normal limits. Vascular/Lymphatic: Aortic atherosclerosis. No enlarged abdominal or pelvic lymph nodes. Reproductive: Prostate has been surgically removed as well. Other: No abdominal wall hernia or  abnormality. No abdominopelvic ascites. Musculoskeletal: Degenerative changes of lumbar spine are noted. IMPRESSION: Interval migration of a 5 mm stone from the right kidney into the distal right ureter with increased hydronephrosis. Diverticulosis without diverticulitis. Changes of prior cystectomy with ileal conduit placement Electronically Signed   By: Inez Catalina M.D.   On: 04/19/2022 00:55        Scheduled Meds:  arformoterol  15 mcg Nebulization BID   budesonide (PULMICORT) nebulizer solution  0.25 mg Nebulization BID   Chlorhexidine Gluconate Cloth  6 each Topical Daily   enoxaparin (LOVENOX) injection  40 mg Subcutaneous Q24H    HYDROmorphone (DILAUDID) injection  0.5 mg Intravenous Once   latanoprost  1 drop Right Eye QHS   magnesium oxide  400 mg Oral Q supper   metoCLOPramide (REGLAN) injection  5 mg Intravenous Once   montelukast  10 mg Oral QHS   pantoprazole  40 mg Oral Daily   venlafaxine  112.5 mg Oral Daily   Continuous Infusions:  sodium chloride 10 mL/hr (04/19/22 1758)   sodium chloride     [START ON 04/21/2022] cefTRIAXone (ROCEPHIN)  IV     meropenem (MERREM) IV Stopped (04/20/22 [id number])     LOS: 1 day    Time spent: 40 minutes    Irine Seal, MD [facility]   To contact the attending provider between 7A-7P  or the covering provider during after hours 7P-7A, please log into the web site [email or web address] and access using universal [place] password for that web site. If you do not have the password, please call the hospital operator.  04/20/2022, 9:57 AM

## 2022-04-21 DIAGNOSIS — A419 Sepsis, unspecified organism: Secondary | ICD-10-CM | POA: Diagnosis not present

## 2022-04-21 DIAGNOSIS — N10 Acute tubulo-interstitial nephritis: Secondary | ICD-10-CM | POA: Diagnosis not present

## 2022-04-21 DIAGNOSIS — G8929 Other chronic pain: Secondary | ICD-10-CM

## 2022-04-21 DIAGNOSIS — Z8551 Personal history of malignant neoplasm of bladder: Secondary | ICD-10-CM | POA: Diagnosis not present

## 2022-04-21 DIAGNOSIS — N132 Hydronephrosis with renal and ureteral calculous obstruction: Secondary | ICD-10-CM | POA: Diagnosis not present

## 2022-04-21 DIAGNOSIS — M25511 Pain in right shoulder: Secondary | ICD-10-CM | POA: Diagnosis not present

## 2022-04-21 DIAGNOSIS — M25512 Pain in left shoulder: Secondary | ICD-10-CM

## 2022-04-21 DIAGNOSIS — K5900 Constipation, unspecified: Secondary | ICD-10-CM

## 2022-04-21 DIAGNOSIS — D649 Anemia, unspecified: Secondary | ICD-10-CM | POA: Diagnosis not present

## 2022-04-21 DIAGNOSIS — J9601 Acute respiratory failure with hypoxia: Secondary | ICD-10-CM | POA: Diagnosis not present

## 2022-04-21 LAB — CBC WITH DIFFERENTIAL/PLATELET
Abs Immature Granulocytes: 0.06 10*3/uL (ref 0.00–0.07)
Basophils Absolute: 0 10*3/uL (ref 0.0–0.1)
Basophils Relative: 0 %
Eosinophils Absolute: 0.3 10*3/uL (ref 0.0–0.5)
Eosinophils Relative: 2 %
HCT: 35 % — ABNORMAL LOW (ref 39.0–52.0)
Hemoglobin: 11.1 g/dL — ABNORMAL LOW (ref 13.0–17.0)
Immature Granulocytes: 1 %
Lymphocytes Relative: 15 %
Lymphs Abs: 1.8 10*3/uL (ref 0.7–4.0)
MCH: 30.3 pg (ref 26.0–34.0)
MCHC: 31.7 g/dL (ref 30.0–36.0)
MCV: 95.6 fL (ref 80.0–100.0)
Monocytes Absolute: 1 10*3/uL (ref 0.1–1.0)
Monocytes Relative: 9 %
Neutro Abs: 8.9 10*3/uL — ABNORMAL HIGH (ref 1.7–7.7)
Neutrophils Relative %: 73 %
Platelets: 225 10*3/uL (ref 150–400)
RBC: 3.66 MIL/uL — ABNORMAL LOW (ref 4.22–5.81)
RDW: 13.2 % (ref 11.5–15.5)
WBC: 12 10*3/uL — ABNORMAL HIGH (ref 4.0–10.5)
nRBC: 0 % (ref 0.0–0.2)

## 2022-04-21 LAB — URINE CULTURE: Culture: >=100000 — AB

## 2022-04-21 LAB — RENAL FUNCTION PANEL
Albumin: 3.2 g/dL — ABNORMAL LOW (ref 3.5–5.0)
Anion gap: 10 (ref 5–15)
BUN: 21 mg/dL (ref 8–23)
CO2: 21 mmol/L — ABNORMAL LOW (ref 22–32)
Calcium: 8.5 mg/dL — ABNORMAL LOW (ref 8.9–10.3)
Chloride: 106 mmol/L (ref 98–111)
Creatinine, Ser: 1.34 mg/dL — ABNORMAL HIGH (ref 0.61–1.24)
GFR, Estimated: 53 mL/min — ABNORMAL LOW (ref >60)
Glucose, Bld: 110 mg/dL — ABNORMAL HIGH (ref 70–99)
Phosphorus: 2.5 mg/dL (ref 2.5–4.6)
Potassium: 3.7 mmol/L (ref 3.5–5.1)
Sodium: 137 mmol/L (ref 135–145)

## 2022-04-21 LAB — CULTURE, BLOOD (ROUTINE X 2): Special Requests: ADEQUATE

## 2022-04-21 MED ORDER — SODIUM CHLORIDE 0.9 % IV SOLN: INTRAVENOUS | Status: AC

## 2022-04-21 MED ORDER — BENZONATATE 100 MG PO CAPS: 100.0000 mg | ORAL_CAPSULE | Freq: Three times a day (TID) | ORAL | Status: DC | PRN

## 2022-04-21 MED ORDER — SORBITOL 70 % SOLN
30.0000 mL | Status: AC
  Administered 2022-04-21: 30 mL via ORAL
  Filled 2022-04-21: qty 30

## 2022-04-21 MED ORDER — SORBITOL 70 % SOLN: 30.0000 mL | Freq: Every day | Status: DC | PRN

## 2022-04-21 MED ORDER — POLYETHYLENE GLYCOL 3350 17 G PO PACK
17.0000 g | PACK | Freq: Two times a day (BID) | ORAL | Status: DC
  Administered 2022-04-21 – 2022-04-22 (×2): 17 g via ORAL
  Filled 2022-04-21 (×3): qty 1

## 2022-04-21 NOTE — Progress Notes (Signed)
Subjective: No new complaints   Antibiotics:  Anti-infectives (From admission, onward)    Start     Dose/Rate Route Frequency Ordered Stop   04/21/22 1000  cefTRIAXone (ROCEPHIN) 2 g in sodium chloride 0.9 % 100 mL IVPB        2 g 200 mL/hr over 30 Minutes Intravenous Every 24 hours 04/20/22 0928     04/20/22 1800  vancomycin (VANCOREADY) IVPB 1250 mg/250 mL  Status:  Discontinued        1,250 mg 166.7 mL/hr over 90 Minutes Intravenous Every 36 hours 04/19/22 0544 04/20/22 0928   04/19/22 0600  meropenem (MERREM) 1 g in sodium chloride 0.9 % 100 mL IVPB        1 g 200 mL/hr over 30 Minutes Intravenous Every 12 hours 04/19/22 0518 04/20/22 1818   04/19/22 0545  vancomycin (VANCOREADY) IVPB 1750 mg/350 mL        1,750 mg 175 mL/hr over 120 Minutes Intravenous  Once 04/19/22 0540 04/19/22 0850   04/19/22 0030  ceFEPIme (MAXIPIME) 2 g in sodium chloride 0.9 % 100 mL IVPB        2 g 200 mL/hr over 30 Minutes Intravenous  Once 04/19/22 0027 04/19/22 0157       Medications: Scheduled Meds:  arformoterol  15 mcg Nebulization BID   budesonide (PULMICORT) nebulizer solution  0.25 mg Nebulization BID   Chlorhexidine Gluconate Cloth  6 each Topical Daily   enoxaparin (LOVENOX) injection  40 mg Subcutaneous Q24H   latanoprost  1 drop Right Eye QHS   magnesium oxide  400 mg Oral Q supper   montelukast  10 mg Oral QHS   pantoprazole  40 mg Oral Daily   polyethylene glycol  17 g Oral BID   senna-docusate  1 tablet Oral BID   sorbitol  30 mL Oral Q3H   venlafaxine  112.5 mg Oral Daily   Continuous Infusions:  sodium chloride Stopped (04/20/22 1026)   sodium chloride 50 mL/hr at 04/21/22 1226   cefTRIAXone (ROCEPHIN)  IV Stopped (04/21/22 1415)   PRN Meds:.sodium chloride, acetaminophen **OR** acetaminophen, benzonatate, HYDROmorphone (DILAUDID) injection, ipratropium-albuterol, ondansetron **OR** ondansetron (ZOFRAN) IV, mouth rinse, oxyCODONE    Objective: Weight  change: 0.3 kg  Intake/Output Summary (Last 24 hours) at 04/21/2022 1512 Last data filed at 04/21/2022 1422 Gross per 24 hour  Intake 2549.36 ml  Output 3500 ml  Net -950.64 ml   Blood pressure 129/75, pulse 79, temperature 97.9 F (36.6 C), temperature source Oral, resp. rate 20, height 5' 7"$  (1.702 m), weight 81.9 kg, SpO2 (!) 89 %. Temp:  [97.9 F (36.6 C)-99.8 F (37.7 C)] 97.9 F (36.6 C) (02/20 1130) Pulse Rate:  [69-106] 79 (02/20 1300) Resp:  [14-36] 20 (02/20 1300) BP: (114-159)/(57-82) 129/75 (02/20 1300) SpO2:  [84 %-100 %] 89 % (02/20 1300) Weight:  [81.9 kg] 81.9 kg (02/20 0424)  Physical Exam: Physical Exam Constitutional:      Appearance: He is well-developed.  HENT:     Head: Normocephalic and atraumatic.  Eyes:     Conjunctiva/sclera: Conjunctivae normal.  Cardiovascular:     Rate and Rhythm: Normal rate and regular rhythm.  Pulmonary:     Effort: Pulmonary effort is normal. No respiratory distress.     Breath sounds: No wheezing.  Abdominal:     General: There is no distension.     Palpations: Abdomen is soft.  Musculoskeletal:        General: Normal  range of motion.     Cervical back: Normal range of motion and neck supple.  Skin:    General: Skin is warm and dry.     Findings: No erythema or rash.  Neurological:     General: No focal deficit present.     Mental Status: He is alert and oriented to person, place, and time.  Psychiatric:        Mood and Affect: Mood normal.        Behavior: Behavior normal.        Thought Content: Thought content normal.        Judgment: Judgment normal.    Nephrostomy tube in place   CBC:    BMET Recent Labs    04/20/22 0307 04/21/22 0244  NA 137 137  K 3.9 3.7  CL 109 106  CO2 19* 21*  GLUCOSE 135* 110*  BUN 24* 21  CREATININE 1.83* 1.34*  CALCIUM 8.5* 8.5*     Liver Panel  Recent Labs    04/19/22 0112 04/20/22 0307 04/21/22 0244  PROT 7.0 6.2*  --   ALBUMIN 3.4* 3.3* 3.2*  AST 30 26   --   ALT 28 27  --   ALKPHOS 108 72  --   BILITOT 0.5 0.7  --        Sedimentation Rate No results for input(s): "ESRSEDRATE" in the last 72 hours. C-Reactive Protein No results for input(s): "CRP" in the last 72 hours.  Micro Results: Recent Results (from the past 720 hour(s))  Blood Culture (routine x 2)     Status: None (Preliminary result)   Collection Time: 04/19/22 12:55 AM   Specimen: BLOOD LEFT FOREARM  Result Value Ref Range Status   Specimen Description   Final    BLOOD LEFT FOREARM Performed at [facility], [address] [place], [name] [address]    Special Requests   Final    BOTTLES DRAWN AEROBIC AND ANAEROBIC Blood Culture adequate volume Performed at [facility], [address] [place], [name] [address]    Culture  Setup Time   Final    AEROBIC BOTTLE ONLY GRAM NEGATIVE RODS CRITICAL VALUE NOTED.  VALUE IS CONSISTENT WITH PREVIOUSLY REPORTED AND CALLED VALUE.    Culture   Final    GRAM NEGATIVE RODS IDENTIFICATION TO FOLLOW Performed at [facility], [place] [address], [place], [place] [address]    Report Status PENDING  Incomplete  Blood Culture (routine x 2)     Status: Abnormal   Collection Time: 04/19/22  1:00 AM   Specimen: Right Antecubital; Blood  Result Value Ref Range Status   Specimen Description   Final    RIGHT ANTECUBITAL BLOOD Performed at [facility], [place] [address] [place], [place] [address]    Special Requests   Final    BOTTLES DRAWN AEROBIC AND ANAEROBIC Blood Culture adequate volume Performed at [facility], [address] [place], [name] [address]    Culture  Setup Time   Final    GRAM NEGATIVE RODS IN BOTH AEROBIC AND ANAEROBIC BOTTLES CRITICAL RESULT CALLED TO, READ BACK BY AND VERIFIED WITH: L POINDEXTER,PHARMD@0243$  04/20/22 [place] Performed at [facility], [address], [place], [place] [address]    Culture KLEBSIELLA PNEUMONIAE (A)  Final   Report Status  04/21/2022 FINAL  Final   Organism ID, Bacteria KLEBSIELLA PNEUMONIAE  Final      Susceptibility  Klebsiella pneumoniae - MIC*    AMPICILLIN >=32 RESISTANT Resistant     CEFEPIME <=0.12 SENSITIVE Sensitive     CEFTAZIDIME <=1 SENSITIVE Sensitive     CEFTRIAXONE <=0.25 SENSITIVE Sensitive     CIPROFLOXACIN <=0.25 SENSITIVE Sensitive     GENTAMICIN <=1 SENSITIVE Sensitive     IMIPENEM <=0.25 SENSITIVE Sensitive     TRIMETH/SULFA <=20 SENSITIVE Sensitive     AMPICILLIN/SULBACTAM 4 SENSITIVE Sensitive     PIP/TAZO 8 SENSITIVE Sensitive     * KLEBSIELLA PNEUMONIAE  Blood Culture ID Panel (Reflexed)     Status: Abnormal   Collection Time: 04/19/22  1:00 AM  Result Value Ref Range Status   Enterococcus faecalis NOT DETECTED NOT DETECTED Final   Enterococcus Faecium NOT DETECTED NOT DETECTED Final   Listeria monocytogenes NOT DETECTED NOT DETECTED Final   Staphylococcus species NOT DETECTED NOT DETECTED Final   Staphylococcus aureus (BCID) NOT DETECTED NOT DETECTED Final   Staphylococcus epidermidis NOT DETECTED NOT DETECTED Final   Staphylococcus lugdunensis NOT DETECTED NOT DETECTED Final   Streptococcus species NOT DETECTED NOT DETECTED Final   Streptococcus agalactiae NOT DETECTED NOT DETECTED Final   Streptococcus pneumoniae NOT DETECTED NOT DETECTED Final   Streptococcus pyogenes NOT DETECTED NOT DETECTED Final   A.calcoaceticus-baumannii NOT DETECTED NOT DETECTED Final   Bacteroides fragilis NOT DETECTED NOT DETECTED Final   Enterobacterales DETECTED (A) NOT DETECTED Final    Comment: Enterobacterales represent a large order of gram negative bacteria, not a single organism. CRITICAL RESULT CALLED TO, READ BACK BY AND VERIFIED WITH: L POINDEXTER,PHARMD@0245$  04/20/22 [place]    Enterobacter cloacae complex NOT DETECTED NOT DETECTED Final   Escherichia coli NOT DETECTED NOT DETECTED Final   Klebsiella aerogenes NOT DETECTED NOT DETECTED Final   Klebsiella oxytoca NOT DETECTED NOT  DETECTED Final   Klebsiella pneumoniae DETECTED (A) NOT DETECTED Final    Comment: CRITICAL RESULT CALLED TO, READ BACK BY AND VERIFIED WITH: L POINDEXTER,PHARMD@0244$  04/20/22 [place]    Proteus species NOT DETECTED NOT DETECTED Final   Salmonella species NOT DETECTED NOT DETECTED Final   Serratia marcescens NOT DETECTED NOT DETECTED Final   Haemophilus influenzae NOT DETECTED NOT DETECTED Final   Neisseria meningitidis NOT DETECTED NOT DETECTED Final   Pseudomonas aeruginosa NOT DETECTED NOT DETECTED Final   Stenotrophomonas maltophilia NOT DETECTED NOT DETECTED Final   Candida albicans NOT DETECTED NOT DETECTED Final   Candida auris NOT DETECTED NOT DETECTED Final   Candida glabrata NOT DETECTED NOT DETECTED Final   Candida krusei NOT DETECTED NOT DETECTED Final   Candida parapsilosis NOT DETECTED NOT DETECTED Final   Candida tropicalis NOT DETECTED NOT DETECTED Final   Cryptococcus neoformans/gattii NOT DETECTED NOT DETECTED Final   CTX-M ESBL NOT DETECTED NOT DETECTED Final   Carbapenem resistance IMP NOT DETECTED NOT DETECTED Final   Carbapenem resistance KPC NOT DETECTED NOT DETECTED Final   Carbapenem resistance NDM NOT DETECTED NOT DETECTED Final   Carbapenem resist OXA 48 LIKE NOT DETECTED NOT DETECTED Final   Carbapenem resistance VIM NOT DETECTED NOT DETECTED Final    Comment: Performed at [facility], [address], [place], [place] [address]  Resp panel by RT-PCR (RSV, Flu A&B, Covid) Anterior Nasal Swab     Status: None   Collection Time: 04/19/22  1:13 AM   Specimen: Anterior Nasal Swab  Result Value Ref Range Status   SARS Coronavirus 2 by RT PCR NEGATIVE NEGATIVE Final    Comment: (NOTE) SARS-CoV-2 target nucleic  acids are NOT DETECTED.  The SARS-CoV-2 RNA is generally detectable in upper respiratory specimens during the acute phase of infection. The lowest concentration of SARS-CoV-2 viral copies this assay can detect is 138 copies/mL. A negative result does  not preclude SARS-Cov-2 infection and should not be used as the sole basis for treatment or other patient management decisions. A negative result may occur with  improper specimen collection/handling, submission of specimen other than nasopharyngeal swab, presence of viral mutation(s) within the areas targeted by this assay, and inadequate number of viral copies(<138 copies/mL). A negative result must be combined with clinical observations, patient history, and epidemiological information. The expected result is Negative.  Fact Sheet for Patients:  EntrepreneurPulse.com.au  Fact Sheet for Healthcare Providers:  IncredibleEmployment.be  This test is no t yet approved or cleared by the [place] FDA and  has been authorized for detection and/or diagnosis of SARS-CoV-2 by FDA under an Emergency Use Authorization (EUA). This EUA will remain  in effect (meaning this test can be used) for the duration of the COVID-19 declaration under Section 564(b)(1) of the Act, 21 U.S.C.section 360bbb-3(b)(1), unless the authorization is terminated  or revoked sooner.       Influenza A by PCR NEGATIVE NEGATIVE Final   Influenza B by PCR NEGATIVE NEGATIVE Final    Comment: (NOTE) The Xpert Xpress SARS-CoV-2/FLU/RSV plus assay is intended as an aid in the diagnosis of influenza from Nasopharyngeal swab specimens and should not be used as a sole basis for treatment. Nasal washings and aspirates are unacceptable for Xpert Xpress SARS-CoV-2/FLU/RSV testing.  Fact Sheet for Patients: EntrepreneurPulse.com.au  Fact Sheet for Healthcare Providers: IncredibleEmployment.be  This test is not yet approved or cleared by the [place] FDA and has been authorized for detection and/or diagnosis of SARS-CoV-2 by FDA under an Emergency Use Authorization (EUA). This EUA will remain in effect (meaning this test can be used) for the  duration of the COVID-19 declaration under Section 564(b)(1) of the Act, 21 U.S.C. section 360bbb-3(b)(1), unless the authorization is terminated or revoked.     Resp Syncytial Virus by PCR NEGATIVE NEGATIVE Final    Comment: (NOTE) Fact Sheet for Patients: EntrepreneurPulse.com.au  Fact Sheet for Healthcare Providers: IncredibleEmployment.be  This test is not yet approved or cleared by the [place] FDA and has been authorized for detection and/or diagnosis of SARS-CoV-2 by FDA under an Emergency Use Authorization (EUA). This EUA will remain in effect (meaning this test can be used) for the duration of the COVID-19 declaration under Section 564(b)(1) of the Act, 21 U.S.C. section 360bbb-3(b)(1), unless the authorization is terminated or revoked.  Performed at [facility], [address] [place], [name] [address]   Urine Culture (for pregnant, neutropenic or urologic patients or patients with an indwelling urinary catheter)     Status: Abnormal   Collection Time: 04/19/22  1:47 AM   Specimen: Urine, Clean Catch  Result Value Ref Range Status   Specimen Description   Final    URINE, CLEAN CATCH Performed at [facility], [address] [place], [place] [address]    Special Requests   Final    NONE Performed at [facility], [address] [place], [name] [address]    Culture >=100,000 COLONIES/mL KLEBSIELLA PNEUMONIAE (A)  Final   Report Status 04/21/2022 FINAL  Final   Organism ID, Bacteria KLEBSIELLA PNEUMONIAE (A)  Final      Susceptibility   Klebsiella pneumoniae -  MIC*    AMPICILLIN >=32 RESISTANT Resistant     CEFAZOLIN <=4 SENSITIVE Sensitive     CEFEPIME <=0.12 SENSITIVE Sensitive     CEFTRIAXONE <=0.25 SENSITIVE Sensitive     CIPROFLOXACIN <=0.25 SENSITIVE Sensitive     GENTAMICIN <=1 SENSITIVE Sensitive     IMIPENEM <=0.25 SENSITIVE Sensitive     NITROFURANTOIN 128 RESISTANT  Resistant     TRIMETH/SULFA <=20 SENSITIVE Sensitive     AMPICILLIN/SULBACTAM 4 SENSITIVE Sensitive     PIP/TAZO 8 SENSITIVE Sensitive     * >=100,000 COLONIES/mL KLEBSIELLA PNEUMONIAE  MRSA Next Gen by PCR, Nasal     Status: None   Collection Time: 04/19/22  1:26 PM   Specimen: Nasal Mucosa; Nasal Swab  Result Value Ref Range Status   MRSA by PCR Next Gen NOT DETECTED NOT DETECTED Final    Comment: (NOTE) The GeneXpert MRSA Assay (FDA approved for NASAL specimens only), is one component of a comprehensive MRSA colonization surveillance program. It is not intended to diagnose MRSA infection nor to guide or monitor treatment for MRSA infections. Test performance is not FDA approved in patients less than 51 years old. Performed at [facility], [place] [address] [place], [place] [address]     Studies/Results: No results found.    Assessment/Plan:  INTERVAL HISTORY: S are back on his Klebsiella pneumonia bacteremia   Principal Problem:   Sepsis associated hypotension (HCC) Active Problems:   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HCC)   History of bladder cancer   Chronic pain of both shoulders   Complicated UTI (urinary tract infection)   Severe sepsis with acute organ dysfunction (HCC)   Acute pyelonephritis   Acute respiratory failure with hypoxia (HCC)   Ureteral stone with hydronephrosis   ARF (acute renal failure) (HCC)   Anemia   Bacteremia due to Klebsiella pneumoniae   Acute nonintractable headache    Adrian Rios is a 82 y.o. 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 coli. Now admitted with Klesbiella PNA bacteremia from urinary source with stone found in distal right ureter with hydronephrosis and sp IR nephrostomy tube. Cystoscopy is planned at [facility] in several weeks   #1  Klebsiella  pneumonia bacteremia:  His isolate is actually quite sensitive to antibiotics.  I would recommend continue ceftriaxone for few more days but then transitioning him over to oral levofloxacin and continuing this through his procedure at [facility].   I spent 52 minutes with the patient including than 50% of the time in face to face counseling of the patient regarding his Klebsiella pneumonia bacteremia complicated by stone and hydronephrosis right nephrostomy tube  along with review of medical records in preparation for the visit and during the visit and in coordination of his care.   I will sign off for now.  Please call with further questions. Dr. Johnnye Sima ill be on for [facility] Wed thru Friday and Dr. Candiss Norse on the weekend.    LOS: 2 days   Alcide Evener 04/21/2022, 3:12 PM

## 2022-04-21 NOTE — Progress Notes (Addendum)
PROGRESS NOTE    Adrian Rios  [id number] DOB: September 02, 1940 DOA: 04/19/2022 PCP: [name], MD    Chief Complaint  Patient presents with   Flank Pain    Brief Narrative:  Patient 82 year old gentleman history of bladder cancer status post ileal conduit, chronic bronchitis unknown etiology, history of ESBL, depression presented to the ED with fever right flank pain with temperature as high as 103.  Patient seen in the ED admitted early on this morning and on presentation noted to be tachycardic with heart rates in the 130s, hypotensive with maps in the 60s, placed on volume resuscitation.  Noted to be in acute renal failure, noted to have a leukocytosis.  CT abdomen and pelvis done with a 5 mm stone in the distal right ureter with hydronephrosis.  Urinalysis done concerning for UTI.  Patient pancultured.  Patient placed empirically on IV meropenem.  Urology consulted.  Urology recommended percutaneous nephrostomy tube placement by IR which was done 04/19/2022.  Blood cultures now consistent with a Klebsiella pneumonia bacteremia.  ID consulted.   Assessment & Plan:   Principal Problem:   Sepsis associated hypotension (HCC) Active Problems:   Acute pyelonephritis   Acute respiratory failure with hypoxia (HCC)   History of bladder cancer   Complicated UTI (urinary tract infection)   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HCC)   Chronic pain of both shoulders   Severe sepsis with acute organ dysfunction (HCC)   Ureteral stone with hydronephrosis   ARF (acute renal failure) (HCC)   Anemia   Bacteremia due to Klebsiella pneumoniae   Acute nonintractable headache  #1 severe sepsis secondary to acute pyelonephritis/probable ESBL UTI with some associated hypotension, klebsiella paneumoniae bacteremia POA -Patient presented with right flank pain, fever temp of 103, CBC done with a leukocytosis white count as high as 13.1 this morning.  Patient noted to be tachycardic on presentation with heart rate in the  130s.   -Urinalysis done with moderate leukocytes, positive nitrite, many bacteria, 11-20 WBCs. -Blood cultures positive for Klebsiella pneumonia, urine cultures positive for Klebsiella pneumoniae. -Patient initially given aggressive fluid resuscitation due to hypotension with improvement with blood pressure however patient developed acute respiratory failure with hypoxia and as such IV fluids have been discontinued and patient received a dose of Lasix. -Systolic blood pressure was in the low 100s however improved with gentle hydration.  -2/4 blood cultures positive for Klebsiella pneumonie. -BP with some improvement status post IV albumin every 6 hours x 1 day. -Patient was seen in consultation by urology who recommended percutaneous nephrostomy tube placement by IR which was done on 04/19/2022 without any complications. -Due to complex urinary history, right ureteral stone with hydronephrosis, prior history of ESBL UTI, now with a bacteremia which likely seeded from urine ID consulted, recommending repeat blood cultures which will be drawn today.   -IV Merrem has been narrowed down to IV Rocephin per ID recommendations.   -Urology following and recommending that patient will need antegrade ureteroscopy in 2 weeks with his primary urologist at [facility]  -Urology, ID following and appreciate input and recommendations.  2.  Acute hypoxic respiratory failure secondary to hypovolemia in the setting of chronic bronchitis, POA -Noted in the ED to have increased O2 requirements as high as 8 L O2 after volume resuscitation per admitting physician patient looked hypervolemic on examination. -IV fluids discontinued patient given a dose of Lasix 20 mg IV x 1. -Patient also started on Pulmicort and Brovana nebs as well as DuoNebs. -Currently with  sats of 95% on room air.  -Continue empiric IV antibiotics. -Monitor closely with gentle hydration.  3.  GERD -PPI.  4.  Depression -Effexor.   5.  Acute renal  failure -Likely multifactorial secondary to postrenal azotemia secondary to ureteral stone with hydronephrosis in the setting of dehydration as patient noted to be hypotensive on presentation. -Urinalysis with moderate leukocytes, positive nitrites, many bacteria, negative for protein, negative for glucose, WBC 11-20. -Patient aggressively hydrated with IV fluid however went to acute hypoxic respiratory failure and as such IV fluids discontinued and patient received a dose of Lasix. -Urology assessed patient in consultation and recommended percutaneous nephrostomy tube placement which was done per IR on 04/19/2022.   -Urine output of approximately 2 L over the past 24 hours.  -Creatinine trending back down and creatinine currently at 1.34 today post percutaneous nephrostomy placement and hydration. -Decrease IV fluids to 50 cc an hour for the next 24 hours. -Monitor urine output and renal function.  6.  Right ureteral stone with hydronephrosis -Patient presented right flank pain, fever chills, CT renal stone protocol with right ureteral stone with hydronephrosis. -Patient seen in consultation by urology who recommended percutaneous nephrostomy tube placement per IR which was done 04/19/2022.  -Urine cultures with > 100,000 colonies of Klebsiella pneumoniae. -Was on IV Merrem and has been transitioned to IV Rocephin per ID recommendations. -Per urology patient will require antegrade ureteroscopy after 2 weeks. -Outpatient follow-up with primary urologist at [facility].   7.  Anemia -Likely dilutional. -Patient denies any overt bleeding. -Anemia panel consistent with anemia of chronic disease, iron of 19, TIBC of 174, ferritin 156, folate 15.3, vitamin B12 415.  -Hemoglobin stable at 11.1. -Transfusion threshold hemoglobin < 8  8.  History of bladder cancer status post ileal conduit -Outpatient follow-up with urology. -Urology following.  9.  Headache -Improved on IV Reglan and as needed  Dilaudid.  10.  Constipation -Patient placed on MiraLAX daily as well as Senokot-S twice daily however no bowel movement with that regimen. -Increase MiraLAX to twice daily. -Sorbitol every 3 hours x 2 doses. -If no results may consider tapwater enema.   DVT prophylaxis: Lovenox Code Status: Full Family Communication: Updated patient and wife at bedside. Disposition: Transfer to telemetry.  Likely home when clinically improved, cleared by urology and ID and transition to oral antibiotics hopefully in the next 24 to 48 hours.   Status is: Inpatient Remains inpatient appropriate because: Severity of illness   Consultants:  Urology: Dr. Alyson Ingles 04/19/2022 Interventional radiology: Dr. Denna Haggard 04/19/2022 Infectious disease: Dr.Van Dam 04/20/2022  Procedures: CT renal stone protocol 04/19/2022 Chest x-ray 04/19/2022 PCN placement right side 10 French tube bag drain per IR 04/19/2022  Antimicrobials: IV cefepime 04/19/2022 x 1 IV Merrem 04/19/2022>>>> 04/20/2022 IV vancomycin 04/19/2022>>>>> 04/20/2022 IV Rocephin 04/21/2022>>>>   Subjective: Patient laying in bed.  Overall feeling better.  Stated had a cough and wheezing overnight currently feeling better.  Complains of constipation no bowel movement after MiraLAX and Senokot-S yesterday.  No significant abdominal pain.  Tolerating current diet.  Wife at bedside.    Objective: Vitals:   04/21/22 0800 04/21/22 0830 04/21/22 0835 04/21/22 0900  BP:    124/61  Pulse: 94 93  80  Resp: (!) 36 (!) 23  (!) 23  Temp:      TempSrc:      SpO2: 91% 93% 94% 91%  Weight:      Height:        Intake/Output Summary (Last 24  hours) at 04/21/2022 0934 Last data filed at 04/21/2022 0552 Gross per 24 hour  Intake 2422.15 ml  Output 2525 ml  Net -102.85 ml    Filed Weights   04/19/22 1337 04/20/22 2304 04/21/22 0424  Weight: 81.6 kg 81.9 kg 81.9 kg    Examination:  General exam: NAD. Respiratory system: Lungs clear to auscultation  bilaterally.  No wheezes, no crackles, no rhonchi.  Fair air movement.  Speaking in full sentences.  Cardiovascular system: Regular rate and rhythm no murmurs rubs or gallops.  No JVD.  No lower extremity edema. Gastrointestinal system: Abdomen is soft, nontender, nondistended, positive bowel sounds.  Percutaneous nephrostomy tube noted on the right intact.  Ileostomy with clear urine.  Nephrostomy bag with clear urine.  Central nervous system: Alert and oriented.  Moving extremities spontaneously.  No focal neurological deficits.   Extremities: Symmetric 5 x 5 power. Skin: No rashes, lesions or ulcers Psychiatry: Judgement and insight appear normal. Mood & affect appropriate.     Data Reviewed: I have personally reviewed following labs and imaging studies  CBC: Recent Labs  Lab 04/19/22 0112 04/19/22 0535 04/20/22 0307 04/21/22 0244  WBC 11.2* 13.1* 13.5* 12.0*  NEUTROABS 9.6*  --  11.2* 8.9*  HGB 13.0 9.0* 11.0* 11.1*  HCT 39.1 28.4* 34.0* 35.0*  MCV 92.7 99.0 97.4 95.6  PLT 280 178 225 225     Basic Metabolic Panel: Recent Labs  Lab 04/19/22 0112 04/19/22 0535 04/20/22 0307 04/21/22 0244  NA 135  --  137 137  K 3.9  --  3.9 3.7  CL 110  --  109 106  CO2 20*  --  19* 21*  GLUCOSE 151*  --  135* 110*  BUN 23  --  24* 21  CREATININE 1.88* 1.60* 1.83* 1.34*  CALCIUM 8.5*  --  8.5* 8.5*  MG  --   --  2.0  --   PHOS  --   --   --  2.5     GFR: Estimated Creatinine Clearance: 44.3 mL/min (A) (by C-G formula based on SCr of 1.34 mg/dL (H)).  Liver Function Tests: Recent Labs  Lab 04/19/22 0112 04/20/22 0307 04/21/22 0244  AST 30 26  --   ALT 28 27  --   ALKPHOS 108 72  --   BILITOT 0.5 0.7  --   PROT 7.0 6.2*  --   ALBUMIN 3.4* 3.3* 3.2*     CBG: No results for input(s): "GLUCAP" in the last 168 hours.   Recent Results (from the past 240 hour(s))  Blood Culture (routine x 2)     Status: None (Preliminary result)   Collection Time: 04/19/22 12:55 AM    Specimen: BLOOD LEFT FOREARM  Result Value Ref Range Status   Specimen Description   Final    BLOOD LEFT FOREARM Performed at [facility], [address] [place], [name] [address]    Special Requests   Final    BOTTLES DRAWN AEROBIC AND ANAEROBIC Blood Culture adequate volume Performed at [facility], [address] [place], [name] [address]    Culture  Setup Time   Final    AEROBIC BOTTLE ONLY GRAM NEGATIVE RODS CRITICAL VALUE NOTED.  VALUE IS CONSISTENT WITH PREVIOUSLY REPORTED AND CALLED VALUE.    Culture   Final    GRAM NEGATIVE RODS IDENTIFICATION TO FOLLOW Performed at [facility], [place] [address], [place], [place] [address]    Report  Status PENDING  Incomplete  Blood Culture (routine x 2)     Status: Abnormal   Collection Time: 04/19/22  1:00 AM   Specimen: Right Antecubital; Blood  Result Value Ref Range Status   Specimen Description   Final    RIGHT ANTECUBITAL BLOOD Performed at [facility], [place] [address] [place], [place] [address]    Special Requests   Final    BOTTLES DRAWN AEROBIC AND ANAEROBIC Blood Culture adequate volume Performed at [facility], [address] [place], [name] [address]    Culture  Setup Time   Final    GRAM NEGATIVE RODS IN BOTH AEROBIC AND ANAEROBIC BOTTLES CRITICAL RESULT CALLED TO, READ BACK BY AND VERIFIED WITH: L POINDEXTER,PHARMD@0243$  04/20/22 [place] Performed at [facility], [address], [place], [place] [address]    Culture KLEBSIELLA PNEUMONIAE (A)  Final   Report Status 04/21/2022 FINAL  Final   Organism ID, Bacteria KLEBSIELLA PNEUMONIAE  Final      Susceptibility   Klebsiella pneumoniae - MIC*    AMPICILLIN >=32 RESISTANT Resistant     CEFEPIME <=0.12 SENSITIVE Sensitive     CEFTAZIDIME <=1 SENSITIVE Sensitive     CEFTRIAXONE <=0.25 SENSITIVE Sensitive     CIPROFLOXACIN <=0.25 SENSITIVE Sensitive     GENTAMICIN <=1 SENSITIVE Sensitive     IMIPENEM <=0.25  SENSITIVE Sensitive     TRIMETH/SULFA <=20 SENSITIVE Sensitive     AMPICILLIN/SULBACTAM 4 SENSITIVE Sensitive     PIP/TAZO 8 SENSITIVE Sensitive     * KLEBSIELLA PNEUMONIAE  Blood Culture ID Panel (Reflexed)     Status: Abnormal   Collection Time: 04/19/22  1:00 AM  Result Value Ref Range Status   Enterococcus faecalis NOT DETECTED NOT DETECTED Final   Enterococcus Faecium NOT DETECTED NOT DETECTED Final   Listeria monocytogenes NOT DETECTED NOT DETECTED Final   Staphylococcus species NOT DETECTED NOT DETECTED Final   Staphylococcus aureus (BCID) NOT DETECTED NOT DETECTED Final   Staphylococcus epidermidis NOT DETECTED NOT DETECTED Final   Staphylococcus lugdunensis NOT DETECTED NOT DETECTED Final   Streptococcus species NOT DETECTED NOT DETECTED Final   Streptococcus agalactiae NOT DETECTED NOT DETECTED Final   Streptococcus pneumoniae NOT DETECTED NOT DETECTED Final   Streptococcus pyogenes NOT DETECTED NOT DETECTED Final   A.calcoaceticus-baumannii NOT DETECTED NOT DETECTED Final   Bacteroides fragilis NOT DETECTED NOT DETECTED Final   Enterobacterales DETECTED (A) NOT DETECTED Final    Comment: Enterobacterales represent a large order of gram negative bacteria, not a single organism. CRITICAL RESULT CALLED TO, READ BACK BY AND VERIFIED WITH: L POINDEXTER,PHARMD@0245$  04/20/22 [place]    Enterobacter cloacae complex NOT DETECTED NOT DETECTED Final   Escherichia coli NOT DETECTED NOT DETECTED Final   Klebsiella aerogenes NOT DETECTED NOT DETECTED Final   Klebsiella oxytoca NOT DETECTED NOT DETECTED Final   Klebsiella pneumoniae DETECTED (A) NOT DETECTED Final    Comment: CRITICAL RESULT CALLED TO, READ BACK BY AND VERIFIED WITH: L POINDEXTER,PHARMD@0244$  04/20/22 [place]    Proteus species NOT DETECTED NOT DETECTED Final   Salmonella species NOT DETECTED NOT DETECTED Final   Serratia marcescens NOT DETECTED NOT DETECTED Final   Haemophilus influenzae NOT DETECTED NOT DETECTED Final    Neisseria meningitidis NOT DETECTED NOT DETECTED Final   Pseudomonas aeruginosa NOT DETECTED NOT DETECTED Final   Stenotrophomonas maltophilia NOT DETECTED NOT DETECTED Final   Candida albicans NOT DETECTED NOT DETECTED Final   Candida auris NOT DETECTED NOT DETECTED Final  Candida glabrata NOT DETECTED NOT DETECTED Final   Candida krusei NOT DETECTED NOT DETECTED Final   Candida parapsilosis NOT DETECTED NOT DETECTED Final   Candida tropicalis NOT DETECTED NOT DETECTED Final   Cryptococcus neoformans/gattii NOT DETECTED NOT DETECTED Final   CTX-M ESBL NOT DETECTED NOT DETECTED Final   Carbapenem resistance IMP NOT DETECTED NOT DETECTED Final   Carbapenem resistance KPC NOT DETECTED NOT DETECTED Final   Carbapenem resistance NDM NOT DETECTED NOT DETECTED Final   Carbapenem resist OXA 48 LIKE NOT DETECTED NOT DETECTED Final   Carbapenem resistance VIM NOT DETECTED NOT DETECTED Final    Comment: Performed at [facility], [place] [address], [place], [place] [address]  Resp panel by RT-PCR (RSV, Flu A&B, Covid) Anterior Nasal Swab     Status: None   Collection Time: 04/19/22  1:13 AM   Specimen: Anterior Nasal Swab  Result Value Ref Range Status   SARS Coronavirus 2 by RT PCR NEGATIVE NEGATIVE Final    Comment: (NOTE) SARS-CoV-2 target nucleic acids are NOT DETECTED.  The SARS-CoV-2 RNA is generally detectable in upper respiratory specimens during the acute phase of infection. The lowest concentration of SARS-CoV-2 viral copies this assay can detect is 138 copies/mL. A negative result does not preclude SARS-Cov-2 infection and should not be used as the sole basis for treatment or other patient management decisions. A negative result may occur with  improper specimen collection/handling, submission of specimen other than nasopharyngeal swab, presence of viral mutation(s) within the areas targeted by this assay, and inadequate number of viral copies(<138 copies/mL). A negative result  must be combined with clinical observations, patient history, and epidemiological information. The expected result is Negative.  Fact Sheet for Patients:  EntrepreneurPulse.com.au  Fact Sheet for Healthcare Providers:  IncredibleEmployment.be  This test is no t yet approved or cleared by the [place] FDA and  has been authorized for detection and/or diagnosis of SARS-CoV-2 by FDA under an Emergency Use Authorization (EUA). This EUA will remain  in effect (meaning this test can be used) for the duration of the COVID-19 declaration under Section 564(b)(1) of the Act, 21 U.S.C.section 360bbb-3(b)(1), unless the authorization is terminated  or revoked sooner.       Influenza A by PCR NEGATIVE NEGATIVE Final   Influenza B by PCR NEGATIVE NEGATIVE Final    Comment: (NOTE) The Xpert Xpress SARS-CoV-2/FLU/RSV plus assay is intended as an aid in the diagnosis of influenza from Nasopharyngeal swab specimens and should not be used as a sole basis for treatment. Nasal washings and aspirates are unacceptable for Xpert Xpress SARS-CoV-2/FLU/RSV testing.  Fact Sheet for Patients: EntrepreneurPulse.com.au  Fact Sheet for Healthcare Providers: IncredibleEmployment.be  This test is not yet approved or cleared by the [place] FDA and has been authorized for detection and/or diagnosis of SARS-CoV-2 by FDA under an Emergency Use Authorization (EUA). This EUA will remain in effect (meaning this test can be used) for the duration of the COVID-19 declaration under Section 564(b)(1) of the Act, 21 U.S.C. section 360bbb-3(b)(1), unless the authorization is terminated or revoked.     Resp Syncytial Virus by PCR NEGATIVE NEGATIVE Final    Comment: (NOTE) Fact Sheet for Patients: EntrepreneurPulse.com.au  Fact Sheet for Healthcare Providers: IncredibleEmployment.be  This test is  not yet approved or cleared by the [place] FDA and has been authorized for detection and/or diagnosis of SARS-CoV-2 by FDA under an Emergency Use Authorization (EUA). This EUA will remain in effect (meaning this test  can be used) for the duration of the COVID-19 declaration under Section 564(b)(1) of the Act, 21 U.S.C. section 360bbb-3(b)(1), unless the authorization is terminated or revoked.  Performed at [facility], [address] [place], [name] [address]   Urine Culture (for pregnant, neutropenic or urologic patients or patients with an indwelling urinary catheter)     Status: Abnormal   Collection Time: 04/19/22  1:47 AM   Specimen: Urine, Clean Catch  Result Value Ref Range Status   Specimen Description   Final    URINE, CLEAN CATCH Performed at [facility], [address] [place], [place] [address]    Special Requests   Final    NONE Performed at [facility], [address] [place], [name] [address]    Culture >=100,000 COLONIES/mL KLEBSIELLA PNEUMONIAE (A)  Final   Report Status 04/21/2022 FINAL  Final   Organism ID, Bacteria KLEBSIELLA PNEUMONIAE (A)  Final      Susceptibility   Klebsiella pneumoniae - MIC*    AMPICILLIN >=32 RESISTANT Resistant     CEFAZOLIN <=4 SENSITIVE Sensitive     CEFEPIME <=0.12 SENSITIVE Sensitive     CEFTRIAXONE <=0.25 SENSITIVE Sensitive     CIPROFLOXACIN <=0.25 SENSITIVE Sensitive     GENTAMICIN <=1 SENSITIVE Sensitive     IMIPENEM <=0.25 SENSITIVE Sensitive     NITROFURANTOIN 128 RESISTANT Resistant     TRIMETH/SULFA <=20 SENSITIVE Sensitive     AMPICILLIN/SULBACTAM 4 SENSITIVE Sensitive     PIP/TAZO 8 SENSITIVE Sensitive     * >=100,000 COLONIES/mL KLEBSIELLA PNEUMONIAE  MRSA Next Gen by PCR, Nasal     Status: None   Collection Time: 04/19/22  1:26 PM   Specimen: Nasal Mucosa; Nasal Swab  Result Value Ref Range Status   MRSA by PCR Next Gen NOT DETECTED NOT DETECTED Final    Comment:  (NOTE) The GeneXpert MRSA Assay (FDA approved for NASAL specimens only), is one component of a comprehensive MRSA colonization surveillance program. It is not intended to diagnose MRSA infection nor to guide or monitor treatment for MRSA infections. Test performance is not FDA approved in patients less than 56 years old. Performed at [facility], [place] [address] [place], [place] [address]          Radiology Studies: IR NEPHROSTOMY PLACEMENT RIGHT  Result Date: 04/19/2022 INDICATION: Obstructive right ureteral stone, history of ileal conduit with revision EXAM: Placement of right percutaneous nephrostomy tube using ultrasound and fluoroscopic guidance COMPARISON:  None Available. MEDICATIONS: Documented in the EMR ANESTHESIA/SEDATION: Moderate (conscious) sedation was employed during this procedure. A total of versed 0.5 mg and Fentanyl 25 mcg was administered intravenously. Moderate Sedation Time: 18 minutes. The patient's level of consciousness and vital signs were monitored continuously by radiology nursing throughout the procedure under my direct supervision. CONTRAST:  7.5 mL Gadavist-administered into the collecting system(s); patient was premedicated with 50 mg Benadryl IV FLUOROSCOPY TIME:  Fluoroscopy Time: 1.6 minutes (10 mGy) COMPLICATIONS: None immediate. PROCEDURE: Informed written consent was obtained from the patient after a thorough discussion of the procedural risks, benefits and alternatives. All questions were addressed. Maximal Sterile Barrier Technique was utilized including caps, mask, sterile gowns, sterile gloves, sterile drape, hand hygiene and skin antiseptic. A timeout was performed prior to the initiation of the procedure. The patient was placed prone on the exam table. The right flank was prepped and draped in the standard sterile fashion. Ultrasound was used to evaluate the  right kidney, which demonstrated hydronephrosis. Skin entry site was marked,  and local analgesia was obtained with 1% lidocaine. Using ultrasound guidance, an appropriate lower pole posterior calyx was punctured using a 21-gauge Chiba needle. Entry into the collecting system was confirmed with return of urine, and gentle injection of contrast material under fluoroscopy opacifying the proximal collecting system. An 018 wire was advanced through the needle into the renal pelvis and proximal ureter, followed by placement of a transition dilator. An antegrade nephrostogram was then performed, which demonstrated hydronephrosis. Over an 035 Amplatz wire, the percutaneous tract was serially dilated, and a 10.2 French nephrostomy tube was advanced with the loop formed in the renal pelvis. Appropriate positioning of the nephrostomy tube was confirmed with injection of contrast material. The nephrostomy tube was secured to skin using silk suture and a dressing. It was attached to bag drainage. The patient tolerated the procedure well without immediate complication. IMPRESSION: Successful placement of a right-sided 10 French percutaneous nephrostomy tube. Nephrostomy tube placed to bag drainage. Electronically Signed   By: Albin Felling M.D.   On: 04/19/2022 13:06        Scheduled Meds:  arformoterol  15 mcg Nebulization BID   budesonide (PULMICORT) nebulizer solution  0.25 mg Nebulization BID   Chlorhexidine Gluconate Cloth  6 each Topical Daily   enoxaparin (LOVENOX) injection  40 mg Subcutaneous Q24H   latanoprost  1 drop Right Eye QHS   magnesium oxide  400 mg Oral Q supper   montelukast  10 mg Oral QHS   pantoprazole  40 mg Oral Daily   polyethylene glycol  17 g Oral Daily   senna-docusate  1 tablet Oral BID   venlafaxine  112.5 mg Oral Daily   Continuous Infusions:  sodium chloride Stopped (04/20/22 1026)   sodium chloride 100 mL/hr at 04/21/22 0552   cefTRIAXone (ROCEPHIN)  IV       LOS: 2 days    Time spent: 40 minutes    Irine Seal, MD [facility]   To contact the attending provider between 7A-7P or the covering provider during after hours 7P-7A, please log into the web site [email or web address] and access using universal [place] password for that web site. If you do not have the password, please call the hospital operator.  04/21/2022, 9:34 AM

## 2022-04-22 DIAGNOSIS — N179 Acute kidney failure, unspecified: Secondary | ICD-10-CM | POA: Diagnosis not present

## 2022-04-22 DIAGNOSIS — A419 Sepsis, unspecified organism: Secondary | ICD-10-CM | POA: Diagnosis not present

## 2022-04-22 DIAGNOSIS — N390 Urinary tract infection, site not specified: Secondary | ICD-10-CM | POA: Diagnosis not present

## 2022-04-22 DIAGNOSIS — N10 Acute tubulo-interstitial nephritis: Secondary | ICD-10-CM | POA: Diagnosis not present

## 2022-04-22 LAB — RENAL FUNCTION PANEL
Albumin: 3.1 g/dL — ABNORMAL LOW (ref 3.5–5.0)
Anion gap: 8 (ref 5–15)
BUN: 14 mg/dL (ref 8–23)
CO2: 23 mmol/L (ref 22–32)
Calcium: 8.5 mg/dL — ABNORMAL LOW (ref 8.9–10.3)
Chloride: 107 mmol/L (ref 98–111)
Creatinine, Ser: 1.06 mg/dL (ref 0.61–1.24)
GFR, Estimated: >60 mL/min (ref >60)
Glucose, Bld: 110 mg/dL — ABNORMAL HIGH (ref 70–99)
Phosphorus: 2.5 mg/dL (ref 2.5–4.6)
Potassium: 3.6 mmol/L (ref 3.5–5.1)
Sodium: 138 mmol/L (ref 135–145)

## 2022-04-22 LAB — CBC WITH DIFFERENTIAL/PLATELET
Abs Immature Granulocytes: 0.03 10*3/uL (ref 0.00–0.07)
Basophils Absolute: 0 10*3/uL (ref 0.0–0.1)
Basophils Relative: 0 %
Eosinophils Absolute: 0.4 10*3/uL (ref 0.0–0.5)
Eosinophils Relative: 4 %
HCT: 33.8 % — ABNORMAL LOW (ref 39.0–52.0)
Hemoglobin: 11.1 g/dL — ABNORMAL LOW (ref 13.0–17.0)
Immature Granulocytes: 0 %
Lymphocytes Relative: 18 %
Lymphs Abs: 1.8 10*3/uL (ref 0.7–4.0)
MCH: 31 pg (ref 26.0–34.0)
MCHC: 32.8 g/dL (ref 30.0–36.0)
MCV: 94.4 fL (ref 80.0–100.0)
Monocytes Absolute: 0.8 10*3/uL (ref 0.1–1.0)
Monocytes Relative: 8 %
Neutro Abs: 6.9 10*3/uL (ref 1.7–7.7)
Neutrophils Relative %: 70 %
Platelets: 246 10*3/uL (ref 150–400)
RBC: 3.58 MIL/uL — ABNORMAL LOW (ref 4.22–5.81)
RDW: 12.7 % (ref 11.5–15.5)
WBC: 10 10*3/uL (ref 4.0–10.5)
nRBC: 0 % (ref 0.0–0.2)

## 2022-04-22 LAB — CULTURE, BLOOD (ROUTINE X 2): Special Requests: ADEQUATE

## 2022-04-22 MED ORDER — LEVOFLOXACIN 500 MG PO TABS: 750.0000 mg | ORAL_TABLET | Freq: Every day | ORAL | Status: DC

## 2022-04-22 MED ORDER — LEVOFLOXACIN 750 MG PO TABS: 750.0000 mg | ORAL_TABLET | Freq: Every day | ORAL | 0 refills | Status: AC

## 2022-04-22 MED ORDER — BENZONATATE 100 MG PO CAPS: 100.0000 mg | ORAL_CAPSULE | Freq: Three times a day (TID) | ORAL | 0 refills | Status: DC | PRN

## 2022-04-22 NOTE — Evaluation (Signed)
Physical Therapy Evaluation Patient Details Name: Adrian Rios MRN: [id number] DOB: [birth year] Today's Date: 04/22/2022  History of Present Illness  Patient 82 year old male with history of bladder cancer status post ileal conduit, chronic bronchitis unknown etiology, history of ESBL, depression presented to the ED with fever right flank pain with temperature. Urology recommended percutaneous nephrostomy tube placement by IR which was done 04/19/2022.  Blood cultures now consistent with a Klebsiella pneumonia bacteremia.   Clinical Impression  Pt ia an 82 y.o. male with above listed HPI. Pt is supervision-modified independent for all mobility. Pt will have assist from spouse as needed at home. Pt, family, ad RN encouraged for pt to continue mobilizing with staff and family during stay. No further skilled PT needs identified. PT will sign off.      Recommendations for follow up therapy are one component of a multi-disciplinary discharge planning process, led by the attending physician.  Recommendations may be updated based on patient status, additional functional criteria and insurance authorization.  Follow Up Recommendations No PT follow up      Assistance Recommended at Discharge PRN  Patient can return home with the following       Equipment Recommendations None recommended by PT  Recommendations for Other Services       Functional Status Assessment Patient has had a recent decline in their functional status and demonstrates the ability to make significant improvements in function in a reasonable and predictable amount of time.     Precautions / Restrictions Precautions Precautions: None Restrictions Weight Bearing Restrictions: No      Mobility  Bed Mobility               General bed mobility comments: pt in recliner pre/post session    Transfers Overall transfer level: Modified independent Equipment used: None                     Ambulation/Gait Ambulation/Gait assistance: Supervision, Modified independent (Device/Increase time) Gait Distance (Feet): 300 Feet Assistive device: None Gait Pattern/deviations: Step-through pattern Gait velocity: WNL     General Gait Details: no overt LOB , no gait deviations observed with head turns. Pt denied any dizziness during session. x2 episodes of mild drifiting, pt corrected.  Supervision progressed to MOD I.  Stairs            Wheelchair Mobility    Modified Rankin (Stroke Patients Only)       Balance Overall balance assessment: Mild deficits observed, not formally tested                                           Pertinent Vitals/Pain Pain Assessment Pain Assessment: No/denies pain    Home Living Family/patient expects to be discharged to:: Private residence Living Arrangements: Spouse/significant other Available Help at Discharge: Family Type of Home: House Home Access: Ramped entrance       Home Layout: Two level;Able to live on main level with bedroom/bathroom Home Equipment: Grab bars - tub/shower Additional Comments: likes to fish. Son lives nearby    Prior Function Prior Level of Function : Independent/Modified Independent;Driving             Mobility Comments: denies history of falls       Hand Dominance   Dominant Hand: Right    Extremity/Trunk Assessment   Upper Extremity Assessment Upper Extremity Assessment: Overall WFL for  tasks assessed    Lower Extremity Assessment Lower Extremity Assessment: Overall WFL for tasks assessed    Cervical / Trunk Assessment Cervical / Trunk Assessment: Normal  Communication   Communication: No difficulties  Cognition Arousal/Alertness: Awake/alert Behavior During Therapy: WFL for tasks assessed/performed Overall Cognitive Status: Within Functional Limits for tasks assessed                                          General Comments General  comments (skin integrity, edema, etc.): vitals beginning of session in recliner: 112/64, 80bpm  standing: 99/65, 92bpm, 94%, after ambulation: 112/75, 84bpm, 97% on RA after ambulation    Exercises     Assessment/Plan    PT Assessment Patient does not need any further PT services  PT Problem List         PT Treatment Interventions      PT Goals (Current goals can be found in the Care Plan section)  Acute Rehab PT Goals Patient Stated Goal: Go home today, improve breathing PT Goal Formulation: With patient/family Time For Goal Achievement: 05/06/22 Potential to Achieve Goals: Good    Frequency       Co-evaluation               AM-PAC PT "6 Clicks" Mobility  Outcome Measure Help needed turning from your back to your side while in a flat bed without using bedrails?: None Help needed moving from lying on your back to sitting on the side of a flat bed without using bedrails?: None Help needed moving to and from a bed to a chair (including a wheelchair)?: None Help needed standing up from a chair using your arms (e.g., wheelchair or bedside chair)?: None Help needed to walk in hospital room?: None Help needed climbing 3-5 steps with a railing? : A Little 6 Click Score: 23    End of Session Equipment Utilized During Treatment: Gait belt Activity Tolerance: Patient tolerated treatment well Patient left: in chair;with call bell/phone within reach;with family/visitor present Nurse Communication: Mobility status PT Visit Diagnosis: Unsteadiness on feet (R26.81)    Time: [id number] PT Time Calculation (min) (ACUTE ONLY): 25 min   Charges:   PT Evaluation $PT Eval Low Complexity: 1 Low PT Treatments $Therapeutic Activity: 8-22 mins        Festus Barren PT, DPT  Acute Rehabilitation Services  Office [phone] 04/22/2022, 1:08 PM

## 2022-04-22 NOTE — Evaluation (Signed)
Occupational Therapy Evaluation Patient Details Name: Adrian Rios MRN: [id number] DOB: 1940/05/04 Today's Date: 04/22/2022   History of Present Illness Patient 82 year old gentleman history of bladder cancer status post ileal conduit, chronic bronchitis unknown etiology, history of ESBL, depression presented to the ED with fever right flank pain with temperature. rology recommended percutaneous nephrostomy tube placement by IR which was done 04/19/2022.  Blood cultures now consistent with a Klebsiella pneumonia bacteremia.   Clinical Impression   Adrian Rios is an 82 year old man who presents at his baseline. He reports a mostly sedentary life but he is mostly independent with his ADLS. His wife manages his urine bag. He demonstrates ability to don socks and perform toileting and ambulated quite well in room. Patient has his wife to assist him if needed. No OT needs at this time.       Recommendations for follow up therapy are one component of a multi-disciplinary discharge planning process, led by the attending physician.  Recommendations may be updated based on patient status, additional functional criteria and insurance authorization.   Follow Up Recommendations  No OT follow up     Assistance Recommended at Discharge PRN  Patient can return home with the following Assistance with cooking/housework    Functional Status Assessment  Patient has not had a recent decline in their functional status  Equipment Recommendations  None recommended by OT    Recommendations for Other Services       Precautions / Restrictions Precautions Precautions: None Restrictions Weight Bearing Restrictions: No      Mobility Bed Mobility               General bed mobility comments: up in recliner    Transfers Overall transfer level: Modified independent Equipment used: None                      Balance Overall balance assessment: No apparent balance deficits (not formally  assessed)                                         ADL either performed or assessed with clinical judgement   ADL Overall ADL's : At baseline                                             Vision Baseline Vision/History: 1 Wears glasses       Perception     Praxis      Pertinent Vitals/Pain Pain Assessment Pain Assessment: No/denies pain     Hand Dominance Right   Extremity/Trunk Assessment Upper Extremity Assessment Upper Extremity Assessment: Overall WFL for tasks assessed   Lower Extremity Assessment Lower Extremity Assessment: Overall WFL for tasks assessed   Cervical / Trunk Assessment Cervical / Trunk Assessment: Normal   Communication Communication Communication: No difficulties   Cognition Arousal/Alertness: Awake/alert Behavior During Therapy: WFL for tasks assessed/performed Overall Cognitive Status: Within Functional Limits for tasks assessed                                       General Comments  vitals beginning of session in recliner: 112/64, 80bpm  standing: 99/65, 92bpm, 94%, after ambulation: 112/75,  84bpm, 97% on RA after ambulation    Exercises     Shoulder Instructions      Home Living Family/patient expects to be discharged to:: Private residence Living Arrangements: Spouse/significant other Available Help at Discharge: Family Type of Home: House Home Access: Ramped entrance     [place]: Two level;Able to live on main level with bedroom/bathroom     Bathroom Shower/Tub: Occupational psychologist:  (comfort) Bathroom Accessibility: Yes   Home Equipment: Grab bars - tub/shower          Prior Functioning/Environment Prior Level of Function : Independent/Modified Independent;Driving             Mobility Comments: denies history of falls          OT Problem List:        OT Treatment/Interventions:      OT Goals(Current goals can be found in the care  plan section) Acute Rehab OT Goals OT Goal Formulation: All assessment and education complete, DC therapy  OT Frequency:      Co-evaluation              AM-PAC OT "6 Clicks" Daily Activity     Outcome Measure Help from another person eating meals?: None Help from another person taking care of personal grooming?: None Help from another person toileting, which includes using toliet, bedpan, or urinal?: None Help from another person bathing (including washing, rinsing, drying)?: None Help from another person to put on and taking off regular upper body clothing?: None Help from another person to put on and taking off regular lower body clothing?: None 6 Click Score: 24   End of Session Nurse Communication: Mobility status  Activity Tolerance: Patient tolerated treatment well Patient left: in chair;with call bell/phone within reach;with family/visitor present  OT Visit Diagnosis: Muscle weakness (generalized) (M62.81)                Time: 1059-1106 OT Time Calculation (min): 7 min Charges:  OT General Charges $OT Visit: 1 Visit OT Evaluation $OT Eval Low Complexity: 1 Low  Gustavo Lah, OTR/L [place]  Office [phone]   Lenward Chancellor 04/22/2022, 12:26 PM

## 2022-04-22 NOTE — Discharge Summary (Signed)
Physician Discharge Summary   Patient: Adrian Rios MRN: [id number] DOB: 1940-05-13  Admit date:     04/19/2022  Discharge date: 04/22/22  Discharge Physician: Hosie Poisson   PCP: [name], MD   Recommendations at discharge:  Please follow up with PCp in one week.  Please follow up with IR as recommended/ scheduled.  Please follow up with urology for antegrade ureteroscopy in 2 weeks. Please check cbc and bmp in one week.    Discharge Diagnoses: Principal Problem:   Sepsis associated hypotension ([place]) Active Problems:   Acute pyelonephritis   Acute respiratory failure with hypoxia (HCC)   History of bladder cancer   Complicated UTI (urinary tract infection)   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HCC)   Chronic pain of both shoulders   Severe sepsis with acute organ dysfunction (HCC)   Ureteral stone with hydronephrosis   ARF (acute renal failure) (HCC)   Anemia   Bacteremia due to Klebsiella pneumoniae   Acute nonintractable headache   Hospital Course: Patient 82 year old gentleman history of bladder cancer status post ileal conduit, chronic bronchitis unknown etiology, history of ESBL, depression presented to the ED with fever right flank pain with temperature as high as 103. Patient seen in the ED admitted early on this morning and on presentation noted to be tachycardic with heart rates in the 130s, hypotensive with maps in the 60s, placed on volume resuscitation. Noted to be in acute renal failure, noted to have a leukocytosis. CT abdomen and pelvis done with a 5 mm stone in the distal right ureter with hydronephrosis. Urinalysis done concerning for UTI. Patient pancultured. Patient placed empirically on IV meropenem. Urology consulted. Urology recommended percutaneous nephrostomy tube placement by IR which was done 04/19/2022. Blood cultures now consistent with a Klebsiella pneumonia bacteremia. ID consulted.    Assessment and Plan:   #1 severe sepsis secondary to acute  pyelonephritis/probable ESBL UTI with some associated hypotension, klebsiella paneumoniae bacteremia POA -Patient presented with right flank pain, fever temp of 103, CBC done with a leukocytosis white count as high as 13.1 this morning.  Patient noted to be tachycardic on presentation with heart rate in the 130s.   -Urinalysis done with moderate leukocytes, positive nitrite, many bacteria, 11-20 WBCs. -Blood cultures positive for Klebsiella pneumonia, urine cultures positive for Klebsiella pneumoniae. -Patient was seen in consultation by urology who recommended percutaneous nephrostomy tube placement by IR which was done on 04/19/2022 without any complications. -Due to complex urinary history, right ureteral stone with hydronephrosis, prior history of ESBL UTI, now with a bacteremia which likely seeded from urine ID consulted, recommending repeat blood cultures. Repeat cultures have been negative .  -IV Merrem has been narrowed down to IV Rocephin  to oral levaquin for 13 more days to complete the course.  -Urology following and recommending that patient will need antegrade ureteroscopy in 2 weeks with his primary urologist at [facility].  -Urology, ID following and appreciate input and recommendations.   2.  Acute hypoxic respiratory failure secondary to hypovolemia in the setting of chronic bronchitis, POA -Noted in the ED to have increased O2 requirements as high as 8 L O2 after volume resuscitation per admitting physician patient looked hypervolemic on examination. -IV fluids discontinued patient given a dose of Lasix 20 mg IV x 1. -Patient also started on Pulmicort and Brovana nebs as well as DuoNebs. -Currently with sats of 95% on room air.   3.  GERD -PPI.   4.  Depression -Effexor.    5.  Acute renal failure -Likely multifactorial secondary to postrenal azotemia secondary to ureteral stone with hydronephrosis in the setting of dehydration as patient noted to be hypotensive on  presentation. -Urinalysis with moderate leukocytes, positive nitrites, many bacteria, negative for protein, negative for glucose, WBC 11-20. -aggressively hydrated.  -Urology assessed patient in consultation and recommended percutaneous nephrostomy tube placement which was done per IR on 04/19/2022.   -Urine output of approximately 2 L over the past 24 hours.  -Creatinine trending back down and creatinine is back to baseline.    6.  Right ureteral stone with hydronephrosis -Patient presented right flank pain, fever chills, CT renal stone protocol with right ureteral stone with hydronephrosis. -Patient seen in consultation by urology who recommended percutaneous nephrostomy tube placement per IR which was done 04/19/2022.  -Urine cultures with > 100,000 colonies of Klebsiella pneumoniae. -Was on IV Merrem and has been transitioned to IV Rocephin per ID recommendations. -Per urology patient will require antegrade ureteroscopy after 2 weeks. -Outpatient follow-up with primary urologist at [facility]. - discharged on oral levaquin to complete the course.      7.  Anemia -Likely dilutional. -Patient denies any overt bleeding. -Anemia panel consistent with anemia of chronic disease, iron of 19, TIBC of 174, ferritin 156, folate 15.3, vitamin B12 415.  -Hemoglobin stable at 11.1.    8.  History of bladder cancer status post ileal conduit -Outpatient follow-up with urology. -Urology following.   9.  Headache Resolved.    10.  Constipation RESOLVED.    Consultants:  Urology: Dr. Alyson Ingles 04/19/2022 Interventional radiology: Dr. Denna Haggard 04/19/2022 Infectious disease: Dr.Van Dam 04/20/2022  Procedures performed:   CT renal stone protocol 04/19/2022 Chest x-ray 04/19/2022 PCN placement right side 10 French tube bag drain per IR 04/19/2022  Disposition: Home Diet recommendation:  Discharge Diet Orders (From admission, onward)     Start     Ordered   04/22/22 0000  Diet - low sodium heart  healthy        04/22/22 1210           Regular diet DISCHARGE MEDICATION: Allergies as of 04/22/2022       Reactions   Ivp Dye [iodinated Contrast Media] Hives, Shortness Of Breath, Itching, Palpitations   "eyes itching,  Heart racing,  Effected breathing" 10-27-16 pt with 13 hr pre-meds for CT without any reaction-kj   Metrizamide Shortness Of Breath, Itching, Palpitations   "eyes itching,  Heart racing,  Effected breathing" 10-27-16 pt with 13 hr pre-meds for CT without any reaction-kj   Shellfish Allergy Hives, Shortness Of Breath, Itching   Mostly crab   Gabapentin Other (See Comments)   dizziness and flushing   Niacin-lovastatin Er Other (See Comments)   Did not feel good on medication   Gadolinium Hives   Adhesive [tape] Itching, Rash   Use paper tape        Medication List     TAKE these medications    acetaminophen 500 MG tablet Commonly known as: TYLENOL Take 1,000 mg by mouth as needed for headache (pain).   Advair Diskus 250-50 MCG/ACT Aepb Generic drug: fluticasone-salmeterol INHALE 1 PUFF INTO THE LUNGS EVERY 12 HOURS What changed: See the new instructions.   albuterol 108 (90 Base) MCG/ACT inhaler Commonly known as: VENTOLIN HFA Inhale 2 puffs into the lungs every 6 (six) hours as needed for wheezing or shortness of breath. What changed: how much to take   benzonatate 100 MG capsule Commonly known as: TESSALON Take 1 capsule (100  mg total) by mouth 3 (three) times daily as needed for cough.   cholecalciferol 25 MCG (1000 UNIT) tablet Commonly known as: VITAMIN D3 Take 1,000 Units by mouth daily.   Cinnamon 500 MG capsule Take 500 mg by mouth daily.   GLUCOSAMINE CHOND DOUBLE STR PO Take 1 tablet by mouth 2 (two) times daily.   latanoprost 0.005 % ophthalmic solution Commonly known as: XALATAN Place 1 drop into the right eye at bedtime.   levofloxacin 750 MG tablet Commonly known as: LEVAQUIN Take 1 tablet (750 mg total) by mouth daily  for 13 days. Start taking on: April 23, 2022   magnesium oxide 400 MG tablet Commonly known as: MAG-OX Take 400 mg by mouth daily with supper.   methenamine 1 g tablet Commonly known as: HIPREX Take 1 tablet (1 g total) by mouth 2 (two) times daily with a meal.   montelukast 10 MG tablet Commonly known as: SINGULAIR TAKE ONE TABLET BY MOUTH EVERY NIGHT AT BEDTIME   omeprazole 40 MG capsule Commonly known as: PRILOSEC Take 1 capsule (40 mg total) by mouth daily before breakfast.   triamcinolone 55 MCG/ACT Aero nasal inhaler Commonly known as: NASACORT Place 2 sprays into the nose at bedtime as needed (allergies).   venlafaxine 75 MG tablet Commonly known as: EFFEXOR TAKE 1 AND 1/2 TABLET BY MOUTH DAILY   vitamin C 1000 MG tablet Take 1,000 mg by mouth daily.        Discharge Exam: Filed Weights   04/20/22 2304 04/21/22 0424 04/22/22 1042  Weight: 81.9 kg 81.9 kg 78.5 kg   General exam: Appears calm and comfortable  Respiratory system: Clear to auscultation. Respiratory effort normal. Cardiovascular system: S1 & S2 heard, RRR. No JVD, murmurs,  Gastrointestinal system: Abdomen is nondistended, soft and nontender.  Central nervous system: Alert and oriented. No focal neurological deficits. Extremities: Symmetric 5 x 5 power. Skin: No rashes,  Psychiatry: Mood & affect appropriate.    Condition at discharge: fair  The results of significant diagnostics from this hospitalization (including imaging, microbiology, ancillary and laboratory) are listed below for reference.   Imaging Studies: IR NEPHROSTOMY PLACEMENT RIGHT  Result Date: 04/19/2022 INDICATION: Obstructive right ureteral stone, history of ileal conduit with revision EXAM: Placement of right percutaneous nephrostomy tube using ultrasound and fluoroscopic guidance COMPARISON:  None Available. MEDICATIONS: Documented in the EMR ANESTHESIA/SEDATION: Moderate (conscious) sedation was employed during this  procedure. A total of versed 0.5 mg and Fentanyl 25 mcg was administered intravenously. Moderate Sedation Time: 18 minutes. The patient's level of consciousness and vital signs were monitored continuously by radiology nursing throughout the procedure under my direct supervision. CONTRAST:  7.5 mL Gadavist-administered into the collecting system(s); patient was premedicated with 50 mg Benadryl IV FLUOROSCOPY TIME:  Fluoroscopy Time: 1.6 minutes (10 mGy) COMPLICATIONS: None immediate. PROCEDURE: Informed written consent was obtained from the patient after a thorough discussion of the procedural risks, benefits and alternatives. All questions were addressed. Maximal Sterile Barrier Technique was utilized including caps, mask, sterile gowns, sterile gloves, sterile drape, hand hygiene and skin antiseptic. A timeout was performed prior to the initiation of the procedure. The patient was placed prone on the exam table. The right flank was prepped and draped in the standard sterile fashion. Ultrasound was used to evaluate the right kidney, which demonstrated hydronephrosis. Skin entry site was marked, and local analgesia was obtained with 1% lidocaine. Using ultrasound guidance, an appropriate lower pole posterior calyx was punctured using a 21-gauge Chiba needle.  Entry into the collecting system was confirmed with return of urine, and gentle injection of contrast material under fluoroscopy opacifying the proximal collecting system. An 018 wire was advanced through the needle into the renal pelvis and proximal ureter, followed by placement of a transition dilator. An antegrade nephrostogram was then performed, which demonstrated hydronephrosis. Over an 035 Amplatz wire, the percutaneous tract was serially dilated, and a 10.2 French nephrostomy tube was advanced with the loop formed in the renal pelvis. Appropriate positioning of the nephrostomy tube was confirmed with injection of contrast material. The nephrostomy tube  was secured to skin using silk suture and a dressing. It was attached to bag drainage. The patient tolerated the procedure well without immediate complication. IMPRESSION: Successful placement of a right-sided 10 French percutaneous nephrostomy tube. Nephrostomy tube placed to bag drainage. Electronically Signed   By: Albin Felling M.D.   On: 04/19/2022 13:06   DG Chest Portable 1 View  Result Date: 04/19/2022 CLINICAL DATA:  Hypoxia.  Right flank pain. EXAM: PORTABLE CHEST 1 VIEW COMPARISON:  06/13/2021. FINDINGS: Stable cardiomediastinal contours. Lung volumes are low. No pleural effusion or edema. No airspace opacities. Previous left shoulder arthroplasty. IMPRESSION: Low lung volumes.  No acute abnormality. Electronically Signed   By: Kerby Moors M.D.   On: 04/19/2022 06:00   CT Renal Stone Study  Result Date: 04/19/2022 CLINICAL DATA:  Flank pain on the right for 2 days, initial encounter EXAM: CT ABDOMEN AND PELVIS WITHOUT CONTRAST TECHNIQUE: Multidetector CT imaging of the abdomen and pelvis was performed following the standard protocol without IV contrast. RADIATION DOSE REDUCTION: This exam was performed according to the departmental dose-optimization program which includes automated exposure control, adjustment of the mA and/or kV according to patient size and/or use of iterative reconstruction technique. COMPARISON:  10/09/2021 FINDINGS: Lower chest: No acute abnormality. Hepatobiliary: No focal liver abnormality is seen. No gallstones, gallbladder wall thickening, or biliary dilatation. Pancreas: Unremarkable. No pancreatic ductal dilatation or surrounding inflammatory changes. Spleen: Normal in size without focal abnormality. Adrenals/Urinary Tract: Adrenal glands are within normal limits. Kidneys are well visualized bilaterally. Stable left renal cyst is noted which is simple in nature. No follow-up is recommended. Bilateral hydronephrosis is noted right greater than left. The left  hydronephrosis is stable from the prior study. Previously seen nonobstructing right renal stone has now migrated into the more distal ureter. This is best seen on image number 55 of series 2 and 49 of series 5. This accounts for the increased hydronephrosis and right-sided flank pain. No obstructive changes are noted on the left. Ileal conduit is noted in the right lower quadrant. The bladder has been surgically removed. Stomach/Bowel: Scattered diverticular changes noted in the colon without evidence of diverticulitis. The appendix has been surgically removed. No obstructive or inflammatory changes of the small bowel are noted. Stomach is within normal limits. Vascular/Lymphatic: Aortic atherosclerosis. No enlarged abdominal or pelvic lymph nodes. Reproductive: Prostate has been surgically removed as well. Other: No abdominal wall hernia or abnormality. No abdominopelvic ascites. Musculoskeletal: Degenerative changes of lumbar spine are noted. IMPRESSION: Interval migration of a 5 mm stone from the right kidney into the distal right ureter with increased hydronephrosis. Diverticulosis without diverticulitis. Changes of prior cystectomy with ileal conduit placement Electronically Signed   By: Inez Catalina M.D.   On: 04/19/2022 00:55    Microbiology: Results for orders placed or performed during the hospital encounter of 04/19/22  Blood Culture (routine x 2)     Status: Abnormal  Collection Time: 04/19/22 12:55 AM   Specimen: BLOOD LEFT FOREARM  Result Value Ref Range Status   Specimen Description   Final    BLOOD LEFT FOREARM Performed at [facility], [address] [place], [name] [address]    Special Requests   Final    BOTTLES DRAWN AEROBIC AND ANAEROBIC Blood Culture adequate volume Performed at [facility], [address] [place], [name] [address]    Culture  Setup Time   Final    AEROBIC BOTTLE ONLY GRAM NEGATIVE RODS CRITICAL VALUE NOTED.  VALUE IS CONSISTENT  WITH PREVIOUSLY REPORTED AND CALLED VALUE.    Culture (A)  Final    KLEBSIELLA PNEUMONIAE SUSCEPTIBILITIES PERFORMED ON PREVIOUS CULTURE WITHIN THE LAST 5 DAYS. Performed at [facility], [place] [address] [place], [place] [address]    Report Status 04/22/2022 FINAL  Final  Blood Culture (routine x 2)     Status: Abnormal   Collection Time: 04/19/22  1:00 AM   Specimen: Right Antecubital; Blood  Result Value Ref Range Status   Specimen Description   Final    RIGHT ANTECUBITAL BLOOD Performed at [facility], [place] [address] [place], [place] [address]    Special Requests   Final    BOTTLES DRAWN AEROBIC AND ANAEROBIC Blood Culture adequate volume Performed at [facility], [address] [place], [name] [address]    Culture  Setup Time   Final    GRAM NEGATIVE RODS IN BOTH AEROBIC AND ANAEROBIC BOTTLES CRITICAL RESULT CALLED TO, READ BACK BY AND VERIFIED WITH: L POINDEXTER,PHARMD@0243$  04/20/22 [place] Performed at [facility], [address] [place], [place] [address]    Culture KLEBSIELLA PNEUMONIAE (A)  Final   Report Status 04/21/2022 FINAL  Final   Organism ID, Bacteria KLEBSIELLA PNEUMONIAE  Final      Susceptibility   Klebsiella pneumoniae - MIC*    AMPICILLIN >=32 RESISTANT Resistant     CEFEPIME <=0.12 SENSITIVE Sensitive     CEFTAZIDIME <=1 SENSITIVE Sensitive     CEFTRIAXONE <=0.25 SENSITIVE Sensitive     CIPROFLOXACIN <=0.25 SENSITIVE Sensitive     GENTAMICIN <=1 SENSITIVE Sensitive     IMIPENEM <=0.25 SENSITIVE Sensitive     TRIMETH/SULFA <=20 SENSITIVE Sensitive     AMPICILLIN/SULBACTAM 4 SENSITIVE Sensitive     PIP/TAZO 8 SENSITIVE Sensitive     * KLEBSIELLA PNEUMONIAE  Blood Culture ID Panel (Reflexed)     Status: Abnormal   Collection Time: 04/19/22  1:00 AM  Result Value Ref Range Status   Enterococcus faecalis NOT DETECTED NOT DETECTED Final   Enterococcus Faecium NOT DETECTED NOT DETECTED Final   Listeria monocytogenes NOT  DETECTED NOT DETECTED Final   Staphylococcus species NOT DETECTED NOT DETECTED Final   Staphylococcus aureus (BCID) NOT DETECTED NOT DETECTED Final   Staphylococcus epidermidis NOT DETECTED NOT DETECTED Final   Staphylococcus lugdunensis NOT DETECTED NOT DETECTED Final   Streptococcus species NOT DETECTED NOT DETECTED Final   Streptococcus agalactiae NOT DETECTED NOT DETECTED Final   Streptococcus pneumoniae NOT DETECTED NOT DETECTED Final   Streptococcus pyogenes NOT DETECTED NOT DETECTED Final   A.calcoaceticus-baumannii NOT DETECTED NOT DETECTED Final   Bacteroides fragilis NOT DETECTED NOT DETECTED Final   Enterobacterales DETECTED (A) NOT DETECTED Final    Comment: Enterobacterales represent a large order of gram negative bacteria, not a single organism. CRITICAL RESULT CALLED TO, READ BACK BY AND VERIFIED WITH: L  POINDEXTER,PHARMD@0245$  04/20/22 [place]    Enterobacter cloacae complex NOT DETECTED NOT DETECTED Final   Escherichia coli NOT DETECTED NOT DETECTED Final   Klebsiella aerogenes NOT DETECTED NOT DETECTED Final   Klebsiella oxytoca NOT DETECTED NOT DETECTED Final   Klebsiella pneumoniae DETECTED (A) NOT DETECTED Final    Comment: CRITICAL RESULT CALLED TO, READ BACK BY AND VERIFIED WITH: L POINDEXTER,PHARMD@0244$  04/20/22 [place]    Proteus species NOT DETECTED NOT DETECTED Final   Salmonella species NOT DETECTED NOT DETECTED Final   Serratia marcescens NOT DETECTED NOT DETECTED Final   Haemophilus influenzae NOT DETECTED NOT DETECTED Final   Neisseria meningitidis NOT DETECTED NOT DETECTED Final   Pseudomonas aeruginosa NOT DETECTED NOT DETECTED Final   Stenotrophomonas maltophilia NOT DETECTED NOT DETECTED Final   Candida albicans NOT DETECTED NOT DETECTED Final   Candida auris NOT DETECTED NOT DETECTED Final   Candida glabrata NOT DETECTED NOT DETECTED Final   Candida krusei NOT DETECTED NOT DETECTED Final   Candida parapsilosis NOT DETECTED NOT DETECTED Final   Candida  tropicalis NOT DETECTED NOT DETECTED Final   Cryptococcus neoformans/gattii NOT DETECTED NOT DETECTED Final   CTX-M ESBL NOT DETECTED NOT DETECTED Final   Carbapenem resistance IMP NOT DETECTED NOT DETECTED Final   Carbapenem resistance KPC NOT DETECTED NOT DETECTED Final   Carbapenem resistance NDM NOT DETECTED NOT DETECTED Final   Carbapenem resist OXA 48 LIKE NOT DETECTED NOT DETECTED Final   Carbapenem resistance VIM NOT DETECTED NOT DETECTED Final    Comment: Performed at [facility], [address], [place], [place] [address]  Resp panel by RT-PCR (RSV, Flu A&B, Covid) Anterior Nasal Swab     Status: None   Collection Time: 04/19/22  1:13 AM   Specimen: Anterior Nasal Swab  Result Value Ref Range Status   SARS Coronavirus 2 by RT PCR NEGATIVE NEGATIVE Final    Comment: (NOTE) SARS-CoV-2 target nucleic acids are NOT DETECTED.  The SARS-CoV-2 RNA is generally detectable in upper respiratory specimens during the acute phase of infection. The lowest concentration of SARS-CoV-2 viral copies this assay can detect is 138 copies/mL. A negative result does not preclude SARS-Cov-2 infection and should not be used as the sole basis for treatment or other patient management decisions. A negative result may occur with  improper specimen collection/handling, submission of specimen other than nasopharyngeal swab, presence of viral mutation(s) within the areas targeted by this assay, and inadequate number of viral copies(<138 copies/mL). A negative result must be combined with clinical observations, patient history, and epidemiological information. The expected result is Negative.  Fact Sheet for Patients:  EntrepreneurPulse.com.au  Fact Sheet for Healthcare Providers:  IncredibleEmployment.be  This test is no t yet approved or cleared by the [place] FDA and  has been authorized for detection and/or diagnosis of SARS-CoV-2 by FDA under an  Emergency Use Authorization (EUA). This EUA will remain  in effect (meaning this test can be used) for the duration of the COVID-19 declaration under Section 564(b)(1) of the Act, 21 U.S.C.section 360bbb-3(b)(1), unless the authorization is terminated  or revoked sooner.       Influenza A by PCR NEGATIVE NEGATIVE Final   Influenza B by PCR NEGATIVE NEGATIVE Final    Comment: (NOTE) The Xpert Xpress SARS-CoV-2/FLU/RSV plus assay is intended as an aid in the diagnosis of influenza from Nasopharyngeal swab specimens and should not be used as a sole basis for treatment. Nasal washings and aspirates are unacceptable for Xpert Xpress SARS-CoV-2/FLU/RSV  testing.  Fact Sheet for Patients: EntrepreneurPulse.com.au  Fact Sheet for Healthcare Providers: IncredibleEmployment.be  This test is not yet approved or cleared by the [place] FDA and has been authorized for detection and/or diagnosis of SARS-CoV-2 by FDA under an Emergency Use Authorization (EUA). This EUA will remain in effect (meaning this test can be used) for the duration of the COVID-19 declaration under Section 564(b)(1) of the Act, 21 U.S.C. section 360bbb-3(b)(1), unless the authorization is terminated or revoked.     Resp Syncytial Virus by PCR NEGATIVE NEGATIVE Final    Comment: (NOTE) Fact Sheet for Patients: EntrepreneurPulse.com.au  Fact Sheet for Healthcare Providers: IncredibleEmployment.be  This test is not yet approved or cleared by the [place] FDA and has been authorized for detection and/or diagnosis of SARS-CoV-2 by FDA under an Emergency Use Authorization (EUA). This EUA will remain in effect (meaning this test can be used) for the duration of the COVID-19 declaration under Section 564(b)(1) of the Act, 21 U.S.C. section 360bbb-3(b)(1), unless the authorization is terminated or revoked.  Performed at [facility], [address] [place], [name] [address]   Urine Culture (for pregnant, neutropenic or urologic patients or patients with an indwelling urinary catheter)     Status: Abnormal   Collection Time: 04/19/22  1:47 AM   Specimen: Urine, Clean Catch  Result Value Ref Range Status   Specimen Description   Final    URINE, CLEAN CATCH Performed at [facility], [address] [place], [place] [address]    Special Requests   Final    NONE Performed at [facility], [address] [place], [name] [address]    Culture >=100,000 COLONIES/mL KLEBSIELLA PNEUMONIAE (A)  Final   Report Status 04/21/2022 FINAL  Final   Organism ID, Bacteria KLEBSIELLA PNEUMONIAE (A)  Final      Susceptibility   Klebsiella pneumoniae - MIC*    AMPICILLIN >=32 RESISTANT Resistant     CEFAZOLIN <=4 SENSITIVE Sensitive     CEFEPIME <=0.12 SENSITIVE Sensitive     CEFTRIAXONE <=0.25 SENSITIVE Sensitive     CIPROFLOXACIN <=0.25 SENSITIVE Sensitive     GENTAMICIN <=1 SENSITIVE Sensitive     IMIPENEM <=0.25 SENSITIVE Sensitive     NITROFURANTOIN 128 RESISTANT Resistant     TRIMETH/SULFA <=20 SENSITIVE Sensitive     AMPICILLIN/SULBACTAM 4 SENSITIVE Sensitive     PIP/TAZO 8 SENSITIVE Sensitive     * >=100,000 COLONIES/mL KLEBSIELLA PNEUMONIAE  MRSA Next Gen by PCR, Nasal     Status: None   Collection Time: 04/19/22  1:26 PM   Specimen: Nasal Mucosa; Nasal Swab  Result Value Ref Range Status   MRSA by PCR Next Gen NOT DETECTED NOT DETECTED Final    Comment: (NOTE) The GeneXpert MRSA Assay (FDA approved for NASAL specimens only), is one component of a comprehensive MRSA colonization surveillance program. It is not intended to diagnose MRSA infection nor to guide or monitor treatment for MRSA infections. Test performance is not FDA approved in patients less than 33 years old. Performed at [facility], [place] [address] [place], [place] [address]   Culture, blood  (Routine X 2) w Reflex to ID Panel     Status: None (Preliminary result)   Collection Time: 04/21/22 10:00 AM   Specimen: BLOOD  Result Value Ref Range Status   Specimen Description   Final    BLOOD BLOOD RIGHT ARM Performed at [facility],  [place] [address] [place], [place] [address]    Special Requests   Final    BOTTLES DRAWN AEROBIC AND ANAEROBIC Blood Culture adequate volume Performed at [place] [address] [place], [place] [address]    Culture   Final    NO GROWTH < 24 HOURS Performed at [place] [address], [place], [place] [address]    Report Status PENDING  Incomplete  Culture, blood (Routine X 2) w Reflex to ID Panel     Status: None (Preliminary result)   Collection Time: 04/21/22 10:31 AM   Specimen: BLOOD  Result Value Ref Range Status   Specimen Description   Final    BLOOD BLOOD RIGHT ARM Performed at [place] [address], [place], [place] [address]    Special Requests   Final    BOTTLES DRAWN AEROBIC ONLY Blood Culture results may not be optimal due to an inadequate volume of blood received in culture bottles Performed at [place] [address] [place], [place] [address]    Culture   Final    NO GROWTH < 24 HOURS Performed at [place] [address], [place], [place] [address]    Report Status PENDING  Incomplete    Labs: CBC: Recent Labs  Lab 04/19/22 0112 04/19/22 0535 04/20/22 0307 04/21/22 0244 04/22/22 0441  WBC 11.2* 13.1* 13.5* 12.0* 10.0  NEUTROABS 9.6*  --  11.2* 8.9* 6.9  HGB 13.0 9.0* 11.0* 11.1* 11.1*  HCT 39.1 28.4* 34.0* 35.0* 33.8*  MCV 92.7 99.0 97.4 95.6 94.4  PLT 280 178 225 225 0000000   Basic Metabolic Panel: Recent Labs  Lab 04/19/22 0112 04/19/22 0535 04/20/22 0307 04/21/22 0244 04/22/22 0441  NA 135  --  137 137 138  K 3.9  --  3.9 3.7 3.6  CL 110  --  109 106 107  CO2 20*  --  19* 21* 23   GLUCOSE 151*  --  135* 110* 110*  BUN 23  --  24* 21 14  CREATININE 1.88* 1.60* 1.83* 1.34* 1.06  CALCIUM 8.5*  --  8.5* 8.5* 8.5*  MG  --   --  2.0  --   --   PHOS  --   --   --  2.5 2.5   Liver Function Tests: Recent Labs  Lab 04/19/22 0112 04/20/22 0307 04/21/22 0244 04/22/22 0441  AST 30 26  --   --   ALT 28 27  --   --   ALKPHOS 108 72  --   --   BILITOT 0.5 0.7  --   --   PROT 7.0 6.2*  --   --   ALBUMIN 3.4* 3.3* 3.2* 3.1*   CBG: No results for input(s): "GLUCAP" in the last 168 hours.  Discharge time spent: 40 minutes.   Signed: Hosie Poisson, MD [facility] 04/22/2022

## 2022-04-22 NOTE — TOC Transition Note (Signed)
Transition of Care [facility]) - CM/SW Discharge Note   Patient Details  Name: Adrian Rios MRN: [id number] Date of Birth: 07/30/1940  [facility] CM/SW Contact:  Dessa Phi, RN Phone Number: 04/22/2022, 12:23 PM   Clinical Narrative: No orders or CM needs.      Final next level of care: Home/Self Care Barriers to Discharge: No Barriers Identified   Patient Goals and CMS Choice CMS Medicare.gov Compare Post Acute Care list provided to:: Patient Represenative (must comment) (Wife: [name]) Choice offered to / list presented to : Spouse  Discharge Placement                         Discharge Plan and Services Additional resources added to the After Visit Summary for   In-house Referral: NA Discharge Planning Services: CM Consult            DME Arranged: N/A DME Agency: NA       HH Arranged: NA HH Agency: NA        Social Determinants of Health (SDOH) Interventions SDOH Screenings   Food Insecurity: No Food Insecurity (04/19/2022)  Housing: Low Risk  (04/19/2022)  Transportation Needs: No Transportation Needs (04/19/2022)  Utilities: Not At Risk (04/19/2022)  Alcohol Screen: Low Risk  (11/01/2020)  Depression (PHQ2-9): Low Risk  (10/22/2021)  Financial Resource Strain: Low Risk  (11/01/2020)  Physical Activity: Sufficiently Active (11/01/2020)  Social Connections: Socially Integrated (11/01/2020)  Stress: No Stress Concern Present (11/01/2020)  Tobacco Use: Low Risk  (04/19/2022)     Readmission Risk Interventions     No data to display

## 2022-04-22 NOTE — Care Management Important Message (Signed)
Important Message  Patient Details IM Letter given. Name: Adrian Rios MRN: [id number] Date of Birth: 1940/04/25   Medicare Important Message Given:  Yes     Kerin Salen 04/22/2022, 10:47 AM

## 2022-04-23 ENCOUNTER — Telehealth: Payer: Self-pay | Admitting: *Deleted

## 2022-04-23 ENCOUNTER — Encounter: Payer: Self-pay | Admitting: *Deleted

## 2022-04-23 NOTE — Transitions of Care (Post Inpatient/ED Visit) (Signed)
   04/23/2022  Name: Adrian Rios MRN: [id number] DOB: [birth year]  Today's TOC FU Call Status: Today's TOC FU Call Status:: Unsuccessul Call (1st Attempt) Unsuccessful Call (1st Attempt) Date: 04/23/22  Attempted to reach the patient regarding the most recent Inpatient visit; left voice message requesting call back  Follow Up Plan: Additional outreach attempts will be made to reach the patient to complete the Transitions of Care (Post Inpatient visit) call   [name], RN, BSN, CCRN Alumnus RN CM Care Coordination/ Transition of [place] Management [phone]: direct office

## 2022-04-24 ENCOUNTER — Telehealth: Payer: Self-pay | Admitting: *Deleted

## 2022-04-24 ENCOUNTER — Encounter: Payer: Self-pay | Admitting: *Deleted

## 2022-04-24 NOTE — Transitions of Care (Post Inpatient/ED Visit) (Signed)
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 How have you been since you were released from the hospital?: Better ("I think this was the longest time I have not had to go to the hospital for a UTI- it had been 6 months since my last hospital visit; I think the medicine they put me on (methenamine) several months ago to help prevent UTI's must be helping") Any questions or concerns?: No  Items Reviewed: Did you receive and understand the discharge instructions provided?: Yes (thoroughly reviewed post-discharge instruction AVS with patient who verbalizes good understanding of same) Medications obtained and verified?: Yes (Medications Reviewed) (Full medication review completed; no discrepancies or concerns identified; confirmed pt. obtained/ is taking all newly Rx'd meds; self-manages medications; denies questions/ concerns aorund medications today) Any new allergies since your discharge?: No Dietary orders reviewed?: Yes Type of Diet Ordered:: Essentially- regular/ heart healthy Do you have support at home?: Yes People in Home: spouse Name of Support/Comfort Primary Source: wife assists in care needs as indicated- patient reports he continues to be independent in self-care activities  [place] and Equipment/Supplies: [place] Ordered?: No Any new equipment or medical supplies ordered?: No  Functional Questionnaire: Do you need assistance with bathing/showering or dressing?: No Do you need assistance with meal preparation?: No Do you need assistance with  eating?: No Do you have difficulty maintaining continence: No Do you need assistance with getting out of bed/getting out of a chair/moving?: No Do you have difficulty managing or taking your medications?: No  Folllow up appointments reviewed: PCP Follow-up appointment confirmed?: Yes Date of PCP follow-up appointment?: 04/29/22 Follow-up Provider: PCP; [name] Specialist [facility] Follow-up appointment confirmed?: No Reason Specialist Follow-Up Not Confirmed: Patient has Specialist Provider Number and will Call for Appointment Do you need transportation to your follow-up appointment?: No Do you understand care options if your condition(s) worsen?: Yes-patient verbalized understanding  SDOH Interventions Today    Flowsheet Row Most Recent Value  SDOH Interventions   Food Insecurity Interventions Intervention Not Indicated  Transportation Interventions Intervention Not Indicated  [drives self]      TOC Interventions Today    Flowsheet Row Most Recent Value  TOC Interventions   TOC Interventions Discussed/Reviewed TOC Interventions Discussed, S/S of infection      Interventions Today    Flowsheet Row Most Recent Value  Chronic Disease   Chronic disease during today's visit Other  [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- sees urology specialist at [place] Interventions   General Interventions Discussed/Reviewed General Interventions Discussed, Doctor Visits, Referral to Nurse, Communication with  Doctor Visits Discussed/Reviewed Doctor Visits Discussed, PCP, Specialist  [pt reports he is unaware of upcoming scheduled IR appt for "exchange, " scheduled on 04/23/22 for 06/08/22 at [facility] IR department,  patient declines need for assistance in following up on this appointment,  states he will contact IR department- has number]  PCP/Specialist Visits Compliance with follow-up visit  Communication with RN  [scheduled for post-TOC folllow up telephone call with RN  CM Care Coordinator on 05/06/22]  Education Interventions   Education Provided Provided Education  Provided Verbal  Education On When to see the doctor, Other  [reinforced signs/ symptoms UTI- patient verbalizes very good understanding of same,  made patient aware of IR appointment scheudled 04/23/22 for 06/08/22]  Nutrition Interventions   Nutrition Discussed/Reviewed Nutrition Discussed  Pharmacy Interventions   Pharmacy Dicussed/Reviewed Pharmacy Topics Discussed  [Full medication review completed]      [name], RN, BSN, CCRN Alumnus RN CM Care Coordination/ Transition of [place] Management [phone]: direct office

## 2022-04-26 LAB — CULTURE, BLOOD (ROUTINE X 2)
Culture: NO GROWTH
Culture: NO GROWTH
Special Requests: ADEQUATE

## 2022-04-28 DIAGNOSIS — N201 Calculus of ureter: Secondary | ICD-10-CM | POA: Diagnosis not present

## 2022-04-29 ENCOUNTER — Encounter: Payer: Self-pay | Admitting: Internal Medicine

## 2022-04-29 ENCOUNTER — Ambulatory Visit (INDEPENDENT_AMBULATORY_CARE_PROVIDER_SITE_OTHER): Payer: Medicare Other | Admitting: Internal Medicine

## 2022-04-29 VITALS — BP 112/78 | HR 85 | Temp 98.5°F | Ht 67.0 in | Wt 169.0 lb

## 2022-04-29 DIAGNOSIS — N201 Calculus of ureter: Secondary | ICD-10-CM | POA: Diagnosis not present

## 2022-04-29 DIAGNOSIS — J9601 Acute respiratory failure with hypoxia: Secondary | ICD-10-CM

## 2022-04-29 DIAGNOSIS — Z8709 Personal history of other diseases of the respiratory system: Secondary | ICD-10-CM

## 2022-04-29 DIAGNOSIS — N390 Urinary tract infection, site not specified: Secondary | ICD-10-CM

## 2022-04-29 DIAGNOSIS — B370 Candidal stomatitis: Secondary | ICD-10-CM | POA: Insufficient documentation

## 2022-04-29 DIAGNOSIS — Z8619 Personal history of other infectious and parasitic diseases: Secondary | ICD-10-CM | POA: Diagnosis not present

## 2022-04-29 DIAGNOSIS — N179 Acute kidney failure, unspecified: Secondary | ICD-10-CM

## 2022-04-29 DIAGNOSIS — I959 Hypotension, unspecified: Secondary | ICD-10-CM

## 2022-04-29 DIAGNOSIS — Z87440 Personal history of urinary (tract) infections: Secondary | ICD-10-CM | POA: Diagnosis not present

## 2022-04-29 MED ORDER — NYSTATIN 100000 UNIT/ML MT SUSP: 5.0000 mL | Freq: Four times a day (QID) | OROMUCOSAL | 0 refills | Status: DC

## 2022-04-29 NOTE — Assessment & Plan Note (Signed)
Resolved Received IVF, nephrostomy tube placed, IV abx GFR > 60 at discharge

## 2022-04-29 NOTE — Patient Instructions (Addendum)
     Call [facility] imaging - [place]  [phone]    A Ct scan was ordered    Nystatin swish and spit for yeast infection in mouth

## 2022-04-29 NOTE — Assessment & Plan Note (Signed)
In distal right ureter - cause of infection, ARF S/p percutaneous nephrostomy tube placement on 2/18 [facility] yesterday - for right antegrade ureteroscopy 3/15 Recheck CT scan to see if he has passed the stone 2-3 days prior - I have ordered this so he can have it done in [place]

## 2022-04-29 NOTE — Assessment & Plan Note (Addendum)
Resolved Received IVF, iV meropenem BP here today is very good 112/78 He is drinking a lot of fluids

## 2022-04-29 NOTE — Assessment & Plan Note (Signed)
Resolved Likely related to fluid overload - improved with lasix IV, oxygen supplementation, neb treatments Lungs clear Has mild dry cough, but no SOB, wheeze

## 2022-04-29 NOTE — Progress Notes (Signed)
Subjective:    Patient ID: Adrian Rios, male    DOB: 1941/01/05, 82 y.o.   MRN: [id number]     HPI Adrian Rios is here for follow up from the hospital.   Admitted 2/18 - 2/21 for sepsis assoc hypotension   Presented to ED with fever of 103, right flank pain, tachycardic in 130's, hypotensive.  He was found to have ARF, leukocytosis, UTI, CT abd/pelvis showed 5 mm stone in distal right ureter with hydronephrosis.  Started on IVF, IV meropenem.  Urology c/s.  IR did percutaneous nephrostomy tube on 2/18.  Blood Cx showed Klebsiella pneumonia.   ID c/s.  Repeat blood cultures negative.  IV meropenem changed to IV Rocephin then to oral levaquin x 13 more days.  Urology advised he needed antegrade ureteroscopy in 2 weeks with primary urologist at [facility].    Also had acute hypoxic respiratory failure secondary to hypovolemia in setting of chronic bronchitis - required O2 up to 8 L in ED.  After IVF looked hypervolemic.  IVF stopped, given lasix 20 mg IV x 1.  Started on pulmicort, brovana nebs,   by discharge Sats on RA were 95%.   Acute renal failure - multifactorial - postrenal azotemia from ureteral stone w/ hydronephrosis from dehydration, UTI. Had percutaneous nephrostomy tube placement on 2/18  Cr trended back to baseline  Anemia - likely dilutional.  Hg stable at 11.1 - anemia c/w anemia of chronic disease   GERD, depression - stable  He saw Adrian Rios urology yesterday.  To have stone removed 3/15 via right antegrade ureteroscopy, Ct 2 days prior to see if stone passed.   They would like me to order the CT scan so he can have it done in [facility] - urology will review prior to 3/15 - hopefully he will pass the stone prior to that.   He denies fever, abdominal pain, SOB.  He does have a dry cough.  He also states a weird feeling in his mouth - he thinks it is from all the nebulizer treatments.  His appetite is still low.   He has been having headaches and some lightheadedness.         Medications and allergies reviewed with patient and updated if appropriate.  Current Outpatient Medications on File Prior to Visit  Medication Sig Dispense Refill   acetaminophen (TYLENOL) 500 MG tablet Take 1,000 mg by mouth as needed for headache (pain).     ADVAIR DISKUS 250-50 MCG/ACT AEPB INHALE 1 PUFF INTO THE LUNGS EVERY 12 HOURS (Patient taking differently: Inhale 1 puff into the lungs daily.) 60 each 2   albuterol (VENTOLIN HFA) 108 (90 Base) MCG/ACT inhaler Inhale 2 puffs into the lungs every 6 (six) hours as needed for wheezing or shortness of breath. (Patient taking differently: Inhale 1-2 puffs into the lungs every 6 (six) hours as needed for wheezing or shortness of breath.) 1 each 8   Ascorbic Acid (VITAMIN C) 1000 MG tablet Take 1,000 mg by mouth daily.     benzonatate (TESSALON) 100 MG capsule Take 1 capsule (100 mg total) by mouth 3 (three) times daily as needed for cough. 20 capsule 0   cholecalciferol (VITAMIN D3) 25 MCG (1000 UNIT) tablet Take 1,000 Units by mouth daily.     Cinnamon 500 MG capsule Take 500 mg by mouth daily.     latanoprost (XALATAN) 0.005 % ophthalmic solution Place 1 drop into the right eye at bedtime.     levofloxacin (LEVAQUIN)  750 MG tablet Take 1 tablet (750 mg total) by mouth daily for 13 days. 13 tablet 0   magnesium oxide (MAG-OX) 400 MG tablet Take 400 mg by mouth daily with supper.     methenamine (HIPREX) 1 g tablet Take 1 tablet (1 g total) by mouth 2 (two) times daily with a meal.     Misc Natural Products (GLUCOSAMINE CHOND DOUBLE STR PO) Take 1 tablet by mouth 2 (two) times daily.     montelukast (SINGULAIR) 10 MG tablet TAKE ONE TABLET BY MOUTH EVERY NIGHT AT BEDTIME 90 tablet 2   omeprazole (PRILOSEC) 40 MG capsule Take 1 capsule (40 mg total) by mouth daily before breakfast. 30 capsule 5   triamcinolone (NASACORT) 55 MCG/ACT AERO nasal inhaler Place 2 sprays into the nose at bedtime as needed (allergies).     venlafaxine  (EFFEXOR) 75 MG tablet TAKE 1 AND 1/2 TABLET BY MOUTH DAILY 135 tablet 1   No current facility-administered medications on file prior to visit.     Review of Systems  Constitutional:  Positive for appetite change. Negative for fever.  Respiratory:  Positive for cough. Negative for shortness of breath and wheezing.   Cardiovascular:  Negative for chest pain, palpitations and leg swelling.  Gastrointestinal:  Negative for abdominal pain and nausea.  Neurological:  Positive for light-headedness (mild) and headaches. Negative for dizziness.       Objective:   Vitals:   04/29/22 1538  BP: 112/78  Pulse: 85  Temp: 98.5 F (36.9 C)  SpO2: 95%   BP Readings from Last 3 Encounters:  04/29/22 112/78  04/22/22 114/67  12/15/21 122/82   Wt Readings from Last 3 Encounters:  04/29/22 169 lb (76.7 kg)  04/22/22 173 lb (78.5 kg)  12/15/21 181 lb (82.1 kg)   Body mass index is 26.47 kg/m.    Physical Exam Constitutional:      General: He is not in acute distress.    Appearance: Normal appearance. He is not ill-appearing.  HENT:     Head: Normocephalic and atraumatic.     Mouth/Throat:     Mouth: Mucous membranes are moist.     Pharynx: No oropharyngeal exudate.     Comments: Tongue is beefy red, no white coating Eyes:     Conjunctiva/sclera: Conjunctivae normal.  Cardiovascular:     Rate and Rhythm: Normal rate and regular rhythm.     Heart sounds: Normal heart sounds.  Pulmonary:     Effort: Pulmonary effort is normal. No respiratory distress.     Breath sounds: Normal breath sounds. No wheezing or rales.  Musculoskeletal:     Right lower leg: No edema.     Left lower leg: No edema.  Skin:    General: Skin is warm and dry.     Findings: No rash.  Neurological:     Mental Status: He is alert. Mental status is at baseline.  Psychiatric:        Mood and Affect: Mood normal.        Lab Results  Component Value Date   WBC 10.0 04/22/2022   HGB 11.1 (L) 04/22/2022    HCT 33.8 (L) 04/22/2022   PLT 246 04/22/2022   GLUCOSE 110 (H) 04/22/2022   CHOL 146 08/02/2020   TRIG 130.0 08/02/2020   HDL 41.50 08/02/2020   LDLCALC 79 08/02/2020   ALT 27 04/20/2022   AST 26 04/20/2022   NA 138 04/22/2022   K 3.6 04/22/2022   CL 107  04/22/2022   CREATININE 1.06 04/22/2022   BUN 14 04/22/2022   CO2 23 04/22/2022   TSH 1.17 10/28/2018   PSA 0.29 07/11/2009   INR 1.1 04/19/2022   HGBA1C 5.7 06/25/2021   MICROALBUR 0.3 06/29/2006     Assessment & Plan:    See Problem List for Assessment and Plan of chronic medical problems.

## 2022-04-29 NOTE — Assessment & Plan Note (Signed)
New Likely result of abx and neb treatments in hospital - one was a steroid Start nystatin S & S 4 times daily x 10 days

## 2022-04-29 NOTE — Assessment & Plan Note (Signed)
Recurrent Recent hospitalization with UTI, sepsis, ureteral stone, ARF Treated with IV meropenum Percutaneous nephrostomy tube place IVF improved ARF To have f/u of ureteral stone with CT and possible right antegrade ureteroscopy if needed

## 2022-05-06 ENCOUNTER — Telehealth: Payer: Self-pay

## 2022-05-06 DIAGNOSIS — C678 Malignant neoplasm of overlapping sites of bladder: Secondary | ICD-10-CM | POA: Diagnosis not present

## 2022-05-06 DIAGNOSIS — D869 Sarcoidosis, unspecified: Secondary | ICD-10-CM | POA: Diagnosis not present

## 2022-05-06 DIAGNOSIS — Z01818 Encounter for other preprocedural examination: Secondary | ICD-10-CM | POA: Diagnosis not present

## 2022-05-06 DIAGNOSIS — K219 Gastro-esophageal reflux disease without esophagitis: Secondary | ICD-10-CM | POA: Diagnosis not present

## 2022-05-06 NOTE — Patient Outreach (Signed)
  Care Coordination   05/06/2022 Name: Adrian Rios MRN: [id number] DOB: Aug 13, 1940   Care Coordination Outreach Attempts:  An unsuccessful telephone outreach was attempted today to offer the patient information about available care coordination services as a benefit of their health plan.   Follow Up Plan:  Additional outreach attempts will be made to offer the patient care coordination information and services.   Encounter Outcome:  No Answer   Care Coordination Interventions:  No, not indicated    Thea Silversmith, RN, MSN, BSN, [place] Coordinator [phone]

## 2022-05-13 ENCOUNTER — Ambulatory Visit
Admission: RE | Admit: 2022-05-13 | Discharge: 2022-05-13 | Disposition: A | Discharge status: Home / Self Care | Payer: Medicare Other | Source: Ambulatory Visit | Attending: Internal Medicine | Admitting: Internal Medicine

## 2022-05-13 DIAGNOSIS — M47814 Spondylosis without myelopathy or radiculopathy, thoracic region: Secondary | ICD-10-CM | POA: Diagnosis not present

## 2022-05-13 DIAGNOSIS — N281 Cyst of kidney, acquired: Secondary | ICD-10-CM | POA: Diagnosis not present

## 2022-05-13 DIAGNOSIS — N201 Calculus of ureter: Secondary | ICD-10-CM

## 2022-05-13 DIAGNOSIS — N200 Calculus of kidney: Secondary | ICD-10-CM | POA: Diagnosis not present

## 2022-05-13 DIAGNOSIS — K5730 Diverticulosis of large intestine without perforation or abscess without bleeding: Secondary | ICD-10-CM | POA: Diagnosis not present

## 2022-05-15 DIAGNOSIS — R918 Other nonspecific abnormal finding of lung field: Secondary | ICD-10-CM | POA: Diagnosis not present

## 2022-05-15 DIAGNOSIS — N201 Calculus of ureter: Secondary | ICD-10-CM | POA: Diagnosis not present

## 2022-05-15 DIAGNOSIS — Z91048 Other nonmedicinal substance allergy status: Secondary | ICD-10-CM | POA: Diagnosis not present

## 2022-05-15 DIAGNOSIS — N200 Calculus of kidney: Secondary | ICD-10-CM | POA: Diagnosis not present

## 2022-05-15 DIAGNOSIS — Z8551 Personal history of malignant neoplasm of bladder: Secondary | ICD-10-CM | POA: Diagnosis not present

## 2022-05-15 DIAGNOSIS — Z8616 Personal history of COVID-19: Secondary | ICD-10-CM | POA: Diagnosis not present

## 2022-05-15 DIAGNOSIS — Z8546 Personal history of malignant neoplasm of prostate: Secondary | ICD-10-CM | POA: Diagnosis not present

## 2022-05-15 DIAGNOSIS — Z79899 Other long term (current) drug therapy: Secondary | ICD-10-CM | POA: Diagnosis not present

## 2022-05-15 DIAGNOSIS — Z888 Allergy status to other drugs, medicaments and biological substances status: Secondary | ICD-10-CM | POA: Diagnosis not present

## 2022-05-15 DIAGNOSIS — E785 Hyperlipidemia, unspecified: Secondary | ICD-10-CM | POA: Diagnosis not present

## 2022-05-16 DIAGNOSIS — Z8546 Personal history of malignant neoplasm of prostate: Secondary | ICD-10-CM | POA: Diagnosis not present

## 2022-05-16 DIAGNOSIS — Z79899 Other long term (current) drug therapy: Secondary | ICD-10-CM | POA: Diagnosis not present

## 2022-05-16 DIAGNOSIS — Z8616 Personal history of COVID-19: Secondary | ICD-10-CM | POA: Diagnosis not present

## 2022-05-16 DIAGNOSIS — Z888 Allergy status to other drugs, medicaments and biological substances status: Secondary | ICD-10-CM | POA: Diagnosis not present

## 2022-05-16 DIAGNOSIS — Z91048 Other nonmedicinal substance allergy status: Secondary | ICD-10-CM | POA: Diagnosis not present

## 2022-05-16 DIAGNOSIS — N200 Calculus of kidney: Secondary | ICD-10-CM | POA: Diagnosis not present

## 2022-05-19 DIAGNOSIS — N201 Calculus of ureter: Secondary | ICD-10-CM | POA: Diagnosis not present

## 2022-05-19 DIAGNOSIS — Z9049 Acquired absence of other specified parts of digestive tract: Secondary | ICD-10-CM | POA: Diagnosis not present

## 2022-05-26 DIAGNOSIS — N200 Calculus of kidney: Secondary | ICD-10-CM | POA: Diagnosis not present

## 2022-05-29 ENCOUNTER — Ambulatory Visit: Payer: Self-pay

## 2022-05-29 NOTE — Patient Outreach (Signed)
  Care Coordination   Initial Visit Note   05/29/2022 Name: STEPHAUN BORDERS MRN: [id number] DOB: [birth year]  Harvel Ricks Pile is a 82 y.o. year old male who sees Burns, Claudina Lick, MD for primary care. I spoke with  Lelon Frohlich and spouse per patient consent by phone today.  What matters to the patients health and wellness today?  Admission 04/19/22-04/22/22 with sepsis, acute pyelonephritis. Mr. Alfaro reports he is slowly improving since discharge. Office visit with urologist on 05/26/22-nephrostomy tube pulled. Per Mr. Tworek patient was started on antibiotic due to redness and pain at insertion site-Patient reports improved.  Goals Addressed             This Visit's Progress    continue to improve post hospitalization       Interventions Today    Flowsheet Row Most Recent Value  Chronic Disease   Chronic disease during today's visit Other  [post hospitalization sepsis, pyelonephritis (04/19/22-04/22/22)]  General Interventions   General Interventions Discussed/Reviewed General Interventions Discussed, Doctor Visits  Doctor Visits Discussed/Reviewed Doctor Visits Discussed, Doctor Visits Reviewed, Specialist  [reviewed patient instructions from urology visit 05/26/22 and 05/27/22 telephone call. Provided positive feedback regarding self managment of care.]  PCP/Specialist Visits Compliance with follow-up visit  Education Interventions   Education Provided Provided Education  Provided Verbal Education On When to see the doctor           SDOH assessments and interventions completed:  Yes  SDOH Interventions Today    Flowsheet Row Most Recent Value  SDOH Interventions   Food Insecurity Interventions Intervention Not Indicated  Housing Interventions Intervention Not Indicated  Transportation Interventions Intervention Not Indicated  Utilities Interventions Intervention Not Indicated     Care Coordination Interventions:  Yes, provided   Follow up plan: Follow up call scheduled for  06/12/22    Encounter Outcome:  Pt. Visit Completed   Thea Silversmith, RN, MSN, BSN, [place] Coordinator [phone]

## 2022-05-29 NOTE — Patient Instructions (Addendum)
Visit Information  Thank you for taking time to visit with me today. Please don't hesitate to contact me if I can be of assistance to you.   Following are the goals we discussed today:   Goals Addressed             This Visit's Progress    continue to improve post hospitalization       Interventions Today    Flowsheet Row Most Recent Value  Chronic Disease   Chronic disease during today's visit Other  [post hospitalization sepsis, pyelonephritis (04/19/22-04/22/22)]  General Interventions   General Interventions Discussed/Reviewed General Interventions Discussed, Doctor Visits  Doctor Visits Discussed/Reviewed Doctor Visits Discussed, Doctor Visits Reviewed, Specialist  [reviewed patient instructions from urology visit 05/26/22 and 05/27/22 telephone call. Provided positive feedback regarding self managment of care.]  PCP/Specialist Visits Compliance with follow-up visit  Education Interventions   Education Provided Provided Education  Provided Verbal Education On When to see the doctor           Our next appointment is by telephone on 06/12/22 at 11:00 am  Please call the care guide team at [phone] if you need to cancel or reschedule your appointment.   If you are experiencing a Mental Health or [place] or need someone to talk to, please call the Suicide and Crisis Lifeline: [place], RN, MSN, BSN, [place] [phone]

## 2022-06-07 ENCOUNTER — Encounter: Payer: Self-pay | Admitting: Internal Medicine

## 2022-06-08 ENCOUNTER — Other Ambulatory Visit (HOSPITAL_COMMUNITY): Payer: Medicare Other

## 2022-06-12 ENCOUNTER — Ambulatory Visit: Payer: Self-pay

## 2022-06-12 NOTE — Patient Instructions (Signed)
Visit Information  Thank you for taking time to visit with me today. Please don't hesitate to contact me if I can be of assistance to you.   Following are the goals we discussed today:   Goals Addressed             This Visit's Progress    continue to improve post hospitalization       Interventions Today    Flowsheet Row Most Recent Value  Chronic Disease   Chronic disease during today's visit Other  [post hospitalization sepsis, pyelonephritis (04/19/22-04/22/22)]  General Interventions   General Interventions Discussed/Reviewed General Interventions Reviewed, Doctor Visits, Communication with  Doctor Visits Discussed/Reviewed PCP  Communication with PCP/Specialists  [facility] called PCP office to schedule appointment. scheduled visit  for 06/16/22. RNCM called and notified patient. he was also presented with other choices for appointment date time and prefers to see Dr. Burns.]  Education Interventions   Education Provided Provided Education  Provided Verbal Education On Other, When to see the doctor  [fall prevention strategies, advised see PCP regarding dizziness, discussed monitoring blood pressure for changes in pressure when standing.]  Safety Interventions   Safety Discussed/Reviewed Fall Risk  [discussed fall prevention strategies: Patient encouraged to change postition slowly, keep walkways clear, good lighting, use assistive device if needed]           Our next appointment is by telephone on 06/26/22 at 9 am  Please call the care guide team at [phone] if you need to cancel or reschedule your appointment.   If you are experiencing a Mental Health or Behavioral Health Crisis or need someone to talk to, please call the Suicide and Crisis Lifeline: 29  Kathyrn Sheriff, RN, MSN, BSN, CCM [facility] Care Coordinator [phone]

## 2022-06-12 NOTE — Patient Outreach (Signed)
  Care Coordination   Follow Up Visit Note   06/12/2022 Name: HABRAM CALER MRN: [id number] DOB: February 15, 1941  Annita Brod Downs is a 82 y.o. year old male who sees Burns, Bobette Mo, MD for primary care. I spoke with  [name] by phone today.  What matters to the patients health and wellness today?  Mr. Sobczak reports primary concern today is dizziness. He reports he notices it more when he stands, looks down and raises his head back up. He reports he tried dramamine as recommended by provider, but made him too drowsy.  RNCM to called provider office to schedule an appointment to evaluate dizziness(per review of chart recommended by PCP if medication did not work).  Mr. Muskett request he would like to see Dr. Lawerance Bach or Dr. Jonny Ruiz if dr. Lawerance Bach is not available. He denies any pain at this time. Nephrostomy tube removed 05/26/22. He continues to follow up with [facility] urology.  Goals Addressed             This Visit's Progress    continue to improve post hospitalization       Interventions Today    Flowsheet Row Most Recent Value  Chronic Disease   Chronic disease during today's visit Other  [post hospitalization sepsis, pyelonephritis (04/19/22-04/22/22)]  General Interventions   General Interventions Discussed/Reviewed General Interventions Reviewed, Doctor Visits, Communication with  Doctor Visits Discussed/Reviewed PCP  Communication with PCP/Specialists  [facility] called PCP office to schedule appointment. scheduled visit  for 06/16/22. RNCM called and notified patient. he was also presented with other choices for appointment date time and prefers to see Dr. Burns.]  Education Interventions   Education Provided Provided Education  Provided Engineer, petroleum On Other, When to see the doctor  [fall prevention strategies, advised see PCP regarding dizziness, discussed monitoring blood pressure for changes in pressure when standing.]  Safety Interventions   Safety Discussed/Reviewed Fall Risk  [discussed fall  prevention strategies: Patient encouraged to change postition slowly, keep walkways clear, good lighting, use assistive device if needed]           SDOH assessments and interventions completed:  No  Care Coordination Interventions:  Yes, provided   Follow up plan: Follow up call scheduled for 06/26/22    Encounter Outcome:  Pt. Visit Completed   Kathyrn Sheriff, RN, MSN, BSN, CCM [facility] Care Coordinator [phone]

## 2022-06-14 ENCOUNTER — Other Ambulatory Visit: Payer: Self-pay | Admitting: Physician Assistant

## 2022-06-15 ENCOUNTER — Encounter: Payer: Self-pay | Admitting: Internal Medicine

## 2022-06-15 NOTE — Progress Notes (Unsigned)
    Subjective:    Patient ID: Adrian Rios, male    DOB: 21-Jan-1941, 82 y.o.   MRN: [id number]      HPI Adrian Rios is here for No chief complaint on file.    Dizziness -     Medications and allergies reviewed with patient and updated if appropriate.  Current Outpatient Medications on File Prior to Visit  Medication Sig Dispense Refill   acetaminophen (TYLENOL) 500 MG tablet Take 1,000 mg by mouth as needed for headache (pain).     ADVAIR DISKUS 250-50 MCG/ACT AEPB INHALE 1 PUFF INTO THE LUNGS EVERY 12 HOURS (Patient taking differently: Inhale 1 puff into the lungs daily.) 60 each 2   albuterol (VENTOLIN HFA) 108 (90 Base) MCG/ACT inhaler Inhale 2 puffs into the lungs every 6 (six) hours as needed for wheezing or shortness of breath. (Patient taking differently: Inhale 1-2 puffs into the lungs every 6 (six) hours as needed for wheezing or shortness of breath.) 1 each 8   Ascorbic Acid (VITAMIN C) 1000 MG tablet Take 1,000 mg by mouth daily.     benzonatate (TESSALON) 100 MG capsule Take 1 capsule (100 mg total) by mouth 3 (three) times daily as needed for cough. 20 capsule 0   cholecalciferol (VITAMIN D3) 25 MCG (1000 UNIT) tablet Take 1,000 Units by mouth daily.     Cinnamon 500 MG capsule Take 500 mg by mouth daily.     latanoprost (XALATAN) 0.005 % ophthalmic solution Place 1 drop into the right eye at bedtime.     magnesium oxide (MAG-OX) 400 MG tablet Take 400 mg by mouth daily with supper.     methenamine (HIPREX) 1 g tablet Take 1 tablet (1 g total) by mouth 2 (two) times daily with a meal.     Misc Natural Products (GLUCOSAMINE CHOND DOUBLE STR PO) Take 1 tablet by mouth 2 (two) times daily.     montelukast (SINGULAIR) 10 MG tablet TAKE ONE TABLET BY MOUTH EVERY NIGHT AT BEDTIME 90 tablet 2   nystatin (MYCOSTATIN) 100000 UNIT/ML suspension Take 5 mLs (500,000 Units total) by mouth 4 (four) times daily. Use for 10 days 200 mL 0   omeprazole (PRILOSEC) 40 MG capsule TAKE 1 CAPSULE  BY MOUTH DAILY BEFORE BREAKFAST 90 capsule 1   triamcinolone (NASACORT) 55 MCG/ACT AERO nasal inhaler Place 2 sprays into the nose at bedtime as needed (allergies).     venlafaxine (EFFEXOR) 75 MG tablet TAKE 1 AND 1/2 TABLET BY MOUTH DAILY 135 tablet 1   No current facility-administered medications on file prior to visit.    Review of Systems     Objective:  There were no vitals filed for this visit. BP Readings from Last 3 Encounters:  04/29/22 112/78  04/22/22 114/67  12/15/21 122/82   Wt Readings from Last 3 Encounters:  04/29/22 169 lb (76.7 kg)  04/22/22 173 lb (78.5 kg)  12/15/21 181 lb (82.1 kg)   There is no height or weight on file to calculate BMI.    Physical Exam         Assessment & Plan:    See Problem List for Assessment and Plan of chronic medical problems.

## 2022-06-15 NOTE — Patient Instructions (Signed)
      Blood work was ordered.   The lab is on the first floor.    Medications changes include :       A referral was ordered for XXX.     Someone will call you to schedule an appointment.    No follow-ups on file.  

## 2022-06-16 ENCOUNTER — Ambulatory Visit (INDEPENDENT_AMBULATORY_CARE_PROVIDER_SITE_OTHER): Payer: Medicare Other | Admitting: Internal Medicine

## 2022-06-16 VITALS — BP 102/60 | HR 82 | Temp 98.0°F | Ht 67.0 in | Wt 165.0 lb

## 2022-06-16 DIAGNOSIS — R42 Dizziness and giddiness: Secondary | ICD-10-CM

## 2022-06-16 MED ORDER — MECLIZINE HCL 12.5 MG PO TABS: 12.5000 mg | ORAL_TABLET | Freq: Three times a day (TID) | ORAL | 0 refills | Status: AC | PRN

## 2022-06-16 NOTE — Assessment & Plan Note (Signed)
Acute Started 10 days ago - getting a little better Worse with bending over, change in positions No concerning neurological symptoms, cold symptoms, ear symptoms or fever Likely BPPV His mother and brother have dizziness and has had similar symptoms Start meclizine 12.5-25 mg 3 times a day as needed.  Discussed side effects, especially drowsiness, dry mouth If above not effective or causes too much drowsiness  can try valium

## 2022-06-26 ENCOUNTER — Ambulatory Visit: Payer: Self-pay

## 2022-06-26 NOTE — Patient Outreach (Signed)
  Care Coordination   Follow Up Visit Note   06/26/2022 Name: RITIK STAVOLA MRN: [id number] DOB: 04-09-1940  Annita Brod Papillion is a 82 y.o. year old male who sees Burns, Bobette Mo, MD for primary care. I spoke with  [name] by phone today.  What matters to the patients health and wellness today?  Mr. Cheema states "I'm doing good". Office visit completed with complaint of dizziness on 06/16/22. Mr. Mizuno reports he is no longer having any dizziness. He denies any care coordination, disease management or resource needs at this time.   Goals Addressed             This Visit's Progress    COMPLETED: continue to improve post hospitalization       Interventions Today    Flowsheet Row Most Recent Value  Chronic Disease   Chronic disease during today's visit Other  [post hospitalization sepsis/pyelonephritis (04/19/22-04/22/22)]  General Interventions   General Interventions Discussed/Reviewed General Interventions Reviewed  [encouraged patient to [facility] if care coordination needs change. confirmed patient has contact number.]  Doctor Visits Discussed/Reviewed Doctor Visits Reviewed, PCP  PCP/Specialist Visits Compliance with follow-up visit  [patient completed follow up appointment with PCP regarding dizziness. Patient denies any dizziness at this time.]  Education Interventions   Education Provided Provided Education  Provided Verbal Education On Other  [reviewed instructions from PCP office visit completed on 06/16/22]           SDOH assessments and interventions completed:  No  Care Coordination Interventions:  Yes, provided   Follow up plan: No further intervention required.   Encounter Outcome:  Pt. Visit Completed   Kathyrn Sheriff, RN, MSN, BSN, CCM [facility] Care Coordinator [phone]

## 2022-06-26 NOTE — Patient Instructions (Signed)
Visit Information  Thank you for taking time to visit with me today. Please don't hesitate to contact me if I can be of assistance to you.   Following are the goals we discussed today:  Continue to take medications as prescribed Continue to attend provider visits as recommended Continue to contact provider if any health questions or concerns as needed Contact RNCM if care coordination needs in the future  If you are experiencing a Mental Health or Behavioral Health Crisis or need someone to talk to, please call the Suicide and Crisis Lifeline: 41  Kathyrn Sheriff, RN, MSN, BSN, CCM [facility] Care Coordinator [phone]

## 2022-07-09 ENCOUNTER — Encounter: Payer: Self-pay | Admitting: Internal Medicine

## 2022-07-15 ENCOUNTER — Other Ambulatory Visit: Payer: Self-pay | Admitting: Internal Medicine

## 2022-07-22 ENCOUNTER — Other Ambulatory Visit: Payer: Self-pay | Admitting: Internal Medicine

## 2022-09-08 DIAGNOSIS — N200 Calculus of kidney: Secondary | ICD-10-CM | POA: Diagnosis not present

## 2022-09-08 DIAGNOSIS — N390 Urinary tract infection, site not specified: Secondary | ICD-10-CM | POA: Diagnosis not present

## 2022-09-08 DIAGNOSIS — N202 Calculus of kidney with calculus of ureter: Secondary | ICD-10-CM | POA: Diagnosis not present

## 2022-09-08 DIAGNOSIS — N2889 Other specified disorders of kidney and ureter: Secondary | ICD-10-CM | POA: Diagnosis not present

## 2022-09-08 DIAGNOSIS — N281 Cyst of kidney, acquired: Secondary | ICD-10-CM | POA: Diagnosis not present

## 2022-09-10 ENCOUNTER — Other Ambulatory Visit: Payer: Self-pay | Admitting: Internal Medicine

## 2022-10-07 DIAGNOSIS — L738 Other specified follicular disorders: Secondary | ICD-10-CM | POA: Diagnosis not present

## 2022-10-07 DIAGNOSIS — L853 Xerosis cutis: Secondary | ICD-10-CM | POA: Diagnosis not present

## 2022-10-07 DIAGNOSIS — L570 Actinic keratosis: Secondary | ICD-10-CM | POA: Diagnosis not present

## 2022-10-07 DIAGNOSIS — L304 Erythema intertrigo: Secondary | ICD-10-CM | POA: Diagnosis not present

## 2022-10-07 DIAGNOSIS — X32XXXS Exposure to sunlight, sequela: Secondary | ICD-10-CM | POA: Diagnosis not present

## 2022-10-07 DIAGNOSIS — L821 Other seborrheic keratosis: Secondary | ICD-10-CM | POA: Diagnosis not present

## 2022-10-07 DIAGNOSIS — D225 Melanocytic nevi of trunk: Secondary | ICD-10-CM | POA: Diagnosis not present

## 2022-10-07 DIAGNOSIS — Z129 Encounter for screening for malignant neoplasm, site unspecified: Secondary | ICD-10-CM | POA: Diagnosis not present

## 2022-10-07 DIAGNOSIS — L814 Other melanin hyperpigmentation: Secondary | ICD-10-CM | POA: Diagnosis not present

## 2022-10-12 ENCOUNTER — Other Ambulatory Visit: Payer: Self-pay | Admitting: Internal Medicine

## 2022-10-15 ENCOUNTER — Encounter (INDEPENDENT_AMBULATORY_CARE_PROVIDER_SITE_OTHER): Payer: Self-pay

## 2022-11-02 ENCOUNTER — Emergency Department (HOSPITAL_BASED_OUTPATIENT_CLINIC_OR_DEPARTMENT_OTHER): Payer: Medicare Other

## 2022-11-02 ENCOUNTER — Encounter (HOSPITAL_BASED_OUTPATIENT_CLINIC_OR_DEPARTMENT_OTHER): Payer: Self-pay

## 2022-11-02 ENCOUNTER — Other Ambulatory Visit: Payer: Self-pay

## 2022-11-02 ENCOUNTER — Emergency Department (HOSPITAL_BASED_OUTPATIENT_CLINIC_OR_DEPARTMENT_OTHER)
Admission: EM | Admit: 2022-11-02 | Discharge: 2022-11-02 | Disposition: A | Discharge status: Home / Self Care | Payer: Medicare Other | Attending: Emergency Medicine | Admitting: Emergency Medicine

## 2022-11-02 DIAGNOSIS — S2231XA Fracture of one rib, right side, initial encounter for closed fracture: Secondary | ICD-10-CM | POA: Insufficient documentation

## 2022-11-02 DIAGNOSIS — J9811 Atelectasis: Secondary | ICD-10-CM | POA: Diagnosis not present

## 2022-11-02 DIAGNOSIS — W010XXA Fall on same level from slipping, tripping and stumbling without subsequent striking against object, initial encounter: Secondary | ICD-10-CM | POA: Diagnosis not present

## 2022-11-02 DIAGNOSIS — Y9301 Activity, walking, marching and hiking: Secondary | ICD-10-CM | POA: Diagnosis not present

## 2022-11-02 DIAGNOSIS — S51811A Laceration without foreign body of right forearm, initial encounter: Secondary | ICD-10-CM | POA: Insufficient documentation

## 2022-11-02 DIAGNOSIS — R0789 Other chest pain: Secondary | ICD-10-CM | POA: Diagnosis present

## 2022-11-02 MED ORDER — HYDROCODONE-ACETAMINOPHEN 5-325 MG PO TABS: 2.0000 | ORAL_TABLET | Freq: Four times a day (QID) | ORAL | 0 refills | Status: DC | PRN

## 2022-11-02 NOTE — ED Provider Notes (Signed)
[name] Provider Note   CSN: [id number] Arrival date & time: 11/02/22  1453     History  Chief Complaint  Patient presents with   Adrian Rios is a 82 y.o. male.   Fall  82 year old male history of recurrent UTI, GERD, sarcoidosis presenting for fall.  Patient was working in his crawl space when he walked and tripped over a paint can.  He hit the right side of his chest and developed some chest wall pain here.  He did not hit his head or lose consciousness.  He has no headache, vomiting, neck pain.  He is not on anticoagulation.  He has a small skin tear to his right forearm, no numbness or tingling or pain here.  No frank chest pain, back pain, abdominal pain.  Some slight pain over his right lateral ribs with deep inspiration.  States last tetanus shot within the last 2 to 3 years.     Home Medications Prior to Admission medications   Medication Sig Start Date End Date Taking? Authorizing Provider  HYDROcodone-acetaminophen (NORCO/VICODIN) 5-325 MG tablet Take 2 tablets by mouth every 6 (six) hours as needed. 11/02/22  Yes Laurence Spates, MD  acetaminophen (TYLENOL) 500 MG tablet Take 1,000 mg by mouth as needed for headache (pain).    [provider]  albuterol (VENTOLIN HFA) 108 (90 Base) MCG/ACT inhaler Inhale 2 puffs into the lungs every 6 (six) hours as needed for wheezing or shortness of breath. Patient taking differently: Inhale 1-2 puffs into the lungs every 6 (six) hours as needed for wheezing or shortness of breath. 02/01/20   Pincus Sanes, MD  Ascorbic Acid (VITAMIN C) 1000 MG tablet Take 1,000 mg by mouth daily.    [provider]  cholecalciferol (VITAMIN D3) 25 MCG (1000 UNIT) tablet Take 1,000 Units by mouth daily.    [provider]  Cinnamon 500 MG capsule Take 500 mg by mouth daily.    [provider]  fluticasone-salmeterol (ADVAIR DISKUS) 250-50 MCG/ACT AEPB INHALE 1  PUFF INTO THE LUNGS EVERY 12 HOURS 07/15/22   Burns, Bobette Mo, MD  latanoprost (XALATAN) 0.005 % ophthalmic solution Place 1 drop into the right eye at bedtime. 05/06/20   [provider]  magnesium oxide (MAG-OX) 400 MG tablet Take 400 mg by mouth daily with supper.    [provider]  meclizine (ANTIVERT) 12.5 MG tablet Take 1-2 tablets (12.5-25 mg total) by mouth 3 (three) times daily as needed for dizziness. 06/16/22   Pincus Sanes, MD  methenamine (HIPREX) 1 g tablet Take 1 tablet (1 g total) by mouth 2 (two) times daily with a meal. 06/21/21   Gonfa, Boyce Medici, MD  Misc Natural Products (GLUCOSAMINE CHOND DOUBLE STR PO) Take 1 tablet by mouth 2 (two) times daily.    [provider]  montelukast (SINGULAIR) 10 MG tablet TAKE 1 TABLET BY MOUTH EVERY NIGHT AT BEDTIME - NEED APPOINTMENT 10/12/22   Pincus Sanes, MD  triamcinolone (NASACORT) 55 MCG/ACT AERO nasal inhaler Place 2 sprays into the nose at bedtime as needed (allergies).    [provider]  venlafaxine (EFFEXOR) 75 MG tablet TAKE 1 AND 1/2 TABLET BY MOUTH DAILY 07/22/22   Pincus Sanes, MD      Allergies    Ivp dye [iodinated contrast media], Metrizamide, Shellfish allergy, Gabapentin, Niacin-lovastatin er, Gadolinium, and Adhesive [tape]    Review of Systems  Review of Systems Review of systems completed and notable as per HPI.  ROS otherwise negative.   Physical Exam Updated Vital Signs BP 108/75 (BP Location: Left Arm)   Pulse 92   Temp 97.8 F (36.6 C)   Resp 18   Ht 5\' 7"  (1.702 m)   Wt 74.8 kg   SpO2 96%   BMI 25.84 kg/m  Physical Exam Vitals and nursing note reviewed.  Constitutional:      General: He is not in acute distress.    Appearance: He is well-developed.  HENT:     Head: Normocephalic and atraumatic.     Mouth/Throat:     Mouth: Mucous membranes are moist.     Pharynx: Oropharynx is clear.  Eyes:     Extraocular Movements: Extraocular movements intact.      Conjunctiva/sclera: Conjunctivae normal.     Pupils: Pupils are equal, round, and reactive to light.  Cardiovascular:     Rate and Rhythm: Normal rate and regular rhythm.     Heart sounds: No murmur heard. Pulmonary:     Effort: Pulmonary effort is normal. No respiratory distress.     Breath sounds: Normal breath sounds.  Abdominal:     Palpations: Abdomen is soft.     Tenderness: There is no abdominal tenderness. There is no guarding or rebound.     Comments: No tenderness even with deep palpation  Musculoskeletal:        General: No swelling or tenderness.     Cervical back: Normal range of motion and neck supple. No rigidity or tenderness.     Right lower leg: No edema.     Left lower leg: No edema.     Comments: Small skin tear to the medial right forearm.  Hemostatic.  2+ radial pulse.  No tenderness or pain.  Full range of motion and strength of the hand.  Normal sensation.  Some bruising over the right lateral chest wall with mild tenderness.  No palpable deformity.  Skin:    General: Skin is warm and dry.     Capillary Refill: Capillary refill takes less than 2 seconds.  Neurological:     General: No focal deficit present.     Mental Status: He is alert and oriented to person, place, and time. Mental status is at baseline.     Cranial Nerves: No cranial nerve deficit.     Sensory: No sensory deficit.     Motor: No weakness.     Coordination: Coordination normal.     Gait: Gait normal.  Psychiatric:        Mood and Affect: Mood normal.     ED Results / Procedures / Treatments   Labs (all labs ordered are listed, but only abnormal results are displayed) Labs Reviewed - No data to display  EKG None  Radiology DG Ribs Unilateral W/Chest Right  Result Date: 11/02/2022 CLINICAL DATA:  Fall today.  Right chest pain. EXAM: RIGHT RIBS AND CHEST - 3+ VIEW COMPARISON:  Chest radiographs 04/19/2022.  Chest CT 10/25/2016. FINDINGS: The heart size and mediastinal contours are  stable. The aeration of both lung bases has improved from the prior study with mild residual bibasilar atelectasis. No evidence of pneumothorax or significant pleural effusion. There is an acute fracture of the right 6th rib laterally which appears nondisplaced. No displaced rib fractures are identified. There are degenerative changes in the spine. Previous left shoulder reverse arthroplasty. IMPRESSION: Acute nondisplaced fracture of the right 6th rib laterally. No  evidence of pneumothorax or significant pleural effusion. Electronically Signed   By: Carey Bullocks M.D.   On: 11/02/2022 16:37    Procedures Procedures    Medications Ordered in ED Medications - No data to display  ED Course/ Medical Decision Making/ A&P Clinical Course as of 11/02/22 1819  Mon Nov 02, 2022  1711 IS > 2000 [JD]    Clinical Course User Index [JD] Laurence Spates, MD                                 Medical Decision Making Amount and/or Complexity of Data Reviewed Radiology: ordered.  Risk Prescription drug management.   Medical Decision Making:   ROLANDO ABLER is a 82 y.o. male who presented to the ED today with mechanical fall and right rib pain.  Vital signs reviewed.  Exam he is well-appearing.  He reports mechanical fall where he tripped and hit the right side of his chest.  Some old bruising here, but normal lung sounds and normal work of breathing.  I reviewed his chest x-ray, no pneumonia, pneumothorax, hemothorax.  He does have nondisplaced sixth rib fracture here, no other obvious rib fractures and this is consistent with his area of pain.  His pain is well-controlled even without medication here and his incentive spirometry is over 2000, given this I do not think he needs additional imaging.  His abdominal exam is benign even with deep tenderness throughout the abdomen and in the right upper quadrant with normal vital signs, I do not think he needs abdominal imaging at this time.  He is no signs  of head or neck injury.  Does have a small skin tear, will dress.  He has follow-up already with his PCP in a couple days.  Given a short prescription for hydrocodone, did discuss strict precautions with this given potential increased fall risk including no driving while taking it.  He was comfortable this plan.  Strict return precautions given.   Patient placed on continuous vitals and telemetry monitoring while in ED which was reviewed periodically.  Reviewed and confirmed nursing documentation for past medical history, family history, social history.   Patient's presentation is most consistent with acute complicated illness / injury requiring diagnostic workup.           Final Clinical Impression(s) / ED Diagnoses Final diagnoses:  Closed fracture of one rib of right side, initial encounter    Rx / DC Orders ED Discharge Orders          Ordered    HYDROcodone-acetaminophen (NORCO/VICODIN) 5-325 MG tablet  Every 6 hours PRN        11/02/22 1659              Laurence Spates, MD 11/02/22 1820

## 2022-11-02 NOTE — ED Triage Notes (Addendum)
Mechanical fall "a couple hours ago". No thinners. R sided rib and arm pain. No LOC, did not hit head. Skin tear to R forearm.

## 2022-11-02 NOTE — Discharge Instructions (Signed)
You have a rib fracture on the right side.  You can take the prescribed pain medication but should not drive or do anything dangerous while taking as it may make you drowsy or increased risk for fall.  You should follow-up with your doctor later this week.  If you develop difficulty breathing, fever, severe pain you should return to the ED.

## 2022-11-02 NOTE — ED Notes (Signed)
Pts skin tear cleaned with wound cleanser, dressed with mepitel one, tegaderm. Wound care education provided.

## 2022-11-02 NOTE — ED Notes (Signed)
D/c paperwork reviewed with pt, including prescriptions and follow up care.  All questions and/or concerns addressed at time of d/c.  No further needs expressed. . Pt verbalized understanding, Ambulatory without assistance to ED exit, NAD.   

## 2022-11-03 ENCOUNTER — Telehealth (HOSPITAL_BASED_OUTPATIENT_CLINIC_OR_DEPARTMENT_OTHER): Payer: Self-pay | Admitting: Emergency Medicine

## 2022-11-03 MED ORDER — HYDROCODONE-ACETAMINOPHEN 5-325 MG PO TABS
2.0000 | ORAL_TABLET | Freq: Four times a day (QID) | ORAL | 0 refills | Status: AC | PRN
Start: 2022-11-03 — End: ?

## 2022-11-03 NOTE — Telephone Encounter (Signed)
[facility] called and said they do not have the Norco at their pharmacy but do have it in another location.  Will send it to the other location.  The patient has been notified by the [facility] pharmacist.  They will also cancel his current prescription.

## 2022-11-04 NOTE — Progress Notes (Unsigned)
Subjective:    Patient ID: Adrian Rios, male    DOB: June 21, 1940, 82 y.o.   MRN: [id number]     HPI Adrian Rios is here for follow up of his chronic medical problems.  9/2 - fell - broke one right rib-was seen in the emergency room and was given pain medication.  Has 2 pills left.  Still and a significant amount of pain.    Other medications include working well  Medications and allergies reviewed with patient and updated if appropriate.  Current Outpatient Medications on File Prior to Visit  Medication Sig Dispense Refill   acetaminophen (TYLENOL) 500 MG tablet Take 1,000 mg by mouth as needed for headache (pain).     albuterol (VENTOLIN HFA) 108 (90 Base) MCG/ACT inhaler Inhale 2 puffs into the lungs every 6 (six) hours as needed for wheezing or shortness of breath. (Patient taking differently: Inhale 1-2 puffs into the lungs every 6 (six) hours as needed for wheezing or shortness of breath.) 1 each 8   Ascorbic Acid (VITAMIN C) 1000 MG tablet Take 1,000 mg by mouth daily.     cholecalciferol (VITAMIN D3) 25 MCG (1000 UNIT) tablet Take 1,000 Units by mouth daily.     Cinnamon 500 MG capsule Take 500 mg by mouth daily.     fluticasone-salmeterol (ADVAIR DISKUS) 250-50 MCG/ACT AEPB INHALE 1 PUFF INTO THE LUNGS EVERY 12 HOURS 60 each 2   HYDROcodone-acetaminophen (NORCO/VICODIN) 5-325 MG tablet Take 2 tablets by mouth every 6 (six) hours as needed. 10 tablet 0   latanoprost (XALATAN) 0.005 % ophthalmic solution Place 1 drop into the right eye at bedtime.     magnesium oxide (MAG-OX) 400 MG tablet Take 400 mg by mouth daily with supper.     meclizine (ANTIVERT) 12.5 MG tablet Take 1-2 tablets (12.5-25 mg total) by mouth 3 (three) times daily as needed for dizziness. 30 tablet 0   methenamine (HIPREX) 1 g tablet Take 1 tablet (1 g total) by mouth 2 (two) times daily with a meal.     Misc Natural Products (GLUCOSAMINE CHOND DOUBLE STR PO) Take 1 tablet by mouth 2 (two) times daily.      montelukast (SINGULAIR) 10 MG tablet TAKE 1 TABLET BY MOUTH EVERY NIGHT AT BEDTIME - NEED APPOINTMENT 30 tablet 0   triamcinolone (NASACORT) 55 MCG/ACT AERO nasal inhaler Place 2 sprays into the nose at bedtime as needed (allergies).     No current facility-administered medications on file prior to visit.     Review of Systems  Constitutional:  Negative for chills and fever.  Respiratory:  Negative for cough, shortness of breath and wheezing.   Cardiovascular:  Negative for chest pain, palpitations and leg swelling.  Neurological:  Positive for headaches (frequent - chronic). Negative for light-headedness.       Objective:   Vitals:   11/05/22 0854  BP: 132/80  Pulse: 62  Temp: 98.2 F (36.8 C)  SpO2: 95%   BP Readings from Last 3 Encounters:  11/05/22 132/80  11/02/22 123/67  06/16/22 102/60   Wt Readings from Last 3 Encounters:  11/05/22 173 lb (78.5 kg)  11/02/22 165 lb (74.8 kg)  06/16/22 165 lb (74.8 kg)   Body mass index is 27.1 kg/m.    Physical Exam Constitutional:      General: He is not in acute distress.    Appearance: Normal appearance. He is not ill-appearing.  HENT:     Head: Normocephalic and atraumatic.  Eyes:     Conjunctiva/sclera: Conjunctivae normal.  Cardiovascular:     Rate and Rhythm: Normal rate and regular rhythm.     Heart sounds: Normal heart sounds.  Pulmonary:     Effort: Pulmonary effort is normal. No respiratory distress.     Breath sounds: Normal breath sounds. No wheezing or rales.  Chest:     Chest wall: Tenderness (Right lateral rib cage where he has the right lateral sixth rib broken) present.  Musculoskeletal:     Right lower leg: No edema.     Left lower leg: No edema.  Skin:    General: Skin is warm and dry.     Findings: Bruising (Right lateral rib cage) and lesion (Skin tear right forearm-bandage in place-no evidence of infection) present. No rash.  Neurological:     Mental Status: He is alert. Mental status is at  baseline.  Psychiatric:        Mood and Affect: Mood normal.        Lab Results  Component Value Date   WBC 10.0 04/22/2022   HGB 11.1 (L) 04/22/2022   HCT 33.8 (L) 04/22/2022   PLT 246 04/22/2022   GLUCOSE 110 (H) 04/22/2022   CHOL 146 08/02/2020   TRIG 130.0 08/02/2020   HDL 41.50 08/02/2020   LDLCALC 79 08/02/2020   ALT 27 04/20/2022   AST 26 04/20/2022   NA 138 04/22/2022   K 3.6 04/22/2022   CL 107 04/22/2022   CREATININE 1.06 04/22/2022   BUN 14 04/22/2022   CO2 23 04/22/2022   TSH 1.17 10/28/2018   PSA 0.29 07/11/2009   INR 1.1 04/19/2022   HGBA1C 5.7 06/25/2021   MICROALBUR 0.3 06/29/2006   DG Ribs Unilateral W/Chest Right CLINICAL DATA:  Fall today.  Right chest pain.  EXAM: RIGHT RIBS AND CHEST - 3+ VIEW  COMPARISON:  Chest radiographs 04/19/2022.  Chest CT 10/25/2016.  FINDINGS: The heart size and mediastinal contours are stable. The aeration of both lung bases has improved from the prior study with mild residual bibasilar atelectasis. No evidence of pneumothorax or significant pleural effusion. There is an acute fracture of the right 6th rib laterally which appears nondisplaced. No displaced rib fractures are identified. There are degenerative changes in the spine. Previous left shoulder reverse arthroplasty.  IMPRESSION: Acute nondisplaced fracture of the right 6th rib laterally. No evidence of pneumothorax or significant pleural effusion.  Electronically Signed   By: Carey Bullocks M.D.   On: 11/02/2022 16:37    Assessment & Plan:    See Problem List for Assessment and Plan of chronic medical problems.

## 2022-11-04 NOTE — Patient Instructions (Addendum)
      Blood work was ordered.   The lab is on the first floor.    Medications changes include :       A referral was ordered and someone will call you to schedule an appointment.     Return in about 1 year (around 11/05/2023) for follow up.

## 2022-11-05 ENCOUNTER — Encounter: Payer: Self-pay | Admitting: Internal Medicine

## 2022-11-05 ENCOUNTER — Ambulatory Visit (INDEPENDENT_AMBULATORY_CARE_PROVIDER_SITE_OTHER): Payer: Medicare Other | Admitting: Internal Medicine

## 2022-11-05 VITALS — BP 132/80 | HR 62 | Temp 98.2°F | Ht 67.0 in | Wt 173.0 lb

## 2022-11-05 DIAGNOSIS — F3289 Other specified depressive episodes: Secondary | ICD-10-CM

## 2022-11-05 DIAGNOSIS — R42 Dizziness and giddiness: Secondary | ICD-10-CM

## 2022-11-05 DIAGNOSIS — S2231XA Fracture of one rib, right side, initial encounter for closed fracture: Secondary | ICD-10-CM | POA: Diagnosis not present

## 2022-11-05 DIAGNOSIS — J4520 Mild intermittent asthma, uncomplicated: Secondary | ICD-10-CM

## 2022-11-05 DIAGNOSIS — J309 Allergic rhinitis, unspecified: Secondary | ICD-10-CM

## 2022-11-05 DIAGNOSIS — R7303 Prediabetes: Secondary | ICD-10-CM | POA: Diagnosis not present

## 2022-11-05 MED ORDER — VENLAFAXINE HCL 75 MG PO TABS: 112.5000 mg | ORAL_TABLET | Freq: Every day | ORAL | 2 refills | Status: DC

## 2022-11-05 MED ORDER — HYDROCODONE-ACETAMINOPHEN 5-325 MG PO TABS: 2.0000 | ORAL_TABLET | Freq: Four times a day (QID) | ORAL | 0 refills | Status: DC | PRN

## 2022-11-05 MED ORDER — MONTELUKAST SODIUM 10 MG PO TABS: 10.0000 mg | ORAL_TABLET | Freq: Every day | ORAL | 3 refills | Status: DC

## 2022-11-05 NOTE — Assessment & Plan Note (Signed)
Chronic Lab Results  Component Value Date   HGBA1C 5.7 06/25/2021   Low sugar/carbohydrate diet

## 2022-11-05 NOTE — Assessment & Plan Note (Addendum)
Chronic Controlled Continue Singulair daily, Nasacort daily as needed

## 2022-11-05 NOTE — Assessment & Plan Note (Signed)
Acute Fall 9/2 which resulted in right sixth lateral rib fracture-seen in ED and had x-ray there Taking hydrocodone-acetaminophen as needed for pain which is helping Pain is still severe at times Discussed increased risk of pneumonia Will renew pain medication-discussed possible side effects Discussed that he hopefully will be able to slowly wean off of this over the next couple of weeks and replace it with Tylenol

## 2022-11-05 NOTE — Assessment & Plan Note (Signed)
Chronic Controlled Continue Advair 250-50 once daily, Singulair 10 mg daily Continue albuterol as needed

## 2022-11-05 NOTE — Assessment & Plan Note (Signed)
Chronic Controlled, Stable Continue Effexor 112.5 mg daily

## 2022-11-20 ENCOUNTER — Encounter: Payer: Self-pay | Admitting: Nurse Practitioner

## 2022-11-20 ENCOUNTER — Telehealth (INDEPENDENT_AMBULATORY_CARE_PROVIDER_SITE_OTHER): Payer: Medicare Other | Admitting: Nurse Practitioner

## 2022-11-20 ENCOUNTER — Ambulatory Visit (INDEPENDENT_AMBULATORY_CARE_PROVIDER_SITE_OTHER): Payer: Medicare Other | Admitting: *Deleted

## 2022-11-20 VITALS — Ht 67.0 in | Wt 172.0 lb

## 2022-11-20 DIAGNOSIS — W57XXXA Bitten or stung by nonvenomous insect and other nonvenomous arthropods, initial encounter: Secondary | ICD-10-CM | POA: Insufficient documentation

## 2022-11-20 DIAGNOSIS — Z0000 Encounter for general adult medical examination without abnormal findings: Secondary | ICD-10-CM

## 2022-11-20 DIAGNOSIS — S70261A Insect bite (nonvenomous), right hip, initial encounter: Secondary | ICD-10-CM

## 2022-11-20 MED ORDER — DOXYCYCLINE HYCLATE 100 MG PO TABS
100.0000 mg | ORAL_TABLET | Freq: Two times a day (BID) | ORAL | 0 refills | Status: DC
Start: 2022-11-20 — End: 2023-05-04

## 2022-11-20 MED ORDER — MUPIROCIN 2 % EX OINT
1.0000 | TOPICAL_OINTMENT | Freq: Two times a day (BID) | CUTANEOUS | 0 refills | Status: DC
Start: 2022-11-20 — End: 2023-06-03

## 2022-11-20 NOTE — Progress Notes (Signed)
Established Patient Office Visit  An audio/visual tele-health visit was completed today for this patient. I connected with  Adrian Rios on 11/20/22 utilizing audio/visual technology and verified that I am speaking with the correct person using two identifiers. The patient was located at their home, and I was located at the office of [facility] at [facility] during the encounter. I discussed the limitations of evaluation and management by telemedicine. The patient expressed understanding and agreed to proceed.     Subjective   Patient ID: Adrian Rios, male    DOB: August 13, 1940  Age: 82 y.o. MRN: [id number]  Chief Complaint  Patient presents with   Insect Bite   Patient arrives today via virtual visit with the assistance of his wife.  Reports he has a wound on his right hip that he thinks is from an insect bite.  Has been there for a day or 2.  Is slightly tender to touch, swollen, and red.  He is concerned about infection.  No drainage.  No fever.  He has not identified any other rashes no joint pain.  Not sure what bit him to cause the lesion.  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 Per wife, last tetanus shot was administered 1-2 years ago at another facility when he had to get a fish hook removed from his finger.      Review of Systems  Constitutional:  Negative for chills, fever and malaise/fatigue.  Eyes:  Negative for blurred vision and double vision.  Respiratory:  Negative for shortness of breath and wheezing.   Cardiovascular:  Negative for chest pain.  Skin:  Positive for itching and rash.  Neurological:  Negative for dizziness.      Objective:     There were no vitals taken for this visit. BP Readings from Last 3 Encounters:  11/05/22 132/80  11/02/22 123/67  06/16/22 102/60   Wt Readings from Last 3 Encounters:  11/20/22 172 lb (78 kg)  11/05/22 173 lb (78.5 kg)  11/02/22 165 lb (74.8 kg)      Physical  Exam Comprehensive physical exam not completed today as office visit was conducted remotely.  Patient appears well over video, wife was able to upload a picture of the bite to his chart.  It does look possibly slightly raised, red, and swollen.  No drainage apparent on picture.  Patient was alert and oriented, and appeared to have appropriate judgment.   No results found for any visits on 11/20/22.    The ASCVD Risk score (Arnett DK, et al., 2019) failed to calculate for the following reasons:   The 2019 ASCVD risk score is only valid for ages 32 to 68    Assessment & Plan:   Problem List Items Addressed This Visit       Other   Bite, insect - Primary    Acute Due to history of antibiotic resistance recommend treating with mupirocin ointment topically for 2 to 3 days.  If symptoms persist or do not improve treat with a course of oral doxycycline x 10 days. Patient encouraged to proceed to call office for in person evaluation if symptoms persist despite above treatment plan.      Relevant Medications   doxycycline (VIBRA-TABS) 100 MG tablet   mupirocin ointment (BACTROBAN) 2 %    Return if symptoms worsen or fail to improve.    Elenore Paddy, NP

## 2022-11-20 NOTE — Progress Notes (Signed)
Subjective:   Adrian Rios is a 82 y.o. male who presents for Medicare Annual/Subsequent preventive examination.  Visit Complete: Virtual  I connected with  [name] on 11/20/22 by a audio enabled telemedicine application and verified that I am speaking with the correct person using two identifiers.  Patient Location: Home  Provider Location: Office/Clinic  I discussed the limitations of evaluation and management by telemedicine. The patient expressed understanding and agreed to proceed.  Patient Medicare AWV questionnaire was completed by the patient on 11/19/2022; I have confirmed that all information answered by patient is correct and no changes since this date.  Cardiac Risk Factors include: advanced age (>55men, >33 women);male gender    Please see problem list for additional risk factors  Vital Signs: Unable to obtain new vitals due to this being a telehealth visit.  Objective:    Today's Vitals   11/20/22 1022 11/20/22 1023  Weight: 172 lb (78 kg)   Height: 5\' 7"  (1.702 m)   PainSc:  3    Body mass index is 26.94 kg/m.     11/20/2022   10:25 AM 11/02/2022    3:26 PM 04/19/2022   12:17 AM 10/10/2021    1:00 AM 07/23/2021    6:26 PM 06/14/2021    6:10 AM 06/13/2021   11:42 PM  Advanced Directives  Does Patient Have a Medical Advance Directive? No;Yes No Yes No No Yes No  Type of Estate agent of [place];Living will  Healthcare Power of [place];Living will   Healthcare Power of [place];Living will   Does patient want to make changes to medical advance directive?   No - Patient declined   No - Patient declined   Copy of Healthcare Power of Attorney in Chart?      No - copy requested   Would patient like information on creating a medical advance directive? No - Patient declined  No - Patient declined No - Patient declined       Current Medications (verified) Outpatient Encounter Medications as of 11/20/2022  Medication Sig   acetaminophen  (TYLENOL) 500 MG tablet Take 1,000 mg by mouth as needed for headache (pain).   albuterol (VENTOLIN HFA) 108 (90 Base) MCG/ACT inhaler Inhale 2 puffs into the lungs every 6 (six) hours as needed for wheezing or shortness of breath. (Patient taking differently: Inhale 1-2 puffs into the lungs every 6 (six) hours as needed for wheezing or shortness of breath.)   Ascorbic Acid (VITAMIN C) 1000 MG tablet Take 1,000 mg by mouth daily.   cholecalciferol (VITAMIN D3) 25 MCG (1000 UNIT) tablet Take 1,000 Units by mouth daily.   Cinnamon 500 MG capsule Take 500 mg by mouth daily.   fluticasone-salmeterol (ADVAIR DISKUS) 250-50 MCG/ACT AEPB INHALE 1 PUFF INTO THE LUNGS EVERY 12 HOURS   latanoprost (XALATAN) 0.005 % ophthalmic solution Place 1 drop into the right eye at bedtime.   magnesium oxide (MAG-OX) 400 MG tablet Take 400 mg by mouth daily with supper.   meclizine (ANTIVERT) 12.5 MG tablet Take 1-2 tablets (12.5-25 mg total) by mouth 3 (three) times daily as needed for dizziness.   methenamine (HIPREX) 1 g tablet Take 1 tablet (1 g total) by mouth 2 (two) times daily with a meal.   Misc Natural Products (GLUCOSAMINE CHOND DOUBLE STR PO) Take 1 tablet by mouth 2 (two) times daily.   montelukast (SINGULAIR) 10 MG tablet Take 1 tablet (10 mg total) by mouth at bedtime.   triamcinolone (NASACORT)  55 MCG/ACT AERO nasal inhaler Place 2 sprays into the nose at bedtime as needed (allergies).   venlafaxine (EFFEXOR) 75 MG tablet Take 1.5 tablets (112.5 mg total) by mouth daily.   HYDROcodone-acetaminophen (NORCO/VICODIN) 5-325 MG tablet Take 2 tablets by mouth every 6 (six) hours as needed. (Patient not taking: Reported on 11/20/2022)   No facility-administered encounter medications on file as of 11/20/2022.    Allergies (verified) Ivp dye [iodinated contrast media], Metrizamide, Shellfish allergy, Gabapentin, Niacin-lovastatin er, Gadolinium, and Adhesive [tape]   History: Past Medical History:  Diagnosis  Date   Allergic rhinitis    Arthritis    Benign localized prostatic hyperplasia with lower urinary tract symptoms (LUTS)    Bladder tumor    Borderline glaucoma of right eye    Cancer (HCC)    bladder and prostate   Carotid bruit    per duplex 03-11-2016 RICA 1-39%   Chronic throat clearing    COVID-19    DDD (degenerative disc disease), lumbar    Depression    ED (erectile dysfunction)    Elevated PSA    urologist-  dr Patsi Sears--- s/p  prostate bx's   GERD (gastroesophageal reflux disease)    Hematuria    History of chronic bronchitis    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    s/p  MOH's nasal --  pre-melanoma   History of squamous cell carcinoma excision    several times   Hyperlipidemia    Pre-diabetes    Premature ventricular contractions (PVCs) (VPCs)    RAD (reactive airway disease)    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  s/p  deep neck lymph node bx's and lung bx's   Sensorineural hearing loss (SNHL) of both ears    Sepsis (HCC) 09/2016   Ureterolithiasis    UTI (urinary tract infection) 08/2016   Wears glasses    Wears hearing aid    bilateral   Past Surgical History:  Procedure Laterality Date   APPENDECTOMY  2008   CARDIOVASCULAR STRESS TEST  05/27/2010   normal nuclear study w/ no ischemia/  normal LV function and wall motion , ef 60%   CYSTOSCOPY WITH INJECTION N/A 06/12/2016   Procedure: CYSTOSCOPY WITH INJECTION OF INDOCYANINE GREEN DYE;  Surgeon: Sebastian Ache, MD;  Location: [facility];  Service: Urology;  Laterality: N/A;   DEEP NECK LYMPH NODE BIOPSY / EXCISION  1970's   and Bronchoscopy w/ bx's ( dx Sarcoidosis)   ILEOSTOMY     IR NEPHROSTOMY PLACEMENT LEFT  11/18/2016   IR NEPHROSTOMY PLACEMENT RIGHT  04/19/2022   MOHS SURGERY  2013 approx.    nasal; pre melanoma   PARS PLANA VITRECTOMY Right 2007   repair macular pucker   TONSILLECTOMY AND ADENOIDECTOMY  child   TRANSURETHRAL RESECTION OF BLADDER TUMOR N/A 04/06/2016    Procedure: CYSTOSCOPY TRANSURETHRAL RESECTION OF BLADDER TUMORS (TURBT);  Surgeon: Jethro Bolus, MD;  [facility];  Service: Urology;  Laterality: N/A;   TRANSURETHRAL RESECTION OF PROSTATE     Family History  Problem Relation Age of Onset   Stroke Mother        in her 25s   Breast cancer Sister    Heart disease Maternal Grandmother 43       MI    Breast cancer Paternal Grandmother    Heart disease Paternal Grandfather 25       MI   Stroke Brother    Healthy Father  Diabetes Neg Hx    Social History   Socioeconomic History   Marital status: Married    Spouse name: Not on file   Number of children: 2   Years of education: Not on file   Highest education level: 12th grade  Occupational History   Occupation: retired  Tobacco Use   Smoking status: Never   Smokeless tobacco: Never  Vaping Use   Vaping status: Never Used  Substance and Sexual Activity   Alcohol use: No   Drug use: No   Sexual activity: Not Currently  Other Topics Concern   Not on file  Social History Narrative   Not on file   Social Determinants of Health   Financial Resource Strain: Low Risk  (11/20/2022)   Overall Financial Resource Strain (CARDIA)    Difficulty of Paying Living Expenses: Not hard at all  Food Insecurity: No Food Insecurity (11/20/2022)   Hunger Vital Sign    Worried About Running Out of Food in the Last Year: Never true    Ran Out of Food in the Last Year: Never true  Transportation Needs: No Transportation Needs (11/20/2022)   PRAPARE - Administrator, Civil Service (Medical): No    Lack of Transportation (Non-Medical): No  Physical Activity: Insufficiently Active (11/20/2022)   Exercise Vital Sign    Days of Exercise per Week: 2 days    Minutes of Exercise per Session: 30 min  Stress: No Stress Concern Present (11/20/2022)   [employer] of Occupational Health - Occupational Stress Questionnaire    Feeling of Stress : Not at all   Social Connections: Socially Integrated (11/20/2022)   Social Connection and Isolation Panel [NHANES]    Frequency of Communication with Friends and Family: Three times a week    Frequency of Social Gatherings with Friends and Family: Three times a week    Attends Religious Services: More than 4 times per year    Active Member of Clubs or Organizations: Yes    Attends Engineer, structural: More than 4 times per year    Marital Status: Married    Tobacco Counseling Counseling given: Not Answered   Clinical Intake:  Pre-visit preparation completed: Yes  Pain : 0-10 Pain Score: 3  Pain Type: Acute pain Pain Location: Rib cage Pain Onset: In the past 7 days Pain Frequency: Intermittent     Nutritional Status: BMI 25 -29 Overweight Nutritional Risks: None Diabetes: No  How often do you need to have someone help you when you read instructions, pamphlets, or other written materials from your doctor or pharmacy?: 1 - Never What is the last grade level you completed in school?: 12 th grade  Interpreter Needed?: No      Activities of Daily Living    11/20/2022   10:30 AM 11/19/2022   11:33 PM  In your present state of health, do you have any difficulty performing the following activities:  Hearing? 1 1  Vision? 0 0  Difficulty concentrating or making decisions? 0 0  Walking or climbing stairs? 0 0  Dressing or bathing? 0 0  Doing errands, shopping? 0 0  Preparing Food and eating ? N N  Using the Toilet? N N  In the past six months, have you accidently leaked urine? N N  Do you have problems with loss of bowel control? N N  Managing your Medications? N N  Managing your Finances? N N  Housekeeping or managing your Housekeeping? N N  Patient Care Team: Pincus Sanes, MD as PCP - General (Internal Medicine) Venancio Poisson, MD as Consulting Physician (Dermatology) Rebeca Allegra, MD (Urology) Arville Care, MD as Referring Physician  (Ophthalmology) Crist Fat, MD as Attending Physician (Urology)  Indicate any recent Medical Services you may have received from other than [facility] providers in the past year (date may be approximate).     Assessment:   This is a routine wellness examination for Days[place].  Hearing/Vision screen Vision Screening - Comments:: Annual eye exam Dr.Flishman   Goals Addressed   None   Depression Screen    11/20/2022   10:26 AM 06/16/2022    8:49 AM 04/29/2022    3:48 PM 10/22/2021    2:51 PM 02/13/2021   10:30 AM 11/01/2020    4:10 PM 07/03/2020    1:59 PM  PHQ 2/9 Scores  PHQ - 2 Score 0 0 0 0 0 0 0  PHQ- 9 Score  0 0 0       Fall Risk    11/20/2022   10:23 AM 11/19/2022   11:33 PM 06/16/2022    8:49 AM 04/29/2022    3:47 PM 11/11/2021    2:07 PM  Fall Risk   Falls in the past year? 1 1 0 0 1  Number falls in past yr: 0 0 0 0 0  Injury with Fall? 1 1 0 0 0  Risk for fall due to : Impaired balance/gait  No Fall Risks No Fall Risks No Fall Risks  Follow up Falls evaluation completed  Falls evaluation completed Falls evaluation completed Falls evaluation completed    MEDICARE RISK AT HOME: Medicare Risk at Home Any stairs in or around the home?: Yes If so, are there any without handrails?: No Home free of loose throw rugs in walkways, pet beds, electrical cords, etc?: Yes Adequate lighting in your home to reduce risk of falls?: Yes Life alert?: No Use of a cane, walker or w/c?: No Grab bars in the bathroom?: Yes Shower chair or bench in shower?: No Elevated toilet seat or a handicapped toilet?: No  TIMED UP AND GO:  Was the test performed?  No    Cognitive Function:    05/26/2017   10:45 AM  MMSE - Mini Mental State Exam  Orientation to time 5  Orientation to Place 5  Registration 3  Attention/ Calculation 5  Recall 1  Language- name 2 objects 2  Language- repeat 1  Language- follow 3 step command 3  Language- read & follow direction 1  Write a sentence 1  Copy  design 1  Total score 28        11/20/2022   10:26 AM  6CIT Screen  What Year? 0 points  What month? 0 points  What time? 0 points  Count back from 20 0 points  Months in reverse 0 points  Repeat phrase 0 points  Total Score 0 points    Immunizations Immunization History  Administered Date(s) Administered   Fluad Quad(high Dose 65+) 10/28/2018   H1N1 04/12/2008   Influenza Split 12/16/2010, 01/06/2012   Influenza Whole 02/03/2007, 12/21/2007, 12/05/2009   Influenza, High Dose Seasonal PF 12/20/2012, 12/02/2015, 11/11/2017   Influenza,inj,Quad PF,6+ Mos 12/07/2013   Influenza-Unspecified 12/07/2013, 11/26/2017, 12/11/2020   PFIZER(Purple Top)SARS-COV-2 Vaccination 05/01/2019, 05/25/2019, 06/01/2019   Pneumococcal Conjugate-13 08/20/2015   Pneumococcal Polysaccharide-23 03/09/2002, 12/28/2012   Tetanus 02/22/2011   Varicella 07/21/2005   Zoster, Live 04/25/2006    TDAP status: Due, Education has  been provided regarding the importance of this vaccine. Advised may receive this vaccine at local pharmacy or Health Dept. Aware to provide a copy of the vaccination record if obtained from local pharmacy or Health Dept. Verbalized acceptance and understanding.  Flu Vaccine status: Due, Education has been provided regarding the importance of this vaccine. Advised may receive this vaccine at local pharmacy or Health Dept. Aware to provide a copy of the vaccination record if obtained from local pharmacy or Health Dept. Verbalized acceptance and understanding.  Pneumococcal vaccine status: Up to date  Covid-19 vaccine status: Completed vaccines  Qualifies for Shingles Vaccine? Yes   Zostavax completed No   Shingrix Completed?: No.    Education has been provided regarding the importance of this vaccine. Patient has been advised to call insurance company to determine out of pocket expense if they have not yet received this vaccine. Advised may also receive vaccine at local pharmacy or  Health Dept. Verbalized acceptance and understanding.  Screening Tests Health Maintenance  Topic Date Due   INFLUENZA VACCINE  10/01/2022   COVID-19 Vaccine (4 - 2023-24 season) 11/01/2022   Zoster Vaccines- Shingrix (1 of 2) 07/20/2023 (Originally 07/07/1959)   Medicare Annual Wellness (AWV)  11/20/2023   Pneumonia Vaccine 10+ Years old  Completed   HPV VACCINES  Aged Out   DTaP/Tdap/Td  Discontinued    Health Maintenance  Health Maintenance Due  Topic Date Due   INFLUENZA VACCINE  10/01/2022   COVID-19 Vaccine (4 - 2023-24 season) 11/01/2022    Colorectal cancer screening: No longer required.   Lung Cancer Screening: (Low Dose CT Chest recommended if Age 93-80 years, 20 pack-year currently smoking OR have quit w/in 15years.) does not qualify.   Lung Cancer Screening Referral: n/a  Additional Screening:  Hepatitis C Screening: does qualify; Completed n/a  Vision Screening: Recommended annual ophthalmology exams for early detection of glaucoma and other disorders of the eye. Is the patient up to date with their annual eye exam?  Yes  Who is the provider or what is the name of the office in which the patient attends annual eye exams? Dr. Harlin Rain If pt is not established with a provider, would they like to be referred to a provider to establish care? No .   Dental Screening: Recommended annual dental exams for proper oral hygiene  Diabetic Foot Exam: patient is not a diabetic  Community Resource Referral / Chronic Care Management: CRR required this visit?  No   CCM required this visit?  No     Plan:     I have personally reviewed and noted the following in the patient's chart:   Medical and social history Use of alcohol, tobacco or illicit drugs  Current medications and supplements including opioid prescriptions. Patient is not currently taking opioid prescriptions. Functional ability and status Nutritional status Physical activity Advanced directives List of  other physicians Hospitalizations, surgeries, and ER visits in previous 12 months Vitals Screenings to include cognitive, depression, and falls Referrals and appointments  In addition, I have reviewed and discussed with patient certain preventive protocols, quality metrics, and best practice recommendations. A written personalized care plan for preventive services as well as general preventive health recommendations were provided to patient.     Tamela Oddi, CMA   11/20/2022  Non face to face 30 minutes  After Visit Summary: (MyChart) Due to this being a telephonic visit, the after visit summary with patients personalized plan was offered to patient via MyChart   Nurse Notes:  Mr. Economos , Thank you for taking time to come for your Medicare Wellness Visit. I appreciate your ongoing commitment to your health goals. Please review the following plan we discussed and let me know if I can assist you in the future.   These are the goals we discussed:  Goals   None     This is a list of the screening recommended for you and due dates:  Health Maintenance  Topic Date Due   Flu Shot  10/01/2022   COVID-19 Vaccine (4 - 2023-24 season) 11/01/2022   Zoster (Shingles) Vaccine (1 of 2) 07/20/2023*   Medicare Annual Wellness Visit  11/20/2023   Pneumonia Vaccine  Completed   HPV Vaccine  Aged Out   DTaP/Tdap/Td vaccine  Discontinued  *Topic was postponed. The date shown is not the original due date.

## 2022-11-20 NOTE — Telephone Encounter (Signed)
Pt had a virtual visit with Maralyn Sago in regards to this issue

## 2022-11-20 NOTE — Patient Instructions (Addendum)
Adrian Rios , Thank you for taking time to come for your Medicare Wellness Visit. I appreciate your ongoing commitment to your health goals. Please review the following plan we discussed and let me know if I can assist you in the future.   These are the goals we discussed:  Goals   None     This is a list of the screening recommended for you and due dates:  Health Maintenance  Topic Date Due   Flu Shot  10/01/2022   COVID-19 Vaccine (4 - 2023-24 season) 11/01/2022   Zoster (Shingles) Vaccine (1 of 2) 07/20/2023*   Medicare Annual Wellness Visit  11/20/2023   Pneumonia Vaccine  Completed   HPV Vaccine  Aged Out   DTaP/Tdap/Td vaccine  Discontinued  *Topic was postponed. The date shown is not the original due date.     Health Maintenance, Male Adopting a healthy lifestyle and getting preventive care are important in promoting health and wellness. Ask your health care provider about: The right schedule for you to have regular tests and exams. Things you can do on your own to prevent diseases and keep yourself healthy. What should I know about diet, weight, and exercise? Eat a healthy diet  Eat a diet that includes plenty of vegetables, fruits, low-fat dairy products, and lean protein. Do not eat a lot of foods that are high in solid fats, added sugars, or sodium. Maintain a healthy weight Body mass index (BMI) is a measurement that can be used to identify possible weight problems. It estimates body fat based on height and weight. Your health care provider can help determine your BMI and help you achieve or maintain a healthy weight. Get regular exercise Get regular exercise. This is one of the most important things you can do for your health. Most adults should: Exercise for at least 150 minutes each week. The exercise should increase your heart rate and make you sweat (moderate-intensity exercise). Do strengthening exercises at least twice a week. This is in addition to the  moderate-intensity exercise. Spend less time sitting. Even light physical activity can be beneficial. Watch cholesterol and blood lipids Have your blood tested for lipids and cholesterol at 82 years of age, then have this test every 5 years. You may need to have your cholesterol levels checked more often if: Your lipid or cholesterol levels are high. You are older than 82 years of age. You are at high risk for heart disease. What should I know about cancer screening? Many types of cancers can be detected early and may often be prevented. Depending on your health history and family history, you may need to have cancer screening at various ages. This may include screening for: Colorectal cancer. Prostate cancer. Skin cancer. Lung cancer. What should I know about heart disease, diabetes, and high blood pressure? Blood pressure and heart disease High blood pressure causes heart disease and increases the risk of stroke. This is more likely to develop in people who have high blood pressure readings or are overweight. Talk with your health care provider about your target blood pressure readings. Have your blood pressure checked: Every 3-5 years if you are 82-15 years of age. Every year if you are 64 years old or older. If you are between the ages of 26 and 33 and are a current or former smoker, ask your health care provider if you should have a one-time screening for abdominal aortic aneurysm (AAA). Diabetes Have regular diabetes screenings. This checks your fasting blood  sugar level. Have the screening done: Once every three years after age 52 if you are at a normal weight and have a low risk for diabetes. More often and at a younger age if you are overweight or have a high risk for diabetes. What should I know about preventing infection? Hepatitis B If you have a higher risk for hepatitis B, you should be screened for this virus. Talk with your health care provider to find out if you are at  risk for hepatitis B infection. Hepatitis C Blood testing is recommended for: Everyone born from [birth year] through 1965. Anyone with known risk factors for hepatitis C. Sexually transmitted infections (STIs) You should be screened each year for STIs, including gonorrhea and chlamydia, if: You are sexually active and are younger than 82 years of age. You are older than 82 years of age and your health care provider tells you that you are at risk for this type of infection. Your sexual activity has changed since you were last screened, and you are at increased risk for chlamydia or gonorrhea. Ask your health care provider if you are at risk. Ask your health care provider about whether you are at high risk for HIV. Your health care provider may recommend a prescription medicine to help prevent HIV infection. If you choose to take medicine to prevent HIV, you should first get tested for HIV. You should then be tested every 3 months for as long as you are taking the medicine. Follow these instructions at home: Alcohol use Do not drink alcohol if your health care provider tells you not to drink. If you drink alcohol: Limit how much you have to 0-2 drinks a day. Know how much alcohol is in your drink. In the [place] one drink equals one 12 oz bottle of beer (355 mL), one 5 oz glass of wine (148 mL), or one 1 oz glass of hard liquor (44 mL). Lifestyle Do not use any products that contain nicotine or tobacco. These products include cigarettes, chewing tobacco, and vaping devices, such as e-cigarettes. If you need help quitting, ask your health care provider. Do not use street drugs. Do not share needles. Ask your health care provider for help if you need support or information about quitting drugs. General instructions Schedule regular health, dental, and eye exams. Stay current with your vaccines. Tell your health care provider if: You often feel depressed. You have ever been abused or do not feel safe  at home. Summary Adopting a healthy lifestyle and getting preventive care are important in promoting health and wellness. Follow your health care provider's instructions about healthy diet, exercising, and getting tested or screened for diseases. Follow your health care provider's instructions on monitoring your cholesterol and blood pressure. This information is not intended to replace advice given to you by your health care provider. Make sure you discuss any questions you have with your health care provider. Document Revised: 07/08/2020 Document Reviewed: 07/08/2020 Elsevier Patient Education  2024 [employer].

## 2022-11-20 NOTE — Assessment & Plan Note (Signed)
Acute Due to history of antibiotic resistance recommend treating with mupirocin ointment topically for 2 to 3 days.  If symptoms persist or do not improve treat with a course of oral doxycycline x 10 days. Patient encouraged to proceed to call office for in person evaluation if symptoms persist despite above treatment plan.

## 2022-12-24 DIAGNOSIS — Z23 Encounter for immunization: Secondary | ICD-10-CM | POA: Diagnosis not present

## 2023-02-16 ENCOUNTER — Encounter: Payer: Self-pay | Admitting: Internal Medicine

## 2023-02-17 ENCOUNTER — Other Ambulatory Visit: Payer: Self-pay

## 2023-02-17 MED ORDER — FLUTICASONE-SALMETEROL 250-50 MCG/ACT IN AEPB: 1.0000 | INHALATION_SPRAY | Freq: Two times a day (BID) | RESPIRATORY_TRACT | 5 refills | Status: DC

## 2023-02-18 ENCOUNTER — Encounter: Payer: Self-pay | Admitting: Internal Medicine

## 2023-02-18 DIAGNOSIS — H0014 Chalazion left upper eyelid: Secondary | ICD-10-CM | POA: Diagnosis not present

## 2023-02-23 ENCOUNTER — Encounter: Payer: Self-pay | Admitting: Internal Medicine

## 2023-02-26 MED ORDER — VENLAFAXINE HCL 75 MG PO TABS: 150.0000 mg | ORAL_TABLET | Freq: Every day | ORAL | 2 refills | Status: DC

## 2023-03-02 DIAGNOSIS — Z961 Presence of intraocular lens: Secondary | ICD-10-CM | POA: Diagnosis not present

## 2023-03-02 DIAGNOSIS — D2310 Other benign neoplasm of skin of unspecified eyelid, including canthus: Secondary | ICD-10-CM | POA: Diagnosis not present

## 2023-03-02 DIAGNOSIS — H2512 Age-related nuclear cataract, left eye: Secondary | ICD-10-CM | POA: Diagnosis not present

## 2023-03-02 DIAGNOSIS — H4089 Other specified glaucoma: Secondary | ICD-10-CM | POA: Diagnosis not present

## 2023-04-19 ENCOUNTER — Other Ambulatory Visit: Payer: Self-pay | Admitting: Internal Medicine

## 2023-04-19 DIAGNOSIS — N132 Hydronephrosis with renal and ureteral calculous obstruction: Secondary | ICD-10-CM

## 2023-04-28 ENCOUNTER — Other Ambulatory Visit: Payer: Self-pay | Admitting: Internal Medicine

## 2023-05-02 ENCOUNTER — Encounter (HOSPITAL_BASED_OUTPATIENT_CLINIC_OR_DEPARTMENT_OTHER): Payer: Self-pay | Admitting: Emergency Medicine

## 2023-05-02 ENCOUNTER — Emergency Department (HOSPITAL_BASED_OUTPATIENT_CLINIC_OR_DEPARTMENT_OTHER)
Admission: EM | Admit: 2023-05-02 | Discharge: 2023-05-02 | Disposition: A | Discharge status: Home / Self Care | Attending: Emergency Medicine | Admitting: Emergency Medicine

## 2023-05-02 ENCOUNTER — Ambulatory Visit: Admission: EM | Admit: 2023-05-02 | Discharge: 2023-05-02 | Disposition: A | Discharge status: Home / Self Care

## 2023-05-02 ENCOUNTER — Other Ambulatory Visit: Payer: Self-pay

## 2023-05-02 ENCOUNTER — Encounter: Payer: Self-pay | Admitting: Emergency Medicine

## 2023-05-02 ENCOUNTER — Emergency Department (HOSPITAL_BASED_OUTPATIENT_CLINIC_OR_DEPARTMENT_OTHER)

## 2023-05-02 DIAGNOSIS — R42 Dizziness and giddiness: Secondary | ICD-10-CM | POA: Diagnosis not present

## 2023-05-02 DIAGNOSIS — R509 Fever, unspecified: Secondary | ICD-10-CM | POA: Diagnosis not present

## 2023-05-02 DIAGNOSIS — J45909 Unspecified asthma, uncomplicated: Secondary | ICD-10-CM | POA: Insufficient documentation

## 2023-05-02 DIAGNOSIS — R059 Cough, unspecified: Secondary | ICD-10-CM | POA: Diagnosis not present

## 2023-05-02 DIAGNOSIS — R0602 Shortness of breath: Secondary | ICD-10-CM | POA: Diagnosis not present

## 2023-05-02 DIAGNOSIS — Z20828 Contact with and (suspected) exposure to other viral communicable diseases: Secondary | ICD-10-CM | POA: Diagnosis not present

## 2023-05-02 DIAGNOSIS — J101 Influenza due to other identified influenza virus with other respiratory manifestations: Secondary | ICD-10-CM | POA: Diagnosis not present

## 2023-05-02 DIAGNOSIS — R0981 Nasal congestion: Secondary | ICD-10-CM | POA: Diagnosis present

## 2023-05-02 DIAGNOSIS — Z96612 Presence of left artificial shoulder joint: Secondary | ICD-10-CM | POA: Diagnosis not present

## 2023-05-02 LAB — RESP PANEL BY RT-PCR (RSV, FLU A&B, COVID)  RVPGX2
Influenza A by PCR: POSITIVE — AB
Influenza B by PCR: NEGATIVE
Resp Syncytial Virus by PCR: NEGATIVE
SARS Coronavirus 2 by RT PCR: NEGATIVE

## 2023-05-02 MED ORDER — OSELTAMIVIR PHOSPHATE 75 MG PO CAPS: 75.0000 mg | ORAL_CAPSULE | Freq: Two times a day (BID) | ORAL | 0 refills | Status: DC

## 2023-05-02 NOTE — ED Notes (Signed)
EDP into see 

## 2023-05-02 NOTE — ED Notes (Signed)
 Patient is being discharged from the Urgent Care and sent to the Emergency Department via POV . Per Reita May, FNP, patient is in need of higher level of care due to low bp. Patient is aware and verbalizes understanding of plan of care.  Vitals:   05/02/23 0851 05/02/23 0911  BP: 106/65 100/62  Pulse: (!) 103   Resp: 18   Temp: 98.9 F (37.2 C)   SpO2: 92%

## 2023-05-02 NOTE — Discharge Instructions (Signed)
 You were seen today for influenza A.  I have sent in a prescription of Tamiflu to your pharmacy.  You can pick this up and take as prescribed.  Your chest x-ray was clear and your vitals alongside your physical exam are both reassuring that I have low suspicion for any emergent process present at this time.  Continue to isolate till fever free for 24 hours.  Return to ED if you begin to have any worsening shortness of breath, chest pain, unrelenting fever despite Tylenol and ibuprofen.

## 2023-05-02 NOTE — ED Notes (Signed)
 Reviewed discharge instructions and medications with pt. Pt provided information on adult influenza. Pt states understanding.

## 2023-05-02 NOTE — ED Provider Notes (Signed)
 [facility]    CSN: [id number] Arrival date & time: 05/02/23  4098      History   Chief Complaint No chief complaint on file.   HPI Adrian [facility] is a 83 y.o. male.   Patient presents to urgent care with his wife who contributes to the history for evaluation of cough, nasal congestion, generalized fatigue, fever, and intermittent dizziness with position changes that started yesterday.  Recently exposed to an 28-month-old who is now sick with influenza.  Max temp at home 100.7 and has responded well to Tylenol.  Cough is mostly dry and nonproductive.  Reports intermittent shortness of breath associated with cough.  Denies chest pain, nausea, vomiting, diarrhea, abdominal pain, rash, and ear pain.  Reports generalized head pain that has not responded well to use of Tylenol.  Dizziness is elicited with position changes.  Denies history of chronic respiratory problems, never smoker.  Taking Tylenol over-the-counter with some relief.     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    s/p  MOH's nasal --  pre-melanoma   History of squamous cell carcinoma excision    several times   Hyperlipidemia    Pre-diabetes    Premature ventricular contractions (PVCs) (VPCs)    RAD (reactive airway disease)    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  s/p  deep neck lymph node bx's and  lung bx's   Sensorineural hearing loss (SNHL) of both ears    Sepsis (HCC) 09/2016   Ureterolithiasis    UTI (urinary tract infection) 08/2016   Wears glasses    Wears hearing aid    bilateral    Patient Active Problem List   Diagnosis Date Noted   Bite, insect 11/20/2022   Fracture of one rib, right side, initial encounter for closed fracture 11/05/2022   Thrush, oral 04/29/2022   Ureteral stone 04/29/2022   Bacteremia due to Klebsiella pneumoniae 04/20/2022   Acute nonintractable headache 04/20/2022   Sepsis associated hypotension (HCC) 04/19/2022   Acute pyelonephritis 04/19/2022   Acute respiratory failure with hypoxia (HCC) 04/19/2022   Ureteral stone with hydronephrosis 04/19/2022   ARF (acute renal failure) (HCC) 04/19/2022   Anemia 04/19/2022   Sepsis due to urinary tract infection (HCC)    Recurrent UTI 11/11/2021   Hypomagnesemia 06/25/2021   Left shoulder pain 05/01/2021   Right flank pain 03/17/2021   CKD (chronic kidney disease) stage 3, GFR 30-59 ml/min (HCC) 03/17/2021   Pyelonephritis 03/16/2021   Complicated UTI (urinary tract infection) 11/24/2020   Sepsis due to complicated UTI and patient with history of ESBL UTI 11/24/2020   History of ESBL E. 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   Carotid bruit 02/17/2016   Prediabetes 08/20/2015   Glaucoma 02/14/2015   Varicose veins of bilateral lower extremities with other complications 03/20/2013   Asthma 10/27/2011   Squamous  carcinoma (HCC), 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   PREMATURE VENTRICULAR CONTRACTIONS 04/29/2010   ERECTILE DYSFUNCTION 04/23/2010   DISC DISEASE, LUMBOSACRAL SPINE 02/06/2010   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   Depression 04/12/2008   Allergic rhinitis 04/12/2008   Hyperlipidemia 10/05/2006   Sarcoidosis (HCC) 07/05/2006    Past Surgical History:  Procedure Laterality Date   APPENDECTOMY  2008   CARDIOVASCULAR STRESS TEST  05/27/2010   normal nuclear study w/ no ischemia/  normal LV function and wall motion , ef 60%   CYSTOSCOPY WITH INJECTION N/A 06/12/2016   Procedure: CYSTOSCOPY WITH INJECTION OF INDOCYANINE GREEN DYE;  Surgeon: Sebastian Ache, MD;  Location: [facility];  Service: Urology;  Laterality: N/A;   DEEP NECK LYMPH NODE BIOPSY / EXCISION  1970's   and Bronchoscopy w/ bx's ( dx Sarcoidosis)   ILEOSTOMY     IR NEPHROSTOMY PLACEMENT LEFT  11/18/2016   IR NEPHROSTOMY PLACEMENT RIGHT  04/19/2022   MOHS SURGERY  2013 approx.    nasal; pre melanoma   PARS PLANA VITRECTOMY Right 2007   repair macular pucker   TONSILLECTOMY AND ADENOIDECTOMY  child   TRANSURETHRAL RESECTION OF BLADDER TUMOR N/A 04/06/2016   Procedure: CYSTOSCOPY TRANSURETHRAL RESECTION OF BLADDER TUMORS (TURBT);  Surgeon: Jethro Bolus, MD;  Location: [facility];  Service: Urology;  Laterality: N/A;   TRANSURETHRAL RESECTION OF PROSTATE         Home Medications    Prior to Admission medications   Medication Sig Start Date End Date Taking? Authorizing Provider  acetaminophen (TYLENOL) 500 MG tablet Take 1,000 mg by mouth as needed for headache (pain).    [provider]  albuterol (VENTOLIN HFA) 108 (90 Base) MCG/ACT inhaler Inhale 2 puffs into the lungs every 6 (six) hours as needed for wheezing or shortness of breath. Patient taking differently: Inhale 1-2 puffs into the lungs every 6 (six) hours as needed for wheezing or shortness of breath. 02/01/20   Pincus Sanes, MD  Ascorbic Acid  (VITAMIN C) 1000 MG tablet Take 1,000 mg by mouth daily.    [provider]  cholecalciferol (VITAMIN D3) 25 MCG (1000 UNIT) tablet Take 1,000 Units by mouth daily.    [provider]  Cinnamon 500 MG capsule Take 500 mg by mouth daily.    [provider]  doxycycline (VIBRA-TABS) 100 MG tablet Take 1 tablet (100 mg total) by mouth 2 (two) times daily. 11/20/22   Elenore Paddy, NP  fluticasone-salmeterol (ADVAIR DISKUS) 250-50 MCG/ACT AEPB Inhale 1 puff into the lungs every 12 (twelve) hours. INHALE 1 PUFF INTO THE LUNGS EVERY 12 HOURS 02/17/23   Burns, Bobette Mo, MD  HYDROcodone-acetaminophen (NORCO/VICODIN) 5-325 MG tablet Take 2 tablets by mouth every 6 (six) hours as needed. Patient not taking: Reported on 11/20/2022 11/05/22   Pincus Sanes, MD  latanoprost (XALATAN) 0.005 % ophthalmic solution Place  1 drop into the right eye at bedtime. 05/06/20   [provider]  magnesium oxide (MAG-OX) 400 MG tablet Take 400 mg by mouth daily with supper.    [provider]  meclizine (ANTIVERT) 12.5 MG tablet Take 1-2 tablets (12.5-25 mg total) by mouth 3 (three) times daily as needed for dizziness. 06/16/22   Pincus Sanes, MD  methenamine (HIPREX) 1 g tablet Take 1 tablet (1 g total) by mouth 2 (two) times daily with a meal. 06/21/21   Gonfa, Boyce Medici, MD  Misc Natural Products (GLUCOSAMINE CHOND DOUBLE STR PO) Take 1 tablet by mouth 2 (two) times daily.    [provider]  montelukast (SINGULAIR) 10 MG tablet Take 1 tablet (10 mg total) by mouth at bedtime. 11/05/22   Pincus Sanes, MD  mupirocin ointment (BACTROBAN) 2 % Apply 1 Application topically 2 (two) times daily. 11/20/22   Elenore Paddy, NP  triamcinolone (NASACORT) 55 MCG/ACT AERO nasal inhaler Place 2 sprays into the nose at bedtime as needed (allergies).    [provider]  venlafaxine (EFFEXOR) 75 MG tablet Take 2 tablets (150 mg total) by mouth daily. 02/26/23   Corwin Levins, MD     Family History Family History  Problem Relation Age of Onset   Stroke Mother        in her 78s   Breast cancer Sister    Heart disease Maternal Grandmother 80       MI    Breast cancer Paternal Grandmother    Heart disease Paternal Grandfather 81       MI   Stroke Brother    Healthy Father    Diabetes Neg Hx     Social History Social History   Tobacco Use   Smoking status: Never   Smokeless tobacco: Never  Vaping Use   Vaping status: Never Used  Substance Use Topics   Alcohol use: No   Drug use: No     Allergies   Ivp dye [iodinated contrast media], Metrizamide, Shellfish allergy, Gabapentin, Niacin-lovastatin er, Gadolinium, and Adhesive [tape]   Review of Systems Review of Systems Per HPI  Physical Exam Triage Vital Signs ED Triage Vitals  Encounter Vitals Group     BP 05/02/23 0851 106/65     Systolic BP Percentile --      Diastolic BP Percentile --      Pulse Rate 05/02/23 0851 (!) 103     Resp 05/02/23 0851 18     Temp 05/02/23 0851 98.9 F (37.2 C)     Temp Source 05/02/23 0851 Oral     SpO2 05/02/23 0851 92 %     Weight --      Height --      Head Circumference --      Peak Flow --      Pain Score 05/02/23 0854 6     Pain Loc --      Pain Education --      Exclude from Growth Chart --    No data found.  Updated Vital Signs BP 100/62 (BP Location: Right Arm)   Pulse (!) 103   Temp 98.9 F (37.2 C) (Oral)   Resp 18   SpO2 92%   Visual Acuity Right Eye Distance:   Left Eye Distance:   Bilateral Distance:    Right Eye Near:   Left Eye Near:    Bilateral Near:     Physical Exam Vitals and nursing note reviewed.  Constitutional:      Appearance: He is ill-appearing. He is not toxic-appearing.  HENT:     Head: Normocephalic and atraumatic.     Right Ear: Hearing, tympanic membrane, ear canal and external ear normal.     Left Ear: Hearing, tympanic membrane, ear canal and external ear normal.     Nose: Nose normal.      Mouth/Throat:     Lips: Pink.     Mouth: Mucous membranes are dry. No injury or oral lesions.     Dentition: Normal dentition.     Tongue: No lesions.     Pharynx: Oropharynx is clear. Uvula midline. No pharyngeal swelling, oropharyngeal exudate, posterior oropharyngeal erythema, uvula swelling or postnasal drip.     Tonsils: No tonsillar exudate.  Eyes:     General: Lids are normal. Vision grossly intact. Gaze aligned appropriately.     Extraocular Movements: Extraocular movements intact.     Conjunctiva/sclera: Conjunctivae normal.  Neck:     Trachea: Trachea and phonation normal.  Cardiovascular:     Rate and Rhythm: Normal rate and regular rhythm.     Heart sounds: Normal heart sounds, S1 normal and S2 normal.  Pulmonary:     Effort: Pulmonary effort is normal. No respiratory distress.     Breath sounds: Normal breath sounds and air entry. No wheezing, rhonchi or rales.     Comments: Speaking in full sentences without difficulty or increased respiratory effort. Chest:     Chest wall: No tenderness.  Musculoskeletal:     Cervical back: Neck supple.  Lymphadenopathy:     Cervical: No cervical adenopathy.  Skin:    General: Skin is warm and dry.     Capillary Refill: Capillary refill takes less than 2 seconds.     Findings: No rash.  Neurological:     General: No focal deficit present.     Mental Status: He is alert and oriented to person, place, and time. Mental status is at baseline.     Cranial Nerves: No dysarthria or facial asymmetry.  Psychiatric:        Mood and Affect: Mood normal.        Speech: Speech normal.        Behavior: Behavior normal.        Thought Content: Thought content normal.        Judgment: Judgment normal.      UC Treatments / Results  Labs (all labs ordered are listed, but only abnormal results are displayed) Labs Reviewed - No data to display  EKG   Radiology No results found.  Procedures Procedures (including critical care  time)  Medications Ordered in UC Medications - No data to display  Initial Impression / Assessment and Plan / UC Course  I have reviewed the triage vital signs and the nursing notes.  Pertinent labs & imaging results that were available during my care of the patient were reviewed by me and considered in my medical decision making (see chart for details).   1.  Exposure to influenza, dizziness Presentation highly suspicious for influenza given recent exposure and quick onset of symptoms. Oxygen saturation initially 92 to 93%, on recheck fluctuating between 91 to 92%.  This is lower than patient's baseline of around 95 to 96% on room air. Lung sounds clear, normal work of breathing. Blood pressure soft initially and on recheck around 100/62. Patient appears to be mildly clinically dehydrated. Recommend further workup and evaluation in the emergency room given patient's complex  medical history of frequent sepsis and risk factors for hospitalization due to influenza.   Discussed clinical concerns/exam findings leading to recommendation for further workup in the ER setting and risks of deferring ER visit with patient/family. Patient/family express understanding and agreement with plan, discharged to ER via private car with his wife. Offered EMS, patient declined.    Final Clinical Impressions(s) / UC Diagnoses   Final diagnoses:  Exposure to influenza  Dizziness   Discharge Instructions   None    ED Prescriptions   None    PDMP not reviewed this encounter.   Reita May Murillo, [place] 05/02/23 [phone]

## 2023-05-02 NOTE — ED Triage Notes (Signed)
 Pt presents with cough, body aches, and chills since yesterday. He was exposed to flu on Wednesday  Took tylenol around 5 this morning

## 2023-05-02 NOTE — ED Provider Notes (Signed)
 [place] EMERGENCY DEPARTMENT AT [facility] [place] Provider Note   CSN: [id number] Arrival date & time: 05/02/23  6578     History  No chief complaint on file.   [facility] is a 83 y.o. male.  HPI Patient is an 89-year-old male presents ED today with his wife with a 1 day history of cough congestion, fever after being exposed to his grandson recently who had been recently diagnosed with influenza.  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  Denies any change in urine color, smell.   Seen by urgent care today due to concerns for complicated URI due to having a history of sepsis and was concern for dehydration.  Patient states that he feels like he needs Tamiflu and can go home. Endorses mild shortness of breath which he says is normal for him. Denies any chest pain, abdominal pain, nausea, vomiting, diarrhea, lower extremity swelling    Home Medications Prior to Admission medications   Medication Sig Start Date End Date Taking? Authorizing Provider  oseltamivir (TAMIFLU) 75 MG capsule Take 1 capsule (75 mg total) by mouth every 12 (twelve) hours. 05/02/23  Yes Lunette Stands, PA-C  acetaminophen (TYLENOL) 500 MG tablet Take 1,000 mg by mouth as needed for headache (pain).    [provider]  albuterol (VENTOLIN HFA) 108 (90 Base) MCG/ACT inhaler Inhale 2 puffs into the lungs every 6 (six) hours as needed for wheezing or shortness of breath. Patient taking differently: Inhale 1-2 puffs into the lungs every 6 (six) hours as needed for wheezing or shortness of breath. 02/01/20   Pincus Sanes, MD  Ascorbic Acid (VITAMIN C) 1000 MG tablet Take 1,000 mg by mouth daily.    [provider]  cholecalciferol (VITAMIN D3) 25 MCG (1000 UNIT) tablet Take 1,000 Units by mouth daily.    [provider]  Cinnamon 500 MG capsule Take 500 mg by mouth daily.    [provider]  doxycycline (VIBRA-TABS) 100 MG tablet Take 1 tablet (100 mg total) by mouth 2 (two) times daily. 11/20/22   Elenore Paddy, NP  fluticasone-salmeterol (ADVAIR DISKUS) 250-50 MCG/ACT AEPB Inhale 1 puff into the lungs every 12 (twelve) hours. INHALE 1 PUFF INTO THE LUNGS EVERY 12 HOURS 02/17/23   Burns, Bobette Mo, MD  HYDROcodone-acetaminophen (NORCO/VICODIN) 5-325 MG tablet Take 2 tablets by mouth every 6 (six) hours as needed. Patient not taking: Reported on 11/20/2022 11/05/22   Pincus Sanes, MD  latanoprost (XALATAN) 0.005 % ophthalmic solution Place 1 drop into the right eye at bedtime. 05/06/20   [provider]  magnesium oxide (MAG-OX) 400 MG tablet Take 400 mg by mouth daily with supper.    [provider]  meclizine (ANTIVERT) 12.5 MG tablet Take 1-2 tablets (12.5-25 mg total) by mouth 3 (three) times daily as needed for dizziness. 06/16/22   Pincus Sanes, MD  methenamine (HIPREX) 1 g tablet Take 1 tablet (1 g total) by mouth 2 (two) times daily with a meal. 06/21/21   Gonfa, Boyce Medici, MD  Misc Natural Products (GLUCOSAMINE CHOND DOUBLE STR PO) Take 1 tablet by mouth 2 (two) times daily.    [provider]  montelukast (SINGULAIR) 10 MG tablet Take 1 tablet (10 mg total) by mouth at bedtime. 11/05/22   Pincus Sanes, MD  mupirocin ointment (BACTROBAN) 2 % Apply 1 Application topically 2 (two) times daily. 11/20/22  Elenore Paddy, NP  triamcinolone (NASACORT) 55 MCG/ACT AERO nasal inhaler Place 2 sprays into the nose at bedtime as needed (allergies).    [provider]  venlafaxine (EFFEXOR) 75 MG tablet Take 2 tablets (150 mg total) by mouth daily. 02/26/23   Corwin Levins, MD      Allergies    Ivp dye [iodinated contrast media], Metrizamide, Shellfish allergy, Gabapentin, Niacin-lovastatin er, Gadolinium, and Adhesive [tape]    Review of Systems   Review of Systems  Constitutional:  Positive for fever.  HENT:  Positive for congestion.    Respiratory:  Positive for cough.   All other systems reviewed and are negative.   Physical Exam Updated Vital Signs BP (!) 102/59   Pulse 77   Temp 98.3 F (36.8 C)   Resp 18   Wt 78 kg   SpO2 91%   BMI 26.94 kg/m  Physical Exam Vitals and nursing note reviewed.  Constitutional:      General: He is not in acute distress.    Appearance: Normal appearance. He is not ill-appearing.  HENT:     Head: Normocephalic and atraumatic.  Eyes:     General: No scleral icterus.       Right eye: No discharge.        Left eye: No discharge.     Extraocular Movements: Extraocular movements intact.     Conjunctiva/sclera: Conjunctivae normal.  Cardiovascular:     Rate and Rhythm: Normal rate and regular rhythm.     Pulses: Normal pulses.     Heart sounds: Normal heart sounds. No murmur heard.    No friction rub. No gallop.  Pulmonary:     Effort: Pulmonary effort is normal. No respiratory distress.     Breath sounds: Normal breath sounds. No stridor. No wheezing, rhonchi or rales.  Abdominal:     General: Abdomen is flat. There is no distension.     Palpations: Abdomen is soft.     Tenderness: There is no abdominal tenderness. There is no right CVA tenderness, left CVA tenderness or guarding.  Musculoskeletal:     Cervical back: Normal range of motion and neck supple. No rigidity or tenderness.     Right lower leg: No edema.     Left lower leg: No edema.  Skin:    General: Skin is warm and dry.     Coloration: Skin is not pale.     Findings: No erythema or rash.  Neurological:     General: No focal deficit present.     Mental Status: He is alert. Mental status is at baseline.     Sensory: No sensory deficit.     Motor: No weakness.     Gait: Gait normal.  Psychiatric:        Mood and Affect: Mood normal.     ED Results / Procedures / Treatments   Labs (all labs ordered are listed, but only abnormal results are displayed) Labs Reviewed  RESP PANEL BY RT-PCR (RSV, FLU  A&B, COVID)  RVPGX2 - Abnormal; Notable for the following components:      Result Value   Influenza A by PCR POSITIVE (*)    All other components within normal limits    EKG None  Radiology DG Chest 2 View Result Date: 05/02/2023 CLINICAL DATA:  Shortness of breath. Fever and cough. Body aches for 3 days. EXAM: CHEST - 2 VIEW COMPARISON:  Chest and rib radiographs 11/02/2022. FINDINGS: Heart size is normal. Lungs are  clear. No edema or effusion is present. No focal airspace disease is present. Left shoulder arthroplasty is noted. The visualized soft tissues and bony thorax are otherwise unremarkable. IMPRESSION: Negative two view chest radiographs. Electronically Signed   By: Marin Roberts M.D.   On: 05/02/2023 12:00    Procedures Procedures    Medications Ordered in ED Medications - No data to display  ED Course/ Medical Decision Making/ A&P                                 Medical Decision Making  Patient is an 34-year-old male presents ED today with his wife with a 1 day history of cough congestion, fever after being exposed to his grandson recently who had been recently diagnosed with influenza.  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  Denies any change in urine color, smell.   Seen by urgent care today due to concerns for complicated URI due to having a history of sepsis and was concern for dehydration.  Patient states that he feels like he needs Tamiflu and can go home. Endorses mild shortness of breath which he says is normal for him. Denies any chest pain, abdominal pain, nausea, vomiting, diarrhea, lower extremity swelling    Physical exam was unremarkable, with patient being afebrile, no acute distress, able to answer questions appropriately.  Patient was smiling.   Patient and spouse were confused why they were sent to the ED as patient has been able to tolerate  fluids well and been eating and drinking appropriately.  They are wishing to go home.  With symptoms improving upon ingestion of fluids/food.  Chest x-ray was unremarkable.  Patient was noted to be positive for influenza A on respiratory panel.  Due to patient symptoms and high risk, prescribe Tamiflu outpatient.  Patient vital signs have remained stable to the course of his time here.  With his blood pressure improving with fluids and food.  Low concern for sepsis, metabolic disturbance, ACS, PE, pneumonia, meningitis at this time.  No hypoxia on exam.  I believe this patient is to be discharged and have symptoms monitored at home while taking Tamiflu.  Differential diagnoses prior to evaluation: The emergent differential diagnosis includes, but is not limited to, URI, pneumonia, bronchitis, sepsis, dehydration, metabolic disturbance, PE, ACS. This is not an exhaustive differential.   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  Additional history: Chart reviewed. Pertinent results include:   Seen today urgent care for flulike symptoms and was told to come to the ED due to concerns for possible sepsis, dehydration requiring fluids.  Last hospitalization was on 04/19/2022 due to sepsis secondary to UTI.  Lab Tests/Imaging studies: I personally interpreted labs/imaging and the pertinent results include:   Respiratory panel positive for influenza A Chest x-ray unremarkable I agree with the radiologist interpretation.    Medications: I ordered medication including Tamiflu outpatient.  I have reviewed the patients home medicines and have made adjustments as needed.    Disposition: After consideration of the diagnostic results and the patients response to treatment, I feel that the patient benefit from discharge and treatment as above.   emergency department workup does  not suggest an emergent condition requiring admission or immediate intervention beyond what has been performed at this  time. The plan is: Tamiflu at home, monitor symptoms at home, return for any new or worsening symptoms. The patient is safe for discharge and has been instructed to return immediately for worsening symptoms, change in symptoms or any other concerns.  Final Clinical Impression(s) / ED Diagnoses Final diagnoses:  Influenza A    Rx / DC Orders ED Discharge Orders          Ordered    oseltamivir (TAMIFLU) 75 MG capsule  Every 12 hours        05/02/23 1135              Lunette Stands, [place] 05/02/23 1233    Rolan Bucco, MD 05/02/23 1444

## 2023-05-02 NOTE — ED Triage Notes (Signed)
 Fever and cough , body aches x 3 days , was seen at [facility] , sent here for hydration per pt .

## 2023-05-02 NOTE — ED Notes (Addendum)
 EDPA at [facility]. Pt alert, NAD, calm, interactive, resps e/u, speaking clearly. Family at [facility].

## 2023-05-03 ENCOUNTER — Telehealth: Payer: Self-pay

## 2023-05-03 ENCOUNTER — Ambulatory Visit: Payer: Self-pay | Admitting: Internal Medicine

## 2023-05-03 NOTE — Transitions of Care (Post Inpatient/ED Visit) (Unsigned)
   05/03/2023  Name: AMARION PORTELL MRN: [id number] DOB: 1940-12-30  Today's TOC FU Call Status: Today's TOC FU Call Status:: Unsuccessful Call (1st Attempt) Unsuccessful Call (1st Attempt) Date: 05/03/23  Attempted to reach the patient regarding the most recent Inpatient/ED visit.  Follow Up Plan: Additional outreach attempts will be made to reach the patient to complete the Transitions of Care (Post Inpatient/ED visit) call.   Signature Karena Addison, LPN [facility] Nurse Health Advisor Direct Dial [phone]

## 2023-05-03 NOTE — Telephone Encounter (Signed)
 Chief Complaint: Flu follow up / Lump on leg Symptoms: see above Frequency: Flu (since weekend) / Lump - a few weeks Pertinent Negatives: Patient denies fever Disposition: [] ED /[] Urgent Care (no appt availability in office) / [x] Appointment(In office/virtual)/ []  [facility]/ [] Home Care/ [] Refused Recommended Disposition /[]  Mobile Bus/ []  Follow-up with PCP Additional Notes: Patient was seen in ED yesterday and diagnosed with Flu A. Patient states he feels better but strongly advised by physician in ED to follow up in office with PCP. Patient also wants to be seen about acute issue with a swollen lump on his right shin that is blue in color and painful. Patient doesn't remember hitting or injurying his leg. Patient appt made for tomorrow for further evaluation.   Copied from CRM [id number]. Topic: Clinical - Red Word Triage >> May 03, 2023  3:14 PM Deaijah H wrote: Red Word that prompted transfer to Nurse Triage: ER Follow up - diagnosed with flu/breathing problems/ body in pain/lump on leg very painful to touch Reason for Disposition  [1] Swelling is painful to touch AND [2] no fever  Answer Assessment - Initial Assessment Questions 1. WORST SYMPTOM: "What is your worst symptom?" (e.g., cough, runny nose, muscle aches, headache, sore throat, fever)      Body aches, headache, cough,  2. ONSET: "When did your flu symptoms start?"      Saturday 3. COUGH: "How bad is the cough?"       Moderate  4. RESPIRATORY DISTRESS: "Describe your breathing."      Breathing is normal 5. FEVER: "Do you have a fever?" If Yes, ask: "What is your temperature, how was it measured, and when did it start?"     No 6. EXPOSURE: "Were you exposed to someone with influenza?"       Exposed to family member with flu 7. FLU VACCINE: "Did you get a flu shot this year?"     Yes 8. HIGH RISK DISEASE: "Do you have any chronic medical problems?" (e.g., heart or lung disease, asthma, weak immune  system, or other HIGH RISK conditions)     Very susceptible to bronchitis, bladder cancer history 10. OTHER SYMPTOMS: "Do you have any other symptoms?"  (e.g., runny nose, muscle aches, headache, sore throat)       Muscle aches, headache  Answer Assessment - Initial Assessment Questions 1. APPEARANCE of SWELLING: "What does it look like?"     Appears like a swollen area that is raised  2. SIZE: "How large is the swelling?" (e.g., inches, cm; or compare to size of pinhead, tip of pen, eraser, coin, pea, grape, ping pong ball)      2.5 inches by 1 inch  3. LOCATION: "Where is the swelling located?"     Right leg on the shin 4. ONSET: "When did the swelling start?"     A few weeks 5. COLOR: "What color is it?" "Is there more than one color?"     Blue shade  6. PAIN: "Is there any pain?" If Yes, ask: "How bad is the pain?" (e.g., scale 1-10; or mild, moderate, severe)     - NONE (0): no pain   - MILD (1-3): doesn't interfere with normal activities    - MODERATE (4-7): interferes with normal activities or awakens from sleep    - SEVERE (8-10): excruciating pain, unable to do any normal activities     6 7. ITCH: "Does it itch?" If Yes, ask: "How bad is the itch?"  No 8. CAUSE: "What do you think caused the swelling?"     Uncertain of cause  9 OTHER SYMPTOMS: "Do you have any other symptoms?" (e.g., fever)     Weakness in leg  Protocols used: Influenza (Flu) - Seasonal-A-AH, Skin Lump or Localized Swelling-A-AH

## 2023-05-04 ENCOUNTER — Ambulatory Visit: Admitting: Internal Medicine

## 2023-05-04 ENCOUNTER — Encounter: Payer: Self-pay | Admitting: Internal Medicine

## 2023-05-04 VITALS — BP 114/72 | HR 85 | Temp 97.8°F | Ht 67.0 in | Wt 173.0 lb

## 2023-05-04 DIAGNOSIS — M79661 Pain in right lower leg: Secondary | ICD-10-CM | POA: Diagnosis not present

## 2023-05-04 DIAGNOSIS — J101 Influenza due to other identified influenza virus with other respiratory manifestations: Secondary | ICD-10-CM | POA: Diagnosis not present

## 2023-05-04 DIAGNOSIS — M7989 Other specified soft tissue disorders: Secondary | ICD-10-CM

## 2023-05-04 MED ORDER — HYDROCODONE BIT-HOMATROP MBR 5-1.5 MG/5ML PO SOLN: 5.0000 mL | Freq: Three times a day (TID) | ORAL | 0 refills | Status: DC | PRN

## 2023-05-04 NOTE — Assessment & Plan Note (Signed)
 Acute Symptoms started 3 days ago, diagnosed 2 days ago Started on Tamiflu which has not helped much Chest x-ray 2 days ago normal Still having significant symptoms concerned about possible bronchitis Lungs clear on exam Symptoms consistent with flu Discussed symptomatic treatment Hycodan cough syrup prescribed Can complete Tamiflu if tolerating it Tylenol, can take Mucinex or other over-the-counter cold medication for symptom relief Stressed rest and fluids Call if symptoms are not continuing to improve or worsen

## 2023-05-04 NOTE — Transitions of Care (Post Inpatient/ED Visit) (Signed)
   05/04/2023  Name: Adrian Rios MRN: [id number] DOB: [birth year]  Today's TOC FU Call Status: Today's TOC FU Call Status:: Unsuccessful Call (1st Attempt) Unsuccessful Call (1st Attempt) Date: 05/03/23  Attempted to reach the patient regarding the most recent Inpatient/ED visit.  Follow Up Plan: No further outreach attempts will be made at this time. We have been unable to contact the patient. Patient already seen in office Signature Karena Addison, LPN [facility] Nurse Health Advisor Direct Dial [phone]

## 2023-05-04 NOTE — Patient Instructions (Addendum)
       Medications changes include :   hycodan cough syrup    A ultrasound of your right lower leg was ordered was ordered and someone will call you to schedule an appointment.     Return if symptoms worsen or fail to improve.

## 2023-05-04 NOTE — Assessment & Plan Note (Signed)
 Acute Noticed this 2-3 days ago Right lower anterior-medial leg Area of swelling, tenderness Pain is worse when standing, better when laying There are some prominent nontender varicose veins and also a lump that is tender ?  Superficial blood clot, less likely DVT Vascular ultrasound ordered for rule out clot Can apply heat, symptomatic treatment

## 2023-05-04 NOTE — Progress Notes (Signed)
 Subjective:    Patient ID: Adrian Rios, male    DOB: 30-May-1940, 83 y.o.   MRN: [id number]      HPI Adrian Rios is here for  Chief Complaint  Patient presents with   Mass    Right leg, lateral part of shin there is a painful knot. First noticed when dx w the flu   Cough    Wants to be checked for bronchitis    Symptoms started 3 days ago -he did go to the emergency room and was diagnosed with flu and started on tamiflu.  CXR at that time was normal.  He is taking Tamiflu and Tylenol.  He does not feel like the Tamiflu has helped much.  He has been drinking plenty of fluids.  He has decreased appetite.  He is coughing a lot and the cough has been productive.  He states mild shortness of breath, wheezing, fatigue, mild nasal congestion, sore throat, headaches and dizziness.  He initially had a low-grade fever, but has not had a fever recently.    Right lower leg swelling and tenderness.  He first noticed this when he was diagnosed with the flu.  He does have some pain when he walks, but no pain when he is laying in bed.  He denies any trauma.     Medications and allergies reviewed with patient and updated if appropriate.  Current Outpatient Medications on File Prior to Visit  Medication Sig Dispense Refill   acetaminophen (TYLENOL) 500 MG tablet Take 1,000 mg by mouth as needed for headache (pain).     albuterol (VENTOLIN HFA) 108 (90 Base) MCG/ACT inhaler Inhale 2 puffs into the lungs every 6 (six) hours as needed for wheezing or shortness of breath. (Patient taking differently: Inhale 1-2 puffs into the lungs every 6 (six) hours as needed for wheezing or shortness of breath.) 1 each 8   Ascorbic Acid (VITAMIN C) 1000 MG tablet Take 1,000 mg by mouth daily.     cholecalciferol (VITAMIN D3) 25 MCG (1000 UNIT) tablet Take 1,000 Units by mouth daily.     Cinnamon 500 MG capsule Take 500 mg by mouth daily.     fluticasone-salmeterol (ADVAIR DISKUS) 250-50 MCG/ACT AEPB Inhale 1 puff  into the lungs every 12 (twelve) hours. INHALE 1 PUFF INTO THE LUNGS EVERY 12 HOURS 60 each 5   latanoprost (XALATAN) 0.005 % ophthalmic solution Place 1 drop into the right eye at bedtime.     magnesium oxide (MAG-OX) 400 MG tablet Take 400 mg by mouth daily with supper.     meclizine (ANTIVERT) 12.5 MG tablet Take 1-2 tablets (12.5-25 mg total) by mouth 3 (three) times daily as needed for dizziness. 30 tablet 0   methenamine (HIPREX) 1 g tablet Take 1 tablet (1 g total) by mouth 2 (two) times daily with a meal.     Misc Natural Products (GLUCOSAMINE CHOND DOUBLE STR PO) Take 1 tablet by mouth 2 (two) times daily.     montelukast (SINGULAIR) 10 MG tablet Take 1 tablet (10 mg total) by mouth at bedtime. 90 tablet 3   mupirocin ointment (BACTROBAN) 2 % Apply 1 Application topically 2 (two) times daily. 22 g 0   oseltamivir (TAMIFLU) 75 MG capsule Take 1 capsule (75 mg total) by mouth every 12 (twelve) hours. 10 capsule 0   triamcinolone (NASACORT) 55 MCG/ACT AERO nasal inhaler Place 2 sprays into the nose at bedtime as needed (allergies).     venlafaxine (EFFEXOR) 75 MG  tablet Take 2 tablets (150 mg total) by mouth daily. 180 tablet 2   HYDROcodone-acetaminophen (NORCO/VICODIN) 5-325 MG tablet Take 2 tablets by mouth every 6 (six) hours as needed. (Patient not taking: Reported on 05/04/2023) 20 tablet 0   No current facility-administered medications on file prior to visit.    Review of Systems  Constitutional:  Positive for appetite change (decreased), fatigue and fever (low grade for a few days).  HENT:  Positive for congestion (some) and sore throat. Negative for ear pain and sinus pain.   Respiratory:  Positive for cough (productive), shortness of breath (somewhat) and wheezing. Negative for chest tightness.   Gastrointestinal:  Positive for diarrhea.  Musculoskeletal:  Positive for myalgias.  Neurological:  Positive for dizziness and headaches.       Objective:   Vitals:   05/04/23 1514   BP: 114/72  Pulse: 85  Temp: 97.8 F (36.6 C)  SpO2: 97%   BP Readings from Last 3 Encounters:  05/04/23 114/72  05/02/23 (!) 102/59  05/02/23 100/62   Wt Readings from Last 3 Encounters:  05/04/23 173 lb (78.5 kg)  05/02/23 172 lb (78 kg)  11/20/22 172 lb (78 kg)   Body mass index is 27.1 kg/m.    Physical Exam Constitutional:      General: He is not in acute distress.    Appearance: Normal appearance. He is not ill-appearing.  HENT:     Head: Normocephalic.     Right Ear: Tympanic membrane, ear canal and external ear normal. There is no impacted cerumen.     Left Ear: Tympanic membrane, ear canal and external ear normal. There is no impacted cerumen.     Mouth/Throat:     Mouth: Mucous membranes are moist.     Pharynx: No oropharyngeal exudate or posterior oropharyngeal erythema.  Eyes:     Conjunctiva/sclera: Conjunctivae normal.  Cardiovascular:     Rate and Rhythm: Normal rate and regular rhythm.  Pulmonary:     Effort: Pulmonary effort is normal. No respiratory distress.     Breath sounds: Normal breath sounds. No wheezing or rales.  Musculoskeletal:        General: Tenderness (Oval-shaped area the size of a small avocado it is slightly swollen in the right lower anterior-medial right.  Prominent varicose veins that are tender and a palpable knot that is also tender.  No significant overlying erythema or warmth) present.     Cervical back: Neck supple. No tenderness.  Lymphadenopathy:     Cervical: No cervical adenopathy.  Skin:    General: Skin is warm and dry.     Findings: No erythema or rash.  Neurological:     Mental Status: He is alert.            Assessment & Plan:    See Problem List for Assessment and Plan of chronic medical problems.

## 2023-05-05 ENCOUNTER — Ambulatory Visit (HOSPITAL_COMMUNITY)
Admission: RE | Admit: 2023-05-05 | Discharge: 2023-05-05 | Disposition: A | Discharge status: Home / Self Care | Source: Ambulatory Visit | Attending: Internal Medicine | Admitting: Internal Medicine

## 2023-05-05 DIAGNOSIS — M7989 Other specified soft tissue disorders: Secondary | ICD-10-CM | POA: Insufficient documentation

## 2023-05-05 DIAGNOSIS — M79661 Pain in right lower leg: Secondary | ICD-10-CM | POA: Diagnosis not present

## 2023-05-06 ENCOUNTER — Encounter: Payer: Self-pay | Admitting: Internal Medicine

## 2023-05-06 DIAGNOSIS — I8001 Phlebitis and thrombophlebitis of superficial vessels of right lower extremity: Secondary | ICD-10-CM | POA: Insufficient documentation

## 2023-06-02 NOTE — Progress Notes (Unsigned)
 Office Note     CC: Thrombophlebitis of superficial veins in the right lower extremity Requesting Provider:  Pincus Sanes, MD  HPI: Adrian Rios is a 83 y.o. (1940/10/29) male who presents at the request of Burns, Bobette Mo, MD for evaluation of ***.   Venous symptoms include: positive if (X) [  ] aching [  ] heavy [  ] tired  [  ] throbbing [  ] burning  [  ] itching [  ]swelling [  ] bleeding [  ] ulcer  Onset/duration:  ***  Occupation:  *** Aggravating factors: (sitting, standing) Alleviating factors: (elevation) Compression:  *** Helps:  *** Pain medications:  *** Previous vein procedures:  *** History of DVT:  ***   The pt *** on a statin for cholesterol management.  The pt *** on a daily aspirin.   Other AC:  *** The pt *** on *** for hypertension.   The pt *** diabetic.  *** Tobacco hx:  ***  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    s/p  MOH's nasal --  pre-melanoma   History of squamous cell carcinoma excision    several times   Hyperlipidemia    Pre-diabetes    Premature ventricular contractions (PVCs) (VPCs)    RAD (reactive airway disease)    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  s/p  deep neck lymph node bx's and lung bx's   Sensorineural hearing loss (SNHL) of both ears    Sepsis (HCC) 09/2016   Ureterolithiasis    UTI (urinary tract  infection) 08/2016   Wears glasses    Wears hearing aid    bilateral    Past Surgical History:  Procedure Laterality Date   APPENDECTOMY  2008   CARDIOVASCULAR STRESS TEST  05/27/2010   normal nuclear study w/ no ischemia/  normal LV function and wall motion , ef 60%   CYSTOSCOPY WITH INJECTION N/A 06/12/2016   Procedure: CYSTOSCOPY WITH INJECTION OF INDOCYANINE GREEN DYE;  Surgeon: Sebastian Ache, MD;  Location: [facility];  Service: Urology;  Laterality: N/A;   DEEP NECK LYMPH NODE BIOPSY / EXCISION  1970's   and Bronchoscopy w/ bx's ( dx Sarcoidosis)   ILEOSTOMY     IR NEPHROSTOMY PLACEMENT LEFT  11/18/2016   IR NEPHROSTOMY PLACEMENT RIGHT  04/19/2022   MOHS SURGERY  2013 approx.    nasal; pre melanoma   PARS PLANA VITRECTOMY Right 2007   repair macular pucker   TONSILLECTOMY AND ADENOIDECTOMY  child   TRANSURETHRAL RESECTION OF BLADDER TUMOR N/A 04/06/2016   Procedure: CYSTOSCOPY TRANSURETHRAL RESECTION OF BLADDER TUMORS (TURBT);  Surgeon: Jethro Bolus, MD;  Location: [facility];  Service: Urology;  Laterality: N/A;  TRANSURETHRAL RESECTION OF PROSTATE      Social History   Socioeconomic History   Marital status: Married    Spouse name: Not on file   Number of children: 2   Years of education: Not on file   Highest education level: 12th grade  Occupational History   Occupation: retired  Tobacco Use   Smoking status: Never   Smokeless tobacco: Never  Vaping Use   Vaping status: Never Used  Substance and Sexual Activity   Alcohol use: No   Drug use: No   Sexual activity: Not Currently  Other Topics Concern   Not on file  Social History Narrative   Not on file   Social Drivers of Health   Financial Resource Strain: Low Risk  (11/20/2022)   Overall Financial Resource Strain (CARDIA)    Difficulty of Paying Living Expenses: Not hard at all  Food Insecurity: No Food Insecurity (11/20/2022)   Hunger Vital Sign    Worried About Running Out of Food in  the Last Year: Never true    Ran Out of Food in the Last Year: Never true  Transportation Needs: No Transportation Needs (11/20/2022)   PRAPARE - Administrator, Civil Service (Medical): No    Lack of Transportation (Non-Medical): No  Physical Activity: Insufficiently Active (11/20/2022)   Exercise Vital Sign    Days of Exercise per Week: 2 days    Minutes of Exercise per Session: 30 min  Stress: No Stress Concern Present (11/20/2022)   [employer] of Occupational Health - Occupational Stress Questionnaire    Feeling of Stress : Not at all  Social Connections: Socially Integrated (11/20/2022)   Social Connection and Isolation Panel [NHANES]    Frequency of Communication with Friends and Family: Three times a week    Frequency of Social Gatherings with Friends and Family: Three times a week    Attends Religious Services: More than 4 times per year    Active Member of Clubs or Organizations: Yes    Attends Banker Meetings: More than 4 times per year    Marital Status: Married  Catering manager Violence: Not At Risk (11/20/2022)   Humiliation, Afraid, Rape, and Kick questionnaire    Fear of Current or Ex-Partner: No    Emotionally Abused: No    Physically Abused: No    Sexually Abused: No   *** Family History  Problem Relation Age of Onset   Stroke Mother        in her 62s   Breast cancer Sister    Heart disease Maternal Grandmother 51       MI    Breast cancer Paternal Grandmother    Heart disease Paternal Grandfather 67       MI   Stroke Brother    Healthy Father    Diabetes Neg Hx     Current Outpatient Medications  Medication Sig Dispense Refill   acetaminophen (TYLENOL) 500 MG tablet Take 1,000 mg by mouth as needed for headache (pain).     albuterol (VENTOLIN HFA) 108 (90 Base) MCG/ACT inhaler Inhale 2 puffs into the lungs every 6 (six) hours as needed for wheezing or shortness of breath. (Patient taking differently: Inhale 1-2 puffs into  the lungs every 6 (six) hours as needed for wheezing or shortness of breath.) 1 each 8   Ascorbic Acid (VITAMIN C) 1000 MG tablet Take 1,000 mg by mouth daily.     cholecalciferol (VITAMIN D3) 25 MCG (1000 UNIT) tablet  Take 1,000 Units by mouth daily.     Cinnamon 500 MG capsule Take 500 mg by mouth daily.     fluticasone-salmeterol (ADVAIR DISKUS) 250-50 MCG/ACT AEPB Inhale 1 puff into the lungs every 12 (twelve) hours. INHALE 1 PUFF INTO THE LUNGS EVERY 12 HOURS 60 each 5   HYDROcodone bit-homatropine (HYCODAN) 5-1.5 MG/5ML syrup Take 5 mLs by mouth every 8 (eight) hours as needed for cough. 120 mL 0   latanoprost (XALATAN) 0.005 % ophthalmic solution Place 1 drop into the right eye at bedtime.     magnesium oxide (MAG-OX) 400 MG tablet Take 400 mg by mouth daily with supper.     meclizine (ANTIVERT) 12.5 MG tablet Take 1-2 tablets (12.5-25 mg total) by mouth 3 (three) times daily as needed for dizziness. 30 tablet 0   methenamine (HIPREX) 1 g tablet Take 1 tablet (1 g total) by mouth 2 (two) times daily with a meal.     Misc Natural Products (GLUCOSAMINE CHOND DOUBLE STR PO) Take 1 tablet by mouth 2 (two) times daily.     montelukast (SINGULAIR) 10 MG tablet Take 1 tablet (10 mg total) by mouth at bedtime. 90 tablet 3   mupirocin ointment (BACTROBAN) 2 % Apply 1 Application topically 2 (two) times daily. 22 g 0   oseltamivir (TAMIFLU) 75 MG capsule Take 1 capsule (75 mg total) by mouth every 12 (twelve) hours. 10 capsule 0   triamcinolone (NASACORT) 55 MCG/ACT AERO nasal inhaler Place 2 sprays into the nose at bedtime as needed (allergies).     venlafaxine (EFFEXOR) 75 MG tablet Take 2 tablets (150 mg total) by mouth daily. 180 tablet 2   No current facility-administered medications for this visit.    Allergies  Allergen Reactions   Ivp Dye [Iodinated Contrast Media] Hives, Shortness Of Breath, Itching and Palpitations    "eyes itching,  Heart racing,  Effected breathing" 10-27-16 pt with  13 hr pre-meds for CT without any reaction-kj   Metrizamide Shortness Of Breath, Itching and Palpitations     "eyes itching,  Heart racing,  Effected breathing" 10-27-16 pt with 13 hr pre-meds for CT without any reaction-kj   Shellfish Allergy Hives, Shortness Of Breath and Itching    Mostly crab   Gabapentin Other (See Comments)    dizziness and flushing   Niacin-Lovastatin Er Other (See Comments)    Did not feel good on medication   Gadolinium Hives   Adhesive [Tape] Itching and Rash    Use paper tape     REVIEW OF SYSTEMS:  *** [X]  denotes positive finding, [ ]  denotes negative finding Cardiac  Comments:  Chest pain or chest pressure:    Shortness of breath upon exertion:    Short of breath when lying flat:    Irregular heart rhythm:        Vascular    Pain in calf, thigh, or hip brought on by ambulation:    Pain in feet at night that wakes you up from your sleep:     Blood clot in your veins:    Leg swelling:         Pulmonary    Oxygen at home:    Productive cough:     Wheezing:         Neurologic    Sudden weakness in arms or legs:     Sudden numbness in arms or legs:     Sudden onset of difficulty speaking or slurred speech:    Temporary loss  of vision in one eye:     Problems with dizziness:         Gastrointestinal    Blood in stool:     Vomited blood:         Genitourinary    Burning when urinating:     Blood in urine:        Psychiatric    Major depression:         Hematologic    Bleeding problems:    Problems with blood clotting too easily:        Skin    Rashes or ulcers:        Constitutional    Fever or chills:      PHYSICAL EXAMINATION:  There were no vitals filed for this visit.  General:  WDWN in NAD; vital signs documented above Gait: Not observed HENT: WNL, normocephalic Pulmonary: normal non-labored breathing , without Rales, rhonchi,  wheezing Cardiac: {Desc; regular/irreg:14544} HR, without  Murmurs {With/Without:20273}  carotid bruit*** Abdomen: soft, NT, no masses Skin: {With/Without:20273} rashes Vascular Exam/Pulses:  Right Left  Radial {Exam; arterial pulse strength 0-4:30167} {Exam; arterial pulse strength 0-4:30167}  Ulnar {Exam; arterial pulse strength 0-4:30167} {Exam; arterial pulse strength 0-4:30167}  Femoral {Exam; arterial pulse strength 0-4:30167} {Exam; arterial pulse strength 0-4:30167}  Popliteal {Exam; arterial pulse strength 0-4:30167} {Exam; arterial pulse strength 0-4:30167}  DP {Exam; arterial pulse strength 0-4:30167} {Exam; arterial pulse strength 0-4:30167}  PT {Exam; arterial pulse strength 0-4:30167} {Exam; arterial pulse strength 0-4:30167}   Extremities: {With/Without:20273} ischemic changes, {With/Without:20273} Gangrene , {With/Without:20273} cellulitis; {With/Without:20273} open wounds;  Musculoskeletal: no muscle wasting or atrophy  Neurologic: A&O X 3;  No focal weakness or paresthesias are detected Psychiatric:  The pt has {Desc; normal/abnormal:11317::"Normal"} affect.   Non-Invasive Vascular Imaging:     +---------+---------------+---------+-----------+----------+--------------+   RIGHT   CompressibilityPhasicitySpontaneityPropertiesThrombus  Aging  +---------+---------------+---------+-----------+----------+--------------+   CFV     Full           Yes      Yes                                   +---------+---------------+---------+-----------+----------+--------------+   SFJ     Full           Yes      Yes                                   +---------+---------------+---------+-----------+----------+--------------+   FV Prox  Full           Yes      Yes                                   +---------+---------------+---------+-----------+----------+--------------+   FV Mid   Full           Yes      Yes                                   +---------+---------------+---------+-----------+----------+--------------+   FV  DistalFull           Yes      Yes                                   +---------+---------------+---------+-----------+----------+--------------+  PFV     Full                                                          +---------+---------------+---------+-----------+----------+--------------+   POP     Full           Yes      Yes                                   +---------+---------------+---------+-----------+----------+--------------+   PTV     Full           Yes      Yes                                   +---------+---------------+---------+-----------+----------+--------------+   PERO    Full           Yes      Yes                                   +---------+---------------+---------+-----------+----------+--------------+   Gastroc Full                                                          +---------+---------------+---------+-----------+----------+--------------+   GSV     Full           Yes      Yes                                   +---------+---------------+---------+-----------+----------+--------------+          +----+---------------+---------+-----------+----------+--------------+  LEFTCompressibilityPhasicitySpontaneityPropertiesThrombus Aging  +----+---------------+---------+-----------+----------+--------------+  CFV Full           Yes      Yes                                  +----+---------------+---------+-----------+----------+--------------+            Findings reported to Dr. Lawerance Bach through secure chat in EPIC at 1:55 pm.    Summary:  RIGHT:  - No evidence of deep vein thrombosis in the lower extremity. No indirect  evidence of obstruction proximal to the inguinal ligament.  - Findings consistent with acute superficial vein thrombophlebitis  involving the right varicosities or other superficial veins in the  anteromedial mid calf region.        ASSESSMENT/PLAN:: 83 y.o. male  presenting with ***   ***   Victorino Sparrow, MD [facility] [phone]

## 2023-06-03 ENCOUNTER — Ambulatory Visit (INDEPENDENT_AMBULATORY_CARE_PROVIDER_SITE_OTHER): Admitting: Vascular Surgery

## 2023-06-03 ENCOUNTER — Encounter: Payer: Self-pay | Admitting: Vascular Surgery

## 2023-06-03 VITALS — BP 106/71 | HR 79 | Temp 98.7°F | Ht 67.0 in | Wt 166.0 lb

## 2023-06-03 DIAGNOSIS — I8001 Phlebitis and thrombophlebitis of superficial vessels of right lower extremity: Secondary | ICD-10-CM | POA: Diagnosis not present

## 2023-08-20 DIAGNOSIS — H4089 Other specified glaucoma: Secondary | ICD-10-CM | POA: Diagnosis not present

## 2023-08-20 DIAGNOSIS — Z961 Presence of intraocular lens: Secondary | ICD-10-CM | POA: Diagnosis not present

## 2023-08-20 DIAGNOSIS — D1801 Hemangioma of skin and subcutaneous tissue: Secondary | ICD-10-CM | POA: Diagnosis not present

## 2023-08-20 DIAGNOSIS — H02821 Cysts of right upper eyelid: Secondary | ICD-10-CM | POA: Diagnosis not present

## 2023-08-20 DIAGNOSIS — B5801 Toxoplasma chorioretinitis: Secondary | ICD-10-CM | POA: Diagnosis not present

## 2023-08-20 DIAGNOSIS — H2512 Age-related nuclear cataract, left eye: Secondary | ICD-10-CM | POA: Diagnosis not present

## 2023-08-20 DIAGNOSIS — H44651 Retained (old) magnetic foreign body in vitreous body, right eye: Secondary | ICD-10-CM | POA: Diagnosis not present

## 2023-08-20 DIAGNOSIS — Z8669 Personal history of other diseases of the nervous system and sense organs: Secondary | ICD-10-CM | POA: Diagnosis not present

## 2023-09-01 ENCOUNTER — Other Ambulatory Visit: Payer: Self-pay | Admitting: Internal Medicine

## 2023-09-07 DIAGNOSIS — Z9049 Acquired absence of other specified parts of digestive tract: Secondary | ICD-10-CM | POA: Diagnosis not present

## 2023-09-07 DIAGNOSIS — Z936 Other artificial openings of urinary tract status: Secondary | ICD-10-CM | POA: Diagnosis not present

## 2023-09-07 DIAGNOSIS — N202 Calculus of kidney with calculus of ureter: Secondary | ICD-10-CM | POA: Diagnosis not present

## 2023-09-07 DIAGNOSIS — N200 Calculus of kidney: Secondary | ICD-10-CM | POA: Diagnosis not present

## 2023-09-07 DIAGNOSIS — Z8551 Personal history of malignant neoplasm of bladder: Secondary | ICD-10-CM | POA: Diagnosis not present

## 2023-09-07 DIAGNOSIS — N390 Urinary tract infection, site not specified: Secondary | ICD-10-CM | POA: Diagnosis not present

## 2023-09-07 DIAGNOSIS — Z906 Acquired absence of other parts of urinary tract: Secondary | ICD-10-CM | POA: Diagnosis not present

## 2023-09-08 DIAGNOSIS — J4520 Mild intermittent asthma, uncomplicated: Secondary | ICD-10-CM | POA: Diagnosis not present

## 2023-09-08 DIAGNOSIS — D869 Sarcoidosis, unspecified: Secondary | ICD-10-CM | POA: Diagnosis not present

## 2023-09-27 ENCOUNTER — Other Ambulatory Visit: Payer: Self-pay | Admitting: Internal Medicine

## 2023-10-07 DIAGNOSIS — L821 Other seborrheic keratosis: Secondary | ICD-10-CM | POA: Diagnosis not present

## 2023-10-07 DIAGNOSIS — L814 Other melanin hyperpigmentation: Secondary | ICD-10-CM | POA: Diagnosis not present

## 2023-10-07 DIAGNOSIS — Z129 Encounter for screening for malignant neoplasm, site unspecified: Secondary | ICD-10-CM | POA: Diagnosis not present

## 2023-10-07 DIAGNOSIS — X32XXXS Exposure to sunlight, sequela: Secondary | ICD-10-CM | POA: Diagnosis not present

## 2023-10-07 DIAGNOSIS — L304 Erythema intertrigo: Secondary | ICD-10-CM | POA: Diagnosis not present

## 2023-10-14 DIAGNOSIS — J4520 Mild intermittent asthma, uncomplicated: Secondary | ICD-10-CM | POA: Diagnosis not present

## 2023-10-14 DIAGNOSIS — Z79899 Other long term (current) drug therapy: Secondary | ICD-10-CM | POA: Diagnosis not present

## 2023-10-14 DIAGNOSIS — Z91041 Radiographic dye allergy status: Secondary | ICD-10-CM | POA: Diagnosis not present

## 2023-10-14 DIAGNOSIS — D869 Sarcoidosis, unspecified: Secondary | ICD-10-CM | POA: Diagnosis not present

## 2023-10-14 DIAGNOSIS — H409 Unspecified glaucoma: Secondary | ICD-10-CM | POA: Diagnosis not present

## 2023-10-14 DIAGNOSIS — H2512 Age-related nuclear cataract, left eye: Secondary | ICD-10-CM | POA: Diagnosis not present

## 2023-10-14 DIAGNOSIS — Z91013 Allergy to seafood: Secondary | ICD-10-CM | POA: Diagnosis not present

## 2023-10-14 DIAGNOSIS — Z888 Allergy status to other drugs, medicaments and biological substances status: Secondary | ICD-10-CM | POA: Diagnosis not present

## 2023-11-13 ENCOUNTER — Encounter: Payer: Self-pay | Admitting: Internal Medicine

## 2023-11-15 MED ORDER — VENLAFAXINE HCL 75 MG PO TABS: 112.5000 mg | ORAL_TABLET | Freq: Every day | ORAL | 1 refills | Status: AC

## 2023-11-15 NOTE — Addendum Note (Signed)
 Addended by: GEOFM GLADE PARAS on: 11/15/2023 09:17 PM   Modules accepted: Orders

## 2023-11-24 ENCOUNTER — Ambulatory Visit (INDEPENDENT_AMBULATORY_CARE_PROVIDER_SITE_OTHER)

## 2023-11-24 VITALS — BP 120/62 | HR 70 | Ht 66.0 in | Wt 167.2 lb

## 2023-11-24 DIAGNOSIS — Z0000 Encounter for general adult medical examination without abnormal findings: Secondary | ICD-10-CM | POA: Diagnosis not present

## 2023-11-24 NOTE — Patient Instructions (Addendum)
 Adrian Rios,  Thank you for taking the time for your Medicare Wellness Visit. I appreciate your continued commitment to your health goals. Please review the care plan we discussed, and feel free to reach out if I can assist you further.  Medicare recommends these wellness visits once per year to help you and your care team stay ahead of potential health issues. These visits are designed to focus on prevention, allowing your provider to concentrate on managing your acute and chronic conditions during your regular appointments.  Please note that Annual Wellness Visits do not include a physical exam. Some assessments may be limited, especially if the visit was conducted virtually. If needed, we may recommend a separate in-person follow-up with your provider.  Ongoing Care Seeing your primary care provider every 3 to 6 months helps us  monitor your health and provide consistent, personalized care.  Referrals If a referral was made during today's visit and you haven't received any updates within two weeks, please contact the referred provider directly to check on the status.  Recommended Screenings:  Health Maintenance  Topic Date Due   Zoster (Shingles) Vaccine (1 of 2) 07/07/1959   Flu Shot  10/01/2023   COVID-19 Vaccine (4 - 2025-26 season) 11/01/2023   Medicare Annual Wellness Visit  11/23/2024   Pneumococcal Vaccine for age over 91  Completed   HPV Vaccine  Aged Out   Meningitis B Vaccine  Aged Out   DTaP/Tdap/Td vaccine  Discontinued       11/24/2023    8:07 AM  Advanced Directives  Does Patient Have a Medical Advance Directive? Yes  Type of Estate agent of [place];Living will  Copy of Healthcare Power of Attorney in Chart? No - copy requested   Advance Care Planning is important because it: Ensures you receive medical care that aligns with your values, goals, and preferences. Provides guidance to your family and loved ones, reducing the emotional burden of  decision-making during critical moments.  Vision: Annual vision screenings are recommended for early detection of glaucoma, cataracts, and diabetic retinopathy. These exams can also reveal signs of chronic conditions such as diabetes and high blood pressure.  Dental: Annual dental screenings help detect early signs of oral cancer, gum disease, and other conditions linked to overall health, including heart disease and diabetes.

## 2023-11-24 NOTE — Progress Notes (Signed)
 Subjective:   Adrian Rios is a 83 y.o. who presents for a Medicare Wellness preventive visit.  As a reminder, Annual Wellness Visits don't include a physical exam, and some assessments may be limited, especially if this visit is performed virtually. We may recommend an in-person follow-up visit with your provider if needed.  Visit Complete: In person  Persons Participating in Visit: Patient.  AWV Questionnaire: Yes: Patient Medicare AWV questionnaire was completed by the patient on 11/23/2023; I have confirmed that all information answered by patient is correct and no changes since this date.  Cardiac Risk Factors include: advanced age (>69men, >43 women);dyslipidemia;male gender     Objective:    Today's Vitals   11/24/23 0807  BP: 120/62  Pulse: 70  Weight: 167 lb 3.2 oz (75.8 kg)  Height: 5' 6 (1.676 m)   Body mass index is 26.99 kg/m.     11/24/2023    8:07 AM 05/02/2023   10:01 AM 05/02/2023   10:00 AM 11/20/2022   10:25 AM 11/02/2022    3:26 PM 04/19/2022   12:17 AM 10/10/2021    1:00 AM  Advanced Directives  Does Patient Have a Medical Advance Directive? Yes Yes No No;Yes No Yes No  Type of Estate agent of [place];Living will Living will;Healthcare Power of [employer] Power of [place];Living will  Healthcare Power of [place];Living will   Does patient want to make changes to medical advance directive?      No - Patient declined   Copy of Healthcare Power of Attorney in Chart? No - copy requested        Would patient like information on creating a medical advance directive?    No - Patient declined  No - Patient declined No - Patient declined    Current Medications (verified) Outpatient Encounter Medications as of 11/24/2023  Medication Sig   acetaminophen  (TYLENOL ) 500 MG tablet Take 1,000 mg by mouth as needed for headache (pain).   albuterol  (VENTOLIN  HFA) 108 (90 Base) MCG/ACT inhaler Inhale 2 puffs into the lungs every 6 (six)  hours as needed for wheezing or shortness of breath. (Patient taking differently: Inhale 1-2 puffs into the lungs every 6 (six) hours as needed for wheezing or shortness of breath.)   Ascorbic Acid (VITAMIN C) 1000 MG tablet Take 1,000 mg by mouth daily.   cholecalciferol (VITAMIN D3) 25 MCG (1000 UNIT) tablet Take 1,000 Units by mouth daily.   Cinnamon 500 MG capsule Take 500 mg by mouth daily.   fluticasone -salmeterol (ADVAIR ) 250-50 MCG/ACT AEPB INHALE 1 PUFF INTO THE LUNGS EVERY 12 HOURS   latanoprost  (XALATAN ) 0.005 % ophthalmic solution Place 1 drop into the right eye at bedtime.   magnesium  oxide (MAG-OX) 400 MG tablet Take 400 mg by mouth daily with supper.   meclizine  (ANTIVERT ) 12.5 MG tablet Take 1-2 tablets (12.5-25 mg total) by mouth 3 (three) times daily as needed for dizziness.   methenamine  (HIPREX ) 1 g tablet Take 1 tablet (1 g total) by mouth 2 (two) times daily with a meal.   Misc Natural Products (GLUCOSAMINE CHOND DOUBLE STR PO) Take 1 tablet by mouth 2 (two) times daily.   montelukast  (SINGULAIR ) 10 MG tablet Take 1 tablet (10 mg total) by mouth at bedtime.   triamcinolone  (NASACORT ) 55 MCG/ACT AERO nasal inhaler Place 2 sprays into the nose at bedtime as needed (allergies).   venlafaxine  (EFFEXOR ) 75 MG tablet Take 1.5 tablets (112.5 mg total) by mouth daily.  HYDROcodone  bit-homatropine (HYCODAN) 5-1.5 MG/5ML syrup Take 5 mLs by mouth every 8 (eight) hours as needed for cough. (Patient not taking: Reported on 11/24/2023)   No facility-administered encounter medications on file as of 11/24/2023.    Allergies (verified) Ivp dye [iodinated contrast media], Metrizamide, Shellfish allergy, Gabapentin, Niacin-lovastatin er, Gadolinium, and Adhesive [tape]   History: Past Medical History:  Diagnosis Date   Allergic rhinitis    Arthritis    Benign localized prostatic hyperplasia with lower urinary tract symptoms (LUTS)    Bladder tumor    Borderline glaucoma of right eye     Cancer (HCC)    bladder and prostate   Carotid bruit    per duplex 03-11-2016 RICA 1-39%   Chronic throat clearing    COVID-19    DDD (degenerative disc disease), lumbar    Depression    ED (erectile dysfunction)    Elevated PSA    urologist-  dr chales--- s/p  prostate bx's   GERD (gastroesophageal reflux disease)    Hematuria    History of chronic bronchitis    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    s/p  MOH's nasal --  pre-melanoma   History of squamous cell carcinoma excision    several times   Hyperlipidemia    Pre-diabetes    Premature ventricular contractions (PVCs) (VPCs)    RAD (reactive airway disease)    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    dx 1970's  s/p  deep neck lymph node bx's and lung bx's   Sensorineural hearing loss (SNHL) of both ears    Sepsis (HCC) 09/2016   Ureterolithiasis    UTI (urinary tract infection) 08/2016   Wears glasses    Wears hearing aid    bilateral   Past Surgical History:  Procedure Laterality Date   APPENDECTOMY  2008   CARDIOVASCULAR STRESS TEST  05/27/2010   normal nuclear study w/ no ischemia/  normal LV function and wall motion , ef 60%   CYSTOSCOPY WITH INJECTION N/A 06/12/2016   Procedure: CYSTOSCOPY WITH INJECTION OF INDOCYANINE GREEN DYE;  Surgeon: Ricardo Likens, MD;  Location: [facility];  Service: Urology;  Laterality: N/A;   DEEP NECK LYMPH NODE BIOPSY / EXCISION  1970's   and Bronchoscopy w/ bx's ( dx Sarcoidosis)   ILEOSTOMY     IR NEPHROSTOMY PLACEMENT LEFT  11/18/2016   IR NEPHROSTOMY PLACEMENT RIGHT  04/19/2022   MOHS SURGERY  2013 approx.    nasal; pre melanoma   PARS PLANA VITRECTOMY Right 2007   repair macular pucker   TONSILLECTOMY AND ADENOIDECTOMY  child   TRANSURETHRAL RESECTION OF BLADDER TUMOR N/A 04/06/2016   Procedure: CYSTOSCOPY TRANSURETHRAL RESECTION OF BLADDER TUMORS (TURBT);  Surgeon: Arlena chales, MD;  Location: [facility];  Service: Urology;  Laterality: N/A;    TRANSURETHRAL RESECTION OF PROSTATE     Family History  Problem Relation Age of Onset   Stroke Mother        in her 38s   Breast cancer Sister    Heart disease Maternal Grandmother 55       MI    Breast cancer Paternal Grandmother    Heart disease Paternal Grandfather 77       MI   Stroke Brother    Healthy Father    Diabetes Neg Hx    Social History   Socioeconomic History   Marital status: Married    Spouse name: Not on file   Number of children: 2  Years of education: Not on file   Highest education level: 12th grade  Occupational History   Occupation: retired  Tobacco Use   Smoking status: Never   Smokeless tobacco: Never  Vaping Use   Vaping status: Never Used  Substance and Sexual Activity   Alcohol use: No   Drug use: No   Sexual activity: Not Currently  Other Topics Concern   Not on file  Social History Narrative   Married   Social Drivers of Health   Financial Resource Strain: Low Risk  (11/24/2023)   Overall Financial Resource Strain (CARDIA)    Difficulty of Paying Living Expenses: Not very hard  Food Insecurity: No Food Insecurity (11/24/2023)   Hunger Vital Sign    Worried About Running Out of Food in the Last Year: Never true    Ran Out of Food in the Last Year: Never true  Transportation Needs: No Transportation Needs (11/24/2023)   PRAPARE - Administrator, Civil Service (Medical): No    Lack of Transportation (Non-Medical): No  Physical Activity: Insufficiently Active (11/24/2023)   Exercise Vital Sign    Days of Exercise per Week: 5 days    Minutes of Exercise per Session: 10 min  Stress: No Stress Concern Present (11/24/2023)   [employer] of Occupational Health - Occupational Stress Questionnaire    Feeling of Stress: Not at all  Social Connections: Socially Integrated (11/24/2023)   Social Connection and Isolation Panel    Frequency of Communication with Friends and Family: More than three times a week    Frequency of  Social Gatherings with Friends and Family: More than three times a week    Attends Religious Services: More than 4 times per year    Active Member of [employer] or Organizations: Yes    Attends Engineer, structural: More than 4 times per year    Marital Status: Married    Tobacco Counseling Counseling given: Not Answered    Clinical Intake:  Pre-visit preparation completed: Yes  Pain : No/denies pain     BMI - recorded: 26.99 Nutritional Status: BMI 25 -29 Overweight Nutritional Risks: None Diabetes: No  Lab Results  Component Value Date   HGBA1C 5.7 06/25/2021   HGBA1C 5.9 08/02/2020   HGBA1C 5.9 10/28/2018     How often do you need to have someone help you when you read instructions, pamphlets, or other written materials from your doctor or pharmacy?: 1 - Never  Interpreter Needed?: No  Information entered by :: Verdie Saba, CMA   Activities of Daily Living     11/24/2023    8:10 AM 11/23/2023    8:54 PM  In your present state of health, do you have any difficulty performing the following activities:  Hearing? 1 1  Vision? 0 0  Difficulty concentrating or making decisions? 0 0  Walking or climbing stairs? 0 0  Dressing or bathing? 0 0  Doing errands, shopping? 0 0  Preparing Food and eating ? N N  Using the Toilet? N   In the past six months, have you accidently leaked urine? N N  Do you have problems with loss of bowel control? N N  Managing your Medications? N N  Managing your Finances? N N  Housekeeping or managing your Housekeeping? N N    Patient Care Team: Geofm Glade PARAS, MD as PCP - General (Internal Medicine) Cary Doffing, MD as Consulting Physician (Dermatology) Lida Francis LABOR, MD (Urology) Candise Lenis, MD as Referring  Physician (Ophthalmology) Cam Morene ORN, MD as Attending Physician (Urology)  I have updated your Care Teams any recent Medical Services you may have received from other providers in the past year.      Assessment:   This is a routine wellness examination for Aadit.  Hearing/Vision screen Hearing Screening - Comments:: Wears hearing aids Vision Screening - Comments:: Wears rx glasses - up to date with routine eye exams with Dr Alm Burden   Goals Addressed               This Visit's Progress     Patient Stated (pt-stated)        Patient stated he plans to continue living       Depression Screen     11/24/2023    8:12 AM 11/20/2022   10:26 AM 06/16/2022    8:49 AM 04/29/2022    3:48 PM 10/22/2021    2:51 PM 02/13/2021   10:30 AM 11/01/2020    4:10 PM  PHQ 2/9 Scores  PHQ - 2 Score 0 0 0 0 0 0 0  PHQ- 9 Score 0  0 0 0      Fall Risk     11/24/2023    8:11 AM 11/23/2023    8:54 PM 11/20/2022   10:23 AM 11/19/2022   11:33 PM 06/16/2022    8:49 AM  Fall Risk   Falls in the past year? 0 0 1 1 0  Number falls in past yr: 0 0 0 0 0  Injury with Fall? 0 0 1 1 0  Risk for fall due to : No Fall Risks  Impaired balance/gait  No Fall Risks  Follow up Falls evaluation completed;Falls prevention discussed  Falls evaluation completed  Falls evaluation completed    MEDICARE RISK AT HOME:  Medicare Risk at Home Any stairs in or around the home?: Yes If so, are there any without handrails?: No Home free of loose throw rugs in walkways, pet beds, electrical cords, etc?: No Adequate lighting in your home to reduce risk of falls?: Yes Life alert?: No Use of a cane, walker or w/c?: No Grab bars in the bathroom?: Yes Shower chair or bench in shower?: No Elevated toilet seat or a handicapped toilet?: No  TIMED UP AND GO:  Was the test performed?  No  Cognitive Function: 6CIT completed    05/26/2017   10:45 AM  MMSE - Mini Mental State Exam  Orientation to time 5  Orientation to Place 5  Registration 3  Attention/ Calculation 5  Recall 1  Language- name 2 objects 2  Language- repeat 1  Language- follow 3 step command 3  Language- read & follow direction 1  Write a  sentence 1  Copy design 1  Total score 28        11/24/2023    8:14 AM 11/20/2022   10:26 AM  6CIT Screen  What Year? 0 points 0 points  What month? 0 points 0 points  What time? 0 points 0 points  Count back from 20 0 points 0 points  Months in reverse 0 points 0 points  Repeat phrase 0 points 0 points  Total Score 0 points 0 points    Immunizations Immunization History  Administered Date(s) Administered   Fluad Quad(high Dose 65+) 10/28/2018   H1N1 04/12/2008   INFLUENZA, HIGH DOSE SEASONAL PF 12/20/2012, 12/02/2015, 11/11/2017   Influenza Split 12/16/2010, 01/06/2012   Influenza Whole 02/03/2007, 12/21/2007, 12/05/2009   Influenza,inj,Quad PF,6+ Mos  12/07/2013   Influenza-Unspecified 12/07/2013, 11/26/2017, 12/11/2020   PFIZER(Purple Top)SARS-COV-2 Vaccination 05/01/2019, 05/25/2019, 06/01/2019   Pneumococcal Conjugate-13 08/20/2015   Pneumococcal Polysaccharide-23 03/09/2002, 12/28/2012   Td 03/02/2021   Tetanus 02/22/2011   Varicella 07/21/2005   Zoster, Live 04/25/2006    Screening Tests Health Maintenance  Topic Date Due   Zoster Vaccines- Shingrix (1 of 2) 07/07/1959   Influenza Vaccine  10/01/2023   COVID-19 Vaccine (4 - 2025-26 season) 11/01/2023   Medicare Annual Wellness (AWV)  11/23/2024   Pneumococcal Vaccine: 50+ Years  Completed   HPV VACCINES  Aged Out   Meningococcal B Vaccine  Aged Out   DTaP/Tdap/Td  Discontinued    Health Maintenance Items Addressed: 11/24/2023  Additional Screening:  Vision Screening: Recommended annual ophthalmology exams for early detection of glaucoma and other disorders of the eye. Is the patient up to date with their annual eye exam?  Yes  Who is the provider or what is the name of the office in which the patient attends annual eye exams? [facility]  Dental Screening: Recommended annual dental exams for proper oral hygiene  Community Resource Referral / Chronic Care Management: CRR required this visit?  No    CCM required this visit?  No   Plan:    I have personally reviewed and noted the following in the patient's chart:   Medical and social history Use of alcohol, tobacco or illicit drugs  Current medications and supplements including opioid prescriptions. Patient is not currently taking opioid prescriptions. Functional ability and status Nutritional status Physical activity Advanced directives List of other physicians Hospitalizations, surgeries, and ER visits in previous 12 months Vitals Screenings to include cognitive, depression, and falls Referrals and appointments  In addition, I have reviewed and discussed with patient certain preventive protocols, quality metrics, and best practice recommendations. A written personalized care plan for preventive services as well as general preventive health recommendations were provided to patient.   Verdie CHRISTELLA Saba, CMA   11/24/2023   After Visit Summary: (In Person-Declined) Patient declined AVS at this time.  Notes: Scheduled 1-yr Physical w/PCP for 03/2024.

## 2023-12-26 ENCOUNTER — Other Ambulatory Visit: Payer: Self-pay | Admitting: Internal Medicine

## 2024-01-06 DIAGNOSIS — Z23 Encounter for immunization: Secondary | ICD-10-CM | POA: Diagnosis not present

## 2024-03-14 NOTE — Patient Instructions (Addendum)
" ° ° ° ° °  Blood work was ordered.       Medications changes include :   None    A referral was ordered and someone will call you to schedule an appointment.     Return in about 1 year (around 03/15/2025) for follow up.  "

## 2024-03-14 NOTE — [place] Notes (Unsigned)
 "     Subjective:    Patient ID: Adrian Rios, male    DOB: [birth year], 84 y.o.   MRN: [id number]     HPI Adrian Rios is here for follow up of his chronic medical problems.  Recurrent styes.  Improved with hot compresses.  Not exercising regularly -- starting on tai chi couple of weeks ago..  Overall doing well and has no complaints.  Medications and allergies reviewed with patient and updated if appropriate.  Medications Ordered Prior to Encounter[1]   Review of Systems  Constitutional:  Negative for fever.  Respiratory:  Positive for shortness of breath (only with strenuous activity). Negative for cough and wheezing.   Cardiovascular:  Positive for leg swelling (mild at times). Negative for chest pain and palpitations.  Gastrointestinal:  Negative for abdominal pain, blood in stool, constipation and diarrhea.       GERD rare  Musculoskeletal:  Positive for neck pain.  Skin:  Negative for rash.  Neurological:  Positive for headaches. Negative for light-headedness.       Objective:   Vitals:   03/15/24 1301  BP: 120/72  Pulse: 78  Temp: 98 F (36.7 C)  SpO2: 97%   BP Readings from Last 3 Encounters:  03/15/24 120/72  11/24/23 120/62  06/03/23 106/71   Wt Readings from Last 3 Encounters:  03/15/24 165 lb (74.8 kg)  11/24/23 167 lb 3.2 oz (75.8 kg)  06/03/23 166 lb (75.3 kg)   Body mass index is 26.63 kg/m.    Physical Exam Constitutional:      General: He is not in acute distress.    Appearance: Normal appearance. He is not ill-appearing.  HENT:     Head: Normocephalic and atraumatic.  Eyes:     Conjunctiva/sclera: Conjunctivae normal.  Cardiovascular:     Rate and Rhythm: Normal rate and regular rhythm.     Heart sounds: Normal heart sounds.  Pulmonary:     Effort: Pulmonary effort is normal. No respiratory distress.     Breath sounds: Normal breath sounds. No wheezing or rales.  Abdominal:     General: There is no distension.     Palpations:  Abdomen is soft.     Tenderness: There is no abdominal tenderness.  Musculoskeletal:     Right lower leg: No edema.     Left lower leg: No edema.  Skin:    General: Skin is warm and dry.     Findings: No rash.  Neurological:     Mental Status: He is alert. Mental status is at baseline.  Psychiatric:        Mood and Affect: Mood normal.        Lab Results  Component Value Date   WBC 10.0 04/22/2022   HGB 11.1 (L) 04/22/2022   HCT 33.8 (L) 04/22/2022   PLT 246 04/22/2022   GLUCOSE 110 (H) 04/22/2022   CHOL 146 08/02/2020   TRIG 130.0 08/02/2020   HDL 41.50 08/02/2020   LDLCALC 79 08/02/2020   ALT 27 04/20/2022   AST 26 04/20/2022   NA 138 04/22/2022   K 3.6 04/22/2022   CL 107 04/22/2022   CREATININE 1.06 04/22/2022   BUN 14 04/22/2022   CO2 23 04/22/2022   TSH 1.17 10/28/2018   PSA 0.29 07/11/2009   INR 1.1 04/19/2022   HGBA1C 5.7 06/25/2021   MICROALBUR 0.3 06/29/2006     Assessment & Plan:    See Problem List for Assessment and Plan of chronic medical problems.        [  1]  Current Outpatient Medications on File Prior to Visit  Medication Sig Dispense Refill   acetaminophen  (TYLENOL ) 500 MG tablet Take 1,000 mg by mouth as needed for headache (pain).     albuterol  (VENTOLIN  HFA) 108 (90 Base) MCG/ACT inhaler Inhale 2 puffs into the lungs every 6 (six) hours as needed for wheezing or shortness of breath. (Patient taking differently: Inhale 1-2 puffs into the lungs every 6 (six) hours as needed for wheezing or shortness of breath.) 1 each 8   Ascorbic Acid (VITAMIN C) 1000 MG tablet Take 1,000 mg by mouth daily.     cholecalciferol (VITAMIN D3) 25 MCG (1000 UNIT) tablet Take 1,000 Units by mouth daily.     Cinnamon 500 MG capsule Take 500 mg by mouth daily.     fluticasone -salmeterol (ADVAIR ) 250-50 MCG/ACT AEPB INHALE 1 PUFF INTO THE LUNGS EVERY 12 HOURS 60 each 5   latanoprost  (XALATAN ) 0.005 % ophthalmic solution Place 1 drop into the right eye at  bedtime.     magnesium  oxide (MAG-OX) 400 MG tablet Take 400 mg by mouth daily with supper.     meclizine  (ANTIVERT ) 12.5 MG tablet Take 1-2 tablets (12.5-25 mg total) by mouth 3 (three) times daily as needed for dizziness. 30 tablet 0   methenamine  (HIPREX ) 1 g tablet Take 1 tablet (1 g total) by mouth 2 (two) times daily with a meal.     Misc Natural Products (GLUCOSAMINE CHOND DOUBLE STR PO) Take 1 tablet by mouth 2 (two) times daily.     montelukast  (SINGULAIR ) 10 MG tablet TAKE 1 TABLET BY MOUTH AT BEDTIME 90 tablet 3   triamcinolone  (NASACORT ) 55 MCG/ACT AERO nasal inhaler Place 2 sprays into the nose at bedtime as needed (allergies).     venlafaxine  (EFFEXOR ) 75 MG tablet Take 1.5 tablets (112.5 mg total) by mouth daily. 135 tablet 1   No current facility-administered medications on file prior to visit.   "

## 2024-03-15 ENCOUNTER — Ambulatory Visit (INDEPENDENT_AMBULATORY_CARE_PROVIDER_SITE_OTHER): Admitting: Internal Medicine

## 2024-03-15 VITALS — BP 120/72 | HR 78 | Temp 98.0°F | Ht 66.0 in | Wt 165.0 lb

## 2024-03-15 DIAGNOSIS — Z936 Other artificial openings of urinary tract status: Secondary | ICD-10-CM

## 2024-03-15 DIAGNOSIS — R7303 Prediabetes: Secondary | ICD-10-CM | POA: Diagnosis not present

## 2024-03-15 DIAGNOSIS — J4520 Mild intermittent asthma, uncomplicated: Secondary | ICD-10-CM

## 2024-03-15 DIAGNOSIS — F3289 Other specified depressive episodes: Secondary | ICD-10-CM | POA: Diagnosis not present

## 2024-03-15 DIAGNOSIS — E7800 Pure hypercholesterolemia, unspecified: Secondary | ICD-10-CM | POA: Diagnosis not present

## 2024-03-15 DIAGNOSIS — N1831 Chronic kidney disease, stage 3a: Secondary | ICD-10-CM

## 2024-03-15 LAB — COMPREHENSIVE METABOLIC PANEL WITH GFR
ALT: 12 U/L (ref 3–53)
AST: 16 U/L (ref 5–37)
Albumin: 3.9 g/dL (ref 3.5–5.2)
Alkaline Phosphatase: 94 U/L (ref 39–117)
BUN: 19 mg/dL (ref 6–23)
CO2: 26 meq/L (ref 19–32)
Calcium: 9 mg/dL (ref 8.4–10.5)
Chloride: 108 meq/L (ref 96–112)
Creatinine, Ser: 1.07 mg/dL (ref 0.40–1.50)
GFR: 64.09 mL/min
Glucose, Bld: 97 mg/dL (ref 70–99)
Potassium: 4.4 meq/L (ref 3.5–5.1)
Sodium: 139 meq/L (ref 135–145)
Total Bilirubin: 0.4 mg/dL (ref 0.2–1.2)
Total Protein: 7.1 g/dL (ref 6.0–8.3)

## 2024-03-15 LAB — VITAMIN D 25 HYDROXY (VIT D DEFICIENCY, FRACTURES): VITD: 62.68 ng/mL (ref 30.00–100.00)

## 2024-03-15 LAB — LIPID PANEL
Cholesterol: 140 mg/dL (ref 28–200)
HDL: 40.6 mg/dL
LDL Cholesterol: 69 mg/dL (ref 10–99)
NonHDL: 99.25
Total CHOL/HDL Ratio: 3
Triglycerides: 153 mg/dL — ABNORMAL HIGH (ref 10.0–149.0)
VLDL: 30.6 mg/dL (ref 0.0–40.0)

## 2024-03-15 LAB — CBC
HCT: 39.5 % (ref 39.0–52.0)
Hemoglobin: 13.4 g/dL (ref 13.0–17.0)
MCHC: 33.9 g/dL (ref 30.0–36.0)
MCV: 94.3 fl (ref 78.0–100.0)
Platelets: 308 K/uL (ref 150.0–400.0)
RBC: 4.19 Mil/uL — ABNORMAL LOW (ref 4.22–5.81)
RDW: 12.6 % (ref 11.5–15.5)
WBC: 5.9 K/uL (ref 4.0–10.5)

## 2024-03-15 LAB — TSH: TSH: 1.15 u[IU]/mL (ref 0.35–5.50)

## 2024-03-15 LAB — HEMOGLOBIN A1C: Hgb A1c MFr Bld: 5.8 % (ref 4.6–6.5)

## 2024-03-15 NOTE — Assessment & Plan Note (Signed)
Chronic Controlled Continue Advair 250-50 once daily, Singulair 10 mg daily Continue albuterol as needed

## 2024-03-15 NOTE — Assessment & Plan Note (Addendum)
 Chronic Check lipid panel, TSH Lifestyle controlled Encouraged regular exercise and healthy diet

## 2024-03-15 NOTE — Assessment & Plan Note (Signed)
 Chronic Stage 3a Stable CMP, CBC, vitamin d  level

## 2024-03-15 NOTE — Assessment & Plan Note (Signed)
 Chronic Lab Results  Component Value Date   HGBA1C 5.7 06/25/2021   Check a1c Low sugar/carbohydrate diet Encouraged regular exercise

## 2024-03-15 NOTE — Assessment & Plan Note (Signed)
 Chronic Functioning well Follows with urology annually

## 2024-03-15 NOTE — Assessment & Plan Note (Signed)
Chronic Controlled, Stable Continue Effexor 112.5 mg daily

## 2024-03-16 ENCOUNTER — Ambulatory Visit: Payer: Self-pay | Admitting: Internal Medicine

## 2024-11-24 ENCOUNTER — Ambulatory Visit
